# Patient Record
Sex: Female | Born: 1935 | Race: White | Hispanic: No | Smoking: Former smoker
Health system: Southern US, Community
[De-identification: ages and names within clinical notes are randomized; demographics above are authoritative.]

## PROBLEM LIST (undated history)

## (undated) DIAGNOSIS — N183 Chronic kidney disease, stage 3 unspecified: Secondary | ICD-10-CM

## (undated) DIAGNOSIS — F419 Anxiety disorder, unspecified: Secondary | ICD-10-CM

## (undated) DIAGNOSIS — M797 Fibromyalgia: Secondary | ICD-10-CM

## (undated) DIAGNOSIS — R570 Cardiogenic shock: Secondary | ICD-10-CM

## (undated) DIAGNOSIS — E785 Hyperlipidemia, unspecified: Secondary | ICD-10-CM

## (undated) DIAGNOSIS — K219 Gastro-esophageal reflux disease without esophagitis: Secondary | ICD-10-CM

## (undated) DIAGNOSIS — K449 Diaphragmatic hernia without obstruction or gangrene: Secondary | ICD-10-CM

## (undated) DIAGNOSIS — K589 Irritable bowel syndrome without diarrhea: Secondary | ICD-10-CM

## (undated) DIAGNOSIS — D72829 Elevated white blood cell count, unspecified: Secondary | ICD-10-CM

## (undated) DIAGNOSIS — D649 Anemia, unspecified: Secondary | ICD-10-CM

## (undated) DIAGNOSIS — I350 Nonrheumatic aortic (valve) stenosis: Secondary | ICD-10-CM

## (undated) DIAGNOSIS — E079 Disorder of thyroid, unspecified: Secondary | ICD-10-CM

## (undated) DIAGNOSIS — I5042 Chronic combined systolic (congestive) and diastolic (congestive) heart failure: Secondary | ICD-10-CM

## (undated) DIAGNOSIS — K5730 Diverticulosis of large intestine without perforation or abscess without bleeding: Secondary | ICD-10-CM

## (undated) DIAGNOSIS — Z515 Encounter for palliative care: Secondary | ICD-10-CM

## (undated) DIAGNOSIS — E1149 Type 2 diabetes mellitus with other diabetic neurological complication: Secondary | ICD-10-CM

## (undated) DIAGNOSIS — I2510 Atherosclerotic heart disease of native coronary artery without angina pectoris: Secondary | ICD-10-CM

## (undated) DIAGNOSIS — Z86010 Personal history of colonic polyps: Secondary | ICD-10-CM

## (undated) DIAGNOSIS — K648 Other hemorrhoids: Secondary | ICD-10-CM

## (undated) HISTORY — DX: Nonrheumatic aortic (valve) stenosis: I35.0

## (undated) HISTORY — DX: Disorder of thyroid, unspecified: E07.9

## (undated) HISTORY — DX: Diverticulosis of large intestine without perforation or abscess without bleeding: K57.30

## (undated) HISTORY — DX: Other hemorrhoids: K64.8

## (undated) HISTORY — PX: CATARACT EXTRACTION: SUR2

## (undated) HISTORY — DX: Type 2 diabetes mellitus with other diabetic neurological complication: E11.49

## (undated) HISTORY — DX: Irritable bowel syndrome without diarrhea: K58.9

## (undated) HISTORY — DX: Chronic combined systolic (congestive) and diastolic (congestive) heart failure: I50.42

## (undated) HISTORY — DX: Gastro-esophageal reflux disease without esophagitis: K21.9

## (undated) HISTORY — PX: THYROIDECTOMY, PARTIAL: SHX18

## (undated) HISTORY — DX: Hyperlipidemia, unspecified: E78.5

## (undated) HISTORY — DX: Atherosclerotic heart disease of native coronary artery without angina pectoris: I25.10

## (undated) HISTORY — DX: Personal history of colonic polyps: Z86.010

## (undated) HISTORY — DX: Diaphragmatic hernia without obstruction or gangrene: K44.9

## (undated) HISTORY — PX: ABDOMINAL HYSTERECTOMY: SHX81

## (undated) HISTORY — PX: HERNIA REPAIR: SHX51

## (undated) HISTORY — DX: Anxiety disorder, unspecified: F41.9

---

## 1999-02-22 ENCOUNTER — Encounter: Admission: RE | Admit: 1999-02-22 | Discharge: 1999-05-23 | Payer: Self-pay | Admitting: Family Medicine

## 1999-03-01 ENCOUNTER — Ambulatory Visit (HOSPITAL_COMMUNITY): Admission: RE | Admit: 1999-03-01 | Discharge: 1999-03-01 | Payer: Self-pay | Admitting: Family Medicine

## 1999-09-23 ENCOUNTER — Encounter: Admission: RE | Admit: 1999-09-23 | Discharge: 1999-12-22 | Payer: Self-pay | Admitting: Family Medicine

## 2000-01-27 ENCOUNTER — Ambulatory Visit (HOSPITAL_COMMUNITY): Admission: RE | Admit: 2000-01-27 | Discharge: 2000-01-27 | Payer: Self-pay | Admitting: Internal Medicine

## 2005-04-11 HISTORY — PX: ESOPHAGOGASTRODUODENOSCOPY: SHX1529

## 2005-04-11 HISTORY — PX: OTHER SURGICAL HISTORY: SHX169

## 2005-07-01 ENCOUNTER — Encounter: Admission: RE | Admit: 2005-07-01 | Discharge: 2005-07-01 | Payer: Self-pay | Admitting: Family Medicine

## 2006-01-06 ENCOUNTER — Ambulatory Visit: Payer: Self-pay | Admitting: Family Medicine

## 2006-01-12 ENCOUNTER — Ambulatory Visit: Payer: Self-pay | Admitting: Internal Medicine

## 2006-01-25 ENCOUNTER — Encounter (INDEPENDENT_AMBULATORY_CARE_PROVIDER_SITE_OTHER): Payer: Self-pay | Admitting: Specialist

## 2006-01-25 ENCOUNTER — Ambulatory Visit: Payer: Self-pay | Admitting: Internal Medicine

## 2006-02-27 ENCOUNTER — Ambulatory Visit: Payer: Self-pay | Admitting: Internal Medicine

## 2006-03-06 ENCOUNTER — Ambulatory Visit: Payer: Self-pay | Admitting: Family Medicine

## 2006-03-06 LAB — CONVERTED CEMR LAB
ALT: 24 units/L (ref 0–40)
Albumin: 3.9 g/dL (ref 3.5–5.2)
Alkaline Phosphatase: 108 units/L (ref 39–117)
Total Bilirubin: 1 mg/dL (ref 0.3–1.2)

## 2006-06-16 ENCOUNTER — Ambulatory Visit: Payer: Self-pay | Admitting: Family Medicine

## 2006-06-16 LAB — CONVERTED CEMR LAB
ALT: 23 units/L (ref 0–40)
AST: 24 units/L (ref 0–37)
Bilirubin, Direct: 0.2 mg/dL (ref 0.0–0.3)
CO2: 26 meq/L (ref 19–32)
Calcium: 9.4 mg/dL (ref 8.4–10.5)
Creatinine,U: 133.8 mg/dL
GFR calc Af Amer: 126 mL/min
Glucose, Bld: 137 mg/dL — ABNORMAL HIGH (ref 70–99)
Sodium: 138 meq/L (ref 135–145)
Total Protein: 7.5 g/dL (ref 6.0–8.3)

## 2006-06-19 ENCOUNTER — Encounter: Payer: Self-pay | Admitting: Family Medicine

## 2006-09-25 DIAGNOSIS — K219 Gastro-esophageal reflux disease without esophagitis: Secondary | ICD-10-CM

## 2006-09-25 DIAGNOSIS — E1149 Type 2 diabetes mellitus with other diabetic neurological complication: Secondary | ICD-10-CM | POA: Insufficient documentation

## 2006-09-25 DIAGNOSIS — K449 Diaphragmatic hernia without obstruction or gangrene: Secondary | ICD-10-CM | POA: Insufficient documentation

## 2006-09-27 ENCOUNTER — Ambulatory Visit: Payer: Self-pay | Admitting: Family Medicine

## 2006-09-27 DIAGNOSIS — R21 Rash and other nonspecific skin eruption: Secondary | ICD-10-CM

## 2006-09-28 ENCOUNTER — Ambulatory Visit: Payer: Self-pay | Admitting: Family Medicine

## 2006-10-03 ENCOUNTER — Encounter: Payer: Self-pay | Admitting: Family Medicine

## 2006-10-12 ENCOUNTER — Telehealth (INDEPENDENT_AMBULATORY_CARE_PROVIDER_SITE_OTHER): Payer: Self-pay | Admitting: *Deleted

## 2007-01-05 ENCOUNTER — Ambulatory Visit: Payer: Self-pay | Admitting: Family Medicine

## 2007-01-08 ENCOUNTER — Encounter: Payer: Self-pay | Admitting: Family Medicine

## 2007-04-12 DIAGNOSIS — K449 Diaphragmatic hernia without obstruction or gangrene: Secondary | ICD-10-CM

## 2007-04-12 DIAGNOSIS — K648 Other hemorrhoids: Secondary | ICD-10-CM

## 2007-04-12 DIAGNOSIS — Z86010 Personal history of colonic polyps: Secondary | ICD-10-CM

## 2007-04-12 DIAGNOSIS — K5730 Diverticulosis of large intestine without perforation or abscess without bleeding: Secondary | ICD-10-CM

## 2007-04-12 HISTORY — DX: Diaphragmatic hernia without obstruction or gangrene: K44.9

## 2007-04-12 HISTORY — PX: LAPAROSCOPIC NISSEN FUNDOPLICATION: SHX1932

## 2007-04-12 HISTORY — DX: Other hemorrhoids: K64.8

## 2007-04-12 HISTORY — DX: Personal history of colonic polyps: Z86.010

## 2007-04-12 HISTORY — DX: Diverticulosis of large intestine without perforation or abscess without bleeding: K57.30

## 2007-04-13 ENCOUNTER — Ambulatory Visit: Payer: Self-pay | Admitting: Family Medicine

## 2007-04-24 ENCOUNTER — Telehealth (INDEPENDENT_AMBULATORY_CARE_PROVIDER_SITE_OTHER): Payer: Self-pay | Admitting: *Deleted

## 2007-04-28 LAB — CONVERTED CEMR LAB
Albumin: 4.2 g/dL (ref 3.5–5.2)
Alkaline Phosphatase: 68 units/L (ref 39–117)
BUN: 23 mg/dL (ref 6–23)
Creatinine, Ser: 0.9 mg/dL (ref 0.4–1.2)
GFR calc Af Amer: 78 mL/min
GFR calc non Af Amer: 65 mL/min
Potassium: 4.7 meq/L (ref 3.5–5.1)
Triglycerides: 139 mg/dL (ref 0–149)
VLDL: 28 mg/dL (ref 0–40)

## 2007-05-11 ENCOUNTER — Ambulatory Visit: Payer: Self-pay | Admitting: Family Medicine

## 2007-05-11 ENCOUNTER — Telehealth: Payer: Self-pay | Admitting: Family Medicine

## 2007-05-11 DIAGNOSIS — L84 Corns and callosities: Secondary | ICD-10-CM | POA: Insufficient documentation

## 2007-05-11 DIAGNOSIS — R1013 Epigastric pain: Secondary | ICD-10-CM

## 2007-05-11 DIAGNOSIS — E1142 Type 2 diabetes mellitus with diabetic polyneuropathy: Secondary | ICD-10-CM

## 2007-05-15 ENCOUNTER — Telehealth (INDEPENDENT_AMBULATORY_CARE_PROVIDER_SITE_OTHER): Payer: Self-pay | Admitting: *Deleted

## 2007-05-17 ENCOUNTER — Telehealth (INDEPENDENT_AMBULATORY_CARE_PROVIDER_SITE_OTHER): Payer: Self-pay | Admitting: *Deleted

## 2007-05-23 ENCOUNTER — Encounter: Payer: Self-pay | Admitting: Family Medicine

## 2007-05-31 ENCOUNTER — Emergency Department (HOSPITAL_COMMUNITY): Admission: EM | Admit: 2007-05-31 | Discharge: 2007-05-31 | Payer: Self-pay | Admitting: Emergency Medicine

## 2007-05-31 ENCOUNTER — Telehealth (INDEPENDENT_AMBULATORY_CARE_PROVIDER_SITE_OTHER): Payer: Self-pay | Admitting: *Deleted

## 2007-06-13 ENCOUNTER — Ambulatory Visit: Payer: Self-pay | Admitting: Internal Medicine

## 2007-06-15 ENCOUNTER — Encounter: Payer: Self-pay | Admitting: Family Medicine

## 2007-06-15 ENCOUNTER — Ambulatory Visit: Payer: Self-pay | Admitting: Internal Medicine

## 2007-06-15 ENCOUNTER — Encounter: Payer: Self-pay | Admitting: Internal Medicine

## 2007-06-20 ENCOUNTER — Ambulatory Visit (HOSPITAL_COMMUNITY): Admission: RE | Admit: 2007-06-20 | Discharge: 2007-06-20 | Payer: Self-pay | Admitting: Internal Medicine

## 2007-06-28 ENCOUNTER — Telehealth (INDEPENDENT_AMBULATORY_CARE_PROVIDER_SITE_OTHER): Payer: Self-pay | Admitting: *Deleted

## 2007-07-02 ENCOUNTER — Ambulatory Visit (HOSPITAL_COMMUNITY): Admission: RE | Admit: 2007-07-02 | Discharge: 2007-07-02 | Payer: Self-pay | Admitting: Internal Medicine

## 2007-07-04 ENCOUNTER — Encounter: Admission: RE | Admit: 2007-07-04 | Discharge: 2007-07-04 | Payer: Self-pay | Admitting: Podiatry

## 2007-07-11 ENCOUNTER — Encounter: Admission: RE | Admit: 2007-07-11 | Discharge: 2007-07-11 | Payer: Self-pay | Admitting: Interventional Radiology

## 2007-07-23 ENCOUNTER — Telehealth (INDEPENDENT_AMBULATORY_CARE_PROVIDER_SITE_OTHER): Payer: Self-pay | Admitting: *Deleted

## 2007-08-06 ENCOUNTER — Ambulatory Visit: Payer: Self-pay | Admitting: Internal Medicine

## 2007-09-24 ENCOUNTER — Ambulatory Visit: Payer: Self-pay | Admitting: Family Medicine

## 2007-09-26 ENCOUNTER — Encounter: Payer: Self-pay | Admitting: Family Medicine

## 2007-09-26 LAB — CONVERTED CEMR LAB
Albumin: 3.9 g/dL (ref 3.5–5.2)
Cholesterol: 247 mg/dL (ref 0–200)
Creatinine,U: 143.4 mg/dL
HDL: 34.6 mg/dL — ABNORMAL LOW (ref >39.0)
Hgb A1c MFr Bld: 7 % — ABNORMAL HIGH (ref 4.6–6.0)
Microalb Creat Ratio: 24.4 mg/g (ref 0.0–30.0)
Total CHOL/HDL Ratio: 7.1
Triglycerides: 287 mg/dL (ref 0–149)
VLDL: 57 mg/dL — ABNORMAL HIGH (ref 0–40)

## 2007-09-27 ENCOUNTER — Encounter (INDEPENDENT_AMBULATORY_CARE_PROVIDER_SITE_OTHER): Payer: Self-pay | Admitting: *Deleted

## 2007-10-03 ENCOUNTER — Telehealth (INDEPENDENT_AMBULATORY_CARE_PROVIDER_SITE_OTHER): Payer: Self-pay | Admitting: *Deleted

## 2007-10-03 ENCOUNTER — Telehealth: Payer: Self-pay | Admitting: Family Medicine

## 2007-10-08 ENCOUNTER — Ambulatory Visit: Payer: Self-pay | Admitting: Family Medicine

## 2007-10-08 DIAGNOSIS — E785 Hyperlipidemia, unspecified: Secondary | ICD-10-CM

## 2007-10-23 ENCOUNTER — Telehealth (INDEPENDENT_AMBULATORY_CARE_PROVIDER_SITE_OTHER): Payer: Self-pay | Admitting: *Deleted

## 2007-10-23 ENCOUNTER — Encounter: Payer: Self-pay | Admitting: Family Medicine

## 2007-10-25 ENCOUNTER — Telehealth: Payer: Self-pay | Admitting: Internal Medicine

## 2007-11-06 ENCOUNTER — Encounter: Admission: RE | Admit: 2007-11-06 | Discharge: 2007-11-06 | Payer: Self-pay | Admitting: Interventional Radiology

## 2007-11-09 ENCOUNTER — Ambulatory Visit: Payer: Self-pay | Admitting: Family Medicine

## 2007-11-28 ENCOUNTER — Ambulatory Visit: Payer: Self-pay | Admitting: Vascular Surgery

## 2007-11-29 ENCOUNTER — Encounter: Payer: Self-pay | Admitting: Family Medicine

## 2008-02-01 ENCOUNTER — Inpatient Hospital Stay (HOSPITAL_COMMUNITY): Admission: RE | Admit: 2008-02-01 | Discharge: 2008-02-06 | Payer: Self-pay | Admitting: Surgery

## 2008-05-08 ENCOUNTER — Encounter: Payer: Self-pay | Admitting: Family Medicine

## 2008-06-18 ENCOUNTER — Ambulatory Visit: Payer: Self-pay | Admitting: Vascular Surgery

## 2008-10-15 ENCOUNTER — Ambulatory Visit: Payer: Self-pay | Admitting: Vascular Surgery

## 2009-01-28 ENCOUNTER — Ambulatory Visit: Payer: Self-pay | Admitting: Vascular Surgery

## 2009-01-30 ENCOUNTER — Encounter: Payer: Self-pay | Admitting: Family Medicine

## 2009-07-29 ENCOUNTER — Ambulatory Visit: Payer: Self-pay | Admitting: Vascular Surgery

## 2009-08-11 ENCOUNTER — Ambulatory Visit: Payer: Self-pay | Admitting: Family Medicine

## 2009-08-11 LAB — HM COLONOSCOPY

## 2009-08-11 LAB — HM DIABETES FOOT EXAM

## 2009-08-13 ENCOUNTER — Encounter: Payer: Self-pay | Admitting: Family Medicine

## 2009-09-28 ENCOUNTER — Telehealth: Payer: Self-pay | Admitting: Family Medicine

## 2009-11-25 ENCOUNTER — Telehealth (INDEPENDENT_AMBULATORY_CARE_PROVIDER_SITE_OTHER): Payer: Self-pay | Admitting: *Deleted

## 2009-11-30 ENCOUNTER — Ambulatory Visit: Payer: Self-pay | Admitting: Family Medicine

## 2009-12-11 ENCOUNTER — Ambulatory Visit: Payer: Self-pay | Admitting: Family Medicine

## 2009-12-29 ENCOUNTER — Encounter: Payer: Self-pay | Admitting: Family Medicine

## 2009-12-29 ENCOUNTER — Telehealth: Payer: Self-pay | Admitting: Family Medicine

## 2010-02-04 ENCOUNTER — Encounter: Payer: Self-pay | Admitting: Family Medicine

## 2010-03-12 ENCOUNTER — Ambulatory Visit: Payer: Self-pay | Admitting: Vascular Surgery

## 2010-05-09 LAB — CONVERTED CEMR LAB
ALT: 13 units/L (ref 0–35)
ALT: 20 units/L (ref 0–35)
ALT: 23 units/L (ref 0–35)
AST: 16 units/L (ref 0–37)
AST: 17 units/L (ref 0–37)
AST: 22 units/L (ref 0–37)
Albumin: 3.7 g/dL (ref 3.5–5.2)
Alkaline Phosphatase: 115 units/L (ref 39–117)
Alkaline Phosphatase: 71 units/L (ref 39–117)
BUN: 11 mg/dL (ref 6–23)
BUN: 9 mg/dL (ref 6–23)
Basophils Absolute: 0 10*3/uL (ref 0.0–0.1)
Basophils Absolute: 0.1 10*3/uL (ref 0.0–0.1)
Bilirubin, Direct: 0.1 mg/dL (ref 0.0–0.3)
Bilirubin, Direct: 0.1 mg/dL (ref 0.0–0.3)
Bilirubin, Direct: 0.1 mg/dL (ref 0.0–0.3)
Blood in Urine, dipstick: NEGATIVE
CO2: 28 meq/L (ref 19–32)
CO2: 28 meq/L (ref 19–32)
Calcium: 9.2 mg/dL (ref 8.4–10.5)
Calcium: 9.3 mg/dL (ref 8.4–10.5)
Chloride: 102 meq/L (ref 96–112)
Chloride: 106 meq/L (ref 96–112)
Direct LDL: 111.8 mg/dL
Eosinophils Absolute: 0.2 10*3/uL (ref 0.0–0.6)
Eosinophils Relative: 1.8 % (ref 0.0–5.0)
GFR calc Af Amer: 105 mL/min
GFR calc non Af Amer: 127 mL/min (ref >60)
GFR calc non Af Amer: 75 mL/min
GFR calc non Af Amer: 87 mL/min
Glucose, Bld: 153 mg/dL — ABNORMAL HIGH (ref 70–99)
HCT: 40.9 % (ref 36.0–46.0)
HDL: 40.8 mg/dL (ref >39.00)
Hemoglobin: 13.5 g/dL (ref 12.0–15.0)
Hgb A1c MFr Bld: 10.3 % — ABNORMAL HIGH (ref 4.6–6.5)
Hgb A1c MFr Bld: 6.7 % — ABNORMAL HIGH (ref 4.6–6.5)
Ketones, urine, test strip: NEGATIVE
Lymphocytes Relative: 26.1 % (ref 12.0–46.0)
Lymphocytes Relative: 35.2 % (ref 12.0–46.0)
Lymphs Abs: 2.6 10*3/uL (ref 0.7–4.0)
MCHC: 33.2 g/dL (ref 30.0–36.0)
Microalb Creat Ratio: 93.8 mg/g — ABNORMAL HIGH (ref 0.0–30.0)
Microalb, Ur: 2.6 mg/dL — ABNORMAL HIGH (ref 0.0–1.9)
Monocytes Absolute: 0.6 10*3/uL (ref 0.2–0.7)
Monocytes Relative: 7.7 % (ref 3.0–11.0)
Monocytes Relative: 8.3 % (ref 3.0–12.0)
Neutro Abs: 5.1 10*3/uL (ref 1.4–7.7)
Nitrite: NEGATIVE
Platelets: 275 10*3/uL (ref 150.0–400.0)
Potassium: 4.1 meq/L (ref 3.5–5.1)
Potassium: 4.6 meq/L (ref 3.5–5.1)
Potassium: 4.7 meq/L (ref 3.5–5.1)
Protein, U semiquant: NEGATIVE
RDW: 14.5 % (ref 11.5–14.6)
Sodium: 138 meq/L (ref 135–145)
Sodium: 138 meq/L (ref 135–145)
Total Bilirubin: 0.4 mg/dL (ref 0.3–1.2)
Total Bilirubin: 0.6 mg/dL (ref 0.3–1.2)
Total Protein: 7.1 g/dL (ref 6.0–8.3)
Triglycerides: 527 mg/dL — ABNORMAL HIGH (ref 0.0–149.0)
VLDL: 105.4 mg/dL — ABNORMAL HIGH (ref 0.0–40.0)
WBC: 7.5 10*3/uL (ref 4.5–10.5)
pH: 7

## 2010-05-11 NOTE — Progress Notes (Signed)
Summary: FENOFIBRATE  Phone Note Call from Patient   Caller: Patient Summary of Call: PT STATES FENOFIBRATE GIVES HER A HEADACHE SO SHE IS UNABLE TO TAKE IT. PLEASE ADVISE. [phone]. Initial call taken by: Lavell Islam,  December 29, 2009 3:58 PM  Follow-up for Phone Call        lovaza 1 g 2 by mouth two times a day #120 2 refills or she can try otc fish oil 4 g a day Follow-up by: Loreen Freud DO,  December 29, 2009 4:42 PM  Additional Follow-up for Phone Call Additional follow up Details #1::        spk with pt and she would rather take the Fish oil pills 4g a day. Rx not called to the pharmacy. Additional Follow-up by: Almeta Monas CMA Duncan Dull),  December 29, 2009 5:03 PM    New/Updated Medications: GNP FISH OIL 435 MG CAPS (OMEGA-3 FATTY ACIDS)

## 2010-05-11 NOTE — Assessment & Plan Note (Signed)
Summary: CLEARANCE FOR SURGERY FOR HER EYES--PH   Vital Signs:  Patient profile:   77 year old female Height:      64 inches Weight:      131 pounds BMI:     22.57 Pulse rate:   76 / minute Pulse rhythm:   regular BP sitting:   126 / 78  (left arm) Cuff size:   regular  Vitals Entered By: Army Fossa CMA (Aug 11, 2009 8:21 AM) CC: Pt here for Clearance for Surgery- she States she stopped every medication a long time ago. They caused her hair to fall out, and joints to hurt., Pre-op Evaluation CBG Result 256   History of Present Illness:  Pre-op Evaluation      I was asked to see this delightful patient today for Pre-op Evaluation.  Pt here to have presurgical clearance for cataract surgery with [facility].  The patient denies respiratory symptoms, GI bleeding, chest pain, edema, PND, heavy ETOH use, and smoking.  Patient has no history of acute or recent MI, unstable or severe angina, decompensated CHF, high grade AV block, symptomatic ventricular arrhythmia, and severe valvular disease.  Positive PMH placing the patient at moderate risk for surgery includes diabetes(m) and advanced age(l).  Conditions requiring action prior to surgery include antiplatelet agents.  There is no history of chronic steroids, warfarin, diabetes meds, antianginal meds, bleeding disorder, and FH anesthesia reaction.  Pt is supposed to be on diabetes and cholesterol meds but has not taken anything in months.  The cholesterol med caused muscle aches.    Preventive Screening-Counseling & Management  Alcohol-Tobacco     Alcohol drinks/day: 0     Smoking Status: never      Sexual History:  widow.        Drug Use:  never.    Current Medications (verified): 1)  Aspirin 81 Mg Tabs (Aspirin) .Marland Kitchen.. 1 By Mouth Once Daily  Allergies: 1)  ! Pcn 2)  ! Codeine 3)  ! Glucophage 4)  ! Actos (Pioglitazone Hcl)  Past History:  Past Medical History: Last updated: 11/09/2007 Diabetes mellitus, type  II GERD Hyperlipidemia Current Problems:  HYPERLIPIDEMIA (ICD-272.4) DIABETIC PERIPHERAL NEUROPATHY (ICD-250.60) CALLUSES, FEET, BILATERAL (ICD-700) ABDOMINAL PAIN, EPIGASTRIC (ICD-789.06) SKIN RASH (ICD-782.1) FAMILY HISTORY DIABETES 1ST DEGREE RELATIVE (ICD-V18.0) HIATAL HERNIA (ICD-553.3) GERD (ICD-530.81) DIABETES MELLITUS, TYPE II (ICD-250.00)  Family History: Last updated: 09/25/2006 Family History of Arthritis Family History Diabetes 1st degree relative  Social History: Last updated: 09/25/2006 Never Smoked Alcohol use-no widowed 3 children  Risk Factors: Alcohol Use: 0 (08/11/2009)  Risk Factors: Smoking Status: never (08/11/2009)  Past Surgical History: Half of Thyroid removed Uterus Removed BMD-2007 EGD-2007 Colon-2007 laproscopic [employer] 2009-- Daphine Deutscher  Family History: Reviewed history from 09/25/2006 and no changes required. Family History of Arthritis Family History Diabetes 1st degree relative  Social History: Reviewed history from 09/25/2006 and no changes required. Never Smoked Alcohol use-no widowed 3 childrenSexual History:  widow Drug Use:  never  Review of Systems      See HPI General:  Denies chills, fatigue, fever, loss of appetite, malaise, sleep disorder, sweats, weakness, and weight loss. Eyes:  Complains of blurring; denies discharge, double vision, eye irritation, eye pain, halos, itching, light sensitivity, red eye, vision loss-1 eye, and vision loss-both eyes; cataract b/l--- surgery will be on right eye. ENT:  Denies decreased hearing, difficulty swallowing, ear discharge, earache, hoarseness, nasal congestion, nosebleeds, postnasal drainage, ringing in ears, sinus pressure, and sore throat. CV:  Complains of  leg cramps with exertion; denies bluish discoloration of lips or nails, chest pain or discomfort, difficulty breathing at night, difficulty breathing while lying down, fainting, fatigue, lightheadness, near fainting,  palpitations, shortness of breath with exertion, swelling of feet, swelling of hands, and weight gain. Resp:  Denies chest discomfort, chest pain with inspiration, cough, coughing up blood, excessive snoring, hypersomnolence, morning headaches, pleuritic, shortness of breath, sputum productive, and wheezing. GI:  Denies abdominal pain, bloody stools, change in bowel habits, constipation, dark tarry stools, diarrhea, excessive appetite, gas, hemorrhoids, indigestion, loss of appetite, and nausea. GU:  Denies incontinence, nocturia, urinary frequency, and urinary hesitancy. MS:  Complains of muscle aches; denies joint pain, joint redness, joint swelling, loss of strength, low back pain, mid back pain, muscle , cramps, muscle weakness, stiffness, and thoracic pain. Derm:  Denies changes in color of skin, changes in nail beds, dryness, excessive perspiration, flushing, hair loss, insect bite(s), itching, lesion(s), poor wound healing, and rash. Neuro:  Complains of numbness; denies brief paralysis, difficulty with concentration, disturbances in coordination, falling down, headaches, inability to speak, memory loss, poor balance, and weakness. Psych:  Denies alternate hallucination ( auditory/visual), anxiety, depression, easily angered, easily tearful, irritability, mental problems, panic attacks, sense of great danger, suicidal thoughts/plans, thoughts of violence, unusual visions or sounds, and thoughts /plans of harming others. Endo:  Denies cold intolerance, excessive hunger, excessive thirst, excessive urination, heat intolerance, polyuria, and weight change. Heme:  Denies abnormal bruising, bleeding, enlarge lymph nodes, fevers, pallor, and skin discoloration. Allergy:  Denies hives or rash, itching eyes, persistent infections, seasonal allergies, and sneezing.  Physical Exam  General:  Well-developed,well-nourished,in no acute distress; alert,appropriate and cooperative throughout examination Head:   Normocephalic and atraumatic without obvious abnormalities. No apparent alopecia or balding. Eyes:  pupils equal, pupils round, and pupils reactive to light.  b/l cataracts Ears:  External ear exam shows no significant lesions or deformities.  Otoscopic examination reveals clear canals, tympanic membranes are intact bilaterally without bulging, retraction, inflammation or discharge. Hearing is grossly normal bilaterally. Nose:  External nasal examination shows no deformity or inflammation. Nasal mucosa are pink and moist without lesions or exudates. Mouth:  Oral mucosa and oropharynx without lesions or exudates.  Teeth in good repair. Neck:  No deformities, masses, or tenderness noted. Lungs:  Normal respiratory effort, chest expands symmetrically. Lungs are clear to auscultation, no crackles or wheezes. Heart:  normal rate and no murmur.   Abdomen:  Bowel sounds positive,abdomen soft and non-tender without masses, organomegaly or hernias noted. Msk:  normal ROM, no joint tenderness, no joint swelling, no joint warmth, no redness over joints, no joint deformities, no joint instability, and no crepitation.   Pulses:  R posterior tibial normal, R dorsalis pedis normal, R carotid normal, L posterior tibial normal, L dorsalis pedis normal, and L carotid normal.   Extremities:  No clubbing, cyanosis, edema, or deformity noted with normal full range of motion of all joints.   Skin:  Intact without suspicious lesions or rashes Cervical Nodes:  No lymphadenopathy noted Psych:  Oriented X3 and normally interactive.    Diabetes Management Exam:    Foot Exam (with socks and/or shoes not present):       Sensory-Pinprick/Light touch:          Left medial foot (L-4): normal          Left dorsal foot (L-5): normal          Left lateral foot (S-1): normal  Right medial foot (L-4): normal          Right dorsal foot (L-5): normal          Right lateral foot (S-1): normal       Sensory-Monofilament:           Left foot: normal          Right foot: normal       Inspection:          Left foot: normal          Right foot: normal       Nails:          Left foot: normal          Right foot: normal   Impression & Recommendations:  Problem # 1:  PREOPERATIVE EXAMINATION (ICD-V72.84)  We need to wait for labs before clearance can be done--  Pt has been off all meds for almost 2 years---I did discuss this with her and she is willing to start back on them if needed  Orders: EKG w/ Interpretation (93000)  Problem # 2:  HYPERLIPIDEMIA (ICD-272.4)  The following medications were removed from the medication list:    Fenofibrate 160 Mg Tabs (Fenofibrate) .Marland Kitchen... 1 by mouth once daily  Orders: Venipuncture (16109) EKG w/ Interpretation (93000) TLB-Lipid Panel (80061-LIPID) TLB-BMP (Basic Metabolic Panel-BMET) (80048-METABOL) TLB-CBC Platelet - w/Differential (85025-CBCD) TLB-Hepatic/Liver Function Pnl (80076-HEPATIC) TLB-A1C / Hgb A1C (Glycohemoglobin) (83036-A1C) TLB-Microalbumin/Creat Ratio, Urine (82043-MALB)  Problem # 3:  DIABETES MELLITUS, TYPE II (ICD-250.00)  The following medications were removed from the medication list:    Altace 2.5 Mg Caps (Ramipril) .Marland Kitchen... Take 1 capsule by mouth once a day    Amaryl 4 Mg Tabs (Glimepiride) .Marland Kitchen..Marland Kitchen Two times a day    Bayer Aspirin Ec Low Dose 81 Mg Tbec (Aspirin) ..... One daily    Januvia 100 Mg Tabs (Sitagliptin phosphate) .Marland Kitchen... 1 by mouth once daily Her updated medication list for this problem includes:    Aspirin 81 Mg Tabs (Aspirin) .Marland Kitchen... 1 by mouth once daily  Orders: Venipuncture (60454) EKG w/ Interpretation (93000) TLB-Lipid Panel (80061-LIPID) TLB-BMP (Basic Metabolic Panel-BMET) (80048-METABOL) TLB-CBC Platelet - w/Differential (85025-CBCD) TLB-Hepatic/Liver Function Pnl (80076-HEPATIC) TLB-A1C / Hgb A1C (Glycohemoglobin) (83036-A1C) TLB-Microalbumin/Creat Ratio, Urine (82043-MALB)  Labs Reviewed: Creat: 0.9 (04/13/2007)     Reviewed HgBA1c results: 7.0 (09/24/2007)  6.3 (04/13/2007)  Problem # 4:  DIABETIC PERIPHERAL NEUROPATHY (ICD-250.60)  The following medications were removed from the medication list:    Altace 2.5 Mg Caps (Ramipril) .Marland Kitchen... Take 1 capsule by mouth once a day    Amaryl 4 Mg Tabs (Glimepiride) .Marland Kitchen..Marland Kitchen Two times a day    Bayer Aspirin Ec Low Dose 81 Mg Tbec (Aspirin) ..... One daily    Januvia 100 Mg Tabs (Sitagliptin phosphate) .Marland Kitchen... 1 by mouth once daily Her updated medication list for this problem includes:    Aspirin 81 Mg Tabs (Aspirin) .Marland Kitchen... 1 by mouth once daily  Labs Reviewed: Creat: 0.9 (04/13/2007)    Reviewed HgBA1c results: 7.0 (09/24/2007)  6.3 (04/13/2007)  Complete Medication List: 1)  Aspirin 81 Mg Tabs (Aspirin) .Marland Kitchen.. 1 by mouth once daily  Other Orders: Specimen Handling (09811) T-Culture, Urine (91478-29562) UA Dipstick w/o Micro (manual) (13086)   EKG  Procedure date:  08/11/2009  Findings:      Normal sinus rhythm with rate of:  75 bpm    Flu Vaccine Next Due:  Refused Pneumovax Next Due:  Refused Herpes Zoster Next Due:  Refused  Colonoscopy Result Date:  06/15/2007 Colonoscopy Result:  polyps Colonoscopy Next Due:  3 yr   Laboratory Results   Urine Tests    Routine Urinalysis   Color: yellow Appearance: Cloudy Glucose: negative   (Normal Range: Negative) Bilirubin: negative   (Normal Range: Negative) Ketone: negative   (Normal Range: Negative) Spec. Gravity: <1.005   (Normal Range: 1.003-1.035) Blood: negative   (Normal Range: Negative) pH: 7.0   (Normal Range: 5.0-8.0) Protein: negative   (Normal Range: Negative) Urobilinogen: 0.2   (Normal Range: 0-1) Nitrite: negative   (Normal Range: Negative) Leukocyte Esterace: large   (Normal Range: Negative)    Comments: Floydene Flock  Aug 11, 2009 10:05 AM cx sent  Blood Tests     CBG Random:: 256mg /dL         Laboratory Results   Urine Tests    Routine Urinalysis     Color: yellow Appearance: Cloudy Glucose: negative   (Normal Range: Negative) Bilirubin: negative   (Normal Range: Negative) Ketone: negative   (Normal Range: Negative) Spec. Gravity: <1.005   (Normal Range: 1.003-1.035) Blood: negative   (Normal Range: Negative) pH: 7.0   (Normal Range: 5.0-8.0) Protein: negative   (Normal Range: Negative) Urobilinogen: 0.2   (Normal Range: 0-1) Nitrite: negative   (Normal Range: Negative) Leukocyte Esterace: large   (Normal Range: Negative)    Comments: Floydene Flock  Aug 11, 2009 10:05 AM cx sent  Blood Tests     CBG Random: 256

## 2010-05-11 NOTE — Progress Notes (Signed)
Summary: samples or refill  Phone Note Call from Patient Call back at Home Phone [phone]   Caller: Patient Summary of Call: patient  was given 1 month supply of crestor - she wants to know if we are going to supply samples or call in to -  [facility] [place]   Initial call taken by: Okey Regal Spring,  September 28, 2009 11:09 AM  Follow-up for Phone Call        It looks like rx sent last month?  can send again if she did not receive it.   Follow-up by: Loreen Freud DO,  September 28, 2009 11:23 AM  Additional Follow-up for Phone Call Additional follow up Details #1::        Pt is aware rx is at [facility]. Army Fossa CMA  September 28, 2009 11:25 AM     Prescriptions: CRESTOR 5 MG TABS (ROSUVASTATIN CALCIUM) 1 by mouth every other day.  #15 x 2   Entered by:   Army Fossa CMA   Authorized by:   Loreen Freud DO   Signed by:   Army Fossa CMA on 09/28/2009   Method used:   Electronically to        [facility] (retail)       [address]       [place], [place]  [address]       Ph: [phone]       Fax: [phone]   RxID:   [id number]

## 2010-05-11 NOTE — Letter (Signed)
Summary: Surgical Clearance/[facility]  Surgical Clearance/[facility]   Imported By: Lanelle Bal 08/18/2009 13:18:32  _____________________________________________________________________  External Attachment:    Type:   Image     Comment:   External Document

## 2010-05-11 NOTE — Letter (Signed)
Summary: Surgical Clearance/[facility]  Surgical Clearance/[facility]   Imported By: Lanelle Bal 01/15/2010 11:19:42  _____________________________________________________________________  External Attachment:    Type:   Image     Comment:   External Document

## 2010-05-11 NOTE — Letter (Signed)
Summary: Surgical Clearance/[facility]  Surgical Clearance/[facility]   Imported By: Lanelle Bal 12/24/2009 08:52:08  _____________________________________________________________________  External Attachment:    Type:   Image     Comment:   External Document

## 2010-05-11 NOTE — Assessment & Plan Note (Signed)
Summary: rto review lab/cbs   Vital Signs:  Patient profile:   77 year old female Weight:      130.2 pounds Temp:     98.2 degrees F oral Pulse rate:   76 / minute Pulse rhythm:   regular BP sitting:   122 / 70  (left arm)  Vitals Entered By: Almeta Monas CMA Duncan Dull) (December 11, 2009 10:30 AM) CC: f/u and fasting labs   History of Present Illness: Pt here to review labs and would like to know if she can have her eye surgery now.  Current Medications (verified): 1)  Aspirin 81 Mg Tabs (Aspirin) .Marland Kitchen.. 1 By Mouth Once Daily 2)  Amaryl 4 Mg Tabs (Glimepiride) .Marland Kitchen.. 1 By Mouth Two Times A Day. 3)  Januvia 100 Mg Tabs (Sitagliptin Phosphate) .Marland Kitchen.. 1 By Mouth Once Daily. 4)  Crestor 5 Mg Tabs (Rosuvastatin Calcium) .Marland Kitchen.. 1 By Mouth Every Other Day. 5)  Freestyle Lite Test  Strp (Glucose Blood) .... Use As Directed.  Allergies (verified): 1)  ! Pcn 2)  ! Codeine 3)  ! Glucophage 4)  ! Actos (Pioglitazone Hcl)  Past History:  Past medical, surgical, family and social histories (including risk factors) reviewed for relevance to current acute and chronic problems.  Past Medical History: Reviewed history from 11/09/2007 and no changes required. Diabetes mellitus, type II GERD Hyperlipidemia Current Problems:  HYPERLIPIDEMIA (ICD-272.4) DIABETIC PERIPHERAL NEUROPATHY (ICD-250.60) CALLUSES, FEET, BILATERAL (ICD-700) ABDOMINAL PAIN, EPIGASTRIC (ICD-789.06) SKIN RASH (ICD-782.1) FAMILY HISTORY DIABETES 1ST DEGREE RELATIVE (ICD-V18.0) HIATAL HERNIA (ICD-553.3) GERD (ICD-530.81) DIABETES MELLITUS, TYPE II (ICD-250.00)  Past Surgical History: Reviewed history from 08/11/2009 and no changes required. Half of Thyroid removed Uterus Removed BMD-2007 EGD-2007 Colon-2007 laproscopic [employer] 2009-- Daphine Deutscher  Family History: Reviewed history from 09/25/2006 and no changes required. Family History of Arthritis Family History Diabetes 1st degree relative  Social History: Reviewed  history from 09/25/2006 and no changes required. Never Smoked Alcohol use-no widowed 3 children  Review of Systems      See HPI  Physical Exam  General:  Well-developed,well-nourished,in no acute distress; alert,appropriate and cooperative throughout examination Psych:  Oriented X3 and normally interactive.     Impression & Recommendations:  Problem # 1:  HYPERLIPIDEMIA (ICD-272.4) Assessment Improved  Her updated medication list for this problem includes:    Crestor 5 Mg Tabs (Rosuvastatin calcium) .Marland Kitchen... 1 by mouth every other day.  Labs Reviewed: SGOT: 16 (11/30/2009)   SGPT: 13 (11/30/2009)   HDL:40.80 (08/11/2009), 34.6 (09/24/2007)  LDL:DEL (09/24/2007), 79 (47/82/9562)  Chol:296 (08/11/2009), 247 (09/24/2007)  Trig:527.0 (08/11/2009), 287 (09/24/2007)  Problem # 2:  DIABETES MELLITUS, TYPE II (ICD-250.00) Assessment: Improved  Her updated medication list for this problem includes:    Aspirin 81 Mg Tabs (Aspirin) .Marland Kitchen... 1 by mouth once daily    Amaryl 4 Mg Tabs (Glimepiride) .Marland Kitchen... 1 by mouth two times a day.    Januvia 100 Mg Tabs (Sitagliptin phosphate) .Marland Kitchen... 1 by mouth once daily.  Labs Reviewed: Creat: 0.6 (11/30/2009)    Reviewed HgBA1c results: 6.7 (11/30/2009)  10.3 (08/11/2009)  Complete Medication List: 1)  Aspirin 81 Mg Tabs (Aspirin) .Marland Kitchen.. 1 by mouth once daily 2)  Amaryl 4 Mg Tabs (Glimepiride) .Marland Kitchen.. 1 by mouth two times a day. 3)  Januvia 100 Mg Tabs (Sitagliptin phosphate) .Marland Kitchen.. 1 by mouth once daily. 4)  Crestor 5 Mg Tabs (Rosuvastatin calcium) .Marland Kitchen.. 1 by mouth every other day. 5)  Freestyle Lite Test Strp (Glucose blood) .... Use as directed.  Patient Instructions: 1)  Please schedule a follow-up appointment in 6 months -- labs and ov 2)  You are cleared for your eye surgery--- good job!!

## 2010-05-11 NOTE — Letter (Signed)
Summary: Surgical Clearance/[facility]  Surgical Clearance/[facility]   Imported By: Lanelle Bal 02/16/2010 14:17:42  _____________________________________________________________________  External Attachment:    Type:   Image     Comment:   External Document

## 2010-05-11 NOTE — Progress Notes (Signed)
Summary: Samples or alternate med/Labs  Phone Note Call from Patient   Caller: Patient Details for Reason: samples and appt Summary of Call: Pt called and requested samples of Januvia and Cresto per pt meds are too expensive, she also wanted to know when next appt was due....Marland KitchenC/b [phone] Initial call taken by: Almeta Monas CMA Duncan Dull),  November 25, 2009 3:46 PM  Follow-up for Phone Call        I have laid out samples of each for the patient, is there cheaper alternative that would be ok for her? Also, it appears that the pt id due for labs. Please advise. Lucious Groves CMA  November 25, 2009 3:56 PM   Additional Follow-up for Phone Call Additional follow up Details #1::        pt due for labs and ov---272.4  250.00  bmp, hgba1c, lipid, hep, microalbumin----schedule ov about 10 days after labs drawn Additional Follow-up by: Loreen Freud DO,  November 25, 2009 7:15 PM    Additional Follow-up for Phone Call Additional follow up Details #2::    Spk with pt adv we have samples avail for her and we will leave them at check in, also adv labs are due now and we will schedule CPX 10 days after her labs are drawn. Pt voiced understanding, call xfer'd to appts to schedule..... Almeta Monas CMA Duncan Dull)  November 26, 2009 8:41 AM

## 2010-05-11 NOTE — Consult Note (Signed)
Summary: [facility]  [employer].   Imported By: Freddy Jaksch 05/25/2007 09:59:00  _____________________________________________________________________  External Attachment:    Type:   Image     Comment:   External Document

## 2010-05-12 ENCOUNTER — Telehealth (INDEPENDENT_AMBULATORY_CARE_PROVIDER_SITE_OTHER): Payer: Self-pay | Admitting: *Deleted

## 2010-05-19 NOTE — Progress Notes (Signed)
Summary: refill  Phone Note Refill Request Message from:  Fax from Pharmacy on May 12, 2010 4:40 PM  Refills Requested: Medication #1:  AMARYL 4 MG TABS 1 by mouth two times a day. [facility] - fax [phone] - phone [phone]   Initial call taken by: Mittie Bodo 4:41 PM    Prescriptions: AMARYL 4 MG TABS (GLIMEPIRIDE) 1 by mouth two times a day.  #60 Each x 0   Entered by:   Almeta Monas CMA (AAMA)   Authorized by:   Loreen Freud DO   Signed by:   Almeta Monas CMA (AAMA) on 05/13/2010   Method used:   [facility]ed to ...       [facility]       [address]       [place], [place]  [address]       Ph: [phone]       Fax: [phone]   RxID:   [id number]

## 2010-06-30 ENCOUNTER — Other Ambulatory Visit: Payer: Self-pay | Admitting: Family Medicine

## 2010-07-13 ENCOUNTER — Other Ambulatory Visit: Payer: Self-pay | Admitting: Family Medicine

## 2010-07-29 ENCOUNTER — Other Ambulatory Visit: Payer: Self-pay | Admitting: Family Medicine

## 2010-08-24 NOTE — Assessment & Plan Note (Signed)
OFFICE VISIT   [name], [name]  DOB:  1929-10-27                                       10/15/2008  UEAVW#:[id number]   The patient is a 77 year old female who presents for further followup.  We have been following her for claudication symptoms.  She states that  recently she just returned from a cruise with her daughter in [place].  She states that she was able to walk long distances during the day with  minimal difficulty.  She states that at home she currently experiences  two to three block claudication symptoms, left leg greater than the  right.  She also has numbness in her feet bilaterally consistent with  neuropathy.  She denies any rest pain.  She has no nonhealing  ulcerations.  Previous review of her studies suggests that she would  most likely only be a candidate for tibial bypass if necessary.  We have  been delaying any revascularization procedure unless she develops  critical limb ischemia due to this fact.   PHYSICAL EXAMINATION:  On physical exam today blood pressure is 149/76  in the left arm, pulse is 80 and regular.  Lower extremity:  Exam shows  2+ femoral pulses bilaterally.  She has absent popliteal and pedal  pulses bilaterally.  She has no ulcerations on the feet.  She has no  edema.  Feet are pink, warm and well-perfused.   Overall I believe the patient is doing fairly well.  Her symptoms are  fairly minimal in nature.  She certainly is not at risk of limb loss at  this point.  She will follow up with me in 3 months' time for repeat  ABIs or sooner if her symptoms become more debilitating.   Janetta Hora. Fields, MD  Electronically Signed   CEF/MEDQ  D:  10/15/2008  T:  10/16/2008  Job:  [id number]   cc:   Lelon Perla, DO

## 2010-08-24 NOTE — Assessment & Plan Note (Signed)
OFFICE VISIT   [name], [name]  DOB:  [birth year]                                       06/18/2008  ZOXWR#:[id number]   The patient is a 77 year old female referred for evaluation of lower  extremity occlusive disease.  She was previously seen in August of 2009.  At that time she was noted to have bilateral superficial femoral artery,  tibial artery and popliteal artery occlusive disease.  Her symptoms were  fairly minimal in character at that time and it was decided that risk  factor modification and a walking program would be in her best interest  rather than a tibial bypass for claudication symptoms alone.   She presents today describing that she now has claudication symptoms at  approximately 1 block.  She states that she is fairly debilitated by  this.  She also has begun to develop rest pain in her feet at night  time.  She states that the left leg is worse than the right.  She has  also recently developed an ulceration on the dorsum of the left toe.  She states that she is getting better fitting shoes to prevent this.   MEDICATIONS:  The patient states the only medication she takes is  aspirin 81 mg once a day.   On physical exam blood pressure is 118/78 in the right arm.  Lower  extremities, she has 2+ femoral pulses bilaterally.  She has absent  popliteal and pedal pulses bilaterally.  She has a very superficial  ulceration over the dorsum of the left first toe.  There is no erythema.  The extremities have no significant edema.  There are no other open  wounds on the legs.   She had bilateral ABIs performed today which were noncompressible on the  right side but with monophasic waveforms in the peroneal, posterior  tibial and dorsalis pedis and dampened digit pressures.  ABI on the left  was 0.55, again with monophasic waveforms and dampened digit pressure.   In summary, the patient has multilevel occlusive disease which is in  both lower  extremities.  She has had progression of her symptoms.  She  now seems to be experiencing some early rest pain and also has fairly  short distance claudication which is debilitating to her.  I reviewed  her previous CT angiogram findings with the patient today.  She would  only be a candidate for a tibial bypass.  At her previous office visit I  did not think that her symptoms were severe enough to warrant this.  However, she has had progression of her symptoms and may need to have  the idea of tibial bypass revisited especially in light of the fact that  her ABI is fairly decreased on the left side and right side with  dampened waveforms and now she has an ulceration on the left first toe.  I discussed all this with the patient today.  I informed her that if the  ulceration continues to progress or has difficulty healing she should  call me as soon as possible and we will consider doing an arteriogram to  further define the arterial anatomy in both lower extremities.  If the  ulcer heals on its own we will maintain her on a very close followup for  right now with repeat ABIs in three months' time.  [name] Hora. Fields, MD  Electronically Signed   CEF/MEDQ  D:  06/19/2008  T:  06/19/2008  Job:  [id number]   cc:   Lelon Perla, DO

## 2010-08-24 NOTE — Op Note (Signed)
NAMECREASIE, LACOSSE                ACCOUNT NO.:  [id number]   MEDICAL RECORD NO.:  [id number]          PATIENT TYPE:  AMB   LOCATION:  DAY                          FACILITY:  [facility]   PHYSICIAN:  Thornton Park. Daphine Deutscher, MD  DATE OF BIRTH:  December 08, 1929   DATE OF PROCEDURE:  02/01/2008  DATE OF DISCHARGE:                               OPERATIVE REPORT   PREOPERATIVE DIAGNOSES:  GERD (gastroesophageal reflux disease),  enlarged type 3 mixed hiatal hernia.   POSTOPERATIVE DIAGNOSES:  GERD (gastroesophageal reflux disease),  enlarged type 3 mixed hiatal hernia.   PROCEDURES:  Laparoscopic takedown of hiatal hernia which is a type 3  mixed, upper endoscopy per Dr. Daphine Deutscher to validate the anatomy, repair of  hiatal hernia with three posteriorly placed pledgeted sutures and a  three suture wrap around a #50 lighted bougie.   SURGEON:  Thornton Park. Daphine Deutscher, MD   ASSISTANT:  Adolph Pollack, MD   ANESTHESIA:  General endotracheal.   DESCRIPTION OF PROCEDURE:  Shawnique Mariotti is a 77 year old lady.  The  patient was taken to room 11 on Friday, February 01, 2008, and given  general anesthesia.  The abdomen was prepped with Techni-Care and draped  sterilely.  I entered the abdomen through the left upper quadrant using  a zero degree OptiView technique.  Insufflation then ensued and we  placed standard trocar placements including two on the right, one  slightly to the left of the umbilicus for the camera, a lateral 5 on the  left and a 5 in the upper midline for the [facility] retractor.  The  operation began with a Nathanson retractor in place and taking the  Harmonic to incise the sac along the right side carrying this  anteriorly.  I also brought this across anteriorly across the anterior  surface of the distal esophagus.   We then worked on the left side and took down Data processing manager, worked way  up and freed the stomach from the left crus.  I went up in the chest and  took the sac down off with  dissection eventually getting around it with  the finger.  When a Penrose drain could be passed we used that for  traction and helped this mobilize the distal esophagus and free this up  completely.  I went up and passed the upper endoscope and visualized the  distal esophagus showing no evidence of any injury but more importantly  it validated esophagogastric junction and we were able to get this down  below the diaphragm.  After everything was mobilized nicely I then  repaired the diaphragm with three posterior sutures and then had a nice  closure of the hiatus using tie knots.  I then brought around a portion  of the cardia and then did invagination of the distal esophagus with the  stomach, suturing it in place with three sutures using again the Endo  stitch and two tie knots and then one in the middle was a tied suture.   Everything went well.  There was no bleeding noted.  No internal  injuries or wounds appreciated.  I surveyed and also put some Tisseel  behind the left stomach as a wrap to hopefully prevent herniation long-  term.  The patient was taken to the recovery room in satisfactory  condition.      Thornton Park Daphine Deutscher, MD  Electronically Signed     MBM/MEDQ  D:  02/01/2008  T:  02/01/2008  Job:  [id number]   cc:   Lelon Perla, DO  [address] [place], [place] [address]   Wilhemina Bonito. Marina Goodell, MD  [address]  [place]  [place] [address]

## 2010-08-24 NOTE — Assessment & Plan Note (Signed)
[facility]                         GASTROENTEROLOGY OFFICE NOTE   NAME:[name]                       MRN:          [id number]  DATE:06/13/2007                            DOB:          1930/03/16    REFERRING PHYSICIAN:  Lelon Perla, DO   REASON FOR CONSULTATION:  Abdominal pain.   HISTORY:  This is a 77 year old white female with the history of type 2  diabetes mellitus, hypertension, and gastroesophageal reflux disease.  She is referred through the courtesy of Dr. Laury Axon regarding abdominal  pain.  Patient was evaluated in this office in October of 2007 for  abdominal pain.  As well, she was recently seen in the emergency room  for the same type of discomfort.  Basically, she describes a many-year  history of problems with upper abdominal pain that occurs  intermittently.  It tends to occur in the right upper quadrant, as well  as epigastric region, though most prominent in the left upper quadrant.  Over the past year, she states that the intensity and frequency have  increased.  There has been some nausea and vomiting.  No fevers.  The  discomfort may be exacerbated by meals, though it may be exacerbated by  certain types of movement or activity.  It tends to last 30-60 minutes,  then resolves.  For reflux disease, she had been on Prilosec OTC 20 mg  daily.  Her prior GI evaluation in 2007 included colonoscopy and upper  endoscopy.   Colonoscopy revealed multiple colon polyps, including cecal polyp, which  was removed in piecemeal fashion.  Followup in one year recommended.  Patient did receive a recall letter.  Upper endoscopy revealed a large  hiatal hernia, measuring 7 cm.  There were associated erosions, as well  as mild esophagitis.  Her symptoms were felt to be secondary to reflux.  She was treated with 40 mg of omeprazole daily and asked to follow up in  the office in four weeks.  At the time of followup, she was doing well.  She was told to continue on proton pump inhibitor therapy and comply  with reflux precautions.   Her most recent evaluation in the emergency room revealed her vital  signs to be stable.  Her abdomen was benign on physical exam.  Laboratories revealed a white blood cell count of 12.9, glucose of 268.  Labs were otherwise unremarkable, including normal liver function tests  and lipase.  She was treated with Vicodin and asked to keep this  appointment which had been previously scheduled.   Patient denies weight-loss.  She has actually had some mild weight-gain.  There have been no fevers, change in urine color, or change in bowel  habits.  No evidence of GI bleeding.  Abdominal ultrasound in 2007  revealed fatty liver, as well as a small hepatic hemangioma and renal  angiomyolipoma.   PAST MEDICAL HISTORY:  As above.   PAST SURGICAL HISTORY:  Hysterectomy.   ALLERGIES:  CODEINE, PENICILLIN and IV CONTRAST.   CURRENT MEDICATIONS:  1. Prilosec OTC 20 mg b.i.d.  2. Nitrofurantoin 100 mg  daily for 7 days.  3. Glimepiride 4 mg b.i.d.  4. Pheno fibrate 160 mg daily.  5. Ramipril 2.5 mg daily.  6. Actos 15 mg daily.   FAMILY HISTORY:  No history of gastrointestinal malignancy.   SOCIAL HISTORY:  Patient is widowed with three children.  She is  accompanied by her daughter.  She does not smoke or use alcohol.   PHYSICAL EXAM:  Well-appearing female, in no acute distress.  Blood pressure is 112/64, heart rate 74 and regular, weight is 147.4  pounds (increased 5 pounds).  HEENT:  Sclerae are anicteric.  Conjunctivae are pink.  Oral mucosa is  intact, no adenopathy.  LUNGS:  Clear.  HEART:  Regular.  ABDOMEN:  Soft without tenderness, masses or hernias.  Good bowel sounds  heard.  No succussion splash.  No hernia.  EXTREMITIES:  Without edema.   IMPRESSION:  1. Chronic recurrent upper abdominal pain, now worsening with      occasional nausea and vomiting.  I suspect her symptoms are  due to      reflux disease and her large hiatal hernia.  Other causes, such as      occult biliary disease, should be excluded.  2. History of multiple adenomatous colon polyps, including piecemeal      resection of a large cecal adenoma.  Due for surveillance      colonoscopy.  3. General medical problems, including diabetes mellitus.   RECOMMENDATIONS:  1. Obtain abdominal ultrasound to rule out gallstones.  2. Schedule HIDA scan to rule out chronic cholecystitis if ultrasound      negative.  3. Schedule colonoscopy with polypectomy, if necessary.  The nature of      the procedure, as well as the risks, benefits, and alternatives,      have been reviewed.  She understood and agreed to proceed.  4. Hold oral diabetic agents the day of her exam.  5. Continue b.i.d. proton pump inhibitor.  6. If symptoms persist and gallbladder imaging studies are negative,      then consider upper GI      series to re-evaluate her hiatal hernia.  If it is enlarging or any      complicating features, then she may require surgical evaluation.     [name] [name] Goodell, MD  Electronically Signed    JNP/MedQ  DD: 06/15/2007  DT: 06/15/2007  Job #: [id number]

## 2010-08-24 NOTE — Discharge Summary (Signed)
[name], [name]                ACCOUNT NO.:  [id number]   MEDICAL RECORD NO.:  [id number]          PATIENT TYPE:  INP   LOCATION:  1527                         FACILITY:  [facility]   PHYSICIAN:  Thornton Park. Daphine Deutscher, MD  DATE OF BIRTH:  November 03, 1929   DATE OF ADMISSION:  02/01/2008  DATE OF DISCHARGE:  02/06/2008                               DISCHARGE SUMMARY   ADMITTING DIAGNOSES:  Longstanding heartburn, gastroesophageal reflux  and a large hiatal hernia.   DISCHARGE DIAGNOSIS:  Type 3 mixed hiatal hernia.   PROCEDURE:  Laparoscopic takedown of hiatus hernia, endoscopy, repair of  hiatus hernia with 3 pledgeted sutures, three-suture Nissen  fundoplication wrap over a #50-French bougie.   COURSE IN THE HOSPITAL:  [name] is a 77 year old lady who  underwent the above-mentioned operation.  Postoperatively she had some  pain in her shoulders pretty much from the closure of her hiatus.  A  water-soluble contrast study was done February 02, 2008, which showed no  leak.  She was started on liquids and maintained on liquids throughout,  advancing to full liquids prior to discharge.  I did have to manage her  diabetes postoperatively, looking at this daily as she would get up over  the 200 mark some mornings, having to bump up her sliding scale.  However, by the time of discharge, her CBGs were heading down in the 160-  170 range.  She is ready for discharge on February 06, 2008 and will  follow up in the office in 4 weeks.  She was discharged home on a pureed  diet.  Instructions were given to her.   CONDITION:  Good.   No prescriptions as she prefers to use over-the-counter medicines.   FINAL DIAGNOSIS:  Status post repair of type 3 mixed hiatal hernia.      Thornton Park Daphine Deutscher, MD  Electronically Signed     MBM/MEDQ  D:  02/06/2008  T:  02/06/2008  Job:  [id number]   cc:   Lelon Perla, DO  [address] [place], [place] [address]   Wilhemina Bonito. Marina Goodell, MD  [address]  [place]  [place] [address]

## 2010-08-24 NOTE — Assessment & Plan Note (Signed)
OFFICE VISIT   [name], [name]  DOB:  1930-03-19                                       11/28/2007  ZOXWR#:[id number]   The patient is a 77 year old female referred by Dr. Fredia Sorrow for  bilateral lower extremity claudication symptoms.  She has had pain in  her lower extremities for some time.  She also has a history of  neuropathy.  She states that the pain is worse with walking.  However,  she also has some pain when standing still and has a sponge type  sensation under both feet which is intermittent in nature.  She has had  no open wounds or ulcerations on her feet.  She states that her left leg  is worse than her right.  Her distance of walking is inconsistent as far  as how far she can walk before experiencing pain.  Some days it is worse  than others or pain in her calf.  Again, she denies any rest pain.  She  had bilateral ABIs performed at [facility] on 05/31/2007.  This  showed an ABI of greater than 1 bilaterally most likely secondary to  compressible vessels.  She subsequently had a CT angiogram performed on  07/11/2007.  This showed primarily superficial femoral and popliteal  artery occlusive disease bilaterally.  She had runoff via primarily  right peroneal artery with occlusion of the right posterior tibial  artery and a very small right anterior tibial artery.  She also had only  left peroneal runoff with a very small left anterior tibial artery and  segmental occlusions of the left posterior tibial artery.   Her primary atherosclerotic risk factors include diabetes which she has  had for at least 7-8 years.  She also has elevated cholesterol.  Smoked  briefly 30 years ago.   PAST SURGICAL HISTORY:  She had a thyroid operation and a hysterectomy.   MEDICATIONS:  1. Include aspirin 81 mg once a day.  2. Fenofibrate 160 mg once a day.  3. Ramipril 2.5 mg once a day.  4. Januvia 100 mg once a day.  5. Glimepiride 4 mg two times a  day.   ALLERGIES:  She has a side effect of codeine of a racing heart.   FAMILY HISTORY:  Unremarkable.   SOCIAL HISTORY:  She is widowed and has three children.  Smoking history  is as above.  She does not consume alcohol regularly.   REVIEW OF SYSTEMS:  CONSTITUTIONAL:  She is 5 feet 4 inches, 145 pounds.  CARDIAC:  She has dyspnea with exertion.  Pulmonary, renal, neurologic, psychiatric, ENT and hematologic review of  systems are all negative.  GI:  She has a history of reflux and hiatal hernia and is currently  being evaluated by Dr. Wenda Low for this.  She also has some  intermittent constipation.  VASCULAR:  Negative.  ORTHOPEDICS:  She has multiple joint arthritis.   PHYSICAL EXAM:  Vital signs:  Blood pressure is 143/84 in the left arm,  heart rate 78 and regular.  HEENT:  Unremarkable.  Neck:  She has 2+  carotid pulses without bruit.  Chest:  Clear to auscultation.  Cardiac:  Is regular rate and rhythm without murmur.  Abdomen:  Is soft,  nontender, nondistended with no masses.  Extremities:  She has 2+ radial  and femoral pulses  bilaterally.  She has absent popliteal and pedal  pulses bilaterally.  She has no edema in the lower extremities.  Feet  are pink, warm and adequately perfused.  There are no ulcerations on the  feet.   In summary, the patient has fairly severe superficial femoral artery and  popliteal artery occlusive disease.  Fortunately her symptoms currently  are only manifested as claudication symptoms.  If she develops more limb  threatening ischemia such as open wounds on the foot or rest pain I  would consider revascularization at that time.  However, any  revascularization would involve most likely a tibial artery bypass and I  would reserve this for limb salvage alone.  I do not currently think  that she is in this category.  I believe she warrants close followup.  I  counseled her today in checking her feet and if she develops any  ulcerations  or develops worsening pain in her feet she should return for  followup sooner.  Otherwise I will see her back in 6 months' time and we  will check toe pressures again at that time.   Janetta Hora. Fields, MD  Electronically Signed   CEF/MEDQ  D:  11/29/2007  T:  11/29/2007  Job:  [id number]   cc:   Lelon Perla, DO  Jodi Marble. Fredia Sorrow, M.D.

## 2010-08-24 NOTE — Assessment & Plan Note (Signed)
[name]                         GASTROENTEROLOGY OFFICE NOTE   [name], [name]                       MRN:          [id number]  DATE:08/06/2007                            DOB:          1929-04-28    REASON FOR EVALUATION:  Abdominal pain.   HISTORY:  [name] presents today for followup.  She is a 77 year old with  type 2 diabetes mellitus, hypertension, and reflux disease.  She was  evaluated in March for abdominal pain.  Problems with pain are generally  in the epigastric region and are recurrent.  She also has a history of  adenomatous colon polyps.  She is noted to have a large hiatal hernia.  She underwent surveillance colonoscopy on June 15, 2007.  Multiple  diminutive polyps noted.  Followup in three years recommended.   For her recurrent upper abdominal pain, an abdominal ultrasound was  performed and returned negative.  A HIDA scan was performed and returned  negative.  Upper GI series confirmed the presence of a large hiatal  hernia.  No other abnormalities.   For reflux, she has been on Prilosec 20 mg b.i.d.  She continues  intermittently with some upper abdominal discomfort.  It occurs a few  times per month.  Really not increasing in severity or frequency since  her last visit.  We discuss the prospects of seeing a surgeon regarding  hernia repair.   CURRENT MEDICATIONS:  1. Glimepiride 4 mg b.i.d.  2. Fenofibrate 160 mg daily.  3. Ramipril 2.5 mg daily.  4. Actos 15 mg daily.  5. Aspirin 81 mg daily.  6. Prilosec 20 mg b.i.d.  7. She also uses Aleve p.r.n. and Darvocet p.r.n.   PHYSICAL EXAMINATION:  A well-appearing female in no acute distress.  She is alert and oriented.  Blood pressure is 112/60.  Heart rate 64.  Weight is 148.4 pounds.  Respirations are 18 and unlabored.  HEENT:  Sclerae are anicteric.  Oral mucosa intact.  LUNGS:  Clear.  HEART:  Regular.  ABDOMEN:  Soft without tenderness, mass, or hernia.  Good  bowel sounds  heard.   IMPRESSION:  1. Recurrent problems with abdominal pain, likely due to large hiatal      hernia.  2. Reflux disease, being treated with b.i.d. Prilosec.  3. Multiple adenomatous colon polyps.  Recent colonoscopy, as      described.  4. Pan diverticulosis.  5. General medical problems, under the care of Dr. Laury Axon.   RECOMMENDATIONS:  1. Continue Prilosec.  2. I discussed with the patient the prospects of seeing a general      surgeon regarding repair or reduction of her large hiatal hernia.      At this time, she is not interested.  I told her if her symptoms      worsen in terms of severity or frequency, then I would strongly      encourage surgical evaluation.  She understands and will resume her      general medical care with Dr. Laury Axon.  3. In terms of colonoscopy, she will be due again in about three  years.     Wilhemina Bonito. Marina Goodell, MD  Electronically Signed    JNP/MedQ  DD: 08/06/2007  DT: 08/06/2007  Job #: [id number]   cc:   Lelon Perla, DO

## 2010-08-27 ENCOUNTER — Other Ambulatory Visit: Payer: Self-pay | Admitting: Family Medicine

## 2010-08-27 NOTE — Assessment & Plan Note (Signed)
[facility]                           GASTROENTEROLOGY OFFICE NOTE   NAME:[name], [name]                       MRN:          [id number]  DATE:01/12/2006                            DOB:          Aug 01, 1929    REASON FOR CONSULTATION:  Abdominal pain.   HISTORY:  This is a 77 year old white female with a history of type 2  diabetes mellitus and gastroesophageal reflux disease who is referred  through the courtesy of Dr. Laury Axon regarding abdominal pain.  The patient  recently established with primary care January 06, 2006, as a new patient.  She had been seeing Dr. Jeanmarie Hubert previously but the patient states that  he appeared frustrated with her multiple complaints; thus, she changed.  She  presents today for evaluation of problems with upper abdominal pain that  have occurred intermittently for at least 5 years.  She describes the pain  as occurring in the right upper quadrant epigastric region and most  prominent in the left upper quadrant.  There is no associated nausea or  vomiting, no radiation into the back, no fevers.  The discomfort seems to  come on after meals, though it is not always exacerbated by meals.  Discomfort occurs for 30-45 minutes, then resolves.  Generally this occurs  once every couple of months.  It has been a bit more frequent recently.  It  has not awoken her at night.  She has had two such episodes over the past  week.  No change in urine color.  She did undergo an abdominal ultrasound in  March 2007.  There was no evidence of gallstones.  Fatty infiltration of the  liver was present.  Small hepatic hemangioma and a small renal  angiomyolipoma were noted.  The patient also has long history of reflux  disease.  She has been on Prilosec for indigestion and heartburn.  No  dysphagia.  Recently changed from Prilosec to Zegerid.  She was having some  break-through heartburn on Prilosec but no problems with Zegerid.  She  occasionally uses Aleve for pain, though not daily.   She reports chronic problems with constipation alternating with diarrhea.  This has gone on most of her life.  Some intermittent bloating.  No melena  or hematochezia.  No change in appetite or weight.  She thinks she underwent  a sigmoidoscopy 20 years ago.  She also thinks she underwent a CAT scan  about 25 years ago.  She reports an intolerance to the IV dye, stating  diarrhea and vomiting, though nothing that sounds like anaphylaxis or rash.   PAST MEDICAL HISTORY:  1. Type 2 diabetes.  2. Gastroesophageal reflux disease.   PAST SURGICAL HISTORY:  Hysterectomy.   ALLERGIES:  CODEINE, PENICILLIN, and IV CONTRAST DYE as discussed above.   CURRENT MEDICATIONS:  1. Amaryl 4 mg daily.  2. Zegerid 40 mg daily.  3. Os-Cal.  4. Aleve p.r.n.   FAMILY HISTORY:  Two sons with colon polyps.  No forms of gastrointestinal  malignancy.   SOCIAL HISTORY:  The patient is widowed with three children.  She  lives with  her son.  She attended high school and [employer].  She is retired.  She  does not smoke or use alcohol.   REVIEW OF SYSTEMS:  Per diagnostic evaluation form.   PHYSICAL EXAMINATION:  GENERAL:  Well-appearing female in no acute distress.  VITAL SIGNS:  Blood pressure 130/90, heart rate 72, weight is 142.2 pounds.  She is 5 feet 3 inches in height.  HEENT:  Sclerae are anicteric, conjunctivae are pink.  Oral mucosa is  intact.  No aphthous ulcerations, no thrush.  LUNGS:  Clear to auscultation and percussion.  HEART:  Regular without murmur.  ABDOMEN:  Soft and nontender with good bowel sounds.  No organomegaly, mass  or hernia.  RECTAL:  Deferred.  EXTREMITIES:  Without edema.   IMPRESSION:  1. Chronic intermittent upper abdominal pain of uncertain cause.  Possibly      acid peptic in nature.  Possibly pancreatic or biliary, though negative      ultrasound as discussed.  Possibly functional.  2. Chronic  gastroesophageal reflux disease without alarm symptoms.  3. Chronic alternating bowel habits and bloating consistent with irritable      bowel syndrome.  4. Family history of colon polyps.  5. Diabetes.   RECOMMENDATIONS:  1. Continue recently-started Zegerid.  2. Schedule upper endoscopy to evaluate recurrent upper abdominal pain.  3. Schedule colonoscopy to evaluate chronic lower GI complaints and      provide colorectal neoplasia screening.  The nature of both procedures      as well as the risks, benefits and alternatives were carefully reviewed      with the patient.  She understood and agreed to proceed.  4. If the above unrevealing in terms of the patient's pain, then consider      CT scan of the abdomen.            ______________________________  Wilhemina Bonito. Eda Keys., MD    JNP/MedQ  DD:  01/12/2006 DT:  01/13/2006 Job #:  [id number]   cc:   Lelon Perla, DO

## 2010-08-27 NOTE — Assessment & Plan Note (Signed)
[facility]                          [place] OFFICE NOTE   NAME:[name], [name]                       MRN:          [id number]  DATE:01/06/2006                            DOB:          24-Dec-1929    NEW PATIENT EVALUATION   The patient is a 77 year old, white female who presents today to the office  to establish.  She complains of mid abdominal pain, an area that comes and  goes for several years.  She states her previous physician had done an  ultrasound and was told everything was okay.  She also complains of a rash  on her back which she is seeing Dr. __________ a dermatologist for.  The  patient also has several other complaints; one of which is her pain in the  right groin area and her feet burning and her legs giving out on her, which  she says no one has ever been able to help her with that.   PAST MEDICAL HISTORY:  1. Diabetes type 2.  2. Hiatal hernia.   PAST FAMILY HISTORY:  She has 1 brother who is still alive.  Her mother died  of cancer and she has a paternal aunt who died of cancer.  She has a son, an  aunt, and a maternal grandfather with diabetes.  No other heart problems in  the family.  The patient had part of her thyroid removed and a total  abdominal hysterectomy.   SOCIAL HISTORY:  She denies any alcohol or smoking.  She is widowed and has  3 children.   ALLERGIES:  She is allergic to PENICILLIN, CODEINE, AND GLUCOPHAGE.  The  Glucophage gave her bad diarrhea.   CURRENT MEDICATIONS:  1. Amaryl 2 mg once a day.  2. Centrum multivitamin.  3. Prilosec OTC.  4. Aleve on occasion.   REVIEW OF SYSTEMS:  As above.   VITAL SIGNS:  Height 5 feet 3-1/2.  Weight 142.4.  Temperature 98.3.  Pulse  64.  Respirations 20.  Blood pressure is 130/86.  The patient is awake, alert, and oriented in no acute distress.  HEENT:  Head is normocephalic, atraumatic.  Eyes equal and reactive to  light.  Tympanic membranes are  intact.  Oropharynx is clear.  NECK:  Supple.  No JVD.  No bruits.  HEART:  Positive S1, S2.  No murmur was appreciated.  LUNGS:  Clear bilaterally.  No rales, rhonchi, or wheezing.  ABDOMEN:  Soft.  She did have some midepigastric tenderness but no rebound  or guarding.  The patient refused GYN or rectal exam today.  MUSCULOSKELETAL:  She had good strength in all 4 extremities.  Monofilament  test was negative bilaterally.  SKIN:  She did have an erythematous, papular rash on her back that she  states was itchy and sometimes painful.  NEUROLOGIC:  Cranial nerves II through XII are intact.  She had no sensory  motor deficit.  Deep tendon reflexes were equal and bilateral.  Negative  straight-leg raise.   EKG was normal sinus rhythm.   ASSESSMENT AND PLAN:  1. A history of  gastroesophageal reflux disease and a hiatal hernia with      increasing symptoms.  I will refer to gastrointestinal for further      evaluation.  I changed her Prilosec to Zegerid, 1 at night.  Samples      were given.  The patient was instructed on how to use and the side      effect and she states she understood.  2. Type 2 diabetes.  We will increase the Amaryl to 4 mg a day after      reviewing her blood sugars from home.  She will continue her b.i.d.      Accu-Chek's and we will check labs today.  3. General health maintenance.  The patient refused a Pneumovax and      tetanus.  She will schedule her own mammogram and we will check old      records as far as when her last bone density was done.  The patient      feels that was done recently.  4. Back pain with bilateral leg numbness.  The patient had an x-ray with      previous physician.  We will get the records from previous physician      before we do anything else.  The patient may need a MRI referral to      orthopedics or neurosurgery.       Lelon Perla, DO      YRL/MedQ  DD:  01/06/2006  DT:  01/09/2006  Job #:  [id number]

## 2010-08-27 NOTE — Assessment & Plan Note (Signed)
[facility]                           GASTROENTEROLOGY OFFICE NOTE   NAME:[name], [name] CHARETTE                       MRN:          [id number]  DATE:02/27/2006                            DOB:          03/12/1930    HISTORY:  [name] presents today for followup.  She was initially  evaluated January 12, 2006 for abdominal pain.  See that dictation for  details.  She subsequently underwent colonoscopy and upper endoscopy on  January 25, 2006.  Colonoscopy revealed multiple colon polyps as well as a  larger sessile lesion in the cecum which was removed in a piecemeal fashion,  in addition, diverticulosis.  The polyps were adenomatous. Followup in one  year given the number of polyps as well as piecemeal resection of the cecal  lesion.  Upper endoscopy revealed mild esophagitis and a large sliding  hiatal hernia with Cameron erosions.  She had been on Zegrid but has resumed  use of Prilosec, 40 mg daily recommended; I believe she is taking 20 mg  daily.  Since her exam she states she has been well.  She has had no  abdominal pain whatsoever.  She is quite pleased.   CURRENT MEDICATIONS:  Include Amaryl, Os-Cal, Prilosec.   PHYSICAL EXAMINATION:  GENERAL APPEARANCE:  Physical examination finds a  well-appearing female in no acute distress.  VITAL SIGNS:  Her blood pressure is 120/76, heart rate 72, weight 142  pounds.  ABDOMEN:  Soft, without tenderness, masses or hernia.  Good bowel sounds are  heard.   IMPRESSION:  1. Gastroesophageal reflux disease.  Symptoms controlled with daily proton      pump inhibitor therapy.  2. Large hiatal hernia with associated erosions.  3. Multiple colon polyps, status post polypectomy.  4. Incidental diverticulosis.  5. Recurrent problems with abdominal pain, possibly related to her hiatal      hernia and reflux.   RECOMMENDATIONS:  1. Continue proton pump inhibitor therapy.  2. Continue strict adherence to reflux  precautions.  3. Surveillance colonoscopy in one year as recommended.  4. Resume ongoing general medical care with Dr. Laury Axon.     Wilhemina Bonito. Eda Keys., MD  Electronically Signed    JNP/MedQ  DD: 02/27/2006  DT: 02/27/2006  Job #: [id number]   cc:   Lelon Perla, DO

## 2010-09-10 ENCOUNTER — Encounter (INDEPENDENT_AMBULATORY_CARE_PROVIDER_SITE_OTHER): Payer: Self-pay

## 2010-09-10 ENCOUNTER — Ambulatory Visit (INDEPENDENT_AMBULATORY_CARE_PROVIDER_SITE_OTHER): Payer: Medicare Other

## 2010-09-10 DIAGNOSIS — I739 Peripheral vascular disease, unspecified: Secondary | ICD-10-CM

## 2010-09-10 NOTE — Assessment & Plan Note (Signed)
OFFICE VISIT  [name], [name] DOB:  11/20/1929                                       09/10/2010 IHKVQ#:[id number]  ATTENDING PHYSICIAN:  Fransisco Hertz, MD.  CHIEF COMPLAINT:  Stable bilateral peripheral vascular disease and claudication.  The patient is a 77 year old woman who has known peripheral vascular disease with small tibial vessels which were also diseased.  She states she has claudication at less than 2 blocks.  She uses a cart at the grocery store and uses a motored cart in [place] to get around.  She otherwise can do her normal daily activities without difficulty.  She has no night or rest pain.  She denies any ulcers on her feet or any nonhealing wounds.  She does have some neuropathy and borderline diabetes but is on no medication and is diet controlled.  She states her symptoms are essentially unchanged from her previous visits.  VASCULAR LAB STUDIES:  Today her ABIs were 0.53 on the right and 0.58 on the left which is essentially unchanged.  PHYSICAL EXAM:  This is a well-developed, well-nourished woman in no acute distress.  She has normal gait.  Her heart rate is 68.  Her sats are 100%, respiratory rate is 12.  Both lower extremities are warm.  Her toes are cool and pink with good capillary refill.  She had Doppler signal in DP on the left.  I could not get a Doppler signal on the right although she states her symptomatology is worse on the left.  She had good sensation across the top of feet and good motion.  ASSESSMENT AND PLAN:  Chronic peripheral vascular disease with stable symptoms of claudication without night or rest pain.  This patient's multilevel disease it was stated in her last note that she would be only a candidate for a femoral to tibial bypass if her symptoms worsened. She will follow up in 6 months for new studies or sooner if her symptoms worsen or become life limiting.  She was cautioned to note any increased pain  in her legs or if she develops night or rest pain to follow up sooner.  Della Goo, PA-C  Fransisco Hertz, MD Electronically Signed  RR/MEDQ  D:  09/10/2010  T:  09/10/2010  Job:  [id number]

## 2010-09-28 ENCOUNTER — Other Ambulatory Visit: Payer: Self-pay | Admitting: Family Medicine

## 2010-10-29 ENCOUNTER — Other Ambulatory Visit: Payer: Self-pay | Admitting: Family Medicine

## 2010-10-29 NOTE — Telephone Encounter (Signed)
Letter mailed for CPX and Fasting Labs     KP

## 2010-11-09 ENCOUNTER — Other Ambulatory Visit: Payer: Self-pay | Admitting: Family Medicine

## 2010-11-22 ENCOUNTER — Ambulatory Visit (INDEPENDENT_AMBULATORY_CARE_PROVIDER_SITE_OTHER): Payer: Medicare Other | Admitting: Family Medicine

## 2010-11-22 ENCOUNTER — Encounter: Payer: Self-pay | Admitting: Family Medicine

## 2010-11-22 VITALS — BP 120/80 | HR 60 | Wt 130.4 lb

## 2010-11-22 DIAGNOSIS — K219 Gastro-esophageal reflux disease without esophagitis: Secondary | ICD-10-CM

## 2010-11-22 DIAGNOSIS — R21 Rash and other nonspecific skin eruption: Secondary | ICD-10-CM

## 2010-11-22 DIAGNOSIS — N6489 Other specified disorders of breast: Secondary | ICD-10-CM

## 2010-11-22 DIAGNOSIS — R1013 Epigastric pain: Secondary | ICD-10-CM

## 2010-11-22 MED ORDER — ERYTHROMYCIN BASE 500 MG PO TABS: 500.0000 mg | ORAL_TABLET | Freq: Two times a day (BID) | ORAL | Status: AC

## 2010-11-22 MED ORDER — ESOMEPRAZOLE MAGNESIUM 40 MG PO CPDR: 40.0000 mg | DELAYED_RELEASE_CAPSULE | Freq: Every day | ORAL | Status: DC

## 2010-11-22 NOTE — Patient Instructions (Signed)
Esophagitis (Heartburn) Esophagitis (heartburn) is a painful, burning sensation in the chest. It may feel worse in certain positions, such as lying down or bending over. It is caused by stomach acid backing up into the tube that carries food from the mouth down to the stomach (lower esophagus). TREATMENT There are a number of non-prescription medicines used to treat heartburn, including:  Antacids.   Acid reducers (also called H-2 blockers).   Proton-pump inhibitors.  HOME CARE INSTRUCTIONS  Raise the head of your bead by putting blocks under the legs.   Eat 2-3 hours before going to bed.   Stop smoking.   Try to reach and maintain a healthy weight.   Do not eat just a few very large meals. Instead, eat many smaller meals throughout the day.   Try to identify foods and beverages that make your symptoms worse, and avoid these.   Avoid tight clothing.   Do not exercise right after eating.  SEEK IMMEDIATE MEDICAL CARE IF YOU:  Have severe chest pain that goes down your arm, or into your jaw or neck.   Feel sweaty, dizzy, or lightheaded.   Are short of breath.   Throw up (vomit) blood.   Have difficulty or pain with swallowing.   Have bloody or black, tarry stools.   Have bouts of heartburn more than three times a week for more than two weeks.  Document Released: 05/05/2004 Document Re-Released: 06/22/2009 [employer] Patient Information 2011 [employer]. 

## 2010-11-23 ENCOUNTER — Other Ambulatory Visit: Payer: Self-pay | Admitting: Family Medicine

## 2010-11-23 ENCOUNTER — Telehealth: Payer: Self-pay

## 2010-11-23 DIAGNOSIS — R21 Rash and other nonspecific skin eruption: Secondary | ICD-10-CM | POA: Insufficient documentation

## 2010-11-23 MED ORDER — MUPIROCIN CALCIUM 2 % EX CREA: TOPICAL_CREAM | CUTANEOUS | Status: DC

## 2010-11-23 NOTE — Telephone Encounter (Signed)
bactroban  Cream  Apply tid for 7 days

## 2010-11-23 NOTE — Assessment & Plan Note (Signed)
nexium 40 mg 1 po qd  Refer GI

## 2010-11-23 NOTE — Assessment & Plan Note (Signed)
L breast/ nipple-----  abx tabs and bactroban

## 2010-11-23 NOTE — Progress Notes (Signed)
  Subjective:    Patient ID: [name], female    DOB: Sep 28, 1929, 77 y.o.   MRN: [id number]  HPI Pt here c/o crusting, itchy rash on L nipple.  Symptoms for a few months.  Pt also c/o gerd symptoms and choking with food.  No other complaints.  No cp,  Or sob.  Pt is taking frequent TUMS.    Review of Systems As above    Objective:   Physical Exam  Constitutional: She appears well-developed and well-nourished.  Cardiovascular: Normal rate, regular rhythm and normal heart sounds.   No murmur heard. Pulmonary/Chest: Effort normal and breath sounds normal.  Abdominal: There is tenderness. There is no rebound and no guarding.       + midepigastric tenderness  Musculoskeletal: She exhibits no tenderness.  Skin:       + yellow crusting L nipple and papules + pruritic  Psychiatric: She has a normal mood and affect. Her behavior is normal. Judgment and thought content normal.          Assessment & Plan:

## 2010-11-23 NOTE — Telephone Encounter (Signed)
Message left on triage voicemail: patient would like a cream for bumps. Patient was seen yesterday and told cream would be sent in . [facility])   I called patient and informed her we got her message and will forward to [facility] and call her back the earlier part of tomorrow, patient ok'd  Dr.Lowne please advise and forward to triage Sunny Schlein)

## 2010-11-24 NOTE — Telephone Encounter (Signed)
Pt aware Rx sent into pharmacy 

## 2010-11-29 ENCOUNTER — Telehealth: Payer: Self-pay | Admitting: *Deleted

## 2010-11-29 ENCOUNTER — Ambulatory Visit
Admission: RE | Admit: 2010-11-29 | Discharge: 2010-11-29 | Disposition: A | Discharge status: Home / Self Care | Payer: Medicare Other | Source: Ambulatory Visit | Attending: Family Medicine | Admitting: Family Medicine

## 2010-11-29 DIAGNOSIS — N6489 Other specified disorders of breast: Secondary | ICD-10-CM

## 2010-11-29 NOTE — Telephone Encounter (Signed)
Pt states that bactroban Cream is too expensive and would like to know if a cheaper med can be Rx.Please advise

## 2010-11-29 NOTE — Telephone Encounter (Signed)
Discussed with patient and she voiced understanding.      KP 

## 2010-11-29 NOTE — Telephone Encounter (Signed)
There really isn't another one for that problem---she can see if abx alone helps it.

## 2010-12-28 ENCOUNTER — Ambulatory Visit (INDEPENDENT_AMBULATORY_CARE_PROVIDER_SITE_OTHER): Payer: Medicare Other | Admitting: Internal Medicine

## 2010-12-28 ENCOUNTER — Encounter: Payer: Self-pay | Admitting: Internal Medicine

## 2010-12-28 DIAGNOSIS — R1310 Dysphagia, unspecified: Secondary | ICD-10-CM

## 2010-12-28 DIAGNOSIS — K219 Gastro-esophageal reflux disease without esophagitis: Secondary | ICD-10-CM

## 2010-12-28 DIAGNOSIS — E119 Type 2 diabetes mellitus without complications: Secondary | ICD-10-CM

## 2010-12-28 DIAGNOSIS — Z86010 Personal history of colonic polyps: Secondary | ICD-10-CM

## 2010-12-28 NOTE — Patient Instructions (Signed)
EGD with Dilt. :[facility] 12/29/10 3:30 pm arrive at 2:30 pm on 4th floor. Hold Diabetic meds morning of procedure.

## 2010-12-29 ENCOUNTER — Ambulatory Visit (AMBULATORY_SURGERY_CENTER): Payer: Medicare Other | Admitting: Internal Medicine

## 2010-12-29 ENCOUNTER — Encounter: Payer: Self-pay | Admitting: Internal Medicine

## 2010-12-29 DIAGNOSIS — K219 Gastro-esophageal reflux disease without esophagitis: Secondary | ICD-10-CM

## 2010-12-29 DIAGNOSIS — R1310 Dysphagia, unspecified: Secondary | ICD-10-CM

## 2010-12-29 DIAGNOSIS — K222 Esophageal obstruction: Secondary | ICD-10-CM

## 2010-12-29 MED ORDER — SODIUM CHLORIDE 0.9 % IV SOLN: 500.0000 mL | INTRAVENOUS | Status: DC

## 2010-12-29 NOTE — Patient Instructions (Signed)
Discharge instructions given with verbal understanding.  Repeat egd and dilatation in 4 weeks. Procedure on Friday 01-28-2011 at 3;30pm. Arrive at 2;30pm. previsit on Friday 01-14-2011 at 2;30pm.  Resume previous medications.

## 2010-12-30 ENCOUNTER — Telehealth: Payer: Self-pay | Admitting: *Deleted

## 2010-12-30 ENCOUNTER — Encounter: Payer: Self-pay | Admitting: Internal Medicine

## 2010-12-30 NOTE — Telephone Encounter (Signed)

## 2010-12-30 NOTE — Progress Notes (Signed)
HISTORY OF PRESENT ILLNESS:  [name] is a 77 y.o. female with multiple medical problems as listed below. She has been followed in this office for a history of adenomatous colon polyps (multiple and large), GERD, and a history of a large hiatal hernia for which she underwent repair about 3 years ago. Her last colonoscopy was in March of 2009. Multiple polyps removed. Severe diverticulosis throughout. Followup in 3 years recommended. She received a recall letter, a knowledge as the importance, but has elected not to schedule. Her chief complaint today is that of progressive solid food dysphagia and heartburn of several months duration. She has great difficulty swallowing any solid foods or pills and sometimes needs to regurgitate food for relief. No weight loss. She was placed on Nexium, which she has run out of. This did not help her dysphagia, but did help heartburn. She wants to have this evaluated ASAP as she is going to the beach this weekend. She still is not want to schedule colonoscopy yet.  REVIEW OF SYSTEMS:  All non-GI ROS negative except for headaches, itching due to dry skin, feet and leg cramps, shortness of breath, intermittent sleeping problems, sore throat, intermittent ankle edema, visual change, cough, fatigue  Past Medical History  Diagnosis Date  . GERD (gastroesophageal reflux disease)   . Hyperlipidemia   . Type II or unspecified type diabetes mellitus with neurological manifestations, not stated as uncontrolled   . Corns and callosities   . Abdominal pain, epigastric   . Rash and other nonspecific skin eruption   . Diaphragmatic hernia without mention of obstruction or gangrene   . Hiatal hernia   . Internal hemorrhoids without mention of complication   . History of colon polyps 2009    Hyperplastic  . Hx of adenomatous colonic polyps 2009  . Diverticulosis of colon (without mention of hemorrhage) 2009  . IBS (irritable bowel syndrome)   . Anxiety   . Status post  dilation of esophageal narrowing   . Thyroid disease     Past Surgical History  Procedure Date  . Thyroidectomy, partial   . Abdominal hysterectomy   . Bmd 2007  . Esophagogastroduodenoscopy 2007  . Laparoscopic nissen fundoplication 2009  . Cataract extraction     Social History [name]  reports that she has quit smoking. She has never used smokeless tobacco. She reports that she does not drink alcohol or use illicit drugs.  family history includes Breast cancer in her paternal aunt; Diabetes in her maternal grandfather and son; Heart disease in her paternal grandmother; and Uterine cancer in her mother.  Allergies  Allergen Reactions  . Codeine   . Metformin   . Penicillins   . Pioglitazone     REACTION: rash       PHYSICAL EXAMINATION: Vital signs: BP 118/80  Pulse 77  Ht 5\' 4"  (1.626 m)  Wt 132 lb (59.875 kg)  BMI 22.66 kg/m2  Constitutional: generally well-appearing, no acute distress Psychiatric: alert and oriented x3, cooperative Eyes: extraocular movements intact, anicteric, conjunctiva pink Mouth: oral pharynx moist, no lesions Neck: supple no lymphadenopathy Cardiovascular: heart regular rate and rhythm, no murmur Lungs: clear to auscultation bilaterally Abdomen: soft, nontender, nondistended, no obvious ascites, no peritoneal signs, normal bowel sounds, no organomegaly Rectal: Deferred Extremities: no lower extremity edema bilaterally Skin: no lesions on visible extremities Neuro: No focal deficits.   ASSESSMENT:  #1. Progressive intermittent solid food dysphagia. Rule out peptic stricture. Rule out neoplasia #2. GERD with a  large hiatal hernia status post fundoplication with hernia repair 2009 #3. History of multiple adenomatous colon polyps. Overdue for followup #4. History of severe diverticulosis #5. Multiple medical problems including diabetes mellitus for which she is on medication   PLAN:  #1. Daily PPI #2. Reflux precautions #3.  Upper endoscopy with esophageal dilation.The nature of the procedure, as well as the risks, benefits, and alternatives were carefully and thoroughly reviewed with the patient. Ample time for discussion and questions allowed. The patient understood, was satisfied, and agreed to proceed. We will set this up for tomorrow afternoon #4. Patient knows to contact the office at her very near his convenience to set up for surveillance colonoscopy. #5. Hold diabetic medications preprocedure

## 2011-01-03 LAB — DIFFERENTIAL
Basophils Absolute: 0.1
Basophils Relative: 1
Eosinophils Absolute: 0.1
Neutro Abs: 9.8 — ABNORMAL HIGH
Neutrophils Relative %: 77

## 2011-01-03 LAB — CBC
HCT: 38.7
Hemoglobin: 13
MCHC: 33.6
MCV: 82
RBC: 4.72

## 2011-01-03 LAB — URINALYSIS, ROUTINE W REFLEX MICROSCOPIC
Bilirubin Urine: NEGATIVE
Glucose, UA: NEGATIVE
Hgb urine dipstick: NEGATIVE
Ketones, ur: NEGATIVE
pH: 6.5

## 2011-01-03 LAB — URINE MICROSCOPIC-ADD ON

## 2011-01-03 LAB — COMPREHENSIVE METABOLIC PANEL
ALT: 17
Alkaline Phosphatase: 66
BUN: 18
CO2: 24
GFR calc non Af Amer: 58 — ABNORMAL LOW
Glucose, Bld: 268 — ABNORMAL HIGH
Potassium: 4.4
Sodium: 136

## 2011-01-03 LAB — LIPASE, BLOOD: Lipase: 41

## 2011-01-03 LAB — POCT CARDIAC MARKERS: Operator id: 270651

## 2011-01-11 LAB — BASIC METABOLIC PANEL
BUN: 3 — ABNORMAL LOW
CO2: 25
Calcium: 8.2 — ABNORMAL LOW
Chloride: 101
Chloride: 106
Chloride: 107
Creatinine, Ser: 0.55
Creatinine, Ser: 0.7
GFR calc Af Amer: >60
GFR calc Af Amer: >60
GFR calc Af Amer: >60
GFR calc non Af Amer: >60
Glucose, Bld: 221 — ABNORMAL HIGH
Potassium: 4.1
Sodium: 136
Sodium: 138

## 2011-01-11 LAB — GLUCOSE, CAPILLARY
Glucose-Capillary: 172 — ABNORMAL HIGH
Glucose-Capillary: 175 — ABNORMAL HIGH
Glucose-Capillary: 199 — ABNORMAL HIGH
Glucose-Capillary: 202 — ABNORMAL HIGH
Glucose-Capillary: 212 — ABNORMAL HIGH
Glucose-Capillary: 212 — ABNORMAL HIGH
Glucose-Capillary: 224 — ABNORMAL HIGH
Glucose-Capillary: 245 — ABNORMAL HIGH

## 2011-01-11 LAB — CBC
HCT: 32.7 — ABNORMAL LOW
Hemoglobin: 12.2
MCHC: 32.9
MCV: 81.6
RBC: 4.01
RBC: 4.48
WBC: 7

## 2011-01-11 LAB — DIFFERENTIAL
Lymphocytes Relative: 11 — ABNORMAL LOW
Monocytes Absolute: 1
Monocytes Relative: 9
Neutro Abs: 9 — ABNORMAL HIGH

## 2011-01-11 LAB — HEMOGLOBIN AND HEMATOCRIT, BLOOD
HCT: 41.7
Hemoglobin: 14.1

## 2011-01-14 ENCOUNTER — Ambulatory Visit (AMBULATORY_SURGERY_CENTER): Payer: Medicare Other | Admitting: *Deleted

## 2011-01-14 VITALS — Ht 64.0 in | Wt 135.1 lb

## 2011-01-14 DIAGNOSIS — K219 Gastro-esophageal reflux disease without esophagitis: Secondary | ICD-10-CM

## 2011-01-14 DIAGNOSIS — R1310 Dysphagia, unspecified: Secondary | ICD-10-CM

## 2011-01-27 ENCOUNTER — Other Ambulatory Visit: Payer: Self-pay | Admitting: Family Medicine

## 2011-01-28 ENCOUNTER — Encounter: Payer: Self-pay | Admitting: Internal Medicine

## 2011-01-28 ENCOUNTER — Ambulatory Visit (AMBULATORY_SURGERY_CENTER): Payer: Medicare Other | Admitting: Internal Medicine

## 2011-01-28 DIAGNOSIS — R1310 Dysphagia, unspecified: Secondary | ICD-10-CM

## 2011-01-28 DIAGNOSIS — K219 Gastro-esophageal reflux disease without esophagitis: Secondary | ICD-10-CM

## 2011-01-28 DIAGNOSIS — K222 Esophageal obstruction: Secondary | ICD-10-CM

## 2011-01-28 MED ORDER — SODIUM CHLORIDE 0.9 % IV SOLN: 500.0000 mL | INTRAVENOUS | Status: DC

## 2011-01-28 NOTE — Patient Instructions (Signed)
Discharge instructions given with verbal understanding.  Returned EGD with Balloon dilatation on 02-11-2011 at 2;00pm. previsit on 02-07-2011 at 9;00am.  Handout on a dilatation diet given.  Resume previous medications.

## 2011-01-31 ENCOUNTER — Telehealth: Payer: Self-pay | Admitting: *Deleted

## 2011-01-31 NOTE — Telephone Encounter (Signed)
No answer at home #.  No ID on voicemail.  No message left.

## 2011-02-07 ENCOUNTER — Ambulatory Visit (AMBULATORY_SURGERY_CENTER): Payer: Medicare Other

## 2011-02-07 ENCOUNTER — Encounter: Payer: Self-pay | Admitting: Internal Medicine

## 2011-02-07 DIAGNOSIS — R1310 Dysphagia, unspecified: Secondary | ICD-10-CM

## 2011-02-10 ENCOUNTER — Other Ambulatory Visit: Payer: Self-pay | Admitting: Family Medicine

## 2011-02-11 ENCOUNTER — Ambulatory Visit (AMBULATORY_SURGERY_CENTER): Payer: Medicare Other | Admitting: Internal Medicine

## 2011-02-11 ENCOUNTER — Encounter: Payer: Self-pay | Admitting: Internal Medicine

## 2011-02-11 VITALS — BP 165/73 | HR 66 | Temp 96.9°F | Resp 20 | Ht 64.0 in | Wt 135.0 lb

## 2011-02-11 DIAGNOSIS — K222 Esophageal obstruction: Secondary | ICD-10-CM

## 2011-02-11 DIAGNOSIS — R1310 Dysphagia, unspecified: Secondary | ICD-10-CM

## 2011-02-11 LAB — GLUCOSE, CAPILLARY: Glucose-Capillary: 155 mg/dL — ABNORMAL HIGH (ref 70–99)

## 2011-02-11 MED ORDER — DEXTROSE 5 % IV SOLN: INTRAVENOUS | Status: DC

## 2011-02-11 NOTE — Patient Instructions (Signed)
Handouts on stricture and dilation diet  Continue dexilant samples and then start omeprazole  Repeat endoscopy in 6 weeks in the [facility]  Discharge instructions in blue and green sheets

## 2011-02-14 ENCOUNTER — Telehealth: Payer: Self-pay | Admitting: *Deleted

## 2011-02-14 LAB — GLUCOSE, CAPILLARY: Glucose-Capillary: 72 mg/dL (ref 70–99)

## 2011-02-14 NOTE — Telephone Encounter (Signed)

## 2011-02-22 ENCOUNTER — Encounter: Payer: Self-pay | Admitting: Vascular Surgery

## 2011-03-11 ENCOUNTER — Encounter: Payer: Self-pay | Admitting: Internal Medicine

## 2011-03-11 ENCOUNTER — Ambulatory Visit (AMBULATORY_SURGERY_CENTER): Payer: Medicare Other | Admitting: *Deleted

## 2011-03-11 VITALS — Ht 64.0 in | Wt 138.8 lb

## 2011-03-11 DIAGNOSIS — K222 Esophageal obstruction: Secondary | ICD-10-CM

## 2011-03-11 DIAGNOSIS — R1310 Dysphagia, unspecified: Secondary | ICD-10-CM

## 2011-03-16 ENCOUNTER — Encounter: Payer: Self-pay | Admitting: Vascular Surgery

## 2011-03-17 ENCOUNTER — Ambulatory Visit (INDEPENDENT_AMBULATORY_CARE_PROVIDER_SITE_OTHER): Payer: Medicare Other | Admitting: Vascular Surgery

## 2011-03-17 ENCOUNTER — Encounter: Payer: Self-pay | Admitting: Vascular Surgery

## 2011-03-17 ENCOUNTER — Encounter (INDEPENDENT_AMBULATORY_CARE_PROVIDER_SITE_OTHER): Payer: Medicare Other | Admitting: *Deleted

## 2011-03-17 VITALS — BP 170/91 | HR 70 | Resp 16 | Ht 64.0 in | Wt 139.0 lb

## 2011-03-17 DIAGNOSIS — I70219 Atherosclerosis of native arteries of extremities with intermittent claudication, unspecified extremity: Secondary | ICD-10-CM

## 2011-03-17 NOTE — Progress Notes (Signed)
[facility] HISTORY AND PHYSICAL   History of Present Illness:  Patient is a 77 y.o. year old female who presents for follow-up evaluation for PAD.  She is on Aspirin for antiplatelet therapy. Her atherosclerotic risk factors remain diabetes and elevated cholesterol.  These are all currently stable and followed by the primary care physician.  The patient currently has claudication symptoms that occur after walking one to 2 blocks. The patient states that her symptoms may be slightly worse over the last 2 years. However, she become short of breath usually before her legs get out.  The patient denies rest pain or ulcers on the feet. She does have a history of peripheral neuropathy.  Past Medical History  Diagnosis Date  . GERD (gastroesophageal reflux disease)   . Hyperlipidemia   . Type II or unspecified type diabetes mellitus with neurological manifestations, not stated as uncontrolled   . Corns and callosities   . Abdominal pain, epigastric   . Rash and other nonspecific skin eruption   . Diaphragmatic hernia without mention of obstruction or gangrene   . Hiatal hernia   . Internal hemorrhoids without mention of complication   . History of colon polyps 2009    Hyperplastic  . Hx of adenomatous colonic polyps 2009  . Diverticulosis of colon (without mention of hemorrhage) 2009  . IBS (irritable bowel syndrome)   . Anxiety   . Status post dilation of esophageal narrowing   . Thyroid disease   . Diabetes mellitus     Past Surgical History  Procedure Date  . Thyroidectomy, partial   . Abdominal hysterectomy   . Bmd 2007  . Esophagogastroduodenoscopy 2007  . Laparoscopic nissen fundoplication 2009  . Cataract extraction   . Hernia repair     Review of Systems:  Neurologic: sensation in the feet is intact Cardiac:denies chest pain Pulmonary: denies cough or wheeze  Social History History  Substance Use Topics  . Smoking status: Former Smoker   Quit date: 03/10/1981  . Smokeless tobacco: Never Used  . Alcohol Use: No    Allergies  Allergies  Allergen Reactions  . Codeine     Rapid heart rate  . Metformin     unspecified  . Penicillins     unspecified  . Pioglitazone     REACTION: rash     Current Outpatient Prescriptions  Medication Sig Dispense Refill  . aspirin 81 MG tablet Take 81 mg by mouth daily.        Marland Kitchen Dexlansoprazole (DEXILANT PO) Take by mouth daily.        . fish oil-omega-3 fatty acids 1000 MG capsule Take 2 g by mouth daily.        Marland Kitchen FREESTYLE LITE test strip USE AS DIRECTED  100 each  4  . glimepiride (AMARYL) 4 MG tablet TAKE ONE TABLET BY MOUTH TWICE DAILY. DUE  FOR  LABS  60 tablet  0  . JANUVIA 100 MG tablet TAKE ONE TABLET BY MOUTH EVERY DAY  30 each  0    Physical Examination  Filed Vitals:   03/17/11 1439  BP: 170/91  Pulse: 70  Resp: 16  Height: 5\' 4"  (1.626 m)  Weight: 139 lb (63.05 kg)  SpO2: 97%    Body mass index is 23.86 kg/(m^2).  General:  Alert and oriented, no acute distress HEENT: Normal Neck: No bruit or JVD Pulmonary: Clear to auscultation bilaterally Cardiac: Regular Rate and Rhythm without murmur Neurologic: Upper and lower  extremity motor 5/5 and symmetric Extremities:  2+ femoral absent popliteal and pedal pulses Skin: no ulcer or rash  DATA: She had bilateral ABIs with toe pressures today. Right ear pressure was 62 left was 43 ABI was 0.55 on the left 0.50 on the right. These are fairly consistent with her numbers dating back all the way to March of 2010.   ASSESSMENT: Bilateral lower extremity PAD with stable symptoms at this point. I believe continued conservative management would be best for her. She has not really disabled much by her symptoms at this point. She is not interested in any intervention at this point.  PLAN: She will followup in 6 months time with repeat ABIs and toe pressures. If her symptoms become worse over time and more debilitating or if  she develops rest pain or tissue loss we would consider arteriogram for possible revascularization. However, I believe that currently her shortness of breath limits her as much as if not more than her PAD.  Fabienne Bruns, MD [facility] Office: [phone] Pager: [phone]

## 2011-03-18 ENCOUNTER — Ambulatory Visit (INDEPENDENT_AMBULATORY_CARE_PROVIDER_SITE_OTHER): Payer: Medicare Other | Admitting: Family Medicine

## 2011-03-18 ENCOUNTER — Encounter: Payer: Self-pay | Admitting: Family Medicine

## 2011-03-18 DIAGNOSIS — E785 Hyperlipidemia, unspecified: Secondary | ICD-10-CM

## 2011-03-18 DIAGNOSIS — I10 Essential (primary) hypertension: Secondary | ICD-10-CM

## 2011-03-18 DIAGNOSIS — E119 Type 2 diabetes mellitus without complications: Secondary | ICD-10-CM

## 2011-03-18 LAB — LIPID PANEL
HDL: 38.7 mg/dL — ABNORMAL LOW (ref >39.00)
Triglycerides: 350 mg/dL — ABNORMAL HIGH (ref 0.0–149.0)

## 2011-03-18 LAB — CBC WITH DIFFERENTIAL/PLATELET
Basophils Absolute: 0.1 10*3/uL (ref 0.0–0.1)
Basophils Relative: 0.7 % (ref 0.0–3.0)
Eosinophils Absolute: 0.2 10*3/uL (ref 0.0–0.7)
HCT: 39.7 % (ref 36.0–46.0)
Hemoglobin: 13.2 g/dL (ref 12.0–15.0)
Lymphs Abs: 2.3 10*3/uL (ref 0.7–4.0)
MCHC: 33.3 g/dL (ref 30.0–36.0)
MCV: 79.9 fl (ref 78.0–100.0)
Neutro Abs: 4.4 10*3/uL (ref 1.4–7.7)
RBC: 4.97 Mil/uL (ref 3.87–5.11)
RDW: 15.4 % — ABNORMAL HIGH (ref 11.5–14.6)

## 2011-03-18 LAB — BASIC METABOLIC PANEL
CO2: 27 mEq/L (ref 19–32)
Chloride: 105 mEq/L (ref 96–112)
Glucose, Bld: 134 mg/dL — ABNORMAL HIGH (ref 70–99)
Potassium: 3.8 mEq/L (ref 3.5–5.1)
Sodium: 139 mEq/L (ref 135–145)

## 2011-03-18 LAB — MICROALBUMIN / CREATININE URINE RATIO
Creatinine,U: 117.1 mg/dL
Microalb, Ur: 36 mg/dL — ABNORMAL HIGH (ref 0.0–1.9)

## 2011-03-18 LAB — LDL CHOLESTEROL, DIRECT: Direct LDL: 105.1 mg/dL

## 2011-03-18 LAB — HEPATIC FUNCTION PANEL
ALT: 18 U/L (ref 0–35)
Albumin: 3.6 g/dL (ref 3.5–5.2)
Total Protein: 7.4 g/dL (ref 6.0–8.3)

## 2011-03-18 LAB — POCT URINALYSIS DIPSTICK
Glucose, UA: NEGATIVE
Ketones, UA: NEGATIVE
Leukocytes, UA: NEGATIVE
Protein, UA: 30
Urobilinogen, UA: 0.2

## 2011-03-18 MED ORDER — GLIMEPIRIDE 4 MG PO TABS: ORAL_TABLET | ORAL | Status: DC

## 2011-03-18 NOTE — Progress Notes (Signed)
  Subjective:     [name] is a 77 y.o. female who presents for follow up of diabetes.. Current symptoms include: none. Patient denies all. Evaluation to date has been: fasting blood sugar, fasting lipid panel, hemoglobin A1C and microalbuminuria. Home sugars: BGs consistently in an acceptable range. Current treatments: more intensive attention to diet which has been effective, increased aerobic exercise which has been effective, Continued sulfonylurea which has been somewhat effective and Continued statin which has been effective. Pt stopped [place] because it was too expensive.   Last dilated eye exam -- this year.    The following portions of the patient's history were reviewed and updated as appropriate: allergies, current medications, past family history, past medical history, past social history, past surgical history and problem list.  Review of Systems Pertinent items are noted in HPI.    Objective:    BP 110/72  Pulse 70  Temp(Src) 97.5 F (36.4 C) (Oral)  Wt 136 lb 12.8 oz (62.052 kg)  SpO2 96% General appearance: alert, cooperative, appears stated age and no distress Eyes: conjunctivae/corneas clear. PERRL, EOM's intact. Fundi benign. Ears: normal TM's and external ear canals both ears Nose: Nares normal. Septum midline. Mucosa normal. No drainage or sinus tenderness. Throat: lips, mucosa, and tongue normal; teeth and gums normal Neck: no adenopathy, no carotid bruit, no JVD, supple, symmetrical, trachea midline and thyroid not enlarged, symmetric, no tenderness/mass/nodules Lungs: clear to auscultation bilaterally Heart: regular rate and rhythm, S1, S2 normal, no murmur, click, rub or gallop Extremities: extremities normal, atraumatic, no cyanosis or edema   Foot exam-- done yesterday per pt--she did not want to take off her shoes.  Patient was not evaluated for proper footwear and sizing.  Laboratory: No components found with this basename: A1C      Assessment:    Hyperlipidemia--- con't meds,  Check labs  HTN---stable,  con't meds  DMII Plan:    Addressed ADA diet. Suggested low cholesterol diet. Encouraged aerobic exercise. Discussed foot care. Reminded to get yearly retinal exam. Continued sulfonylurea; see medication orders.   Check labs

## 2011-03-18 NOTE — Patient Instructions (Signed)

## 2011-03-30 ENCOUNTER — Encounter: Payer: Self-pay | Admitting: Internal Medicine

## 2011-03-30 ENCOUNTER — Ambulatory Visit (AMBULATORY_SURGERY_CENTER): Payer: Medicare Other | Admitting: Internal Medicine

## 2011-03-30 VITALS — BP 133/77 | HR 68 | Temp 97.3°F | Resp 19 | Ht 64.0 in | Wt 138.0 lb

## 2011-03-30 DIAGNOSIS — R1310 Dysphagia, unspecified: Secondary | ICD-10-CM

## 2011-03-30 DIAGNOSIS — K222 Esophageal obstruction: Secondary | ICD-10-CM

## 2011-03-30 LAB — GLUCOSE, CAPILLARY
Glucose-Capillary: 141 mg/dL — ABNORMAL HIGH (ref 70–99)
Glucose-Capillary: 125 mg/dL — ABNORMAL HIGH (ref 70–99)

## 2011-03-30 MED ORDER — SODIUM CHLORIDE 0.9 % IV SOLN: 500.0000 mL | INTRAVENOUS | Status: DC

## 2011-03-30 NOTE — Progress Notes (Signed)
Patient did not experience any of the following events: a burn prior to discharge; a fall within the facility; wrong site/side/patient/procedure/implant event; or a hospital transfer or hospital admission upon discharge from the facility. (G8907) Patient did not have preoperative order for IV antibiotic SSI prophylaxis. (G8918)  

## 2011-03-30 NOTE — Op Note (Signed)
[facility] [address] [place], [place]  [address]  ENDOSCOPY PROCEDURE REPORT  PATIENT:  [name], [name]  MR#:  [id number] BIRTHDATE:  1929/07/22, 81 yrs. old  GENDER:  female  ENDOSCOPIST:  Wilhemina Bonito. Eda Keys, MD Referred by:  .Direct  PROCEDURE DATE:  03/30/2011 PROCEDURE:  EGD with balloon dilatation - 15-16.5-18mm ASA CLASS:  Class II INDICATIONS:  dilation of esophageal stricture  MEDICATIONS:   Fentanyl 50 mcg IV, Versed 5 mg IV, These medications were titrated to patient response per physician's verbal order TOPICAL ANESTHETIC:  Cetacaine Spray  DESCRIPTION OF PROCEDURE:   After the risks benefits and alternatives of the procedure were thoroughly explained, informed consent was obtained.  The [id number] endoscope was introduced through the mouth and advanced to the second portion of the duodenum, without limitations.  The instrument was slowly withdrawn as the mucosa was fully examined. <<PROCEDUREIMAGES>>  A 12mm stricture was found in the distal esophagus with active Esophagitis.Improved from last exam. The stomach was entered and closely examined. The antrum, angularis, and lesser curvature were well visualized, including a retroflexed view of the cardia and fundus. The stomach wall was normally distensable. The scope passed easily through the pylorus into the duodenum.  The duodenal bulb was normal in appearance, as was the postbulbar duodenum. Retroflexed views revealed a hiatal hernia.  THERAPY: TTS BALLOON SEQUENTIALLY DILATED TO MAXIMAL DIAMETER OF .MINIMAL RESISTANCE.  + HEME. TOLERATED WELL.Marland KitchenMarland KitchenThe scope was then withdrawn from the patient and the procedure completed.  COMPLICATIONS:  None  ENDOSCOPIC IMPRESSION: 1) Stricture in the distal esophagus - S/P BALLOON DILATION TO 2) Esophagitis in the distal esophagus 3) A hiatal hernia  RECOMMENDATIONS: 1) Clear liquids until 3 PM, then soft foods rest of day. Resume prior  diet tomorrow. 2) INCREASE OMEPRAZOLE TO 40 MG DAILY 3) Follow clinical response to dilatation. PLEASE CALL ME WHEN SWALLOWING STARTS TO BECOME AT ALL DIFFICULT, AS YOU WILL NEED A REPEAT DILAION.  ______________________________ Wilhemina Bonito. Eda Keys, MD  CC:  Lelon Perla, DO;  The Patient  n. eSIGNED:   Lenward Able N. Eda Keys at 03/30/2011 12:12 PM  Neta Mends, [phone]

## 2011-03-30 NOTE — Patient Instructions (Signed)
Please review discharge instructions (blue and green sheets)  Follow dilation diet today  Increase Omeprazole to 40 mg daily  PLEASE CALL OFFICE WHEN SWALLOWING STARTS TO BECOME AT ALL DIFFICULT, AS YOU WILL NEED A REPEAT DILATION

## 2011-03-31 ENCOUNTER — Telehealth: Payer: Self-pay

## 2011-03-31 NOTE — Telephone Encounter (Signed)

## 2011-04-06 MED ORDER — SIMVASTATIN 20 MG PO TABS: 20.0000 mg | ORAL_TABLET | Freq: Every evening | ORAL | Status: DC

## 2011-09-14 ENCOUNTER — Encounter: Payer: Self-pay | Admitting: Neurosurgery

## 2011-09-15 ENCOUNTER — Ambulatory Visit (INDEPENDENT_AMBULATORY_CARE_PROVIDER_SITE_OTHER): Payer: Medicare Other | Admitting: *Deleted

## 2011-09-15 ENCOUNTER — Encounter: Payer: Self-pay | Admitting: Neurosurgery

## 2011-09-15 ENCOUNTER — Ambulatory Visit (INDEPENDENT_AMBULATORY_CARE_PROVIDER_SITE_OTHER): Payer: Medicare Other | Admitting: Neurosurgery

## 2011-09-15 ENCOUNTER — Ambulatory Visit: Payer: Medicare Other | Admitting: Vascular Surgery

## 2011-09-15 VITALS — BP 156/76 | HR 79 | Resp 16 | Ht 64.0 in | Wt 140.0 lb

## 2011-09-15 DIAGNOSIS — I7092 Chronic total occlusion of artery of the extremities: Secondary | ICD-10-CM | POA: Insufficient documentation

## 2011-09-15 DIAGNOSIS — I739 Peripheral vascular disease, unspecified: Secondary | ICD-10-CM

## 2011-09-15 NOTE — Progress Notes (Addendum)
[facility] HISTORY AND PHYSICAL   CC: Six-month lower extremity ABIs for known PVD Referring Physician: Fields  History of Present Illness: 78 year old female patient of Dr. Darrick Penna followed for known peripheral vascular disease. The patient states she still has claudication with ambulation, she has no rest pain or no open wounds or lower extremities. Patient states she does not wish for any intervention at this point she is "status quo" and would like to continue conservative treatment.  Past Medical History  Diagnosis Date  . GERD (gastroesophageal reflux disease)   . Hyperlipidemia   . Type II or unspecified type diabetes mellitus with neurological manifestations, not stated as uncontrolled   . Corns and callosities   . Abdominal pain, epigastric   . Rash and other nonspecific skin eruption   . Diaphragmatic hernia without mention of obstruction or gangrene   . Hiatal hernia   . Internal hemorrhoids without mention of complication   . History of colon polyps 2009    Hyperplastic  . Hx of adenomatous colonic polyps 2009  . Diverticulosis of colon (without mention of hemorrhage) 2009  . IBS (irritable bowel syndrome)   . Anxiety   . Status post dilation of esophageal narrowing   . Thyroid disease   . Diabetes mellitus     ROS: [x]  Positive   [ ]  Denies    General: [ ]  Weight loss, [ ]  Fever, [ ]  chills Neurologic: [ ]  Dizziness, [ ]  Blackouts, [ ]  Seizure [ ]  Stroke, [ ]  "Mini stroke", [ ]  Slurred speech, [ ]  Temporary blindness; [ ]  weakness in arms or legs, [ ]  Hoarseness Cardiac: [ ]  Chest pain/pressure, [ ]  Shortness of breath at rest [x ] Shortness of breath with exertion, [ ]  Atrial fibrillation or irregular heartbeat Vascular: [x ] Pain in legs with walking, [x ] Pain in legs at rest, [ ]  Pain in legs at night,  [ ]  Non-healing ulcer, [ ]  Blood clot in vein/DVT,   Pulmonary: [ ]  Home oxygen, [ ]  Productive cough, [ ]  Coughing up blood, [ ]   Asthma,  [ ]  Wheezing Musculoskeletal:  [ ]  Arthritis, [ ]  Low back pain, [ ]  Joint pain Hematologic: [ ]  Easy Bruising, [ ]  Anemia; [ ]  Hepatitis Gastrointestinal: [ ]  Blood in stool, [ ]  Gastroesophageal Reflux/heartburn, [ ]  Trouble swallowing Urinary: [ ]  chronic Kidney disease, [ ]  on HD - [ ]  MWF or [ ]  TTHS, [ ]  Burning with urination, [ ]  Difficulty urinating Skin: [ ]  Rashes, [ ]  Wounds Psychological: [ ]  Anxiety, [ ]  Depression   Social History History  Substance Use Topics  . Smoking status: Former Smoker    Quit date: 03/10/1981  . Smokeless tobacco: Never Used  . Alcohol Use: No    Family History Family History  Problem Relation Age of Onset  . Diabetes Maternal Grandfather   . Diabetes Son   . Hyperlipidemia Son   . Heart disease Paternal Grandmother   . Uterine cancer Mother   . Cancer Mother   . Breast cancer Paternal Aunt   . Colon cancer Neg Hx   . Stomach cancer Neg Hx     Allergies  Allergen Reactions  . Codeine     Rapid heart rate  . Metformin     unspecified  . Penicillins     unspecified  . Pioglitazone     REACTION: rash    Current Outpatient Prescriptions  Medication Sig Dispense Refill  .  aspirin 81 MG tablet Take 81 mg by mouth daily.        . cholecalciferol (VITAMIN D) 1000 UNITS tablet Take 1,000 Units by mouth daily.        . fish oil-omega-3 fatty acids 1000 MG capsule Take 2 g by mouth daily.        Marland Kitchen FREESTYLE LITE test strip USE AS DIRECTED  100 each  4  . glimepiride (AMARYL) 4 MG tablet 1 po bid  60 tablet  5  . glucose blood test strip Daily.      Marland Kitchen omeprazole (PRILOSEC) 10 MG capsule Take 10 mg by mouth daily.        Marland Kitchen Dexlansoprazole (DEXILANT PO) Take by mouth daily.        . simvastatin (ZOCOR) 20 MG tablet Take 1 tablet (20 mg total) by mouth every evening.  30 tablet  2    Physical Examination  Filed Vitals:   09/15/11 1422  BP: 156/76  Pulse: 79  Resp: 16    Body mass index is 24.03 kg/(m^2).  General:   WDWN in NAD Gait: Normal HEENT: WNL Eyes: Pupils equal Pulmonary: normal non-labored breathing , without Rales, rhonchi,  wheezing Cardiac: RRR, without  Murmurs, rubs or gallops; No carotid bruits Abdomen: soft, NT, no masses Skin: no rashes, ulcers noted Vascular Exam/Pulses: Palpable femoral pulses bilaterally lower extremity pulses are not palpable  Extremities without ischemic changes, no Gangrene , no cellulitis; no open wounds;  Musculoskeletal: no muscle wasting or atrophy  Neurologic: A&O X 3; Appropriate Affect ; SENSATION: normal; MOTOR FUNCTION:  moving all extremities equally. Speech is fluent/normal  Non-Invasive Vascular Imaging: ABIs today are 0.54 on the right 0.56 on the left which is consistent with previous exams.  ASSESSMENT/PLAN: Patient with claudication symptoms that are chronic. The patient wishes for no intervention at this time just conservative management. Therefore we will see the patient back in 6 months for repeat ABIs. She knows to contact our office if symptoms worsen.  Lauree Chandler ANP  Clinic M.D.: [facility]

## 2011-10-25 ENCOUNTER — Other Ambulatory Visit: Payer: Self-pay | Admitting: Family Medicine

## 2011-12-06 ENCOUNTER — Telehealth: Payer: Self-pay | Admitting: *Deleted

## 2011-12-06 ENCOUNTER — Encounter: Payer: Self-pay | Admitting: Family Medicine

## 2011-12-06 ENCOUNTER — Ambulatory Visit (INDEPENDENT_AMBULATORY_CARE_PROVIDER_SITE_OTHER): Payer: Medicare Other | Admitting: Family Medicine

## 2011-12-06 VITALS — BP 120/72 | HR 71 | Temp 97.7°F | Wt 135.2 lb

## 2011-12-06 DIAGNOSIS — E785 Hyperlipidemia, unspecified: Secondary | ICD-10-CM

## 2011-12-06 DIAGNOSIS — I739 Peripheral vascular disease, unspecified: Secondary | ICD-10-CM | POA: Insufficient documentation

## 2011-12-06 DIAGNOSIS — E1165 Type 2 diabetes mellitus with hyperglycemia: Secondary | ICD-10-CM

## 2011-12-06 DIAGNOSIS — IMO0001 Reserved for inherently not codable concepts without codable children: Secondary | ICD-10-CM

## 2011-12-06 DIAGNOSIS — N390 Urinary tract infection, site not specified: Secondary | ICD-10-CM

## 2011-12-06 LAB — POCT URINALYSIS DIPSTICK
Nitrite, UA: NEGATIVE
Urobilinogen, UA: 0.2
pH, UA: 6

## 2011-12-06 LAB — MICROALBUMIN / CREATININE URINE RATIO
Creatinine,U: 195.5 mg/dL
Microalb Creat Ratio: 62.6 mg/g — ABNORMAL HIGH (ref 0.0–30.0)

## 2011-12-06 LAB — BASIC METABOLIC PANEL
Chloride: 100 mEq/L (ref 96–112)
Potassium: 4.2 mEq/L (ref 3.5–5.1)

## 2011-12-06 LAB — HEPATIC FUNCTION PANEL
AST: 15 U/L (ref 0–37)
Alkaline Phosphatase: 145 U/L — ABNORMAL HIGH (ref 39–117)
Total Bilirubin: 0.7 mg/dL (ref 0.3–1.2)

## 2011-12-06 LAB — LIPID PANEL
Total CHOL/HDL Ratio: 7
VLDL: 124.8 mg/dL — ABNORMAL HIGH (ref 0.0–40.0)

## 2011-12-06 LAB — HEMOGLOBIN A1C: Hgb A1c MFr Bld: 10.6 % — ABNORMAL HIGH (ref 4.6–6.5)

## 2011-12-06 MED ORDER — INSULIN ASPART 100 UNIT/ML ~~LOC~~ SOLN: 10.0000 [IU] | Freq: Three times a day (TID) | SUBCUTANEOUS | Status: DC

## 2011-12-06 MED ORDER — INSULIN ASPART 100 UNIT/ML ~~LOC~~ SOLN: SUBCUTANEOUS | Status: DC

## 2011-12-06 MED ORDER — COENZYME Q10 200 MG PO CAPS: 200.0000 mg | ORAL_CAPSULE | Freq: Every day | ORAL | Status: DC

## 2011-12-06 MED ORDER — PRAVASTATIN SODIUM 20 MG PO TABS: 20.0000 mg | ORAL_TABLET | Freq: Every day | ORAL | Status: DC

## 2011-12-06 MED ORDER — NONFORMULARY OR COMPOUNDED ITEM: Status: DC

## 2011-12-06 MED ORDER — INSULIN DETEMIR 100 UNIT/ML ~~LOC~~ SOLN: 10.0000 [IU] | Freq: Every day | SUBCUTANEOUS | Status: DC

## 2011-12-06 NOTE — Progress Notes (Signed)
  Subjective:     [name] is a 78 y.o. female who presents for follow up of diabetes.. Current symptoms include: hyperglycemia. Patient denies foot ulcerations, hypoglycemia , increased appetite, nausea, paresthesia of the feet, polydipsia, polyuria, visual disturbances, vomiting and weight loss. Evaluation to date has been: fasting blood sugar, fasting lipid panel, hemoglobin A1C and microalbuminuria. Home sugars: have been running high---300. Current treatments: none and pt stopped all meds.  She was afraid of side effects. Last dilated eye exam last year.   The following portions of the patient's history were reviewed and updated as appropriate: allergies, current medications, past family history, past medical history, past social history, past surgical history and problem list.  Review of Systems Pertinent items are noted in HPI.    Objective:    BP 120/72  Pulse 71  Temp 97.7 F (36.5 C) (Oral)  Wt 135 lb 3.2 oz (61.326 kg)  SpO2 98% General appearance: alert, cooperative, appears stated age and no distress Eyes: conjunctivae/corneas clear. PERRL, EOM's intact. Fundi benign. Lungs: clear to auscultation bilaterally Heart: S1, S2 normal Extremities: extremities normal, atraumatic, no cyanosis or edema  Sensory exam of the foot is normal, tested with the monofilament. Good pulses, no lesions or ulcers, good peripheral pulses.   Patient was evaluated for proper footwear and sizing.  Laboratory: No components found with this basename: A1C      Assessment:    Diabetes mellitus Type II, under poor control.   hyperlipidemia Plan:    Agricultural engineer distributed. Addressed ADA diet. Encouraged aerobic exercise. Discussed foot care. Reminded to get yearly retinal exam. Started insulin: levemir and novolog. Restarted statin drug see medication orders. Labs: fasting blood sugar, fasting lipid panel, hemoglobin A1C and microalbuminuria. Reminded to bring in blood sugar  diary at next visit. Follow up in 2 weeks or as needed.

## 2011-12-06 NOTE — Telephone Encounter (Signed)
Pt left vm on triage line stating name and DOB, hung up

## 2011-12-06 NOTE — Patient Instructions (Signed)

## 2011-12-06 NOTE — Addendum Note (Signed)
Addended by: Silvio Pate D on: 12/06/2011 12:01 PM   Modules accepted: Orders

## 2011-12-06 NOTE — Telephone Encounter (Signed)
Patient stated the flex pen for the Novolog and the Levemir is going to cost her $240 and she would like to have the vials called in to see how much that is going to cost her.      KP

## 2011-12-07 MED ORDER — "INSULIN SYRINGE/NEEDLE 28G X 1/2"" 0.5 ML MISC": Status: DC

## 2011-12-07 MED ORDER — INSULIN ASPART 100 UNIT/ML ~~LOC~~ SOLN: SUBCUTANEOUS | Status: DC

## 2011-12-07 MED ORDER — INSULIN DETEMIR 100 UNIT/ML ~~LOC~~ SOLN: 10.0000 [IU] | Freq: Every day | SUBCUTANEOUS | Status: DC

## 2011-12-07 NOTE — Telephone Encounter (Signed)
Ok to change to vials and syringes

## 2011-12-07 NOTE — Telephone Encounter (Signed)
Rx sent and I made the patient aware.     KP

## 2011-12-07 NOTE — Addendum Note (Signed)
Addended by: Arnette Norris on: 12/07/2011 12:17 PM   Modules accepted: Orders

## 2011-12-08 LAB — URINE CULTURE

## 2011-12-14 ENCOUNTER — Telehealth: Payer: Self-pay

## 2011-12-14 NOTE — Telephone Encounter (Signed)
Documented pt refused referrals

## 2011-12-14 NOTE — Telephone Encounter (Signed)
Message copied by Arnette Norris on Wed Dec 14, 2011  2:27 PM ------      Message from: Lelon Perla      Created: Wed Dec 07, 2011 11:31 AM       DM and chol out of control----  Take insulin as prescribed ,  Refer to endo      Alk phos high---  Repeat fractionated alk phos      Elevated microalbuin----  24 hour urine for protein and refer to nephrology

## 2011-12-14 NOTE — Telephone Encounter (Signed)
Discussed with patient and she declined the referrals, she wants to wait to see if she we can get her DM under control with what she is doing. She also wants to wait on the results from the 24 hour urine before she goes to the nephrologist.     KP

## 2011-12-21 ENCOUNTER — Ambulatory Visit (INDEPENDENT_AMBULATORY_CARE_PROVIDER_SITE_OTHER): Payer: Medicare Other | Admitting: Family Medicine

## 2011-12-21 ENCOUNTER — Encounter: Payer: Self-pay | Admitting: Family Medicine

## 2011-12-21 DIAGNOSIS — E1165 Type 2 diabetes mellitus with hyperglycemia: Secondary | ICD-10-CM

## 2011-12-21 DIAGNOSIS — E119 Type 2 diabetes mellitus without complications: Secondary | ICD-10-CM

## 2011-12-21 DIAGNOSIS — IMO0001 Reserved for inherently not codable concepts without codable children: Secondary | ICD-10-CM

## 2011-12-21 DIAGNOSIS — IMO0002 Reserved for concepts with insufficient information to code with codable children: Secondary | ICD-10-CM

## 2011-12-21 DIAGNOSIS — E785 Hyperlipidemia, unspecified: Secondary | ICD-10-CM

## 2011-12-21 MED ORDER — INSULIN ASPART 100 UNIT/ML ~~LOC~~ SOLN: SUBCUTANEOUS | Status: DC

## 2011-12-21 MED ORDER — INSULIN DETEMIR 100 UNIT/ML ~~LOC~~ SOLN: SUBCUTANEOUS | Status: DC

## 2011-12-21 MED ORDER — ATORVASTATIN CALCIUM 10 MG PO TABS: ORAL_TABLET | ORAL | Status: DC

## 2011-12-21 MED ORDER — GLUCOSE BLOOD VI STRP: ORAL_STRIP | Status: DC

## 2011-12-21 NOTE — Patient Instructions (Signed)

## 2011-12-21 NOTE — Assessment & Plan Note (Signed)
Inc levemir to 1 u qpm Inc novolog 10 u tid rto 3 months---drop off BS in 2-3 weeks

## 2011-12-21 NOTE — Progress Notes (Signed)
  Subjective:    Patient ID: [name], female    DOB: [birth year], 78 y.o.   MRN: [id number]  HPIPt here to f/u diabetes.  Her fasting bs are 180s and during day in 200s.   She is also complaining about muscle aches from pravastatin. No other complaints. Overall she is feeling a lot better.    Review of Systems As above    Objective:   Physical Exam  Constitutional: She appears well-developed and well-nourished.  Psychiatric: She has a normal mood and affect. Her behavior is normal. Judgment and thought content normal.          Assessment & Plan:

## 2011-12-21 NOTE — Assessment & Plan Note (Signed)
Pt having myalgias from pravastain---change to lipitor 3x a week

## 2012-01-05 ENCOUNTER — Encounter: Payer: Self-pay | Admitting: Internal Medicine

## 2012-02-27 ENCOUNTER — Other Ambulatory Visit: Payer: Self-pay | Admitting: Family Medicine

## 2012-02-27 DIAGNOSIS — E1165 Type 2 diabetes mellitus with hyperglycemia: Secondary | ICD-10-CM

## 2012-02-27 NOTE — Telephone Encounter (Signed)
Pt came in and needs you call her because she would like some kind of rx

## 2012-02-28 MED ORDER — INSULIN DETEMIR 100 UNIT/ML ~~LOC~~ SOLN: SUBCUTANEOUS | Status: DC

## 2012-02-28 NOTE — Telephone Encounter (Signed)
Patient was requesting the Levemir flexpen instead of the vials. Rx faxed     KP

## 2012-03-20 ENCOUNTER — Other Ambulatory Visit: Payer: Self-pay | Admitting: *Deleted

## 2012-03-20 DIAGNOSIS — I70219 Atherosclerosis of native arteries of extremities with intermittent claudication, unspecified extremity: Secondary | ICD-10-CM

## 2012-03-21 ENCOUNTER — Encounter: Payer: Self-pay | Admitting: Neurosurgery

## 2012-03-22 ENCOUNTER — Ambulatory Visit (INDEPENDENT_AMBULATORY_CARE_PROVIDER_SITE_OTHER): Payer: Medicare Other | Admitting: Vascular Surgery

## 2012-03-22 ENCOUNTER — Ambulatory Visit (INDEPENDENT_AMBULATORY_CARE_PROVIDER_SITE_OTHER): Payer: Medicare Other | Admitting: Neurosurgery

## 2012-03-22 ENCOUNTER — Encounter: Payer: Self-pay | Admitting: Neurosurgery

## 2012-03-22 VITALS — BP 166/79 | HR 69 | Resp 16 | Ht 63.5 in | Wt 144.0 lb

## 2012-03-22 DIAGNOSIS — I739 Peripheral vascular disease, unspecified: Secondary | ICD-10-CM

## 2012-03-22 DIAGNOSIS — I7092 Chronic total occlusion of artery of the extremities: Secondary | ICD-10-CM

## 2012-03-22 DIAGNOSIS — Z8739 Personal history of other diseases of the musculoskeletal system and connective tissue: Secondary | ICD-10-CM

## 2012-03-22 DIAGNOSIS — I70219 Atherosclerosis of native arteries of extremities with intermittent claudication, unspecified extremity: Secondary | ICD-10-CM

## 2012-03-22 NOTE — Progress Notes (Signed)
Ankle brachial index performed @ [facility] 03/22/2012

## 2012-03-22 NOTE — Progress Notes (Signed)
[facility] [place] PAD/PVD Office Note  CC: PVD surveillance Referring Physician: Fields  History of Present Illness:78 -year-old female patient of [name] followed for bilateral lower extremity claudication. The patient states she does not have true rest pain but does have peripheral neuropathy and nerve damage in her feet and they do have pain but this is nothing new and has not really described as vascular rest pain. The patient denies any open ulcerations on her lower extremities, new medical diagnoses or recent surgery.  Past Medical History  Diagnosis Date  . GERD (gastroesophageal reflux disease)   . Hyperlipidemia   . Type II or unspecified type diabetes mellitus with neurological manifestations, not stated as uncontrolled(250.60)   . Corns and callosities   . Abdominal pain, epigastric   . Rash and other nonspecific skin eruption   . Diaphragmatic hernia without mention of obstruction or gangrene   . Hiatal hernia   . Internal hemorrhoids without mention of complication   . History of colon polyps 2009    Hyperplastic  . Hx of adenomatous colonic polyps 2009  . Diverticulosis of colon (without mention of hemorrhage) 2009  . IBS (irritable bowel syndrome)   . Anxiety   . Status post dilation of esophageal narrowing   . Thyroid disease   . Diabetes mellitus     ROS: [x]  Positive   [ ]  Denies    General: [ ]  Weight loss, [ ]  Fever, [ ]  chills Neurologic: [ ]  Dizziness, [ ]  Blackouts, [ ]  Seizure [ ]  Stroke, [ ]  "Mini stroke", [ ]  Slurred speech, [ ]  Temporary blindness; [ ]  weakness in arms or legs, [ ]  Hoarseness Cardiac: [ ]  Chest pain/pressure, [ ]  Shortness of breath at rest [ ]  Shortness of breath with exertion, [ ]  Atrial fibrillation or irregular heartbeat Vascular: [ ]  Pain in legs with walking, [ ]  Pain in legs at rest, [ ]  Pain in legs at night,  [ ]  Non-healing ulcer, [ ]  Blood clot in vein/DVT,   Pulmonary: [ ]  Home oxygen, [ ]   Productive cough, [ ]  Coughing up blood, [ ]  Asthma,  [ ]  Wheezing Musculoskeletal:  [ ]  Arthritis, [ ]  Low back pain, [ ]  Joint pain Hematologic: [ ]  Easy Bruising, [ ]  Anemia; [ ]  Hepatitis Gastrointestinal: [ ]  Blood in stool, [ ]  Gastroesophageal Reflux/heartburn, [ ]  Trouble swallowing Urinary: [ ]  chronic Kidney disease, [ ]  on HD - [ ]  MWF or [ ]  TTHS, [ ]  Burning with urination, [ ]  Difficulty urinating Skin: [ ]  Rashes, [ ]  Wounds Psychological: [ ]  Anxiety, [ ]  Depression   Social History History  Substance Use Topics  . Smoking status: Former Smoker    Quit date: 03/10/1981  . Smokeless tobacco: Never Used  . Alcohol Use: No    Family History Family History  Problem Relation Age of Onset  . Diabetes Maternal Grandfather   . Diabetes Son   . Hyperlipidemia Son   . Heart disease Paternal Grandmother   . Uterine cancer Mother   . Cancer Mother   . Breast cancer Paternal Aunt   . Colon cancer Neg Hx   . Stomach cancer Neg Hx     Allergies  Allergen Reactions  . Codeine     Rapid heart rate  . Metformin     unspecified  . Penicillins     unspecified  . Pioglitazone     REACTION: rash    Current Outpatient Prescriptions  Medication Sig Dispense Refill  . aspirin 81 MG tablet Take 81 mg by mouth daily.        Marland Kitchen atorvastatin (LIPITOR) 10 MG tablet 1 po 3x a week  30 tablet  3  . cholecalciferol (VITAMIN D) 1000 UNITS tablet Take 1,000 Units by mouth daily.        . Coenzyme Q10 200 MG capsule Take 1 capsule (200 mg total) by mouth daily.  30 capsule  11  . Dexlansoprazole (DEXILANT PO) Take by mouth daily.        . fish oil-omega-3 fatty acids 1000 MG capsule Take 2 g by mouth daily.        Marland Kitchen glucose blood (FREESTYLE LITE) test strip Check Blood sugar four times a day dx 250.00 Freestyle lite meter  100 each  11  . glucose blood test strip Daily.      . insulin aspart (NOVOLOG) 100 UNIT/ML injection 10 units SQ before meals tid  1 vial  12  . insulin  detemir (LEVEMIR) 100 UNIT/ML injection 15 units sq qpm  3 mL  5  . NONFORMULARY OR COMPOUNDED ITEM Wheelchair----dx-- PAD, claudication with weakness  1 Device  0  . omeprazole (PRILOSEC) 10 MG capsule Take 10 mg by mouth daily.        . Glucose Blood (FREESTYLE LITE TEST VI)       . INS SYRINGE/NEEDLE .5CC/28G 28G X 1/2" 0.5 ML MISC Inject insulin as directed. Dx 250.00  100 each  12    Physical Examination  Filed Vitals:   03/22/12 1415  BP: 166/79  Pulse: 69  Resp: 16    Body mass index is 25.11 kg/(m^2).  General:  WDWN in NAD Gait: Normal HEENT: WNL Eyes: Pupils equal Pulmonary: normal non-labored breathing , without Rales, rhonchi,  wheezing Cardiac: RRR, without  Murmurs, rubs or gallops; No carotid bruits Abdomen: soft, NT, no masses Skin: no rashes, ulcers noted Vascular Exam/Pulses: Palpable femoral pulses bilaterally, lower extremity pulses are not operable  Extremities without ischemic changes, no Gangrene , no cellulitis; no open wounds;  Musculoskeletal: no muscle wasting or atrophy  Neurologic: A&O X 3; Appropriate Affect ; SENSATION: normal; MOTOR FUNCTION:  moving all extremities equally. Speech is fluent/normal  Non-Invasive Vascular Imaging: ABIs today are 0.46 and monophasic on the right, 0.47 monophasic on the left which is a slight decline from previous exam in June 2013.  ASSESSMENT/PLAN: Symptomatic patient with claudication for some time now. The patient does not want any further diagnostics or intervention at this time, she states she was to wait as long as she can. The patient will followup in 6 months with repeat ABIs and see [name] The patient's questions were encouraged and answered, she is in agreement with this plan. Sharlee Blew ANP  Clinic M.D.: Fields

## 2012-03-23 NOTE — Addendum Note (Signed)
Addended by: Sharee Pimple on: 03/23/2012 08:09 AM   Modules accepted: Orders

## 2012-04-20 ENCOUNTER — Encounter: Payer: Self-pay | Admitting: Family Medicine

## 2012-04-20 ENCOUNTER — Ambulatory Visit (INDEPENDENT_AMBULATORY_CARE_PROVIDER_SITE_OTHER)
Admission: RE | Admit: 2012-04-20 | Discharge: 2012-04-20 | Disposition: A | Discharge status: Home / Self Care | Payer: Medicare Other | Source: Ambulatory Visit | Attending: Family Medicine | Admitting: Family Medicine

## 2012-04-20 ENCOUNTER — Ambulatory Visit (INDEPENDENT_AMBULATORY_CARE_PROVIDER_SITE_OTHER): Payer: Medicare Other | Admitting: Family Medicine

## 2012-04-20 VITALS — BP 138/72 | HR 78 | Temp 97.9°F | Wt 142.0 lb

## 2012-04-20 DIAGNOSIS — N390 Urinary tract infection, site not specified: Secondary | ICD-10-CM

## 2012-04-20 DIAGNOSIS — I10 Essential (primary) hypertension: Secondary | ICD-10-CM

## 2012-04-20 DIAGNOSIS — R079 Chest pain, unspecified: Secondary | ICD-10-CM

## 2012-04-20 DIAGNOSIS — R0781 Pleurodynia: Secondary | ICD-10-CM

## 2012-04-20 DIAGNOSIS — E119 Type 2 diabetes mellitus without complications: Secondary | ICD-10-CM

## 2012-04-20 DIAGNOSIS — E785 Hyperlipidemia, unspecified: Secondary | ICD-10-CM

## 2012-04-20 LAB — POCT URINALYSIS DIPSTICK
Bilirubin, UA: NEGATIVE
Glucose, UA: NEGATIVE
Ketones, UA: NEGATIVE
Nitrite, UA: NEGATIVE

## 2012-04-20 LAB — LDL CHOLESTEROL, DIRECT: Direct LDL: 103.4 mg/dL

## 2012-04-20 LAB — BASIC METABOLIC PANEL
BUN: 17 mg/dL (ref 6–23)
Calcium: 8.9 mg/dL (ref 8.4–10.5)
Creatinine, Ser: 0.7 mg/dL (ref 0.4–1.2)
GFR: 86.97 mL/min (ref >60.00)

## 2012-04-20 LAB — HEPATIC FUNCTION PANEL
ALT: 13 U/L (ref 0–35)
Albumin: 3.5 g/dL (ref 3.5–5.2)
Total Bilirubin: 0.4 mg/dL (ref 0.3–1.2)
Total Protein: 7.3 g/dL (ref 6.0–8.3)

## 2012-04-20 LAB — LIPID PANEL
Cholesterol: 222 mg/dL — ABNORMAL HIGH (ref 0–200)
Total CHOL/HDL Ratio: 7
Triglycerides: 352 mg/dL — ABNORMAL HIGH (ref 0.0–149.0)

## 2012-04-20 LAB — CBC WITH DIFFERENTIAL/PLATELET
Eosinophils Relative: 2.2 % (ref 0.0–5.0)
Monocytes Relative: 8.9 % (ref 3.0–12.0)
Neutrophils Relative %: 60.8 % (ref 43.0–77.0)
Platelets: 239 10*3/uL (ref 150.0–400.0)
WBC: 9.8 10*3/uL (ref 4.5–10.5)

## 2012-04-20 LAB — HEMOGLOBIN A1C: Hgb A1c MFr Bld: 6.8 % — ABNORMAL HIGH (ref 4.6–6.5)

## 2012-04-20 NOTE — Addendum Note (Signed)
Addended by: Silvio Pate D on: 04/20/2012 03:49 PM   Modules accepted: Orders

## 2012-04-20 NOTE — Patient Instructions (Signed)
Rib Contusion  A rib contusion (bruise) can occur by a blow to the chest or by a fall against a hard object. Usually these will be much better in a couple weeks. If X-rays were taken today and there are no broken bones (fractures), the diagnosis of bruising is made. However, broken ribs may not show up for several days, or may be discovered later on a routine X-ray when signs of healing show up. If this happens to you, it does not mean that something was missed on the X-ray, but simply that it did not show up on the first X-rays. Earlier diagnosis will not usually change the treatment.  HOME CARE INSTRUCTIONS   · Avoid strenuous activity. Be careful during activities and avoid bumping the injured ribs. Activities that pull on the injured ribs and cause pain should be avoided, if possible.  · For the first day or two, an ice pack used every 20 minutes while awake may be helpful. Put ice in a plastic bag and put a towel between the bag and the skin.  · Eat a normal, well-balanced diet. Drink plenty of fluids to avoid constipation.  · Take deep breaths several times a day to keep lungs free of infection. Try to cough several times a day. Splint the injured area with a pillow while coughing to ease pain. Coughing can help prevent pneumonia.  · Wear a rib belt or binder only if told to do so by your caregiver. If you are wearing a rib belt or binder, you must do the breathing exercises as directed by your caregiver. If not used properly, rib belts or binders restrict breathing which can lead to pneumonia.  · Only take over-the-counter or prescription medicines for pain, discomfort, or fever as directed by your caregiver.  SEEK MEDICAL CARE IF:   · You or your child has an oral temperature above 102° F (38.9° C).  · Your baby is older than 3 months with a rectal temperature of 100.5° F (38.1° C) or higher for more than 1 day.  · You develop a cough, with thick or bloody sputum.  SEEK IMMEDIATE MEDICAL CARE IF:   · You  have difficulty breathing.  · You feel sick to your stomach (nausea), have vomiting or belly (abdominal) pain.  · You have worsening pain, not controlled with medications, or there is a change in the location of the pain.  · You develop sweating or radiation of the pain into the arms, jaw or shoulders, or become light headed or faint.  · You or your child has an oral temperature above 102° F (38.9° C), not controlled by medicine.  · Your or your baby is older than 3 months with a rectal temperature of 102° F (38.9° C) or higher.  · Your baby is 3 months old or younger with a rectal temperature of 100.4° F (38° C) or higher.  MAKE SURE YOU:   · Understand these instructions.  · Will watch your condition.  · Will get help right away if you are not doing well or get worse.  Document Released: 12/21/2000 Document Revised: 06/20/2011 Document Reviewed: 11/14/2007  [employer]® Patient Information ©2013 [employer].

## 2012-04-20 NOTE — Assessment & Plan Note (Signed)
Tenderness with palpation Check xray con't with occasional nsaid ekg--no acute changes Mammogram normal

## 2012-04-20 NOTE — Progress Notes (Signed)
  Subjective:    Patient ID: [name], female    DOB: 1929/07/19, 79 y.o.   MRN: [id number]  HPIP  Pt here c/o pain lat Left breast--- and ribs.  No known injury.      Review of Systems As above    Objective:   Physical Exam  BP 138/72  Pulse 78  Temp 97.9 F (36.6 C) (Oral)  Wt 142 lb (64.411 kg)  SpO2 97% General appearance: alert, cooperative, appears stated age and no distress Lungs: clear to auscultation bilaterally Breasts: positive findings: tenderness L lat chest/ ribs,  No masses palpated, no nipple dimpling, no axillary nodes   Heart: S1, S2 normal      Assessment & Plan:

## 2012-04-21 ENCOUNTER — Other Ambulatory Visit: Payer: Self-pay | Admitting: Family Medicine

## 2012-04-21 DIAGNOSIS — Z780 Asymptomatic menopausal state: Secondary | ICD-10-CM

## 2012-04-21 DIAGNOSIS — S2239XA Fracture of one rib, unspecified side, initial encounter for closed fracture: Secondary | ICD-10-CM

## 2012-04-24 ENCOUNTER — Telehealth: Payer: Self-pay | Admitting: Family Medicine

## 2012-04-24 ENCOUNTER — Ambulatory Visit: Payer: Medicare Other | Admitting: Family Medicine

## 2012-04-24 LAB — URINE CULTURE: Colony Count: >=100000

## 2012-04-24 NOTE — Telephone Encounter (Signed)
In reference to Order for Bone Density, when I called patient to schedule, patient declined.  Patient states she is already 79 years old and is in bad shape.  States if she did have bad results she would not take any more medications anyway.  States all she will take is Calcium.

## 2012-04-24 NOTE — Telephone Encounter (Signed)
noted 

## 2012-05-02 ENCOUNTER — Telehealth: Payer: Self-pay

## 2012-05-02 DIAGNOSIS — R809 Proteinuria, unspecified: Secondary | ICD-10-CM

## 2012-05-02 MED ORDER — FENOFIBRATE 160 MG PO TABS: 160.0000 mg | ORAL_TABLET | Freq: Every day | ORAL | Status: DC

## 2012-05-02 MED ORDER — SULFAMETHOXAZOLE-TRIMETHOPRIM 800-160 MG PO TABS: 1.0000 | ORAL_TABLET | Freq: Two times a day (BID) | ORAL | Status: DC

## 2012-05-02 NOTE — Telephone Encounter (Signed)
Notes Recorded by Arnette Norris, CMA on 04/30/2012 at 2:11 PM msg left on machine @ [phone] KP Notes Recorded by Lelon Perla, DO on 04/25/2012 at 12:06 PM TG high-- Add fenofibrate 160 mg 1 po qd , #30 2 refills  Dm controlled microalbumin high---- need 24 h urine and nephrology referral Recheck 3 months 250.00 272.4 Lipid, hep , hgba1c, Bmp, Notes Recorded by Arnette Norris, CMA on 04/25/2012 at 11:43 AM Please advise on additional labs---- KP Notes Recorded by Lelon Perla, DO on 04/24/2012 at 9:07 AM + UTI-- bactrim ds 1 po bid for 7 days   Discussed with patient and she voiced understanding. Med's have been sent and the ref has been put in.     [place]

## 2012-05-02 NOTE — Telephone Encounter (Signed)
Message copied by Arnette Norris on Wed May 02, 2012  3:43 PM ------      Message from: Lelon Perla      Created: Wed Apr 25, 2012 12:06 PM       TG high--  Add fenofibrate 160 mg  1 po qd  ,  #30  2 refills       Dm controlled      microalbumin high---- need 24 h urine and nephrology referral      Recheck 3 months  250.00  272.4  Lipid, hep , hgba1c,  Bmp,

## 2012-05-08 NOTE — Addendum Note (Signed)
Addended by: Silvio Pate D on: 05/08/2012 03:09 PM   Modules accepted: Orders

## 2012-05-10 LAB — PROTEIN, URINE, 24 HOUR: Protein, Urine: 16 mg/dL

## 2012-07-24 ENCOUNTER — Telehealth: Payer: Self-pay | Admitting: Family Medicine

## 2012-07-24 NOTE — Telephone Encounter (Signed)
Spoke with patient and she said she feels "woozy headed", having SOB but she has been having this for a long time and she is not going to the ED and would rather be seen tomorrow. She is not having any chest pain, but she is having weakness all over, SOB and dizziness. She said she is not dehydrated because she is drinking plenty of fluids. ED visit has been declined.   KP

## 2012-07-24 NOTE — Telephone Encounter (Signed)
Patient Refused Recommendation:  Patient Refused Appt, Patient Requests Appt At Later Date  Caller has appointment for 07/25/12 at 1430 and does not want to go to the Emergency Room- Caller will go for evaluation over night if she worsens.    To MD for review      KP

## 2012-07-24 NOTE — Telephone Encounter (Signed)
noted 

## 2012-07-24 NOTE — Telephone Encounter (Signed)
Patient Information:  Caller Name: Lanyla  Phone: [phone]  Patient: [name], [name]  Gender: Female  DOB: [birth year]  Age: 79 Years  PCP: Lelon Perla.  Office Follow Up:  Does the office need to follow up with this patient?: No  Instructions For The Office: N/A  RN Note:  Caller is eating, drinking, and voiding. Decreased activity level. Caller states she feels weak. States her heart is beating faster than normal. Slight headache now per caller. Dizziness is relieved when sitting. Caller feels like she is going to pass out any time she is walking. Caller states she feels "short winded." Caller is not dizzy currently just "funny in the head."  Symptoms  Reason For Call & Symptoms: weak and dizzy.  Reviewed Health History In EMR: Yes  Reviewed Medications In EMR: Yes  Reviewed Allergies In EMR: Yes  Reviewed Surgeries / Procedures: Yes  Date of Onset of Symptoms: 07/17/2012  Guideline(s) Used:  Dizziness  Disposition Per Guideline:   Go to ED Now (or to Office with PCP Approval)  Reason For Disposition Reached:   Severe dizziness (e.g., unable to stand, requires support to walk, feels like passing out now)  Advice Given:  Temporary Dizziness  is usually a harmless symptom. It can be caused by not drinking enough water during sports or hot weather. It can also be caused by skipping a meal, too much sun exposure, standing up suddenly, standing too long in one place or even a viral illness.  Drink Fluids:  Drink several glasses of fruit juice, other clear fluids, or water. This will improve hydration and blood glucose. If you have a fever or have had heat exposure, make sure the fluids are cold.  Call Back If:  Still feel dizzy after 2 hours of rest and fluids  Passes out (faints)  You become worse.  Stand Up Slowly:  In the mornings, sit up for a few minutes before you stand up. That will help your blood flow make the adjustment.  If you have to stand up for long periods  of time, contract and relax your leg muscles to help pump the blood back to the heart.  Sit down or lie down if you feel dizzy.  Patient Refused Recommendation:  Patient Refused Appt, Patient Requests Appt At Later Date  Caller has appointment for 07/25/12 at 1430 and does not want to go to the Emergency Room- Caller will go for evaluation over night if she worsens.

## 2012-07-24 NOTE — Telephone Encounter (Signed)
If she feels like she is going to pass out I really wish she would get someone to take her to hospital.

## 2012-07-25 ENCOUNTER — Ambulatory Visit (INDEPENDENT_AMBULATORY_CARE_PROVIDER_SITE_OTHER): Payer: Medicare Other | Admitting: Family Medicine

## 2012-07-25 ENCOUNTER — Encounter: Payer: Self-pay | Admitting: Family Medicine

## 2012-07-25 VITALS — BP 122/74 | HR 94 | Temp 98.8°F | Wt 147.2 lb

## 2012-07-25 DIAGNOSIS — R5381 Other malaise: Secondary | ICD-10-CM

## 2012-07-25 DIAGNOSIS — N390 Urinary tract infection, site not specified: Secondary | ICD-10-CM

## 2012-07-25 DIAGNOSIS — E119 Type 2 diabetes mellitus without complications: Secondary | ICD-10-CM

## 2012-07-25 DIAGNOSIS — R5383 Other fatigue: Secondary | ICD-10-CM

## 2012-07-25 DIAGNOSIS — R000 Tachycardia, unspecified: Secondary | ICD-10-CM

## 2012-07-25 DIAGNOSIS — R0602 Shortness of breath: Secondary | ICD-10-CM

## 2012-07-25 LAB — POCT URINALYSIS DIPSTICK
Bilirubin, UA: NEGATIVE
Blood, UA: NEGATIVE
Ketones, UA: NEGATIVE
pH, UA: 5

## 2012-07-25 MED ORDER — CIPROFLOXACIN HCL 500 MG PO TABS: 500.0000 mg | ORAL_TABLET | Freq: Two times a day (BID) | ORAL | Status: AC

## 2012-07-25 NOTE — Patient Instructions (Addendum)

## 2012-07-25 NOTE — Progress Notes (Signed)
  Subjective:     [name] is a 79 y.o. female who presents for evaluation of fatigue. Symptoms began several days ago. The patient feels the fatigue began with: sob with exertion and palpitations occassionally. Symptoms of her fatigue have been general malaise and lack of interest in usual activities. Patient describes the following psychological symptoms: none. Patient denies constipation, fever, significant change in weight, unusual rashes and witnessed or suspected sleep apnea. Symptoms have been intermittent. Symptom severity: struggles to carry out day to day responsibilities.. Previous visits for this problem: none.   The following portions of the patient's history were reviewed and updated as appropriate: allergies, current medications, past family history, past medical history, past social history, past surgical history and problem list.  Review of Systems Pertinent items are noted in HPI.    Objective:    BP 122/74  Pulse 94  Temp(Src) 98.8 F (37.1 C) (Oral)  Wt 147 lb 3.2 oz (66.769 kg)  BMI 25.66 kg/m2  SpO2 95% General appearance: alert, cooperative, appears stated age and no distress Throat: lips, mucosa, and tongue normal; teeth and gums normal Neck: no adenopathy, supple, symmetrical, trachea midline and thyroid not enlarged, symmetric, no tenderness/mass/nodules Lungs: clear to auscultation bilaterally Heart: S1, S2 normal    Assessment:    fatigue, with sob and tachycardia    Plan:    Discussed diagnosis with patient. See orders for lab evaluation. if symptoms worsen or if CP develops go to ER

## 2012-07-26 ENCOUNTER — Ambulatory Visit
Admission: RE | Admit: 2012-07-26 | Discharge: 2012-07-26 | Disposition: A | Discharge status: Home / Self Care | Payer: Medicare Other | Source: Ambulatory Visit | Attending: Family Medicine | Admitting: Family Medicine

## 2012-07-26 ENCOUNTER — Telehealth: Payer: Self-pay | Admitting: Family Medicine

## 2012-07-26 ENCOUNTER — Ambulatory Visit (INDEPENDENT_AMBULATORY_CARE_PROVIDER_SITE_OTHER)
Admission: RE | Admit: 2012-07-26 | Discharge: 2012-07-26 | Disposition: A | Discharge status: Home / Self Care | Payer: Medicare Other | Source: Ambulatory Visit | Attending: Family Medicine | Admitting: Family Medicine

## 2012-07-26 ENCOUNTER — Telehealth: Payer: Self-pay

## 2012-07-26 DIAGNOSIS — R7989 Other specified abnormal findings of blood chemistry: Secondary | ICD-10-CM

## 2012-07-26 DIAGNOSIS — R791 Abnormal coagulation profile: Secondary | ICD-10-CM

## 2012-07-26 LAB — CBC WITH DIFFERENTIAL/PLATELET
Eosinophils Absolute: 0.2 10*3/uL (ref 0.0–0.7)
MCHC: 33.9 g/dL (ref 30.0–36.0)
MCV: 78.5 fl (ref 78.0–100.0)
Monocytes Absolute: 0.7 10*3/uL (ref 0.1–1.0)
Neutrophils Relative %: 60 % (ref 43.0–77.0)
Platelets: 252 10*3/uL (ref 150.0–400.0)
WBC: 10.3 10*3/uL (ref 4.5–10.5)

## 2012-07-26 LAB — BASIC METABOLIC PANEL
BUN: 26 mg/dL — ABNORMAL HIGH (ref 6–23)
Calcium: 8.6 mg/dL (ref 8.4–10.5)
Chloride: 103 mEq/L (ref 96–112)
Creatinine, Ser: 0.7 mg/dL (ref 0.4–1.2)

## 2012-07-26 LAB — HEPATIC FUNCTION PANEL: Total Bilirubin: 0.2 mg/dL — ABNORMAL LOW (ref 0.3–1.2)

## 2012-07-26 LAB — LIPID PANEL
Cholesterol: 240 mg/dL — ABNORMAL HIGH (ref 0–200)
HDL: 30.1 mg/dL — ABNORMAL LOW (ref >39.00)
Total CHOL/HDL Ratio: 8
Triglycerides: 1133 mg/dL — ABNORMAL HIGH (ref 0.0–149.0)
VLDL: 226.6 mg/dL — ABNORMAL HIGH (ref 0.0–40.0)

## 2012-07-26 LAB — VITAMIN B12: Vitamin B-12: 283 pg/mL (ref 211–911)

## 2012-07-26 LAB — HEMOGLOBIN A1C: Hgb A1c MFr Bld: 6.9 % — ABNORMAL HIGH (ref 4.6–6.5)

## 2012-07-26 MED ORDER — IOHEXOL 350 MG/ML SOLN
80.0000 mL | Freq: Once | INTRAVENOUS | Status: AC | PRN
  Administered 2012-07-26: 80 mL via INTRAVENOUS

## 2012-07-26 NOTE — Telephone Encounter (Signed)
Spoke with patient and she stated she does not feel well and she don't know what's wrong. Per Dr.Lowne schedule the CT.

## 2012-07-26 NOTE — Telephone Encounter (Signed)
See labs 

## 2012-07-26 NOTE — Telephone Encounter (Signed)
Message copied by Arnette Norris on Thu Jul 26, 2012 12:02 PM ------      Message from: Lelon Perla      Created: Wed Jul 25, 2012  8:16 PM       Elevated d dimer--- pt with no chest pain only doe.   Will get CT chest --PE protochol ------

## 2012-07-26 NOTE — Telephone Encounter (Signed)
[facility] Triage Call Report Triage Record Num: [id number] Operator: Albertine Grates Patient Name: [name] Call Date & Time: 07/25/2012 6:24:32PM Patient Phone: [phone] PCP: Lelon Perla Patient Gender: Female PCP Fax : [phone] Patient DOB: [birth year] Practice Name: [name] Reason for Call: Caller: [name]; PCP: Lelon Perla.; CB#: [phone]; [facility] calling and states patient had d dimer drawn stat 4-16 and is 0.67. Normal 0-0.48. Patient not notified. Protocol(s) Used: Office Note Recommended Outcome per Protocol: Information Noted and Sent to Office Reason for Outcome: Caller information to office Care Advice: ~

## 2012-07-28 LAB — URINE CULTURE: Colony Count: 4000

## 2012-07-31 ENCOUNTER — Ambulatory Visit (HOSPITAL_COMMUNITY): Payer: Medicare Other | Attending: Cardiovascular Disease | Admitting: Radiology

## 2012-07-31 DIAGNOSIS — R000 Tachycardia, unspecified: Secondary | ICD-10-CM | POA: Insufficient documentation

## 2012-07-31 DIAGNOSIS — I079 Rheumatic tricuspid valve disease, unspecified: Secondary | ICD-10-CM | POA: Insufficient documentation

## 2012-07-31 DIAGNOSIS — I739 Peripheral vascular disease, unspecified: Secondary | ICD-10-CM | POA: Insufficient documentation

## 2012-07-31 DIAGNOSIS — R0989 Other specified symptoms and signs involving the circulatory and respiratory systems: Secondary | ICD-10-CM

## 2012-07-31 DIAGNOSIS — R609 Edema, unspecified: Secondary | ICD-10-CM | POA: Insufficient documentation

## 2012-07-31 DIAGNOSIS — R0602 Shortness of breath: Secondary | ICD-10-CM

## 2012-07-31 DIAGNOSIS — R072 Precordial pain: Secondary | ICD-10-CM

## 2012-07-31 DIAGNOSIS — R5381 Other malaise: Secondary | ICD-10-CM | POA: Insufficient documentation

## 2012-07-31 DIAGNOSIS — R0609 Other forms of dyspnea: Secondary | ICD-10-CM | POA: Insufficient documentation

## 2012-07-31 DIAGNOSIS — I080 Rheumatic disorders of both mitral and aortic valves: Secondary | ICD-10-CM | POA: Insufficient documentation

## 2012-07-31 DIAGNOSIS — Z8249 Family history of ischemic heart disease and other diseases of the circulatory system: Secondary | ICD-10-CM | POA: Insufficient documentation

## 2012-07-31 NOTE — Progress Notes (Signed)
Echocardiogram performed.  

## 2012-08-03 ENCOUNTER — Telehealth: Payer: Self-pay | Admitting: *Deleted

## 2012-08-03 DIAGNOSIS — I5189 Other ill-defined heart diseases: Secondary | ICD-10-CM

## 2012-08-03 NOTE — Telephone Encounter (Signed)
Patient called the office to let us know that she changed her mind about a cardiology referral. Referral placed, advised the patient that she would be contacted by the office with the appt date and time. She verbalized understanding.

## 2012-08-07 ENCOUNTER — Encounter: Payer: Self-pay | Admitting: Cardiovascular Disease

## 2012-08-07 ENCOUNTER — Ambulatory Visit (INDEPENDENT_AMBULATORY_CARE_PROVIDER_SITE_OTHER): Payer: Medicare Other | Admitting: Cardiovascular Disease

## 2012-08-07 VITALS — BP 166/90 | HR 84 | Ht 64.0 in | Wt 149.4 lb

## 2012-08-07 DIAGNOSIS — R002 Palpitations: Secondary | ICD-10-CM

## 2012-08-07 DIAGNOSIS — R0602 Shortness of breath: Secondary | ICD-10-CM

## 2012-08-07 DIAGNOSIS — R079 Chest pain, unspecified: Secondary | ICD-10-CM

## 2012-08-07 NOTE — Patient Instructions (Addendum)
Your physician has requested that you have a lexiscan myoview. For further information please visit [email or web address] Please follow instruction sheet, as given.  Your physician recommends that you schedule a follow-up appointment in: 1 MONTH with Dr Excell Seltzer  Your physician recommends that you continue on your current medications as directed. Please refer to the Current Medication list given to you today.

## 2012-08-07 NOTE — Progress Notes (Signed)
HPI:  79 year-old presenting for initial cardiac evaluation. She has no documented history of cardiac disease. She has developed progressive symptoms of exercise intolerance. She describes chest pressure and shortness of breath with low-level activity over the past 2 months. Also admits to dizziness, weakness, and fatigue. She denies orthopnea, PND, or leg swelling. Her functional status has greatly declined. She denies resting chest pain or pressure. When she experiences chest pressure with exertion, it does not radiate.   To date she has undergone a CTA of the chest demonstrating calcified coronary arteries but no PE. She had an echo that showed no significant abnormalities. She is referred for further cardiovascular evaluation.  The patient has PAD and is followed by Dr Darrick Penna. She has been limited by calf claudication in the past but her dyspnea and fatigue are more limiting now. She's been managed medically for her PAD and has not undergone revascularization surgery or intervention.  Outpatient Encounter Prescriptions as of 08/07/2012  Medication Sig Dispense Refill  . aspirin 81 MG tablet Take 81 mg by mouth daily.        Marland Kitchen Dexlansoprazole (DEXILANT PO) Take by mouth daily.        . fenofibrate 160 MG tablet Take 1 tablet (160 mg total) by mouth daily.  30 tablet  2  . glucose blood (FREESTYLE LITE) test strip Check Blood sugar four times a day dx 250.00 Freestyle lite meter  100 each  11  . INS SYRINGE/NEEDLE .5CC/28G 28G X 1/2" 0.5 ML MISC Inject insulin as directed. Dx 250.00  100 each  12  . insulin aspart (NOVOLOG) 100 UNIT/ML injection 10 units SQ before meals tid  1 vial  12  . insulin detemir (LEVEMIR) 100 UNIT/ML injection 15 units sq qpm  3 mL  5  . NONFORMULARY OR COMPOUNDED ITEM Wheelchair----dx-- PAD, claudication with weakness  1 Device  0  . omeprazole (PRILOSEC) 10 MG capsule Take 10 mg by mouth daily.        . [DISCONTINUED] atorvastatin (LIPITOR) 10 MG tablet 1 po 3x a  week  30 tablet  3  . [DISCONTINUED] cholecalciferol (VITAMIN D) 1000 UNITS tablet Take 1,000 Units by mouth daily.        . [DISCONTINUED] Coenzyme Q10 200 MG capsule Take 1 capsule (200 mg total) by mouth daily.  30 capsule  11  . [DISCONTINUED] fish oil-omega-3 fatty acids 1000 MG capsule Take 2 g by mouth daily.        . [DISCONTINUED] FREESTYLE LITE test strip       . [DISCONTINUED] Glucose Blood (FREESTYLE LITE TEST VI)       . [DISCONTINUED] glucose blood test strip Daily.       No facility-administered encounter medications on file as of 08/07/2012.    Codeine; Metformin; Penicillins; and Pioglitazone  Past Medical History  Diagnosis Date  . GERD (gastroesophageal reflux disease)   . Hyperlipidemia   . Type II or unspecified type diabetes mellitus with neurological manifestations, not stated as uncontrolled(250.60)   . Corns and callosities   . Abdominal pain, epigastric   . Rash and other nonspecific skin eruption   . Diaphragmatic hernia without mention of obstruction or gangrene   . Hiatal hernia   . Internal hemorrhoids without mention of complication   . History of colon polyps 2009    Hyperplastic  . Hx of adenomatous colonic polyps 2009  . Diverticulosis of colon (without mention of hemorrhage) 2009  . IBS (irritable bowel syndrome)   .  Anxiety   . Status post dilation of esophageal narrowing   . Thyroid disease   . Diabetes mellitus     Past Surgical History  Procedure Laterality Date  . Thyroidectomy, partial    . Abdominal hysterectomy    . Bmd  2007  . Esophagogastroduodenoscopy  2007  . Laparoscopic nissen fundoplication  2009  . Cataract extraction    . Hernia repair      History   Social History  . Marital Status: Widowed    Spouse Name: N/A    Number of Children: 3  . Years of Education: N/A   Occupational History  . retired    Social History Main Topics  . Smoking status: Former Smoker    Quit date: 03/10/1981  . Smokeless tobacco:  Never Used  . Alcohol Use: No  . Drug Use: No  . Sexually Active: Not on file   Other Topics Concern  . Not on file   Social History Narrative   Widowed   3 children          Family History  Problem Relation Age of Onset  . Diabetes Maternal Grandfather   . Diabetes Son   . Hyperlipidemia Son   . Heart disease Paternal Grandmother   . Uterine cancer Mother   . Cancer Mother   . Breast cancer Paternal Aunt   . Colon cancer Neg Hx   . Stomach cancer Neg Hx     ROS: General: no fevers/chills/night sweats Eyes: no blurry vision, diplopia, or amaurosis ENT: no sore throat or hearing loss Resp: no cough, wheezing, or hemoptysis CV: no edema or palpitations GI: no abdominal pain, nausea, vomiting, diarrhea, or constipation GU: no dysuria, frequency, or hematuria Skin: no rash Neuro: no headache, numbness, tingling, or weakness of extremities Musculoskeletal: no joint pain or swelling Heme: no bleeding, DVT, or easy bruising Endo: no polydipsia or polyuria  BP 166/90  Pulse 84  Ht 5\' 4"  (1.626 m)  Wt 67.767 kg (149 lb 6.4 oz)  BMI 25.63 kg/m2  SpO2 97%  PHYSICAL EXAM: Pt is alert and oriented, WD, WN, pleasant elderly woman in no distress. HEENT: normal Neck: JVP normal. Carotid upstrokes normal without bruits. No thyromegaly. Lungs: equal expansion, clear bilaterally CV: Apex is discrete and nondisplaced, RRR without murmur or gallop Abd: soft, NT, +BS, no bruit, no hepatosplenomegaly Back: no CVA tenderness Ext: no C/C/E        Femoral pulses 2+=         DP/PT pulses nonpalpable Skin: warm and dry without rash Neuro: CNII-XII intact             Strength intact = bilaterally  EKG:  NSR with nonspecific IVCD, possible age-indeterminate inferior infarct.  CT angio Chest: Findings: No pulmonary embolus is identified. There is  cardiomegaly. Hiatal hernia is noted. No axillary, hilar or  mediastinal lymphadenopathy. No pleural or pericardial effusion.    Calcific coronary artery disease is seen. There is some dependent  atelectatic change in the lung bases. The lungs are otherwise  clear. Visualized upper abdomen is unremarkable. No focal bony  abnormality is identified.  IMPRESSION:  1. Negative for pulmonary embolus or acute disease.  2. Cardiomegaly.  3. Calcific coronary artery disease.  4. Hiatal hernia.    Echo 07/31/2012: Left ventricle: The cavity size was normal. Wall thickness was increased in a pattern of mild LVH. Systolic function was normal. The estimated ejection fraction was in the range of 55% to 60%. Wall  motion was normal; there were no regional wall motion abnormalities. Doppler parameters are consistent with abnormal left ventricular relaxation (grade 1 diastolic dysfunction).  ------------------------------------------------------------ Aortic valve: Trileaflet; mildly calcified leaflets. Valve mobility was mildly restricted. Doppler: There was mild stenosis. Trivial regurgitation. VTI ratio of LVOT to aortic valve: 0.39. Valve area: 1.1cm^2(VTI). Indexed valve area: 0.64cm^2/m^2 (VTI). Peak velocity ratio of LVOT to aortic valve: 0.44. Valve area: 1.24cm^2 (Vmax). Indexed valve area: 0.72cm^2/m^2 (Vmax). Mean gradient: 14mm Hg (S). Peak gradient: 23mm Hg (S).  ------------------------------------------------------------ Aorta: Aortic root: The aortic root was normal in size.  ------------------------------------------------------------ Mitral valve: Calcified annulus. Mobility was not restricted. Doppler: Transvalvular velocity was within the normal range. There was no evidence for stenosis. Trivial regurgitation. Peak gradient: 3mm Hg (D).  ------------------------------------------------------------ Left atrium: The atrium was normal in size.  ------------------------------------------------------------ Right ventricle: The cavity size was normal. Systolic function was  normal.  ------------------------------------------------------------ Pulmonic valve: Doppler: Transvalvular velocity was within the normal range. There was no evidence for stenosis.  ------------------------------------------------------------ Tricuspid valve: Structurally normal valve. Doppler: Transvalvular velocity was within the normal range. Trivial regurgitation.  ------------------------------------------------------------ Pulmonary artery: Systolic pressure was at the upper limits of normal.  ------------------------------------------------------------ Right atrium: The atrium was normal in size.  ------------------------------------------------------------ Pericardium: There was no pericardial effusion.  ASSESSMENT AND PLAN: 56. 79 year-old woman with exertional chest pressure, symptoms consistent with CCS Class 3 angina. She has multiple RF's for CAD including known PAD and diabetes. I have reviewed her CT chest and echo and she has coronary athersclerosis based on CT findings. Echo shows no wall motion abnormalities. Her symptoms are concerning for cardiac ischemia. I have recommended a Lexiscan Myoview stress test for further risk stratification. I would have a low threshold for cardiac catheterization if the Myoview is suggestive of ischemia. She should continue ASA and understands to call [phone] if resting chest pain or pressure occurs.  2. High blood pressure. Suspect white coat HTN. She reports home BP's of 120's/70's.  Will see back in close follow-up after the stress test is done. I have reviewed office notes from Dr Laury Axon, Dr Darrick Penna, and personally reviewed her imaging studies as above.   Tonny Bollman 08/08/2012 12:04 PM

## 2012-08-15 ENCOUNTER — Ambulatory Visit (HOSPITAL_COMMUNITY): Payer: Medicare Other | Attending: Cardiovascular Disease | Admitting: Radiology

## 2012-08-15 VITALS — BP 138/91 | Ht 64.0 in | Wt 146.0 lb

## 2012-08-15 DIAGNOSIS — R110 Nausea: Secondary | ICD-10-CM | POA: Insufficient documentation

## 2012-08-15 DIAGNOSIS — R0609 Other forms of dyspnea: Secondary | ICD-10-CM | POA: Insufficient documentation

## 2012-08-15 DIAGNOSIS — R0602 Shortness of breath: Secondary | ICD-10-CM

## 2012-08-15 DIAGNOSIS — E119 Type 2 diabetes mellitus without complications: Secondary | ICD-10-CM | POA: Insufficient documentation

## 2012-08-15 DIAGNOSIS — I739 Peripheral vascular disease, unspecified: Secondary | ICD-10-CM | POA: Insufficient documentation

## 2012-08-15 DIAGNOSIS — R5383 Other fatigue: Secondary | ICD-10-CM | POA: Insufficient documentation

## 2012-08-15 DIAGNOSIS — R079 Chest pain, unspecified: Secondary | ICD-10-CM | POA: Insufficient documentation

## 2012-08-15 DIAGNOSIS — R42 Dizziness and giddiness: Secondary | ICD-10-CM | POA: Insufficient documentation

## 2012-08-15 DIAGNOSIS — Z87891 Personal history of nicotine dependence: Secondary | ICD-10-CM | POA: Insufficient documentation

## 2012-08-15 DIAGNOSIS — R5381 Other malaise: Secondary | ICD-10-CM | POA: Insufficient documentation

## 2012-08-15 DIAGNOSIS — Z794 Long term (current) use of insulin: Secondary | ICD-10-CM | POA: Insufficient documentation

## 2012-08-15 DIAGNOSIS — R55 Syncope and collapse: Secondary | ICD-10-CM | POA: Insufficient documentation

## 2012-08-15 DIAGNOSIS — R0989 Other specified symptoms and signs involving the circulatory and respiratory systems: Secondary | ICD-10-CM | POA: Insufficient documentation

## 2012-08-15 DIAGNOSIS — R002 Palpitations: Secondary | ICD-10-CM | POA: Insufficient documentation

## 2012-08-15 MED ORDER — TECHNETIUM TC 99M SESTAMIBI GENERIC - CARDIOLITE
10.0000 | Freq: Once | INTRAVENOUS | Status: AC | PRN
  Administered 2012-08-15: 10 via INTRAVENOUS

## 2012-08-15 MED ORDER — TECHNETIUM TC 99M SESTAMIBI GENERIC - CARDIOLITE
30.0000 | Freq: Once | INTRAVENOUS | Status: AC | PRN
  Administered 2012-08-15: 30 via INTRAVENOUS

## 2012-08-15 MED ORDER — REGADENOSON 0.4 MG/5ML IV SOLN
0.4000 mg | Freq: Once | INTRAVENOUS | Status: AC
  Administered 2012-08-15: 0.4 mg via INTRAVENOUS

## 2012-08-15 NOTE — Progress Notes (Signed)
[facility] SITE 3 NUCLEAR MED [address] [place], [place] [address] [phone]    Cardiology Nuclear Med Study  [name] is a 79 y.o. female     MRN : [id number]     DOB: Aug 12, 1929  Procedure Date: 08/15/2012  Nuclear Med Background Indication for Stress Test:  Evaluation for Ischemia History:  No prior known history of CAD, and 07-31-12 Echo: EF=55-60%, mild AS, 07-2012 CT: Calcified Coronaries Cardiac Risk Factors: Claudication, History of Smoking, IDDM Type 2, PAD, and Lipids  Symptoms: Chest Pain/Chest Pressure with/without exertion (last occurrence this am),  Dizziness, DOE, Fatigue, Fatigue with Exertion, Light-Headedness, Nausea, Near Syncope, Palpitations, Rapid HR and SOB   Nuclear Pre-Procedure Caffeine/Decaff Intake:  None NPO After: 5 pm   Lungs:  clear O2 Sat: 96-97% on room air. IV 0.9% NS with Angio Cath:  22g  IV Site: R Antecubital  IV Started by:  Bonnita Levan, RN  Chest Size (in):  38 Cup Size: C  Height: 5\' 4"  (1.626 m)  Weight:  146 lb (66.225 kg)  BMI:  Body mass index is 25.05 kg/(m^2). Tech Comments:  BS @ 8 AM = 157    Nuclear Med Study 1 or 2 day study: 1 day  Stress Test Type:  Lexiscan  Reading MD: Charlton Haws, MD  Order Authorizing Provider:  Tonny Bollman, MD  Resting Radionuclide: Technetium 65m Sestamibi  Resting Radionuclide Dose: 11.0 mCi   Stress Radionuclide:  Technetium 56m Sestamibi  Stress Radionuclide Dose: 33.0 mCi           Stress Protocol Rest HR: 65 Stress HR: 94  Rest BP: 138/91 Stress BP: 155/91  Exercise Time (min): n/a METS: n/a   Predicted Max HR: 140 bpm % Max HR: 67.14 bpm Rate Pressure Product: 46962   Dose of Adenosine (mg):  n/a Dose of Lexiscan: 0.4 mg  Dose of Atropine (mg): n/a Dose of Dobutamine: n/a mcg/kg/min (at max HR)  Stress Test Technologist: Irean Hong, RN  Nuclear Technologist:  Domenic Polite, CNMT     Rest Procedure:  Myocardial perfusion imaging was performed at  rest 45 minutes following the intravenous administration of Technetium 26m Sestamibi. Rest ECG: NSR Q waves in inferolateral leads  Stress Procedure:  The patient received IV Lexiscan 0.4 mg over 15-seconds.  Technetium 44m Sestamibi injected at 30-seconds. The patient complained of chest tightness with Lexiscan. Quantitative spect images were obtained after a 45 minute delay. Stress ECG: No significant change from baseline ECG  QPS Raw Data Images:  Normal; no motion artifact; normal heart/lung ratio. Stress Images:  Normal homogeneous uptake in all areas of the myocardium. Rest Images:  Normal homogeneous uptake in all areas of the myocardium. Subtraction (SDS):  SDS 4  Transient Ischemic Dilatation (Normal <1.22):  1.13 Lung/Heart Ratio (Normal <0.45):  0.33  Quantitative Gated Spect Images QGS EDV:  67 ml QGS ESV:  26 ml  Impression Exercise Capacity:  Lexiscan with no exercise. BP Response:  Normal blood pressure response. Clinical Symptoms:  Chills, Dyspnea and chest tightness ECG Impression:  No significant ST segment change suggestive of ischemia. Comparison with Prior Nuclear Study: No previous nuclear study performed  Overall Impression:  Low risk stress nuclear study thinning of the inferior base not thought to be significant.  LV Ejection Fraction: 62%.  LV Wall Motion:  NL LV Function; NL Wall Motion  Charlton Haws

## 2012-08-20 ENCOUNTER — Ambulatory Visit: Payer: Medicare Other | Admitting: Cardiovascular Disease

## 2012-09-12 ENCOUNTER — Encounter: Payer: Self-pay | Admitting: Cardiovascular Disease

## 2012-09-12 ENCOUNTER — Ambulatory Visit (INDEPENDENT_AMBULATORY_CARE_PROVIDER_SITE_OTHER): Payer: Medicare Other | Admitting: Cardiovascular Disease

## 2012-09-12 VITALS — BP 141/92 | HR 68 | Ht 64.0 in | Wt 148.0 lb

## 2012-09-12 DIAGNOSIS — R42 Dizziness and giddiness: Secondary | ICD-10-CM

## 2012-09-12 NOTE — Patient Instructions (Signed)
Your physician recommends that you schedule a follow-up appointment as needed.   Your physician recommends that you continue on your current medications as directed. Please refer to the Current Medication list given to you today.  

## 2012-09-12 NOTE — Progress Notes (Signed)
HPI:  79 year old woman presenting for followup evaluation. She was seen April 29 with a multitude of complaints. She described chest pressure, shortness of breath, dizziness, and weakness with any degree of exertion. Her symptoms were very concerning. She had no documented history of cardiac disease. An echocardiogram and nuclear stress test were done, both of which were unrevealing. She returns today for followup evaluation.  The patient feels remarkably better. She had been taking fenofibrate and stop this. Within one week, all of her symptoms were gone. She feels much better and states that she now feels like her "79 year old self." She denies further chest discomfort, dizziness, or weakness. She's had intolerances to several medications over the years and tells me that she would rather die naturally than be on more preventative medications.  Outpatient Encounter Prescriptions as of 09/12/2012  Medication Sig Dispense Refill  . aspirin 81 MG tablet Take 81 mg by mouth daily.        Marland Kitchen Dexlansoprazole (DEXILANT PO) Take by mouth daily.        Marland Kitchen glucose blood (FREESTYLE LITE) test strip Check Blood sugar four times a day dx 250.00 Freestyle lite meter  100 each  11  . INS SYRINGE/NEEDLE .5CC/28G 28G X 1/2" 0.5 ML MISC Inject insulin as directed. Dx 250.00  100 each  12  . insulin aspart (NOVOLOG) 100 UNIT/ML injection 10 units SQ before meals tid  1 vial  12  . insulin detemir (LEVEMIR) 100 UNIT/ML injection 15 units sq qpm  3 mL  5  . NONFORMULARY OR COMPOUNDED ITEM Wheelchair----dx-- PAD, claudication with weakness  1 Device  0  . omeprazole (PRILOSEC) 10 MG capsule Take 10 mg by mouth daily.        . [DISCONTINUED] fenofibrate 160 MG tablet Take 1 tablet (160 mg total) by mouth daily.  30 tablet  2   No facility-administered encounter medications on file as of 09/12/2012.    Allergies  Allergen Reactions  . Codeine     Rapid heart rate  . Metformin     unspecified  . Penicillins    unspecified  . Pioglitazone     REACTION: rash    Past Medical History  Diagnosis Date  . GERD (gastroesophageal reflux disease)   . Hyperlipidemia   . Type II or unspecified type diabetes mellitus with neurological manifestations, not stated as uncontrolled(250.60)   . Corns and callosities   . Abdominal pain, epigastric   . Rash and other nonspecific skin eruption   . Diaphragmatic hernia without mention of obstruction or gangrene   . Hiatal hernia   . Internal hemorrhoids without mention of complication   . History of colon polyps 2009    Hyperplastic  . Hx of adenomatous colonic polyps 2009  . Diverticulosis of colon (without mention of hemorrhage) 2009  . IBS (irritable bowel syndrome)   . Anxiety   . Status post dilation of esophageal narrowing   . Thyroid disease   . Diabetes mellitus     ROS: Negative except as per HPI  BP 141/92  Pulse 68  Ht 5\' 4"  (1.626 m)  Wt 67.132 kg (148 lb)  BMI 25.39 kg/m2  PHYSICAL EXAM: Pt is alert and oriented, pleasant elderly lady in NAD HEENT: normal Neck: JVP - normal, carotids 2+= with bilateral bruits Lungs: CTA bilaterally CV: RRR without murmur or gallop Abd: soft, NT, Positive BS, no hepatomegaly Ext: no C/C/E, distal pulses intact and equal Skin: warm/dry no rash  Lexiscan Myoview: QPS  Raw Data Images: Normal; no motion artifact; normal heart/lung ratio.  Stress Images: Normal homogeneous uptake in all areas of the myocardium.  Rest Images: Normal homogeneous uptake in all areas of the myocardium.  Subtraction (SDS): SDS 4  Transient Ischemic Dilatation (Normal <1.22): 1.13  Lung/Heart Ratio (Normal <0.45): 0.33  Quantitative Gated Spect Images  QGS EDV: 67 ml  QGS ESV: 26 ml  Impression  Exercise Capacity: Lexiscan with no exercise.  BP Response: Normal blood pressure response.  Clinical Symptoms: Chills, Dyspnea and chest tightness  ECG Impression: No significant ST segment change suggestive of ischemia.    Comparison with Prior Nuclear Study: No previous nuclear study performed  Overall Impression: Low risk stress nuclear study thinning of the inferior base not thought to be significant.  LV Ejection Fraction: 62%. LV Wall Motion: NL LV Function; NL Wall Motion  ASSESSMENT AND PLAN: 1. Chest pain with multiple associated symptoms. All seem to be resolved after discontinuation of fenofibrate. Myoview scan showed no ischemia. The patient will followup as needed.  2. Elevated blood pressure. She reports white coat hypertension. States blood pressure is normal when she is not in the doctor's office.  For followup I will see her back as needed. She has peripheral arterial disease and is followed by Dr. Darrick Penna. She should remain on aspirin for antiplatelet therapy. She is not interested in pursuing any other risk reduction measures.  Tonny Bollman 09/12/2012 4:53 PM

## 2012-09-13 ENCOUNTER — Encounter: Payer: Self-pay | Admitting: Cardiovascular Disease

## 2012-09-20 ENCOUNTER — Ambulatory Visit: Payer: Medicare Other | Admitting: Neurosurgery

## 2012-09-21 ENCOUNTER — Telehealth: Payer: Self-pay | Admitting: Family Medicine

## 2012-09-21 NOTE — Telephone Encounter (Signed)
noted 

## 2012-09-21 NOTE — Telephone Encounter (Signed)
In reference to Nephrology referral entered 05/02/12, per [facility], they have made multiple attempts to schedule patient, have spoke with patient on several occasions, without success, as have I.  They are now discarding this patient's referral.

## 2012-10-17 ENCOUNTER — Encounter: Payer: Self-pay | Admitting: Vascular Surgery

## 2012-10-18 ENCOUNTER — Encounter: Payer: Self-pay | Admitting: Vascular Surgery

## 2012-10-18 ENCOUNTER — Other Ambulatory Visit: Payer: Self-pay | Admitting: *Deleted

## 2012-10-18 ENCOUNTER — Ambulatory Visit (INDEPENDENT_AMBULATORY_CARE_PROVIDER_SITE_OTHER): Payer: Medicare Other | Admitting: Vascular Surgery

## 2012-10-18 ENCOUNTER — Encounter (INDEPENDENT_AMBULATORY_CARE_PROVIDER_SITE_OTHER): Payer: Medicare Other

## 2012-10-18 VITALS — BP 199/79 | HR 65 | Ht 64.0 in | Wt 150.5 lb

## 2012-10-18 DIAGNOSIS — I739 Peripheral vascular disease, unspecified: Secondary | ICD-10-CM

## 2012-10-18 DIAGNOSIS — I70219 Atherosclerosis of native arteries of extremities with intermittent claudication, unspecified extremity: Secondary | ICD-10-CM

## 2012-10-18 NOTE — Progress Notes (Signed)
[facility] HISTORY AND PHYSICAL   History of Present Illness:  Patient is a 79 y.o. year old female who presents for follow-up evaluation for PAD.  She is on Aspirin for antiplatelet therapy. Her atherosclerotic risk factors remain diabetes and elevated cholesterol.  These are all currently stable and followed by the primary care physician.  The patient currently has claudication symptoms that occur after walking one to 2 blocks. The patient states that her symptoms may be slightly worse over the last 2 years. However, she become short of breath usually before her legs get out.  The patient denies rest pain or ulcers on the feet. She does have a history of peripheral neuropathy. Chronic medical problems include hyperlipidemia and diabetes both of which are currently stable. She is on aspirin.     Past Medical History  Diagnosis Date  . GERD (gastroesophageal reflux disease)   . Hyperlipidemia   . Type II or unspecified type diabetes mellitus with neurological manifestations, not stated as uncontrolled(250.60)   . Corns and callosities   . Abdominal pain, epigastric   . Rash and other nonspecific skin eruption   . Diaphragmatic hernia without mention of obstruction or gangrene   . Hiatal hernia   . Internal hemorrhoids without mention of complication   . History of colon polyps 2009    Hyperplastic  . Hx of adenomatous colonic polyps 2009  . Diverticulosis of colon (without mention of hemorrhage) 2009  . IBS (irritable bowel syndrome)   . Anxiety   . Status post dilation of esophageal narrowing   . Thyroid disease   . Diabetes mellitus     Past Surgical History  Procedure Laterality Date  . Thyroidectomy, partial    . Abdominal hysterectomy    . Bmd  2007  . Esophagogastroduodenoscopy  2007  . Laparoscopic nissen fundoplication  2009  . Cataract extraction    . Hernia repair      Review of Systems:  Neurologic: sensation in the feet is  intact Cardiac:denies chest pain Pulmonary: denies cough or wheeze  Social History History   Substance Use Topics   .  Smoking status:  Former Smoker       Quit date:  03/10/1981   .  Smokeless tobacco:  Never Used   .  Alcohol Use:  No     Allergies    Allergies   Allergen  Reactions   .  Codeine         Rapid heart rate   .  Metformin         unspecified   .  Penicillins         unspecified   .  Pioglitazone         REACTION: rash       Current Outpatient Prescriptions on File Prior to Visit  Medication Sig Dispense Refill  . aspirin 81 MG tablet Take 81 mg by mouth daily.        Marland Kitchen Dexlansoprazole (DEXILANT PO) Take by mouth daily.        Marland Kitchen glucose blood (FREESTYLE LITE) test strip Check Blood sugar four times a day dx 250.00 Freestyle lite meter  100 each  11  . INS SYRINGE/NEEDLE .5CC/28G 28G X 1/2" 0.5 ML MISC Inject insulin as directed. Dx 250.00  100 each  12  . insulin aspart (NOVOLOG) 100 UNIT/ML injection 10 units SQ before meals tid  1 vial  12  . insulin detemir (LEVEMIR) 100 UNIT/ML injection  15 units sq qpm  3 mL  5  . NONFORMULARY OR COMPOUNDED ITEM Wheelchair----dx-- PAD, claudication with weakness  1 Device  0  . omeprazole (PRILOSEC) 10 MG capsule Take 10 mg by mouth daily.         No current facility-administered medications on file prior to visit.      Physical Examination    Filed Vitals:     03/17/11 1439   BP:  170/91   Pulse:  70   Resp:  16   Height:  5\' 4"  (1.626 m)   Weight:  139 lb (63.05 kg)   SpO2:  97%     Body mass index is 23.86 kg/(m^2).  General:  Alert and oriented, no acute distress HEENT: Normal Neck: No bruit or JVD Pulmonary: Clear to auscultation bilaterally Cardiac: Regular Rate and Rhythm without murmur Neurologic: Upper and lower extremity motor 5/5 and symmetric Extremities:  2+ femoral absent popliteal and pedal pulses Skin: no ulcer or rash  DATA: Bilateral ABIs were performed today. Right side was 0.57  left 0.60 toe pressures greater than 100 bilaterally. I reviewed and interpreted this study. These are fairly consistent with her numbers dating back all the way to March of 2010.   ASSESSMENT: Bilateral lower extremity PAD with stable symptoms at this point. I believe continued conservative management would be best for her. She has not really disabled much by her symptoms at this point. She is not interested in any intervention at this point.  PLAN: She will followup in 6 months time with repeat ABIs and toe pressures. If her symptoms become worse over time and more debilitating or if she develops rest pain or tissue loss we would consider arteriogram for possible revascularization. However, I believe that currently her shortness of breath limits her as much as if not more than her PAD.  Fabienne Bruns, MD [facility] Office: [phone] Pager: [phone]

## 2012-12-19 ENCOUNTER — Other Ambulatory Visit: Payer: Self-pay | Admitting: Family Medicine

## 2013-02-02 ENCOUNTER — Ambulatory Visit (INDEPENDENT_AMBULATORY_CARE_PROVIDER_SITE_OTHER): Payer: Medicare Other | Admitting: Family Medicine

## 2013-02-02 ENCOUNTER — Ambulatory Visit: Payer: Medicare Other

## 2013-02-02 VITALS — BP 122/70 | HR 88 | Temp 98.3°F | Resp 18 | Ht 63.25 in | Wt 148.0 lb

## 2013-02-02 DIAGNOSIS — M25552 Pain in left hip: Secondary | ICD-10-CM

## 2013-02-02 DIAGNOSIS — M25512 Pain in left shoulder: Secondary | ICD-10-CM

## 2013-02-02 DIAGNOSIS — M79609 Pain in unspecified limb: Secondary | ICD-10-CM

## 2013-02-02 DIAGNOSIS — M79642 Pain in left hand: Secondary | ICD-10-CM

## 2013-02-02 DIAGNOSIS — M79632 Pain in left forearm: Secondary | ICD-10-CM

## 2013-02-02 DIAGNOSIS — W19XXXA Unspecified fall, initial encounter: Secondary | ICD-10-CM

## 2013-02-02 DIAGNOSIS — M899 Disorder of bone, unspecified: Secondary | ICD-10-CM

## 2013-02-02 DIAGNOSIS — M25559 Pain in unspecified hip: Secondary | ICD-10-CM

## 2013-02-02 DIAGNOSIS — T1490XA Injury, unspecified, initial encounter: Secondary | ICD-10-CM

## 2013-02-02 DIAGNOSIS — S42292A Other displaced fracture of upper end of left humerus, initial encounter for closed fracture: Secondary | ICD-10-CM

## 2013-02-02 DIAGNOSIS — S42293A Other displaced fracture of upper end of unspecified humerus, initial encounter for closed fracture: Secondary | ICD-10-CM

## 2013-02-02 NOTE — Progress Notes (Signed)
[facility]:  Office Visit  Chief Complaint:  Chief Complaint  Patient presents with  . Arm Pain    Left, X 3 weeks, Fall  . Hip Pain    Left, X 3 weeks, Fall    HPI: [name] is a 79 y.o. female who is here for left shoulder, arm and wrist pain s/p fall  3 weeks ago at son's home when she missed the last step of a set of stairs. No treatment to date except Aleve.  Pt states she fell onto the Left side, and landed on her LUE and LLE.  She broke the fall with her shoulder and wrist. Unable to get up by herself.  Pt ambulates with a cane prior to fall and continues to use the cane.  Since fall has been having to drag the left foot when ambulating.   Bruises and swelling LUE.   Left proximal humeral pain.  Left elbow soreness.  Pain in Left hip with sitting and especially in the left pubic groin area when attempting to raise the left leg up or when walking.  8/10 sharp pain when she moves. She does not want any narcotics, has been taking Aleve which helps some of the pain She denies steroid use She does have a history of DM and also lower extremity PAD  Denies osteoporosis or osteopenia  Past Medical History  Diagnosis Date  . GERD (gastroesophageal reflux disease)   . Hyperlipidemia   . Type II or unspecified type diabetes mellitus with neurological manifestations, not stated as uncontrolled(250.60)   . Corns and callosities   . Abdominal pain, epigastric   . Rash and other nonspecific skin eruption   . Diaphragmatic hernia without mention of obstruction or gangrene   . Hiatal hernia   . Internal hemorrhoids without mention of complication   . History of colon polyps 2009    Hyperplastic  . Hx of adenomatous colonic polyps 2009  . Diverticulosis of colon (without mention of hemorrhage) 2009  . IBS (irritable bowel syndrome)   . Anxiety   . Status post dilation of esophageal narrowing   . Thyroid disease   . Diabetes mellitus    Past Surgical History   Procedure Laterality Date  . Thyroidectomy, partial    . Abdominal hysterectomy    . Bmd  2007  . Esophagogastroduodenoscopy  2007  . Laparoscopic nissen fundoplication  2009  . Cataract extraction    . Hernia repair     History   Social History  . Marital Status: Widowed    Spouse Name: N/A    Number of Children: 3  . Years of Education: N/A   Occupational History  . retired    Social History Main Topics  . Smoking status: Former Smoker    Quit date: 03/10/1981  . Smokeless tobacco: Never Used  . Alcohol Use: No  . Drug Use: No  . Sexual Activity: None   Other Topics Concern  . None   Social History Narrative   Widowed   3 children         Family History  Problem Relation Age of Onset  . Diabetes Maternal Grandfather   . Diabetes Son   . Hyperlipidemia Son   . Heart disease Paternal Grandmother   . Uterine cancer Mother   . Cancer Mother   . Breast cancer Paternal Aunt   . Colon cancer Neg Hx   . Stomach cancer Neg Hx    Allergies  Allergen Reactions  . Codeine     Rapid heart rate  . Metformin     unspecified  . Penicillins     unspecified  . Pioglitazone     REACTION: rash   Prior to Admission medications   Medication Sig Start Date End Date Taking? Authorizing Provider  aspirin 81 MG tablet Take 81 mg by mouth daily.     Yes Historical Provider, MD  FREESTYLE LITE test strip USE ONE STRIP TO TEST BLOOD GLUCOSE FOUR TIMES A DAY 12/19/12  Yes Yvonne R Lowne, DO  INS SYRINGE/NEEDLE .5CC/28G 28G X 1/2" 0.5 ML MISC Inject insulin as directed. Dx 250.00 12/07/11  Yes Lelon Perla, DO  insulin aspart (NOVOLOG) 100 UNIT/ML injection 10 units SQ before meals tid 12/21/11  Yes Grayling Congress Lowne, DO  insulin detemir (LEVEMIR) 100 UNIT/ML injection 15 units sq qpm 02/28/12  Yes Yvonne R Lowne, DO  Dexlansoprazole (DEXILANT PO) Take by mouth daily.      Historical Provider, MD  NONFORMULARY OR COMPOUNDED ITEM Wheelchair----dx-- PAD, claudication with weakness  12/06/11   Lelon Perla, DO  omeprazole (PRILOSEC) 10 MG capsule Take 10 mg by mouth daily.      Historical Provider, MD     ROS: The patient denies fevers, chills, night sweats, unintentional weight loss, chest pain, palpitations, wheezing, dyspnea on exertion, nausea, vomiting, abdominal pain, dysuria, hematuria, melena  All other systems have been reviewed and were otherwise negative with the exception of those mentioned in the HPI and as above.    PHYSICAL EXAM: Filed Vitals:   02/02/13 1123  BP: 122/70  Pulse: 88  Temp: 98.3 F (36.8 C)  Resp: 18   Filed Vitals:   02/02/13 1123  Height: 5' 3.25" (1.607 m)  Weight: 148 lb (67.132 kg)   Body mass index is 26 kg/(m^2).  General: Alert, no acute distress HEENT:  Normocephalic, atraumatic, oropharynx patent. EOMI, PERRLA Cardiovascular:  Regular rate and rhythm, no rubs murmurs or gallops.  No Carotid bruits, radial pulse intact. No pedal edema.  Respiratory: Clear to auscultation bilaterally.  No wheezes, rales, or rhonchi.  No cyanosis, no use of accessory musculature GI: No organomegaly, abdomen is soft and non-tender, positive bowel sounds.  No masses. Skin: No rashes. Neurologic: Facial musculature symmetric. Psychiatric: Patient is appropriate throughout our interaction. Lymphatic: No cervical lymphadenopathy Musculoskeletal: In wheel chair Left shoulder-+ ecchymosis, extreme pain with ROM, unable to move shoulder, able to move wrist and fingers full ROM Tender along humerus Nontender at elbow Nontender wrist + radial pulse, edema of left hand, 5/5 strength,sensation intact Nontender left hip, she is unable to pass 60 deg in flexion and has pain with internal and external rotation of hip and cannot do it very well.   LABS: Results for orders placed in visit on 07/25/12  URINE CULTURE      Result Value Range   Colony Count 4,000 COLONIES/ML     Organism ID, Bacteria Insignificant Growth    BASIC METABOLIC PANEL       Result Value Range   Sodium 134 (*) 135 - 145 mEq/L   Potassium 4.4  3.5 - 5.1 mEq/L   Chloride 103  96 - 112 mEq/L   CO2 22  19 - 32 mEq/L   Glucose, Bld 203 (*) 70 - 99 mg/dL   BUN 26 (*) 6 - 23 mg/dL   Creatinine, Ser 0.7  0.4 - 1.2 mg/dL   Calcium 8.6  8.4 - 40.9 mg/dL  GFR 81.44  >60.00 mL/min  CBC WITH DIFFERENTIAL      Result Value Range   WBC 10.3  4.5 - 10.5 K/uL   RBC 4.95  3.87 - 5.11 Mil/uL   Hemoglobin 13.2  12.0 - 15.0 g/dL   HCT 81.1  91.4 - 78.2 %   MCV 78.5  78.0 - 100.0 fl   MCHC 33.9  30.0 - 36.0 g/dL   RDW 95.6 (*) 21.3 - 08.6 %   Platelets 252.0  150.0 - 400.0 K/uL   Neutrophils Relative % 60.0  43.0 - 77.0 %   Lymphocytes Relative 30.3  12.0 - 46.0 %   Monocytes Relative 7.2  3.0 - 12.0 %   Eosinophils Relative 2.2  0.0 - 5.0 %   Basophils Relative 0.3  0.0 - 3.0 %   Neutro Abs 6.2  1.4 - 7.7 K/uL   Lymphs Abs 3.1  0.7 - 4.0 K/uL   Monocytes Absolute 0.7  0.1 - 1.0 K/uL   Eosinophils Absolute 0.2  0.0 - 0.7 K/uL   Basophils Absolute 0.0  0.0 - 0.1 K/uL  HEMOGLOBIN A1C      Result Value Range   Hemoglobin A1C 6.9 (*) 4.6 - 6.5 %  HEPATIC FUNCTION PANEL      Result Value Range   Total Bilirubin 0.2 (*) 0.3 - 1.2 mg/dL   Bilirubin, Direct 0.0  0.0 - 0.3 mg/dL   Alkaline Phosphatase 128 (*) 39 - 117 U/L   AST 19  0 - 37 U/L   ALT 16  0 - 35 U/L   Total Protein 7.2  6.0 - 8.3 g/dL   Albumin 3.6  3.5 - 5.2 g/dL  LIPID PANEL      Result Value Range   Cholesterol 240 (*) 0 - 200 mg/dL   Triglycerides 5784.6 Repeated and verified X2. (*) 0.0 - 149.0 mg/dL   HDL 96.29 Repeated and verified X2. (*) >39.00 mg/dL   VLDL 528.4 (*) 0.0 - 40.0 mg/dL   Total CHOL/HDL Ratio 8    MICROALBUMIN / CREATININE URINE RATIO      Result Value Range   Microalb, Ur 28.7 (*) 0.0 - 1.9 mg/dL   Creatinine,U 13.2     Microalb Creat Ratio 49.1 (*) 0.0 - 30.0 mg/g  D-DIMER, QUANTITATIVE      Result Value Range   D-Dimer, Quant 0.67 (*) 0.00 - 0.48 ug/mL-FEU  VITAMIN  B12      Result Value Range   Vitamin B-12 283  211 - 911 pg/mL  LDL CHOLESTEROL, DIRECT      Result Value Range   Direct LDL 103.1    POCT URINALYSIS DIPSTICK      Result Value Range   Color, UA yellow     Clarity, UA clear     Glucose, UA neg     Bilirubin, UA neg     Ketones, UA neg     Spec Grav, UA <=1.005     Blood, UA neg     pH, UA 5.0     Protein, UA 30     Urobilinogen, UA 0.2     Nitrite, UA neg     Leukocytes, UA large (3+)       EKG/XRAY:   Primary read interpreted by Dr. Conley Rolls at [facility]. Nondisplaced left humeral greater tuberosity and neck fx   ? Old left ramus fx No wrist fracture, no forearm fx, no hip fracture + PAD/arterial calcification   ASSESSMENT/PLAN: Encounter Diagnoses  Name Primary?  . Pain in humerus, left   . Pain of left hand   . Pain in left forearm   . Pain in left hip   . Fall with injury   . Fall with injury   . Humeral head fracture, left, closed, initial encounter Yes   Greater tuberosity and humeral neck fx Left inferior Ramus fx Patient was put in sling, she was advise to take tylenol and aleve prn for pain She declined any stronger or narcotic pain meds She will be referred to ortho for further management Appt is 02/06/13 @ 2:15 Delbert Harness with Dr Dyann Ruddle sideeffects, risk and benefits, and alternatives of medications d/w patient. Patient is aware that all medications have potential sideeffects and we are unable to predict every sideeffect or drug-drug interaction that may occur.  LE, THAO PHUONG, DO 02/02/2013 1:48 PM   Results discussed with patient and her sone Lonnie Official xray readings: Mildly displaced proximal left humeral neck fracture. No acute fracture or subluxation. Question of fracture of left  inferior pubic ramus.

## 2013-02-18 ENCOUNTER — Other Ambulatory Visit: Payer: Self-pay | Admitting: Family Medicine

## 2013-02-19 ENCOUNTER — Telehealth: Payer: Self-pay | Admitting: Family Medicine

## 2013-02-19 NOTE — Telephone Encounter (Signed)
Patient is calling to request a refill on her rx insulin detemir (LEVEMIR) 100 UNIT/ML injection . She states that she fell in October and broke a few bones so she cannot get around good enough yet to come in for an appointment. Please advise.

## 2013-02-20 NOTE — Telephone Encounter (Signed)
Is it okay to give more refills. If so how many? Please advise

## 2013-02-20 NOTE — Telephone Encounter (Signed)
Triglycerides > 1000 in 4/14 & A1c 6.9%.She needs to have fasting lipids, liver panel, A1c and urine microalbumin as soon as possible. The prescription can be picked at the time of the labs.

## 2013-02-21 NOTE — Telephone Encounter (Signed)
RF x 1 month, labs and OV ASAP

## 2013-02-21 NOTE — Telephone Encounter (Signed)
Spoke with patient and she stated she cannot drive and cannot come in for a lab visit. She would like to know if the Levemir can be refilled?

## 2013-02-22 ENCOUNTER — Other Ambulatory Visit: Payer: Self-pay | Admitting: *Deleted

## 2013-02-22 DIAGNOSIS — E1165 Type 2 diabetes mellitus with hyperglycemia: Secondary | ICD-10-CM

## 2013-02-22 MED ORDER — INSULIN DETEMIR 100 UNIT/ML ~~LOC~~ SOLN: SUBCUTANEOUS | Status: DC

## 2013-02-22 NOTE — Telephone Encounter (Signed)
Levemir flexpen reordered for one month. Pt is aware that she is to come in for labs. She states that she broke her arm and is not able to drive at this moment.

## 2013-02-22 NOTE — Telephone Encounter (Signed)
Refilled for one month. Pt is aware that labs are due. She states that she broke her arm and is unable to drive at this time.

## 2013-05-08 ENCOUNTER — Encounter: Payer: Self-pay | Admitting: Family Medicine

## 2013-05-08 ENCOUNTER — Ambulatory Visit (INDEPENDENT_AMBULATORY_CARE_PROVIDER_SITE_OTHER): Payer: Medicare Other | Admitting: Family Medicine

## 2013-05-08 VITALS — BP 132/74 | HR 76 | Temp 98.5°F | Wt 147.8 lb

## 2013-05-08 DIAGNOSIS — IMO0002 Reserved for concepts with insufficient information to code with codable children: Secondary | ICD-10-CM

## 2013-05-08 DIAGNOSIS — N189 Chronic kidney disease, unspecified: Secondary | ICD-10-CM

## 2013-05-08 DIAGNOSIS — E119 Type 2 diabetes mellitus without complications: Secondary | ICD-10-CM

## 2013-05-08 DIAGNOSIS — E1122 Type 2 diabetes mellitus with diabetic chronic kidney disease: Secondary | ICD-10-CM

## 2013-05-08 DIAGNOSIS — E1129 Type 2 diabetes mellitus with other diabetic kidney complication: Secondary | ICD-10-CM

## 2013-05-08 DIAGNOSIS — E785 Hyperlipidemia, unspecified: Secondary | ICD-10-CM

## 2013-05-08 DIAGNOSIS — N390 Urinary tract infection, site not specified: Secondary | ICD-10-CM

## 2013-05-08 DIAGNOSIS — E1165 Type 2 diabetes mellitus with hyperglycemia: Principal | ICD-10-CM

## 2013-05-08 LAB — CBC WITH DIFFERENTIAL/PLATELET
BASOS PCT: 0.9 % (ref 0.0–3.0)
Basophils Absolute: 0.1 10*3/uL (ref 0.0–0.1)
EOS ABS: 0.2 10*3/uL (ref 0.0–0.7)
Eosinophils Relative: 2 % (ref 0.0–5.0)
HCT: 43.4 % (ref 36.0–46.0)
HEMOGLOBIN: 13.9 g/dL (ref 12.0–15.0)
Lymphocytes Relative: 36.5 % (ref 12.0–46.0)
Lymphs Abs: 3.3 10*3/uL (ref 0.7–4.0)
MCHC: 32 g/dL (ref 30.0–36.0)
MCV: 80.3 fl (ref 78.0–100.0)
MONO ABS: 0.7 10*3/uL (ref 0.1–1.0)
Monocytes Relative: 7.6 % (ref 3.0–12.0)
NEUTROS PCT: 53 % (ref 43.0–77.0)
Neutro Abs: 4.8 10*3/uL (ref 1.4–7.7)
Platelets: 268 10*3/uL (ref 150.0–400.0)
RBC: 5.41 Mil/uL — AB (ref 3.87–5.11)
RDW: 15.8 % — ABNORMAL HIGH (ref 11.5–14.6)
WBC: 9 10*3/uL (ref 4.5–10.5)

## 2013-05-08 LAB — HEPATIC FUNCTION PANEL
ALK PHOS: 125 U/L — AB (ref 39–117)
ALT: 16 U/L (ref 0–35)
AST: 18 U/L (ref 0–37)
Albumin: 3.6 g/dL (ref 3.5–5.2)
BILIRUBIN DIRECT: 0 mg/dL (ref 0.0–0.3)
BILIRUBIN TOTAL: 0.5 mg/dL (ref 0.3–1.2)
Total Protein: 7.5 g/dL (ref 6.0–8.3)

## 2013-05-08 LAB — BASIC METABOLIC PANEL
BUN: 17 mg/dL (ref 6–23)
CALCIUM: 9.5 mg/dL (ref 8.4–10.5)
CO2: 27 meq/L (ref 19–32)
Chloride: 101 mEq/L (ref 96–112)
Creatinine, Ser: 0.8 mg/dL (ref 0.4–1.2)
GFR: 70.09 mL/min (ref >60.00)
GLUCOSE: 155 mg/dL — AB (ref 70–99)
POTASSIUM: 4.1 meq/L (ref 3.5–5.1)
SODIUM: 134 meq/L — AB (ref 135–145)

## 2013-05-08 LAB — HEMOGLOBIN A1C: Hgb A1c MFr Bld: 7.3 % — ABNORMAL HIGH (ref 4.6–6.5)

## 2013-05-08 LAB — LIPID PANEL
CHOL/HDL RATIO: 8
Cholesterol: 291 mg/dL — ABNORMAL HIGH (ref 0–200)
HDL: 34.8 mg/dL — AB (ref >39.00)
Triglycerides: 732 mg/dL — ABNORMAL HIGH (ref 0.0–149.0)
VLDL: 146.4 mg/dL — AB (ref 0.0–40.0)

## 2013-05-08 LAB — LDL CHOLESTEROL, DIRECT: Direct LDL: 93.8 mg/dL

## 2013-05-08 LAB — MICROALBUMIN / CREATININE URINE RATIO
Creatinine,U: 135.4 mg/dL
Microalb Creat Ratio: 34.2 mg/g — ABNORMAL HIGH (ref 0.0–30.0)
Microalb, Ur: 46.3 mg/dL — ABNORMAL HIGH (ref 0.0–1.9)

## 2013-05-08 MED ORDER — ONETOUCH ULTRASOFT LANCETS MISC: Status: DC

## 2013-05-08 MED ORDER — GLUCOSE BLOOD VI STRP: ORAL_STRIP | Status: DC

## 2013-05-08 MED ORDER — INSULIN ASPART 100 UNIT/ML FLEXPEN: PEN_INJECTOR | SUBCUTANEOUS | Status: DC

## 2013-05-08 NOTE — Assessment & Plan Note (Signed)
Check labs con't meds 

## 2013-05-08 NOTE — Assessment & Plan Note (Addendum)
Pt not on meds secondary to myalgia Check albs

## 2013-05-08 NOTE — Progress Notes (Signed)
   Subjective:    Patient ID: GERALDY AKRIDGE, female    DOB: 10-13-1929, 80 y.o.   MRN: [id number]  HPI  DIABETES  Blood Sugar ranges-good per pt --- she did not bring in her readings  Polyuria- no New Visual problems- no Hypoglycemic symptoms- no Other side effects-no Medication compliance - good Last eye exam- due Foot exam-   HYPERLIPIDEMIA  Medication compliance- poor RUQ pain- no  Muscle aches- no Other side effects-no  ROS See HPI above   PMH Smoking Status noted       Review of Systems    as above Objective:   Physical Exam  BP 132/74  Pulse 76  Temp(Src) 98.5 F (36.9 C) (Oral)  Wt 147 lb 12.8 oz (67.042 kg)  SpO2 96% General appearance: alert, cooperative, appears stated age and no distress Eyes: conjunctivae/corneas clear. PERRL, EOM's intact. Fundi benign. Neck: no adenopathy, supple, symmetrical, trachea midline and thyroid not enlarged, symmetric, no tenderness/mass/nodules Lungs: clear to auscultation bilaterally Heart: S1, S2 normal Extremities: extremities normal, atraumatic, no cyanosis or edema Sensory exam of the foot is normal, tested with the monofilament. Good pulses, no lesions or ulcers, good peripheral pulses.      Assessment & Plan:

## 2013-05-08 NOTE — Patient Instructions (Signed)

## 2013-05-08 NOTE — Progress Notes (Signed)
Pre visit review using our clinic review tool, if applicable. No additional management support is needed unless otherwise documented below in the visit note. 

## 2013-05-09 LAB — POCT URINALYSIS DIPSTICK
BILIRUBIN UA: NEGATIVE
Glucose, UA: NEGATIVE
KETONES UA: NEGATIVE
Nitrite, UA: NEGATIVE
PH UA: 6.5
PROTEIN UA: NEGATIVE
SPEC GRAV UA: 1.015
Urobilinogen, UA: 0.2

## 2013-05-10 ENCOUNTER — Telehealth: Payer: Self-pay

## 2013-05-10 NOTE — Telephone Encounter (Signed)
Relevant patient education mailed to patient.  

## 2013-05-11 LAB — URINE CULTURE

## 2013-05-16 ENCOUNTER — Telehealth: Payer: Self-pay

## 2013-05-16 MED ORDER — SULFAMETHOXAZOLE-TMP DS 800-160 MG PO TABS: 1.0000 | ORAL_TABLET | Freq: Two times a day (BID) | ORAL | Status: DC

## 2013-05-16 NOTE — Telephone Encounter (Signed)
Notes Recorded by Rosalita Chessman, DO on 05/13/2013 at 12:22 PM + UTI--- Bactrim ds 1 po bid for 7 days  Recheck ua c &s in 2 weeks Notes Recorded by Rosalita Chessman, DO on 05/11/2013 at 12:13 PM DM not controlled--- refer to Endo Cholesterol--- LDL goal < 70, HDL >40, TG < 150. Diet and exercise will increase HDL and decrease LDL and TG. Fish, Fish Oil, Flaxseed oil will also help increase the HDL and decrease Triglycerides. Recheck labs in 3 months----- Either try statin zocor 20mg  1 po qhs, #30 2 refills or refer to lipid clinic Recheck 3 months----272.4 lipid, , hep 250.00 Bmp, hgba1c.

## 2013-05-16 NOTE — Telephone Encounter (Signed)
noted 

## 2013-05-16 NOTE — Telephone Encounter (Signed)
Message copied by Ewing Schlein on Thu May 16, 2013  4:20 PM ------      Message from: Rosalita Chessman      Created: Mon May 13, 2013 12:22 PM       + UTI--- Bactrim ds 1 po bid for 7 days       Recheck ua c &s in 2 weeks ------

## 2013-05-16 NOTE — Telephone Encounter (Signed)
Spoke with patient and she stated she was old and did not want to go to all of these different doctors. She said she is not taking her insulin or eating like she should due to lack of money, she said she will not take the statin drugs because they made her sick int he past and she thought she was dying. She said she saw the hear doctor and she would never take a statin drug or fenofibrate again. She declined all referrals and medication at this time and said she just wanted to stop taking everything at this point due to her age and financial situation. She also said she got the wrong lancets but did not want me to send anything in because her insurance company was going to send her a new meter.  No med's or referrals called in at this time. I advised I would cal in Abx for her UTI and she agreed to take that, I made her aware I would make Dr.Lowne aware that she declined everything else.       KP

## 2013-06-12 ENCOUNTER — Other Ambulatory Visit: Payer: Self-pay | Admitting: Family Medicine

## 2013-07-16 ENCOUNTER — Telehealth: Payer: Self-pay | Admitting: Family Medicine

## 2013-07-16 DIAGNOSIS — IMO0002 Reserved for concepts with insufficient information to code with codable children: Secondary | ICD-10-CM

## 2013-07-16 DIAGNOSIS — E1165 Type 2 diabetes mellitus with hyperglycemia: Principal | ICD-10-CM

## 2013-07-16 DIAGNOSIS — E1122 Type 2 diabetes mellitus with diabetic chronic kidney disease: Secondary | ICD-10-CM

## 2013-07-16 MED ORDER — ONETOUCH ULTRASOFT LANCETS MISC: Status: DC

## 2013-07-16 NOTE — Telephone Encounter (Signed)
Patient called to request a refill for Lancets (ONETOUCH ULTRASOFT) [place] ([place]), [place]

## 2013-07-18 ENCOUNTER — Telehealth: Payer: Self-pay | Admitting: Family Medicine

## 2013-07-18 DIAGNOSIS — IMO0002 Reserved for concepts with insufficient information to code with codable children: Secondary | ICD-10-CM

## 2013-07-18 DIAGNOSIS — E1165 Type 2 diabetes mellitus with hyperglycemia: Principal | ICD-10-CM

## 2013-07-18 DIAGNOSIS — E1122 Type 2 diabetes mellitus with diabetic chronic kidney disease: Secondary | ICD-10-CM

## 2013-07-18 MED ORDER — GLUCOSE BLOOD VI STRP: ORAL_STRIP | Status: DC

## 2013-07-18 NOTE — Telephone Encounter (Signed)
Patient called and stated that she needed glucose blood test strips refilled. It was a misunderstanding when she called in the other day what she needed refilled. Please advise thanks

## 2013-07-18 NOTE — Telephone Encounter (Signed)
Rx faxed.    KP 

## 2013-10-24 ENCOUNTER — Ambulatory Visit: Payer: Medicare Other | Admitting: Vascular Surgery

## 2013-10-24 ENCOUNTER — Encounter (HOSPITAL_COMMUNITY): Payer: Medicare Other

## 2013-10-24 ENCOUNTER — Ambulatory Visit: Payer: Medicare Other | Admitting: Family

## 2013-11-13 ENCOUNTER — Encounter: Payer: Self-pay | Admitting: Family

## 2013-11-14 ENCOUNTER — Encounter (HOSPITAL_COMMUNITY): Payer: Medicare Other

## 2013-11-14 ENCOUNTER — Ambulatory Visit: Payer: Medicare Other | Admitting: Family

## 2013-12-18 LAB — HM DIABETES EYE EXAM

## 2013-12-23 ENCOUNTER — Encounter (INDEPENDENT_AMBULATORY_CARE_PROVIDER_SITE_OTHER): Payer: Medicare Other | Admitting: Ophthalmology

## 2013-12-23 DIAGNOSIS — H26499 Other secondary cataract, unspecified eye: Secondary | ICD-10-CM | POA: Diagnosis not present

## 2013-12-23 DIAGNOSIS — H43819 Vitreous degeneration, unspecified eye: Secondary | ICD-10-CM

## 2013-12-23 DIAGNOSIS — E1165 Type 2 diabetes mellitus with hyperglycemia: Secondary | ICD-10-CM

## 2013-12-23 DIAGNOSIS — E1139 Type 2 diabetes mellitus with other diabetic ophthalmic complication: Secondary | ICD-10-CM | POA: Diagnosis not present

## 2013-12-23 DIAGNOSIS — E11319 Type 2 diabetes mellitus with unspecified diabetic retinopathy without macular edema: Secondary | ICD-10-CM | POA: Diagnosis not present

## 2013-12-31 ENCOUNTER — Telehealth: Payer: Self-pay

## 2013-12-31 NOTE — Telephone Encounter (Signed)
Pt not interested °

## 2014-01-20 ENCOUNTER — Ambulatory Visit (INDEPENDENT_AMBULATORY_CARE_PROVIDER_SITE_OTHER): Payer: Medicare Other | Admitting: Ophthalmology

## 2014-01-20 DIAGNOSIS — H2702 Aphakia, left eye: Secondary | ICD-10-CM

## 2014-01-20 DIAGNOSIS — H26491 Other secondary cataract, right eye: Secondary | ICD-10-CM

## 2014-01-27 ENCOUNTER — Ambulatory Visit (INDEPENDENT_AMBULATORY_CARE_PROVIDER_SITE_OTHER): Payer: Medicare Other | Admitting: Ophthalmology

## 2014-01-27 DIAGNOSIS — H2703 Aphakia, bilateral: Secondary | ICD-10-CM

## 2014-02-17 ENCOUNTER — Telehealth: Payer: Self-pay

## 2014-02-17 NOTE — Telephone Encounter (Signed)
Report from [place]. Dr. Etter Sjogren has requested to repeat labs and if the patient has not been seen to schedule a follow up.  Spoke with the patient and she stated she she did not want to come in, she said she is not taking her insulin that was prescribed. She went to [facility] and got something called R and N (relion) which is supposed to take the place of insulin. It is only 25 dollars and her insulin was costing her over 200 dollars. She declined labs as well, said she hates doctors and did not want to see anyone at this time, said she will follow up after she see's Dr.Groat again but did not know when that was.

## 2014-02-18 ENCOUNTER — Encounter: Payer: Self-pay | Admitting: Family Medicine

## 2014-02-20 ENCOUNTER — Telehealth: Payer: Self-pay | Admitting: Family Medicine

## 2014-02-20 NOTE — Telephone Encounter (Signed)
Dismissal Letter sent by Certified Mail [id number]  Received the Return Receipt showing someone picked up the Dismissal 02/25/2014

## 2014-02-24 ENCOUNTER — Telehealth: Payer: Self-pay

## 2014-02-24 NOTE — Telephone Encounter (Signed)
Patient called in to discuss the reason for dismissal. Advised per notations in the chart. Referred to [facility] for recommendations for new provider.

## 2014-08-18 ENCOUNTER — Encounter: Payer: Self-pay | Admitting: Internal Medicine

## 2014-08-19 ENCOUNTER — Telehealth: Payer: Self-pay | Admitting: Endocrinology

## 2014-08-19 NOTE — Telephone Encounter (Signed)
Pt called office. She was previously under care of Dr. Etter Sjogren. She was released from [facility] in 2015. Pt stating she signed release of records for [place] to send complete med records to Dr. Chalmers Cater some time in the last month. No release of records on file for this. Pt will come into the office to sign again and wants records faxed to Dr. Chalmers Cater asap so she can schedule an appt.

## 2014-08-20 ENCOUNTER — Encounter: Payer: Self-pay | Admitting: Podiatry

## 2014-08-20 ENCOUNTER — Ambulatory Visit (INDEPENDENT_AMBULATORY_CARE_PROVIDER_SITE_OTHER): Payer: PPO | Admitting: Podiatry

## 2014-08-20 VITALS — BP 159/91 | HR 71 | Temp 97.6°F | Resp 12

## 2014-08-20 DIAGNOSIS — E1151 Type 2 diabetes mellitus with diabetic peripheral angiopathy without gangrene: Secondary | ICD-10-CM | POA: Diagnosis not present

## 2014-08-20 DIAGNOSIS — L84 Corns and callosities: Secondary | ICD-10-CM

## 2014-08-20 NOTE — Patient Instructions (Signed)
Do not use over-the-counter corn remover medication either liquid or paste form Apply nonmedicated foam pad around corn on fifth left toe daily and attach with one-inch Coflex tape, do not overtighten Continue to apply padding until all soreness ends Wear softer open toe shoes until the fifth toe is comfortable  Diabetes and Foot Care Diabetes may cause you to have problems because of poor blood supply (circulation) to your feet and legs. This may cause the skin on your feet to become thinner, break easier, and heal more slowly. Your skin may become dry, and the skin may peel and crack. You may also have nerve damage in your legs and feet causing decreased feeling in them. You may not notice minor injuries to your feet that could lead to infections or more serious problems. Taking care of your feet is one of the most important things you can do for yourself.  HOME CARE INSTRUCTIONS  Wear shoes at all times, even in the house. Do not go barefoot. Bare feet are easily injured.  Check your feet daily for blisters, cuts, and redness. If you cannot see the bottom of your feet, use a mirror or ask someone for help.  Wash your feet with warm water (do not use hot water) and mild soap. Then pat your feet and the areas between your toes until they are completely dry. Do not soak your feet as this can dry your skin.  Apply a moisturizing lotion or petroleum jelly (that does not contain alcohol and is unscented) to the skin on your feet and to dry, brittle toenails. Do not apply lotion between your toes.  Trim your toenails straight across. Do not dig under them or around the cuticle. File the edges of your nails with an emery board or nail file.  Do not cut corns or calluses or try to remove them with medicine.  Wear clean socks or stockings every day. Make sure they are not too tight. Do not wear knee-high stockings since they may decrease blood flow to your legs.  Wear shoes that fit properly and have  enough cushioning. To break in new shoes, wear them for just a few hours a day. This prevents you from injuring your feet. Always look in your shoes before you put them on to be sure there are no objects inside.  Do not cross your legs. This may decrease the blood flow to your feet.  If you find a minor scrape, cut, or break in the skin on your feet, keep it and the skin around it clean and dry. These areas may be cleansed with mild soap and water. Do not cleanse the area with peroxide, alcohol, or iodine.  When you remove an adhesive bandage, be sure not to damage the skin around it.  If you have a wound, look at it several times a day to make sure it is healing.  Do not use heating pads or hot water bottles. They may burn your skin. If you have lost feeling in your feet or legs, you may not know it is happening until it is too late.  Make sure your health care provider performs a complete foot exam at least annually or more often if you have foot problems. Report any cuts, sores, or bruises to your health care provider immediately. SEEK MEDICAL CARE IF:   You have an injury that is not healing.  You have cuts or breaks in the skin.  You have an ingrown nail.  You notice  redness on your legs or feet.  You feel burning or tingling in your legs or feet.  You have pain or cramps in your legs and feet.  Your legs or feet are numb.  Your feet always feel cold. SEEK IMMEDIATE MEDICAL CARE IF:   There is increasing redness, swelling, or pain in or around a wound.  There is a red line that goes up your leg.  Pus is coming from a wound.  You develop a fever or as directed by your health care provider.  You notice a bad smell coming from an ulcer or wound. Document Released: 03/25/2000 Document Revised: 11/28/2012 Document Reviewed: 09/04/2012 [facility] Patient Information 2015 [place], [place]. This information is not intended to replace advice given to you by your health care  provider. Make sure you discuss any questions you have with your health care provider.

## 2014-08-20 NOTE — Progress Notes (Signed)
   Subjective:    Patient ID: [name], female    DOB: [birth year], 81 y.o.   MRN: [id number]  HPI N-painful  L- left 5th toe D- 1 month O-gradual C- sore to touch A- if touched T-corn medicine, not remember the name, helped a lot  "The patient presents today  with left 5th toe soreness  to touch and painful"   Patient admits to apply medicated corn remover to fifth toe left foot approximately 2 applications in the past week which she said significantly reduce the discomfort. She also has described wearing a variety of protective devices including a digital like silicone sleeve and a gelcap type device which she said did not provide any relief of symptoms  She denies a history of foot ulceration, however, has a history of claudication  Review of Systems  Cardiovascular:       Calf pain when walking  Gastrointestinal: Positive for constipation.  Skin:       Change in nails  Neurological: Positive for numbness.       Objective:   Physical Exam  Orientated 3 vascular: DP pulses 0/4 bilaterally PT pulses 0/4 bilaterally Mild pitting edema ankles bilaterally  Neurological: Sensation to 10 g monofilament wire intact 4/5 right and 5/5 left Vibratory sensation nonreactive bilaterally Ankle reflex equal and reactive bilaterally  Dermatological: Small residual keratoses lateral PIP joints fifth left toe without any surrounding erythema, edema, drainage or open wound.  Musculoskeletal: Hammertoe deformities fifth digits bilaterally There is no restriction ankle, subtalar mid tarsal joints, bilaterally     Assessment & Plan:   Assessment: Decrease pedal pulses consistent with known history of peripheral arterial disease Loss of vibratory sensation consistent with diabetic peripheral neuropathy Hammertoe deformity fifth with associated keratoses  Plan: Review the results of patient examination today I advised patient not to apply any medicated corn removers to the  fifth toe corn in the future. As she has peripheral arterial disease.  I debrided the residual keratoses without any bleeding. Apply small amount of topical antibiotic ointment and a protective foam pad around the area. I advised patient to apply a foam pad and attach with one-inch Coflex tape on a daily basis until all symptoms are resolved  Also, recommended that she wear wide toe box shoes  Reappoint as needed or at yearly intervals

## 2014-10-22 ENCOUNTER — Telehealth: Payer: Self-pay | Admitting: General Practice

## 2014-10-22 NOTE — Telephone Encounter (Signed)
Caller name: [name] Relationship to patient: daughter Can be reached: [phone]  Reason for call: Pt is now living with her daughter, [name] [name] has a special needs child as well. She is upset that pt is not being accepted at any [facility] and she was hoping to get her into [facility] as it is 10 minutes from her house. [name] is a pt at [employer] and would like an exception. She is requesting a call back to discuss.

## 2014-10-23 NOTE — Telephone Encounter (Signed)
Sent message to practice admin. at [facility], will call daughter back once I receive an answer

## 2014-11-04 ENCOUNTER — Encounter: Payer: Self-pay | Admitting: Family Medicine

## 2014-11-04 ENCOUNTER — Ambulatory Visit (INDEPENDENT_AMBULATORY_CARE_PROVIDER_SITE_OTHER): Payer: PPO | Admitting: Family Medicine

## 2014-11-04 VITALS — BP 140/70 | HR 67 | Temp 97.5°F | Ht 63.0 in | Wt 145.2 lb

## 2014-11-04 DIAGNOSIS — R319 Hematuria, unspecified: Secondary | ICD-10-CM

## 2014-11-04 DIAGNOSIS — E1349 Other specified diabetes mellitus with other diabetic neurological complication: Secondary | ICD-10-CM

## 2014-11-04 DIAGNOSIS — K5900 Constipation, unspecified: Secondary | ICD-10-CM

## 2014-11-04 DIAGNOSIS — Z9889 Other specified postprocedural states: Secondary | ICD-10-CM | POA: Diagnosis not present

## 2014-11-04 DIAGNOSIS — E785 Hyperlipidemia, unspecified: Secondary | ICD-10-CM | POA: Diagnosis not present

## 2014-11-04 DIAGNOSIS — E890 Postprocedural hypothyroidism: Secondary | ICD-10-CM

## 2014-11-04 DIAGNOSIS — E1340 Other specified diabetes mellitus with diabetic neuropathy, unspecified: Secondary | ICD-10-CM | POA: Diagnosis not present

## 2014-11-04 DIAGNOSIS — E1142 Type 2 diabetes mellitus with diabetic polyneuropathy: Secondary | ICD-10-CM | POA: Diagnosis not present

## 2014-11-04 DIAGNOSIS — K2100 Gastro-esophageal reflux disease with esophagitis, without bleeding: Secondary | ICD-10-CM

## 2014-11-04 DIAGNOSIS — G629 Polyneuropathy, unspecified: Secondary | ICD-10-CM

## 2014-11-04 DIAGNOSIS — I739 Peripheral vascular disease, unspecified: Secondary | ICD-10-CM | POA: Diagnosis not present

## 2014-11-04 DIAGNOSIS — Z9009 Acquired absence of other part of head and neck: Secondary | ICD-10-CM | POA: Insufficient documentation

## 2014-11-04 DIAGNOSIS — K5909 Other constipation: Secondary | ICD-10-CM | POA: Insufficient documentation

## 2014-11-04 LAB — HEMOGLOBIN A1C: Hgb A1c MFr Bld: 6.3 % (ref 4.6–6.5)

## 2014-11-04 LAB — POCT URINALYSIS DIPSTICK
Bilirubin, UA: NEGATIVE
Glucose, UA: NEGATIVE
KETONES UA: NEGATIVE
NITRITE UA: NEGATIVE
PH UA: 6
Protein, UA: NEGATIVE
RBC UA: NEGATIVE
Spec Grav, UA: 1.02
Urobilinogen, UA: 0.2

## 2014-11-04 LAB — COMPREHENSIVE METABOLIC PANEL
ALT: 10 U/L (ref 0–35)
AST: 15 U/L (ref 0–37)
Albumin: 3.8 g/dL (ref 3.5–5.2)
Alkaline Phosphatase: 102 U/L (ref 39–117)
BILIRUBIN TOTAL: 0.5 mg/dL (ref 0.2–1.2)
BUN: 20 mg/dL (ref 6–23)
CALCIUM: 9.4 mg/dL (ref 8.4–10.5)
CO2: 25 mEq/L (ref 19–32)
CREATININE: 0.88 mg/dL (ref 0.40–1.20)
Chloride: 102 mEq/L (ref 96–112)
GFR: 65.27 mL/min (ref >60.00)
Glucose, Bld: 119 mg/dL — ABNORMAL HIGH (ref 70–99)
Potassium: 4.5 mEq/L (ref 3.5–5.1)
Sodium: 135 mEq/L (ref 135–145)
Total Protein: 7.4 g/dL (ref 6.0–8.3)

## 2014-11-04 LAB — MICROALBUMIN / CREATININE URINE RATIO
Creatinine,U: 111.1 mg/dL
MICROALB/CREAT RATIO: 6.3 mg/g (ref 0.0–30.0)
Microalb, Ur: 7 mg/dL — ABNORMAL HIGH (ref 0.0–1.9)

## 2014-11-04 LAB — HM DIABETES FOOT EXAM

## 2014-11-04 LAB — LDL CHOLESTEROL, DIRECT: LDL DIRECT: 102 mg/dL

## 2014-11-04 LAB — LIPID PANEL
CHOL/HDL RATIO: 7
CHOLESTEROL: 212 mg/dL — AB (ref 0–200)
HDL: 32.4 mg/dL — ABNORMAL LOW (ref >39.00)
NonHDL: 179.6
TRIGLYCERIDES: 310 mg/dL — AB (ref 0.0–149.0)
VLDL: 62 mg/dL — ABNORMAL HIGH (ref 0.0–40.0)

## 2014-11-04 LAB — TSH: TSH: 1.06 u[IU]/mL (ref 0.35–4.50)

## 2014-11-04 NOTE — Addendum Note (Signed)
Addended by: Carter Kitten on: 11/04/2014 10:35 AM   Modules accepted: Orders

## 2014-11-04 NOTE — Assessment & Plan Note (Signed)
Stable on no med. Limits ambulation.

## 2014-11-04 NOTE — Assessment & Plan Note (Signed)
Med in past has caused SE.

## 2014-11-04 NOTE — Assessment & Plan Note (Signed)
Due for re-eval. Goal LDL  70. SE to statin and fenofibrate.

## 2014-11-04 NOTE — Patient Instructions (Addendum)
Work on low [employer].   Try to walk as much as able.  Stop at lab on way out.

## 2014-11-04 NOTE — Addendum Note (Signed)
Addended by: Ellamae Sia on: 11/04/2014 12:49 PM   Modules accepted: Orders

## 2014-11-04 NOTE — Assessment & Plan Note (Signed)
Stable with stool softner and laxative.

## 2014-11-04 NOTE — Progress Notes (Signed)
Subjective:    Patient ID: [name], female    DOB: 1929/06/28, 81 y.o.   MRN: [id number]  HPI  81 year old female pt here to transfer care from Dr. Etter Sjogren to me. She was discharged from Dr. Etter Sjogren given noncompliance with recommendations.  He daughter is a patient of mine.  She is now more open to recommendations and daughter is more involved in her care at this time. She has now moved in with daughter.  She also reports that at the time she did not take recommendations.. She had a lot of family issues.   Diabetes:   Due for re-eval. She is using insulin NPH 15 at bedtime and regular 10 units prior to each meal. She was placed on insulin given she felt so bad on pills. Lab Results  Component Value Date   HGBA1C 7.3* 05/08/2013  Using medications without difficulties: none Hypoglycemic episodes: None Hyperglycemic episodes: occ if eating carbs Feet problems: Has neuropathy Blood Sugars averaging: FBS  173, 2 hours post prandial eye exam within last year: yes Trying to eat low carb diet. Has been years since she went to nutritionist.   Elevated Cholesterol:  Due for re-eval. She is on no med, her goal s LDL < 70 given her PAD. Lab Results  Component Value Date   CHOL 291* 05/08/2013   HDL 34.80* 05/08/2013   LDLCALC 79 04/13/2007   LDLDIRECT 93.8 05/08/2013   TRIG * 05/08/2013    732.0 Triglyceride is over 400; calculations on Lipids are invalid.   CHOLHDL 8 05/08/2013  Using medications without problems: Statins and fenofibrate cause SE in past.  Muscle aches:  Diet compliance:moderate Exercise: minimal Other complaints:   Urinary frequency, told in past she has hematuria  In past but never went to kidney referral. She does not see any blood in urine.  No dysuria.  Review of Systems  Constitutional: Positive for fatigue. Negative for fever.  HENT: Negative for ear pain.   Eyes: Negative for pain.  Respiratory: Negative for chest tightness and shortness of  breath.   Cardiovascular: Negative for chest pain, palpitations and leg swelling.  Gastrointestinal: Negative for abdominal pain.  Genitourinary: Negative for dysuria.       Objective:   Physical Exam  Constitutional: Vital signs are normal. She appears well-developed and well-nourished. She is cooperative.  Non-toxic appearance. She does not appear ill. No distress.  Elderly female  HENT:  Head: Normocephalic.  Right Ear: Hearing, tympanic membrane, external ear and ear canal normal.  Left Ear: Hearing, tympanic membrane, external ear and ear canal normal.  Nose: Nose normal.  Eyes: Conjunctivae, EOM and lids are normal. Pupils are equal, round, and reactive to light. Lids are everted and swept, no foreign bodies found.  Neck: Trachea normal and normal range of motion. Neck supple. Carotid bruit is not present. No thyroid mass and no thyromegaly present.  Cardiovascular: Normal rate, regular rhythm, S1 normal, S2 normal, normal heart sounds and intact distal pulses.  Exam reveals no gallop.   No murmur heard. Pulmonary/Chest: Effort normal and breath sounds normal. No respiratory distress. She has no wheezes. She has no rhonchi. She has no rales.  Abdominal: Soft. Normal appearance and bowel sounds are normal. She exhibits no distension, no fluid wave, no abdominal bruit and no mass. There is no hepatosplenomegaly. There is no tenderness. There is no rebound, no guarding and no CVA tenderness. No hernia.  Lymphadenopathy:    She has no cervical adenopathy.  She has no axillary adenopathy.  Neurological: She is alert. She has normal strength. No cranial nerve deficit or sensory deficit.  Skin: Skin is warm, dry and intact. No rash noted.  Psychiatric: Her speech is normal and behavior is normal. Judgment normal. Her mood appears not anxious. Cognition and memory are normal. She does not exhibit a depressed mood.      Diabetic foot exam: Normal inspection No skin breakdown   bilateral calluses  Normal DP pulses Decreased sensation to light touch and monofilament Nails thickened.     Assessment & Plan:

## 2014-11-04 NOTE — Assessment & Plan Note (Signed)
Due for re-eval. Encouraged exercise, weight loss, healthy eating habits. INfo on low carb diet given.

## 2014-11-04 NOTE — Assessment & Plan Note (Signed)
Well controlled on omeprazole 20 mg twice daily.

## 2014-11-04 NOTE — Progress Notes (Signed)
Pre visit review using our clinic review tool, if applicable. No additional management support is needed unless otherwise documented below in the visit note. 

## 2014-11-06 ENCOUNTER — Telehealth: Payer: Self-pay | Admitting: Family Medicine

## 2014-11-06 DIAGNOSIS — E1129 Type 2 diabetes mellitus with other diabetic kidney complication: Secondary | ICD-10-CM

## 2014-11-06 DIAGNOSIS — R809 Proteinuria, unspecified: Principal | ICD-10-CM

## 2014-11-06 MED ORDER — COLESEVELAM HCL 625 MG PO TABS: 625.0000 mg | ORAL_TABLET | Freq: Two times a day (BID) | ORAL | Status: DC

## 2014-11-06 MED ORDER — LISINOPRIL 2.5 MG PO TABS: 2.5000 mg | ORAL_TABLET | Freq: Every day | ORAL | Status: DC

## 2014-11-06 NOTE — Telephone Encounter (Signed)
-----   Message from Carter Kitten, [place] sent at 11/06/2014  3:55 PM EDT ----- Ms. [name] notified as instructed by telephone.  She has not ever tried [employer] but is willing to try it.  She is also agreeable to starting a low dose medication to protect her kidneys.   Please send in prescriptions to [facility].

## 2014-11-21 ENCOUNTER — Other Ambulatory Visit: Payer: Self-pay

## 2014-11-21 DIAGNOSIS — E1122 Type 2 diabetes mellitus with diabetic chronic kidney disease: Secondary | ICD-10-CM

## 2014-11-21 DIAGNOSIS — E1165 Type 2 diabetes mellitus with hyperglycemia: Principal | ICD-10-CM

## 2014-11-21 DIAGNOSIS — IMO0002 Reserved for concepts with insufficient information to code with codable children: Secondary | ICD-10-CM

## 2014-11-21 MED ORDER — GLUCOSE BLOOD VI STRP: ORAL_STRIP | Status: DC

## 2014-11-21 NOTE — Telephone Encounter (Signed)
Pt request one touch test strip refills to [facility] [address]. Pt established with Dr Diona Browner on 11/04/14. Pt states she checks BS 4 times a day.Please advise.

## 2015-01-20 LAB — HM DIABETES EYE EXAM

## 2015-02-12 ENCOUNTER — Telehealth: Payer: Self-pay | Admitting: Family Medicine

## 2015-02-12 DIAGNOSIS — E785 Hyperlipidemia, unspecified: Secondary | ICD-10-CM

## 2015-02-12 DIAGNOSIS — E1349 Other specified diabetes mellitus with other diabetic neurological complication: Secondary | ICD-10-CM

## 2015-02-12 NOTE — Telephone Encounter (Signed)
-----   Message from Ellamae Sia sent at 02/10/2015  8:43 AM EDT ----- Regarding: Lab orders for Friday, 11.4.16 Patient is scheduled for CPX labs, please order future labs, Thanks , Karna Christmas

## 2015-02-13 ENCOUNTER — Other Ambulatory Visit (INDEPENDENT_AMBULATORY_CARE_PROVIDER_SITE_OTHER): Payer: PPO

## 2015-02-13 DIAGNOSIS — E1349 Other specified diabetes mellitus with other diabetic neurological complication: Secondary | ICD-10-CM | POA: Diagnosis not present

## 2015-02-13 DIAGNOSIS — E785 Hyperlipidemia, unspecified: Secondary | ICD-10-CM | POA: Diagnosis not present

## 2015-02-13 LAB — COMPREHENSIVE METABOLIC PANEL
ALT: 15 U/L (ref 0–35)
AST: 15 U/L (ref 0–37)
Albumin: 3.8 g/dL (ref 3.5–5.2)
Alkaline Phosphatase: 110 U/L (ref 39–117)
BILIRUBIN TOTAL: 0.6 mg/dL (ref 0.2–1.2)
BUN: 21 mg/dL (ref 6–23)
CHLORIDE: 103 meq/L (ref 96–112)
CO2: 30 meq/L (ref 19–32)
CREATININE: 0.9 mg/dL (ref 0.40–1.20)
Calcium: 10 mg/dL (ref 8.4–10.5)
GFR: 63.56 mL/min (ref >60.00)
GLUCOSE: 100 mg/dL — AB (ref 70–99)
Potassium: 4.2 mEq/L (ref 3.5–5.1)
SODIUM: 139 meq/L (ref 135–145)
Total Protein: 7.5 g/dL (ref 6.0–8.3)

## 2015-02-13 LAB — LIPID PANEL
CHOL/HDL RATIO: 6
Cholesterol: 224 mg/dL — ABNORMAL HIGH (ref 0–200)
HDL: 38.1 mg/dL — ABNORMAL LOW (ref >39.00)
NONHDL: 186.16
Triglycerides: 210 mg/dL — ABNORMAL HIGH (ref 0.0–149.0)
VLDL: 42 mg/dL — ABNORMAL HIGH (ref 0.0–40.0)

## 2015-02-13 LAB — LDL CHOLESTEROL, DIRECT: Direct LDL: 127 mg/dL

## 2015-02-13 LAB — HEMOGLOBIN A1C: HEMOGLOBIN A1C: 6.2 % (ref 4.6–6.5)

## 2015-02-19 ENCOUNTER — Telehealth: Payer: Self-pay | Admitting: Family Medicine

## 2015-02-19 NOTE — Telephone Encounter (Signed)
pts daughter, [name] said [facility] Dept EMTs came out; Pts BP 158/100 but pt was upset and nervous,EKG and other vitals and oxygen level were OK. Pt has GERD and was given prilosec and pt is feeling better but still feels weak; [name] is monitoring pt and if condition worsens will take pt to ED. Pt already has med wellness 02/20/15 at 10 AM with Dr Diona Browner.

## 2015-02-19 NOTE — Telephone Encounter (Signed)
Patient Name: DORTHY GRIGSON DOB: Aug 16, 1929 Initial Comment Caller states mother is having chest pains. Nurse Assessment Nurse: Marcelline Deist, RN, Lynda Date/Time (Eastern Time): 02/19/2015 9:08:45 AM Confirm and document reason for call. If symptomatic, describe symptoms. ---Caller states her mother is having chest pains & sweating, feels weak. She is diabetic. Her blood sugar was 172. Has the patient traveled out of the country within the last 30 days? ---Not Applicable Does the patient have any new or worsening symptoms? ---Yes Will a triage be completed? ---Yes Related visit to physician within the last 2 weeks? ---No Does the PT have any chronic conditions? (i.e. diabetes, asthma, etc.) ---Yes List chronic conditions. ---diabetic Guidelines Guideline Title Affirmed Question Affirmed Notes Chest Pain [1] Chest pain lasts > 5 minutes AND [2] described as crushing, pressure-like, or heavy Final Disposition User Call EMS [place], RN, Kermit Balo Comments Caller states the patient doesn't think her symptoms are a heart attack. Nurse strongly advised calling EMS/911 for transport to hospital. Caller states the fire dept. is 1/4 mile away & she will call up there to have EMS check her mother. Nurse will call back to make sure she was able to reach them. Referrals GO TO FACILITY UNDECIDED Disagree/Comply: Comply

## 2015-02-20 ENCOUNTER — Ambulatory Visit (INDEPENDENT_AMBULATORY_CARE_PROVIDER_SITE_OTHER): Payer: PPO | Admitting: Family Medicine

## 2015-02-20 ENCOUNTER — Encounter: Payer: Self-pay | Admitting: Family Medicine

## 2015-02-20 VITALS — BP 145/99 | HR 73 | Temp 97.6°F | Ht 63.0 in | Wt 145.2 lb

## 2015-02-20 DIAGNOSIS — Z7189 Other specified counseling: Secondary | ICD-10-CM

## 2015-02-20 DIAGNOSIS — K5909 Other constipation: Secondary | ICD-10-CM

## 2015-02-20 DIAGNOSIS — K5900 Constipation, unspecified: Secondary | ICD-10-CM

## 2015-02-20 DIAGNOSIS — K2100 Gastro-esophageal reflux disease with esophagitis, without bleeding: Secondary | ICD-10-CM

## 2015-02-20 DIAGNOSIS — R809 Proteinuria, unspecified: Secondary | ICD-10-CM

## 2015-02-20 DIAGNOSIS — R030 Elevated blood-pressure reading, without diagnosis of hypertension: Secondary | ICD-10-CM | POA: Diagnosis not present

## 2015-02-20 DIAGNOSIS — I10 Essential (primary) hypertension: Secondary | ICD-10-CM

## 2015-02-20 DIAGNOSIS — Z0000 Encounter for general adult medical examination without abnormal findings: Secondary | ICD-10-CM

## 2015-02-20 DIAGNOSIS — Z23 Encounter for immunization: Secondary | ICD-10-CM | POA: Diagnosis not present

## 2015-02-20 DIAGNOSIS — E785 Hyperlipidemia, unspecified: Secondary | ICD-10-CM

## 2015-02-20 DIAGNOSIS — R0789 Other chest pain: Secondary | ICD-10-CM

## 2015-02-20 DIAGNOSIS — E1349 Other specified diabetes mellitus with other diabetic neurological complication: Secondary | ICD-10-CM

## 2015-02-20 DIAGNOSIS — E1159 Type 2 diabetes mellitus with other circulatory complications: Secondary | ICD-10-CM | POA: Insufficient documentation

## 2015-02-20 DIAGNOSIS — E1129 Type 2 diabetes mellitus with other diabetic kidney complication: Secondary | ICD-10-CM

## 2015-02-20 LAB — HM DIABETES FOOT EXAM

## 2015-02-20 NOTE — Progress Notes (Signed)
81 year old female presents for medicare wellness. I have personally reviewed the Medicare Annual Wellness questionnaire and have noted 1. The patient's medical and social history 2. Their use of alcohol, tobacco or illicit drugs 3. Their current medications and supplements 4. The patient's functional ability including ADL's, fall risks, home safety risks and hearing or visual             impairment. 5. Diet and physical activities 6. Evidence for depression or mood disorders 7.         Updated provider list Cognitive evaluation was performed and recorded on pt medicare questionnaire form. The patients weight, height, BMI and visual acuity have been recorded in the chart  I have made referrals, counseling and provided education to the patient based review of the above and I have provided the pt with a written personalized care plan for preventive services.    Yesterday has chest pressure, weakness and sweating.Occured at rest when drinking coffee. Lasted 1-2 hours, went away on her own.  Today feel back to her nml self.  She feels it may have been due to GERD. On omeprazole. Called EMS oxygen nml, BP 158/100. Full EKG: nml sinus EKG.us.. No change when compared to 2016.  Myocardial perfusion test low risk in 2014. Symptoms t that time were due to fenofibrate.   Diabetes:  She is using insulin NPH 15 at bedtime and regular 10 units prior to each meal. She was placed on insulin given she felt so bad on pills. Lab Results  Component Value Date   HGBA1C 6.2 02/13/2015  Using medications without difficulties: none Hypoglycemic episodes: None Hyperglycemic episodes: occ if eating carb Feet problems: Has neuropathy Blood Sugars averaging: FBS 120-154, 2 hours post prandial 200 eye exam within last year: yes. 01/2015 Trying to eat low carb diet. Has been years since she went to nutritionist.  Elevated Cholesterol: Not at goal. She is on no med, her goal s LDL < 70 given her PAD.   Unable to take welchol... Caused weakness Lab Results  Component Value Date   CHOL 224* 02/13/2015   HDL 38.10* 02/13/2015   LDLCALC 79 04/13/2007   LDLDIRECT 127.0 02/13/2015   TRIG 210.0* 02/13/2015   CHOLHDL 6 02/13/2015  Using medications without problems: Statins and fenofibrate cause SE in past.  Muscle aches:  Diet compliance:moderate Exercise: minimal Other complaints:  BP Readings from Last 3 Encounters:  02/20/15 145/99  11/04/14 140/70  08/20/14 159/91  She feels it is high from MD office.   Microalbuminuria: on low dose lisinopril.  Review of Systems  Constitutional: Positive for fatigue. Negative for fever.  HENT: Negative for ear pain.  Eyes: Negative for pain.  Respiratory: Negative for chest tightness and shortness of breath.  Cardiovascular: Negative for chest pain, palpitations and leg swelling.  Gastrointestinal: Negative for abdominal pain.  Constipation Genitourinary: Negative for dysuria.       Objective:   Physical Exam  Constitutional: Vital signs are normal. She appears well-developed and well-nourished. She is cooperative. Non-toxic appearance. She does not appear ill. No distress.  Elderly female  HENT:  Head: Normocephalic.  Right Ear: Hearing, tympanic membrane, external ear and ear canal normal.  Left Ear: Hearing, tympanic membrane, external ear and ear canal normal.  Nose: Nose normal.  Eyes: Conjunctivae, EOM and lids are normal. Pupils are equal, round, and reactive to light. Lids are everted and swept, no foreign bodies found.  Neck: Trachea normal and normal range of motion. Neck supple.  Carotid bruit is not present. No thyroid mass and no thyromegaly present.  Cardiovascular: Normal rate, regular rhythm, S1 normal, S2 normal, systolic murmur grade 2 and intact distal pulses. Exam reveals no gallop.  No murmur heard. Pulmonary/Chest: Effort normal and breath sounds normal. No respiratory distress. She has no wheezes. She  has no rhonchi. She has no rales.  Abdominal: Soft. Normal appearance and bowel sounds are normal. She exhibits no distension, no fluid wave, no abdominal bruit and no mass. There is no hepatosplenomegaly. There is no tenderness. There is no rebound, no guarding and no CVA tenderness. No hernia.  Lymphadenopathy:   She has no cervical adenopathy.   She has no axillary adenopathy.  Neurological: She is alert. She has normal strength. No cranial nerve deficit or sensory deficit.  Skin: Skin is warm, dry and intact. No rash noted.  Psychiatric: Her speech is normal and behavior is normal. Judgment normal. Her mood appears not anxious. Cognition and memory are normal. She does not exhibit a depressed mood.      Diabetic foot exam: Normal inspection No skin breakdown bilateral calluses  Normal DP pulses Decreased sensation to light touch and monofilament Nails thickened.            ASSESSMENT AND PLAN:  The patient's preventative maintenance and recommended screening tests for an annual wellness exam were reviewed in full today. Brought up to date unless services declined.  Counselled on the importance of diet, exercise, and its role in overall health and mortality. The patient's FH and SH was reviewed, including their home life, tobacco status, and drug and alcohol status.   Vaccines: Due for  PNA 13 and 23, Td and shingles  Colon: not indicated  Mammo: not indicated  DVE/pap: not indicated  Former smoker, only few years history. DEXA:  Due, refused despite risk. Will continue ca and vit D.

## 2015-02-20 NOTE — Assessment & Plan Note (Signed)
LDL not at goal > SE to fenofibrate, statins and welchol.. wil try red yeast rice. Work on [employer].

## 2015-02-20 NOTE — Assessment & Plan Note (Signed)
On ACE 

## 2015-02-20 NOTE — Assessment & Plan Note (Signed)
Improved control now that she is taking her meds carefully and daughter involved.

## 2015-02-20 NOTE — Assessment & Plan Note (Signed)
Stable EKG, pt refuses further work up.  Past stress test low risk in 2014.  Likely due to GERD.

## 2015-02-20 NOTE — Patient Instructions (Addendum)
Call if any chest pain and pressure or shortness of breath returning especially with exertion.  Follow BP at home and call  with BP measurement in 1-2 weeks. Goal < 140/90. Start red yeast rice 2 cap of 600 mg twice daily.  Can try colace or miralax  For constipation.  Increase fiber and water in diet.  Look into shingles and Tdap coverage.

## 2015-02-20 NOTE — Addendum Note (Signed)
Addended by: Carter Kitten on: 02/20/2015 11:11 AM   Modules accepted: Orders

## 2015-02-20 NOTE — Assessment & Plan Note (Signed)
Follow at home. I BP remains elevated will increase lisinopril.

## 2015-02-20 NOTE — Assessment & Plan Note (Signed)
Recommended increase fiber and water in diet. Can use miralax or colace as needed for BM.

## 2015-02-20 NOTE — Assessment & Plan Note (Signed)
Moderate control on omeprazole. Likely cause of recent chest pressure.

## 2015-02-20 NOTE — Progress Notes (Signed)
Pre visit review using our clinic review tool, if applicable. No additional management support is needed unless otherwise documented below in the visit note. 

## 2015-03-06 ENCOUNTER — Other Ambulatory Visit: Payer: Self-pay | Admitting: Family Medicine

## 2015-08-17 ENCOUNTER — Other Ambulatory Visit (INDEPENDENT_AMBULATORY_CARE_PROVIDER_SITE_OTHER): Payer: PPO

## 2015-08-17 ENCOUNTER — Telehealth: Payer: Self-pay | Admitting: Family Medicine

## 2015-08-17 DIAGNOSIS — E1349 Other specified diabetes mellitus with other diabetic neurological complication: Secondary | ICD-10-CM

## 2015-08-17 DIAGNOSIS — R809 Proteinuria, unspecified: Principal | ICD-10-CM

## 2015-08-17 DIAGNOSIS — E1129 Type 2 diabetes mellitus with other diabetic kidney complication: Secondary | ICD-10-CM

## 2015-08-17 LAB — LDL CHOLESTEROL, DIRECT: Direct LDL: 97 mg/dL

## 2015-08-17 LAB — COMPREHENSIVE METABOLIC PANEL
ALBUMIN: 3.9 g/dL (ref 3.5–5.2)
ALT: 12 U/L (ref 0–35)
AST: 16 U/L (ref 0–37)
Alkaline Phosphatase: 113 U/L (ref 39–117)
BILIRUBIN TOTAL: 0.4 mg/dL (ref 0.2–1.2)
BUN: 15 mg/dL (ref 6–23)
CALCIUM: 9.8 mg/dL (ref 8.4–10.5)
CO2: 28 meq/L (ref 19–32)
Chloride: 100 mEq/L (ref 96–112)
Creatinine, Ser: 0.8 mg/dL (ref 0.40–1.20)
GFR: 72.72 mL/min (ref >60.00)
Glucose, Bld: 139 mg/dL — ABNORMAL HIGH (ref 70–99)
Potassium: 4.5 mEq/L (ref 3.5–5.1)
Sodium: 135 mEq/L (ref 135–145)
Total Protein: 7.7 g/dL (ref 6.0–8.3)

## 2015-08-17 LAB — LIPID PANEL
CHOLESTEROL: 237 mg/dL — AB (ref 0–200)
HDL: 38.2 mg/dL — AB (ref >39.00)
NONHDL: 198.9
TRIGLYCERIDES: 383 mg/dL — AB (ref 0.0–149.0)
Total CHOL/HDL Ratio: 6
VLDL: 76.6 mg/dL — ABNORMAL HIGH (ref 0.0–40.0)

## 2015-08-17 LAB — HEMOGLOBIN A1C: Hgb A1c MFr Bld: 6.3 % (ref 4.6–6.5)

## 2015-08-17 NOTE — Telephone Encounter (Signed)
-----   Message from Ellamae Sia sent at 08/10/2015 10:58 AM EDT ----- Regarding: Lab orders for Monday, 5.8.17 Lab orders for a 6 month follow up appt

## 2015-08-28 ENCOUNTER — Ambulatory Visit (INDEPENDENT_AMBULATORY_CARE_PROVIDER_SITE_OTHER): Payer: PPO | Admitting: Family Medicine

## 2015-08-28 ENCOUNTER — Encounter: Payer: Self-pay | Admitting: Family Medicine

## 2015-08-28 VITALS — BP 149/84 | HR 73 | Temp 98.3°F | Ht 63.0 in | Wt 146.5 lb

## 2015-08-28 DIAGNOSIS — E1349 Other specified diabetes mellitus with other diabetic neurological complication: Secondary | ICD-10-CM

## 2015-08-28 DIAGNOSIS — E1159 Type 2 diabetes mellitus with other circulatory complications: Secondary | ICD-10-CM

## 2015-08-28 DIAGNOSIS — E785 Hyperlipidemia, unspecified: Secondary | ICD-10-CM

## 2015-08-28 DIAGNOSIS — I152 Hypertension secondary to endocrine disorders: Secondary | ICD-10-CM

## 2015-08-28 DIAGNOSIS — R011 Cardiac murmur, unspecified: Secondary | ICD-10-CM | POA: Diagnosis not present

## 2015-08-28 DIAGNOSIS — I10 Essential (primary) hypertension: Secondary | ICD-10-CM

## 2015-08-28 LAB — HM DIABETES FOOT EXAM

## 2015-08-28 MED ORDER — LISINOPRIL 5 MG PO TABS: 5.0000 mg | ORAL_TABLET | Freq: Every day | ORAL | Status: DC

## 2015-08-28 NOTE — Assessment & Plan Note (Signed)
LDL improved with diet.. Not at goal < 70 in light of PVD. Pt refuses med to treat.

## 2015-08-28 NOTE — Progress Notes (Signed)
Pre visit review using our clinic review tool, if applicable. No additional management support is needed unless otherwise documented below in the visit note. 

## 2015-08-28 NOTE — Assessment & Plan Note (Signed)
Improved control on current regimen  

## 2015-08-28 NOTE — Assessment & Plan Note (Signed)
?   New in last 6-9 months? Eval with ECHO

## 2015-08-28 NOTE — Patient Instructions (Addendum)
Work on exercise as tolerate. Work on low carb and low cholesterol diet.  Increase lisinopril to 5 mg daily.  Check BP every few days to make sure at goal < 140/90.  Stop at front desk to set up ECHO.

## 2015-08-28 NOTE — Assessment & Plan Note (Signed)
Inadequate control. Increase lisinopril to 5 mg daily.

## 2015-08-28 NOTE — Progress Notes (Signed)
82 year old female presents for 6 month DM follow up.  Diabetes: She is using insulin NPH 15 at bedtime and regular 10 units prior to each meal. She was placed on insulin given she felt so bad on pills. Lab Results  Component Value Date   HGBA1C 6.3 08/17/2015  Using medications without difficulties: none Hypoglycemic episodes: None Hyperglycemic episodes: occ if eating carb Feet problems: Has neuropathy Blood Sugars averaging: FBS 120-154, 2 hours post prandial 200 eye exam within last year: yes. 01/2015 Trying to eat low carb diet, living at sons now.  Elevated Cholesterol: Not at goal but much lower than last check  (127-down to 97). She is on no med, her goal is LDL < 70 given her PAD.  Unable to take welchol... Caused weakness  She refuses statin. Lab Results  Component Value Date   CHOL 237* 08/17/2015   HDL 38.20* 08/17/2015   LDLCALC 79 04/13/2007   LDLDIRECT 97.0 08/17/2015   TRIG 383.0* 08/17/2015   CHOLHDL 6 08/17/2015  Using medications without problems: Statins and fenofibrate cause SE in past.  Muscle aches:  Diet compliance:moderate Exercise: minimal Other complaints:  Not checking BP lately but has previously been too high, occ > 140/90 at hme. BP Readings from Last 3 Encounters:  08/28/15 149/84  02/20/15 145/99  11/04/14 140/70  No chest pain.  Microalbuminuria: on low dose lisinopril.  New systolic murmer on exam noted 11 and now.. No significant valve issues on ECHO in 2014. No chest pain. She is tired, but not new, no change change in breathing other than from pollen.  Reviewed EKG done in 02/2015  Social History /Family History/Past Medical History reviewed and updated if needed.   Review of Systems  Constitutional: Positive for fatigue. Negative for fever.  HENT: Negative for ear pain.  Eyes: Negative for pain.  Respiratory: Negative for chest tightness and shortness of breath.  Cardiovascular: Negative for chest pain,  palpitations and leg swelling.  Gastrointestinal: Negative for abdominal pain. Constipation Genitourinary: Negative for dysuria.      Objective:  Physical Exam  Constitutional: Vital signs are normal. She appears well-developed and well-nourished. She is cooperative. Non-toxic appearance. She does not appear ill. No distress.  Elderly female  HENT:  Head: Normocephalic.  Right Ear: Hearing, tympanic membrane, external ear and ear canal normal.  Left Ear: Hearing, tympanic membrane, external ear and ear canal normal.  Nose: Nose normal.  Eyes: Conjunctivae, EOM and lids are normal. Pupils are equal, round, and reactive to light. Lids are everted and swept, no foreign bodies found.  Neck: Trachea normal and normal range of motion. Neck supple. Carotid bruit is not present. No thyroid mass and no thyromegaly present.  Cardiovascular: Normal rate, regular rhythm, S1 normal, S2 normal, systolic murmur grade 2 and intact distal pulses. Exam reveals no gallop.  No murmur heard. Pulmonary/Chest: Effort normal and breath sounds normal. No respiratory distress. She has no wheezes. She has no rhonchi. She has no rales.  Abdominal: Soft. Normal appearance and bowel sounds are normal. She exhibits no distension, no fluid wave, no abdominal bruit and no mass. There is no hepatosplenomegaly. There is no tenderness. There is no rebound, no guarding and no CVA tenderness. No hernia.  Lymphadenopathy:   She has no cervical adenopathy.   She has no axillary adenopathy.  Neurological: She is alert. She has normal strength. No cranial nerve deficit or sensory deficit.  Skin: Skin is warm, dry and intact. No rash noted.  Psychiatric:  Her speech is normal and behavior is normal. Judgment normal. Her mood appears not anxious. Cognition and memory are normal. She does not exhibit a depressed mood.      Diabetic foot exam: Normal inspection No skin breakdown bilateral calluses  Normal  DP pulses Decreased sensation to light touch and monofilament Nails thickened.

## 2015-09-14 ENCOUNTER — Ambulatory Visit (HOSPITAL_COMMUNITY): Payer: PPO | Attending: Cardiology

## 2015-09-14 ENCOUNTER — Other Ambulatory Visit: Payer: Self-pay

## 2015-09-14 DIAGNOSIS — I119 Hypertensive heart disease without heart failure: Secondary | ICD-10-CM | POA: Diagnosis not present

## 2015-09-14 DIAGNOSIS — R011 Cardiac murmur, unspecified: Secondary | ICD-10-CM | POA: Insufficient documentation

## 2015-09-14 DIAGNOSIS — I77810 Thoracic aortic ectasia: Secondary | ICD-10-CM | POA: Insufficient documentation

## 2015-09-14 DIAGNOSIS — E785 Hyperlipidemia, unspecified: Secondary | ICD-10-CM | POA: Insufficient documentation

## 2015-09-14 DIAGNOSIS — I352 Nonrheumatic aortic (valve) stenosis with insufficiency: Secondary | ICD-10-CM | POA: Diagnosis not present

## 2015-09-14 DIAGNOSIS — I340 Nonrheumatic mitral (valve) insufficiency: Secondary | ICD-10-CM | POA: Diagnosis not present

## 2015-09-14 DIAGNOSIS — E119 Type 2 diabetes mellitus without complications: Secondary | ICD-10-CM | POA: Insufficient documentation

## 2015-09-14 LAB — ECHOCARDIOGRAM COMPLETE
AO mean calculated velocity dopler: 218 cm/s
AOASC: 39 cm
AOPV: 0.32 m/s
AV Area VTI index: 0.63 cm2/m2
AV Area VTI: 1 cm2
AV Area mean vel: 0.95 cm2
AV Mean grad: 22 mmHg
AV VEL mean LVOT/AV: 0.3
AV area mean vel ind: 0.55 cm2/m2
AV peak Index: 0.58
AV vel: 1.1
AVA: 1.1 cm2
AVPG: 41 mmHg
AVPKVEL: 319 cm/s
CHL CUP DOP CALC LVOT VTI: 24.1 cm
CHL CUP MV DEC (S): 296
CHL CUP RV SYS PRESS: 25 mmHg
E decel time: 296 msec
E/e' ratio: 10.07
FS: 35 % (ref 28–44)
IVS/LV PW RATIO, ED: 1.51
LA ID, A-P, ES: 39 cm
LA vol index: 28.3 mL/m2
LADIAMINDEX: 2.25 cm/m2
LAVOL: 49 cm3
LAVOLA4C: 52 mL
LDCA: 3.14 cm2
LEFT ATRIUM END SYS DIAM: 39 cm
LV E/e' medial: 10.07
LV E/e'average: 10.07
LV PW d: 10.6 mm — AB (ref 0.6–1.1)
LV SIMPSON'S DISK: 56
LV TDI E'MEDIAL: 4.48
LV dias vol index: 28 mL/m2
LV dias vol: 48 mL (ref 46–106)
LV sys vol index: 12 mL/m2
LVELAT: 8.58 cm/s
LVOT SV: 76 cm3
LVOT diameter: 20 mm
LVOT peak grad rest: 4 mmHg
LVOTPV: 102 cm/s
LVOTVTI: 0.35 cm
LVSYSVOL: 21 mL (ref 14–42)
Lateral S' vel: 12 cm/s
MV Peak grad: 3 mmHg
MVPKAVEL: 130 m/s
MVPKEVEL: 86.4 m/s
P 1/2 time: 392 ms
Reg peak vel: 235 cm/s
Stroke v: 27 ml
TDI e' lateral: 8.58
TRMAXVEL: 235 m/s
VTI: 68.8 cm
Valve area index: 0.63

## 2015-10-02 ENCOUNTER — Other Ambulatory Visit: Payer: Self-pay | Admitting: Orthopedic Surgery

## 2015-10-02 DIAGNOSIS — S92354A Nondisplaced fracture of fifth metatarsal bone, right foot, initial encounter for closed fracture: Secondary | ICD-10-CM | POA: Diagnosis not present

## 2015-10-02 DIAGNOSIS — S42294A Other nondisplaced fracture of upper end of right humerus, initial encounter for closed fracture: Secondary | ICD-10-CM | POA: Diagnosis not present

## 2015-10-02 DIAGNOSIS — S42301A Unspecified fracture of shaft of humerus, right arm, initial encounter for closed fracture: Secondary | ICD-10-CM

## 2015-10-07 ENCOUNTER — Telehealth: Payer: Self-pay | Admitting: Family Medicine

## 2015-10-07 NOTE — Telephone Encounter (Signed)
Patient's daughter called for patient to get her Echo results.  Please call patient with results.

## 2015-10-08 NOTE — Telephone Encounter (Signed)
Notify pt 's daughter heart squeeze is good, no significant valve issues.No further eval needed.

## 2015-10-08 NOTE — Telephone Encounter (Signed)
Ms. Nickoloff notified as instructed by telephone.  She wanted to let Dr. Diona Browner know that she feel last Wednesday 6/21 and broke her right arm.

## 2015-10-09 ENCOUNTER — Ambulatory Visit
Admission: RE | Admit: 2015-10-09 | Discharge: 2015-10-09 | Disposition: A | Discharge status: Home / Self Care | Payer: PPO | Source: Ambulatory Visit | Attending: Orthopedic Surgery | Admitting: Orthopedic Surgery

## 2015-10-09 DIAGNOSIS — S42211A Unspecified displaced fracture of surgical neck of right humerus, initial encounter for closed fracture: Secondary | ICD-10-CM | POA: Diagnosis not present

## 2015-10-09 DIAGNOSIS — S42301A Unspecified fracture of shaft of humerus, right arm, initial encounter for closed fracture: Secondary | ICD-10-CM

## 2015-10-16 DIAGNOSIS — S42294D Other nondisplaced fracture of upper end of right humerus, subsequent encounter for fracture with routine healing: Secondary | ICD-10-CM | POA: Diagnosis not present

## 2015-11-03 ENCOUNTER — Encounter: Payer: Self-pay | Admitting: Family Medicine

## 2015-11-03 ENCOUNTER — Ambulatory Visit (INDEPENDENT_AMBULATORY_CARE_PROVIDER_SITE_OTHER): Payer: PPO | Admitting: Family Medicine

## 2015-11-03 VITALS — BP 135/70 | HR 80 | Temp 98.1°F | Ht 65.0 in | Wt 145.2 lb

## 2015-11-03 DIAGNOSIS — I7092 Chronic total occlusion of artery of the extremities: Secondary | ICD-10-CM

## 2015-11-03 DIAGNOSIS — I739 Peripheral vascular disease, unspecified: Secondary | ICD-10-CM | POA: Diagnosis not present

## 2015-11-03 DIAGNOSIS — S42301S Unspecified fracture of shaft of humerus, right arm, sequela: Secondary | ICD-10-CM

## 2015-11-03 DIAGNOSIS — S42301A Unspecified fracture of shaft of humerus, right arm, initial encounter for closed fracture: Secondary | ICD-10-CM | POA: Insufficient documentation

## 2015-11-03 DIAGNOSIS — E1142 Type 2 diabetes mellitus with diabetic polyneuropathy: Secondary | ICD-10-CM

## 2015-11-03 DIAGNOSIS — R296 Repeated falls: Secondary | ICD-10-CM

## 2015-11-03 NOTE — Progress Notes (Signed)
Subjective:    Patient ID: [name], female    DOB: 03-27-1930, 82 y.o.   MRN: [id number]  HPI  82 year old female presents for face to face appointment to determine if wheelchair is needed. She has been using her grandson's wheelchair. She is unable to walk more than 10-15 feet without assistance or   On June 21 she fell and broke her right arm and right foot. Tripped,  4 prong cane did not help catch her. She continues to be unsteady on feet. Having the post op shoe  On right foot even further makes her off balance. She needs to wear it for 6-8 weeks. Arm in sling for 8 months. She is unable to wheel herself forward given her right arm is in sling with the humeral fracture.   Followed by Dr. Mardelle Matte  Health issue contributing to poor mobility: She has DM with neuropathy in feet, burning in feet.  She also has severe PAD in bilateral legs, this causes pain in legs as well, claudication with walking. She is not eligible for stents or bypass.  Social History /Family History/Past Medical History reviewed and updated if needed.   Review of Systems  Constitutional: Positive for fatigue. Negative for fever.  Respiratory: Negative for cough and shortness of breath.   Cardiovascular: Negative for chest pain and leg swelling.  Musculoskeletal: Positive for gait problem.       Leg pain, burning in feet  Neurological: Positive for weakness.       Objective:   Physical Exam  Constitutional: She is oriented to person, place, and time. Vital signs are normal. She appears well-developed and well-nourished. She is cooperative.  Non-toxic appearance. She does not appear ill. No distress.  Elderly female  HENT:  Head: Normocephalic.  Right Ear: Hearing, tympanic membrane, external ear and ear canal normal.  Left Ear: Hearing, tympanic membrane, external ear and ear canal normal.  Nose: Nose normal.  Eyes: Conjunctivae, EOM and lids are normal. Pupils are equal, round, and reactive  to light. Lids are everted and swept, no foreign bodies found.  Neck: Trachea normal and normal range of motion. Neck supple. Carotid bruit is not present. No thyroid mass and no thyromegaly present.  Cardiovascular: Normal rate, regular rhythm, S1 normal, S2 normal, normal heart sounds and intact distal pulses.  Exam reveals no gallop.   Pulses:      Dorsalis pedis pulses are 0 on the right side, and 0 on the left side.       Posterior tibial pulses are 0 on the right side, and 0 on the left side.  Pulmonary/Chest: Effort normal and breath sounds normal. No respiratory distress. She has no wheezes. She has no rhonchi. She has no rales.  Abdominal: Soft. Normal appearance and bowel sounds are normal. She exhibits no distension, no fluid wave, no abdominal bruit and no mass. There is no hepatosplenomegaly. There is no tenderness. There is no rebound, no guarding and no CVA tenderness. No hernia.  Musculoskeletal:       Right shoulder: She exhibits tenderness and bony tenderness.  Right arm in sligh, right foot in post op shoe  Lymphadenopathy:    She has no cervical adenopathy.    She has no axillary adenopathy.  Neurological: She is alert and oriented to person, place, and time. She has normal strength. She displays atrophy. A sensory deficit is present. No cranial nerve deficit. She exhibits abnormal muscle tone. Coordination and gait abnormal.   4/5  strength throughout upper and lower extremities (unable to assess right shoulder /arm given fracture of humerus as well as rigfht foot given foot fracture)  Skin: Skin is warm, dry and intact. No rash noted.  Psychiatric: Her speech is normal and behavior is normal. Judgment normal. Her mood appears not anxious. Cognition and memory are normal. She does not exhibit a depressed mood.          Assessment & Plan:   Decreased Mobility, frequent falls, poor balance/pain due to  DM neuropathy and weakness/pain in legs from PAD claudication. These  issues make it necessary for pt to have additional support for mobility. Once she is able to use right arm she may be able to use a walker. Currently she requires assistance of a transport chair. She cannot walk more than 10-15 feet with out pain in legs and weakness causing her to fall or need to sit. Order for  DME transport chair sent to  [facility].  Total visit time 30 minutes, > 50% spent counseling and cordinating patients care.

## 2015-11-03 NOTE — Progress Notes (Signed)
Pre visit review using our clinic review tool, if applicable. No additional management support is needed unless otherwise documented below in the visit note. 

## 2015-11-11 DIAGNOSIS — S42294D Other nondisplaced fracture of upper end of right humerus, subsequent encounter for fracture with routine healing: Secondary | ICD-10-CM | POA: Diagnosis not present

## 2015-11-11 DIAGNOSIS — S92354D Nondisplaced fracture of fifth metatarsal bone, right foot, subsequent encounter for fracture with routine healing: Secondary | ICD-10-CM | POA: Diagnosis not present

## 2015-12-09 DIAGNOSIS — S42294D Other nondisplaced fracture of upper end of right humerus, subsequent encounter for fracture with routine healing: Secondary | ICD-10-CM | POA: Diagnosis not present

## 2015-12-09 DIAGNOSIS — M25571 Pain in right ankle and joints of right foot: Secondary | ICD-10-CM | POA: Diagnosis not present

## 2015-12-15 ENCOUNTER — Other Ambulatory Visit: Payer: Self-pay | Admitting: Family Medicine

## 2015-12-15 DIAGNOSIS — IMO0002 Reserved for concepts with insufficient information to code with codable children: Secondary | ICD-10-CM

## 2015-12-15 DIAGNOSIS — E1122 Type 2 diabetes mellitus with diabetic chronic kidney disease: Secondary | ICD-10-CM

## 2015-12-15 DIAGNOSIS — E1165 Type 2 diabetes mellitus with hyperglycemia: Principal | ICD-10-CM

## 2016-01-06 DIAGNOSIS — S42294D Other nondisplaced fracture of upper end of right humerus, subsequent encounter for fracture with routine healing: Secondary | ICD-10-CM | POA: Diagnosis not present

## 2016-01-06 DIAGNOSIS — S92354D Nondisplaced fracture of fifth metatarsal bone, right foot, subsequent encounter for fracture with routine healing: Secondary | ICD-10-CM | POA: Diagnosis not present

## 2016-01-26 DIAGNOSIS — Z961 Presence of intraocular lens: Secondary | ICD-10-CM | POA: Diagnosis not present

## 2016-01-26 DIAGNOSIS — H01021 Squamous blepharitis right upper eyelid: Secondary | ICD-10-CM | POA: Diagnosis not present

## 2016-01-26 DIAGNOSIS — B0051 Herpesviral iridocyclitis: Secondary | ICD-10-CM | POA: Diagnosis not present

## 2016-01-26 DIAGNOSIS — B0059 Other herpesviral disease of eye: Secondary | ICD-10-CM | POA: Diagnosis not present

## 2016-01-26 DIAGNOSIS — H01025 Squamous blepharitis left lower eyelid: Secondary | ICD-10-CM | POA: Diagnosis not present

## 2016-01-26 DIAGNOSIS — H01022 Squamous blepharitis right lower eyelid: Secondary | ICD-10-CM | POA: Diagnosis not present

## 2016-01-26 DIAGNOSIS — H01024 Squamous blepharitis left upper eyelid: Secondary | ICD-10-CM | POA: Diagnosis not present

## 2016-01-26 DIAGNOSIS — C44311 Basal cell carcinoma of skin of nose: Secondary | ICD-10-CM | POA: Diagnosis not present

## 2016-01-26 LAB — HM DIABETES EYE EXAM

## 2016-02-03 DIAGNOSIS — S92354D Nondisplaced fracture of fifth metatarsal bone, right foot, subsequent encounter for fracture with routine healing: Secondary | ICD-10-CM | POA: Diagnosis not present

## 2016-02-03 DIAGNOSIS — S42294D Other nondisplaced fracture of upper end of right humerus, subsequent encounter for fracture with routine healing: Secondary | ICD-10-CM | POA: Diagnosis not present

## 2016-02-09 DIAGNOSIS — D485 Neoplasm of uncertain behavior of skin: Secondary | ICD-10-CM | POA: Diagnosis not present

## 2016-02-09 DIAGNOSIS — C44311 Basal cell carcinoma of skin of nose: Secondary | ICD-10-CM | POA: Diagnosis not present

## 2016-02-16 ENCOUNTER — Telehealth: Payer: Self-pay | Admitting: Family Medicine

## 2016-02-16 DIAGNOSIS — E1349 Other specified diabetes mellitus with other diabetic neurological complication: Secondary | ICD-10-CM

## 2016-02-16 DIAGNOSIS — E782 Mixed hyperlipidemia: Secondary | ICD-10-CM

## 2016-02-16 NOTE — Telephone Encounter (Signed)
-----   Message from Marchia Bond sent at 02/12/2016  3:32 PM EDT ----- Regarding: Cpx labs Mon 11/13 need orders. Thanks! :-) Please order  future cpx labs for pt's upcoming lab appt. Thanks Aniceto Boss

## 2016-02-23 ENCOUNTER — Ambulatory Visit: Payer: PPO

## 2016-02-23 ENCOUNTER — Other Ambulatory Visit: Payer: PPO

## 2016-02-23 DIAGNOSIS — C44119 Basal cell carcinoma of skin of left eyelid, including canthus: Secondary | ICD-10-CM | POA: Diagnosis not present

## 2016-02-25 ENCOUNTER — Encounter: Payer: PPO | Admitting: Family Medicine

## 2016-02-26 ENCOUNTER — Encounter: Payer: Self-pay | Admitting: Family Medicine

## 2016-03-07 ENCOUNTER — Telehealth: Payer: Self-pay | Admitting: Family Medicine

## 2016-03-07 NOTE — Telephone Encounter (Signed)
Left message asking pt to call office she needs to r/s her cpx appointment.  Try to  get her in before 03/30/16

## 2016-04-11 HISTORY — PX: BASAL CELL CARCINOMA EXCISION: SHX1214

## 2016-05-07 DIAGNOSIS — S42301A Unspecified fracture of shaft of humerus, right arm, initial encounter for closed fracture: Secondary | ICD-10-CM | POA: Diagnosis not present

## 2016-05-07 DIAGNOSIS — R296 Repeated falls: Secondary | ICD-10-CM | POA: Diagnosis not present

## 2016-06-07 DIAGNOSIS — R296 Repeated falls: Secondary | ICD-10-CM | POA: Diagnosis not present

## 2016-06-07 DIAGNOSIS — S42301A Unspecified fracture of shaft of humerus, right arm, initial encounter for closed fracture: Secondary | ICD-10-CM | POA: Diagnosis not present

## 2016-06-13 ENCOUNTER — Ambulatory Visit: Payer: PPO

## 2016-06-23 ENCOUNTER — Ambulatory Visit (INDEPENDENT_AMBULATORY_CARE_PROVIDER_SITE_OTHER): Payer: PPO

## 2016-06-23 VITALS — BP 128/72 | HR 71 | Temp 97.8°F | Ht 63.5 in | Wt 148.0 lb

## 2016-06-23 DIAGNOSIS — E782 Mixed hyperlipidemia: Secondary | ICD-10-CM

## 2016-06-23 DIAGNOSIS — E7849 Other hyperlipidemia: Secondary | ICD-10-CM

## 2016-06-23 DIAGNOSIS — E1349 Other specified diabetes mellitus with other diabetic neurological complication: Secondary | ICD-10-CM

## 2016-06-23 DIAGNOSIS — Z0000 Encounter for general adult medical examination without abnormal findings: Secondary | ICD-10-CM

## 2016-06-23 DIAGNOSIS — E784 Other hyperlipidemia: Secondary | ICD-10-CM

## 2016-06-23 LAB — COMPREHENSIVE METABOLIC PANEL
ALK PHOS: 101 U/L (ref 39–117)
ALT: 12 U/L (ref 0–35)
AST: 15 U/L (ref 0–37)
Albumin: 4 g/dL (ref 3.5–5.2)
BUN: 23 mg/dL (ref 6–23)
CO2: 26 mEq/L (ref 19–32)
Calcium: 9.8 mg/dL (ref 8.4–10.5)
Chloride: 101 mEq/L (ref 96–112)
Creatinine, Ser: 0.89 mg/dL (ref 0.40–1.20)
GFR: 64.17 mL/min (ref >60.00)
GLUCOSE: 128 mg/dL — AB (ref 70–99)
POTASSIUM: 5 meq/L (ref 3.5–5.1)
Sodium: 136 mEq/L (ref 135–145)
TOTAL PROTEIN: 7.7 g/dL (ref 6.0–8.3)
Total Bilirubin: 0.4 mg/dL (ref 0.2–1.2)

## 2016-06-23 LAB — LIPID PANEL
Cholesterol: 248 mg/dL — ABNORMAL HIGH (ref 0–200)
HDL: 38.4 mg/dL — ABNORMAL LOW (ref >39.00)
Total CHOL/HDL Ratio: 6

## 2016-06-23 LAB — LDL CHOLESTEROL, DIRECT: Direct LDL: 101 mg/dL

## 2016-06-23 LAB — HEMOGLOBIN A1C: Hgb A1c MFr Bld: 6.4 % (ref 4.6–6.5)

## 2016-06-23 NOTE — Progress Notes (Signed)
PCP notes:   Health maintenance:  PPSV23 - addressed; pt requested to take vaccine at CPE Flu vaccine - pt declined Bone density - pt declined Tetanus - pt declined A1C - completed  Abnormal screenings:   Hearing - failed Fall risk - hx of multiple falls   Patient concerns:   None   Nurse concerns:  None   Next PCP appt:   07/12/16 @ 1400

## 2016-06-23 NOTE — Patient Instructions (Signed)
[name] , Thank you for taking time to come for your Medicare Wellness Visit. I appreciate your ongoing commitment to your health goals. Please review the following plan we discussed and let me know if I can assist you in the future.   These are the goals we discussed: Goals    . safety          Starting 06/23/2016, I will continue to use walker in an effort to reduce risk of falls.        This is a list of the screening recommended for you and due dates:  Health Maintenance  Topic Date Due  . Pneumonia vaccines (2 of 2 - PPSV23) 07/12/2016*  . Flu Shot  06/23/2025*  . DEXA scan (bone density measurement)  06/23/2025*  . Tetanus Vaccine  06/23/2025*  . Complete foot exam   08/27/2016  . Hemoglobin A1C  12/24/2016  . Eye exam for diabetics  01/25/2017  *Topic was postponed. The date shown is not the original due date.   Preventive Care for Adults  A healthy lifestyle and preventive care can promote health and wellness. Preventive health guidelines for adults include the following key practices.  . A routine yearly physical is a good way to check with your health care provider about your health and preventive screening. It is a chance to share any concerns and updates on your health and to receive a thorough exam.  . Visit your dentist for a routine exam and preventive care every 6 months. Brush your teeth twice a day and floss once a day. Good oral hygiene prevents tooth decay and gum disease.  . The frequency of eye exams is based on your age, health, family medical history, use  of contact lenses, and other factors. Follow your health care provider's ecommendations for frequency of eye exams.  . Eat a healthy diet. Foods like vegetables, fruits, whole grains, low-fat dairy products, and lean protein foods contain the nutrients you need without too many calories. Decrease your intake of foods high in solid fats, added sugars, and salt. Eat the right amount of calories for you.  Get information about a proper diet from your health care provider, if necessary.  . Regular physical exercise is one of the most important things you can do for your health. Most adults should get at least 150 minutes of moderate-intensity exercise (any activity that increases your heart rate and causes you to sweat) each week. In addition, most adults need muscle-strengthening exercises on 2 or more days a week.  Silver Sneakers may be a benefit available to you. To determine eligibility, you may visit the website: www.silversneakers.com or contact program at 646 622 3028 Mon-Fri between 8AM-8PM.   . Maintain a healthy weight. The body mass index (BMI) is a screening tool to identify possible weight problems. It provides an estimate of body fat based on height and weight. Your health care provider can find your BMI and can help you achieve or maintain a healthy weight.   For adults 20 years and older: ? A BMI below 18.5 is considered underweight. ? A BMI of 18.5 to 24.9 is normal. ? A BMI of 25 to 29.9 is considered overweight. ? A BMI of 30 and above is considered obese.   . Maintain normal blood lipids and cholesterol levels by exercising and minimizing your intake of saturated fat. Eat a balanced diet with plenty of fruit and vegetables. Blood tests for lipids and cholesterol should begin at age 39 and be  repeated every 5 years. If your lipid or cholesterol levels are high, you are over 50, or you are at high risk for heart disease, you may need your cholesterol levels checked more frequently. Ongoing high lipid and cholesterol levels should be treated with medicines if diet and exercise are not working.  . If you smoke, find out from your health care provider how to quit. If you do not use tobacco, please do not start.  . If you choose to drink alcohol, please do not consume more than 2 drinks per day. One drink is considered to be 12 ounces (355 mL) of beer, 5 ounces (148 mL) of wine, or  1.5 ounces (44 mL) of liquor.  . If you are 13-81 years old, ask your health care provider if you should take aspirin to prevent strokes.  . Use sunscreen. Apply sunscreen liberally and repeatedly throughout the day. You should seek shade when your shadow is shorter than you. Protect yourself by wearing long sleeves, pants, a wide-brimmed hat, and sunglasses year round, whenever you are outdoors.  . Once a month, do a whole body skin exam, using a mirror to look at the skin on your back. Tell your health care provider of new moles, moles that have irregular borders, moles that are larger than a pencil eraser, or moles that have changed in shape or color.

## 2016-06-23 NOTE — Progress Notes (Signed)
I reviewed health advisor's note, was available for consultation, and agree with documentation and plan.   Signed,  Dalaysia Harms T. Royalty Domagala, MD  

## 2016-06-23 NOTE — Progress Notes (Signed)
Pre visit review using our clinic review tool, if applicable. No additional management support is needed unless otherwise documented below in the visit note. 

## 2016-06-23 NOTE — Progress Notes (Signed)
Subjective:   [name] is a 83 y.o. female who presents for Medicare Annual (Subsequent) preventive examination.  Review of Systems:  N/A Cardiac Risk Factors include: advanced age (>26men, >73 women);diabetes mellitus;hypertension;dyslipidemia     Objective:     Vitals: BP 128/72 (BP Location: Left Arm, Patient Position: Sitting, Cuff Size: Normal)   Pulse 71   Temp 97.8 F (36.6 C) (Oral)   Ht 5' 3.5" (1.613 m) Comment: shoes  Wt 148 lb (67.1 kg)   SpO2 97%   BMI 25.81 kg/m   Body mass index is 25.81 kg/m.   Tobacco History  Smoking Status  . Former Smoker  . Packs/day: 0.25  . Years: 4.00  . Types: Cigarettes  . Quit date: 03/10/1981  Smokeless Tobacco  . Never Used     Counseling given: No   Past Medical History:  Diagnosis Date  . Abdominal pain, epigastric   . Anxiety   . Corns and callosities   . Diabetes mellitus   . Diaphragmatic hernia without mention of obstruction or gangrene   . Diverticulosis of colon (without mention of hemorrhage) 2009  . GERD (gastroesophageal reflux disease)   . Hiatal hernia   . History of colon polyps 2009   Hyperplastic  . Hx of adenomatous colonic polyps 2009  . Hyperlipidemia   . IBS (irritable bowel syndrome)   . Internal hemorrhoids without mention of complication   . Rash and other nonspecific skin eruption   . Status post dilation of esophageal narrowing   . Thyroid disease   . Type II or unspecified type diabetes mellitus with neurological manifestations, not stated as uncontrolled(250.60)    Past Surgical History:  Procedure Laterality Date  . ABDOMINAL HYSTERECTOMY    . BASAL CELL CARCINOMA EXCISION  04/2016   corner of left eye  . bmd  2007  . CATARACT EXTRACTION    . ESOPHAGOGASTRODUODENOSCOPY  2007  . HERNIA REPAIR    . LAPAROSCOPIC NISSEN FUNDOPLICATION  3546  . THYROIDECTOMY, PARTIAL     Family History  Problem Relation Age of Onset  . Uterine cancer Mother   . Cancer Mother   .  Diabetes Maternal Grandfather   . Diabetes Son   . Hyperlipidemia Son   . Heart disease Paternal Grandmother   . Breast cancer Paternal Aunt   . Colon cancer Neg Hx   . Stomach cancer Neg Hx    History  Sexual Activity  . Sexual activity: No    Outpatient Encounter Prescriptions as of 06/23/2016  Medication Sig  . aspirin 81 MG tablet Take 81 mg by mouth daily.    . INS SYRINGE/NEEDLE .5CC/28G 28G X 1/2" 0.5 ML MISC Inject insulin as directed. Dx 250.00  . insulin NPH Human (HUMULIN N,NOVOLIN N) 100 UNIT/ML injection Inject 15 Units into the skin at bedtime.   . insulin regular (NOVOLIN R,HUMULIN R) 100 units/mL injection Inject 10 Units into the skin 3 (three) times daily before meals.  . Lancets (ONETOUCH ULTRASOFT) lancets Check blood sugar daily.  Marland Kitchen lisinopril (PRINIVIL,ZESTRIL) 5 MG tablet Take 1 tablet (5 mg total) by mouth daily.  . Multiple Vitamins-Calcium (VIACTIV MULTI-VITAMIN) CHEW Chew 2 each by mouth daily. Reported on 08/28/2015  . ONE TOUCH ULTRA TEST test strip USE ONE STRIP TO CHECK GLUCOSE 4 TIMES DAILY   No facility-administered encounter medications on file as of 06/23/2016.     Activities of Daily Living In your present state of health, do you have any difficulty performing  the following activities: 06/23/2016  Hearing? N  Vision? Y  Difficulty concentrating or making decisions? N  Walking or climbing stairs? Y  Dressing or bathing? N  Doing errands, shopping? N  Preparing Food and eating ? N  Using the Toilet? N  In the past six months, have you accidently leaked urine? N  Do you have problems with loss of bowel control? N  Managing your Medications? N  Managing your Finances? N  Housekeeping or managing your Housekeeping? N  Some recent data might be hidden    Patient Care Team: Jinny Sanders, MD as PCP - General (Family Medicine) Clent Jacks, MD as Consulting Physician (Ophthalmology) Janann August, MD as Referring Physician (Dermatology)      Assessment:     Hearing Screening   125Hz  250Hz  500Hz  1000Hz  2000Hz  3000Hz  4000Hz  6000Hz  8000Hz   Right ear:   0 0 0  0    Left ear:   0 0 40  0    Vision Screening Comments: Last vision exam in October 2017 with Dr. Katy Fitch   Exercise Activities and Dietary recommendations Current Exercise Habits: The patient does not participate in regular exercise at present, Exercise limited by: Other - see comments (cramping in legs)  Goals    . safety          Starting 06/23/2016, I will continue to use walker in an effort to reduce risk of falls.       Fall Risk Fall Risk  06/23/2016 02/20/2015 05/08/2013  Falls in the past year? Yes Yes Yes  Number falls in past yr: 2 or more 1 1  Injury with Fall? Yes No (No Data)   Depression Screen PHQ 2/9 Scores 06/23/2016 02/20/2015 05/08/2013  PHQ - 2 Score 0 0 0     Cognitive Function MMSE - Mini Mental State Exam 06/23/2016  Orientation to time 5  Orientation to Place 5  Registration 3  Attention/ Calculation 0  Recall 3  Language- name 2 objects 0  Language- repeat 1  Language- follow 3 step command 3  Language- read & follow direction 0  Write a sentence 0  Copy design 0  Total score 20     PLEASE NOTE: A Mini-Cog screen was completed. Maximum score is 20. A value of 0 denotes this part of Folstein MMSE was not completed or the patient failed this part of the Mini-Cog screening.   Mini-Cog Screening Orientation to Time - Max 5 pts Orientation to Place - Max 5 pts Registration - Max 3 pts Recall - Max 3 pts Language Repeat - Max 1 pts Language Follow 3 Step Command - Max 3 pts     Immunization History  Administered Date(s) Administered  . Pneumococcal Conjugate-13 02/20/2015   Screening Tests Health Maintenance  Topic Date Due  . PNA vac Low Risk Adult (2 of 2 - PPSV23) 07/12/2016 (Originally 02/20/2016)  . INFLUENZA VACCINE  06/23/2025 (Originally 11/10/2015)  . DEXA SCAN  06/23/2025 (Originally 02/21/1997)  . TETANUS/TDAP   06/23/2025 (Originally 02/22/1949)  . FOOT EXAM  08/27/2016  . HEMOGLOBIN A1C  12/24/2016  . OPHTHALMOLOGY EXAM  01/25/2017      Plan:     I have personally reviewed and addressed the Medicare Annual Wellness questionnaire and have noted the following in the patient's chart:  A. Medical and social history B. Use of alcohol, tobacco or illicit drugs  C. Current medications and supplements D. Functional ability and status E.  Nutritional status F.  Physical activity  G. Advance directives H. List of other physicians I.  Hospitalizations, surgeries, and ER visits in previous 12 months J.  [place] to include hearing, vision, cognitive, depression L. Referrals and appointments - none  In addition, I have reviewed and discussed with patient certain preventive protocols, quality metrics, and best practice recommendations. A written personalized care plan for preventive services as well as general preventive health recommendations were provided to patient.  See attached scanned questionnaire for additional information.   Signed,   Lindell Noe, MHA, BS, LPN Health Coach

## 2016-07-05 DIAGNOSIS — S42301A Unspecified fracture of shaft of humerus, right arm, initial encounter for closed fracture: Secondary | ICD-10-CM | POA: Diagnosis not present

## 2016-07-05 DIAGNOSIS — R296 Repeated falls: Secondary | ICD-10-CM | POA: Diagnosis not present

## 2016-07-12 ENCOUNTER — Ambulatory Visit (INDEPENDENT_AMBULATORY_CARE_PROVIDER_SITE_OTHER): Payer: PPO | Admitting: Family Medicine

## 2016-07-12 ENCOUNTER — Encounter: Payer: Self-pay | Admitting: Family Medicine

## 2016-07-12 VITALS — BP 110/60 | HR 72 | Temp 97.7°F | Ht 63.5 in | Wt 148.0 lb

## 2016-07-12 DIAGNOSIS — E2839 Other primary ovarian failure: Secondary | ICD-10-CM | POA: Diagnosis not present

## 2016-07-12 DIAGNOSIS — E1159 Type 2 diabetes mellitus with other circulatory complications: Secondary | ICD-10-CM

## 2016-07-12 DIAGNOSIS — R296 Repeated falls: Secondary | ICD-10-CM | POA: Diagnosis not present

## 2016-07-12 DIAGNOSIS — E782 Mixed hyperlipidemia: Secondary | ICD-10-CM

## 2016-07-12 DIAGNOSIS — J301 Allergic rhinitis due to pollen: Secondary | ICD-10-CM | POA: Diagnosis not present

## 2016-07-12 DIAGNOSIS — E1349 Other specified diabetes mellitus with other diabetic neurological complication: Secondary | ICD-10-CM | POA: Diagnosis not present

## 2016-07-12 DIAGNOSIS — R809 Proteinuria, unspecified: Secondary | ICD-10-CM

## 2016-07-12 DIAGNOSIS — E1142 Type 2 diabetes mellitus with diabetic polyneuropathy: Secondary | ICD-10-CM

## 2016-07-12 DIAGNOSIS — I739 Peripheral vascular disease, unspecified: Secondary | ICD-10-CM

## 2016-07-12 DIAGNOSIS — Z85828 Personal history of other malignant neoplasm of skin: Secondary | ICD-10-CM

## 2016-07-12 DIAGNOSIS — Z23 Encounter for immunization: Secondary | ICD-10-CM

## 2016-07-12 DIAGNOSIS — L219 Seborrheic dermatitis, unspecified: Secondary | ICD-10-CM | POA: Diagnosis not present

## 2016-07-12 DIAGNOSIS — E1129 Type 2 diabetes mellitus with other diabetic kidney complication: Secondary | ICD-10-CM | POA: Diagnosis not present

## 2016-07-12 DIAGNOSIS — Z0000 Encounter for general adult medical examination without abnormal findings: Secondary | ICD-10-CM | POA: Diagnosis not present

## 2016-07-12 DIAGNOSIS — I10 Essential (primary) hypertension: Secondary | ICD-10-CM | POA: Diagnosis not present

## 2016-07-12 DIAGNOSIS — J309 Allergic rhinitis, unspecified: Secondary | ICD-10-CM | POA: Insufficient documentation

## 2016-07-12 LAB — HM DIABETES FOOT EXAM

## 2016-07-12 MED ORDER — KETOCONAZOLE 2 % EX SHAM: 1.0000 "application " | MEDICATED_SHAMPOO | CUTANEOUS | 0 refills | Status: DC

## 2016-07-12 NOTE — Assessment & Plan Note (Signed)
Trial of antihistaimine

## 2016-07-12 NOTE — Patient Instructions (Addendum)
Please stop at the front desk to set up referral.  Start red yeast rice 600 mg 2 tabs twice daily. Work on low carb and low fat diet.  Start zyrtec (ceterizine) at bedtime.  Start prescription ketoconazole shampoo for scalp rash. Call if not improving.

## 2016-07-12 NOTE — Assessment & Plan Note (Signed)
LDL not at goal... Pt refuses statin at this time. Encouraged trial of red yeast rice. Re-eval in 6 months. Low chol diet.

## 2016-07-12 NOTE — Assessment & Plan Note (Signed)
Well controlled. Continue current medication. On ACEI  

## 2016-07-12 NOTE — Assessment & Plan Note (Signed)
Stable control on no meds. 

## 2016-07-12 NOTE — Assessment & Plan Note (Signed)
High risk for fracture.. Encouraged pt to have DEXA done.

## 2016-07-12 NOTE — Assessment & Plan Note (Signed)
Trial of ketoconazole shampoo. 

## 2016-07-12 NOTE — Progress Notes (Signed)
Subjective:    Patient ID: [name], female    DOB: Aug 20, 1929, 83 y.o.   MRN: [id number]  HPI  The patient saw [name] Musa, LPN for medicare wellness. Note reviewed in detail and important notes copied below. Health maintenance: PPSV23 - addressed; pt requested to take vaccine at CPE Flu vaccine - pt declined Bone density - pt declined Tetanus - pt declined A1C - completed  Abnormal screenings:  Hearing - failed Fall risk - hx of multiple falls    Today 07/12/16 She presents for her physical exam and review of her chronic health issues.   She has  Noted dry cough in last week. Nasal congestion. Mucinex has not helped much. No fever, no SOB, no wheeze.   She has noted rash on scalp for > 1 year. Itchy. Flaky.  Diabetes:   Good control with insulin. Using NPH at bedtime and regular insulin prior to meals. Lab Results  Component Value Date   HGBA1C 6.4 06/23/2016  Using medications without difficulties: Hypoglycemic episodes: none Hyperglycemic episodes:none Feet problems:no uclers Blood Sugars averaging: fbs 115-160 eye exam within last year: yes  on low dose lisinopril for microalbuminuria  Elevated Cholesterol:  LDL not at goal given PAD and CVD, trigs very high. Statins and fenofibrate cause SE in past.  Lab Results  Component Value Date   CHOL 248 (H) 06/23/2016   HDL 38.40 (L) 06/23/2016   LDLCALC 79 04/13/2007   LDLDIRECT 101.0 06/23/2016   TRIG (H) 06/23/2016    406.0 Triglyceride is over 400; calculations on Lipids are invalid.   CHOLHDL 6 06/23/2016  Diet compliance: Good  Exercise: walks with walker Other complaints:  PAD:  Stable, on aspirin  Hypertension:    On ACEI BP Readings from Last 3 Encounters:  07/12/16 110/60  06/23/16 128/72  11/03/15 135/70  Using medication without problems or lightheadedness:  none Chest pain with exertion: none Edema:occ.. Varicose veins. Short of breath:one Average home BPs:  Other issues:  .  Social History /Family History/Past Medical History reviewed and updated if needed. Blood pressure 110/60, pulse 72, temperature 97.7 F (36.5 C), temperature source Oral, height 5' 3.5" (1.613 m), weight 148 lb (67.1 kg).  Review of Systems  Constitutional: Negative for fatigue and fever.  HENT: Positive for congestion. Negative for ear pain.   Eyes: Negative for pain.  Respiratory: Positive for cough. Negative for chest tightness and shortness of breath.   Cardiovascular: Negative for chest pain, palpitations and leg swelling.  Gastrointestinal: Negative for abdominal pain.  Genitourinary: Negative for dysuria and vaginal bleeding.  Musculoskeletal: Negative for back pain.  Neurological: Negative for syncope, light-headedness and headaches.  Psychiatric/Behavioral: Negative for dysphoric mood.       Objective:   Physical Exam  Constitutional: She is oriented to person, place, and time. Vital signs are normal. She appears well-developed and well-nourished. She is cooperative.  Non-toxic appearance. She does not appear ill. No distress.  Elderly female  HENT:  Head: Normocephalic.  Right Ear: Hearing, tympanic membrane, external ear and ear canal normal.  Left Ear: Hearing, tympanic membrane, external ear and ear canal normal.  Nose: Nose normal.  Eyes: Conjunctivae, EOM and lids are normal. Pupils are equal, round, and reactive to light. Lids are everted and swept, no foreign bodies found.  Neck: Trachea normal and normal range of motion. Neck supple. Carotid bruit is not present. No thyroid mass and no thyromegaly present.  Cardiovascular: Normal rate, regular rhythm, S1 normal, S2 normal, normal  heart sounds and intact distal pulses.  Exam reveals no gallop.   Pulses:      Dorsalis pedis pulses are 0 on the right side, and 0 on the left side.       Posterior tibial pulses are 0 on the right side, and 0 on the left side.  Pulmonary/Chest: Effort normal and breath sounds  normal. No respiratory distress. She has no wheezes. She has no rhonchi. She has no rales.  Abdominal: Soft. Normal appearance and bowel sounds are normal. She exhibits no distension, no fluid wave, no abdominal bruit and no mass. There is no hepatosplenomegaly. There is no tenderness. There is no rebound, no guarding and no CVA tenderness. No hernia.  Musculoskeletal:       Right shoulder: She exhibits tenderness and bony tenderness.  Right arm in sligh, right foot in post op shoe  Lymphadenopathy:    She has no cervical adenopathy.    She has no axillary adenopathy.  Neurological: She is alert and oriented to person, place, and time. She has normal strength. She displays atrophy. A sensory deficit is present. No cranial nerve deficit. She exhibits abnormal muscle tone. Coordination and gait abnormal.   4/5 strength throughout upper and lower extremities (unable to assess right shoulder /arm given fracture of humerus as well as rigfht foot given foot fracture)  Skin: Skin is warm, dry and intact. No rash noted.  Psychiatric: Her speech is normal and behavior is normal. Judgment normal. Her mood appears not anxious. Cognition and memory are normal. She does not exhibit a depressed mood.   Diabetic foot exam: Normal inspection No skin breakdown No calluses  Normal DP pulses Normal sensation to light touch and monofilament Nails long, yellow and thick        Assessment & Plan:  The patient's preventative maintenance and recommended screening tests for an annual wellness exam were reviewed in full today. Brought up to date unless services declined.  Counselled on the importance of diet, exercise, and its role in overall health and mortality. The patient's FH and SH was reviewed, including their home life, tobacco status, and drug and alcohol status.   Vaccines: Due for   23,  Refused Td and shingles  Colon: not indicated  Mammo: not indicated  DVE/pap: not indicated  Former smoker,  only few years history. DEXA:  Due, refused despite risk. Will continue ca and vit D.

## 2016-07-12 NOTE — Assessment & Plan Note (Addendum)
On ACE-I 

## 2016-07-12 NOTE — Assessment & Plan Note (Signed)
Followed by Dr. Oneida Alar. On ASA.

## 2016-07-12 NOTE — Addendum Note (Signed)
Addended by: Carter Kitten on: 07/12/2016 03:56 PM   Modules accepted: Orders

## 2016-07-12 NOTE — Progress Notes (Signed)
Pre visit review using our clinic review tool, if applicable. No additional management support is needed unless otherwise documented below in the visit note. 

## 2016-07-19 ENCOUNTER — Encounter: Payer: Self-pay | Admitting: Family Medicine

## 2016-07-19 ENCOUNTER — Ambulatory Visit
Admission: RE | Admit: 2016-07-19 | Discharge: 2016-07-19 | Disposition: A | Discharge status: Home / Self Care | Payer: PPO | Source: Ambulatory Visit | Attending: Family Medicine | Admitting: Family Medicine

## 2016-07-19 DIAGNOSIS — M810 Age-related osteoporosis without current pathological fracture: Secondary | ICD-10-CM | POA: Insufficient documentation

## 2016-07-19 DIAGNOSIS — E2839 Other primary ovarian failure: Secondary | ICD-10-CM

## 2016-07-20 DIAGNOSIS — B0051 Herpesviral iridocyclitis: Secondary | ICD-10-CM | POA: Diagnosis not present

## 2016-07-20 DIAGNOSIS — B0059 Other herpesviral disease of eye: Secondary | ICD-10-CM | POA: Diagnosis not present

## 2016-07-20 DIAGNOSIS — H01021 Squamous blepharitis right upper eyelid: Secondary | ICD-10-CM | POA: Diagnosis not present

## 2016-07-20 DIAGNOSIS — Z961 Presence of intraocular lens: Secondary | ICD-10-CM | POA: Diagnosis not present

## 2016-07-20 DIAGNOSIS — H01025 Squamous blepharitis left lower eyelid: Secondary | ICD-10-CM | POA: Diagnosis not present

## 2016-07-20 DIAGNOSIS — H01022 Squamous blepharitis right lower eyelid: Secondary | ICD-10-CM | POA: Diagnosis not present

## 2016-07-20 DIAGNOSIS — C44311 Basal cell carcinoma of skin of nose: Secondary | ICD-10-CM | POA: Diagnosis not present

## 2016-07-20 DIAGNOSIS — H01024 Squamous blepharitis left upper eyelid: Secondary | ICD-10-CM | POA: Diagnosis not present

## 2016-07-22 ENCOUNTER — Ambulatory Visit (INDEPENDENT_AMBULATORY_CARE_PROVIDER_SITE_OTHER): Payer: PPO | Admitting: Family Medicine

## 2016-07-22 ENCOUNTER — Encounter: Payer: Self-pay | Admitting: Family Medicine

## 2016-07-22 VITALS — BP 108/70 | HR 75 | Temp 98.4°F | Ht 63.5 in | Wt 149.5 lb

## 2016-07-22 DIAGNOSIS — K2100 Gastro-esophageal reflux disease with esophagitis, without bleeding: Secondary | ICD-10-CM

## 2016-07-22 DIAGNOSIS — L219 Seborrheic dermatitis, unspecified: Secondary | ICD-10-CM

## 2016-07-22 DIAGNOSIS — M810 Age-related osteoporosis without current pathological fracture: Secondary | ICD-10-CM

## 2016-07-22 MED ORDER — KETOCONAZOLE 2 % EX SHAM: 1.0000 "application " | MEDICATED_SHAMPOO | CUTANEOUS | 5 refills | Status: DC

## 2016-07-22 MED ORDER — ALENDRONATE SODIUM 70 MG PO TABS
70.0000 mg | ORAL_TABLET | ORAL | 11 refills | Status: DC
Start: 2016-07-22 — End: 2017-03-17

## 2016-07-22 MED ORDER — PANTOPRAZOLE SODIUM 40 MG PO TBEC: 40.0000 mg | DELAYED_RELEASE_TABLET | Freq: Every day | ORAL | 3 refills | Status: DC

## 2016-07-22 NOTE — Assessment & Plan Note (Signed)
Discussed implications of Dx, SE of med and options of treatment. Start fosamax  Weekly x 2 years then re-eval DEXa. Recommend weight bearing exercise, calcium in diet and vit D supplement 400 IU 1-2 times daily.

## 2016-07-22 NOTE — Progress Notes (Signed)
   Subjective:    Patient ID: [name], female    DOB: 1930-03-16, 83 y.o.   MRN: [id number]  HPI   83 year old female presents for discussion of new diagnosis osteoporosis.   Recent DEXA  On 07/19/2016 showed T -3.6 in left femur neck and -3.1 in spine.   Taking calcium 1200 daily and she drinks milk, she thinks vit D is in this as well.  Cannot afford omeprazole 20 mg BID.Marland Kitchen Wishes to change to protonix 40 mg daily which is covered. Needs ever since hiatal hernia.   Shampoo has helped her seb derm. Needs refill.  Review of Systems  Constitutional: Negative for fatigue and fever.  HENT: Negative for ear pain.   Eyes: Negative for pain.  Respiratory: Negative for chest tightness and shortness of breath.   Cardiovascular: Negative for chest pain, palpitations and leg swelling.  Gastrointestinal: Negative for abdominal pain.  Genitourinary: Negative for dysuria.       Objective:   Physical Exam  Constitutional: She appears well-developed.  Cardiovascular: Normal rate.   No murmur heard. Pulmonary/Chest: Effort normal.  Abdominal: Soft. Bowel sounds are normal. There is no tenderness.  Skin:  Scalp rash improved          Assessment & Plan:

## 2016-07-22 NOTE — Patient Instructions (Addendum)
Continue ca and vit.. Goal Ca 600mg , vit 400 IU  twice daily. Walk as much as tolerated.  Start alendronate ( fosamax) weekly, 30 min to 1 hour prior to eating, Do not lay down after taking. Plan rechecking bone density in 2 years.

## 2016-07-22 NOTE — Assessment & Plan Note (Signed)
Change to pantoprazole for cost. Pt has not been able to stop PPI since hiatal hernia surgery.

## 2016-07-22 NOTE — Assessment & Plan Note (Signed)
Improved. Refill ketoconazole shampoo.

## 2016-08-05 DIAGNOSIS — R296 Repeated falls: Secondary | ICD-10-CM | POA: Diagnosis not present

## 2016-08-05 DIAGNOSIS — S42301A Unspecified fracture of shaft of humerus, right arm, initial encounter for closed fracture: Secondary | ICD-10-CM | POA: Diagnosis not present

## 2016-09-04 DIAGNOSIS — S42301A Unspecified fracture of shaft of humerus, right arm, initial encounter for closed fracture: Secondary | ICD-10-CM | POA: Diagnosis not present

## 2016-09-04 DIAGNOSIS — R296 Repeated falls: Secondary | ICD-10-CM | POA: Diagnosis not present

## 2016-09-07 DIAGNOSIS — H01022 Squamous blepharitis right lower eyelid: Secondary | ICD-10-CM | POA: Diagnosis not present

## 2016-09-07 DIAGNOSIS — H40053 Ocular hypertension, bilateral: Secondary | ICD-10-CM | POA: Diagnosis not present

## 2016-09-07 DIAGNOSIS — H01024 Squamous blepharitis left upper eyelid: Secondary | ICD-10-CM | POA: Diagnosis not present

## 2016-09-07 DIAGNOSIS — C44311 Basal cell carcinoma of skin of nose: Secondary | ICD-10-CM | POA: Diagnosis not present

## 2016-09-07 DIAGNOSIS — Z961 Presence of intraocular lens: Secondary | ICD-10-CM | POA: Diagnosis not present

## 2016-09-07 DIAGNOSIS — B0059 Other herpesviral disease of eye: Secondary | ICD-10-CM | POA: Diagnosis not present

## 2016-09-07 DIAGNOSIS — B0051 Herpesviral iridocyclitis: Secondary | ICD-10-CM | POA: Diagnosis not present

## 2016-09-07 DIAGNOSIS — H01025 Squamous blepharitis left lower eyelid: Secondary | ICD-10-CM | POA: Diagnosis not present

## 2016-09-07 DIAGNOSIS — H01021 Squamous blepharitis right upper eyelid: Secondary | ICD-10-CM | POA: Diagnosis not present

## 2016-09-13 ENCOUNTER — Other Ambulatory Visit: Payer: Self-pay | Admitting: Family Medicine

## 2016-09-26 ENCOUNTER — Other Ambulatory Visit: Payer: Self-pay | Admitting: Family Medicine

## 2016-09-26 ENCOUNTER — Other Ambulatory Visit: Payer: Self-pay | Admitting: *Deleted

## 2016-09-26 ENCOUNTER — Telehealth: Payer: Self-pay

## 2016-09-26 DIAGNOSIS — IMO0002 Reserved for concepts with insufficient information to code with codable children: Secondary | ICD-10-CM

## 2016-09-26 DIAGNOSIS — E1122 Type 2 diabetes mellitus with diabetic chronic kidney disease: Secondary | ICD-10-CM

## 2016-09-26 DIAGNOSIS — E1165 Type 2 diabetes mellitus with hyperglycemia: Principal | ICD-10-CM

## 2016-09-26 MED ORDER — INSULIN ASPART 100 UNIT/ML FLEXPEN: 10.0000 [IU] | PEN_INJECTOR | Freq: Three times a day (TID) | SUBCUTANEOUS | 5 refills | Status: DC

## 2016-09-26 NOTE — Telephone Encounter (Signed)
Patient calls and states she cannot afford a 50 day supply on the novolog flexpen as it is $90.00.  But if we can send in a new order for a 30 day supply with refills that will be a little cheaper.  Patient also is planning to stop the N and the R insulin and replace with this Flex Pen insulin which she says is new to her.   I am confused over what patient is to take and not take and if we can decrease qty of insulin pen.    Please Advise.    Will send this to Dr. Arley Phenix CMA Butch Penny to review and communicate with MD taking her messages.

## 2016-09-28 MED ORDER — PEN NEEDLES 32G X 4 MM MISC: 1.0000 [IU] | Freq: Three times a day (TID) | 5 refills | Status: DC

## 2016-09-28 NOTE — Telephone Encounter (Signed)
Spoke to pt. The pharamcy could not breakdown the boxes of pens. She paid the $90 for about a 50 day supply. She needed a rx for pen needles. I sent that in.

## 2016-09-28 NOTE — Telephone Encounter (Signed)
Okay to change to a lower amount 30 day supply. The novolog flex pen is raspid acting and can replace the novolin R but she needs to continue the novolin N ( the intermediate acting).

## 2016-10-05 DIAGNOSIS — R296 Repeated falls: Secondary | ICD-10-CM | POA: Diagnosis not present

## 2016-10-05 DIAGNOSIS — S42301A Unspecified fracture of shaft of humerus, right arm, initial encounter for closed fracture: Secondary | ICD-10-CM | POA: Diagnosis not present

## 2016-10-26 ENCOUNTER — Telehealth: Payer: Self-pay

## 2016-10-26 DIAGNOSIS — R21 Rash and other nonspecific skin eruption: Secondary | ICD-10-CM

## 2016-10-26 NOTE — Telephone Encounter (Signed)
the ketoconazole shampoo is not working; her scalp is the same as when seen 07/12/16; scalp is itchy and flaky and hair is coming out when brushes hair. Pt wants to know what to do now.pt request referral to dermatologist. [facility] [place]. Pt request cb. Pt will be going on vacation next week.

## 2016-11-04 DIAGNOSIS — S42301A Unspecified fracture of shaft of humerus, right arm, initial encounter for closed fracture: Secondary | ICD-10-CM | POA: Diagnosis not present

## 2016-11-04 DIAGNOSIS — R296 Repeated falls: Secondary | ICD-10-CM | POA: Diagnosis not present

## 2016-11-07 ENCOUNTER — Telehealth: Payer: Self-pay | Admitting: *Deleted

## 2016-11-07 DIAGNOSIS — R21 Rash and other nonspecific skin eruption: Secondary | ICD-10-CM

## 2016-11-07 NOTE — Telephone Encounter (Signed)
Patient left a voicemail stating that the shampoo that you gave her is not working and she wants a referral to a dermatologist. Patient also wanted to know if you will send in for her the new free style machine where she does not have to stick herself anymore to check her blood sugar?

## 2016-11-08 NOTE — Telephone Encounter (Signed)
Freestyle meter needs to be managed by endocrinologist. I can make referral to ENDO as well.

## 2016-11-09 NOTE — Telephone Encounter (Signed)
Left message for [name] to return my call.

## 2016-11-09 NOTE — Telephone Encounter (Signed)
[name] notified as instructed by telephone.  She does not want any more doctors so she will hold off on the endocrinology referral for now.  Will await call from [facility] about the Dermatology referral.

## 2016-11-22 DIAGNOSIS — L570 Actinic keratosis: Secondary | ICD-10-CM | POA: Diagnosis not present

## 2016-11-22 DIAGNOSIS — L218 Other seborrheic dermatitis: Secondary | ICD-10-CM | POA: Diagnosis not present

## 2016-11-22 DIAGNOSIS — L920 Granuloma annulare: Secondary | ICD-10-CM | POA: Diagnosis not present

## 2016-12-05 ENCOUNTER — Telehealth: Payer: Self-pay | Admitting: Family Medicine

## 2016-12-05 ENCOUNTER — Encounter: Payer: Self-pay | Admitting: Family Medicine

## 2016-12-05 ENCOUNTER — Ambulatory Visit (INDEPENDENT_AMBULATORY_CARE_PROVIDER_SITE_OTHER): Payer: PPO | Admitting: Family Medicine

## 2016-12-05 DIAGNOSIS — R5383 Other fatigue: Secondary | ICD-10-CM

## 2016-12-05 DIAGNOSIS — R42 Dizziness and giddiness: Secondary | ICD-10-CM

## 2016-12-05 LAB — COMPREHENSIVE METABOLIC PANEL
ALK PHOS: 81 U/L (ref 39–117)
ALT: 11 U/L (ref 0–35)
AST: 14 U/L (ref 0–37)
Albumin: 3.8 g/dL (ref 3.5–5.2)
BUN: 15 mg/dL (ref 6–23)
CO2: 28 mEq/L (ref 19–32)
Calcium: 9.4 mg/dL (ref 8.4–10.5)
Chloride: 105 mEq/L (ref 96–112)
Creatinine, Ser: 0.81 mg/dL (ref 0.40–1.20)
GFR: 71.46 mL/min (ref >60.00)
GLUCOSE: 141 mg/dL — AB (ref 70–99)
POTASSIUM: 4.3 meq/L (ref 3.5–5.1)
Sodium: 139 mEq/L (ref 135–145)
TOTAL PROTEIN: 7.3 g/dL (ref 6.0–8.3)
Total Bilirubin: 0.5 mg/dL (ref 0.2–1.2)

## 2016-12-05 LAB — CBC WITH DIFFERENTIAL/PLATELET
Basophils Absolute: 0.1 10*3/uL (ref 0.0–0.1)
Basophils Relative: 0.8 % (ref 0.0–3.0)
EOS PCT: 2.7 % (ref 0.0–5.0)
Eosinophils Absolute: 0.2 10*3/uL (ref 0.0–0.7)
HEMATOCRIT: 41.9 % (ref 36.0–46.0)
HEMOGLOBIN: 13.8 g/dL (ref 12.0–15.0)
LYMPHS ABS: 2.8 10*3/uL (ref 0.7–4.0)
LYMPHS PCT: 31.2 % (ref 12.0–46.0)
MCHC: 32.9 g/dL (ref 30.0–36.0)
MCV: 80.9 fl (ref 78.0–100.0)
MONOS PCT: 8.9 % (ref 3.0–12.0)
Monocytes Absolute: 0.8 10*3/uL (ref 0.1–1.0)
Neutro Abs: 5.1 10*3/uL (ref 1.4–7.7)
Neutrophils Relative %: 56.4 % (ref 43.0–77.0)
Platelets: 238 10*3/uL (ref 150.0–400.0)
RBC: 5.18 Mil/uL — AB (ref 3.87–5.11)
RDW: 15.6 % — ABNORMAL HIGH (ref 11.5–15.5)
WBC: 9 10*3/uL (ref 4.0–10.5)

## 2016-12-05 LAB — T3, FREE: T3 FREE: 3.8 pg/mL (ref 2.3–4.2)

## 2016-12-05 LAB — TSH: TSH: 1.23 u[IU]/mL (ref 0.35–4.50)

## 2016-12-05 LAB — VITAMIN B12: Vitamin B-12: 388 pg/mL (ref 211–911)

## 2016-12-05 LAB — T4, FREE: Free T4: 1.01 ng/dL (ref 0.60–1.60)

## 2016-12-05 NOTE — Telephone Encounter (Signed)
[facility]  Patient Name: [name]  DOB: 06-Jun-1929    Initial Comment Ola Raap states her equilibrium has been off for about a week.   Nurse Assessment  Nurse: Harlow Mares, RN, Suanne Marker Date/Time (Eastern Time): 12/05/2016 9:03:41 AM  Confirm and document reason for call. If symptomatic, describe symptoms. ---Rich Reining states her equilibrium has been off for about a week. Reports that she feels like she is swaying. Her son has to help her ambulate.  Does the patient have any new or worsening symptoms? ---Yes  Will a triage be completed? ---Yes  Related visit to physician within the last 2 weeks? ---No  Does the PT have any chronic conditions? (i.e. diabetes, asthma, etc.) ---Yes  List chronic conditions. ---diabetic; HTN  Is this a behavioral health or substance abuse call? ---No     Guidelines    Guideline Title Affirmed Question Affirmed Notes  Dizziness - Lightheadedness SEVERE dizziness (e.g., unable to stand, requires support to walk, feels like passing out now)    Final Disposition User   Go to ED Now (or PCP triage) Harlow Mares, RN, Rhonda    Referrals  REFERRED TO PCP OFFICE   Disagree/Comply: Leta Baptist

## 2016-12-05 NOTE — Telephone Encounter (Signed)
Pt has appt to see Dr Diona Browner 12/05/16 at 10:30.

## 2016-12-05 NOTE — Patient Instructions (Addendum)
Start nasal steroid spray like flonase 2 sprays per nostril daily.   Please stop at the lab to have labs drawn.  Start home densensitization exercises for inner ear issue.  If not improving as expected call.

## 2016-12-05 NOTE — Progress Notes (Signed)
Subjective:    Patient ID: [name], female    DOB: March 29, 1930, 83 y.o.   MRN: [id number]  HPI   83 year old female with history of  DM, peripheral neuropathy,  HTN, PAD presents with new onset dizziness and fatigue in last 2 weeks.  She has also noted  ears are stopped up. No congestion, no allergies, no cough  She reports she feels off balance.. Feels like moving to the left. Noted all of a sudden. Feels like head spinning. Worse when getting up out of bed.  Seems to come and go.. Better some days.   She has been using the walker.  Blood pressure 164/82, on recheck it was 126/82 Blood sugar in nml range.  No new vision change, no new numbness, no new weakness, no new slurred  Speech, no confusion. No prior falls. No head injury.  no CP, no SOB.  No dysuria, no blood loss in stool and urine.  Seemed to improve with granddaughter's neomycinpolynycin drops  1 dose. Then came back.   Blood pressure 124/76, pulse 89, temperature 98.3 F (36.8 C), temperature source Oral, height 5' 3.5" (1.613 m).  Review of Systems  Constitutional: Negative for fatigue and fever.  HENT: Negative for ear pain.   Eyes: Negative for pain.  Respiratory: Negative for chest tightness and shortness of breath.   Cardiovascular: Negative for chest pain, palpitations and leg swelling.  Gastrointestinal: Negative for abdominal pain.  Genitourinary: Negative for dysuria.       Objective:   Physical Exam  Constitutional: Vital signs are normal. She appears well-developed and well-nourished. She is cooperative.  Non-toxic appearance. She does not appear ill. No distress.  In wheelchair  HENT:  Head: Normocephalic.  Right Ear: Hearing, external ear and ear canal normal. Tympanic membrane is scarred. Tympanic membrane is not erythematous, not retracted and not bulging. A middle ear effusion is present.  Left Ear: Hearing, external ear and ear canal normal. Tympanic membrane is scarred. Tympanic  membrane is not erythematous, not retracted and not bulging. A middle ear effusion is present.  Nose: No mucosal edema or rhinorrhea. Right sinus exhibits no maxillary sinus tenderness and no frontal sinus tenderness. Left sinus exhibits no maxillary sinus tenderness and no frontal sinus tenderness.  Mouth/Throat: Uvula is midline, oropharynx is clear and moist and mucous membranes are normal.  Eyes: Pupils are equal, round, and reactive to light. Conjunctivae, EOM and lids are normal. Lids are everted and swept, no foreign bodies found.  Neck: Trachea normal and normal range of motion. Neck supple. Carotid bruit is not present. No thyroid mass and no thyromegaly present.  Cardiovascular: Normal rate, regular rhythm, S1 normal, S2 normal, normal heart sounds, intact distal pulses and normal pulses.  Exam reveals no gallop and no friction rub.   No murmur heard. Pulmonary/Chest: Effort normal and breath sounds normal. No tachypnea. No respiratory distress. She has no decreased breath sounds. She has no wheezes. She has no rhonchi. She has no rales.  Abdominal: Soft. Normal appearance and bowel sounds are normal. There is no tenderness.  Neurological: She is alert. She has normal strength. She displays no atrophy and no tremor. No cranial nerve deficit or sensory deficit. She exhibits normal muscle tone. Coordination and gait abnormal.  Skin: Skin is warm, dry and intact. No rash noted.  Psychiatric: Her speech is normal and behavior is normal. Judgment and thought content normal. Her mood appears not anxious. Cognition and memory are normal. She does  not exhibit a depressed mood.          Assessment & Plan:

## 2016-12-05 NOTE — Assessment & Plan Note (Addendum)
No clear glucose issue,  Slightly positive orthostatics. Most likely inner ear issue.. But given presence of fatigue will eval labs as well.  Start nasal steroid spray for ear fullness and and ETD. Start home desensitization exercises.

## 2016-12-27 ENCOUNTER — Telehealth: Payer: Self-pay

## 2016-12-27 DIAGNOSIS — R42 Dizziness and giddiness: Secondary | ICD-10-CM

## 2016-12-27 NOTE — Telephone Encounter (Signed)
Pt left v/m; pt last seen 12/05/16. Pt requesting referral to Dr Ruta Hinds, vascular doctor to ck blood flow to see if that is causing dizziness and fatigue.

## 2016-12-27 NOTE — Telephone Encounter (Signed)
Instead of referral to vascular I would recommend simply proceeding with an [place] of her carotids. Let me know if she is agreeable.

## 2016-12-28 NOTE — Telephone Encounter (Signed)
[name] notified as instructed by telephone.  She is agreeable to doing the carotid doppler.

## 2017-01-11 ENCOUNTER — Ambulatory Visit (HOSPITAL_COMMUNITY)
Admission: RE | Admit: 2017-01-11 | Discharge: 2017-01-11 | Disposition: A | Discharge status: Home / Self Care | Payer: PPO | Source: Ambulatory Visit | Attending: Cardiovascular Disease | Admitting: Cardiovascular Disease

## 2017-01-11 DIAGNOSIS — R42 Dizziness and giddiness: Secondary | ICD-10-CM | POA: Diagnosis not present

## 2017-01-11 DIAGNOSIS — I6523 Occlusion and stenosis of bilateral carotid arteries: Secondary | ICD-10-CM | POA: Insufficient documentation

## 2017-01-12 ENCOUNTER — Encounter: Payer: Self-pay | Admitting: Family Medicine

## 2017-01-12 DIAGNOSIS — I6523 Occlusion and stenosis of bilateral carotid arteries: Secondary | ICD-10-CM | POA: Insufficient documentation

## 2017-01-15 ENCOUNTER — Other Ambulatory Visit: Payer: Self-pay | Admitting: Family Medicine

## 2017-01-15 DIAGNOSIS — IMO0002 Reserved for concepts with insufficient information to code with codable children: Secondary | ICD-10-CM

## 2017-01-15 DIAGNOSIS — E1122 Type 2 diabetes mellitus with diabetic chronic kidney disease: Secondary | ICD-10-CM

## 2017-01-15 DIAGNOSIS — E1165 Type 2 diabetes mellitus with hyperglycemia: Principal | ICD-10-CM

## 2017-01-20 ENCOUNTER — Telehealth: Payer: Self-pay

## 2017-01-20 NOTE — Telephone Encounter (Signed)
[name] (DPR signed) said that pt is very weak; has problems with steadiness around the house with pts walker. pts daughter thinks pt needs a lightweight w/c; pt has a transfer chair already. Explained to [name] pt would need face to face appt to order w/c. [name] does not want to schedule appt and will have pt cb to schedule appt. FYI to Dr Diona Browner.

## 2017-01-20 NOTE — Telephone Encounter (Signed)
Noted  

## 2017-03-01 ENCOUNTER — Inpatient Hospital Stay (HOSPITAL_COMMUNITY): Payer: PPO | Admitting: Certified Registered Nurse Anesthetist

## 2017-03-01 ENCOUNTER — Other Ambulatory Visit: Payer: Self-pay

## 2017-03-01 ENCOUNTER — Encounter (HOSPITAL_COMMUNITY)
Admission: EM | Disposition: A | Discharge status: Skilled Nursing Facility | Payer: Self-pay | Source: Home / Self Care | Attending: Internal Medicine

## 2017-03-01 ENCOUNTER — Inpatient Hospital Stay (HOSPITAL_COMMUNITY): Payer: PPO

## 2017-03-01 ENCOUNTER — Emergency Department (HOSPITAL_COMMUNITY): Payer: PPO

## 2017-03-01 ENCOUNTER — Encounter (HOSPITAL_COMMUNITY): Payer: Self-pay | Admitting: *Deleted

## 2017-03-01 ENCOUNTER — Inpatient Hospital Stay (HOSPITAL_COMMUNITY)
Admission: EM | Admit: 2017-03-01 | Discharge: 2017-03-17 | DRG: 480 | Disposition: A | Discharge status: Skilled Nursing Facility | Payer: PPO | Attending: Internal Medicine | Admitting: Internal Medicine

## 2017-03-01 DIAGNOSIS — R0600 Dyspnea, unspecified: Secondary | ICD-10-CM | POA: Diagnosis not present

## 2017-03-01 DIAGNOSIS — R54 Age-related physical debility: Secondary | ICD-10-CM | POA: Diagnosis present

## 2017-03-01 DIAGNOSIS — D72829 Elevated white blood cell count, unspecified: Secondary | ICD-10-CM | POA: Diagnosis not present

## 2017-03-01 DIAGNOSIS — N179 Acute kidney failure, unspecified: Secondary | ICD-10-CM | POA: Diagnosis not present

## 2017-03-01 DIAGNOSIS — E871 Hypo-osmolality and hyponatremia: Secondary | ICD-10-CM | POA: Diagnosis not present

## 2017-03-01 DIAGNOSIS — R0602 Shortness of breath: Secondary | ICD-10-CM | POA: Diagnosis not present

## 2017-03-01 DIAGNOSIS — R41841 Cognitive communication deficit: Secondary | ICD-10-CM | POA: Diagnosis not present

## 2017-03-01 DIAGNOSIS — Z833 Family history of diabetes mellitus: Secondary | ICD-10-CM

## 2017-03-01 DIAGNOSIS — E1151 Type 2 diabetes mellitus with diabetic peripheral angiopathy without gangrene: Secondary | ICD-10-CM | POA: Diagnosis present

## 2017-03-01 DIAGNOSIS — E1122 Type 2 diabetes mellitus with diabetic chronic kidney disease: Secondary | ICD-10-CM | POA: Diagnosis present

## 2017-03-01 DIAGNOSIS — I509 Heart failure, unspecified: Secondary | ICD-10-CM

## 2017-03-01 DIAGNOSIS — Z8249 Family history of ischemic heart disease and other diseases of the circulatory system: Secondary | ICD-10-CM

## 2017-03-01 DIAGNOSIS — W010XXA Fall on same level from slipping, tripping and stumbling without subsequent striking against object, initial encounter: Secondary | ICD-10-CM | POA: Diagnosis present

## 2017-03-01 DIAGNOSIS — S6991XA Unspecified injury of right wrist, hand and finger(s), initial encounter: Secondary | ICD-10-CM | POA: Diagnosis not present

## 2017-03-01 DIAGNOSIS — K922 Gastrointestinal hemorrhage, unspecified: Secondary | ICD-10-CM | POA: Diagnosis not present

## 2017-03-01 DIAGNOSIS — Y92009 Unspecified place in unspecified non-institutional (private) residence as the place of occurrence of the external cause: Secondary | ICD-10-CM | POA: Diagnosis not present

## 2017-03-01 DIAGNOSIS — K219 Gastro-esophageal reflux disease without esophagitis: Secondary | ICD-10-CM | POA: Diagnosis present

## 2017-03-01 DIAGNOSIS — R296 Repeated falls: Secondary | ICD-10-CM | POA: Diagnosis not present

## 2017-03-01 DIAGNOSIS — M25551 Pain in right hip: Secondary | ICD-10-CM | POA: Diagnosis not present

## 2017-03-01 DIAGNOSIS — I255 Ischemic cardiomyopathy: Secondary | ICD-10-CM | POA: Diagnosis not present

## 2017-03-01 DIAGNOSIS — S72001D Fracture of unspecified part of neck of right femur, subsequent encounter for closed fracture with routine healing: Secondary | ICD-10-CM | POA: Diagnosis not present

## 2017-03-01 DIAGNOSIS — I351 Nonrheumatic aortic (valve) insufficiency: Secondary | ICD-10-CM | POA: Diagnosis present

## 2017-03-01 DIAGNOSIS — K5900 Constipation, unspecified: Secondary | ICD-10-CM

## 2017-03-01 DIAGNOSIS — Z880 Allergy status to penicillin: Secondary | ICD-10-CM

## 2017-03-01 DIAGNOSIS — R5381 Other malaise: Secondary | ICD-10-CM | POA: Diagnosis not present

## 2017-03-01 DIAGNOSIS — R011 Cardiac murmur, unspecified: Secondary | ICD-10-CM | POA: Diagnosis present

## 2017-03-01 DIAGNOSIS — R531 Weakness: Secondary | ICD-10-CM | POA: Diagnosis not present

## 2017-03-01 DIAGNOSIS — J9811 Atelectasis: Secondary | ICD-10-CM | POA: Diagnosis not present

## 2017-03-01 DIAGNOSIS — E1142 Type 2 diabetes mellitus with diabetic polyneuropathy: Secondary | ICD-10-CM | POA: Diagnosis present

## 2017-03-01 DIAGNOSIS — Z85828 Personal history of other malignant neoplasm of skin: Secondary | ICD-10-CM

## 2017-03-01 DIAGNOSIS — Z86010 Personal history of colonic polyps: Secondary | ICD-10-CM | POA: Diagnosis not present

## 2017-03-01 DIAGNOSIS — Z79899 Other long term (current) drug therapy: Secondary | ICD-10-CM

## 2017-03-01 DIAGNOSIS — N183 Chronic kidney disease, stage 3 (moderate): Secondary | ICD-10-CM | POA: Diagnosis present

## 2017-03-01 DIAGNOSIS — L8990 Pressure ulcer of unspecified site, unspecified stage: Secondary | ICD-10-CM

## 2017-03-01 DIAGNOSIS — T148XXA Other injury of unspecified body region, initial encounter: Secondary | ICD-10-CM | POA: Diagnosis not present

## 2017-03-01 DIAGNOSIS — E1140 Type 2 diabetes mellitus with diabetic neuropathy, unspecified: Secondary | ICD-10-CM | POA: Diagnosis not present

## 2017-03-01 DIAGNOSIS — E1149 Type 2 diabetes mellitus with other diabetic neurological complication: Secondary | ICD-10-CM | POA: Diagnosis not present

## 2017-03-01 DIAGNOSIS — E785 Hyperlipidemia, unspecified: Secondary | ICD-10-CM | POA: Diagnosis not present

## 2017-03-01 DIAGNOSIS — E1165 Type 2 diabetes mellitus with hyperglycemia: Secondary | ICD-10-CM | POA: Diagnosis not present

## 2017-03-01 DIAGNOSIS — M810 Age-related osteoporosis without current pathological fracture: Secondary | ICD-10-CM | POA: Diagnosis present

## 2017-03-01 DIAGNOSIS — R195 Other fecal abnormalities: Secondary | ICD-10-CM | POA: Diagnosis not present

## 2017-03-01 DIAGNOSIS — W19XXXA Unspecified fall, initial encounter: Secondary | ICD-10-CM

## 2017-03-01 DIAGNOSIS — I5043 Acute on chronic combined systolic (congestive) and diastolic (congestive) heart failure: Secondary | ICD-10-CM | POA: Diagnosis not present

## 2017-03-01 DIAGNOSIS — I480 Paroxysmal atrial fibrillation: Secondary | ICD-10-CM | POA: Diagnosis present

## 2017-03-01 DIAGNOSIS — I214 Non-ST elevation (NSTEMI) myocardial infarction: Secondary | ICD-10-CM | POA: Diagnosis not present

## 2017-03-01 DIAGNOSIS — K5909 Other constipation: Secondary | ICD-10-CM | POA: Diagnosis present

## 2017-03-01 DIAGNOSIS — S52611A Displaced fracture of right ulna styloid process, initial encounter for closed fracture: Secondary | ICD-10-CM | POA: Diagnosis not present

## 2017-03-01 DIAGNOSIS — Z87891 Personal history of nicotine dependence: Secondary | ICD-10-CM

## 2017-03-01 DIAGNOSIS — K589 Irritable bowel syndrome without diarrhea: Secondary | ICD-10-CM | POA: Diagnosis present

## 2017-03-01 DIAGNOSIS — R946 Abnormal results of thyroid function studies: Secondary | ICD-10-CM | POA: Diagnosis not present

## 2017-03-01 DIAGNOSIS — I959 Hypotension, unspecified: Secondary | ICD-10-CM | POA: Diagnosis not present

## 2017-03-01 DIAGNOSIS — E039 Hypothyroidism, unspecified: Secondary | ICD-10-CM | POA: Diagnosis present

## 2017-03-01 DIAGNOSIS — I361 Nonrheumatic tricuspid (valve) insufficiency: Secondary | ICD-10-CM | POA: Diagnosis not present

## 2017-03-01 DIAGNOSIS — I350 Nonrheumatic aortic (valve) stenosis: Secondary | ICD-10-CM | POA: Diagnosis not present

## 2017-03-01 DIAGNOSIS — I130 Hypertensive heart and chronic kidney disease with heart failure and stage 1 through stage 4 chronic kidney disease, or unspecified chronic kidney disease: Secondary | ICD-10-CM | POA: Diagnosis present

## 2017-03-01 DIAGNOSIS — L89611 Pressure ulcer of right heel, stage 1: Secondary | ICD-10-CM | POA: Diagnosis not present

## 2017-03-01 DIAGNOSIS — M25531 Pain in right wrist: Secondary | ICD-10-CM | POA: Diagnosis not present

## 2017-03-01 DIAGNOSIS — R740 Nonspecific elevation of levels of transaminase and lactic acid dehydrogenase [LDH]: Secondary | ICD-10-CM | POA: Diagnosis not present

## 2017-03-01 DIAGNOSIS — Z419 Encounter for procedure for purposes other than remedying health state, unspecified: Secondary | ICD-10-CM

## 2017-03-01 DIAGNOSIS — I5041 Acute combined systolic (congestive) and diastolic (congestive) heart failure: Secondary | ICD-10-CM | POA: Diagnosis present

## 2017-03-01 DIAGNOSIS — E669 Obesity, unspecified: Secondary | ICD-10-CM | POA: Diagnosis present

## 2017-03-01 DIAGNOSIS — S299XXA Unspecified injury of thorax, initial encounter: Secondary | ICD-10-CM | POA: Diagnosis not present

## 2017-03-01 DIAGNOSIS — Z794 Long term (current) use of insulin: Secondary | ICD-10-CM

## 2017-03-01 DIAGNOSIS — S92153A Displaced avulsion fracture (chip fracture) of unspecified talus, initial encounter for closed fracture: Secondary | ICD-10-CM | POA: Diagnosis not present

## 2017-03-01 DIAGNOSIS — M797 Fibromyalgia: Secondary | ICD-10-CM | POA: Diagnosis present

## 2017-03-01 DIAGNOSIS — S72141A Displaced intertrochanteric fracture of right femur, initial encounter for closed fracture: Secondary | ICD-10-CM | POA: Diagnosis not present

## 2017-03-01 DIAGNOSIS — D62 Acute posthemorrhagic anemia: Secondary | ICD-10-CM | POA: Diagnosis not present

## 2017-03-01 DIAGNOSIS — Z09 Encounter for follow-up examination after completed treatment for conditions other than malignant neoplasm: Secondary | ICD-10-CM

## 2017-03-01 DIAGNOSIS — I5033 Acute on chronic diastolic (congestive) heart failure: Secondary | ICD-10-CM | POA: Diagnosis not present

## 2017-03-01 DIAGNOSIS — Z885 Allergy status to narcotic agent status: Secondary | ICD-10-CM

## 2017-03-01 DIAGNOSIS — S79911A Unspecified injury of right hip, initial encounter: Secondary | ICD-10-CM | POA: Diagnosis not present

## 2017-03-01 DIAGNOSIS — L89151 Pressure ulcer of sacral region, stage 1: Secondary | ICD-10-CM | POA: Diagnosis present

## 2017-03-01 DIAGNOSIS — R05 Cough: Secondary | ICD-10-CM | POA: Diagnosis not present

## 2017-03-01 DIAGNOSIS — E875 Hyperkalemia: Secondary | ICD-10-CM | POA: Diagnosis not present

## 2017-03-01 DIAGNOSIS — E1349 Other specified diabetes mellitus with other diabetic neurological complication: Secondary | ICD-10-CM | POA: Diagnosis not present

## 2017-03-01 DIAGNOSIS — Z6830 Body mass index (BMI) 30.0-30.9, adult: Secondary | ICD-10-CM

## 2017-03-01 DIAGNOSIS — I2510 Atherosclerotic heart disease of native coronary artery without angina pectoris: Secondary | ICD-10-CM | POA: Diagnosis present

## 2017-03-01 DIAGNOSIS — S52501A Unspecified fracture of the lower end of right radius, initial encounter for closed fracture: Secondary | ICD-10-CM | POA: Diagnosis not present

## 2017-03-01 DIAGNOSIS — S52571A Other intraarticular fracture of lower end of right radius, initial encounter for closed fracture: Secondary | ICD-10-CM | POA: Diagnosis not present

## 2017-03-01 DIAGNOSIS — I5021 Acute systolic (congestive) heart failure: Secondary | ICD-10-CM

## 2017-03-01 DIAGNOSIS — D649 Anemia, unspecified: Secondary | ICD-10-CM | POA: Diagnosis not present

## 2017-03-01 DIAGNOSIS — K449 Diaphragmatic hernia without obstruction or gangrene: Secondary | ICD-10-CM | POA: Diagnosis not present

## 2017-03-01 DIAGNOSIS — R339 Retention of urine, unspecified: Secondary | ICD-10-CM | POA: Diagnosis not present

## 2017-03-01 DIAGNOSIS — I272 Pulmonary hypertension, unspecified: Secondary | ICD-10-CM | POA: Diagnosis present

## 2017-03-01 DIAGNOSIS — S72001A Fracture of unspecified part of neck of right femur, initial encounter for closed fracture: Secondary | ICD-10-CM | POA: Diagnosis not present

## 2017-03-01 DIAGNOSIS — R509 Fever, unspecified: Secondary | ICD-10-CM | POA: Diagnosis not present

## 2017-03-01 DIAGNOSIS — R000 Tachycardia, unspecified: Secondary | ICD-10-CM | POA: Diagnosis not present

## 2017-03-01 DIAGNOSIS — R5383 Other fatigue: Secondary | ICD-10-CM | POA: Diagnosis not present

## 2017-03-01 DIAGNOSIS — S62101A Fracture of unspecified carpal bone, right wrist, initial encounter for closed fracture: Secondary | ICD-10-CM | POA: Diagnosis not present

## 2017-03-01 DIAGNOSIS — R9431 Abnormal electrocardiogram [ECG] [EKG]: Secondary | ICD-10-CM | POA: Diagnosis not present

## 2017-03-01 DIAGNOSIS — Z90710 Acquired absence of both cervix and uterus: Secondary | ICD-10-CM

## 2017-03-01 DIAGNOSIS — M79651 Pain in right thigh: Secondary | ICD-10-CM | POA: Diagnosis not present

## 2017-03-01 DIAGNOSIS — M6281 Muscle weakness (generalized): Secondary | ICD-10-CM | POA: Diagnosis not present

## 2017-03-01 DIAGNOSIS — Z7982 Long term (current) use of aspirin: Secondary | ICD-10-CM

## 2017-03-01 DIAGNOSIS — S72009A Fracture of unspecified part of neck of unspecified femur, initial encounter for closed fracture: Secondary | ICD-10-CM

## 2017-03-01 DIAGNOSIS — I352 Nonrheumatic aortic (valve) stenosis with insufficiency: Secondary | ICD-10-CM | POA: Diagnosis present

## 2017-03-01 DIAGNOSIS — Z888 Allergy status to other drugs, medicaments and biological substances status: Secondary | ICD-10-CM

## 2017-03-01 DIAGNOSIS — S52501D Unspecified fracture of the lower end of right radius, subsequent encounter for closed fracture with routine healing: Secondary | ICD-10-CM | POA: Diagnosis not present

## 2017-03-01 HISTORY — PX: INTRAMEDULLARY (IM) NAIL INTERTROCHANTERIC: SHX5875

## 2017-03-01 HISTORY — PX: ORIF WRIST FRACTURE: SHX2133

## 2017-03-01 HISTORY — DX: Fibromyalgia: M79.7

## 2017-03-01 LAB — URINALYSIS, ROUTINE W REFLEX MICROSCOPIC
Bilirubin Urine: NEGATIVE
GLUCOSE, UA: NEGATIVE mg/dL
HGB URINE DIPSTICK: NEGATIVE
KETONES UR: NEGATIVE mg/dL
LEUKOCYTES UA: NEGATIVE
NITRITE: NEGATIVE
PROTEIN: 30 mg/dL — AB
Specific Gravity, Urine: 1.025 (ref 1.005–1.030)
pH: 5 (ref 5.0–8.0)

## 2017-03-01 LAB — GLUCOSE, CAPILLARY
GLUCOSE-CAPILLARY: 250 mg/dL — AB (ref 65–99)
Glucose-Capillary: 200 mg/dL — ABNORMAL HIGH (ref 65–99)
Glucose-Capillary: 237 mg/dL — ABNORMAL HIGH (ref 65–99)
Glucose-Capillary: 252 mg/dL — ABNORMAL HIGH (ref 65–99)
Glucose-Capillary: 248 mg/dL — ABNORMAL HIGH (ref 65–99)

## 2017-03-01 LAB — BASIC METABOLIC PANEL
Anion gap: 10 (ref 5–15)
BUN: 18 mg/dL (ref 6–20)
CALCIUM: 8.7 mg/dL — AB (ref 8.9–10.3)
CO2: 21 mmol/L — AB (ref 22–32)
CREATININE: 0.91 mg/dL (ref 0.44–1.00)
Chloride: 104 mmol/L (ref 101–111)
GFR calc non Af Amer: 56 mL/min — ABNORMAL LOW (ref >60)
Glucose, Bld: 177 mg/dL — ABNORMAL HIGH (ref 65–99)
Potassium: 4.4 mmol/L (ref 3.5–5.1)
Sodium: 135 mmol/L (ref 135–145)

## 2017-03-01 LAB — TYPE AND SCREEN
ABO/RH(D): O NEG
ANTIBODY SCREEN: NEGATIVE

## 2017-03-01 LAB — CBC WITH DIFFERENTIAL/PLATELET
BASOS PCT: 0 %
Basophils Absolute: 0.1 10*3/uL (ref 0.0–0.1)
EOS ABS: 0.2 10*3/uL (ref 0.0–0.7)
Eosinophils Relative: 1 %
HEMATOCRIT: 38.5 % (ref 36.0–46.0)
Hemoglobin: 12.5 g/dL (ref 12.0–15.0)
Lymphocytes Relative: 14 %
Lymphs Abs: 2.6 10*3/uL (ref 0.7–4.0)
MCH: 26.5 pg (ref 26.0–34.0)
MCHC: 32.5 g/dL (ref 30.0–36.0)
MCV: 81.6 fL (ref 78.0–100.0)
MONO ABS: 1.4 10*3/uL — AB (ref 0.1–1.0)
MONOS PCT: 7 %
NEUTROS ABS: 15.2 10*3/uL — AB (ref 1.7–7.7)
NEUTROS PCT: 78 %
Platelets: 327 10*3/uL (ref 150–400)
RBC: 4.72 MIL/uL (ref 3.87–5.11)
RDW: 15.2 % (ref 11.5–15.5)
WBC: 19.5 10*3/uL — AB (ref 4.0–10.5)

## 2017-03-01 LAB — HEMOGLOBIN A1C
HEMOGLOBIN A1C: 6.6 % — AB (ref 4.8–5.6)
MEAN PLASMA GLUCOSE: 142.72 mg/dL

## 2017-03-01 LAB — ABO/RH: ABO/RH(D): O NEG

## 2017-03-01 LAB — CREATININE, SERUM
CREATININE: 1 mg/dL (ref 0.44–1.00)
GFR calc non Af Amer: 50 mL/min — ABNORMAL LOW (ref >60)
GFR, EST AFRICAN AMERICAN: 58 mL/min — AB (ref >60)

## 2017-03-01 LAB — PROTIME-INR
INR: 0.94
INR: 1
Prothrombin Time: 12.5 seconds (ref 11.4–15.2)
Prothrombin Time: 13.1 seconds (ref 11.4–15.2)

## 2017-03-01 LAB — APTT: aPTT: 28 seconds (ref 24–36)

## 2017-03-01 SURGERY — FIXATION, FRACTURE, INTERTROCHANTERIC, WITH INTRAMEDULLARY ROD
Anesthesia: General | Site: Wrist | Laterality: Right

## 2017-03-01 MED ORDER — 0.9 % SODIUM CHLORIDE (POUR BTL) OPTIME
TOPICAL | Status: DC | PRN
  Administered 2017-03-01: 1000 mL

## 2017-03-01 MED ORDER — CALCIUM CARBONATE 1250 (500 CA) MG PO CHEW: 1250.0000 mg | CHEWABLE_TABLET | Freq: Every day | ORAL | Status: DC

## 2017-03-01 MED ORDER — PHENYLEPHRINE HCL 10 MG/ML IJ SOLN
INTRAVENOUS | Status: DC | PRN
  Administered 2017-03-01: 25 ug/min via INTRAVENOUS

## 2017-03-01 MED ORDER — DEXAMETHASONE SODIUM PHOSPHATE 10 MG/ML IJ SOLN
INTRAMUSCULAR | Status: AC
  Filled 2017-03-01: qty 1

## 2017-03-01 MED ORDER — LIDOCAINE 2% (20 MG/ML) 5 ML SYRINGE
INTRAMUSCULAR | Status: DC | PRN
  Administered 2017-03-01: 40 mg via INTRAVENOUS

## 2017-03-01 MED ORDER — SODIUM CHLORIDE 0.9 % IV SOLN
INTRAVENOUS | Status: DC
  Administered 2017-03-01: 06:00:00 via INTRAVENOUS

## 2017-03-01 MED ORDER — OXYCODONE HCL 5 MG PO TABS: ORAL_TABLET | ORAL | 0 refills | Status: DC

## 2017-03-01 MED ORDER — INSULIN ASPART 100 UNIT/ML ~~LOC~~ SOLN
5.0000 [IU] | Freq: Once | SUBCUTANEOUS | Status: AC
  Administered 2017-03-01: 5 [IU] via INTRAVENOUS

## 2017-03-01 MED ORDER — FENTANYL CITRATE (PF) 100 MCG/2ML IJ SOLN
25.0000 ug | INTRAMUSCULAR | Status: DC | PRN
  Administered 2017-03-01: 25 ug via INTRAVENOUS

## 2017-03-01 MED ORDER — ONDANSETRON HCL 4 MG/2ML IJ SOLN
INTRAMUSCULAR | Status: DC | PRN
  Administered 2017-03-01: 4 mg via INTRAVENOUS

## 2017-03-01 MED ORDER — FENTANYL CITRATE (PF) 100 MCG/2ML IJ SOLN
INTRAMUSCULAR | Status: DC | PRN
  Administered 2017-03-01: 25 ug via INTRAVENOUS
  Administered 2017-03-01 (×4): 50 ug via INTRAVENOUS
  Administered 2017-03-01: 25 ug via INTRAVENOUS

## 2017-03-01 MED ORDER — MORPHINE SULFATE (PF) 4 MG/ML IV SOLN: 0.5000 mg | INTRAVENOUS | Status: DC | PRN

## 2017-03-01 MED ORDER — SODIUM CHLORIDE 0.9 % IV SOLN
INTRAVENOUS | Status: DC
  Administered 2017-03-01 – 2017-03-02 (×3): via INTRAVENOUS

## 2017-03-01 MED ORDER — OXYCODONE HCL 5 MG PO TABS
5.0000 mg | ORAL_TABLET | Freq: Four times a day (QID) | ORAL | Status: DC | PRN
  Administered 2017-03-01 – 2017-03-03 (×6): 5 mg via ORAL
  Filled 2017-03-01 (×6): qty 1

## 2017-03-01 MED ORDER — FENTANYL CITRATE (PF) 100 MCG/2ML IJ SOLN
INTRAMUSCULAR | Status: AC
  Filled 2017-03-01: qty 2

## 2017-03-01 MED ORDER — ENOXAPARIN SODIUM 40 MG/0.4ML ~~LOC~~ SOLN
40.0000 mg | SUBCUTANEOUS | Status: DC
  Administered 2017-03-02 – 2017-03-05 (×4): 40 mg via SUBCUTANEOUS
  Filled 2017-03-01 (×4): qty 0.4

## 2017-03-01 MED ORDER — SODIUM CHLORIDE 0.9 % IV SOLN: INTRAVENOUS | Status: DC

## 2017-03-01 MED ORDER — SUGAMMADEX SODIUM 200 MG/2ML IV SOLN
INTRAVENOUS | Status: DC | PRN
  Administered 2017-03-01: 150 mg via INTRAVENOUS

## 2017-03-01 MED ORDER — ONDANSETRON HCL 4 MG/2ML IJ SOLN
INTRAMUSCULAR | Status: AC
  Filled 2017-03-01: qty 2

## 2017-03-01 MED ORDER — ROCURONIUM BROMIDE 10 MG/ML (PF) SYRINGE
PREFILLED_SYRINGE | INTRAVENOUS | Status: DC | PRN
  Administered 2017-03-01: 50 mg via INTRAVENOUS
  Administered 2017-03-01: 10 mg via INTRAVENOUS
  Administered 2017-03-01: 20 mg via INTRAVENOUS
  Administered 2017-03-01: 10 mg via INTRAVENOUS

## 2017-03-01 MED ORDER — LISINOPRIL 5 MG PO TABS
5.0000 mg | ORAL_TABLET | Freq: Every day | ORAL | Status: DC
  Filled 2017-03-01: qty 1

## 2017-03-01 MED ORDER — INSULIN ASPART 100 UNIT/ML ~~LOC~~ SOLN
0.0000 [IU] | Freq: Three times a day (TID) | SUBCUTANEOUS | Status: DC
  Administered 2017-03-01: 3 [IU] via SUBCUTANEOUS
  Administered 2017-03-02 (×2): 2 [IU] via SUBCUTANEOUS
  Administered 2017-03-02 – 2017-03-03 (×2): 3 [IU] via SUBCUTANEOUS
  Administered 2017-03-03: 5 [IU] via SUBCUTANEOUS
  Administered 2017-03-03: 7 [IU] via SUBCUTANEOUS
  Administered 2017-03-04: 3 [IU] via SUBCUTANEOUS
  Administered 2017-03-04: 9 [IU] via SUBCUTANEOUS
  Administered 2017-03-04: 7 [IU] via SUBCUTANEOUS
  Administered 2017-03-05: 5 [IU] via SUBCUTANEOUS

## 2017-03-01 MED ORDER — INSULIN ASPART 100 UNIT/ML ~~LOC~~ SOLN
SUBCUTANEOUS | Status: AC
  Filled 2017-03-01: qty 1

## 2017-03-01 MED ORDER — CEFAZOLIN SODIUM-DEXTROSE 2-4 GM/100ML-% IV SOLN
2.0000 g | INTRAVENOUS | Status: AC
  Administered 2017-03-01: 2 g via INTRAVENOUS
  Filled 2017-03-01: qty 100

## 2017-03-01 MED ORDER — ASPIRIN 81 MG PO CHEW: 81.0000 mg | CHEWABLE_TABLET | Freq: Every day | ORAL | Status: DC

## 2017-03-01 MED ORDER — PHENYLEPHRINE 40 MCG/ML (10ML) SYRINGE FOR IV PUSH (FOR BLOOD PRESSURE SUPPORT)
PREFILLED_SYRINGE | INTRAVENOUS | Status: AC
Start: 2017-03-01 — End: 2017-03-01
  Filled 2017-03-01: qty 10

## 2017-03-01 MED ORDER — LIDOCAINE 2% (20 MG/ML) 5 ML SYRINGE
INTRAMUSCULAR | Status: AC
  Filled 2017-03-01: qty 5

## 2017-03-01 MED ORDER — CHLORHEXIDINE GLUCONATE 4 % EX LIQD: 60.0000 mL | Freq: Once | CUTANEOUS | Status: DC

## 2017-03-01 MED ORDER — PHENYLEPHRINE 40 MCG/ML (10ML) SYRINGE FOR IV PUSH (FOR BLOOD PRESSURE SUPPORT)
PREFILLED_SYRINGE | INTRAVENOUS | Status: DC | PRN
  Administered 2017-03-01: 40 ug via INTRAVENOUS
  Administered 2017-03-01: 80 ug via INTRAVENOUS

## 2017-03-01 MED ORDER — FLEET ENEMA 7-19 GM/118ML RE ENEM: 1.0000 | ENEMA | Freq: Once | RECTAL | Status: DC | PRN

## 2017-03-01 MED ORDER — ONDANSETRON HCL 4 MG PO TABS: 4.0000 mg | ORAL_TABLET | Freq: Three times a day (TID) | ORAL | 0 refills | Status: DC | PRN

## 2017-03-01 MED ORDER — BISACODYL 10 MG RE SUPP
10.0000 mg | Freq: Every day | RECTAL | Status: DC | PRN
  Administered 2017-03-05: 10 mg via RECTAL
  Filled 2017-03-01: qty 1

## 2017-03-01 MED ORDER — ASPIRIN 81 MG PO TABS: 81.0000 mg | ORAL_TABLET | Freq: Every day | ORAL | Status: DC

## 2017-03-01 MED ORDER — ONDANSETRON HCL 4 MG PO TABS
4.0000 mg | ORAL_TABLET | Freq: Three times a day (TID) | ORAL | Status: DC | PRN
  Administered 2017-03-02 – 2017-03-06 (×8): 4 mg via ORAL
  Filled 2017-03-01 (×8): qty 1

## 2017-03-01 MED ORDER — HEPARIN SODIUM (PORCINE) 5000 UNIT/ML IJ SOLN: 5000.0000 [IU] | Freq: Three times a day (TID) | INTRAMUSCULAR | Status: DC

## 2017-03-01 MED ORDER — BUPIVACAINE HCL (PF) 0.25 % IJ SOLN
INTRAMUSCULAR | Status: DC | PRN
  Administered 2017-03-01: 10 mL

## 2017-03-01 MED ORDER — CALCIUM CARBONATE 1250 (500 CA) MG PO TABS
1.0000 | ORAL_TABLET | Freq: Every day | ORAL | Status: DC
  Administered 2017-03-02 – 2017-03-04 (×3): 500 mg via ORAL
  Filled 2017-03-01 (×3): qty 1

## 2017-03-01 MED ORDER — HYDROCODONE-ACETAMINOPHEN 5-325 MG PO TABS: 1.0000 | ORAL_TABLET | Freq: Four times a day (QID) | ORAL | Status: DC | PRN

## 2017-03-01 MED ORDER — SUGAMMADEX SODIUM 200 MG/2ML IV SOLN
INTRAVENOUS | Status: AC
  Filled 2017-03-01: qty 2

## 2017-03-01 MED ORDER — PANTOPRAZOLE SODIUM 40 MG PO TBEC
40.0000 mg | DELAYED_RELEASE_TABLET | Freq: Every day | ORAL | Status: DC
  Administered 2017-03-02 – 2017-03-17 (×16): 40 mg via ORAL
  Filled 2017-03-01 (×16): qty 1

## 2017-03-01 MED ORDER — ACETAMINOPHEN 500 MG PO TABS
1000.0000 mg | ORAL_TABLET | Freq: Three times a day (TID) | ORAL | Status: DC
  Administered 2017-03-01 – 2017-03-17 (×47): 1000 mg via ORAL
  Filled 2017-03-01 (×48): qty 2

## 2017-03-01 MED ORDER — FENTANYL CITRATE (PF) 100 MCG/2ML IJ SOLN
50.0000 ug | Freq: Once | INTRAMUSCULAR | Status: AC
  Administered 2017-03-01: 50 ug via INTRAVENOUS
  Filled 2017-03-01: qty 2

## 2017-03-01 MED ORDER — PROPOFOL 10 MG/ML IV BOLUS
INTRAVENOUS | Status: AC
  Filled 2017-03-01: qty 20

## 2017-03-01 MED ORDER — BUPIVACAINE HCL (PF) 0.25 % IJ SOLN
INTRAMUSCULAR | Status: AC
  Filled 2017-03-01: qty 30

## 2017-03-01 MED ORDER — ONDANSETRON HCL 4 MG/2ML IJ SOLN: 4.0000 mg | Freq: Once | INTRAMUSCULAR | Status: DC | PRN

## 2017-03-01 MED ORDER — FENTANYL CITRATE (PF) 100 MCG/2ML IJ SOLN: 25.0000 ug | INTRAMUSCULAR | Status: DC | PRN

## 2017-03-01 MED ORDER — FENTANYL CITRATE (PF) 250 MCG/5ML IJ SOLN
INTRAMUSCULAR | Status: AC
  Filled 2017-03-01: qty 5

## 2017-03-01 MED ORDER — PROPOFOL 10 MG/ML IV BOLUS
INTRAVENOUS | Status: DC | PRN
  Administered 2017-03-01: 20 mg via INTRAVENOUS
  Administered 2017-03-01: 80 mg via INTRAVENOUS

## 2017-03-01 MED ORDER — HEPARIN SODIUM (PORCINE) 5000 UNIT/ML IJ SOLN
5000.0000 [IU] | Freq: Three times a day (TID) | INTRAMUSCULAR | Status: DC
  Filled 2017-03-01: qty 1

## 2017-03-01 MED ORDER — MEPERIDINE HCL 25 MG/ML IJ SOLN: 6.2500 mg | INTRAMUSCULAR | Status: DC | PRN

## 2017-03-01 MED ORDER — ENOXAPARIN SODIUM 40 MG/0.4ML ~~LOC~~ SOLN: 40.0000 mg | SUBCUTANEOUS | 0 refills | Status: DC

## 2017-03-01 MED ORDER — SENNOSIDES-DOCUSATE SODIUM 8.6-50 MG PO TABS
1.0000 | ORAL_TABLET | Freq: Every evening | ORAL | Status: DC | PRN
  Administered 2017-03-02: 1 via ORAL
  Filled 2017-03-01: qty 1

## 2017-03-01 MED ORDER — ROCURONIUM BROMIDE 10 MG/ML (PF) SYRINGE
PREFILLED_SYRINGE | INTRAVENOUS | Status: AC
  Filled 2017-03-01: qty 5

## 2017-03-01 MED ORDER — ACETAMINOPHEN 500 MG PO TABS: ORAL_TABLET | ORAL | 2 refills | Status: DC

## 2017-03-01 MED ORDER — LACTATED RINGERS IV SOLN
INTRAVENOUS | Status: DC | PRN
  Administered 2017-03-01 (×2): via INTRAVENOUS

## 2017-03-01 SURGICAL SUPPLY — 80 items
APL SKNCLS STERI-STRIP NONHPOA (GAUZE/BANDAGES/DRESSINGS) ×4
BANDAGE ELASTIC 3 VELCRO ST LF (GAUZE/BANDAGES/DRESSINGS) ×4 IMPLANT
BENZOIN TINCTURE PRP APPL 2/3 (GAUZE/BANDAGES/DRESSINGS) ×4 IMPLANT
BIT DRILL 2.2 SS TIBIAL (BIT) ×4 IMPLANT
BIT DRILL AO GAMMA 4.2X180 (BIT) ×3 IMPLANT
BNDG CMPR 9X4 STRL LF SNTH (GAUZE/BANDAGES/DRESSINGS) ×2
BNDG COHESIVE 4X5 TAN STRL (GAUZE/BANDAGES/DRESSINGS) ×4 IMPLANT
BNDG ESMARK 4X9 LF (GAUZE/BANDAGES/DRESSINGS) ×3 IMPLANT
BNDG GAUZE ELAST 4 BULKY (GAUZE/BANDAGES/DRESSINGS) IMPLANT
CANISTER SUCTION 2500CC (MISCELLANEOUS) ×3 IMPLANT
CLOSURE WOUND 1/2 X4 (GAUZE/BANDAGES/DRESSINGS) ×2
COVER PERINEAL POST (MISCELLANEOUS) ×4 IMPLANT
COVER SURGICAL LIGHT HANDLE (MISCELLANEOUS) ×4 IMPLANT
CUFF TOURNIQUET SINGLE 18IN (TOURNIQUET CUFF) ×3 IMPLANT
DRAPE C-ARM MINI 42X72 WSTRAPS (DRAPES) ×3 IMPLANT
DRAPE EXTREMITY TIBURON (DRAPES) ×3 IMPLANT
DRAPE IMP U-DRAPE 54X76 (DRAPES) ×4 IMPLANT
DRAPE STERI IOBAN 125X83 (DRAPES) ×4 IMPLANT
DRAPE SURG 17X23 STRL (DRAPES) ×3 IMPLANT
DRSG MEPILEX BORDER 4X4 (GAUZE/BANDAGES/DRESSINGS) ×4 IMPLANT
DURAPREP 26ML APPLICATOR (WOUND CARE) ×4 IMPLANT
ELECT REM PT RETURN 9FT ADLT (ELECTROSURGICAL) ×4
ELECTRODE REM PT RTRN 9FT ADLT (ELECTROSURGICAL) ×2 IMPLANT
GAUZE SPONGE 4X4 12PLY STRL (GAUZE/BANDAGES/DRESSINGS) ×4 IMPLANT
GAUZE XEROFORM 1X8 LF (GAUZE/BANDAGES/DRESSINGS) ×3 IMPLANT
GLOVE BIO SURGEON STRL SZ7.5 (GLOVE) ×8 IMPLANT
GLOVE BIOGEL PI IND STRL 8 (GLOVE) ×4 IMPLANT
GLOVE BIOGEL PI INDICATOR 8 (GLOVE) ×4
GOWN STRL REUS W/ TWL LRG LVL3 (GOWN DISPOSABLE) ×6 IMPLANT
GOWN STRL REUS W/TWL LRG LVL3 (GOWN DISPOSABLE) ×12
GUIDEROD T2 3X1000 (ROD) ×3 IMPLANT
K-WIRE  3.2X450M STR (WIRE) ×2
K-WIRE 1.6 (WIRE) ×12
K-WIRE 3.2X450M STR (WIRE) ×2
K-WIRE FX5X1.6XNS BN SS (WIRE) ×6
KIT NAIL LONG 10X380MMX125 (Nail) ×3 IMPLANT
KIT ROOM TURNOVER OR (KITS) ×4 IMPLANT
KWIRE 3.2X450M STR (WIRE) ×2 IMPLANT
KWIRE FX5X1.6XNS BN SS (WIRE) IMPLANT
MANIFOLD NEPTUNE II (INSTRUMENTS) ×1 IMPLANT
NEEDLE 22X1 1/2 (OR ONLY) (NEEDLE) ×4 IMPLANT
NS IRRIG 1000ML POUR BTL (IV SOLUTION) ×4 IMPLANT
PACK GENERAL/GYN (CUSTOM PROCEDURE TRAY) ×4 IMPLANT
PAD ARMBOARD 7.5X6 YLW CONV (MISCELLANEOUS) ×7 IMPLANT
PAD CAST 3X4 CTTN HI CHSV (CAST SUPPLIES) ×1 IMPLANT
PAD CAST 4YDX4 CTTN HI CHSV (CAST SUPPLIES) ×2 IMPLANT
PADDING CAST COTTON 3X4 STRL (CAST SUPPLIES) ×4
PADDING CAST COTTON 4X4 STRL (CAST SUPPLIES) ×4
PEG LOCKING SMOOTH 2.2X18 (Peg) ×3 IMPLANT
PLATE NARROW DVR RIGHT (Plate) ×3 IMPLANT
SCREW LAG GAMMA 3 TI 10.5X90MM (Screw) ×3 IMPLANT
SCREW LOCK 12X2.7X 3 LD (Screw) ×6 IMPLANT
SCREW LOCK 18X2.7X 3 LD TPR (Screw) IMPLANT
SCREW LOCK 20X2.7X 3 LD TPR (Screw) ×1 IMPLANT
SCREW LOCK 22X2.7X 3 LD TPR (Screw) ×4 IMPLANT
SCREW LOCKING 2.7X12MM (Screw) ×12 IMPLANT
SCREW LOCKING 2.7X13MM (Screw) ×4 IMPLANT
SCREW LOCKING 2.7X18 (Screw) ×4 IMPLANT
SCREW LOCKING 2.7X20MM (Screw) ×4 IMPLANT
SCREW LOCKING 2.7X22MM (Screw) ×8 IMPLANT
SCREW LOCKING T2 F/T  5MMX40MM (Screw) ×2 IMPLANT
SCREW LOCKING T2 F/T 5MMX40MM (Screw) ×1 IMPLANT
SCREW MULTI DIRECTIONAL 2.7X20 (Screw) ×3 IMPLANT
SCREW NONLOCK 2.7X18MM (Screw) ×3 IMPLANT
SPLINT PLASTER CAST XFAST 3X15 (CAST SUPPLIES) ×1 IMPLANT
SPLINT PLASTER XTRA FASTSET 3X (CAST SUPPLIES) ×2
STRIP CLOSURE SKIN 1/2X4 (GAUZE/BANDAGES/DRESSINGS) ×2 IMPLANT
SUCTION FRAZIER HANDLE 10FR (MISCELLANEOUS) ×2
SUCTION TUBE FRAZIER 10FR DISP (MISCELLANEOUS) ×2 IMPLANT
SUT MNCRL AB 3-0 PS2 18 (SUTURE) ×6 IMPLANT
SUT MNCRL AB 4-0 PS2 18 (SUTURE) IMPLANT
SUT MON AB 2-0 CT1 27 (SUTURE) IMPLANT
SUT VIC AB 0 CT1 27 (SUTURE) ×4
SUT VIC AB 0 CT1 27XBRD ANBCTR (SUTURE) ×2 IMPLANT
SUT VIC AB 2-0 CT1 27 (SUTURE) ×8
SUT VIC AB 2-0 CT1 TAPERPNT 27 (SUTURE) IMPLANT
SUT VIC AB 3-0 X1 27 (SUTURE) ×4 IMPLANT
SYR CONTROL 10ML LL (SYRINGE) ×3 IMPLANT
TOWEL OR 17X24 6PK STRL BLUE (TOWEL DISPOSABLE) ×4 IMPLANT
TOWEL OR 17X26 10 PK STRL BLUE (TOWEL DISPOSABLE) ×4 IMPLANT

## 2017-03-01 NOTE — ED Provider Notes (Signed)
[place] EMERGENCY DEPARTMENT Provider Note   CSN: [id number] Arrival date & time: 03/01/17  1025     History   Chief Complaint Chief Complaint  Patient presents with  . Fall    HPI [name] is a 83 y.o. female.  Patient presents after a fall.  She was trying to go to the bathroom when she lost her balance and fell landing on her right side.  Complains of pain to her right hip and right wrist.  Denies hitting her head or losing consciousness.  She does not take any blood thinners.  EMS reports shortening and rotation of right leg.  She denies any head, neck or back pain.  No chest pain or abdominal pain.  No shortness of breath.   no focal weakness, numbness or tingling.   The history is provided by the patient and the EMS personnel.  Fall  Pertinent negatives include no chest pain, no abdominal pain, no headaches and no shortness of breath.    Past Medical History:  Diagnosis Date  . Abdominal pain, epigastric   . Anxiety   . Corns and callosities   . Diabetes mellitus   . Diaphragmatic hernia without mention of obstruction or gangrene   . Diverticulosis of colon (without mention of hemorrhage) 2009  . GERD (gastroesophageal reflux disease)   . Hiatal hernia   . History of colon polyps 2009   Hyperplastic  . Hx of adenomatous colonic polyps 2009  . Hyperlipidemia   . IBS (irritable bowel syndrome)   . Internal hemorrhoids without mention of complication   . Rash and other nonspecific skin eruption   . Status post dilation of esophageal narrowing   . Thyroid disease   . Type II or unspecified type diabetes mellitus with neurological manifestations, not stated as uncontrolled(250.60)     Patient Active Problem List   Diagnosis Date Noted  . Carotid stenosis, asymptomatic, bilateral 01/12/2017  . Dizziness 12/05/2016  . Fatigue 12/05/2016  . Osteoporosis 07/19/2016  . History of basal cell cancer 07/12/2016  . Seborrheic dermatitis  07/12/2016  . Allergic rhinitis 07/12/2016  . Falls frequently 11/03/2015  . Right humeral fracture 11/03/2015  . Systolic murmur 85/27/7824  . Counseling regarding end of life decision making 02/20/2015  . Hypertension complicating diabetes ([place]) 02/20/2015  . Microalbuminuria due to type 2 diabetes mellitus ([place]) 11/06/2014  . Hematuria 11/04/2014  . Hx of partial thyroidectomy 11/04/2014  . Chronic constipation 11/04/2014  . Atherosclerosis of native arteries of the extremities with intermittent claudication 10/18/2012  . PAD (peripheral artery disease) ([place]) 12/06/2011  . Claudication, intermittent ([place]) 12/06/2011  . Chronic total occlusion of artery of the extremities ([place]) 09/15/2011  . Hyperlipidemia 10/08/2007  . Diabetic peripheral neuropathy associated with type 2 diabetes mellitus ([place]) 05/11/2007  . CALLUSES, FEET, BILATERAL 05/11/2007  . Secondary DM with neurological manifestations ([place]) 09/25/2006  . GERD 09/25/2006  . HIATAL HERNIA 09/25/2006    Past Surgical History:  Procedure Laterality Date  . ABDOMINAL HYSTERECTOMY    . BASAL CELL CARCINOMA EXCISION  04/2016   corner of left eye  . bmd  2007  . CATARACT EXTRACTION    . ESOPHAGOGASTRODUODENOSCOPY  2007  . HERNIA REPAIR    . LAPAROSCOPIC NISSEN FUNDOPLICATION  2353  . THYROIDECTOMY, PARTIAL      OB History    No data available       Home Medications    Prior to Admission medications   Medication Sig Start  Date End Date Taking? Authorizing Provider  alendronate (FOSAMAX) 70 MG tablet Take 1 tablet (70 mg total) by mouth every 7 (seven) days. Take with a full glass of water on an empty stomach. 07/22/16   Bedsole, Amy E, MD  aspirin 81 MG tablet Take 81 mg by mouth daily.      [provider]  glucose blood (ONE TOUCH ULTRA TEST) test strip USE ONE STRIP TO CHECK GLUCOSE 4 TIMES A DAY 01/15/17   Bedsole, Amy E, MD  INS SYRINGE/NEEDLE .5CC/28G 28G X 1/2" 0.5 ML MISC Inject insulin as directed.  Dx 250.00 12/07/11   Carollee Herter, Alferd Apa, DO  insulin aspart (NOVOLOG FLEXPEN) 100 UNIT/ML FlexPen Inject 10 Units into the skin 3 (three) times daily with meals. 09/26/16   Bedsole, Amy E, MD  insulin NPH Human (HUMULIN N,NOVOLIN N) 100 UNIT/ML injection Inject 15 Units into the skin at bedtime.     [provider]  Insulin Pen Needle (PEN NEEDLES) 32G X 4 MM MISC 1 Units by Does not apply route 3 (three) times daily. 09/28/16   Bedsole, Amy E, MD  insulin regular (NOVOLIN R,HUMULIN R) 100 units/mL injection Inject 10 Units into the skin 3 (three) times daily before meals.    [provider]  Lancets [facility] ULTRASOFT) lancets Check blood sugar daily. 07/16/13   Roma Schanz R, DO  lisinopril (PRINIVIL,ZESTRIL) 5 MG tablet TAKE ONE TABLET BY MOUTH ONCE DAILY 09/13/16   Jinny Sanders, MD  Multiple Vitamins-Calcium (VIACTIV MULTI-VITAMIN) CHEW Chew 2 each by mouth daily. Reported on 08/28/2015    [provider]  pantoprazole (PROTONIX) 40 MG tablet Take 1 tablet (40 mg total) by mouth daily. 07/22/16   Jinny Sanders, MD    Family History Family History  Problem Relation Age of Onset  . Uterine cancer Mother   . Cancer Mother   . Diabetes Maternal Grandfather   . Diabetes Son   . Hyperlipidemia Son   . Heart disease Paternal Grandmother   . Breast cancer Paternal Aunt   . Colon cancer Neg Hx   . Stomach cancer Neg Hx     Social History Social History   Tobacco Use  . Smoking status: Former Smoker    Packs/day: 0.25    Years: 4.00    Pack years: 1.00    Types: Cigarettes    Last attempt to quit: 03/10/1981    Years since quitting: 36.0  . Smokeless tobacco: Never Used  Substance Use Topics  . Alcohol use: No    Alcohol/week: 0.0 oz  . Drug use: No     Allergies   Codeine; Metformin; Penicillins; Pioglitazone; and Fenofibrate   Review of Systems Review of Systems  Constitutional: Negative for activity change, appetite change and fever.    HENT: Negative for congestion and rhinorrhea.   Respiratory: Negative for cough, chest tightness and shortness of breath.   Cardiovascular: Negative for chest pain.  Gastrointestinal: Negative for abdominal pain, nausea and vomiting.  Genitourinary: Negative for dysuria and hematuria.  Musculoskeletal: Positive for arthralgias and myalgias. Negative for back pain and neck pain.  Neurological: Negative for dizziness, weakness and headaches.    all other systems are negative except as noted in the HPI and PMH.    Physical Exam Updated Vital Signs BP (!) 180/89 (BP Location: Left Arm)   Pulse 97   Temp 98.9 F (37.2 C) (Temporal)   Resp 16   Ht 5' 3.5" (1.613 m)  Wt 67.1 kg (148 lb)   SpO2 97%   BMI 25.81 kg/m   Physical Exam  Constitutional: She is oriented to person, place, and time. She appears well-developed and well-nourished. No distress.  HENT:  Head: Normocephalic and atraumatic.  Mouth/Throat: Oropharynx is clear and moist. No oropharyngeal exudate.  Eyes: Conjunctivae and EOM are normal. Pupils are equal, round, and reactive to light.  Neck: Normal range of motion. Neck supple.  No C spine tenderness  Cardiovascular: Normal rate, regular rhythm, normal heart sounds and intact distal pulses.  No murmur heard. Pulmonary/Chest: Effort normal and breath sounds normal. No respiratory distress.  Abdominal: Soft. There is no tenderness. There is no rebound and no guarding.  Musculoskeletal: She exhibits edema, tenderness and deformity.  Tenderness to right lateral hip.  Right leg is shortened and externally rotated.  Intact PT pulse with Doppler.  Able to wiggle toes.  There is edema dorsally to right wrist and hand.  Intact radial pulse, intact cardinal hand movements  Neurological: She is alert and oriented to person, place, and time. No cranial nerve deficit. She exhibits normal muscle tone. Coordination normal.   5/5 strength throughout. CN 2-12 intact.Equal grip  strength.   Skin: Skin is warm.  Psychiatric: She has a normal mood and affect. Her behavior is normal.  Nursing note and vitals reviewed.    ED Treatments / Results  Labs (all labs ordered are listed, but only abnormal results are displayed) Labs Reviewed  CBC WITH DIFFERENTIAL/PLATELET - Abnormal; Notable for the following components:      Result Value   WBC 19.5 (*)    Neutro Abs 15.2 (*)    Monocytes Absolute 1.4 (*)    All other components within normal limits  BASIC METABOLIC PANEL - Abnormal; Notable for the following components:   CO2 21 (*)    Glucose, Bld 177 (*)    Calcium 8.7 (*)    GFR calc non Af Amer 56 (*)    All other components within normal limits  PROTIME-INR    EKG  EKG Interpretation  Date/Time:  Wednesday March 01 2017 04:07:19 EST Ventricular Rate:  92 PR Interval:    QRS Duration: 87 QT Interval:  364 QTC Calculation: 451 R Axis:   32 Text Interpretation:  Sinus rhythm Posterior infarct, old Nonspecific T abnormalities, lateral leads No significant change was found Confirmed by Ezequiel Essex [id number]) on 03/01/2017 4:14:55 AM       Radiology Dg Chest 1 View  Result Date: 03/01/2017 CLINICAL DATA:  [place] this morning. EXAM: CHEST 1 VIEW COMPARISON:  04/20/2012 FINDINGS: Cardiac enlargement. No vascular congestion or edema. Linear fibrosis or atelectasis in the lung bases. No focal consolidation. No blunting of costophrenic angles. No pneumothorax. Calcification of the aorta. Degenerative changes in the spine and shoulders. IMPRESSION: Fibrosis or atelectasis in the lung bases.  No focal consolidation. Electronically Signed   By: Lucienne Capers M.D.   On: 03/01/2017 05:04   Dg Wrist Complete Right  Result Date: 03/01/2017 CLINICAL DATA:  Fall with right wrist pain EXAM: RIGHT WRIST - COMPLETE 3+ VIEW COMPARISON:  None. FINDINGS: There is a impacted, comminuted fracture of the distal right radius. There is a minimally displaced fracture of  the right ulnar styloid. There is moderate circumferential soft tissue swelling. No carpal fracture. IMPRESSION: Impacted, comminuted fracture of the distal right radius and minimally displaced fracture of the ulnar styloid. Electronically Signed   By: Ulyses Jarred M.D.   On: 03/01/2017 05:01  Dg Hand Complete Right  Result Date: 03/01/2017 CLINICAL DATA:  Fall EXAM: RIGHT HAND - COMPLETE 3+ VIEW COMPARISON:  None. FINDINGS: The bones are osteopenic. There is no fracture or dislocation of the bones of the right hand. Fracture of the right wrist is characterized more completely on the dedicated wrist study. No advanced osteoarthrosis. No erosions. IMPRESSION: Osteopenia without acute fracture or dislocation of the right hand. Electronically Signed   By: Ulyses Jarred M.D.   On: 03/01/2017 04:59   Dg Hip Unilat W Or Wo Pelvis 2-3 Views Right  Result Date: 03/01/2017 CLINICAL DATA:  Lateral right hip pain after a fall this morning. EXAM: DG HIP (WITH OR WITHOUT PELVIS) 2-3V RIGHT COMPARISON:  None. FINDINGS: Comminuted inter trochanteric fractures of the proximal right femur with varus angulation of the fracture fragments. Displacement of greater and lesser trochanteric fragments. No dislocation at the hip joint. Degenerative changes in the lower lumbar spine and in both hips. Pelvis appears intact. SI joints and symphysis pubis are not displaced. Prominent vascular calcifications. IMPRESSION: Acute comminuted inter trochanteric fractures of the proximal right femur with varus angulation. Electronically Signed   By: Lucienne Capers M.D.   On: 03/01/2017 05:03    Procedures Procedures (including critical care time)  Medications Ordered in ED Medications - No data to display   Initial Impression / Assessment and Plan / ED Course  I have reviewed the triage vital signs and the nursing notes.  Pertinent labs & imaging results that were available during my care of the patient were reviewed by me  and considered in my medical decision making (see chart for details).    Patient with mechanical fall presenting with right hip and right wrist pain.  Did not hit head or lose consciousness.  No blood thinner use.  Imaging remarkable for right intertrochanteric hip fracture as well as right distal radius fracture that is impacted and comminuted. Both injuries are neurovascularly intact.  Case discussed with Dr. Percell Miller on call for Dr. Mardelle Matte.  He will evaluate patient and plan for hip repair today. Wrist is splinted in sugar tong splint.  Discussed with Dr. Percell Miller as well as Dr. Amedeo Plenty.  Admission to hospital service discussed with Dr. Tamala Julian.   Final Clinical Impressions(s) / ED Diagnoses   Final diagnoses:  Fall, initial encounter  Closed right hip fracture, initial encounter [facility])  Closed fracture of distal end of right radius, unspecified fracture morphology, initial encounter    ED Discharge Orders    None       Chimere Klingensmith, Annie Main, MD 03/01/17 [phone]

## 2017-03-01 NOTE — H&P (View-Only) (Signed)
ORTHOPAEDIC CONSULTATION  REQUESTING PHYSICIAN: Waldemar Dickens, MD  Chief Complaint: Right wrist and hip pain  HPI: [name] is a 83 y.o. female who complains of  Pain in the right wrist and hip after a fall this morning going to the bathroom at home.  She lives at home with her son and uses a walker at baseline but did not use it to try and get to the bathroom this morning.  No other injuries.  Orthopedics consulted for management options.    Past Medical History:  Diagnosis Date  . Abdominal pain, epigastric   . Anxiety   . Corns and callosities   . Diabetes mellitus   . Diaphragmatic hernia without mention of obstruction or gangrene   . Diverticulosis of colon (without mention of hemorrhage) 2009  . Fibromyalgia   . GERD (gastroesophageal reflux disease)   . Hiatal hernia   . History of colon polyps 2009   Hyperplastic  . Hx of adenomatous colonic polyps 2009  . Hyperlipidemia   . IBS (irritable bowel syndrome)   . Internal hemorrhoids without mention of complication   . Rash and other nonspecific skin eruption   . Status post dilation of esophageal narrowing   . Thyroid disease   . Type II or unspecified type diabetes mellitus with neurological manifestations, not stated as uncontrolled(250.60)    Past Surgical History:  Procedure Laterality Date  . ABDOMINAL HYSTERECTOMY    . BASAL CELL CARCINOMA EXCISION  04/2016   corner of left eye  . bmd  2007  . CATARACT EXTRACTION    . ESOPHAGOGASTRODUODENOSCOPY  2007  . HERNIA REPAIR    . LAPAROSCOPIC NISSEN FUNDOPLICATION  8119  . THYROIDECTOMY, PARTIAL     Social History   Socioeconomic History  . Marital status: Widowed    Spouse name: None  . Number of children: 3  . Years of education: None  . Highest education level: None  Social Needs  . Financial resource strain: None  . Food insecurity - worry: None  . Food insecurity - inability: None  . Transportation needs - medical: None  .  Transportation needs - non-medical: None  Occupational History  . Occupation: retired    Fish farm manager: RETIRED  Tobacco Use  . Smoking status: Former Smoker    Packs/day: 0.25    Years: 4.00    Pack years: 1.00    Types: Cigarettes    Last attempt to quit: 03/10/1981    Years since quitting: 36.0  . Smokeless tobacco: Never Used  Substance and Sexual Activity  . Alcohol use: No    Alcohol/week: 0.0 oz  . Drug use: No  . Sexual activity: No  Other Topics Concern  . None  Social History Narrative   Widowed   3 children         Family History  Problem Relation Age of Onset  . Uterine cancer Mother   . Cancer Mother   . Diabetes Maternal Grandfather   . Diabetes Son   . Hyperlipidemia Son   . Heart disease Paternal Grandmother   . Breast cancer Paternal Aunt   . Colon cancer Neg Hx   . Stomach cancer Neg Hx    Allergies  Allergen Reactions  . Codeine     Rapid heart rate  . Metformin     unspecified  . Penicillins     unspecified  . Pioglitazone     REACTION: rash  . Fenofibrate Palpitations   Prior  to Admission medications   Medication Sig Start Date End Date Taking? Authorizing Provider  aspirin 81 MG tablet Take 81 mg by mouth daily.     Yes [provider]  calcium carbonate (OS-CAL) 1250 (500 Ca) MG chewable tablet Chew 1 tablet by mouth daily.   Yes [provider]  insulin aspart (NOVOLOG FLEXPEN) 100 UNIT/ML FlexPen Inject 10 Units into the skin 3 (three) times daily with meals. 09/26/16  Yes Bedsole, Amy E, MD  insulin NPH Human (HUMULIN N,NOVOLIN N) 100 UNIT/ML injection Inject 15 Units into the skin at bedtime.    Yes [provider]  lisinopril (PRINIVIL,ZESTRIL) 5 MG tablet TAKE ONE TABLET BY MOUTH ONCE DAILY 09/13/16  Yes Bedsole, Amy E, MD  omeprazole (PRILOSEC) 20 MG capsule Take 20 mg by mouth 2 (two) times daily before a meal.   Yes [provider]  alendronate (FOSAMAX) 70 MG tablet Take 1 tablet (70 mg total) by  mouth every 7 (seven) days. Take with a full glass of water on an empty stomach. Patient not taking: Reported on 03/01/2017 07/22/16   Jinny Sanders, MD  pantoprazole (PROTONIX) 40 MG tablet Take 1 tablet (40 mg total) by mouth daily. Patient not taking: Reported on 03/01/2017 07/22/16   Jinny Sanders, MD   Dg Chest 1 View  Result Date: 03/01/2017 CLINICAL DATA:  [place] this morning. EXAM: CHEST 1 VIEW COMPARISON:  04/20/2012 FINDINGS: Cardiac enlargement. No vascular congestion or edema. Linear fibrosis or atelectasis in the lung bases. No focal consolidation. No blunting of costophrenic angles. No pneumothorax. Calcification of the aorta. Degenerative changes in the spine and shoulders. IMPRESSION: Fibrosis or atelectasis in the lung bases.  No focal consolidation. Electronically Signed   By: Lucienne Capers M.D.   On: 03/01/2017 05:04   Dg Wrist Complete Right  Result Date: 03/01/2017 CLINICAL DATA:  Fall with right wrist pain EXAM: RIGHT WRIST - COMPLETE 3+ VIEW COMPARISON:  None. FINDINGS: There is a impacted, comminuted fracture of the distal right radius. There is a minimally displaced fracture of the right ulnar styloid. There is moderate circumferential soft tissue swelling. No carpal fracture. IMPRESSION: Impacted, comminuted fracture of the distal right radius and minimally displaced fracture of the ulnar styloid. Electronically Signed   By: Ulyses Jarred M.D.   On: 03/01/2017 05:01   Dg Hand Complete Right  Result Date: 03/01/2017 CLINICAL DATA:  Fall EXAM: RIGHT HAND - COMPLETE 3+ VIEW COMPARISON:  None. FINDINGS: The bones are osteopenic. There is no fracture or dislocation of the bones of the right hand. Fracture of the right wrist is characterized more completely on the dedicated wrist study. No advanced osteoarthrosis. No erosions. IMPRESSION: Osteopenia without acute fracture or dislocation of the right hand. Electronically Signed   By: Ulyses Jarred M.D.   On: 03/01/2017 04:59    Dg Hip Unilat W Or Wo Pelvis 2-3 Views Right  Result Date: 03/01/2017 CLINICAL DATA:  Lateral right hip pain after a fall this morning. EXAM: DG HIP (WITH OR WITHOUT PELVIS) 2-3V RIGHT COMPARISON:  None. FINDINGS: Comminuted inter trochanteric fractures of the proximal right femur with varus angulation of the fracture fragments. Displacement of greater and lesser trochanteric fragments. No dislocation at the hip joint. Degenerative changes in the lower lumbar spine and in both hips. Pelvis appears intact. SI joints and symphysis pubis are not displaced. Prominent vascular calcifications. IMPRESSION: Acute comminuted inter trochanteric fractures of the proximal right femur with varus angulation. Electronically Signed  By: Lucienne Capers M.D.   On: 03/01/2017 05:03   Family History Reviewed and non-contributory, no pertinent history of problems with bleeding or anesthesia      Review of Systems 14 system ROS conducted and negative except for that noted in HPI   OBJECTIVE  Vitals: Patient Vitals for the past 8 hrs:  BP Temp Temp src Pulse Resp SpO2 Height Weight  03/01/17 0715 135/85 - - (!) 105 (!) 22 94 % - -  03/01/17 0700 (!) 149/77 - - (!) 106 17 94 % - -  03/01/17 0630 128/73 - - 100 17 93 % - -  03/01/17 0600 (!) 150/73 - - (!) 105 18 94 % - -  03/01/17 0545 131/73 - - 100 19 93 % - -  03/01/17 0530 (!) 145/77 - - 98 20 96 % - -  03/01/17 0400 (!) 163/82 - - 93 19 95 % - -  03/01/17 0354 - - - - - - 5' 3.5" (1.613 m) 67.1 kg (148 lb)  03/01/17 0353 (!) 180/89 98.9 F (37.2 C) Temporal 97 16 97 % - -   General: Alert, no acute distress Cardiovascular: No pedal edema Respiratory: No cyanosis, no use of accessory musculature GI: No organomegaly, abdomen is soft and non-tender Skin: No lesions in the area of chief complaint other than those listed below in MSK exam.  Neurologic: Sensation intact distally save for the below mentioned MSK exam Psychiatric: Patient is competent  for consent with normal mood and affect Lymphatic: No axillary or cervical lymphadenopathy Extremities  KGM:WNUUVO in place.  WWP fingers, +ain/pin/ulnar motor function.  Sensation preserved throughout hand.   LUE:No swelling, deformity, or effusion. Skin intact. Nontender to palpation, with full and painless ROM throughout hand with [facility] of 0. + Motor in  AIN, PIN, Ulnar distributions. Sensation intact in medial, radial, and ulnar distributions. Negative tinel at carpal tunnel.  2+ radial pulse with warm and well perfused digits. Compartments soft and compressible.  ZDG:UYQIHKVQQ and externally rotated.  ROM deferred. + GS/TA/EHL. Sensation intact in DP/SP/S/S/P distributions. 2+ DP pulse with warm and well perfused digits. Compartments soft and compressible, with no pain on passive stretch. LLE:No swelling, deformity, or effusion. Skin intact. Nontender to palpation, with full and painless ROM throughout. + GS/TA/EHL. Sensation intact in DP/SP/S/S/P distributions. 2+ DP pulse with warm and well perfused digits. Compartments soft and compressible, with no pain on passive stretch.     Test Results Imaging XR of the wrist and hip reviewed.  Comminuted displaced distal radius fracture noted as well as Comminuted right intertrochanteric femur fracture with lesser involvement.  Labs cbc Recent Labs    03/01/17 0359  WBC 19.5*  HGB 12.5  HCT 38.5  PLT 327    Labs inflam No results for input(s): CRP in the last 72 hours.  Invalid input(s): ESR  Labs coag Recent Labs    03/01/17 0359  INR 0.94    Recent Labs    03/01/17 0359  NA 135  K 4.4  CL 104  CO2 21*  GLUCOSE 177*  BUN 18  CREATININE 0.91  CALCIUM 8.7*     ASSESSMENT AND PLAN: 83 y.o. female with the following:  Right comminuted distal radius and intertrochanteric femur fracture  Orthopedics recommends admission to a medical service and we will provide consultation and follow along  Plan for cephalomedullary nail  to right hip and if doing well with anesthesia ORIF R wrist.  The risks benefits and alternatives were  discussed with the patient including but not limited to the risks of nonoperative treatment, versus surgical intervention including infection, bleeding, nerve injury, periprosthetic fracture, the need for revision surgery, leg length discrepancy, gait change, blood clots, cardiopulmonary complications, morbidity, mortality, among others, and they were willing to proceed.     - Weight Bearing Status/Activity: NWB now, likely WBAT RLE and WBAT R elbow, NWB R hand postop, check op note postop plan for updates  - Additional recommended labs/tests: None  -VTE Prophylaxis: Likely lovenox 14 days postop with transition to aspirin  - Pain control: PRN meds postop  - Follow-up plan: 2 weeks postop for xrays in clinic  -Procedures: as above.

## 2017-03-01 NOTE — Progress Notes (Signed)
Orthopedic Tech Progress Note Patient Details:  [name] Mar 10, 1930 [id number]  Patient ID: [name], female   DOB: Jun 24, 1929, 83 y.o.   MRN: [id number] Pt cant have ohf due to age restrictions  Karolee Stamps 03/01/2017, 11:07 PM

## 2017-03-01 NOTE — Op Note (Addendum)
Orthopaedic Surgery Operative Note (CSN: [id number])  [name]  09-Nov-1929 Date of Surgery: 03/01/2017   Diagnoses:  Right displaced 3 part intertrochanteric femur fracture Right comminuted displaced distal radius fracture  Procedure: Cephalomedullary nail Right intertrochanteric femur fracture 27245 Right 3 part intraarticular distal radius ORIF 82423   Operative Finding Successful completion of planned procedure.  Bone quality poor in both locations.  Significant comminution at both locations.  Crutch used to obtain adequate reduction of the hip.  Good fixation noted of both locations at the end of the procedure.  All absorbable sutures used.  Post-operative plan: The patient will be weightbearing as tolerated right lower extremity, weightbearing as tolerated right elbow, nonweightbearing right wrist.  The patient will be admitted to the hospitalist service.  DVT prophylaxis Lovenox starting postop day 1 for the first 14 days postop.  Pain control with PRN pain medication preferring oral medicines.  Follow up plan will be scheduled in approximately 10-14 days for wound check.  Post-Op Diagnosis: Same Surgeons:Primary: Hiram Gash, MD Assistants: Tawanna Cooler PA-C Location: [facility] OR ROOM 07 Anesthesia: General Antibiotics: Ancef 2g preop Tourniquet time: 40 min RUE Estimated Blood Loss: 536RW total Complications: None Specimens: None Implants: Implant Name Type Inv. Item Serial No. Manufacturer Lot No. LRB No. Used Action  KIT NAIL LONG [id number] - [id number] Nail KIT NAIL LONG [id number]  STRYKER TRAUMA [id number] Right 1 Implanted  SCREW LAG GAMMA 3 TI 10.5X90MM - [id number] Screw SCREW LAG GAMMA 3 TI 10.5X90MM  STRYKER TRAUMA [id number] Right 1 Implanted  SCREW LOCKING T2 F/T  5MMX40MM - [id number] Screw SCREW LOCKING T2 F/T  5MMX40MM  STRYKER TRAUMA [id number] Right 1 Implanted  PLATE NARROW DVR RIGHT - [id number] Plate PLATE NARROW DVR RIGHT  ZIMMER CAROLINAS  Right 1 Implanted   PEG LOCKING SMOOTH 2.2X18 - [id number] Peg PEG LOCKING SMOOTH 2.2X18  ZIMMER CAROLINAS  Right 1 Implanted  SCREW MULTI DIRECTIONAL 2.7X20 - [id number] Screw SCREW MULTI DIRECTIONAL 2.7X20  ZIMMER CAROLINAS  Right 1 Implanted  SCREW NONLOCK 2.7X18MM - [id number] Screw SCREW NONLOCK 2.7X18MM  ZIMMER CAROLINAS  Right 1 Implanted  SCREW LOCKING 2.7X12MM - [id number] Screw SCREW LOCKING 2.7X12MM  ZIMMER CAROLINAS  Right 3 Implanted  SCREW LOCKING 2.7X18 - [id number] Screw SCREW LOCKING 2.7X18  ZIMMER CAROLINAS  Right 1 Implanted  SCREW LOCKING 2.7X20MM - [id number] Screw SCREW LOCKING 2.7X20MM  ZIMMER CAROLINAS  Right 1 Implanted  SCREW LOCKING 2.7X22MM - [id number] Screw SCREW LOCKING 2.7X22MM  ZIMMER CAROLINAS  Right 2 Implanted  SCREW LOCKING 2.7X13MM - [id number] Screw SCREW LOCKING 2.7X13MM  ZIMMER CAROLINAS  Right 1 Implanted    Indications for Surgery:   [name] is a 83 y.o. female with fall while attempting to ambulate this morning resulting in the above fractures.  Patient was indicated for right hip ORIF as she was household ambulator with assistive device at baseline and the outcomes with nonoperative management of her fracture would be poor.  In the setting of needing to go to the operating room for her hip we talked her about fixation of her wrist as well and she elected to proceed.  Benefits and risks of operative and nonoperative management were discussed prior to surgery with patient/guardian(s) and informed consent form was completed.  Specific risks including infection, need for additional surgery, pain in the locations postop, possibility of hardware removal as needed.   Procedure:   The patient was identified in the preoperative holding area where the surgical site was marked. The patient  was taken to the OR where a procedural timeout was called and the above noted anesthesia was induced.  The patient was positioned supine on a fracture table for the entire surgery, arm on arm board  for wrist portion of case.  Preoperative antibiotics were dosed.  The patient's right hip and wrist were prepped and draped in the usual sterile fashion.  A second preoperative timeout was called.      The patient was placed supine on a fracture table and appropriate reduction was obtained and visualized on fluoroscopy prior to the beginning of the procedure.  We used a crutch to elevate the proximal aspect of the femoral shaft distal to the fracture.  Significant comminution noted at the fracture site and we took great care to make sure that the fracture was aligned appropriately prior to starting.  We made an incision proximal to the greater trochanter and dissected down through the fascia.  We then carefully placed our starting pin with an awl localizing under fluoroscopy prior to advancing the awl into the bone and sliding a ball-tipped guidewire through the awl into the femoral canal.  The wire was passed to an appropriate level at the fascial scar of the distal femur and measurement was obtained proximally using fluoroscopy.  We selected a length of nail noted above.  We determined that the canal was patulous and did not require reaming.  Utilized and all had an entry reamer rasp thus entry reaming was not necessary.  At this point we placed our nail localizing under fluoroscopy that it was at the appropriate level.  Was noted that are neck fragment was in excessive valgus and was somewhat displaced so we placed a blunt bone hook over the anterior surface of the femur taking great care to avoid interfering with any soft tissues and were able to hold the fragment reduced while proceeding with the case. We then used the outrigger device to pass a wire and then the cephalo-medullary screw.  The screw was locked proximally to avoid over collapse.  We took final shots at the proximal femur and then used perfect circle technique to place one distal interlock screw.  Final pictures were obtained.  The  incisions were thoroughly irrigated closed in a multilayer fashion with absorbable suture.  A sterile dressing was placed.   We turned our attention to the wrist at this point.  It was prepped and draped in a sterile fashion and another timeout was called.  An FCR approach was made exposing the volar surface of the distal radius taking care to go through the sheath of the FCR tendon tract and ulnarly exposing the inferior portion of the sheath while protecting the median nerve and radial artery on each side with blunt retractors.  This inferior portion of the sheath was incised sharply and examined for presence of the palmar cutaneous branch of the median nerve.  It was determined to not be within the field and we carried our dissection deeply to the bone splitting the pronator quadratus and exposing the fracture site.    Appropriate reduction was obtained and a DVR Biomet plate was placed and checked for sizing and reduction under fluoroscopy.  This reduction was held in place with K wires and a K wire was placed into the radial styloid.    Once appropriate reduction was confirmed we then proceeded to fix the plate proximally and then proceeded to fill the distal holes with a combination of partially threaded screws and pegs.  At  this point we checked our reduction to ensure that there was no intra-articular extension of our screws.  There was significant comminution so multidirectional screws were used on the styloid and so that we could place the narrow plate in a position that it caputured the lunate facet fragment in addition to the remainder of the fragments. Once this was confirmed we proceeded to fill remaining  proximal shaft screws and obtained final images which demonstrated appropriate reduction and maintenance of alignment.  The DRUJ was checked and found to be stable.  We verified that all fast guides were removed on XR and through count.    The wound was thoroughly irrigated.  The tourniquet  was released prior to skin closure to verify there was no excessive bleeding and we visualized that the radial artery and median nerve were intact at the end of the case. The PQ was reapproximated grossly prior to skin closure.    Skin was closed with absorbable suture and a volar slab splint was placed leaving the elbow free.  The patient was awoken from general anesthesia and taken to the PACU in stable condition without complication.     Tawanna Cooler PA-C, present and scrubbed throughout the case, critical for completion in a timely fashion, and for retraction, instrumentation, closure.

## 2017-03-01 NOTE — ED Triage Notes (Signed)
Patient got up to go to the bathroom lost her balance and fell landing on the hardwood floor on her right side. C/o pain in her right hip and right wrist. Shortening and outward rotation to right leg. Positive right pedal pulse. Denies loc

## 2017-03-01 NOTE — Anesthesia Preprocedure Evaluation (Signed)
Anesthesia Evaluation  Patient identified by MRN, date of birth, ID band Patient awake    Reviewed: Allergy & Precautions, H&P , Patient's Chart, lab work & pertinent test results, reviewed documented beta blocker date and time   Airway Mallampati: II  TM Distance: >3 FB Neck ROM: full    Dental no notable dental hx.    Pulmonary former smoker,    Pulmonary exam normal breath sounds clear to auscultation       Cardiovascular  Rhythm:regular Rate:Normal     Neuro/Psych    GI/Hepatic   Endo/Other  diabetes  Renal/GU      Musculoskeletal   Abdominal   Peds  Hematology   Anesthesia Other Findings   Reproductive/Obstetrics                             Anesthesia Physical Anesthesia Plan  ASA: II  Anesthesia Plan: General   Post-op Pain Management:    Induction: Intravenous  PONV Risk Score and Plan: 1 and Treatment may vary due to age or medical condition and Ondansetron  Airway Management Planned: Oral ETT  Additional Equipment:   Intra-op Plan:   Post-operative Plan: Extubation in OR  Informed Consent: I have reviewed the patients History and Physical, chart, labs and discussed the procedure including the risks, benefits and alternatives for the proposed anesthesia with the patient or authorized representative who has indicated his/her understanding and acceptance.   Dental Advisory Given  Plan Discussed with: CRNA and Surgeon  Anesthesia Plan Comments: (  )        Anesthesia Quick Evaluation

## 2017-03-01 NOTE — Consult Note (Signed)
ORTHOPAEDIC CONSULTATION  REQUESTING PHYSICIAN: Waldemar Dickens, MD  Chief Complaint: Right wrist and hip pain  HPI: [name] is a 83 y.o. female who complains of  Pain in the right wrist and hip after a fall this morning going to the bathroom at home.  She lives at home with her son and uses a walker at baseline but did not use it to try and get to the bathroom this morning.  No other injuries.  Orthopedics consulted for management options.    Past Medical History:  Diagnosis Date  . Abdominal pain, epigastric   . Anxiety   . Corns and callosities   . Diabetes mellitus   . Diaphragmatic hernia without mention of obstruction or gangrene   . Diverticulosis of colon (without mention of hemorrhage) 2009  . Fibromyalgia   . GERD (gastroesophageal reflux disease)   . Hiatal hernia   . History of colon polyps 2009   Hyperplastic  . Hx of adenomatous colonic polyps 2009  . Hyperlipidemia   . IBS (irritable bowel syndrome)   . Internal hemorrhoids without mention of complication   . Rash and other nonspecific skin eruption   . Status post dilation of esophageal narrowing   . Thyroid disease   . Type II or unspecified type diabetes mellitus with neurological manifestations, not stated as uncontrolled(250.60)    Past Surgical History:  Procedure Laterality Date  . ABDOMINAL HYSTERECTOMY    . BASAL CELL CARCINOMA EXCISION  04/2016   corner of left eye  . bmd  2007  . CATARACT EXTRACTION    . ESOPHAGOGASTRODUODENOSCOPY  2007  . HERNIA REPAIR    . LAPAROSCOPIC NISSEN FUNDOPLICATION  3009  . THYROIDECTOMY, PARTIAL     Social History   Socioeconomic History  . Marital status: Widowed    Spouse name: None  . Number of children: 3  . Years of education: None  . Highest education level: None  Social Needs  . Financial resource strain: None  . Food insecurity - worry: None  . Food insecurity - inability: None  . Transportation needs - medical: None  .  Transportation needs - non-medical: None  Occupational History  . Occupation: retired    Fish farm manager: RETIRED  Tobacco Use  . Smoking status: Former Smoker    Packs/day: 0.25    Years: 4.00    Pack years: 1.00    Types: Cigarettes    Last attempt to quit: 03/10/1981    Years since quitting: 36.0  . Smokeless tobacco: Never Used  Substance and Sexual Activity  . Alcohol use: No    Alcohol/week: 0.0 oz  . Drug use: No  . Sexual activity: No  Other Topics Concern  . None  Social History Narrative   Widowed   3 children         Family History  Problem Relation Age of Onset  . Uterine cancer Mother   . Cancer Mother   . Diabetes Maternal Grandfather   . Diabetes Son   . Hyperlipidemia Son   . Heart disease Paternal Grandmother   . Breast cancer Paternal Aunt   . Colon cancer Neg Hx   . Stomach cancer Neg Hx    Allergies  Allergen Reactions  . Codeine     Rapid heart rate  . Metformin     unspecified  . Penicillins     unspecified  . Pioglitazone     REACTION: rash  . Fenofibrate Palpitations   Prior  to Admission medications   Medication Sig Start Date End Date Taking? Authorizing Provider  aspirin 81 MG tablet Take 81 mg by mouth daily.     Yes [provider]  calcium carbonate (OS-CAL) 1250 (500 Ca) MG chewable tablet Chew 1 tablet by mouth daily.   Yes [provider]  insulin aspart (NOVOLOG FLEXPEN) 100 UNIT/ML FlexPen Inject 10 Units into the skin 3 (three) times daily with meals. 09/26/16  Yes Bedsole, Amy E, MD  insulin NPH Human (HUMULIN N,NOVOLIN N) 100 UNIT/ML injection Inject 15 Units into the skin at bedtime.    Yes [provider]  lisinopril (PRINIVIL,ZESTRIL) 5 MG tablet TAKE ONE TABLET BY MOUTH ONCE DAILY 09/13/16  Yes Bedsole, Amy E, MD  omeprazole (PRILOSEC) 20 MG capsule Take 20 mg by mouth 2 (two) times daily before a meal.   Yes [provider]  alendronate (FOSAMAX) 70 MG tablet Take 1 tablet (70 mg total) by  mouth every 7 (seven) days. Take with a full glass of water on an empty stomach. Patient not taking: Reported on 03/01/2017 07/22/16   Jinny Sanders, MD  pantoprazole (PROTONIX) 40 MG tablet Take 1 tablet (40 mg total) by mouth daily. Patient not taking: Reported on 03/01/2017 07/22/16   Jinny Sanders, MD   Dg Chest 1 View  Result Date: 03/01/2017 CLINICAL DATA:  [place] this morning. EXAM: CHEST 1 VIEW COMPARISON:  04/20/2012 FINDINGS: Cardiac enlargement. No vascular congestion or edema. Linear fibrosis or atelectasis in the lung bases. No focal consolidation. No blunting of costophrenic angles. No pneumothorax. Calcification of the aorta. Degenerative changes in the spine and shoulders. IMPRESSION: Fibrosis or atelectasis in the lung bases.  No focal consolidation. Electronically Signed   By: Lucienne Capers M.D.   On: 03/01/2017 05:04   Dg Wrist Complete Right  Result Date: 03/01/2017 CLINICAL DATA:  Fall with right wrist pain EXAM: RIGHT WRIST - COMPLETE 3+ VIEW COMPARISON:  None. FINDINGS: There is a impacted, comminuted fracture of the distal right radius. There is a minimally displaced fracture of the right ulnar styloid. There is moderate circumferential soft tissue swelling. No carpal fracture. IMPRESSION: Impacted, comminuted fracture of the distal right radius and minimally displaced fracture of the ulnar styloid. Electronically Signed   By: Ulyses Jarred M.D.   On: 03/01/2017 05:01   Dg Hand Complete Right  Result Date: 03/01/2017 CLINICAL DATA:  Fall EXAM: RIGHT HAND - COMPLETE 3+ VIEW COMPARISON:  None. FINDINGS: The bones are osteopenic. There is no fracture or dislocation of the bones of the right hand. Fracture of the right wrist is characterized more completely on the dedicated wrist study. No advanced osteoarthrosis. No erosions. IMPRESSION: Osteopenia without acute fracture or dislocation of the right hand. Electronically Signed   By: Ulyses Jarred M.D.   On: 03/01/2017 04:59    Dg Hip Unilat W Or Wo Pelvis 2-3 Views Right  Result Date: 03/01/2017 CLINICAL DATA:  Lateral right hip pain after a fall this morning. EXAM: DG HIP (WITH OR WITHOUT PELVIS) 2-3V RIGHT COMPARISON:  None. FINDINGS: Comminuted inter trochanteric fractures of the proximal right femur with varus angulation of the fracture fragments. Displacement of greater and lesser trochanteric fragments. No dislocation at the hip joint. Degenerative changes in the lower lumbar spine and in both hips. Pelvis appears intact. SI joints and symphysis pubis are not displaced. Prominent vascular calcifications. IMPRESSION: Acute comminuted inter trochanteric fractures of the proximal right femur with varus angulation. Electronically Signed  By: Lucienne Capers M.D.   On: 03/01/2017 05:03   Family History Reviewed and non-contributory, no pertinent history of problems with bleeding or anesthesia      Review of Systems 14 system ROS conducted and negative except for that noted in HPI   OBJECTIVE  Vitals: Patient Vitals for the past 8 hrs:  BP Temp Temp src Pulse Resp SpO2 Height Weight  03/01/17 0715 135/85 - - (!) 105 (!) 22 94 % - -  03/01/17 0700 (!) 149/77 - - (!) 106 17 94 % - -  03/01/17 0630 128/73 - - 100 17 93 % - -  03/01/17 0600 (!) 150/73 - - (!) 105 18 94 % - -  03/01/17 0545 131/73 - - 100 19 93 % - -  03/01/17 0530 (!) 145/77 - - 98 20 96 % - -  03/01/17 0400 (!) 163/82 - - 93 19 95 % - -  03/01/17 0354 - - - - - - 5' 3.5" (1.613 m) 67.1 kg (148 lb)  03/01/17 0353 (!) 180/89 98.9 F (37.2 C) Temporal 97 16 97 % - -   General: Alert, no acute distress Cardiovascular: No pedal edema Respiratory: No cyanosis, no use of accessory musculature GI: No organomegaly, abdomen is soft and non-tender Skin: No lesions in the area of chief complaint other than those listed below in MSK exam.  Neurologic: Sensation intact distally save for the below mentioned MSK exam Psychiatric: Patient is competent  for consent with normal mood and affect Lymphatic: No axillary or cervical lymphadenopathy Extremities  JHE:RDEYCX in place.  WWP fingers, +ain/pin/ulnar motor function.  Sensation preserved throughout hand.   LUE:No swelling, deformity, or effusion. Skin intact. Nontender to palpation, with full and painless ROM throughout hand with [facility] of 0. + Motor in  AIN, PIN, Ulnar distributions. Sensation intact in medial, radial, and ulnar distributions. Negative tinel at carpal tunnel.  2+ radial pulse with warm and well perfused digits. Compartments soft and compressible.  KGY:JEHUDJSHF and externally rotated.  ROM deferred. + GS/TA/EHL. Sensation intact in DP/SP/S/S/P distributions. 2+ DP pulse with warm and well perfused digits. Compartments soft and compressible, with no pain on passive stretch. LLE:No swelling, deformity, or effusion. Skin intact. Nontender to palpation, with full and painless ROM throughout. + GS/TA/EHL. Sensation intact in DP/SP/S/S/P distributions. 2+ DP pulse with warm and well perfused digits. Compartments soft and compressible, with no pain on passive stretch.     Test Results Imaging XR of the wrist and hip reviewed.  Comminuted displaced distal radius fracture noted as well as Comminuted right intertrochanteric femur fracture with lesser involvement.  Labs cbc Recent Labs    03/01/17 0359  WBC 19.5*  HGB 12.5  HCT 38.5  PLT 327    Labs inflam No results for input(s): CRP in the last 72 hours.  Invalid input(s): ESR  Labs coag Recent Labs    03/01/17 0359  INR 0.94    Recent Labs    03/01/17 0359  NA 135  K 4.4  CL 104  CO2 21*  GLUCOSE 177*  BUN 18  CREATININE 0.91  CALCIUM 8.7*     ASSESSMENT AND PLAN: 83 y.o. female with the following:  Right comminuted distal radius and intertrochanteric femur fracture  Orthopedics recommends admission to a medical service and we will provide consultation and follow along  Plan for cephalomedullary nail  to right hip and if doing well with anesthesia ORIF R wrist.  The risks benefits and alternatives were  discussed with the patient including but not limited to the risks of nonoperative treatment, versus surgical intervention including infection, bleeding, nerve injury, periprosthetic fracture, the need for revision surgery, leg length discrepancy, gait change, blood clots, cardiopulmonary complications, morbidity, mortality, among others, and they were willing to proceed.     - Weight Bearing Status/Activity: NWB now, likely WBAT RLE and WBAT R elbow, NWB R hand postop, check op note postop plan for updates  - Additional recommended labs/tests: None  -VTE Prophylaxis: Likely lovenox 14 days postop with transition to aspirin  - Pain control: PRN meds postop  - Follow-up plan: 2 weeks postop for xrays in clinic  -Procedures: as above.

## 2017-03-01 NOTE — Plan of Care (Signed)
  [name] is a 83 year old female with pmh of DM type 2, HTN, HLD; who presents after having a mechanical fall onto her right side.  No trauma to the head or loss of consciousness reported.  Imaging reveals acute right intertrochanteric hip fracture as well as a right distal radius fracture that is impacted and communicated.  Patient's right wrist was splinted. Dr. Percell Miller of orthopedics on call, and pt will be evaluate patient for repair in a.m.  Admitted as inpatient to a MedSurg bed.

## 2017-03-01 NOTE — Interval H&P Note (Signed)
History and Physical Interval Note:  03/01/2017 8:13 AM  [name]  has presented today for surgery, with the diagnosis of right hip fracture  The various methods of treatment have been discussed with the patient and family. After consideration of risks, benefits and other options for treatment, the patient has consented to  Procedure(s): INTRAMEDULLARY (IM) NAIL INTERTROCHANTRIC (Right) OPEN REDUCTION INTERNAL FIXATION (ORIF) WRIST FRACTURE (Right) as a surgical intervention .  The patient's history has been reviewed, patient examined, no change in status, stable for surgery.  I have reviewed the patient's chart and labs.  Questions were answered to the patient's satisfaction.     Hiram Gash

## 2017-03-01 NOTE — Anesthesia Procedure Notes (Signed)
Procedure Name: Intubation Date/Time: 03/01/2017 8:41 AM Performed by: Colin Benton, CRNA Pre-anesthesia Checklist: Patient identified, Emergency Drugs available, Suction available and Patient being monitored Patient Re-evaluated:Patient Re-evaluated prior to induction Oxygen Delivery Method: Circle system utilized Preoxygenation: Pre-oxygenation with 100% oxygen Induction Type: IV induction Ventilation: Mask ventilation without difficulty and Oral airway inserted - appropriate to patient size Laryngoscope Size: Sabra Heck and 2 Grade View: Grade I Tube type: Oral Tube size: 7.0 mm Number of attempts: 1 Airway Equipment and Method: Stylet Placement Confirmation: ETT inserted through vocal cords under direct vision,  positive ETCO2 and breath sounds checked- equal and bilateral Secured at: 21 cm Tube secured with: Tape Dental Injury: Teeth and Oropharynx as per pre-operative assessment

## 2017-03-01 NOTE — Anesthesia Postprocedure Evaluation (Signed)
Anesthesia Post Note  Patient: [name]  Procedure(s) Performed: INTRAMEDULLARY (IM) NAIL INTERTROCHANTRIC (Right Hip) OPEN REDUCTION INTERNAL FIXATION (ORIF) WRIST FRACTURE (Right Wrist)     Patient location during evaluation: PACU Anesthesia Type: General Level of consciousness: awake and alert Pain management: pain level controlled Vital Signs Assessment: post-procedure vital signs reviewed and stable Respiratory status: spontaneous breathing, nonlabored ventilation, respiratory function stable and patient connected to nasal cannula oxygen Cardiovascular status: blood pressure returned to baseline and stable Postop Assessment: no apparent nausea or vomiting Anesthetic complications: no    Last Vitals:  Vitals:   03/01/17 1410 03/01/17 1415  BP:    Pulse: (!) 112 (!) 111  Resp: 17 18  Temp:    SpO2: 97% 96%    Last Pain:  Vitals:   03/01/17 1315  TempSrc:   PainSc: Asleep                 Allante Beane EDWARD

## 2017-03-01 NOTE — H&P (Signed)
History and Physical    [name] JJO:[id number] DOB: 10-16-1929 DOA: 03/01/2017   PCP: Jinny Sanders, MD   Patient coming from:  Home    Chief Complaint: Mechanical fall  HPI: [name] is a 83 y.o. female with medical history significant for diabetes, hypertension, hyperlipidemia, anxiety, GERD, hypothyroidism, presenting, after sustaining a mechanical fall right asleep.  She also had right wrist pain and some swelling.  Patient reports trying to go to the bathroom, when she lost balance, with no loss of consciousness, or hitting her head.  She denies any dizziness, presyncope or syncope.  The patient denies any history of seizures.  She denies any chest pain, palpitations, or shortness of breath or cough.  She denies any abdominal pain, nausea or vomiting.  Denies any focal weakness, numbness or tingling.  The patient was immobilized, and a splint was placed on her right wrist as well.  Orthopedics was contacted by the ED P, and after x-rays demonstrated a right close hip fracture, she is to be admitted, and undergo surgery this morning.  ED Course:  BP 135/85   Pulse (!) 105   Temp 98.9 F (37.2 C) (Temporal)   Resp (!) 22   Ht 5' 3.5" (1.613 m)   Wt 67.1 kg (148 lb)   SpO2 94%   BMI 25.81 kg/m   EKG, sinus rhythm, no ACS White count 19.5, ANC 15.2 Bicarb 21 Glucose 177 Calcium 8.7 Right hip x-ray shows acute comminuted intertrochanteric fracture of the proximal right femur with valgus angulation. Chest x-ray negative for acute disease Right wrist shows impacted, comminuted fracture of the distal right radius, with minimally displaced fracture of the ulnar styloid.  And surgery was contacted by EDP. Patient has been receiving fentanyl IV with some relief  Review of Systems:  As per HPI otherwise all other systems reviewed and are negative  Past Medical History:  Diagnosis Date  . Abdominal pain, epigastric   . Anxiety   . Corns and callosities   . Diabetes  mellitus   . Diaphragmatic hernia without mention of obstruction or gangrene   . Diverticulosis of colon (without mention of hemorrhage) 2009  . Fibromyalgia   . GERD (gastroesophageal reflux disease)   . Hiatal hernia   . History of colon polyps 2009   Hyperplastic  . Hx of adenomatous colonic polyps 2009  . Hyperlipidemia   . IBS (irritable bowel syndrome)   . Internal hemorrhoids without mention of complication   . Rash and other nonspecific skin eruption   . Status post dilation of esophageal narrowing   . Thyroid disease   . Type II or unspecified type diabetes mellitus with neurological manifestations, not stated as uncontrolled(250.60)     Past Surgical History:  Procedure Laterality Date  . ABDOMINAL HYSTERECTOMY    . BASAL CELL CARCINOMA EXCISION  04/2016   corner of left eye  . bmd  2007  . CATARACT EXTRACTION    . ESOPHAGOGASTRODUODENOSCOPY  2007  . HERNIA REPAIR    . LAPAROSCOPIC NISSEN FUNDOPLICATION  1601  . THYROIDECTOMY, PARTIAL      Social History Social History   Socioeconomic History  . Marital status: Widowed    Spouse name: Not on file  . Number of children: 3  . Years of education: Not on file  . Highest education level: Not on file  Social Needs  . Financial resource strain: Not on file  . Food insecurity - worry: Not on file  .  Food insecurity - inability: Not on file  . Transportation needs - medical: Not on file  . Transportation needs - non-medical: Not on file  Occupational History  . Occupation: retired    Fish farm manager: RETIRED  Tobacco Use  . Smoking status: Former Smoker    Packs/day: 0.25    Years: 4.00    Pack years: 1.00    Types: Cigarettes    Last attempt to quit: 03/10/1981    Years since quitting: 36.0  . Smokeless tobacco: Never Used  Substance and Sexual Activity  . Alcohol use: No    Alcohol/week: 0.0 oz  . Drug use: No  . Sexual activity: No  Other Topics Concern  . Not on file  Social History Narrative   Widowed     3 children           Allergies  Allergen Reactions  . Codeine     Rapid heart rate  . Metformin     unspecified  . Penicillins     unspecified  . Pioglitazone     REACTION: rash  . Fenofibrate Palpitations    Family History  Problem Relation Age of Onset  . Uterine cancer Mother   . Cancer Mother   . Diabetes Maternal Grandfather   . Diabetes Son   . Hyperlipidemia Son   . Heart disease Paternal Grandmother   . Breast cancer Paternal Aunt   . Colon cancer Neg Hx   . Stomach cancer Neg Hx       Prior to Admission medications   Medication Sig Start Date End Date Taking? Authorizing Provider  aspirin 81 MG tablet Take 81 mg by mouth daily.     Yes [provider]  calcium carbonate (OS-CAL) 1250 (500 Ca) MG chewable tablet Chew 1 tablet by mouth daily.   Yes [provider]  insulin aspart (NOVOLOG FLEXPEN) 100 UNIT/ML FlexPen Inject 10 Units into the skin 3 (three) times daily with meals. 09/26/16  Yes Bedsole, Amy E, MD  insulin NPH Human (HUMULIN N,NOVOLIN N) 100 UNIT/ML injection Inject 15 Units into the skin at bedtime.    Yes [provider]  lisinopril (PRINIVIL,ZESTRIL) 5 MG tablet TAKE ONE TABLET BY MOUTH ONCE DAILY 09/13/16  Yes Bedsole, Amy E, MD  omeprazole (PRILOSEC) 20 MG capsule Take 20 mg by mouth 2 (two) times daily before a meal.   Yes [provider]  alendronate (FOSAMAX) 70 MG tablet Take 1 tablet (70 mg total) by mouth every 7 (seven) days. Take with a full glass of water on an empty stomach. Patient not taking: Reported on 03/01/2017 07/22/16   Jinny Sanders, MD  pantoprazole (PROTONIX) 40 MG tablet Take 1 tablet (40 mg total) by mouth daily. Patient not taking: Reported on 03/01/2017 07/22/16   Jinny Sanders, MD    Physical Exam:  Vitals:   03/01/17 0600 03/01/17 0630 03/01/17 0700 03/01/17 0715  BP: (!) 150/73 128/73 (!) 149/77 135/85  Pulse: (!) 105 100 (!) 106 (!) 105  Resp: 18 17 17  (!) 22  Temp:       TempSrc:      SpO2: 94% 93% 94% 94%  Weight:      Height:       Constitutional: NAD, calm, uncomfortable due to pain. Eyes: PERRL, lids and conjunctivae normal ENMT: Mucous membranes are moist, without exudate or lesions  Neck: normal, supple, no masses, no thyromegaly Respiratory: clear to auscultation bilaterally, no wheezing, no crackles. Normal respiratory effort  Cardiovascular:  Regular rate and rhythm, 2-3 out of 6 high-pitched murmur, bo rubs or gallops. No extremity edema. 2+ pedal pulses. No carotid bruits.  Abdomen: Soft, non tender, No hepatosplenomegaly. Bowel sounds positive.  Musculoskeletal: R hip tenderness, immobilized. R leg externally rotated, able to move toes. R wrist and hand with some swelling, intact ROM and pulses.  Patient has a splint on the right wrist Skin: no jaundice, No open lesions.  Neurologic: Sensation intact  Strength equal in all mobile extremities, unable to assess immobilized areas. Psychiatric:   Alert and oriented x 3. Normal mood.     Labs on Admission: I have personally reviewed following labs and imaging studies  CBC: Recent Labs  Lab 03/01/17 0359  WBC 19.5*  NEUTROABS 15.2*  HGB 12.5  HCT 38.5  MCV 81.6  PLT 478    Basic Metabolic Panel: Recent Labs  Lab 03/01/17 0359  NA 135  K 4.4  CL 104  CO2 21*  GLUCOSE 177*  BUN 18  CREATININE 0.91  CALCIUM 8.7*    GFR: Estimated Creatinine Clearance: 42.1 mL/min (by C-G formula based on SCr of 0.91 mg/dL).  Liver Function Tests: No results for input(s): AST, ALT, ALKPHOS, BILITOT, PROT, ALBUMIN in the last 168 hours. No results for input(s): LIPASE, AMYLASE in the last 168 hours. No results for input(s): AMMONIA in the last 168 hours.  Coagulation Profile: Recent Labs  Lab 03/01/17 0359  INR 0.94    Cardiac Enzymes: No results for input(s): CKTOTAL, CKMB, CKMBINDEX, TROPONINI in the last 168 hours.  BNP (last 3 results) No results for input(s): PROBNP in the last  8760 hours.  HbA1C: No results for input(s): HGBA1C in the last 72 hours.  CBG: Recent Labs  Lab 03/01/17 0820  GLUCAP 200*    Lipid Profile: No results for input(s): CHOL, HDL, LDLCALC, TRIG, CHOLHDL, LDLDIRECT in the last 72 hours.  Thyroid Function Tests: No results for input(s): TSH, T4TOTAL, FREET4, T3FREE, THYROIDAB in the last 72 hours.  Anemia Panel: No results for input(s): VITAMINB12, FOLATE, FERRITIN, TIBC, IRON, RETICCTPCT in the last 72 hours.  Urine analysis:    Component Value Date/Time   COLORURINE yellow 08/11/2009 0000   APPEARANCEUR Cloudy 08/11/2009 0000   LABSPEC <1.005 08/11/2009 0000   PHURINE 7.0 08/11/2009 0000   GLUCOSEU NEGATIVE 05/31/2007 2017   HGBUR negative 08/11/2009 0000   BILIRUBINUR negative 11/04/2014 1033   KETONESUR NEGATIVE 05/31/2007 2017   PROTEINUR negative 11/04/2014 1033   PROTEINUR NEGATIVE 05/31/2007 2017   UROBILINOGEN 0.2 11/04/2014 1033   UROBILINOGEN 0.2 08/11/2009 0000   NITRITE negative 11/04/2014 1033   NITRITE negative 08/11/2009 0000   LEUKOCYTESUR moderate (2+) (A) 11/04/2014 1033    Sepsis Labs: @LABRCNTIP (procalcitonin:4,lacticidven:4) )No results found for this or any previous visit (from the past 240 hour(s)).   Radiological Exams on Admission: Dg Chest 1 View  Result Date: 03/01/2017 CLINICAL DATA:  [place] this morning. EXAM: CHEST 1 VIEW COMPARISON:  04/20/2012 FINDINGS: Cardiac enlargement. No vascular congestion or edema. Linear fibrosis or atelectasis in the lung bases. No focal consolidation. No blunting of costophrenic angles. No pneumothorax. Calcification of the aorta. Degenerative changes in the spine and shoulders. IMPRESSION: Fibrosis or atelectasis in the lung bases.  No focal consolidation. Electronically Signed   By: Lucienne Capers M.D.   On: 03/01/2017 05:04   Dg Wrist Complete Right  Result Date: 03/01/2017 CLINICAL DATA:  Fall with right wrist pain EXAM: RIGHT WRIST - COMPLETE 3+ VIEW  COMPARISON:  None.  FINDINGS: There is a impacted, comminuted fracture of the distal right radius. There is a minimally displaced fracture of the right ulnar styloid. There is moderate circumferential soft tissue swelling. No carpal fracture. IMPRESSION: Impacted, comminuted fracture of the distal right radius and minimally displaced fracture of the ulnar styloid. Electronically Signed   By: Ulyses Jarred M.D.   On: 03/01/2017 05:01   Dg Hand Complete Right  Result Date: 03/01/2017 CLINICAL DATA:  Fall EXAM: RIGHT HAND - COMPLETE 3+ VIEW COMPARISON:  None. FINDINGS: The bones are osteopenic. There is no fracture or dislocation of the bones of the right hand. Fracture of the right wrist is characterized more completely on the dedicated wrist study. No advanced osteoarthrosis. No erosions. IMPRESSION: Osteopenia without acute fracture or dislocation of the right hand. Electronically Signed   By: Ulyses Jarred M.D.   On: 03/01/2017 04:59   Dg Hip Unilat W Or Wo Pelvis 2-3 Views Right  Result Date: 03/01/2017 CLINICAL DATA:  Lateral right hip pain after a fall this morning. EXAM: DG HIP (WITH OR WITHOUT PELVIS) 2-3V RIGHT COMPARISON:  None. FINDINGS: Comminuted inter trochanteric fractures of the proximal right femur with varus angulation of the fracture fragments. Displacement of greater and lesser trochanteric fragments. No dislocation at the hip joint. Degenerative changes in the lower lumbar spine and in both hips. Pelvis appears intact. SI joints and symphysis pubis are not displaced. Prominent vascular calcifications. IMPRESSION: Acute comminuted inter trochanteric fractures of the proximal right femur with varus angulation. Electronically Signed   By: Lucienne Capers M.D.   On: 03/01/2017 05:03    EKG: Independently reviewed.  Assessment/Plan Active Problems:   Closed right hip fracture (HCC)   Secondary DM with neurological manifestations ([place])   Diabetic peripheral neuropathy associated with  type 2 diabetes mellitus ([place])   Hyperlipidemia   GERD   Chronic constipation   Falls frequently     Right hip and right wrist fracture.  X-ray shows acute comminuted intertrochanteric fracture of the proximal right femur with varus angulation. In addition, the patient has impacted, comminuted fracture of the distal right radius, with minimally displaced fracture of the ulnar styloid, hand surgery has been contacted by EDP as well, a consult is pending.  White count was elevated in view of acute pain, otherwise rest of the CBC, and BMP is unremarkable . chest x-ray NAD, EKG without ACS, on sinus rhythm. R hip CT pending  The patient received IV pain meds without significant relief immobilized.   Admit to med surg, anticipating surgery today as per ORtho  NPO for now  SCDs IVF Pain control as per  PT/OT consult  Leukocytosis, likely reactive due to exquisite pain, inflammation  WBC 19   CXR unremarkable Afebrile. UA pending   IVF  Continue Ancef as per Ortho Will obtain blood cultures  Await UA results CBC in am   Type II Diabetes Current blood sugar level is 177 Lab Results  Component Value Date   HGBA1C 6.4 06/23/2016  Hgb A1C  SSI   Hypertension BP 135/85    Continue home anti-hypertensive medications     GERD, no acute symptoms Continue PPI  Chronic constipation, last BM yesterday   continue daily laxatives in view of need of pain meds.   DVT prophylaxis: SCD, Heparin tomorrow if no bleeding issues arise  Code Status:    Full Family Communication:  Discussed with patient Disposition Plan: Expect patient likely to be discharged to inpatient rehab after condition improves Consults  called:    Dr. Percell Miller, orthopedics, for right hip surgery this morning, also consulted by ED P is the hand surgery Admission status: Inpatient MedSurg   Sharene Butters, PA-C [facility]   03/01/2017, 8:30 AM

## 2017-03-01 NOTE — ED Notes (Signed)
Ortho aware of need for splint 

## 2017-03-01 NOTE — Plan of Care (Signed)
  Progressing Education: Knowledge of General Education information will improve 03/01/2017 1707 - Progressing by Harlin Heys, RN Health Behavior/Discharge Planning: Ability to manage health-related needs will improve 03/01/2017 1707 - Progressing by Harlin Heys, RN Clinical Measurements: Ability to maintain clinical measurements within normal limits will improve 03/01/2017 1707 - Progressing by Harlin Heys, RN Will remain free from infection 03/01/2017 1707 - Progressing by Harlin Heys, RN Diagnostic test results will improve 03/01/2017 1707 - Progressing by Harlin Heys, RN Respiratory complications will improve 03/01/2017 1707 - Progressing by Harlin Heys, RN Cardiovascular complication will be avoided 03/01/2017 1707 - Progressing by Harlin Heys, RN Activity: Risk for activity intolerance will decrease 03/01/2017 1707 - Progressing by Harlin Heys, RN Nutrition: Adequate nutrition will be maintained 03/01/2017 1707 - Progressing by Harlin Heys, RN Coping: Level of anxiety will decrease 03/01/2017 1707 - Progressing by Harlin Heys, RN Elimination: Will not experience complications related to bowel motility 03/01/2017 1707 - Progressing by Harlin Heys, RN Will not experience complications related to urinary retention 03/01/2017 1707 - Progressing by Harlin Heys, RN Pain Managment: General experience of comfort will improve 03/01/2017 1707 - Progressing by Harlin Heys, RN Safety: Ability to remain free from injury will improve 03/01/2017 1707 - Progressing by Harlin Heys, RN Skin Integrity: Risk for impaired skin integrity will decrease 03/01/2017 1707 - Progressing by Harlin Heys, RN

## 2017-03-01 NOTE — Transfer of Care (Signed)
Immediate Anesthesia Transfer of Care Note  Patient: [name]  Procedure(s) Performed: INTRAMEDULLARY (IM) NAIL INTERTROCHANTRIC (Right Hip) OPEN REDUCTION INTERNAL FIXATION (ORIF) WRIST FRACTURE (Right Wrist)  Patient Location: PACU  Anesthesia Type:General  Level of Consciousness: awake, alert , oriented and patient cooperative  Airway & Oxygen Therapy: Patient Spontanous Breathing and Patient connected to nasal cannula oxygen  Post-op Assessment: Report given to RN and Post -op Vital signs reviewed and stable  Post vital signs: Reviewed and stable  Last Vitals:  Vitals:   03/01/17 0700 03/01/17 0715  BP: (!) 149/77 135/85  Pulse: (!) 106 (!) 105  Resp: 17 (!) 22  Temp:    SpO2: 94% 94%    Last Pain:  Vitals:   03/01/17 0545  TempSrc:   PainSc: 10-Worst pain ever         Complications: No apparent anesthesia complications

## 2017-03-01 NOTE — Progress Notes (Signed)
PT Cancellation Note  Patient Details Name: [name] MRN: [id number] DOB: 01/11/1930   Cancelled Treatment:    Reason Eval/Treat Not Completed: Patient at procedure or test/unavailable. Pt in OR. PT will continue to f/u with pt as available.   [place] 03/01/2017, 10:34 AM

## 2017-03-01 NOTE — Discharge Instructions (Signed)
Weight bearing as tolerated right lower extremity.  Do not remove bandage.  Ok to let water trickle over bandages on the leg.  Ice and elevate for pain and swelling.  Take lovenox as prescribed to help prevent blood clots.   Right upper extremity:  Do not remove splint. Do not get splint wet.  Ice and elevate for pain and swelling.

## 2017-03-02 ENCOUNTER — Inpatient Hospital Stay (HOSPITAL_COMMUNITY): Payer: PPO

## 2017-03-02 LAB — CBC
HEMATOCRIT: 30.3 % — AB (ref 36.0–46.0)
HEMOGLOBIN: 9.7 g/dL — AB (ref 12.0–15.0)
MCH: 26.1 pg (ref 26.0–34.0)
MCHC: 32 g/dL (ref 30.0–36.0)
MCV: 81.5 fL (ref 78.0–100.0)
Platelets: 323 10*3/uL (ref 150–400)
RBC: 3.72 MIL/uL — ABNORMAL LOW (ref 3.87–5.11)
RDW: 15.2 % (ref 11.5–15.5)
WBC: 13.8 10*3/uL — AB (ref 4.0–10.5)

## 2017-03-02 LAB — PROTIME-INR
INR: 1.07
Prothrombin Time: 13.8 seconds (ref 11.4–15.2)

## 2017-03-02 LAB — COMPREHENSIVE METABOLIC PANEL
ALBUMIN: 2.6 g/dL — AB (ref 3.5–5.0)
ALK PHOS: 68 U/L (ref 38–126)
ALT: 12 U/L — AB (ref 14–54)
ANION GAP: 9 (ref 5–15)
AST: 21 U/L (ref 15–41)
BUN: 25 mg/dL — ABNORMAL HIGH (ref 6–20)
CALCIUM: 7.7 mg/dL — AB (ref 8.9–10.3)
CHLORIDE: 106 mmol/L (ref 101–111)
CO2: 21 mmol/L — AB (ref 22–32)
CREATININE: 1.05 mg/dL — AB (ref 0.44–1.00)
GFR calc non Af Amer: 47 mL/min — ABNORMAL LOW (ref >60)
GFR, EST AFRICAN AMERICAN: 55 mL/min — AB (ref >60)
GLUCOSE: 213 mg/dL — AB (ref 65–99)
Potassium: 5.2 mmol/L — ABNORMAL HIGH (ref 3.5–5.1)
SODIUM: 136 mmol/L (ref 135–145)
Total Bilirubin: 0.8 mg/dL (ref 0.3–1.2)
Total Protein: 5.6 g/dL — ABNORMAL LOW (ref 6.5–8.1)

## 2017-03-02 LAB — BASIC METABOLIC PANEL
ANION GAP: 8 (ref 5–15)
BUN: 21 mg/dL — ABNORMAL HIGH (ref 6–20)
CALCIUM: 7.7 mg/dL — AB (ref 8.9–10.3)
CO2: 21 mmol/L — ABNORMAL LOW (ref 22–32)
CREATININE: 0.94 mg/dL (ref 0.44–1.00)
Chloride: 103 mmol/L (ref 101–111)
GFR calc Af Amer: >60 mL/min (ref >60)
GFR, EST NON AFRICAN AMERICAN: 54 mL/min — AB (ref >60)
Glucose, Bld: 220 mg/dL — ABNORMAL HIGH (ref 65–99)
Potassium: 4.6 mmol/L (ref 3.5–5.1)
SODIUM: 132 mmol/L — AB (ref 135–145)

## 2017-03-02 LAB — GLUCOSE, CAPILLARY
GLUCOSE-CAPILLARY: 205 mg/dL — AB (ref 65–99)
GLUCOSE-CAPILLARY: 179 mg/dL — AB (ref 65–99)
Glucose-Capillary: 191 mg/dL — ABNORMAL HIGH (ref 65–99)
Glucose-Capillary: 214 mg/dL — ABNORMAL HIGH (ref 65–99)

## 2017-03-02 LAB — HEMOGLOBIN AND HEMATOCRIT, BLOOD
HCT: 27.5 % — ABNORMAL LOW (ref 36.0–46.0)
HEMOGLOBIN: 8.8 g/dL — AB (ref 12.0–15.0)

## 2017-03-02 NOTE — Evaluation (Signed)
Occupational Therapy Evaluation Patient Details Name: [name] MRN: [id number] DOB: May 23, 1929 Today's Date: 03/02/2017    History of Present Illness [name] a 83 y.o.femalewith medical history significantfor diabetes, hypertension, hyperlipidemia, anxiety, GERD, hypothyroidism, presenting after sustaining a mechanical fall onto her right hip. S/p IM intertrochanteric fixation of right hip, ORIF of right write 11/21.   Clinical Impression   PTA Pt mod I for ADL and RW, and got assist from son's girlfriend with IADL. Pt is currently mod A for BUE tasks, total A for LB ADL and max +2 to total A for bed mobility. Pt greatly limited by pain this session and is hyper sensitive to touch (especially on leg) Pt able to come to sitting EOB with max A +2 assist and "helicopter" strategy. Please see OT problem list below. Pt will require skilled OT in the acute setting and afterwards at SNF level to maximize safety and independence in ADL and functional transfers.    Follow Up Recommendations  SNF;Supervision/Assistance - 24 hour    Equipment Recommendations  Other (comment)(defer to next venue)    Recommendations for Other Services       Precautions / Restrictions Precautions Precautions: Fall Restrictions Weight Bearing Restrictions: Yes RUE Weight Bearing: Weight bear through elbow only RLE Weight Bearing: Weight bearing as tolerated Other Position/Activity Restrictions: no ROM at R wrist      Mobility Bed Mobility Overal bed mobility: Needs Assistance Bed Mobility: Supine to Sit;Sit to Supine     Supine to sit: Max assist;+2 for physical assistance;+2 for safety/equipment;HOB elevated Sit to supine: Total assist;+2 for physical assistance;+2 for safety/equipment;HOB elevated   General bed mobility comments: Pt very painful with bed mobility  Transfers                 General transfer comment: deferred at this time due to pain    Balance Overall  balance assessment: Needs assistance Sitting-balance support: Single extremity supported;Feet supported Sitting balance-Leahy Scale: Fair Sitting balance - Comments: initially mod A for seated balance, progressed to min guard with moments of no UE support Postural control: Left lateral lean(to accomodate for pain)                                 ADL either performed or assessed with clinical judgement   ADL Overall ADL's : Needs assistance/impaired                                             Vision Baseline Vision/History: Wears glasses Wears Glasses: At all times Patient Visual Report: No change from baseline Vision Assessment?: No apparent visual deficits     Perception     Praxis      Pertinent Vitals/Pain Pain Assessment: 0-10 Pain Score: 8  Pain Location: R leg and wrist Pain Descriptors / Indicators: Discomfort;Guarding;Grimacing;Moaning;Tender Pain Intervention(s): Monitored during session;Repositioned;Limited activity within patient's tolerance     Hand Dominance Right   Extremity/Trunk Assessment Upper Extremity Assessment Upper Extremity Assessment: Generalized weakness;RUE deficits/detail RUE Deficits / Details: wrist spint to prevent ROM at wrist, digit flexion/extension limited RUE: Unable to fully assess due to pain;Unable to fully assess due to immobilization RUE Coordination: decreased fine motor;decreased gross motor   Lower Extremity Assessment Lower Extremity Assessment: Defer to PT evaluation   Cervical / Trunk Assessment  Cervical / Trunk Assessment: Kyphotic   Communication Communication Communication: No difficulties   Cognition Arousal/Alertness: Awake/alert Behavior During Therapy: Anxious Overall Cognitive Status: Within Functional Limits for tasks assessed                                     General Comments  Pt's leg is hypersensitive    Exercises Exercises: Other exercises Other  Exercises Other Exercises: encourage ROM at digits, Elbow, shoulder as pain allows (educated that it will help with pain)   Shoulder Instructions      Home Living Family/patient expects to be discharged to:: Private residence Living Arrangements: Children(son and his girlfriend) Available Help at Discharge: Family;Available 24 hours/day Type of Home: House Home Access: Stairs to enter [employer] of Steps: 3 Entrance Stairs-Rails: Can reach both;Right;Left Home Layout: One level     Bathroom Shower/Tub: Teacher, early years/pre: Standard Bathroom Accessibility: Yes How Accessible: Accessible via walker Home Equipment: Walker - 2 wheels;Transport chair          Prior Functioning/Environment Level of Independence: Independent with assistive device(s)        Comments: used RW for ambulation, independent in ADL        OT Problem List: Decreased strength;Decreased range of motion;Decreased activity tolerance;Impaired balance (sitting and/or standing);Decreased safety awareness;Decreased knowledge of use of DME or AE;Decreased knowledge of precautions;Pain;Impaired UE functional use      OT Treatment/Interventions: Self-care/ADL training;Therapeutic activities;Therapeutic exercise;Energy conservation;DME and/or AE instruction;Patient/family education;Balance training    OT Goals(Current goals can be found in the care plan section) Acute Rehab OT Goals Patient Stated Goal: to decrease pain OT Goal Formulation: With patient Time For Goal Achievement: 03/16/17 Potential to Achieve Goals: Fair ADL Goals Pt Will Perform Grooming: with set-up;sitting Pt Will Perform Upper Body Bathing: with min guard assist;sitting Pt Will Perform Lower Body Bathing: with min guard assist;sitting/lateral leans Pt Will Transfer to Toilet: with mod assist;squat pivot transfer;bedside commode Pt Will Perform Toileting - Clothing Manipulation and hygiene: with max assist;sit  to/from stand;with caregiver independent in assisting Additional ADL Goal #1: Pt will perform bed mobility at Mod A level as precursor to participation in ADL activity  OT Frequency: Min 2X/week   Barriers to D/C:            Co-evaluation PT/OT/SLP Co-Evaluation/Treatment: Yes Reason for Co-Treatment: Complexity of the patient's impairments (multi-system involvement);To address functional/ADL transfers;For patient/therapist safety PT goals addressed during session: Mobility/safety with mobility OT goals addressed during session: ADL's and self-care      AM-PAC PT "6 Clicks" Daily Activity     Outcome Measure Help from another person eating meals?: A Little Help from another person taking care of personal grooming?: A Lot Help from another person toileting, which includes using toliet, bedpan, or urinal?: Total Help from another person bathing (including washing, rinsing, drying)?: Total Help from another person to put on and taking off regular upper body clothing?: A Lot Help from another person to put on and taking off regular lower body clothing?: Total 6 Click Score: 10   End of Session Equipment Utilized During Treatment: Oxygen Nurse Communication: Mobility status;Weight bearing status(RN checked, and meds not available until 4pm)  Activity Tolerance: Patient limited by pain Patient left: in bed;with call bell/phone within reach;with bed alarm set;with SCD's reapplied  OT Visit Diagnosis: Unsteadiness on feet (R26.81);Other abnormalities of gait and mobility (R26.89);Repeated falls (R29.6);History of falling (Z91.81);Muscle  weakness (generalized) (M62.81);Pain Pain - Right/Left: Right Pain - part of body: Arm;Hand;Leg                Time: 8264-1583 OT Time Calculation (min): 32 min Charges:  OT General Charges $OT Visit: 1 Visit OT Evaluation $OT Eval Moderate Complexity: 1 Mod G-Codes:     Hulda Humphrey OTR/L [place] 03/02/2017, 3:50 PM

## 2017-03-02 NOTE — Evaluation (Signed)
Physical Therapy Evaluation Patient Details Name: [name] MRN: [id number] DOB: Jul 15, 1929 Today's Date: 03/02/2017   History of Present Illness  [name] a 83 y.o.femalewith medical history significantfor diabetes, hypertension, hyperlipidemia, anxiety, GERD, hypothyroidism, presenting after sustaining a mechanical fall onto her right hip. S/p IM intertrochanteric fixation of right hip, ORIF of right wrist 11/21.  Clinical Impression  Pt s/p surgery above with deficits below. PTA, pt was using RW for ambulation. Upon eval, pt VERY limited by pain. Pt's RLE hypersensitive and pt would report pain anytime PT or OT touched. Required max to total assist +2 for basic bed mobility and required max encouragement for mobility. Only able to tolerate short time in sitting secondary to pain. Further mobility deferred. Pt currently a high fall risk. Recommending SNF at d/c to increase independence and safety with functional mobility. Will continue to follow acutely to progress mobility according to pt tolerance.     Follow Up Recommendations SNF;Supervision/Assistance - 24 hour    Equipment Recommendations  Wheelchair (measurements PT);Wheelchair cushion (measurements PT)    Recommendations for Other Services       Precautions / Restrictions Precautions Precautions: Fall Restrictions Weight Bearing Restrictions: Yes RUE Weight Bearing: Weight bear through elbow only RLE Weight Bearing: Weight bearing as tolerated Other Position/Activity Restrictions: no ROM at R wrist      Mobility  Bed Mobility Overal bed mobility: Needs Assistance Bed Mobility: Supine to Sit;Sit to Supine     Supine to sit: Max assist;+2 for physical assistance;+2 for safety/equipment;HOB elevated Sit to supine: Total assist;+2 for physical assistance;+2 for safety/equipment;HOB elevated   General bed mobility comments: Pt very painful with bed mobility. Very guarded throughout and asking PT/OT "just  leave me alone" during bed mobility. Ok once sitting at EOB, however, only able to tolerate short time at EOB.   Transfers                 General transfer comment: deferred at this time due to pain  Ambulation/Gait                Stairs            Wheelchair Mobility    Modified Rankin (Stroke Patients Only)       Balance Overall balance assessment: Needs assistance Sitting-balance support: Single extremity supported;Feet supported Sitting balance-Leahy Scale: Fair Sitting balance - Comments: initially mod A for seated balance, progressed to min guard with moments of no UE support Postural control: Left lateral lean(to accomodate for pain. )                                   Pertinent Vitals/Pain Pain Assessment: 0-10 Pain Score: 8  Pain Location: R leg and wrist Pain Descriptors / Indicators: Discomfort;Guarding;Grimacing;Moaning;Tender Pain Intervention(s): Limited activity within patient's tolerance;Monitored during session;Repositioned    Home Living Family/patient expects to be discharged to:: Private residence Living Arrangements: Children(son and his girlfriend ) Available Help at Discharge: Family;Available 24 hours/day Type of Home: House Home Access: Stairs to enter Entrance Stairs-Rails: Can reach both;Right;Left Entrance Stairs-Number of Steps: 3 Home Layout: One level Home Equipment: Walker - 2 wheels;Transport chair      Prior Function Level of Independence: Independent with assistive device(s)         Comments: used RW for ambulation, independent in ADL     Hand Dominance   Dominant Hand: Right    Extremity/Trunk Assessment  Upper Extremity Assessment Upper Extremity Assessment: Defer to OT evaluation RUE Deficits / Details: wrist spint to prevent ROM at wrist, digit flexion/extension limited RUE: Unable to fully assess due to pain;Unable to fully assess due to immobilization RUE Coordination: decreased  fine motor;decreased gross motor    Lower Extremity Assessment Lower Extremity Assessment: RLE deficits/detail RLE Deficits / Details: Very limited movement in RLE. Unable to perform ankle pumps or any motion at knee secondary to pain. Very resistive when PT tried to assist. Very sensitive to touch as well. Anytime PT would touch RLE, pt would report pain.  RLE: Unable to fully assess due to pain RLE Coordination: decreased fine motor;decreased gross motor    Cervical / Trunk Assessment Cervical / Trunk Assessment: Kyphotic  Communication   Communication: No difficulties  Cognition Arousal/Alertness: Awake/alert Behavior During Therapy: Anxious Overall Cognitive Status: Within Functional Limits for tasks assessed                                        General Comments General comments (skin integrity, edema, etc.): Pt's leg is hypersensitive    Exercises Other Exercises Other Exercises: Encouraged movement of RLE at toes and ankles. Educated that it would help with pain.    Assessment/Plan    PT Assessment Patient needs continued PT services  PT Problem List Decreased strength;Decreased range of motion;Decreased activity tolerance;Decreased balance;Decreased mobility;Decreased coordination;Decreased knowledge of use of DME;Decreased knowledge of precautions;Pain       PT Treatment Interventions DME instruction;Gait training;Functional mobility training;Therapeutic activities;Therapeutic exercise;Balance training;Neuromuscular re-education;Patient/family education    PT Goals (Current goals can be found in the Care Plan section)  Acute Rehab PT Goals Patient Stated Goal: to decrease pain PT Goal Formulation: With patient Time For Goal Achievement: 03/16/17 Potential to Achieve Goals: Fair    Frequency Min 3X/week   Barriers to discharge        Co-evaluation   Reason for Co-Treatment: Complexity of the patient's impairments (multi-system  involvement);For patient/therapist safety;To address functional/ADL transfers PT goals addressed during session: Mobility/safety with mobility OT goals addressed during session: ADL's and self-care       AM-PAC PT "6 Clicks" Daily Activity  Outcome Measure Difficulty turning over in bed (including adjusting bedclothes, sheets and blankets)?: Unable Difficulty moving from lying on back to sitting on the side of the bed? : Unable Difficulty sitting down on and standing up from a chair with arms (e.g., wheelchair, bedside commode, etc,.)?: Unable Help needed moving to and from a bed to chair (including a wheelchair)?: Total Help needed walking in hospital room?: Total Help needed climbing 3-5 steps with a railing? : Total 6 Click Score: 6    End of Session   Activity Tolerance: Patient limited by pain Patient left: in bed;with call bell/phone within reach;with bed alarm set Nurse Communication: Mobility status PT Visit Diagnosis: Unsteadiness on feet (R26.81);Other abnormalities of gait and mobility (R26.89);History of falling (Z91.81);Muscle weakness (generalized) (M62.81);Pain Pain - Right/Left: Right Pain - part of body: Leg(wrist )    Time: [id number] PT Time Calculation (min) (ACUTE ONLY): 27 min   Charges:   PT Evaluation $PT Eval Moderate Complexity: 1 Mod     PT G Codes:        Leighton Ruff, PT, DPT  Acute Rehabilitation Services  Pager: [phone]   Rudean Hitt 03/02/2017, 4:11 PM

## 2017-03-02 NOTE — Progress Notes (Signed)
PROGRESS NOTE    [name]  ZOX:[id number] DOB: [birth year] DOA: 03/01/2017 PCP: Jinny Sanders, MD     Brief Narrative:  [name] is a 83 y.o. female with medical history significant for diabetes, hypertension, hyperlipidemia, anxiety, GERD, hypothyroidism, presenting after sustaining a mechanical fall onto her right hip. She also had right wrist pain and some swelling.  Patient reports trying to go to the bathroom, when she lost balance, with no loss of consciousness, or hitting her head.  She denies any dizziness, presyncope or syncope.  The patient denies any history of seizures.  She denies any chest pain, palpitations, or shortness of breath or cough.  She denies any abdominal pain, nausea or vomiting.  Denies any focal weakness, numbness or tingling.  The patient was immobilized, and a splint was placed on her right wrist as well.  Orthopedic surgery was consulted.  Assessment & Plan:   Active Problems:   Secondary DM with neurological manifestations ([place])   Diabetic peripheral neuropathy associated with type 2 diabetes mellitus ([place])   Hyperlipidemia   GERD   Chronic constipation   Falls frequently   Closed right hip fracture (HCC)   Right hip and right wrist fracture after mechanical fall -S/p IM intertrochanteric fixation of right hip, ORIF of right write 11/21 with Dr. Griffin Basil -PT OT -Lovenox for 14 days postop for DVT ppx -Follow up in 10-14 days   Leukocytosis -Likely reactive to above -Improving. No sign of infectious etiology. UA negative. Blood cultures pending   DM type 2 -Well controlled. Ha1c 6.6  -SSI   Hypertension  -Hold lisinopril for marginal BP   CKD stage 3 -Baseline Cr 1 -Stable     DVT prophylaxis: lovenox  Code Status: full Family Communication: no family at bedside Disposition Plan: pending PT OT eval   Consultants:   Orthopedic surgery  Procedures:   Intramedullary nail intertrochanteric on right 11/21   Open  reduction internal fixation write on right 11/21  Antimicrobials:  Anti-infectives (From admission, onward)   Start     Dose/Rate Route Frequency Ordered Stop   03/01/17 0815  ceFAZolin (ANCEF) IVPB 2g/100 mL premix     2 g 200 mL/hr over 30 Minutes Intravenous On call to O.R. 03/01/17 0757 03/01/17 0845       Subjective: Patient complains of pain in her right wrist and right hip.  She states that coughing makes the pain worse.  She is very hesitant work with therapy due to pain levels.  No chest pain or shortness of breath.  Wants her catheter out  Objective: Vitals:   03/01/17 1600 03/01/17 2359 03/02/17 0450 03/02/17 1030  BP: (!) 111/59 (!) 112/59 (!) 104/54 97/84  Pulse: (!) 107 93 97   Resp: 20 18 18    Temp: (!) 97.4 F (36.3 C) 98.3 F (36.8 C) 97.8 F (36.6 C)   TempSrc: Oral Oral Oral   SpO2: 98% 96% 96%   Weight:      Height:        Intake/Output Summary (Last 24 hours) at 03/02/2017 1311 Last data filed at 03/02/2017 8119 Gross per 24 hour  Intake 210 ml  Output 625 ml  Net -415 ml   Filed Weights   03/01/17 0354  Weight: 67.1 kg (148 lb)    Examination:  General exam: Appears calm and comfortable  Respiratory system: Clear to auscultation. Respiratory effort normal. Cardiovascular system: S1 & S2 heard, RRR. +murmur  Gastrointestinal system: Abdomen is nondistended, soft  and nontender. No organomegaly or masses felt. Normal bowel sounds heard. Central nervous system: Alert and oriented. No focal neurological deficits. Extremities: +right wrist in dressing/wrap  Skin: No rashes, lesions or ulcers Psychiatry: Judgement and insight appear normal. Mood & affect appropriate.   Data Reviewed: I have personally reviewed following labs and imaging studies  CBC: Recent Labs  Lab 03/01/17 0359 03/02/17 0347  WBC 19.5* 13.8*  NEUTROABS 15.2*  --   HGB 12.5 9.7*  HCT 38.5 30.3*  MCV 81.6 81.5  PLT 327 627   Basic Metabolic Panel: Recent Labs  Lab  03/01/17 0359 03/01/17 1555 03/02/17 0347  NA 135  --  136  K 4.4  --  5.2*  CL 104  --  106  CO2 21*  --  21*  GLUCOSE 177*  --  213*  BUN 18  --  25*  CREATININE 0.91 1.00 1.05*  CALCIUM 8.7*  --  7.7*   GFR: Estimated Creatinine Clearance: 36.5 mL/min (A) (by C-G formula based on SCr of 1.05 mg/dL (H)). Liver Function Tests: Recent Labs  Lab 03/02/17 0347  AST 21  ALT 12*  ALKPHOS 68  BILITOT 0.8  PROT 5.6*  ALBUMIN 2.6*   No results for input(s): LIPASE, AMYLASE in the last 168 hours. No results for input(s): AMMONIA in the last 168 hours. Coagulation Profile: Recent Labs  Lab 03/01/17 0359 03/01/17 1555 03/02/17 0347  INR 0.94 1.00 1.07   Cardiac Enzymes: No results for input(s): CKTOTAL, CKMB, CKMBINDEX, TROPONINI in the last 168 hours. BNP (last 3 results) No results for input(s): PROBNP in the last 8760 hours. HbA1C: Recent Labs    03/01/17 1555  HGBA1C 6.6*   CBG: Recent Labs  Lab 03/01/17 1220 03/01/17 1343 03/01/17 1648 03/02/17 0631 03/02/17 1209  GLUCAP 250* 252* 248* 191* 205*   Lipid Profile: No results for input(s): CHOL, HDL, LDLCALC, TRIG, CHOLHDL, LDLDIRECT in the last 72 hours. Thyroid Function Tests: No results for input(s): TSH, T4TOTAL, FREET4, T3FREE, THYROIDAB in the last 72 hours. Anemia Panel: No results for input(s): VITAMINB12, FOLATE, FERRITIN, TIBC, IRON, RETICCTPCT in the last 72 hours. Sepsis Labs: No results for input(s): PROCALCITON, LATICACIDVEN in the last 168 hours.  No results found for this or any previous visit (from the past 240 hour(s)).     Radiology Studies: Dg Chest 1 View  Result Date: 03/01/2017 CLINICAL DATA:  [place] this morning. EXAM: CHEST 1 VIEW COMPARISON:  04/20/2012 FINDINGS: Cardiac enlargement. No vascular congestion or edema. Linear fibrosis or atelectasis in the lung bases. No focal consolidation. No blunting of costophrenic angles. No pneumothorax. Calcification of the aorta.  Degenerative changes in the spine and shoulders. IMPRESSION: Fibrosis or atelectasis in the lung bases.  No focal consolidation. Electronically Signed   By: Lucienne Capers M.D.   On: 03/01/2017 05:04   Dg Wrist 2 Views Right  Result Date: 03/02/2017 CLINICAL DATA:  Open reduction internal fixation for fracture EXAM: RIGHT WRIST - 2 VIEW COMPARISON:  March 01, 2017 FINDINGS: Frontal and lateral views were obtained. There is screw and plate fixation through a fracture of the distal radius with alignment near anatomic. There is avulsion the ulnar styloid. No new fracture. No dislocation. Joint spaces appear remarkable overall. IMPRESSION: Screw and plate fixation through comminuted fracture distal radial metaphysis with alignment near anatomic. Avulsion ulnar styloid. No new fracture. No dislocation. The joint spaces overall appear unremarkable. Electronically Signed   By: Lowella Grip III M.D.   On: 03/02/2017  08:15   Dg Wrist Complete Right  Result Date: 03/01/2017 CLINICAL DATA:  Fall with right wrist pain EXAM: RIGHT WRIST - COMPLETE 3+ VIEW COMPARISON:  None. FINDINGS: There is a impacted, comminuted fracture of the distal right radius. There is a minimally displaced fracture of the right ulnar styloid. There is moderate circumferential soft tissue swelling. No carpal fracture. IMPRESSION: Impacted, comminuted fracture of the distal right radius and minimally displaced fracture of the ulnar styloid. Electronically Signed   By: Ulyses Jarred M.D.   On: 03/01/2017 05:01   Dg Hand Complete Right  Result Date: 03/01/2017 CLINICAL DATA:  Fall EXAM: RIGHT HAND - COMPLETE 3+ VIEW COMPARISON:  None. FINDINGS: The bones are osteopenic. There is no fracture or dislocation of the bones of the right hand. Fracture of the right wrist is characterized more completely on the dedicated wrist study. No advanced osteoarthrosis. No erosions. IMPRESSION: Osteopenia without acute fracture or dislocation of the  right hand. Electronically Signed   By: Ulyses Jarred M.D.   On: 03/01/2017 04:59   Dg C-arm 1-60 Min  Result Date: 03/01/2017 CLINICAL DATA:  Right femoral nail EXAM: DG C-ARM 61-120 MIN; RIGHT FEMUR 2 VIEWS COMPARISON:  03/01/2017 FINDINGS: Multiple intraoperative spot images demonstrate internal fixation across the proximal right femoral fracture. No hardware complicating feature. Near anatomic alignment. IMPRESSION: Internal fixation across the proximal right femoral fracture. Electronically Signed   By: Rolm Baptise M.D.   On: 03/01/2017 11:42   Dg Hip Unilat With Pelvis 2-3 Views Right  Result Date: 03/01/2017 CLINICAL DATA:  Status post ORIF of right hip fracture EXAM: DG HIP (WITH OR WITHOUT PELVIS) 3V RIGHT COMPARISON:  03/01/2017 FINDINGS: Medullary rod and compression screw are now seen in the proximal right femur. Fracture fragments are in near anatomic alignment. Pelvic ring is intact. No other focal abnormality is seen. IMPRESSION: Status post ORIF of right femoral fracture Electronically Signed   By: Inez Catalina M.D.   On: 03/01/2017 14:00   Dg Hip Unilat W Or Wo Pelvis 2-3 Views Right  Result Date: 03/01/2017 CLINICAL DATA:  Lateral right hip pain after a fall this morning. EXAM: DG HIP (WITH OR WITHOUT PELVIS) 2-3V RIGHT COMPARISON:  None. FINDINGS: Comminuted inter trochanteric fractures of the proximal right femur with varus angulation of the fracture fragments. Displacement of greater and lesser trochanteric fragments. No dislocation at the hip joint. Degenerative changes in the lower lumbar spine and in both hips. Pelvis appears intact. SI joints and symphysis pubis are not displaced. Prominent vascular calcifications. IMPRESSION: Acute comminuted inter trochanteric fractures of the proximal right femur with varus angulation. Electronically Signed   By: Lucienne Capers M.D.   On: 03/01/2017 05:03   Dg Femur, Min 2 Views Right  Result Date: 03/01/2017 CLINICAL DATA:  Right  femoral nail EXAM: DG C-ARM 61-120 MIN; RIGHT FEMUR 2 VIEWS COMPARISON:  03/01/2017 FINDINGS: Multiple intraoperative spot images demonstrate internal fixation across the proximal right femoral fracture. No hardware complicating feature. Near anatomic alignment. IMPRESSION: Internal fixation across the proximal right femoral fracture. Electronically Signed   By: Rolm Baptise M.D.   On: 03/01/2017 11:42      Scheduled Meds: . acetaminophen  1,000 mg Oral Q8H  . calcium carbonate  1 tablet Oral Q breakfast  . enoxaparin (LOVENOX) injection  40 mg Subcutaneous Q24H  . insulin aspart  0-9 Units Subcutaneous TID WC  . pantoprazole  40 mg Oral Daily   Continuous Infusions: . sodium chloride 50 mL/hr  at 03/01/17 1629     LOS: 1 day    Time spent: 30 minutes   Dessa Phi, DO [facility] [email or web address] Password [email or web address] 03/02/2017, 1:11 PM

## 2017-03-02 NOTE — Progress Notes (Addendum)
Notified MD Choi,J of BP 97/84- held Lisinopril  5 mg PO.   Asked if foley can be DC today. Awaiting orders.   [place] Notified MD Choi of k=5.2 and Hgb=9.7 from 12.5 yesterday

## 2017-03-02 NOTE — Progress Notes (Signed)
ORTHOPAEDIC PROGRESS NOTE  s/p Procedure(s): INTRAMEDULLARY (IM) NAIL INTERTROCHANTRIC OPEN REDUCTION INTERNAL FIXATION (ORIF) WRIST FRACTURE  SUBJECTIVE: Reports mild pain about operative site. No chest pain. No SOB. No nausea/vomiting. No other complaints.  OBJECTIVE: PE:  Vitals:   03/01/17 2359 03/02/17 0450  BP: (!) 112/59 (!) 104/54  Pulse: 93 97  Resp: 18 18  Temp: 98.3 F (36.8 C) 97.8 F (36.6 C)  SpO2: 96% 96%     ASSESSMENT: [name] is a 83 y.o. female doing well postoperatively.  PLAN: Weightbearing: WBAT RLE, WBAT R elbow, NWB R hand - ok for platform walker Insicional and dressing care: OK to remove dressings from hip POD3 and leave open to air with dry gauze PRN, Wrist splint intact until clinic in 2 weeks Orthopedic device(s): None Showering: OK to shower POD3, keep splint on wrist dry VTE prophylaxis: Lovenox 40mg  qd 2 weeks Pain control: Standing tylenol, breakthrough narcotics Follow - up plan: 2 weeks Contact information:  Weekdays 8-5 Ophelia Charter MD [phone], After hours and holidays please check [email or web address] for group call information for [facility]

## 2017-03-03 ENCOUNTER — Encounter (HOSPITAL_COMMUNITY): Payer: Self-pay | Admitting: Orthopaedic Surgery

## 2017-03-03 LAB — CBC
HCT: 27 % — ABNORMAL LOW (ref 36.0–46.0)
Hemoglobin: 8.6 g/dL — ABNORMAL LOW (ref 12.0–15.0)
MCH: 26.1 pg (ref 26.0–34.0)
MCHC: 31.9 g/dL (ref 30.0–36.0)
MCV: 81.8 fL (ref 78.0–100.0)
PLATELETS: 268 10*3/uL (ref 150–400)
RBC: 3.3 MIL/uL — ABNORMAL LOW (ref 3.87–5.11)
RDW: 15.1 % (ref 11.5–15.5)
WBC: 15.8 10*3/uL — AB (ref 4.0–10.5)

## 2017-03-03 LAB — BASIC METABOLIC PANEL
ANION GAP: 7 (ref 5–15)
Anion gap: 7 (ref 5–15)
BUN: 27 mg/dL — ABNORMAL HIGH (ref 6–20)
BUN: 27 mg/dL — AB (ref 6–20)
CALCIUM: 8 mg/dL — AB (ref 8.9–10.3)
CHLORIDE: 102 mmol/L (ref 101–111)
CO2: 22 mmol/L (ref 22–32)
CO2: 20 mmol/L — ABNORMAL LOW (ref 22–32)
CREATININE: 1.14 mg/dL — AB (ref 0.44–1.00)
CREATININE: 1.07 mg/dL — AB (ref 0.44–1.00)
Calcium: 7.7 mg/dL — ABNORMAL LOW (ref 8.9–10.3)
Chloride: 102 mmol/L (ref 101–111)
GFR calc Af Amer: 49 mL/min — ABNORMAL LOW (ref >60)
GFR calc Af Amer: 53 mL/min — ABNORMAL LOW (ref >60)
GFR calc non Af Amer: 43 mL/min — ABNORMAL LOW (ref >60)
GFR calc non Af Amer: 46 mL/min — ABNORMAL LOW (ref >60)
GLUCOSE: 268 mg/dL — AB (ref 65–99)
Glucose, Bld: 319 mg/dL — ABNORMAL HIGH (ref 65–99)
Potassium: 5.6 mmol/L — ABNORMAL HIGH (ref 3.5–5.1)
Potassium: 4.8 mmol/L (ref 3.5–5.1)
SODIUM: 129 mmol/L — AB (ref 135–145)
Sodium: 131 mmol/L — ABNORMAL LOW (ref 135–145)

## 2017-03-03 LAB — GLUCOSE, CAPILLARY
Glucose-Capillary: 314 mg/dL — ABNORMAL HIGH (ref 65–99)
Glucose-Capillary: 245 mg/dL — ABNORMAL HIGH (ref 65–99)
Glucose-Capillary: 261 mg/dL — ABNORMAL HIGH (ref 65–99)
Glucose-Capillary: 230 mg/dL — ABNORMAL HIGH (ref 65–99)

## 2017-03-03 MED ORDER — INSULIN GLARGINE 100 UNIT/ML ~~LOC~~ SOLN
10.0000 [IU] | Freq: Every day | SUBCUTANEOUS | Status: DC
  Administered 2017-03-04: 10 [IU] via SUBCUTANEOUS
  Filled 2017-03-03: qty 0.1

## 2017-03-03 MED ORDER — FUROSEMIDE 10 MG/ML IJ SOLN
40.0000 mg | Freq: Once | INTRAMUSCULAR | Status: AC
  Administered 2017-03-03: 40 mg via INTRAVENOUS
  Filled 2017-03-03: qty 4

## 2017-03-03 MED ORDER — SODIUM CHLORIDE 0.9 % IV BOLUS (SEPSIS)
500.0000 mL | Freq: Once | INTRAVENOUS | Status: AC
  Administered 2017-03-03: 500 mL via INTRAVENOUS

## 2017-03-03 MED ORDER — OXYCODONE HCL 5 MG PO TABS
5.0000 mg | ORAL_TABLET | ORAL | Status: DC | PRN
  Administered 2017-03-04 – 2017-03-17 (×26): 5 mg via ORAL
  Filled 2017-03-03 (×28): qty 1

## 2017-03-03 NOTE — Clinical Social Work Note (Signed)
Clinical Social Work Assessment  Patient Details  Name: [name] MRN: [id number] Date of Birth: [birth year]  Date of referral:  03/03/17               Reason for consult:  Facility Placement                Permission sought to share information with:  Facility Sport and exercise psychologist, Family Supports Permission granted to share information::  Yes, Verbal Permission Granted  Name::     [name]  Agency::  [facility]  Relationship::  daughter  Contact Information:  [phone]  Housing/Transportation Living arrangements for the past 2 months:  Single Family Home Source of Information:  Adult Children Patient Interpreter Needed:  None Criminal Activity/Legal Involvement Pertinent to Current Situation/Hospitalization:  No - Comment as needed Significant Relationships:  Adult Children Lives with:  Self Do you feel safe going back to the place where you live?  Yes Need for family participation in patient care:  Yes (Comment)  Care giving concerns: Patient from home. PT recommending SNF.   Social Worker assessment / plan: CSW met with patient and daughter at bedside. Patient and family agreeable to SNF. CSW discussed discharge planning and referral process. CSW faxed out initial referrals. CSW to follow and support with discharge.  Employment status:  Retired Surveyor, [employer] PT Recommendations:  [place] / Referral to community resources:  [place]  Patient/Family's Response to care: Patient and daughter appreciative of care.  Patient/Family's Understanding of and Emotional Response to Diagnosis, Current Treatment, and Prognosis: Patient and family with understanding of patient's medical conditions and agreeable to SNF.  Emotional Assessment Appearance:  Appears stated age Attitude/Demeanor/Rapport:  Other(appropriate) Affect (typically observed):  Calm Orientation:  Oriented to Self, Oriented to  Place, Oriented to  Time, Oriented to Situation Alcohol / Substance use:  Not Applicable Psych involvement (Current and /or in the community):  No (Comment)  Discharge Needs  Concerns to be addressed:  Discharge Planning Concerns Readmission within the last 30 days:  No Current discharge risk:  Physical Impairment Barriers to Discharge:  Continued Medical Work up   Estanislado Emms, LCSW 03/03/2017, 4:39 PM

## 2017-03-03 NOTE — Progress Notes (Signed)
[name] admitted on 03/01/17 with fall that am with subsequent right hip and right wrist fractures. She underwent ORIF right hip and right wrist later that day and is WBAT. Therapy evaluations done yesterday showing limitations due to pain and activity tolerance. Agree with recommendations of SNF for follow up therapy. Will defer CIR consult for now.

## 2017-03-03 NOTE — Progress Notes (Signed)
PROGRESS NOTE    [name]  OHY:[id number] DOB: 10/15/1929 DOA: 03/01/2017 PCP: Jinny Sanders, MD     Brief Narrative:  [name] is a 83 y.o. female with medical history significant for diabetes, hypertension, hyperlipidemia, anxiety, GERD, hypothyroidism, presenting after sustaining a mechanical fall onto her right hip. She also had right wrist pain and some swelling.  Patient reports trying to go to the bathroom, when she lost balance, with no loss of consciousness, or hitting her head.  She denies any dizziness, presyncope or syncope.  The patient denies any history of seizures.  She denies any chest pain, palpitations, or shortness of breath or cough.  She denies any abdominal pain, nausea or vomiting.  Denies any focal weakness, numbness or tingling.  The patient was immobilized, and a splint was placed on her right wrist as well.  Orthopedic surgery was consulted.  Assessment & Plan:   Active Problems:   Secondary DM with neurological manifestations ([place])   Diabetic peripheral neuropathy associated with type 2 diabetes mellitus ([place])   Hyperlipidemia   GERD   Chronic constipation   Falls frequently   Closed right hip fracture (HCC)   Right hip and right wrist fracture after mechanical fall -S/p IM intertrochanteric fixation of right hip, ORIF of right write 11/21 with Dr. Griffin Basil -PT OT -Lovenox for 14 days postop for DVT ppx -Follow up in 10-14 days   Leukocytosis -Likely reactive to above -No sign of infectious etiology. UA negative. Blood cultures pending   Hyperkalemia -Hold lisinopril, give lasix 1 time and repeat BMP this afternoon   DM type 2 -Well controlled. Ha1c 6.6  -SSI. Add lantus today due to hyperglycemia   Hypertension  -Hold lisinopril for marginal BP   CKD stage 3 -Baseline Cr 1 -Stable     DVT prophylaxis: lovenox  Code Status: full Family Communication: no family at bedside, spoke with son over the phone Disposition Plan: SNF  if BMP and WBC stable tomorrow    Consultants:   Orthopedic surgery  Procedures:   Intramedullary nail intertrochanteric on right 11/21   Open reduction internal fixation write on right 11/21  Antimicrobials:  Anti-infectives (From admission, onward)   Start     Dose/Rate Route Frequency Ordered Stop   03/01/17 0815  ceFAZolin (ANCEF) IVPB 2g/100 mL premix     2 g 200 mL/hr over 30 Minutes Intravenous On call to O.R. 03/01/17 0757 03/01/17 0845       Subjective: She continues to complain of pain.  She states that she does not like when physical therapy pushes her too hard.  We discussed the importance of physical therapy and that we can manage her pain as best as we can.  She needs to get out of bed.  Discussed this with her son as well.  Denied any chest pain or shortness of breath at this morning   Objective: Vitals:   03/02/17 1355 03/02/17 2110 03/03/17 0426 03/03/17 1100  BP: (!) 110/55 (!) 91/57 102/68 (!) 93/59  Pulse: 96 (!) 113 (!) 126 (!) 101  Resp: 18 16 18 16   Temp: 98.2 F (36.8 C) 98.6 F (37 C) 98.2 F (36.8 C)   TempSrc: Oral Oral Oral   SpO2: 99% 93% 93% 98%  Weight:      Height:        Intake/Output Summary (Last 24 hours) at 03/03/2017 1252 Last data filed at 03/03/2017 1230 Gross per 24 hour  Intake 480 ml  Output  450 ml  Net 30 ml   Filed Weights   03/01/17 0354  Weight: 67.1 kg (148 lb)    Examination:  General exam: Appears calm and comfortable  Respiratory system: Clear to auscultation. Respiratory effort normal. Cardiovascular system: S1 & S2 heard, RRR. +murmur  Gastrointestinal system: Abdomen is nondistended, soft and nontender. No organomegaly or masses felt. Normal bowel sounds heard. Central nervous system: Alert and oriented. No focal neurological deficits. Extremities: +right wrist in dressing/wrap, +right hip dressing clean  Skin: No rashes, lesions or ulcers Psychiatry: Judgement and insight appear normal. Mood & affect  appropriate.   Data Reviewed: I have personally reviewed following labs and imaging studies  CBC: Recent Labs  Lab 03/01/17 0359 03/02/17 0347 03/02/17 1418 03/03/17 0709  WBC 19.5* 13.8*  --  15.8*  NEUTROABS 15.2*  --   --   --   HGB 12.5 9.7* 8.8* 8.6*  HCT 38.5 30.3* 27.5* 27.0*  MCV 81.6 81.5  --  81.8  PLT 327 323  --  361   Basic Metabolic Panel: Recent Labs  Lab 03/01/17 0359 03/01/17 1555 03/02/17 0347 03/02/17 1418 03/03/17 0709  NA 135  --  136 132* 131*  K 4.4  --  5.2* 4.6 5.6*  CL 104  --  106 103 102  CO2 21*  --  21* 21* 22  GLUCOSE 177*  --  213* 220* 319*  BUN 18  --  25* 21* 27*  CREATININE 0.91 1.00 1.05* 0.94 1.14*  CALCIUM 8.7*  --  7.7* 7.7* 8.0*   GFR: Estimated Creatinine Clearance: 33.6 mL/min (A) (by C-G formula based on SCr of 1.14 mg/dL (H)). Liver Function Tests: Recent Labs  Lab 03/02/17 0347  AST 21  ALT 12*  ALKPHOS 68  BILITOT 0.8  PROT 5.6*  ALBUMIN 2.6*   No results for input(s): LIPASE, AMYLASE in the last 168 hours. No results for input(s): AMMONIA in the last 168 hours. Coagulation Profile: Recent Labs  Lab 03/01/17 0359 03/01/17 1555 03/02/17 0347  INR 0.94 1.00 1.07   Cardiac Enzymes: No results for input(s): CKTOTAL, CKMB, CKMBINDEX, TROPONINI in the last 168 hours. BNP (last 3 results) No results for input(s): PROBNP in the last 8760 hours. HbA1C: Recent Labs    03/01/17 1555  HGBA1C 6.6*   CBG: Recent Labs  Lab 03/02/17 1209 03/02/17 1629 03/02/17 2109 03/03/17 0623 03/03/17 1202  GLUCAP 205* 179* 214* 314* 245*   Lipid Profile: No results for input(s): CHOL, HDL, LDLCALC, TRIG, CHOLHDL, LDLDIRECT in the last 72 hours. Thyroid Function Tests: No results for input(s): TSH, T4TOTAL, FREET4, T3FREE, THYROIDAB in the last 72 hours. Anemia Panel: No results for input(s): VITAMINB12, FOLATE, FERRITIN, TIBC, IRON, RETICCTPCT in the last 72 hours. Sepsis Labs: No results for input(s): PROCALCITON,  LATICACIDVEN in the last 168 hours.  Recent Results (from the past 240 hour(s))  Culture, blood (Routine X 2) w Reflex to ID Panel     Status: None (Preliminary result)   Collection Time: 03/01/17  3:55 PM  Result Value Ref Range Status   Specimen Description BLOOD BLOOD LEFT WRIST  Final   Special Requests   Final    IN PEDIATRIC BOTTLE Blood Culture results may not be optimal due to an excessive volume of blood received in culture bottles   Culture NO GROWTH < 24 HOURS  Final   Report Status PENDING  Incomplete  Culture, blood (Routine X 2) w Reflex to ID Panel  Status: None (Preliminary result)   Collection Time: 03/01/17  4:03 PM  Result Value Ref Range Status   Specimen Description BLOOD RIGHT HAND  Final   Special Requests IN PEDIATRIC BOTTLE Blood Culture adequate volume  Final   Culture NO GROWTH < 24 HOURS  Final   Report Status PENDING  Incomplete       Radiology Studies: Dg Wrist 2 Views Right  Result Date: 03/02/2017 CLINICAL DATA:  Open reduction internal fixation for fracture EXAM: RIGHT WRIST - 2 VIEW COMPARISON:  March 01, 2017 FINDINGS: Frontal and lateral views were obtained. There is screw and plate fixation through a fracture of the distal radius with alignment near anatomic. There is avulsion the ulnar styloid. No new fracture. No dislocation. Joint spaces appear remarkable overall. IMPRESSION: Screw and plate fixation through comminuted fracture distal radial metaphysis with alignment near anatomic. Avulsion ulnar styloid. No new fracture. No dislocation. The joint spaces overall appear unremarkable. Electronically Signed   By: Lowella Grip III M.D.   On: 03/02/2017 08:15   Dg Hip Unilat With Pelvis 2-3 Views Right  Result Date: 03/01/2017 CLINICAL DATA:  Status post ORIF of right hip fracture EXAM: DG HIP (WITH OR WITHOUT PELVIS) 3V RIGHT COMPARISON:  03/01/2017 FINDINGS: Medullary rod and compression screw are now seen in the proximal right femur.  Fracture fragments are in near anatomic alignment. Pelvic ring is intact. No other focal abnormality is seen. IMPRESSION: Status post ORIF of right femoral fracture Electronically Signed   By: Inez Catalina M.D.   On: 03/01/2017 14:00      Scheduled Meds: . acetaminophen  1,000 mg Oral Q8H  . calcium carbonate  1 tablet Oral Q breakfast  . enoxaparin (LOVENOX) injection  40 mg Subcutaneous Q24H  . insulin aspart  0-9 Units Subcutaneous TID WC  . insulin glargine  10 Units Subcutaneous QHS  . pantoprazole  40 mg Oral Daily   Continuous Infusions:    LOS: 2 days    Time spent: 30 minutes   Dessa Phi, DO [facility] [email or web address] Password [email or web address] 03/03/2017, 12:52 PM

## 2017-03-03 NOTE — Progress Notes (Signed)
Assessment / Plan: 2 Days Post-Op  S/P Procedure(s) (LRB): INTRAMEDULLARY (IM) NAIL INTERTROCHANTRIC (Right) OPEN REDUCTION INTERNAL FIXATION (ORIF) WRIST FRACTURE (Right) By Dr. Ophelia Charter on 03/01/17  Active Problems:   Secondary DM with neurological manifestations ([facility] [place])   Diabetic peripheral neuropathy associated with type 2 diabetes mellitus ([place])   Hyperlipidemia   GERD   Chronic constipation   Falls frequently   Closed right hip fracture (HCC)   Right hip fracture Status post IM nail. Slow to mobilize due to pain with weightbearing.  Right distal radius fracture Stable status post ORIF   PLAN: Apply ice prn Incentive spirometry Maintain splint RUE  Weightbearing: WBAT RLE, WBAT R elbow, NWB R hand - ok for platform walker Insicional and dressing care: OK to remove dressings from hip POD3 and leave open to air with dry gauze PRN, Wrist splint intact until clinic in 2 weeks Orthopedic device(s): None Showering: OK to shower POD3, keep splint on wrist dry VTE prophylaxis: Lovenox 40mg  qd 2 weeks Pain control: Standing tylenol, breakthrough narcotics Dispo: SNF recommended by therapies.    Follow - up plan: 2 weeks w/ Dr. Griffin Basil   Subjective: Patient reports pain as moderate to severe.  No lower extremity numbness beyond baseline DM neuropathy. Urinary retention requiring I/O catheterization overnight. Tolerating liquids and some diet.  +Flatus.  No CP, SOB.   Objective:   VITALS:   Vitals:   03/02/17 1030 03/02/17 1355 03/02/17 2110 03/03/17 0426  BP: 97/84 (!) 110/55 (!) 91/57 102/68  Pulse:  96 (!) 113 (!) 126  Resp:  18 16 18   Temp:  98.2 F (36.8 C) 98.6 F (37 C) 98.2 F (36.8 C)  TempSrc:  Oral Oral Oral  SpO2:  99% 93% 93%  Weight:      Height:       CBC Latest Ref Rng & Units 03/03/2017 03/02/2017 03/02/2017  WBC 4.0 - 10.5 K/uL 15.8(H) - 13.8(H)  Hemoglobin 12.0 - 15.0 g/dL 8.6(L) 8.8(L) 9.7(L)  Hematocrit 36.0 - 46.0 % 27.0(L) 27.5(L)  30.3(L)  Platelets 150 - 400 K/uL 268 - 323   BMP Latest Ref Rng & Units 03/03/2017 03/02/2017 03/02/2017  Glucose 65 - 99 mg/dL 319(H) 220(H) 213(H)  BUN 6 - 20 mg/dL 27(H) 21(H) 25(H)  Creatinine 0.44 - 1.00 mg/dL 1.14(H) 0.94 1.05(H)  Sodium 135 - 145 mmol/L 131(L) 132(L) 136  Potassium 3.5 - 5.1 mmol/L 5.6(H) 4.6 5.2(H)  Chloride 101 - 111 mmol/L 102 103 106  CO2 22 - 32 mmol/L 22 21(L) 21(L)  Calcium 8.9 - 10.3 mg/dL 8.0(L) 7.7(L) 7.7(L)   Intake/Output      11/22 0701 - 11/23 0700 11/23 0701 - 11/24 0700   P.O. 180    I.V. (mL/kg)     Other     IV Piggyback     Total Intake(mL/kg) 180 (2.7)    Urine (mL/kg/hr) 700 (0.4)    Blood     Total Output 700    Net -520           Physical Exam: General: NAD.  Upright in bed asleep on arrival.  Uncomfortable appearing with movement.  Conversant.  [place] in place - No increased work of breathing.   MSK RLE: Right anterior thigh tender to palpation.  Mild to moderate swelling.  No ecchymosis.  Thigh compressible. Sensation intact distally Feet warm Dorsiflexion/Plantar flexion intact Incisions: dressing C/D/I RUE: Volar splint / Ace wrap in place.  Hand warm, elevated.  Moving all fingers.  Sensation intact.  [name] III, PA-C 03/03/2017, 8:27 AM

## 2017-03-03 NOTE — Plan of Care (Signed)
  Progressing Education: Knowledge of General Education information will improve 03/03/2017 1129 - Progressing by Rance Muir, RN Health Behavior/Discharge Planning: Ability to manage health-related needs will improve 03/03/2017 1129 - Progressing by Rance Muir, RN Clinical Measurements: Ability to maintain clinical measurements within normal limits will improve 03/03/2017 1129 - Progressing by Rance Muir, RN Will remain free from infection 03/03/2017 1129 - Progressing by Rance Muir, RN Diagnostic test results will improve 03/03/2017 1129 - Progressing by Rance Muir, RN Respiratory complications will improve 03/03/2017 1129 - Progressing by Rance Muir, RN Cardiovascular complication will be avoided 03/03/2017 1129 - Progressing by Rance Muir, RN Activity: Risk for activity intolerance will decrease 03/03/2017 1129 - Progressing by Rance Muir, RN Nutrition: Adequate nutrition will be maintained 03/03/2017 1129 - Progressing by Rance Muir, RN Coping: Level of anxiety will decrease 03/03/2017 1129 - Progressing by Rance Muir, RN Elimination: Will not experience complications related to bowel motility 03/03/2017 1129 - Progressing by Rance Muir, RN Will not experience complications related to urinary retention 03/03/2017 1129 - Progressing by Rance Muir, RN Pain Managment: General experience of comfort will improve 03/03/2017 1129 - Progressing by Rance Muir, RN Safety: Ability to remain free from injury will improve 03/03/2017 1129 - Progressing by Rance Muir, RN Skin Integrity: Risk for impaired skin integrity will decrease 03/03/2017 1129 - Progressing by Rance Muir, RN Education: Verbalization of understanding the information provided (i.e., activity precautions, restrictions, etc) will improve 03/03/2017 1129 - Progressing by Rance Muir, RN Activity: Ability to ambulate and perform ADLs will improve 03/03/2017 1129 - Progressing by Rance Muir, RN Clinical Measurements: Postoperative  complications will be avoided or minimized 03/03/2017 1129 - Progressing by Rance Muir, RN Self-Concept: Ability to maintain and perform role responsibilities to the fullest extent possible will improve 03/03/2017 1129 - Progressing by Rance Muir, RN Pain Management: Pain level will decrease 03/03/2017 1129 - Progressing by Rance Muir, RN

## 2017-03-03 NOTE — NC FL2 (Signed)
[place] LEVEL OF CARE SCREENING TOOL     IDENTIFICATION  Patient Name: [name] Birthdate: 1929/08/25 Sex: female Admission Date (Current Location): 03/01/2017  [facility] and [place] Number:  Herbalist and Address:  [facility] [place], [place] [address], [place], [place] [address]      Provider Number: [id number]  Attending Physician Name and Address:  Dessa Phi, DO  Relative Name and Phone Number:  Kiwana Deblasi, son, [phone]    Current Level of Care: Hospital Recommended Level of Care: [place] Prior Approval Number:    Date Approved/Denied:   PASRR Number: Manual Review  Discharge Plan: SNF    Current Diagnoses: Patient Active Problem List   Diagnosis Date Noted  . Closed right hip fracture ([place]) 03/01/2017  . Carotid stenosis, asymptomatic, bilateral 01/12/2017  . Dizziness 12/05/2016  . Fatigue 12/05/2016  . Osteoporosis 07/19/2016  . History of basal cell cancer 07/12/2016  . Seborrheic dermatitis 07/12/2016  . Allergic rhinitis 07/12/2016  . Falls frequently 11/03/2015  . Right humeral fracture 11/03/2015  . Systolic murmur 63/04/6008  . Counseling regarding end of life decision making 02/20/2015  . Hypertension complicating diabetes ([place]) 02/20/2015  . Microalbuminuria due to type 2 diabetes mellitus ([place]) 11/06/2014  . Hematuria 11/04/2014  . Hx of partial thyroidectomy 11/04/2014  . Chronic constipation 11/04/2014  . Atherosclerosis of native arteries of the extremities with intermittent claudication 10/18/2012  . PAD (peripheral artery disease) ([place]) 12/06/2011  . Claudication, intermittent ([place]) 12/06/2011  . Chronic total occlusion of artery of the extremities ([place]) 09/15/2011  . Hyperlipidemia 10/08/2007  . Diabetic peripheral neuropathy associated with type 2 diabetes mellitus ([place]) 05/11/2007  . CALLUSES, FEET, BILATERAL 05/11/2007  . Secondary DM with neurological  manifestations ([place]) 09/25/2006  . GERD 09/25/2006  . HIATAL HERNIA 09/25/2006    Orientation RESPIRATION BLADDER Height & Weight     Self, Time, Situation, Place  Normal External catheter Weight: 148 lb (67.1 kg) Height:  5' 3.5" (161.3 cm)  BEHAVIORAL SYMPTOMS/MOOD NEUROLOGICAL BOWEL NUTRITION STATUS      Continent Diet  AMBULATORY STATUS COMMUNICATION OF NEEDS Skin   Extensive Assist Verbally Surgical wounds                       Personal Care Assistance Level of Assistance  Bathing, Feeding, Dressing Bathing Assistance: Maximum assistance Feeding assistance: Limited assistance Dressing Assistance: Maximum assistance     Functional Limitations Info  Sight, Hearing, Speech Sight Info: Adequate Hearing Info: Adequate Speech Info: Adequate    SPECIAL CARE FACTORS FREQUENCY  PT (By licensed PT), OT (By licensed OT)     PT Frequency: 3x week OT Frequency: 2x week            Contractures Contractures Info: Not present    Additional Factors Info  Allergies, Code Status Code Status Info: Full Code Allergies Info: CODEINE, METFORMIN, PENICILLINS, PIOGLITAZONE, FENOFIBRATE            Current Medications (03/03/2017):  This is the current hospital active medication list Current Facility-Administered Medications  Medication Dose Route Frequency Provider Last Rate Last Dose  . acetaminophen (TYLENOL) tablet 1,000 mg  1,000 mg Oral Q8H Stanbery, Mary L, PA-C   1,000 mg at 03/03/17 9323  . bisacodyl (DULCOLAX) suppository 10 mg  10 mg Rectal Daily PRN Sharene Butters E, PA-C      . calcium carbonate (OS-CAL - dosed in mg of elemental calcium) tablet  500 mg of elemental calcium  1 tablet Oral Q breakfast Waldemar Dickens, MD   500 mg of elemental calcium at 03/03/17 0903  . enoxaparin (LOVENOX) injection 40 mg  40 mg Subcutaneous Q24H Dwana Melena L, PA-C   40 mg at 03/03/17 0947  . insulin aspart (novoLOG) injection 0-9 Units  0-9 Units Subcutaneous TID WC Rondel Jumbo, PA-C   3 Units at 03/03/17 1252  . insulin glargine (LANTUS) injection 10 Units  10 Units Subcutaneous QHS Dessa Phi, DO      . ondansetron [facility]) tablet 4 mg  4 mg Oral Q8H PRN Dwana Melena L, PA-C   4 mg at 03/03/17 0438  . oxyCODONE (Oxy IR/ROXICODONE) immediate release tablet 5 mg  5 mg Oral Q4H PRN Prudencio Burly III, PA-C      . pantoprazole (PROTONIX) EC tablet 40 mg  40 mg Oral Daily Rondel Jumbo, PA-C   40 mg at 03/03/17 0962  . senna-docusate (Senokot-S) tablet 1 tablet  1 tablet Oral QHS PRN Rondel Jumbo, PA-C   1 tablet at 03/02/17 1233  . sodium phosphate (FLEET) 7-19 GM/118ML enema 1 enema  1 enema Rectal Once PRN Rondel Jumbo, PA-C         Discharge Medications: Please see discharge summary for a list of discharge medications.  Relevant Imaging Results:  Relevant Lab Results:   Additional Information SS# [place], [place]

## 2017-03-03 NOTE — Progress Notes (Signed)
Patient is unable to urinate after urinary catheter taken out. Bladder scan recorded 400 ml of urine. In and out catheter was done and 400 ml of urine collected at this time.

## 2017-03-04 LAB — CBC
HCT: 24.8 % — ABNORMAL LOW (ref 36.0–46.0)
Hemoglobin: 8 g/dL — ABNORMAL LOW (ref 12.0–15.0)
MCH: 26 pg (ref 26.0–34.0)
MCHC: 32.3 g/dL (ref 30.0–36.0)
MCV: 80.5 fL (ref 78.0–100.0)
PLATELETS: 313 10*3/uL (ref 150–400)
RBC: 3.08 MIL/uL — AB (ref 3.87–5.11)
RDW: 15.2 % (ref 11.5–15.5)
WBC: 23.3 10*3/uL — AB (ref 4.0–10.5)

## 2017-03-04 LAB — DIFFERENTIAL
Basophils Absolute: 0.1 10*3/uL (ref 0.0–0.1)
Basophils Relative: 0 %
EOS ABS: 0.2 10*3/uL (ref 0.0–0.7)
EOS PCT: 1 %
LYMPHS ABS: 4.5 10*3/uL — AB (ref 0.7–4.0)
Lymphocytes Relative: 20 %
MONO ABS: 2.8 10*3/uL — AB (ref 0.1–1.0)
MONOS PCT: 12 %
Neutro Abs: 15.5 10*3/uL — ABNORMAL HIGH (ref 1.7–7.7)
Neutrophils Relative %: 67 %

## 2017-03-04 LAB — BASIC METABOLIC PANEL
ANION GAP: 9 (ref 5–15)
BUN: 38 mg/dL — ABNORMAL HIGH (ref 6–20)
CALCIUM: 8 mg/dL — AB (ref 8.9–10.3)
CO2: 19 mmol/L — ABNORMAL LOW (ref 22–32)
CREATININE: 1.14 mg/dL — AB (ref 0.44–1.00)
Chloride: 101 mmol/L (ref 101–111)
GFR, EST AFRICAN AMERICAN: 49 mL/min — AB (ref >60)
GFR, EST NON AFRICAN AMERICAN: 43 mL/min — AB (ref >60)
Glucose, Bld: 211 mg/dL — ABNORMAL HIGH (ref 65–99)
Potassium: 5.3 mmol/L — ABNORMAL HIGH (ref 3.5–5.1)
SODIUM: 129 mmol/L — AB (ref 135–145)

## 2017-03-04 LAB — GLUCOSE, CAPILLARY
GLUCOSE-CAPILLARY: 214 mg/dL — AB (ref 65–99)
Glucose-Capillary: 361 mg/dL — ABNORMAL HIGH (ref 65–99)
Glucose-Capillary: 306 mg/dL — ABNORMAL HIGH (ref 65–99)
Glucose-Capillary: 276 mg/dL — ABNORMAL HIGH (ref 65–99)

## 2017-03-04 LAB — PHOSPHORUS: Phosphorus: 2.7 mg/dL (ref 2.5–4.6)

## 2017-03-04 LAB — MAGNESIUM: Magnesium: 2.1 mg/dL (ref 1.7–2.4)

## 2017-03-04 LAB — CK: Total CK: 839 U/L — ABNORMAL HIGH (ref 38–234)

## 2017-03-04 LAB — POTASSIUM: Potassium: 5.4 mmol/L — ABNORMAL HIGH (ref 3.5–5.1)

## 2017-03-04 MED ORDER — FUROSEMIDE 10 MG/ML IJ SOLN
40.0000 mg | Freq: Once | INTRAMUSCULAR | Status: AC
  Administered 2017-03-04: 40 mg via INTRAVENOUS
  Filled 2017-03-04: qty 4

## 2017-03-04 MED ORDER — INSULIN GLARGINE 100 UNIT/ML ~~LOC~~ SOLN
15.0000 [IU] | Freq: Every day | SUBCUTANEOUS | Status: DC
  Administered 2017-03-04: 15 [IU] via SUBCUTANEOUS
  Filled 2017-03-04: qty 0.15

## 2017-03-04 MED ORDER — SODIUM CHLORIDE 0.9 % IV BOLUS (SEPSIS)
1000.0000 mL | Freq: Once | INTRAVENOUS | Status: AC
  Administered 2017-03-04: 1000 mL via INTRAVENOUS

## 2017-03-04 MED ORDER — SODIUM CHLORIDE 0.9 % IV SOLN
INTRAVENOUS | Status: DC
  Administered 2017-03-05: via INTRAVENOUS

## 2017-03-04 NOTE — Progress Notes (Signed)
PROGRESS NOTE    [name]  ZJI:[id number] DOB: [birth year] DOA: 03/01/2017 PCP: Jinny Sanders, MD     Brief Narrative:  [name] is a 83 y.o. female with medical history significant for diabetes, hypertension, hyperlipidemia, anxiety, GERD, hypothyroidism, presenting after sustaining a mechanical fall onto her right hip. She also had right wrist pain and some swelling.  Patient reports trying to go to the bathroom, when she lost balance, with no loss of consciousness, or hitting her head.  She denies any dizziness, presyncope or syncope.  The patient denies any history of seizures.  She denies any chest pain, palpitations, or shortness of breath or cough.  She denies any abdominal pain, nausea or vomiting.  Denies any focal weakness, numbness or tingling.  The patient was immobilized, and a splint was placed on her right wrist as well.  Orthopedic surgery was consulted.  Assessment & Plan:   Active Problems:   Secondary DM with neurological manifestations ([place])   Diabetic peripheral neuropathy associated with type 2 diabetes mellitus ([place])   Hyperlipidemia   GERD   Chronic constipation   Falls frequently   Closed right hip fracture (HCC)   Right hip and right wrist fracture after mechanical fall -S/p IM intertrochanteric fixation of right hip, ORIF of right write 11/21 with Dr. Griffin Basil -PT OT recommending SNF  -Lovenox for 14 days postop for DVT ppx -Follow up in 10-14 days   Leukocytosis -Likely reactive to above -No sign of infectious etiology. UA negative. Blood cultures negative. Afebrile. No bands on differential   Hyperkalemia -Hold lisinopril, give lasix again and repeat BMP  DM type 2 -Well controlled. Ha1c 6.6  -Lantus, SSI   Hypertension  -Hold lisinopril for marginal BP   CKD stage 3 -Baseline Cr 1 -Stable    DVT prophylaxis: lovenox  Code Status: full Family Communication: no family at bedside Disposition Plan: SNF if BMP and WBC stable  tomorrow    Consultants:   Orthopedic surgery  Procedures:   Intramedullary nail intertrochanteric on right 11/21   Open reduction internal fixation write on right 11/21  Antimicrobials:  Anti-infectives (From admission, onward)   Start     Dose/Rate Route Frequency Ordered Stop   03/01/17 0815  ceFAZolin (ANCEF) IVPB 2g/100 mL premix     2 g 200 mL/hr over 30 Minutes Intravenous On call to O.R. 03/01/17 0757 03/01/17 0845       Subjective: Doing well.  No acute events overnight.  Pain seems to be better than yesterday   Objective: Vitals:   03/03/17 0426 03/03/17 1100 03/03/17 1945 03/04/17 0526  BP: 102/68 (!) 93/59 91/60 96/61   Pulse: (!) 126 (!) 101 96 (!) 106  Resp: 18 16 16 16   Temp: 98.2 F (36.8 C) 98.4 F (36.9 C) 98.3 F (36.8 C) 99 F (37.2 C)  TempSrc: Oral Oral Oral Oral  SpO2: 93% 98% 96% 95%  Weight:      Height:        Intake/Output Summary (Last 24 hours) at 03/04/2017 1233 Last data filed at 03/04/2017 0900 Gross per 24 hour  Intake 240 ml  Output 250 ml  Net -10 ml   Filed Weights   03/01/17 0354  Weight: 67.1 kg (148 lb)    Examination:  General exam: Appears calm and comfortable  Respiratory system: Clear to auscultation. Respiratory effort normal. Cardiovascular system: S1 & S2 heard, RRR. +murmur  Gastrointestinal system: Abdomen is nondistended, soft and nontender. No organomegaly or masses  felt. Normal bowel sounds heard. Central nervous system: Alert and oriented. No focal neurological deficits. Extremities: +right wrist in dressing/wrap, +right hip dressing clean  Skin: No rashes, lesions or ulcers Psychiatry: Judgement and insight appear normal. Mood & affect appropriate.   Data Reviewed: I have personally reviewed following labs and imaging studies  CBC: Recent Labs  Lab 03/01/17 0359 03/02/17 0347 03/02/17 1418 03/03/17 0709 03/04/17 0805 03/04/17 0953  WBC 19.5* 13.8*  --  15.8* 23.3*  --   NEUTROABS 15.2*   --   --   --   --  15.5*  HGB 12.5 9.7* 8.8* 8.6* 8.0*  --   HCT 38.5 30.3* 27.5* 27.0* 24.8*  --   MCV 81.6 81.5  --  81.8 80.5  --   PLT 327 323  --  268 313  --    Basic Metabolic Panel: Recent Labs  Lab 03/02/17 0347 03/02/17 1418 03/03/17 0709 03/03/17 1355 03/04/17 0620 03/04/17 0805 03/04/17 0953  NA 136 132* 131* 129* 129*  --   --   K 5.2* 4.6 5.6* 4.8 5.3*  --  5.4*  CL 106 103 102 102 101  --   --   CO2 21* 21* 22 20* 19*  --   --   GLUCOSE 213* 220* 319* 268* 211*  --   --   BUN 25* 21* 27* 27* 38*  --   --   CREATININE 1.05* 0.94 1.14* 1.07* 1.14*  --   --   CALCIUM 7.7* 7.7* 8.0* 7.7* 8.0*  --   --   MG  --   --   --   --   --  2.1  --   PHOS  --   --   --   --   --  2.7  --    GFR: Estimated Creatinine Clearance: 33.6 mL/min (A) (by C-G formula based on SCr of 1.14 mg/dL (H)). Liver Function Tests: Recent Labs  Lab 03/02/17 0347  AST 21  ALT 12*  ALKPHOS 68  BILITOT 0.8  PROT 5.6*  ALBUMIN 2.6*   No results for input(s): LIPASE, AMYLASE in the last 168 hours. No results for input(s): AMMONIA in the last 168 hours. Coagulation Profile: Recent Labs  Lab 03/01/17 0359 03/01/17 1555 03/02/17 0347  INR 0.94 1.00 1.07   Cardiac Enzymes: No results for input(s): CKTOTAL, CKMB, CKMBINDEX, TROPONINI in the last 168 hours. BNP (last 3 results) No results for input(s): PROBNP in the last 8760 hours. HbA1C: Recent Labs    03/01/17 1555  HGBA1C 6.6*   CBG: Recent Labs  Lab 03/03/17 1202 03/03/17 1603 03/03/17 2311 03/04/17 0617 03/04/17 1125  GLUCAP 245* 261* 230* 214* 361*   Lipid Profile: No results for input(s): CHOL, HDL, LDLCALC, TRIG, CHOLHDL, LDLDIRECT in the last 72 hours. Thyroid Function Tests: No results for input(s): TSH, T4TOTAL, FREET4, T3FREE, THYROIDAB in the last 72 hours. Anemia Panel: No results for input(s): VITAMINB12, FOLATE, FERRITIN, TIBC, IRON, RETICCTPCT in the last 72 hours. Sepsis Labs: No results for input(s):  PROCALCITON, LATICACIDVEN in the last 168 hours.  Recent Results (from the past 240 hour(s))  Culture, blood (Routine X 2) w Reflex to ID Panel     Status: None (Preliminary result)   Collection Time: 03/01/17  3:55 PM  Result Value Ref Range Status   Specimen Description BLOOD BLOOD LEFT WRIST  Final   Special Requests   Final    IN PEDIATRIC BOTTLE Blood Culture results may not  be optimal due to an excessive volume of blood received in culture bottles   Culture NO GROWTH 2 DAYS  Final   Report Status PENDING  Incomplete  Culture, blood (Routine X 2) w Reflex to ID Panel     Status: None (Preliminary result)   Collection Time: 03/01/17  4:03 PM  Result Value Ref Range Status   Specimen Description BLOOD RIGHT HAND  Final   Special Requests IN PEDIATRIC BOTTLE Blood Culture adequate volume  Final   Culture NO GROWTH 2 DAYS  Final   Report Status PENDING  Incomplete       Radiology Studies: No results found.    Scheduled Meds: . acetaminophen  1,000 mg Oral Q8H  . calcium carbonate  1 tablet Oral Q breakfast  . enoxaparin (LOVENOX) injection  40 mg Subcutaneous Q24H  . insulin aspart  0-9 Units Subcutaneous TID WC  . insulin glargine  10 Units Subcutaneous QHS  . pantoprazole  40 mg Oral Daily   Continuous Infusions:    LOS: 3 days    Time spent: 30 minutes   Dessa Phi, DO [facility] [email or web address] Password [facility] 03/04/2017, 12:33 PM

## 2017-03-04 NOTE — Plan of Care (Signed)
  Progressing Education: Knowledge of General Education information will improve 03/04/2017 1105 - Progressing by Rance Muir, RN Health Behavior/Discharge Planning: Ability to manage health-related needs will improve 03/04/2017 1105 - Progressing by Rance Muir, RN Clinical Measurements: Ability to maintain clinical measurements within normal limits will improve 03/04/2017 1105 - Progressing by Rance Muir, RN Will remain free from infection 03/04/2017 1105 - Progressing by Rance Muir, RN Diagnostic test results will improve 03/04/2017 1105 - Progressing by Rance Muir, RN Respiratory complications will improve 03/04/2017 1105 - Progressing by Rance Muir, RN Cardiovascular complication will be avoided 03/04/2017 1105 - Progressing by Rance Muir, RN Activity: Risk for activity intolerance will decrease 03/04/2017 1105 - Progressing by Rance Muir, RN Nutrition: Adequate nutrition will be maintained 03/04/2017 1105 - Progressing by Rance Muir, RN Coping: Level of anxiety will decrease 03/04/2017 1105 - Progressing by Rance Muir, RN Elimination: Will not experience complications related to bowel motility 03/04/2017 1105 - Progressing by Rance Muir, RN Will not experience complications related to urinary retention 03/04/2017 1105 - Progressing by Rance Muir, RN Pain Managment: General experience of comfort will improve 03/04/2017 1105 - Progressing by Rance Muir, RN Safety: Ability to remain free from injury will improve 03/04/2017 1105 - Progressing by Rance Muir, RN Skin Integrity: Risk for impaired skin integrity will decrease 03/04/2017 1105 - Progressing by Rance Muir, RN Education: Verbalization of understanding the information provided (i.e., activity precautions, restrictions, etc) will improve 03/04/2017 1105 - Progressing by Rance Muir, RN Activity: Ability to ambulate and perform ADLs will improve 03/04/2017 1105 - Progressing by Rance Muir, RN Clinical Measurements: Postoperative  complications will be avoided or minimized 03/04/2017 1105 - Progressing by Rance Muir, RN Self-Concept: Ability to maintain and perform role responsibilities to the fullest extent possible will improve 03/04/2017 1105 - Progressing by Rance Muir, RN Pain Management: Pain level will decrease 03/04/2017 1105 - Progressing by Rance Muir, RN

## 2017-03-04 NOTE — Progress Notes (Signed)
Patient bladder scanned for 560mL.  In and out catheterization done per MD order.  Sterile technique maintained.  600 mL clear yellow urine obtained.  Patient tolerated procedure without difficulty.

## 2017-03-04 NOTE — Progress Notes (Signed)
ORTHOPAEDIC PROGRESS NOTE  s/p Procedure(s): INTRAMEDULLARY (IM) NAIL INTERTROCHANTRIC OPEN REDUCTION INTERNAL FIXATION (ORIF) WRIST FRACTURE  SUBJECTIVE: Reports mild pain about operative site. No chest pain. No SOB. No nausea/vomiting. No other complaints.  OBJECTIVE: PE:RUE: wwp hand, distal motor and sensory preserved, splint CDI RLE: incision CDI,warm well perfused foot, intact EHL/TA/GSC   Vitals:   03/03/17 1945 03/04/17 0526  BP: 91/60 96/61  Pulse: 96 (!) 106  Resp: 16 16  Temp: 98.3 F (36.8 C) 99 F (37.2 C)  SpO2: 96% 95%     ASSESSMENT: [name] is a 83 y.o. female doing well postoperatively.  PLAN: Weightbearing: WBAT RLE, WBAT R elbow, NWB R hand - ok for platform walker Insicional and dressing care: OK to remove dressings from hip POD3 and leave open to air with dry gauze PRN, Wrist splint intact until clinic in 2 weeks Orthopedic device(s): None Showering: OK to shower POD3, keep splint on wrist dry VTE prophylaxis: Lovenox 40mg  qd 2 weeks Pain control: Standing tylenol, breakthrough narcotics Follow - up plan: 2 weeks Contact information:  Weekdays 8-5 Ophelia Charter MD [phone], After hours and holidays please check [email or web address] for group call information for [facility]

## 2017-03-04 NOTE — Plan of Care (Signed)
  Progressing Education: Knowledge of General Education information will improve 03/04/2017 1151 - Progressing by Rance Muir, RN 03/04/2017 1105 - Progressing by Rance Muir, RN Health Behavior/Discharge Planning: Ability to manage health-related needs will improve 03/04/2017 1151 - Progressing by Rance Muir, RN 03/04/2017 1105 - Progressing by Rance Muir, RN Clinical Measurements: Ability to maintain clinical measurements within normal limits will improve 03/04/2017 1151 - Progressing by Rance Muir, RN 03/04/2017 1105 - Progressing by Rance Muir, RN Will remain free from infection 03/04/2017 1151 - Progressing by Rance Muir, RN 03/04/2017 1105 - Progressing by Rance Muir, RN Diagnostic test results will improve 03/04/2017 1151 - Progressing by Rance Muir, RN 03/04/2017 1105 - Progressing by Rance Muir, RN Respiratory complications will improve 03/04/2017 1151 - Progressing by Rance Muir, RN 03/04/2017 1105 - Progressing by Rance Muir, RN Cardiovascular complication will be avoided 03/04/2017 1151 - Progressing by Rance Muir, RN 03/04/2017 1105 - Progressing by Rance Muir, RN Activity: Risk for activity intolerance will decrease 03/04/2017 1151 - Progressing by Rance Muir, RN 03/04/2017 1105 - Progressing by Rance Muir, RN Nutrition: Adequate nutrition will be maintained 03/04/2017 1151 - Progressing by Rance Muir, RN 03/04/2017 1105 - Progressing by Rance Muir, RN Coping: Level of anxiety will decrease 03/04/2017 1151 - Progressing by Rance Muir, RN 03/04/2017 1105 - Progressing by Rance Muir, RN Elimination: Will not experience complications related to bowel motility 03/04/2017 1151 - Progressing by Rance Muir, RN 03/04/2017 1105 - Progressing by Rance Muir, RN Will not experience complications related to urinary retention 03/04/2017 1151 - Progressing by Rance Muir, RN 03/04/2017 1105 - Progressing by Rance Muir, RN Pain Managment: General experience of comfort will improve 03/04/2017 1151 -  Progressing by Rance Muir, RN 03/04/2017 1105 - Progressing by Rance Muir, RN Safety: Ability to remain free from injury will improve 03/04/2017 1151 - Progressing by Rance Muir, RN 03/04/2017 1105 - Progressing by Rance Muir, RN Skin Integrity: Risk for impaired skin integrity will decrease 03/04/2017 1151 - Progressing by Rance Muir, RN 03/04/2017 1105 - Progressing by Rance Muir, RN Education: Verbalization of understanding the information provided (i.e., activity precautions, restrictions, etc) will improve 03/04/2017 1151 - Progressing by Rance Muir, RN 03/04/2017 1105 - Progressing by Rance Muir, RN Activity: Ability to ambulate and perform ADLs will improve 03/04/2017 1151 - Progressing by Rance Muir, RN 03/04/2017 1105 - Progressing by Rance Muir, RN Clinical Measurements: Postoperative complications will be avoided or minimized 03/04/2017 1151 - Progressing by Rance Muir, RN 03/04/2017 1105 - Progressing by Rance Muir, RN Self-Concept: Ability to maintain and perform role responsibilities to the fullest extent possible will improve 03/04/2017 1151 - Progressing by Rance Muir, RN 03/04/2017 1105 - Progressing by Rance Muir, RN Pain Management: Pain level will decrease 03/04/2017 1151 - Progressing by Rance Muir, RN 03/04/2017 1105 - Progressing by Rance Muir, RN

## 2017-03-05 ENCOUNTER — Inpatient Hospital Stay (HOSPITAL_COMMUNITY): Payer: PPO

## 2017-03-05 DIAGNOSIS — R9431 Abnormal electrocardiogram [ECG] [EKG]: Secondary | ICD-10-CM

## 2017-03-05 DIAGNOSIS — R0600 Dyspnea, unspecified: Secondary | ICD-10-CM

## 2017-03-05 LAB — TROPONIN I: TROPONIN I: 7.46 ng/mL — AB (ref <0.03)

## 2017-03-05 LAB — MRSA PCR SCREENING: MRSA BY PCR: NEGATIVE

## 2017-03-05 LAB — DIC (DISSEMINATED INTRAVASCULAR COAGULATION) PANEL
APTT: 27 s (ref 24–36)
FIBRINOGEN: 775 mg/dL — AB (ref 210–475)
PROTHROMBIN TIME: 14.9 s (ref 11.4–15.2)
SMEAR REVIEW: NONE SEEN

## 2017-03-05 LAB — LACTIC ACID, PLASMA: Lactic Acid, Venous: 4.1 mmol/L (ref 0.5–1.9)

## 2017-03-05 LAB — BASIC METABOLIC PANEL
ANION GAP: 9 (ref 5–15)
BUN: 58 mg/dL — AB (ref 6–20)
CHLORIDE: 100 mmol/L — AB (ref 101–111)
CO2: 18 mmol/L — ABNORMAL LOW (ref 22–32)
Calcium: 7.7 mg/dL — ABNORMAL LOW (ref 8.9–10.3)
Creatinine, Ser: 1.32 mg/dL — ABNORMAL HIGH (ref 0.44–1.00)
GFR calc Af Amer: 41 mL/min — ABNORMAL LOW (ref >60)
GFR, EST NON AFRICAN AMERICAN: 36 mL/min — AB (ref >60)
GLUCOSE: 273 mg/dL — AB (ref 65–99)
POTASSIUM: 5.6 mmol/L — AB (ref 3.5–5.1)
Sodium: 127 mmol/L — ABNORMAL LOW (ref 135–145)

## 2017-03-05 LAB — DIC (DISSEMINATED INTRAVASCULAR COAGULATION)PANEL
D-Dimer, Quant: 2.17 ug/mL-FEU — ABNORMAL HIGH (ref 0.00–0.50)
INR: 1.18
Platelets: 175 10*3/uL (ref 150–400)

## 2017-03-05 LAB — IRON AND TIBC
Iron: 35 ug/dL (ref 28–170)
SATURATION RATIOS: 13 % (ref 10.4–31.8)
TIBC: 272 ug/dL (ref 250–450)
UIBC: 237 ug/dL

## 2017-03-05 LAB — CBC
HEMATOCRIT: 23.1 % — AB (ref 36.0–46.0)
HEMOGLOBIN: 7.5 g/dL — AB (ref 12.0–15.0)
MCH: 26 pg (ref 26.0–34.0)
MCHC: 32.5 g/dL (ref 30.0–36.0)
MCV: 80.2 fL (ref 78.0–100.0)
PLATELETS: 340 10*3/uL (ref 150–400)
RBC: 2.88 MIL/uL — AB (ref 3.87–5.11)
RDW: 15.1 % (ref 11.5–15.5)
WBC: 23.5 10*3/uL — AB (ref 4.0–10.5)

## 2017-03-05 LAB — OCCULT BLOOD X 1 CARD TO LAB, STOOL: FECAL OCCULT BLD: POSITIVE — AB

## 2017-03-05 LAB — OSMOLALITY: Osmolality: 305 mOsm/kg — ABNORMAL HIGH (ref 275–295)

## 2017-03-05 LAB — TSH: TSH: 0.294 u[IU]/mL — ABNORMAL LOW (ref 0.350–4.500)

## 2017-03-05 LAB — VITAMIN B12: VITAMIN B 12: 637 pg/mL (ref 180–914)

## 2017-03-05 LAB — GLUCOSE, CAPILLARY
GLUCOSE-CAPILLARY: 365 mg/dL — AB (ref 65–99)
GLUCOSE-CAPILLARY: 249 mg/dL — AB (ref 65–99)
Glucose-Capillary: 258 mg/dL — ABNORMAL HIGH (ref 65–99)
Glucose-Capillary: 184 mg/dL — ABNORMAL HIGH (ref 65–99)

## 2017-03-05 LAB — BRAIN NATRIURETIC PEPTIDE: B Natriuretic Peptide: 3581.4 pg/mL — ABNORMAL HIGH (ref 0.0–100.0)

## 2017-03-05 LAB — FERRITIN: Ferritin: 316 ng/mL — ABNORMAL HIGH (ref 11–307)

## 2017-03-05 LAB — PREPARE RBC (CROSSMATCH)

## 2017-03-05 LAB — LACTATE DEHYDROGENASE: LDH: 401 U/L — AB (ref 98–192)

## 2017-03-05 LAB — T4, FREE: FREE T4: 1.1 ng/dL (ref 0.61–1.12)

## 2017-03-05 LAB — FIBRINOGEN: FIBRINOGEN: 766 mg/dL — AB (ref 210–475)

## 2017-03-05 LAB — CORTISOL-AM, BLOOD: Cortisol - AM: 37.8 ug/dL — ABNORMAL HIGH (ref 6.7–22.6)

## 2017-03-05 MED ORDER — ORAL CARE MOUTH RINSE
15.0000 mL | Freq: Two times a day (BID) | OROMUCOSAL | Status: DC
  Administered 2017-03-05 – 2017-03-17 (×20): 15 mL via OROMUCOSAL

## 2017-03-05 MED ORDER — INSULIN ASPART 100 UNIT/ML ~~LOC~~ SOLN: 0.0000 [IU] | Freq: Every day | SUBCUTANEOUS | Status: DC

## 2017-03-05 MED ORDER — GLUCERNA SHAKE PO LIQD
237.0000 mL | Freq: Two times a day (BID) | ORAL | Status: DC
  Administered 2017-03-06 – 2017-03-17 (×19): 237 mL via ORAL
  Filled 2017-03-05 (×10): qty 237

## 2017-03-05 MED ORDER — SODIUM CHLORIDE 0.9 % IV SOLN
Freq: Once | INTRAVENOUS | Status: AC
  Administered 2017-03-05: 15:00:00 via INTRAVENOUS

## 2017-03-05 MED ORDER — SODIUM POLYSTYRENE SULFONATE 15 GM/60ML PO SUSP
30.0000 g | Freq: Once | ORAL | Status: AC
  Administered 2017-03-05: 30 g via ORAL
  Filled 2017-03-05 (×2): qty 120

## 2017-03-05 MED ORDER — FUROSEMIDE 10 MG/ML IJ SOLN
40.0000 mg | Freq: Once | INTRAMUSCULAR | Status: AC
  Administered 2017-03-05: 40 mg via INTRAVENOUS
  Filled 2017-03-05: qty 4

## 2017-03-05 MED ORDER — INSULIN GLARGINE 100 UNIT/ML ~~LOC~~ SOLN
20.0000 [IU] | Freq: Every day | SUBCUTANEOUS | Status: DC
  Administered 2017-03-05: 20 [IU] via SUBCUTANEOUS
  Filled 2017-03-05 (×2): qty 0.2

## 2017-03-05 MED ORDER — INSULIN ASPART 100 UNIT/ML ~~LOC~~ SOLN
0.0000 [IU] | Freq: Three times a day (TID) | SUBCUTANEOUS | Status: DC
  Administered 2017-03-05: 5 [IU] via SUBCUTANEOUS
  Administered 2017-03-05: 15 [IU] via SUBCUTANEOUS
  Administered 2017-03-05: 8 [IU] via SUBCUTANEOUS
  Administered 2017-03-06 (×2): 5 [IU] via SUBCUTANEOUS
  Administered 2017-03-06: 8 [IU] via SUBCUTANEOUS
  Administered 2017-03-07: 15 [IU] via SUBCUTANEOUS
  Administered 2017-03-07: 2 [IU] via SUBCUTANEOUS
  Administered 2017-03-07: 3 [IU] via SUBCUTANEOUS
  Administered 2017-03-08 (×2): 2 [IU] via SUBCUTANEOUS
  Administered 2017-03-09 – 2017-03-11 (×4): 3 [IU] via SUBCUTANEOUS
  Administered 2017-03-11: 2 [IU] via SUBCUTANEOUS
  Administered 2017-03-12 (×2): 3 [IU] via SUBCUTANEOUS
  Administered 2017-03-13 – 2017-03-14 (×3): 2 [IU] via SUBCUTANEOUS
  Administered 2017-03-14 – 2017-03-15 (×2): 3 [IU] via SUBCUTANEOUS
  Administered 2017-03-16 (×3): 2 [IU] via SUBCUTANEOUS
  Administered 2017-03-17: 3 [IU] via SUBCUTANEOUS

## 2017-03-05 MED ORDER — POLYETHYLENE GLYCOL 3350 17 G PO PACK
17.0000 g | PACK | Freq: Every day | ORAL | Status: DC
  Administered 2017-03-07 – 2017-03-17 (×10): 17 g via ORAL
  Filled 2017-03-05 (×11): qty 1

## 2017-03-05 MED ORDER — CARVEDILOL 3.125 MG PO TABS
3.1250 mg | ORAL_TABLET | Freq: Two times a day (BID) | ORAL | Status: DC
  Administered 2017-03-05 – 2017-03-08 (×6): 3.125 mg via ORAL
  Filled 2017-03-05 (×6): qty 1

## 2017-03-05 MED ORDER — FUROSEMIDE 10 MG/ML IJ SOLN
40.0000 mg | Freq: Two times a day (BID) | INTRAMUSCULAR | Status: DC
  Administered 2017-03-06 – 2017-03-10 (×9): 40 mg via INTRAVENOUS
  Filled 2017-03-05 (×9): qty 4

## 2017-03-05 NOTE — Consult Note (Signed)
CARDIOLOGY CONSULT NOTE       Patient ID: BENTLIE WITHEM MRN: [id number] DOB/AGE: 1929/09/15 83 y.o.  Admit date: 03/01/2017 Referring Physician: Maylene Roes Primary Physician: Jinny Sanders, MD Primary Cardiologist: Burt Knack Reason for Consultation: Dyspnea and abnormal ECG  Active Problems:   Secondary DM with neurological manifestations [facility])   Diabetic peripheral neuropathy associated with type 2 diabetes mellitus ([place])   Hyperlipidemia   GERD   Chronic constipation   Falls frequently   Closed right hip fracture (H[facility])   HPI:  83 y.o. frail female 4 days post op from right wrist surgery after mechanical fall. History of HTN, PVD. Seen  By Dr Burt Knack 2014 with atypical chest pain with normal myouve and echo. Had some constitutional symptoms with fibrates for HLD. She sees Dr Oneida Alar for PVD. She has significant claudication with moderate bilateral reduction in  ABI's .60 bilaterally. She has had hyperkalemia post op with ACE held and diuretic given. More dyspnea last 48 hours No chest pain. CXR, BNP, troponin pending. Echo done 09/14/15 showed EF 60-65% with moderate AS mean gradient 24 mmHg peak 41 mmhg She has significant post op anemia with Hct of 23 Hct 3 months ago was 41.9 No melena Currently on DVT lovenox. Hct has been falling every day for last 5 days.    ROS All other systems reviewed and negative except as noted above  Past Medical History:  Diagnosis Date  . Abdominal pain, epigastric   . Anxiety   . Corns and callosities   . Diabetes mellitus   . Diaphragmatic hernia without mention of obstruction or gangrene   . Diverticulosis of colon (without mention of hemorrhage) 2009  . Falls frequently   . Fibromyalgia   . GERD (gastroesophageal reflux disease)   . Hiatal hernia   . History of colon polyps 2009   Hyperplastic  . Hx of adenomatous colonic polyps 2009  . Hyperlipidemia   . IBS (irritable bowel syndrome)   . Internal hemorrhoids without mention of  complication   . Rash and other nonspecific skin eruption   . Status post dilation of esophageal narrowing   . Thyroid disease   . Type II or unspecified type diabetes mellitus with neurological manifestations, not stated as uncontrolled(250.60)     Family History  Problem Relation Age of Onset  . Uterine cancer Mother   . Cancer Mother   . Diabetes Maternal Grandfather   . Diabetes Son   . Hyperlipidemia Son   . Heart disease Paternal Grandmother   . Breast cancer Paternal Aunt   . Colon cancer Neg Hx   . Stomach cancer Neg Hx     Social History   Socioeconomic History  . Marital status: Widowed    Spouse name: Not on file  . Number of children: 3  . Years of education: Not on file  . Highest education level: Not on file  Social Needs  . Financial resource strain: Not on file  . Food insecurity - worry: Not on file  . Food insecurity - inability: Not on file  . Transportation needs - medical: Not on file  . Transportation needs - non-medical: Not on file  Occupational History  . Occupation: retired    Fish farm manager: RETIRED  Tobacco Use  . Smoking status: Former Smoker    Packs/day: 0.25    Years: 4.00    Pack years: 1.00    Types: Cigarettes    Last attempt to quit: 03/10/1981    Years since quitting:  36.0  . Smokeless tobacco: Never Used  Substance and Sexual Activity  . Alcohol use: No    Alcohol/week: 0.0 oz  . Drug use: No  . Sexual activity: No  Other Topics Concern  . Not on file  Social History Narrative   Widowed   3 children          Past Surgical History:  Procedure Laterality Date  . ABDOMINAL HYSTERECTOMY    . BASAL CELL CARCINOMA EXCISION  04/2016   corner of left eye  . bmd  2007  . CATARACT EXTRACTION    . ESOPHAGOGASTRODUODENOSCOPY  2007  . HERNIA REPAIR    . INTRAMEDULLARY (IM) NAIL INTERTROCHANTERIC Right 03/01/2017   Procedure: INTRAMEDULLARY (IM) NAIL INTERTROCHANTRIC;  Surgeon: Hiram Gash, MD;  Location: [place];  Service:  Orthopedics;  Laterality: Right;  . LAPAROSCOPIC NISSEN FUNDOPLICATION  1740  . ORIF WRIST FRACTURE Right 03/01/2017   Procedure: OPEN REDUCTION INTERNAL FIXATION (ORIF) WRIST FRACTURE;  Surgeon: Hiram Gash, MD;  Location: [place];  Service: Orthopedics;  Laterality: Right;  . THYROIDECTOMY, PARTIAL       . acetaminophen  1,000 mg Oral Q8H  . enoxaparin (LOVENOX) injection  40 mg Subcutaneous Q24H  . feeding supplement (GLUCERNA SHAKE)  237 mL Oral BID BM  . insulin aspart  0-15 Units Subcutaneous TID WC  . insulin aspart  0-5 Units Subcutaneous QHS  . insulin glargine  20 Units Subcutaneous QHS  . pantoprazole  40 mg Oral Daily  . sodium polystyrene  30 g Oral Once   . sodium chloride 100 mL/hr at 03/05/17 0016    Physical Exam: Blood pressure (!) 112/58, pulse (!) 111, temperature 97.7 F (36.5 C), temperature source Oral, resp. rate 20, height 5' 3.5" (1.613 m), weight 166 lb 10.7 oz (75.6 kg), SpO2 97 %.   Affect appropriate Pale elderly female  HEENT: normal Neck supple with no adenopathy JVP normal no bruits no thyromegaly Lungs clear with no wheezing and good diaphragmatic motion Heart:  S1/S2 AS  murmur, no rub, gallop or click PMI normal Abdomen: benighn, BS positve, no tenderness, no AAA Femoral  bruit.  No HSM or HJR Distal pulses hard to palpate in feet  No edema Neuro non-focal Skin warm and dry No muscular weakness Foley  Cast on right arm    Labs:   Lab Results  Component Value Date   WBC 23.5 (H) 03/05/2017   HGB 7.5 (L) 03/05/2017   HCT 23.1 (L) 03/05/2017   MCV 80.2 03/05/2017   PLT 175 03/05/2017    Recent Labs  Lab 03/02/17 0347  03/05/17 0425  NA 136   < > 127*  K 5.2*   < > 5.6*  CL 106   < > 100*  CO2 21*   < > 18*  BUN 25*   < > 58*  CREATININE 1.05*   < > 1.32*  CALCIUM 7.7*   < > 7.7*  PROT 5.6*  --   --   BILITOT 0.8  --   --   ALKPHOS 68  --   --   ALT 12*  --   --   AST 21  --   --   GLUCOSE 213*   < > 273*   < > =  values in this interval not displayed.   Lab Results  Component Value Date   CXKGYJE 563 (H) 03/04/2017    Lab Results  Component Value Date   CHOL 248 (H)  06/23/2016   CHOL 237 (H) 08/17/2015   CHOL 224 (H) 02/13/2015   Lab Results  Component Value Date   HDL 38.40 (L) 06/23/2016   HDL 38.20 (L) 08/17/2015   HDL 38.10 (L) 02/13/2015   Lab Results  Component Value Date   LDLCALC 79 04/13/2007   Lab Results  Component Value Date   TRIG (H) 06/23/2016    406.0 Triglyceride is over 400; calculations on Lipids are invalid.   TRIG 383.0 (H) 08/17/2015   TRIG 210.0 (H) 02/13/2015   Lab Results  Component Value Date   CHOLHDL 6 06/23/2016   CHOLHDL 6 08/17/2015   CHOLHDL 6 02/13/2015   Lab Results  Component Value Date   LDLDIRECT 101.0 06/23/2016   LDLDIRECT 97.0 08/17/2015   LDLDIRECT 127.0 02/13/2015      Radiology: Dg Chest 1 View  Result Date: 03/01/2017 CLINICAL DATA:  [place] this morning. EXAM: CHEST 1 VIEW COMPARISON:  04/20/2012 FINDINGS: Cardiac enlargement. No vascular congestion or edema. Linear fibrosis or atelectasis in the lung bases. No focal consolidation. No blunting of costophrenic angles. No pneumothorax. Calcification of the aorta. Degenerative changes in the spine and shoulders. IMPRESSION: Fibrosis or atelectasis in the lung bases.  No focal consolidation. Electronically Signed   By: Lucienne Capers M.D.   On: 03/01/2017 05:04   Dg Wrist 2 Views Right  Result Date: 03/02/2017 CLINICAL DATA:  Open reduction internal fixation for fracture EXAM: RIGHT WRIST - 2 VIEW COMPARISON:  March 01, 2017 FINDINGS: Frontal and lateral views were obtained. There is screw and plate fixation through a fracture of the distal radius with alignment near anatomic. There is avulsion the ulnar styloid. No new fracture. No dislocation. Joint spaces appear remarkable overall. IMPRESSION: Screw and plate fixation through comminuted fracture distal radial metaphysis with  alignment near anatomic. Avulsion ulnar styloid. No new fracture. No dislocation. The joint spaces overall appear unremarkable. Electronically Signed   By: Lowella Grip III M.D.   On: 03/02/2017 08:15   Dg Wrist Complete Right  Result Date: 03/01/2017 CLINICAL DATA:  Fall with right wrist pain EXAM: RIGHT WRIST - COMPLETE 3+ VIEW COMPARISON:  None. FINDINGS: There is a impacted, comminuted fracture of the distal right radius. There is a minimally displaced fracture of the right ulnar styloid. There is moderate circumferential soft tissue swelling. No carpal fracture. IMPRESSION: Impacted, comminuted fracture of the distal right radius and minimally displaced fracture of the ulnar styloid. Electronically Signed   By: Ulyses Jarred M.D.   On: 03/01/2017 05:01   Dg Abd 1 View  Result Date: 03/05/2017 CLINICAL DATA:  Constipation. EXAM: ABDOMEN - 1 VIEW COMPARISON:  None. FINDINGS: The bowel gas pattern is normal. Moderate amount of stool seen throughout the colon and rectum. Atherosclerosis of abdominal aorta is noted. No nephrolithiasis is noted. IMPRESSION: Moderate stool burden is noted. No evidence of bowel obstruction or ileus. Electronically Signed   By: Marijo Conception, M.D.   On: 03/05/2017 09:41   Dg Hand Complete Right  Result Date: 03/01/2017 CLINICAL DATA:  Fall EXAM: RIGHT HAND - COMPLETE 3+ VIEW COMPARISON:  None. FINDINGS: The bones are osteopenic. There is no fracture or dislocation of the bones of the right hand. Fracture of the right wrist is characterized more completely on the dedicated wrist study. No advanced osteoarthrosis. No erosions. IMPRESSION: Osteopenia without acute fracture or dislocation of the right hand. Electronically Signed   By: Ulyses Jarred M.D.   On: 03/01/2017 04:59   Dg  C-arm 1-60 Min  Result Date: 03/01/2017 CLINICAL DATA:  Right femoral nail EXAM: DG C-ARM 61-120 MIN; RIGHT FEMUR 2 VIEWS COMPARISON:  03/01/2017 FINDINGS: Multiple intraoperative spot  images demonstrate internal fixation across the proximal right femoral fracture. No hardware complicating feature. Near anatomic alignment. IMPRESSION: Internal fixation across the proximal right femoral fracture. Electronically Signed   By: Rolm Baptise M.D.   On: 03/01/2017 11:42   Dg Hip Unilat With Pelvis 2-3 Views Right  Result Date: 03/01/2017 CLINICAL DATA:  Status post ORIF of right hip fracture EXAM: DG HIP (WITH OR WITHOUT PELVIS) 3V RIGHT COMPARISON:  03/01/2017 FINDINGS: Medullary rod and compression screw are now seen in the proximal right femur. Fracture fragments are in near anatomic alignment. Pelvic ring is intact. No other focal abnormality is seen. IMPRESSION: Status post ORIF of right femoral fracture Electronically Signed   By: Inez Catalina M.D.   On: 03/01/2017 14:00   Dg Hip Unilat W Or Wo Pelvis 2-3 Views Right  Result Date: 03/01/2017 CLINICAL DATA:  Lateral right hip pain after a fall this morning. EXAM: DG HIP (WITH OR WITHOUT PELVIS) 2-3V RIGHT COMPARISON:  None. FINDINGS: Comminuted inter trochanteric fractures of the proximal right femur with varus angulation of the fracture fragments. Displacement of greater and lesser trochanteric fragments. No dislocation at the hip joint. Degenerative changes in the lower lumbar spine and in both hips. Pelvis appears intact. SI joints and symphysis pubis are not displaced. Prominent vascular calcifications. IMPRESSION: Acute comminuted inter trochanteric fractures of the proximal right femur with varus angulation. Electronically Signed   By: Lucienne Capers M.D.   On: 03/01/2017 05:03   Dg Femur, Min 2 Views Right  Result Date: 03/01/2017 CLINICAL DATA:  Right femoral nail EXAM: DG C-ARM 61-120 MIN; RIGHT FEMUR 2 VIEWS COMPARISON:  03/01/2017 FINDINGS: Multiple intraoperative spot images demonstrate internal fixation across the proximal right femoral fracture. No hardware complicating feature. Near anatomic alignment. IMPRESSION:  Internal fixation across the proximal right femoral fracture. Electronically Signed   By: Rolm Baptise M.D.   On: 03/01/2017 11:42    EKG:  ST rate 114 marked ST depression in anterolateral leads change from 03/01/17 when she had LVH With insignificant q waves in inferior leads   ASSESSMENT AND PLAN:  Dyspnea:  She appears to have progressive and symptomatic anemia. Would transfuse to units with lasix CXR, troponin BNP ordered. Will also update echo . Known vascular disease with moderate AS and post op tachycardia with anemia may cause subendocardial ischemia. Will add low dose coreg. Would not use heparin due to anemia guaic stools   AS:  Moderate by echo 09/14/15 may be severe now contributing to symptoms and increased risk of AV malformations in GI tract  PVD:  Femoral bruits chronic f/u Dr Oneida Alar  She is not a candidate for cath at this time.    SignedJenkins Rouge 03/05/2017, 1:56 PM

## 2017-03-05 NOTE — Progress Notes (Signed)
ORTHOPAEDIC PROGRESS NOTE  s/p Procedure(s): INTRAMEDULLARY (IM) NAIL INTERTROCHANTRIC OPEN REDUCTION INTERNAL FIXATION (ORIF) WRIST FRACTURE  SUBJECTIVE: NAD, medical team examining patient at time of arrival  OBJECTIVE: PE:RUE: wwp hand, distal motor and sensory preserved, splint CDI RLE: incision CDI,warm well perfused foot, intact EHL/TA/GSC   Vitals:   03/04/17 2100 03/05/17 0452  BP: (!) 113/58 (!) 112/58  Pulse: (!) 116 (!) 111  Resp: (!) 22 20  Temp: 97.7 F (36.5 C) 97.7 F (36.5 C)  SpO2: 94% 97%     ASSESSMENT: [name] is a 83 y.o. female doing well postoperatively.  PLAN: Weightbearing: WBAT RLE, WBAT R elbow, NWB R hand - ok for platform walker Insicional and dressing care: OK to remove dressings from hip POD3 and leave open to air with dry gauze PRN, Wrist splint intact until clinic in 2 weeks Orthopedic device(s): None Showering: OK to shower POD3, keep splint on wrist dry VTE prophylaxis: Lovenox 40mg  qd 2 weeks Pain control: Standing tylenol, breakthrough narcotics Follow - up plan: 2 weeks with myself. Contact information:  Weekdays 8-5 Ophelia Charter MD [phone], After hours and holidays please check [email or web address] for group call information for [facility]

## 2017-03-05 NOTE — Plan of Care (Signed)
  Progressing Education: Knowledge of General Education information will improve 03/05/2017 1018 - Progressing by Rance Muir, RN Health Behavior/Discharge Planning: Ability to manage health-related needs will improve 03/05/2017 1018 - Progressing by Rance Muir, RN Clinical Measurements: Ability to maintain clinical measurements within normal limits will improve 03/05/2017 1018 - Progressing by Rance Muir, RN Will remain free from infection 03/05/2017 1018 - Progressing by Rance Muir, RN Diagnostic test results will improve 03/05/2017 1018 - Progressing by Rance Muir, RN Respiratory complications will improve 03/05/2017 1018 - Progressing by Rance Muir, RN Cardiovascular complication will be avoided 03/05/2017 1018 - Progressing by Rance Muir, RN Activity: Risk for activity intolerance will decrease 03/05/2017 1018 - Progressing by Rance Muir, RN Nutrition: Adequate nutrition will be maintained 03/05/2017 1018 - Progressing by Rance Muir, RN Coping: Level of anxiety will decrease 03/05/2017 1018 - Progressing by Rance Muir, RN Elimination: Will not experience complications related to bowel motility 03/05/2017 1018 - Progressing by Rance Muir, RN Will not experience complications related to urinary retention 03/05/2017 1018 - Progressing by Rance Muir, RN Pain Managment: General experience of comfort will improve 03/05/2017 1018 - Progressing by Rance Muir, RN Safety: Ability to remain free from injury will improve 03/05/2017 1018 - Progressing by Rance Muir, RN Skin Integrity: Risk for impaired skin integrity will decrease 03/05/2017 1018 - Progressing by Rance Muir, RN Education: Verbalization of understanding the information provided (i.e., activity precautions, restrictions, etc) will improve 03/05/2017 1018 - Progressing by Rance Muir, RN Activity: Ability to ambulate and perform ADLs will improve 03/05/2017 1018 - Progressing by Rance Muir, RN Clinical Measurements: Postoperative  complications will be avoided or minimized 03/05/2017 1018 - Progressing by Rance Muir, RN Self-Concept: Ability to maintain and perform role responsibilities to the fullest extent possible will improve 03/05/2017 1018 - Progressing by Rance Muir, RN Pain Management: Pain level will decrease 03/05/2017 1018 - Progressing by Rance Muir, RN

## 2017-03-05 NOTE — Progress Notes (Signed)
Dr Maylene Roes paged with critical lactic acid of 4.1 at this time

## 2017-03-05 NOTE — Progress Notes (Signed)
Dr. Maylene Roes contacted in regards to troponin level of 7.46.

## 2017-03-05 NOTE — Progress Notes (Signed)
CXR with small effusion Troponin 7 BNP elevated Order for transfusion discussed with Dr Maylene Roes Lasix bid ordered needs with blood Echo ordered for EF and degree of AS Not a candidate for cath given symptomatic anemia and falling Hct  [name]

## 2017-03-05 NOTE — Progress Notes (Addendum)
PROGRESS NOTE    [name]  XTK:[id number] DOB: Jan 26, 1930 DOA: 03/01/2017 PCP: Jinny Sanders, MD     Brief Narrative:  [name] is a 83 y.o. female with medical history significant for diabetes, hypertension, hyperlipidemia, anxiety, GERD, hypothyroidism, presenting after sustaining a mechanical fall onto her right hip. She also had right wrist pain and some swelling.  Patient reports trying to go to the bathroom, when she lost balance, with no loss of consciousness, or hitting her head.  She denies any dizziness, presyncope or syncope.  The patient denies any history of seizures.  She denies any chest pain, palpitations, or shortness of breath or cough.  She denies any abdominal pain, nausea or vomiting.  Denies any focal weakness, numbness or tingling.  The patient was immobilized, and a splint was placed on her right wrist as well.  Orthopedic surgery was consulted and patient underwent IM intertrochanteric fixation right hip, ORIF of right wrist 11/21 with Dr. Griffin Basil.   Assessment & Plan:   Active Problems:   Secondary DM with neurological manifestations ([place])   Diabetic peripheral neuropathy associated with type 2 diabetes mellitus ([place])   Hyperlipidemia   GERD   Chronic constipation   Falls frequently   Closed right hip fracture (H[facility])   Right hip and right wrist fracture after mechanical fall -S/p IM intertrochanteric fixation of right hip, ORIF of right wrist 11/21 with Dr. Griffin Basil. Follow up in 10-14 days  -PT OT recommending SNF  -Lovenox -- hold due to anemia   Dyspnea, NSTEMI  -EKG showed anterolateral ST depression. Discussed with cardiology for consult. Known moderate AS and post op tachycardia and anemia may cause subendothelial ischemia. Coreg added.  -CXR small left pleural effusion, atelectasis, cardiomegaly  -BNP 3581  -Trop 7.46, trend. No heparin due to anemia. Not candidate for cath  -Echo ordered  -Lasix ordered   Symptomatic anemia -Iron, B12  within normal limits, ferritin mildly elevated. Folate ordered -DIC labs, inconsistent with DIC  -LDH elevated 401  -No schistocytes on smear  -Transfuse 2u pRBC today  -Check FOBT, ?right thigh   Leukocytosis -?reactive to post-op stress and pain, acute illnesses as above. No sign of infectious etiology. UA negative. Blood cultures negative. Afebrile. No bands on differential  -Trend lactic acid  Hyponatremia -Serum osmol 305 -Urine studies ordered   Hyperkalemia -Hold lisinopril  -Given lasix over the past 2 days -KUB without obstruction or ileus, ordered kayexalate today  -Lasix IV  -Trend BMP   AKI on CKD stage 3 -Baseline Cr 1 -Hold IVF. Avoid nephrotoxin  -Renal US   DM type 2 -Ha1c 6.6  -Lantus, SSI   Hypertension  -Hold lisinopril for marginal BP. Coreg added   Abnormal TSH  -TSH low 0.294, Free T4 normal 1.10.  -Repeat TSH/T4 as outpatient once acute illnesses resolve   Constipation -Bowel regimen    DVT prophylaxis: lovenox  Code Status: full Family Communication: spoke with daughter at bedside, with granddaughter over the phone who is a Psychologist, sport and exercise in [place] Disposition Plan: SNF when stable. Transfer to stepdown today    Consultants:   Orthopedic surgery  Cardiology   Procedures:   Intramedullary nail intertrochanteric on right 11/21   Open reduction internal fixation write on right 11/21  Antimicrobials:  Anti-infectives (From admission, onward)   Start     Dose/Rate Route Frequency Ordered Stop   03/01/17 0815  ceFAZolin (ANCEF) IVPB 2g/100 mL premix     2 g 200 mL/hr over 30  Minutes Intravenous On call to O.R. 03/01/17 0757 03/01/17 0845       Subjective: Pain in her right wrist and right hip improved. Main complaint is constipation. She received suppository this morning. Also with some shortness of breath, but unable to tell me when it started. Denies chest pain.    Objective: Vitals:   03/04/17 0526 03/04/17 1300 03/04/17 2100  03/05/17 0452  BP: 96/61 (!) 96/28 (!) 113/58 (!) 112/58  Pulse: (!) 106 (!) 124 (!) 116 (!) 111  Resp: 16 20 (!) 22 20  Temp: 99 F (37.2 C) 98.4 F (36.9 C) 97.7 F (36.5 C) 97.7 F (36.5 C)  TempSrc: Oral Oral  Oral  SpO2: 95% 93% 94% 97%  Weight:    75.6 kg (166 lb 10.7 oz)  Height:        Intake/Output Summary (Last 24 hours) at 03/05/2017 1328 Last data filed at 03/05/2017 0932 Gross per 24 hour  Intake 240 ml  Output 1275 ml  Net -1035 ml   Filed Weights   03/01/17 0354 03/05/17 0452  Weight: 67.1 kg (148 lb) 75.6 kg (166 lb 10.7 oz)    Examination:  General exam: Appears uncomfortable, her back and bed sheet is damp   Respiratory system: Clear to auscultation. Decreased inspiratory effort. Tachypneic. On [place] O2  Cardiovascular system: S1 & S2 heard, tachycardic, regular rhythm. +murmur  Gastrointestinal system: Abdomen is nondistended, soft and nontender. No organomegaly or masses felt. Normal bowel sounds heard. Central nervous system: Alert and oriented. No focal neurological deficits. Extremities: +right wrist in dressing/wrap, +right hip dressing clean, +right thigh edema  Skin: No rashes, lesions or ulcers Psychiatry: Judgement and insight appear normal. Mood & affect appropriate.   Data Reviewed: I have personally reviewed following labs and imaging studies  CBC: Recent Labs  Lab 03/01/17 0359 03/02/17 0347 03/02/17 1418 03/03/17 0709 03/04/17 0805 03/04/17 0953 03/05/17 0425 03/05/17 0808  WBC 19.5* 13.8*  --  15.8* 23.3*  --  23.5*  --   NEUTROABS 15.2*  --   --   --   --  15.5*  --   --   HGB 12.5 9.7* 8.8* 8.6* 8.0*  --  7.5*  --   HCT 38.5 30.3* 27.5* 27.0* 24.8*  --  23.1*  --   MCV 81.6 81.5  --  81.8 80.5  --  80.2  --   PLT 327 323  --  268 313  --  340 355   Basic Metabolic Panel: Recent Labs  Lab 03/02/17 1418 03/03/17 0709 03/03/17 1355 03/04/17 0620 03/04/17 0805 03/04/17 0953 03/05/17 0425  NA 132* 131* 129* 129*  --   --   127*  K 4.6 5.6* 4.8 5.3*  --  5.4* 5.6*  CL 103 102 102 101  --   --  100*  CO2 21* 22 20* 19*  --   --  18*  GLUCOSE 220* 319* 268* 211*  --   --  273*  BUN 21* 27* 27* 38*  --   --  58*  CREATININE 0.94 1.14* 1.07* 1.14*  --   --  1.32*  CALCIUM 7.7* 8.0* 7.7* 8.0*  --   --  7.7*  MG  --   --   --   --  2.1  --   --   PHOS  --   --   --   --  2.7  --   --    GFR: Estimated Creatinine Clearance: 30.7  mL/min (A) (by C-G formula based on SCr of 1.32 mg/dL (H)). Liver Function Tests: Recent Labs  Lab 03/02/17 0347  AST 21  ALT 12*  ALKPHOS 68  BILITOT 0.8  PROT 5.6*  ALBUMIN 2.6*   No results for input(s): LIPASE, AMYLASE in the last 168 hours. No results for input(s): AMMONIA in the last 168 hours. Coagulation Profile: Recent Labs  Lab 03/01/17 0359 03/01/17 1555 03/02/17 0347 03/05/17 0808  INR 0.94 1.00 1.07 1.18   Cardiac Enzymes: Recent Labs  Lab 03/04/17 1230  CKTOTAL 839*   BNP (last 3 results) No results for input(s): PROBNP in the last 8760 hours. HbA1C: No results for input(s): HGBA1C in the last 72 hours. CBG: Recent Labs  Lab 03/04/17 1125 03/04/17 1614 03/04/17 2102 03/05/17 0607 03/05/17 1123  GLUCAP 361* 306* 276* 258* 365*   Lipid Profile: No results for input(s): CHOL, HDL, LDLCALC, TRIG, CHOLHDL, LDLDIRECT in the last 72 hours. Thyroid Function Tests: No results for input(s): TSH, T4TOTAL, FREET4, T3FREE, THYROIDAB in the last 72 hours. Anemia Panel: Recent Labs    03/05/17 0808  VITAMINB12 637  FERRITIN 316*  TIBC 272  IRON 35   Sepsis Labs: Recent Labs  Lab 03/05/17 1200  LATICACIDVEN 4.1*    Recent Results (from the past 240 hour(s))  Culture, blood (Routine X 2) w Reflex to ID Panel     Status: None (Preliminary result)   Collection Time: 03/01/17  3:55 PM  Result Value Ref Range Status   Specimen Description BLOOD BLOOD LEFT WRIST  Final   Special Requests   Final    IN PEDIATRIC BOTTLE Blood Culture results may  not be optimal due to an excessive volume of blood received in culture bottles   Culture NO GROWTH 3 DAYS  Final   Report Status PENDING  Incomplete  Culture, blood (Routine X 2) w Reflex to ID Panel     Status: None (Preliminary result)   Collection Time: 03/01/17  4:03 PM  Result Value Ref Range Status   Specimen Description BLOOD RIGHT HAND  Final   Special Requests IN PEDIATRIC BOTTLE Blood Culture adequate volume  Final   Culture NO GROWTH 3 DAYS  Final   Report Status PENDING  Incomplete       Radiology Studies: Dg Abd 1 View  Result Date: 03/05/2017 CLINICAL DATA:  Constipation. EXAM: ABDOMEN - 1 VIEW COMPARISON:  None. FINDINGS: The bowel gas pattern is normal. Moderate amount of stool seen throughout the colon and rectum. Atherosclerosis of abdominal aorta is noted. No nephrolithiasis is noted. IMPRESSION: Moderate stool burden is noted. No evidence of bowel obstruction or ileus. Electronically Signed   By: Marijo Conception, M.D.   On: 03/05/2017 09:41      Scheduled Meds: . acetaminophen  1,000 mg Oral Q8H  . enoxaparin (LOVENOX) injection  40 mg Subcutaneous Q24H  . feeding supplement (GLUCERNA SHAKE)  237 mL Oral BID BM  . insulin aspart  0-15 Units Subcutaneous TID WC  . insulin aspart  0-5 Units Subcutaneous QHS  . insulin glargine  20 Units Subcutaneous QHS  . pantoprazole  40 mg Oral Daily  . sodium polystyrene  30 g Oral Once   Continuous Infusions: . sodium chloride 100 mL/hr at 03/05/17 0016     LOS: 4 days    Time spent: 60 minutes   Dessa Phi, DO [facility] [email or web address] Password [email or web address] 03/05/2017, 1:28 PM

## 2017-03-06 ENCOUNTER — Inpatient Hospital Stay (HOSPITAL_COMMUNITY): Payer: PPO

## 2017-03-06 ENCOUNTER — Encounter (HOSPITAL_COMMUNITY): Payer: Self-pay | Admitting: Physician Assistant

## 2017-03-06 DIAGNOSIS — R195 Other fecal abnormalities: Secondary | ICD-10-CM

## 2017-03-06 DIAGNOSIS — L8990 Pressure ulcer of unspecified site, unspecified stage: Secondary | ICD-10-CM

## 2017-03-06 DIAGNOSIS — I361 Nonrheumatic tricuspid (valve) insufficiency: Secondary | ICD-10-CM

## 2017-03-06 DIAGNOSIS — D649 Anemia, unspecified: Secondary | ICD-10-CM

## 2017-03-06 DIAGNOSIS — I214 Non-ST elevation (NSTEMI) myocardial infarction: Secondary | ICD-10-CM

## 2017-03-06 DIAGNOSIS — S72001A Fracture of unspecified part of neck of right femur, initial encounter for closed fracture: Secondary | ICD-10-CM

## 2017-03-06 DIAGNOSIS — Z86010 Personal history of colonic polyps: Secondary | ICD-10-CM

## 2017-03-06 DIAGNOSIS — I350 Nonrheumatic aortic (valve) stenosis: Secondary | ICD-10-CM

## 2017-03-06 DIAGNOSIS — D72829 Elevated white blood cell count, unspecified: Secondary | ICD-10-CM

## 2017-03-06 LAB — CBC
HEMATOCRIT: 29.4 % — AB (ref 36.0–46.0)
Hemoglobin: 9.7 g/dL — ABNORMAL LOW (ref 12.0–15.0)
MCH: 26.4 pg (ref 26.0–34.0)
MCHC: 33 g/dL (ref 30.0–36.0)
MCV: 80.1 fL (ref 78.0–100.0)
PLATELETS: 316 10*3/uL (ref 150–400)
RBC: 3.67 MIL/uL — ABNORMAL LOW (ref 3.87–5.11)
RDW: 15.7 % — AB (ref 11.5–15.5)
WBC: 26.4 10*3/uL — ABNORMAL HIGH (ref 4.0–10.5)

## 2017-03-06 LAB — BASIC METABOLIC PANEL
Anion gap: 11 (ref 5–15)
BUN: 55 mg/dL — AB (ref 6–20)
CALCIUM: 7.5 mg/dL — AB (ref 8.9–10.3)
CO2: 17 mmol/L — AB (ref 22–32)
Chloride: 102 mmol/L (ref 101–111)
Creatinine, Ser: 1.13 mg/dL — ABNORMAL HIGH (ref 0.44–1.00)
GFR calc Af Amer: 50 mL/min — ABNORMAL LOW (ref >60)
GFR, EST NON AFRICAN AMERICAN: 43 mL/min — AB (ref >60)
GLUCOSE: 190 mg/dL — AB (ref 65–99)
Potassium: 4.3 mmol/L (ref 3.5–5.1)
Sodium: 130 mmol/L — ABNORMAL LOW (ref 135–145)

## 2017-03-06 LAB — BPAM RBC
Blood Product Expiration Date: 201812052359
Blood Product Expiration Date: 201812052359
ISSUE DATE / TIME: 201811251828
ISSUE DATE / TIME: 201811252127
Unit Type and Rh: 9500
Unit Type and Rh: 9500

## 2017-03-06 LAB — CREATININE, URINE, RANDOM: Creatinine, Urine: 69.34 mg/dL

## 2017-03-06 LAB — NA AND K (SODIUM & POTASSIUM), RAND UR
POTASSIUM UR: 23 mmol/L
Sodium, Ur: <10 mmol/L

## 2017-03-06 LAB — TROPONIN I
Troponin I: 9.43 ng/mL (ref <0.03)
Troponin I: 8.9 ng/mL (ref <0.03)
Troponin I: 9 ng/mL (ref <0.03)

## 2017-03-06 LAB — TYPE AND SCREEN
ABO/RH(D): O NEG
ANTIBODY SCREEN: NEGATIVE
Unit division: 0
Unit division: 0

## 2017-03-06 LAB — GLUCOSE, CAPILLARY
GLUCOSE-CAPILLARY: 255 mg/dL — AB (ref 65–99)
GLUCOSE-CAPILLARY: 212 mg/dL — AB (ref 65–99)
GLUCOSE-CAPILLARY: 124 mg/dL — AB (ref 65–99)
Glucose-Capillary: 242 mg/dL — ABNORMAL HIGH (ref 65–99)

## 2017-03-06 LAB — CULTURE, BLOOD (ROUTINE X 2)
CULTURE: NO GROWTH
Culture: NO GROWTH
Special Requests: ADEQUATE

## 2017-03-06 LAB — OSMOLALITY, URINE: Osmolality, Ur: 525 mOsm/kg (ref 300–900)

## 2017-03-06 LAB — ECHOCARDIOGRAM COMPLETE
HEIGHTINCHES: 63.5 in
WEIGHTICAEL: 2627.88 [oz_av]

## 2017-03-06 LAB — LACTIC ACID, PLASMA
Lactic Acid, Venous: 1.4 mmol/L (ref 0.5–1.9)
Lactic Acid, Venous: 1.5 mmol/L (ref 0.5–1.9)

## 2017-03-06 MED ORDER — INSULIN ASPART 100 UNIT/ML ~~LOC~~ SOLN
5.0000 [IU] | Freq: Three times a day (TID) | SUBCUTANEOUS | Status: DC
  Administered 2017-03-06 – 2017-03-15 (×23): 5 [IU] via SUBCUTANEOUS

## 2017-03-06 MED ORDER — INSULIN GLARGINE 100 UNIT/ML ~~LOC~~ SOLN
22.0000 [IU] | Freq: Every day | SUBCUTANEOUS | Status: DC
  Administered 2017-03-06 – 2017-03-07 (×2): 22 [IU] via SUBCUTANEOUS
  Filled 2017-03-06 (×3): qty 0.22

## 2017-03-06 NOTE — Consult Note (Signed)
[facility] Consult: 2:06 PM 03/06/2017  LOS: 5 days    Referring Provider: Dr Dessa Phi  Primary Care Physician:  Jinny Sanders, MD Primary Gastroenterologist:  Dr. Scarlette Shorts    Reason for Consultation:  FOBT + anemia.     HPI: [name] is a 83 y.o. female.  Hx type II IDDM.  Hypothyroidism.  Osteoporosis.  Htn.  CKD, stage 3.  Fx right humerus 09/2015.   S/P remote Nissen fundoplication 8502.  HH noted on CT angio chest in 2014.   06/2007 colonoscopy for history of multiple polyps largest of which was 19 mm in 2007.  Encountered severe pandiverticulosis.  7 polyps removed, all less than 5 mm (Tubular adenomas and hyperplastic).  Internal hemorrhoids. 12/2010 EGD with balloon dilation of 7 mm distal esophageal stricture.  Scope was unable to pass the stricture but the balloon dilator passed the stricture.  Dr. Henrene Pastor noted partially slipped wrap of the fundoplication. 01/2011 EGD with balloon dilatation of esophageal stricture to 13.5 mm 02/2011 EGD with balloon dilatation of the 10 mm esophageal stricture. 03/2011 EGD with balloon dilation of high-grade esophageal stricture to 13.5 mm.  Sustained right wrist and hip fractures after falling at home.  Story is she reached forward to lift the lid of the commode, got dizzy and fell.  S/P 03/01/17 ORIF right wrist fracture and nail fixation of right intertrochanteric fracture.   Multiple complications have occurred since admission.  Include non-STEMI.  AKI.  Hyponatremia.   Anemia with Hgb 12.5 .. 9.7.. 8.6 .. 8.0.. 7.5.. (2 U PRBCs) 9.7.  MCV normal at 80. Required transfusion 11/25.  Stool is FOBT positive. Rising white blood cell count, 26.4 today. PT/INR and platelets normal.   Lovenox discontinued, last dose on 11/25 at 0900  History obtained from  the patient's daughter who is well informed.  Patient has not had much in the way of dysphagia, it only occurs occasionally.  However, she is careful to eat small bites of food and chew well.  Her appetite is good.  She has not had any vomiting at home.  She takes omeprazole for acid control.  NSAIDs.  She has been constipated in the hospital but after receiving rectal Dulcolax oral laxative and Kayexalate  (for hyperkalemia) yesterday she had a large, loose, brown stool and another this afternoon. Surprisingly she has not received any narcotic analgesics since 11/24 and current pain control is with acetaminophen.   Past Medical History:  Diagnosis Date  . Abdominal pain, epigastric   . Anxiety   . Corns and callosities   . Diabetes mellitus   . Diaphragmatic hernia without mention of obstruction or gangrene   . Diverticulosis of colon (without mention of hemorrhage) 2009  . Falls frequently   . Fibromyalgia   . GERD (gastroesophageal reflux disease)   . Hiatal hernia   . History of colon polyps 2009   Hyperplastic  . Hx of adenomatous colonic polyps 2009  . Hyperlipidemia   . IBS (irritable bowel syndrome)   . Internal hemorrhoids without mention  of complication   . Rash and other nonspecific skin eruption   . Status post dilation of esophageal narrowing   . Thyroid disease   . Type II or unspecified type diabetes mellitus with neurological manifestations, not stated as uncontrolled(250.60)     Past Surgical History:  Procedure Laterality Date  . ABDOMINAL HYSTERECTOMY    . BASAL CELL CARCINOMA EXCISION  04/2016   corner of left eye  . bmd  2007  . CATARACT EXTRACTION    . ESOPHAGOGASTRODUODENOSCOPY  2007  . HERNIA REPAIR    . INTRAMEDULLARY (IM) NAIL INTERTROCHANTERIC Right 03/01/2017   Procedure: INTRAMEDULLARY (IM) NAIL INTERTROCHANTRIC;  Surgeon: Hiram Gash, MD;  Location: [facility];  Service: Orthopedics;  Laterality: Right;  . LAPAROSCOPIC NISSEN FUNDOPLICATION  9233  .  ORIF WRIST FRACTURE Right 03/01/2017   Procedure: OPEN REDUCTION INTERNAL FIXATION (ORIF) WRIST FRACTURE;  Surgeon: Hiram Gash, MD;  Location: [facility];  Service: Orthopedics;  Laterality: Right;  . THYROIDECTOMY, PARTIAL      Prior to Admission medications   Medication Sig Start Date End Date Taking? Authorizing Provider  aspirin 81 MG tablet Take 81 mg by mouth daily.     Yes [provider]  calcium carbonate (OS-CAL) 1250 (500 Ca) MG chewable tablet Chew 1 tablet by mouth daily.   Yes [provider]  insulin aspart (NOVOLOG FLEXPEN) 100 UNIT/ML FlexPen Inject 10 Units into the skin 3 (three) times daily with meals. 09/26/16  Yes Bedsole, Amy E, MD  insulin NPH Human (HUMULIN N,NOVOLIN N) 100 UNIT/ML injection Inject 15 Units into the skin at bedtime.    Yes [provider]  lisinopril (PRINIVIL,ZESTRIL) 5 MG tablet TAKE ONE TABLET BY MOUTH ONCE DAILY 09/13/16  Yes Bedsole, Amy E, MD  omeprazole (PRILOSEC) 20 MG capsule Take 20 mg by mouth 2 (two) times daily before a meal.   Yes [provider]  acetaminophen (TYLENOL) 500 MG tablet Take 2 tabs po q8 hours x 14 days following surgery 03/01/17   Aundra Dubin, PA-C  alendronate (FOSAMAX) 70 MG tablet Take 1 tablet (70 mg total) by mouth every 7 (seven) days. Take with a full glass of water on an empty stomach. Patient not taking: Reported on 03/01/2017 07/22/16   Jinny Sanders, MD  enoxaparin (LOVENOX) 40 MG/0.4ML injection Inject 0.4 mLs (40 mg total) into the skin daily. 03/01/17   Aundra Dubin, PA-C  ondansetron (ZOFRAN) 4 MG tablet Take 1 tablet (4 mg total) by mouth every 8 (eight) hours as needed for nausea or vomiting. 03/01/17   Aundra Dubin, PA-C  oxyCODONE (ROXICODONE) 5 MG immediate release tablet Take 1-2 tabs po q6-8 hours prn pain.  Do not exceed 6 tabs per day 03/01/17   Aundra Dubin, PA-C  pantoprazole (PROTONIX) 40 MG tablet Take 1 tablet (40 mg total) by mouth daily. Patient  not taking: Reported on 03/01/2017 07/22/16   Jinny Sanders, MD    Scheduled Meds: . acetaminophen  1,000 mg Oral Q8H  . carvedilol  3.125 mg Oral BID WC  . feeding supplement (GLUCERNA SHAKE)  237 mL Oral BID BM  . furosemide  40 mg Intravenous BID  . insulin aspart  0-15 Units Subcutaneous TID WC  . insulin aspart  0-5 Units Subcutaneous QHS  . insulin aspart  5 Units Subcutaneous TID WC  . insulin glargine  22 Units Subcutaneous QHS  . mouth rinse  15 mL Mouth Rinse BID  . pantoprazole  40 mg Oral Daily  . polyethylene glycol  17 g Oral Daily   Infusions:  PRN Meds: bisacodyl, ondansetron, oxyCODONE, senna-docusate   Allergies as of 03/01/2017 - Review Complete 03/01/2017  Allergen Reaction Noted  . Codeine    . Metformin    . Penicillins    . Pioglitazone  10/08/2007  . Fenofibrate Palpitations 05/08/2013    Family History  Problem Relation Age of Onset  . Uterine cancer Mother   . Cancer Mother   . Diabetes Maternal Grandfather   . Diabetes Son   . Hyperlipidemia Son   . Heart disease Paternal Grandmother   . Breast cancer Paternal Aunt   . Colon cancer Neg Hx   . Stomach cancer Neg Hx     Social History   Socioeconomic History  . Marital status: Widowed    Spouse name: Not on file  . Number of children: 3  . Years of education: Not on file  . Highest education level: Not on file  Social Needs  . Financial resource strain: Not on file  . Food insecurity - worry: Not on file  . Food insecurity - inability: Not on file  . Transportation needs - medical: Not on file  . Transportation needs - non-medical: Not on file  Occupational History  . Occupation: retired    Fish farm manager: RETIRED  Tobacco Use  . Smoking status: Former Smoker    Packs/day: 0.25    Years: 4.00    Pack years: 1.00    Types: Cigarettes    Last attempt to quit: 03/10/1981    Years since quitting: 36.0  . Smokeless tobacco: Never Used  Substance and Sexual Activity  . Alcohol use:  No    Alcohol/week: 0.0 oz  . Drug use: No  . Sexual activity: No  Other Topics Concern  . Not on file  Social History Narrative   Widowed   3 children          REVIEW OF SYSTEMS: Constitutional:  + weakness.   ENT:  No nose bleeds Pulm:  No SOB or cough CV:  No palpitations, no LE edema.  GU:  No hematuria, no frequency GI:  Per HPI Heme: No patterns of excessive or unusual bleeding. Transfusions: Besides the 2 units PRBCs she is received yesterday, she is never required transfusion before has never required iron, B12, folate supplements. Neuro:  No syncope.  No headaches, no peripheral tingling or numbness Derm:  No itching, no rash or sores.  Endocrine:  No sweats or chills.  No polyuria or dysuria Immunization: Reviewed.  I see that she has had pneumo vaccination but I do not see any records of flu vaccination Travel:  None beyond local counties in last few months.    PHYSICAL EXAM: Vital signs in last 24 hours: Vitals:   03/06/17 0723 03/06/17 1119  BP: 126/74 114/70  Pulse: (!) 110 (!) 102  Resp:  (!) 29  Temp: (!) 97.5 F (36.4 C) 97.6 F (36.4 C)  SpO2: 94% 95%   Wt Readings from Last 3 Encounters:  03/06/17 74.5 kg (164 lb 3.9 oz)  07/22/16 67.8 kg (149 lb 8 oz)  07/12/16 67.1 kg (148 lb)    General: pleasant, looks unwell and pale Head:  No asymmetry or swelling.  No signs of head trauma  Eyes:  No scleral icterus or pallor.   Ears:  Not HOH  Nose:  No discharge or congestion Mouth:  Dry oral MM, tongue midline.  Upper partial  dentures, no molars on lower jaw.   Neck:  No mass, no JVD.   Lungs:  Clear bil.  Slight tachypnea and labored breathing.   Heart: RRR.  2/6 SM.   Abdomen:  Soft, NT.  ND.  Active BS.  No mass, bruising, hernia, bruits.   Rectal: liquid, brown, FOBT negative stool.   No DRE, sample obtained from bed pad.  No visible hemorrhoids.   Musc/Skeltl: bandage covering right hip but no surrounding bruising.  Ace wrap on right  forearm/wrist: no surrounding bruising Extremities:  2 + pedal edema.  Feet wrapped in pressure-relieving down booties.    Neurologic:  Oriented x 3.  Appropriate.   Skin:  No telangectasia.  No significant purpura or bruising Nodes:  No cervical adenopathy.     Psych: pleasant.  Affect flat c/w illness.    Intake/Output from previous day: 11/25 0701 - 11/26 0700 In: 1096 [P.O.:240; I.V.:200; Blood:646] Out: 1675 [KVQQV:9563] Intake/Output this shift: Total I/O In: -  Out: 2050 [Urine:2050]  LAB RESULTS: Recent Labs    03/04/17 0805 03/05/17 0425 03/05/17 0808 03/06/17 0157  WBC 23.3* 23.5*  --  26.4*  HGB 8.0* 7.5*  --  9.7*  HCT 24.8* 23.1*  --  29.4*  PLT 313 340 175 316   BMET Lab Results  Component Value Date   NA 130 (L) 03/06/2017   NA 127 (L) 03/05/2017   NA 129 (L) 03/04/2017   K 4.3 03/06/2017   K 5.6 (H) 03/05/2017   K 5.4 (H) 03/04/2017   CL 102 03/06/2017   CL 100 (L) 03/05/2017   CL 101 03/04/2017   CO2 17 (L) 03/06/2017   CO2 18 (L) 03/05/2017   CO2 19 (L) 03/04/2017   GLUCOSE 190 (H) 03/06/2017   GLUCOSE 273 (H) 03/05/2017   GLUCOSE 211 (H) 03/04/2017   BUN 55 (H) 03/06/2017   BUN 58 (H) 03/05/2017   BUN 38 (H) 03/04/2017   CREATININE 1.13 (H) 03/06/2017   CREATININE 1.32 (H) 03/05/2017   CREATININE 1.14 (H) 03/04/2017   CALCIUM 7.5 (L) 03/06/2017   CALCIUM 7.7 (L) 03/05/2017   CALCIUM 8.0 (L) 03/04/2017   LFT No results for input(s): PROT, ALBUMIN, AST, ALT, ALKPHOS, BILITOT, BILIDIR, IBILI in the last 72 hours. PT/INR Lab Results  Component Value Date   INR 1.18 03/05/2017   INR 1.07 03/02/2017   INR 1.00 03/01/2017   Lipase     Component Value Date/Time   LIPASE 41 05/31/2007 1905    Drugs of Abuse  No results found for: LABOPIA, COCAINSCRNUR, LABBENZ, AMPHETMU, THCU, LABBARB   RADIOLOGY STUDIES: Dg Abd 1 View  Result Date: 03/05/2017 CLINICAL DATA:  Constipation. EXAM: ABDOMEN - 1 VIEW COMPARISON:  None. FINDINGS: The  bowel gas pattern is normal. Moderate amount of stool seen throughout the colon and rectum. Atherosclerosis of abdominal aorta is noted. No nephrolithiasis is noted. IMPRESSION: Moderate stool burden is noted. No evidence of bowel obstruction or ileus. Electronically Signed   By: Marijo Conception, M.D.   On: 03/05/2017 09:41   US Renal  Result Date: 03/06/2017 CLINICAL DATA:  Acute kidney injury and history of diabetes. EXAM: RENAL / URINARY TRACT ULTRASOUND COMPLETE COMPARISON:  None. FINDINGS: Right Kidney: Length: 11.8 cm. Mild cortical thinning with normal echogenicity. No hydronephrosis. Simple cyst emanating from the lower pole measures 1.6 cm. No solid masses identified. Left Kidney: Length: 11.5 cm. Mild cortical thinning with normal echogenicity. No hydronephrosis. No solid masses identified. Bladder: The  bladder is decompressed by a Foley catheter. IMPRESSION: Mild cortical atrophy of both kidneys. No evidence of renal obstruction. Electronically Signed   By: Aletta Edouard M.D.   On: 03/06/2017 07:59   Dg Chest Port 1 View  Result Date: 03/05/2017 CLINICAL DATA:  Patient with shortness of breath. EXAM: PORTABLE CHEST 1 VIEW COMPARISON:  Chest radiograph 03/01/2017 FINDINGS: Monitoring leads overlie the patient. Patient is rotated to the left. Stable enlarged cardiac and mediastinal contours. Aortic atherosclerosis. Small left pleural effusion. Left basilar heterogeneous opacities. No pneumothorax. IMPRESSION: Small left pleural effusion with underlying opacities favored to represent atelectasis. Cardiomegaly. Electronically Signed   By: Lovey Newcomer M.D.   On: 03/05/2017 14:09    IMPRESSION:   *   Anemia, FOBT + x 1.  No overt hemorrhaging.  *   Constipation, resolved.  *   History of adenomatous colon polyps in 2007 01/2008.  Has aged out of need for repeat colonoscopy.  *   S/P Nissen fundoplication 4709.  Developed distal esophageal stricture requiring balloon dilatation on 4  occasions in 2012.  Per EGD fundoplication partially unwrapped.  Per 2014 chest CT: has recurrent hiatal hernia.  The patient manages dysphagia effectively with chewing well and eating soft or small cut foods.  Takes omeprazole daily at home, currently on oral Protonix in the hospital.  *   S/p 11/21 right wrist fx repair and nail fixation of right trochanter fracture.    *   Non-STEMI.  Dr. Acie Fredrickson states "not a candidate for cath or PCI" in his note today.  Echocardiogram in progress.  *   CKD 3.  Now with AKI.  Hyperkalemia corrected with Kayexalate yesterday.    *   Leukocytosis.  No signs of urinary tract infection or pneumonia.   PLAN:     *    Supportive care to include continuing the daily oral Protonix, daily MiraLAX.  Agree with plans for CT abdomen pelvis to rule out retroperitoneal hematoma or other intra-abdominal bleeding as well as plans for CT of right femur to rule out hematoma/bleed. Patient has too many medical issues for what would be non-urgent EGD or colonoscopy.     Azucena Freed  03/06/2017, 2:06 PM Pager: [phone]     Attending physician's note   I have taken a history, examined the patient and reviewed the chart. I agree with the Advanced Practitioner's note, impression and recommendations.  * Worsening anemia with occult blood in stool however no overt GI bleeding. R/O hematoma related to right hip fracture and surgical repair as cause of worsening anemia. * Constipation, resolved.  * Non-STEMI * CKD 3  Schedule abdomen/pelvis/right hip CT.  Consider elective, outpatient colonoscopy +/- EGD in 2-3 months to allow recovery from surgery and MI for occult blood in stool.  Trend CBC.   Lucio Edward, MD [facility] [phone] Mon-Fri 8a-5p [phone] after 5p, weekends, holidays

## 2017-03-06 NOTE — Progress Notes (Signed)
PT Cancellation Note  Patient Details Name: [name] MRN: [id number] DOB: 19-Jun-1929   Cancelled Treatment:    Reason Eval/Treat Not Completed: (P) Medical issues which prohibited therapy(Pt presents with elevated troponins trending up. Will defer mobility at this time per PT protocol and await downward trend in troponin levels.  )   Tobby Fawcett Eli Hose 03/06/2017, 10:23 AM  Governor Rooks, PTA pager [phone]

## 2017-03-06 NOTE — Consult Note (Signed)
[facility] Nurse wound consult note Reason for Consult:deep tissue injury to sacrum and right heel Wound type: deep tissue injury Pressure Injury POA: No Measurement: Sacrum 2 cm x 2 cm maroon intact discoloration Right heel:  2 cm x 1 cm intact maroon discoloration Wound HCS:[id number] Drainage (amount, consistency, odor) none Periwound:intact Dressing procedure/placement/frequency:Turn and reposition every two hours.  Offload pressure Will not follow at this time.  Please re-consult if needed.  Domenic Moras RN BSN [place] Pager [phone]

## 2017-03-06 NOTE — Progress Notes (Signed)
Inpatient Diabetes Program Recommendations  AACE/ADA: New Consensus Statement on Inpatient Glycemic Control (2015)  Target Ranges:  Prepandial:   less than 140 mg/dL      Peak postprandial:   less than 180 mg/dL (1-2 hours)      Critically ill patients:  140 - 180 mg/dL  Results for [name], [name] (MRN [id number]) as of 03/06/2017 10:55  Ref. Range 03/05/2017 06:07 03/05/2017 11:23 03/05/2017 16:32 03/05/2017 21:08 03/06/2017 07:31  Glucose-Capillary Latest Ref Range: 65 - 99 mg/dL 258 (H) 365 (H) 249 (H) 184 (H) 242 (H)    Review of Glycemic Control  Diabetes history: DM2 Outpatient Diabetes medications: NPH 15 units QHS, Novolog 10 units TID with meals Current orders for Inpatient glycemic control: Lantus 20 units QHS, Novolog 0-15 units TID with meals, Novolog 0-5 units QHS  Inpatient Diabetes Program Recommendations: Insulin - Basal: Please consider increasing Lantus to 22 units QHS. Insulin - Meal Coverage: Please consider ordering Novolog 5 units TID with meals for meal coverage if patient eats at least 50% of meals.  Thanks, Barnie Alderman, RN, MSN, CDE Diabetes Coordinator Inpatient Diabetes Program [phone] (Team Pager from 8am to 5pm)

## 2017-03-06 NOTE — Progress Notes (Addendum)
PROGRESS NOTE    [name]  YSA:[id number] DOB: 03-29-1930 DOA: 03/01/2017 PCP: [name] Sanders, MD     Brief Narrative:  [name] is a 83 y.o. female with medical history significant for diabetes, hypertension, hyperlipidemia, anxiety, GERD, hypothyroidism, presenting after sustaining a mechanical fall onto her right hip. She also had right wrist pain and some swelling.  Patient reports trying to go to the bathroom, when she lost balance, with no loss of consciousness, or hitting her head.  She denies any dizziness, presyncope or syncope.  The patient denies any history of seizures.  She denies any chest pain, palpitations, or shortness of breath or cough.  She denies any abdominal pain, nausea or vomiting.  Denies any focal weakness, numbness or tingling.  The patient was immobilized, and a splint was placed on her right wrist as well.  Orthopedic surgery was consulted and patient underwent IM intertrochanteric fixation right hip, ORIF of right wrist 11/21 with Dr. Griffin Basil.  Hospitalization has been complicated by dyspnea, NSTEMI, anemia.  Assessment & Plan:   Principal Problem:   Closed right hip fracture ([place]) Active Problems:   Secondary DM with neurological manifestations ([place])   Diabetic peripheral neuropathy associated with type 2 diabetes mellitus (HCC)   Hyperlipidemia   GERD   Chronic constipation   Systolic murmur   Falls frequently   Pressure injury of skin   Right hip and right wrist fracture after mechanical fall -S/p IM intertrochanteric fixation of right hip, ORIF of right wrist 11/21 with Dr. Griffin Basil. Follow up in 10-14 days. PT OT recommending SNF  -Lovenox for DVT ppx on hold due to anemia. SCDs   NSTEMI  -EKG showed anterolateral ST depression. Cardiology consulted. Known moderate AS and post op tachycardia and anemia may cause subendothelial ischemia. Coreg added.  -CXR small left pleural effusion, atelectasis, cardiomegaly  -BNP 3581  -Trop 7.46 -->  9.43 --> 8.9. No heparin due to anemia. Not candidate for cath  -Echo pending  -Lasix 40mg  IV BID   Symptomatic blood loss anemia -Baseline Hgb/Hct 13.8/41.9 in August 2018  -Iron, B12 within normal limits, ferritin mildly elevated. Folate ordered -DIC labs, inconsistent with DIC  -LDH elevated 401  -No schistocytes on smear  -Transfused 2u pRBC 11/25  -FOBT positive. Consulted GI. Patient had EGD and colonoscopy in the past with [facility], Dr. Henrene Pastor -?right thigh. Appears stable in size. Will check CT abd/pelvis, CT femur and rule out retroperitoneal bleed, hematoma   Leukocytosis -?reactive to post-op stress and pain, acute illnesses as above. No sign of infectious etiology. UA negative. Blood cultures negative. Afebrile. No bands on differential    Hyponatremia -Improving, trend BMP   Hyperkalemia -Resolved today, trend BMP   AKI on CKD stage 3 -Baseline Cr 1 -Hold IVF. Avoid nephrotoxin  -Renal US negative for obstruction/hydronephrosis -Improved today   DM type 2 -Ha1c 6.6  -Lantus, SSI. Dose adjusted today per DM coordinator recommendation   Hypertension  -Hold lisinopril for marginal BP, AKI. Coreg added   Abnormal TSH  -TSH low 0.294, Free T4 normal 1.10.  -Repeat TSH/T4 as outpatient once acute illnesses resolve   Constipation -Resolved   Sacral and right heel stage 1, not POA  -Wound RN consulted -Turn every 2 hours, offload pressure    DVT prophylaxis: SCDs  Code Status: full Family Communication: spoke with son over the phone. No family at bedside today Disposition Plan: SNF when stable   Consultants:   Orthopedic surgery  Cardiology  GI   Procedures:   Intramedullary nail intertrochanteric on right 11/21   Open reduction internal fixation write on right 11/21  Antimicrobials:  Anti-infectives (From admission, onward)   Start     Dose/Rate Route Frequency Ordered Stop   03/01/17 0815  ceFAZolin (ANCEF) IVPB 2g/100 mL premix     2  g 200 mL/hr over 30 Minutes Intravenous On call to O.R. 03/01/17 0757 03/01/17 0845       Subjective: States that her pain in her right hip and right wrist have been improving.  Continues to be very weak.  Admits to shallow breathing but no chest pain today.  Her constipation has now resolved, had 5 bowel movements yesterday and one this morning.   Objective: Vitals:   03/06/17 0211 03/06/17 0419 03/06/17 0723 03/06/17 1119  BP:  108/73 126/74 114/70  Pulse:  (!) 104 (!) 110 (!) 102  Resp: (!) 25 (!) 26  (!) 29  Temp:   (!) 97.5 F (36.4 C) 97.6 F (36.4 C)  TempSrc:  Axillary Oral Oral  SpO2: 95% 96% 94% 95%  Weight:  74.5 kg (164 lb 3.9 oz)    Height:        Intake/Output Summary (Last 24 hours) at 03/06/2017 1323 Last data filed at 03/06/2017 1119 Gross per 24 hour  Intake 976 ml  Output 3725 ml  Net -2749 ml   Filed Weights   03/01/17 0354 03/05/17 0452 03/06/17 0419  Weight: 67.1 kg (148 lb) 75.6 kg (166 lb 10.7 oz) 74.5 kg (164 lb 3.9 oz)    Examination:  General exam: Appears uncomfortable, but calm  Respiratory system: Clear to auscultation. Decreased inspiratory effort. Tachypneic rate 20s. On [place] O2. Appears better on exam compared to yesterday prior to transfusion  Cardiovascular system: S1 & S2 heard, tachycardic, regular rhythm. +murmur  Gastrointestinal system: Abdomen is nondistended, soft and nontender. No organomegaly or masses felt. Normal bowel sounds heard. Central nervous system: Alert and oriented. No focal neurological deficits.  Extremities: +right wrist in dressing/wrap, +right hip dressing clean, +right thigh edema  Skin: +stage 1 right heel  Psychiatry: Judgement and insight appear normal. Mood & affect appropriate.   Data Reviewed: I have personally reviewed following labs and imaging studies  CBC: Recent Labs  Lab 03/01/17 0359 03/02/17 0347 03/02/17 1418 03/03/17 0709 03/04/17 0805 03/04/17 0953 03/05/17 0425 03/05/17 0808  03/06/17 0157  WBC 19.5* 13.8*  --  15.8* 23.3*  --  23.5*  --  26.4*  NEUTROABS 15.2*  --   --   --   --  15.5*  --   --   --   HGB 12.5 9.7* 8.8* 8.6* 8.0*  --  7.5*  --  9.7*  HCT 38.5 30.3* 27.5* 27.0* 24.8*  --  23.1*  --  29.4*  MCV 81.6 81.5  --  81.8 80.5  --  80.2  --  80.1  PLT 327 323  --  268 313  --  340 175 161   Basic Metabolic Panel: Recent Labs  Lab 03/03/17 0709 03/03/17 1355 03/04/17 0620 03/04/17 0805 03/04/17 0953 03/05/17 0425 03/06/17 0157  NA 131* 129* 129*  --   --  127* 130*  K 5.6* 4.8 5.3*  --  5.4* 5.6* 4.3  CL 102 102 101  --   --  100* 102  CO2 22 20* 19*  --   --  18* 17*  GLUCOSE 319* 268* 211*  --   --  273*  190*  BUN 27* 27* 38*  --   --  58* 55*  CREATININE 1.14* 1.07* 1.14*  --   --  1.32* 1.13*  CALCIUM 8.0* 7.7* 8.0*  --   --  7.7* 7.5*  MG  --   --   --  2.1  --   --   --   PHOS  --   --   --  2.7  --   --   --    GFR: Estimated Creatinine Clearance: 35.6 mL/min (A) (by C-G formula based on SCr of 1.13 mg/dL (H)). Liver Function Tests: Recent Labs  Lab 03/02/17 0347  AST 21  ALT 12*  ALKPHOS 68  BILITOT 0.8  PROT 5.6*  ALBUMIN 2.6*   No results for input(s): LIPASE, AMYLASE in the last 168 hours. No results for input(s): AMMONIA in the last 168 hours. Coagulation Profile: Recent Labs  Lab 03/01/17 0359 03/01/17 1555 03/02/17 0347 03/05/17 0808  INR 0.94 1.00 1.07 1.18   Cardiac Enzymes: Recent Labs  Lab 03/04/17 1230 03/05/17 1346 03/06/17 0157 03/06/17 0758  CKTOTAL 839*  --   --   --   TROPONINI  --  7.46* 9.43* 8.90*   BNP (last 3 results) No results for input(s): PROBNP in the last 8760 hours. HbA1C: No results for input(s): HGBA1C in the last 72 hours. CBG: Recent Labs  Lab 03/05/17 1123 03/05/17 1632 03/05/17 2108 03/06/17 0731 03/06/17 1215  GLUCAP 365* 249* 184* 242* 255*   Lipid Profile: No results for input(s): CHOL, HDL, LDLCALC, TRIG, CHOLHDL, LDLDIRECT in the last 72 hours. Thyroid  Function Tests: Recent Labs    03/05/17 1202 03/05/17 1346  TSH 0.294*  --   FREET4  --  1.10   Anemia Panel: Recent Labs    03/05/17 0808  VITAMINB12 637  FERRITIN 316*  TIBC 272  IRON 35   Sepsis Labs: Recent Labs  Lab 03/05/17 1200 03/06/17 0157 03/06/17 0758  LATICACIDVEN 4.1* 1.4 1.5    Recent Results (from the past 240 hour(s))  Culture, blood (Routine X 2) w Reflex to ID Panel     Status: None (Preliminary result)   Collection Time: 03/01/17  3:55 PM  Result Value Ref Range Status   Specimen Description BLOOD BLOOD LEFT WRIST  Final   Special Requests   Final    IN PEDIATRIC BOTTLE Blood Culture results may not be optimal due to an excessive volume of blood received in culture bottles   Culture NO GROWTH 4 DAYS  Final   Report Status PENDING  Incomplete  Culture, blood (Routine X 2) w Reflex to ID Panel     Status: None (Preliminary result)   Collection Time: 03/01/17  4:03 PM  Result Value Ref Range Status   Specimen Description BLOOD RIGHT HAND  Final   Special Requests IN PEDIATRIC BOTTLE Blood Culture adequate volume  Final   Culture NO GROWTH 4 DAYS  Final   Report Status PENDING  Incomplete  MRSA PCR Screening     Status: None   Collection Time: 03/05/17  6:37 PM  Result Value Ref Range Status   MRSA by PCR NEGATIVE NEGATIVE Final    Comment:        The GeneXpert MRSA Assay (FDA approved for NASAL specimens only), is one component of a comprehensive MRSA colonization surveillance program. It is not intended to diagnose MRSA infection nor to guide or monitor treatment for MRSA infections.  Radiology Studies: Dg Abd 1 View  Result Date: 03/05/2017 CLINICAL DATA:  Constipation. EXAM: ABDOMEN - 1 VIEW COMPARISON:  None. FINDINGS: The bowel gas pattern is normal. Moderate amount of stool seen throughout the colon and rectum. Atherosclerosis of abdominal aorta is noted. No nephrolithiasis is noted. IMPRESSION: Moderate stool burden is  noted. No evidence of bowel obstruction or ileus. Electronically Signed   By: Marijo Conception, M.D.   On: 03/05/2017 09:41   US Renal  Result Date: 03/06/2017 CLINICAL DATA:  Acute kidney injury and history of diabetes. EXAM: RENAL / URINARY TRACT ULTRASOUND COMPLETE COMPARISON:  None. FINDINGS: Right Kidney: Length: 11.8 cm. Mild cortical thinning with normal echogenicity. No hydronephrosis. Simple cyst emanating from the lower pole measures 1.6 cm. No solid masses identified. Left Kidney: Length: 11.5 cm. Mild cortical thinning with normal echogenicity. No hydronephrosis. No solid masses identified. Bladder: The bladder is decompressed by a Foley catheter. IMPRESSION: Mild cortical atrophy of both kidneys. No evidence of renal obstruction. Electronically Signed   By: Aletta Edouard M.D.   On: 03/06/2017 07:59   Dg Chest Port 1 View  Result Date: 03/05/2017 CLINICAL DATA:  Patient with shortness of breath. EXAM: PORTABLE CHEST 1 VIEW COMPARISON:  Chest radiograph 03/01/2017 FINDINGS: Monitoring leads overlie the patient. Patient is rotated to the left. Stable enlarged cardiac and mediastinal contours. Aortic atherosclerosis. Small left pleural effusion. Left basilar heterogeneous opacities. No pneumothorax. IMPRESSION: Small left pleural effusion with underlying opacities favored to represent atelectasis. Cardiomegaly. Electronically Signed   By: Lovey Newcomer M.D.   On: 03/05/2017 14:09      Scheduled Meds: . acetaminophen  1,000 mg Oral Q8H  . carvedilol  3.125 mg Oral BID WC  . feeding supplement (GLUCERNA SHAKE)  237 mL Oral BID BM  . furosemide  40 mg Intravenous BID  . insulin aspart  0-15 Units Subcutaneous TID WC  . insulin aspart  0-5 Units Subcutaneous QHS  . insulin aspart  5 Units Subcutaneous TID WC  . insulin glargine  22 Units Subcutaneous QHS  . mouth rinse  15 mL Mouth Rinse BID  . pantoprazole  40 mg Oral Daily  . polyethylene glycol  17 g Oral Daily   Continuous  Infusions:    LOS: 5 days    Time spent: 40 minutes   Dessa Phi, DO [facility] [email or web address] Password [email or web address] 03/06/2017, 1:23 PM

## 2017-03-06 NOTE — Progress Notes (Signed)
  Echocardiogram 2D Echocardiogram has been performed.  Mkayla Steele T Jermiyah Ricotta 03/06/2017, 3:14 PM

## 2017-03-06 NOTE — Social Work (Signed)
CSW met with patient, son & daughter at bedside to discuss SNF placement offers.  Family was provided with SNF placement packet with offers.   Family and placement would like placement at [facility].   CSW will continue to follow and support discharge when medically appropriate.   Alexander Mt, [place] Work [phone]

## 2017-03-06 NOTE — Progress Notes (Signed)
Progress Note  Patient Name: [name] Date of Encounter: 03/06/2017  Primary Cardiologist: Burt Knack   Subjective   Mrs. Piggee is an 83 year old female with a history of diabetes mellitus, hyperlipidemia admitted with a right hip fracture. She was found to have heme positive stools and progressive anemia.  She had a mechanical fall and was admitted on March 01, 2017.   She had IM intertrochanteric fixation of the right hip  She developed progressive shortness of breath shortness of breath was noticed to have  and symptomatic anemia.  Cardiology was consulted on November 25.  Troponin levels have trended upward since that time.  Echocardiogram was ordered and is still pending.    She has normal left ventricular systolic function by echo in June 2017.  She has grade 1 diastolic dysfunction.  She had moderate aortic stenosis in June, 2017.  Repeat echo is pending.  Inpatient Medications    Scheduled Meds: . acetaminophen  1,000 mg Oral Q8H  . carvedilol  3.125 mg Oral BID WC  . feeding supplement (GLUCERNA SHAKE)  237 mL Oral BID BM  . furosemide  40 mg Intravenous BID  . insulin aspart  0-15 Units Subcutaneous TID WC  . insulin aspart  0-5 Units Subcutaneous QHS  . insulin glargine  20 Units Subcutaneous QHS  . mouth rinse  15 mL Mouth Rinse BID  . pantoprazole  40 mg Oral Daily  . polyethylene glycol  17 g Oral Daily   Continuous Infusions:  PRN Meds: bisacodyl, ondansetron, oxyCODONE, senna-docusate   Vital Signs    Vitals:   03/05/17 2339 03/06/17 0211 03/06/17 0419 03/06/17 0723  BP: 127/89  108/73   Pulse: (!) 109  (!) 104   Resp: (!) 23 (!) 25 (!) 26   Temp: 97.7 F (36.5 C)   (!) 97.5 F (36.4 C)  TempSrc: Axillary  Axillary Oral  SpO2: 97% 95% 96% 94%  Weight:   164 lb 3.9 oz (74.5 kg)   Height:        Intake/Output Summary (Last 24 hours) at 03/06/2017 0816 Last data filed at 03/06/2017 4166 Gross per 24 hour  Intake 1096 ml  Output 1925 ml    Net -829 ml   Filed Weights   03/01/17 0354 03/05/17 0452 03/06/17 0419  Weight: 148 lb (67.1 kg) 166 lb 10.7 oz (75.6 kg) 164 lb 3.9 oz (74.5 kg)    Telemetry    NSR / sinus tach   - Personally Reviewed  ECG     NSR  - Personally Reviewed  Physical Exam   GEN:  frail-appearing, elderly female mild to moderate distressin mild to moderate distress Frail-appearing, elderly female  Neck: No JVD Cardiac:  regular rate A6-T0. 2/6 systolic murmur.  Resp decreased breath sounds bilaterally. MS: No edema; No deformity. Neuro:  Nonfocal  Psych: Normal affect   Labs    Chemistry Recent Labs  Lab 03/02/17 0347  03/04/17 0620 03/04/17 0953 03/05/17 0425 03/06/17 0157  NA 136   < > 129*  --  127* 130*  K 5.2*   < > 5.3* 5.4* 5.6* 4.3  CL 106   < > 101  --  100* 102  CO2 21*   < > 19*  --  18* 17*  GLUCOSE 213*   < > 211*  --  273* 190*  BUN 25*   < > 38*  --  58* 55*  CREATININE 1.05*   < > 1.14*  --  1.32*  1.13*  CALCIUM 7.7*   < > 8.0*  --  7.7* 7.5*  PROT 5.6*  --   --   --   --   --   ALBUMIN 2.6*  --   --   --   --   --   AST 21  --   --   --   --   --   ALT 12*  --   --   --   --   --   ALKPHOS 68  --   --   --   --   --   BILITOT 0.8  --   --   --   --   --   GFRNONAA 47*   < > 43*  --  36* 43*  GFRAA 55*   < > 49*  --  41* 50*  ANIONGAP 9   < > 9  --  9 11   < > = values in this interval not displayed.     Hematology Recent Labs  Lab 03/04/17 0805 03/05/17 0425 03/05/17 0808 03/06/17 0157  WBC 23.3* 23.5*  --  26.4*  RBC 3.08* 2.88*  --  3.67*  HGB 8.0* 7.5*  --  9.7*  HCT 24.8* 23.1*  --  29.4*  MCV 80.5 80.2  --  80.1  MCH 26.0 26.0  --  26.4  MCHC 32.3 32.5  --  33.0  RDW 15.2 15.1  --  15.7*  PLT 313 340 175 316    Cardiac Enzymes Recent Labs  Lab 03/05/17 1346 03/06/17 0157  TROPONINI 7.46* 9.43*   No results for input(s): TROPIPOC in the last 168 hours.   BNP Recent Labs  Lab 03/05/17 1346  BNP 3,581.4*     DDimer  Recent  Labs  Lab 03/05/17 0808  DDIMER 2.17*     Radiology    Dg Abd 1 View  Result Date: 03/05/2017 CLINICAL DATA:  Constipation. EXAM: ABDOMEN - 1 VIEW COMPARISON:  None. FINDINGS: The bowel gas pattern is normal. Moderate amount of stool seen throughout the colon and rectum. Atherosclerosis of abdominal aorta is noted. No nephrolithiasis is noted. IMPRESSION: Moderate stool burden is noted. No evidence of bowel obstruction or ileus. Electronically Signed   By: Marijo Conception, M.D.   On: 03/05/2017 09:41   US Renal  Result Date: 03/06/2017 CLINICAL DATA:  Acute kidney injury and history of diabetes. EXAM: RENAL / URINARY TRACT ULTRASOUND COMPLETE COMPARISON:  None. FINDINGS: Right Kidney: Length: 11.8 cm. Mild cortical thinning with normal echogenicity. No hydronephrosis. Simple cyst emanating from the lower pole measures 1.6 cm. No solid masses identified. Left Kidney: Length: 11.5 cm. Mild cortical thinning with normal echogenicity. No hydronephrosis. No solid masses identified. Bladder: The bladder is decompressed by a Foley catheter. IMPRESSION: Mild cortical atrophy of both kidneys. No evidence of renal obstruction. Electronically Signed   By: Aletta Edouard M.D.   On: 03/06/2017 07:59   Dg Chest Port 1 View  Result Date: 03/05/2017 CLINICAL DATA:  Patient with shortness of breath. EXAM: PORTABLE CHEST 1 VIEW COMPARISON:  Chest radiograph 03/01/2017 FINDINGS: Monitoring leads overlie the patient. Patient is rotated to the left. Stable enlarged cardiac and mediastinal contours. Aortic atherosclerosis. Small left pleural effusion. Left basilar heterogeneous opacities. No pneumothorax. IMPRESSION: Small left pleural effusion with underlying opacities favored to represent atelectasis. Cardiomegaly. Electronically Signed   By: Lovey Newcomer M.D.   On: 03/05/2017 14:09    Cardiac Studies  Patient Profile     83 y.o. female   [place]    1.  NSTEMI:  Pt has been having   progressive dyspnea. It is now clear that she has had a non-ST elevation myocardial infarction.  Echocardiogram is being done at   bedside right now.   Troponin is 8.9   She is having a GI bleed with symptomatic anemia .     She is not a candidate for cath or PCI. Continue medical management.   2.  Aortic stenosis:   Echo pending     For questions or updates, please contact [place] Please consult [email or web address] for contact info under Cardiology/STEMI.      Signed, Mertie Moores, MD  03/06/2017, 8:16 AM

## 2017-03-06 NOTE — Progress Notes (Signed)
  Echocardiogram 2D Echocardiogram has been performed.  [name] T Trevious Rampey 03/06/2017, 3:13 PM

## 2017-03-06 NOTE — Progress Notes (Signed)
CRITICAL VALUE ALERT  Critical Value:  Troponin 9.00  Date & Time Notied:  03/06/2017 1506  Provider Notified: dr. Maylene Roes  Orders Received/Actions taken:

## 2017-03-07 DIAGNOSIS — R195 Other fecal abnormalities: Secondary | ICD-10-CM

## 2017-03-07 DIAGNOSIS — R509 Fever, unspecified: Secondary | ICD-10-CM

## 2017-03-07 LAB — CBC WITH DIFFERENTIAL/PLATELET
BASOS ABS: 0 10*3/uL (ref 0.0–0.1)
Basophils Relative: 0 %
EOS PCT: 2 %
Eosinophils Absolute: 0.5 10*3/uL (ref 0.0–0.7)
HEMATOCRIT: 31.2 % — AB (ref 36.0–46.0)
HEMOGLOBIN: 10.3 g/dL — AB (ref 12.0–15.0)
LYMPHS PCT: 19 %
Lymphs Abs: 4.9 10*3/uL — ABNORMAL HIGH (ref 0.7–4.0)
MCH: 26.5 pg (ref 26.0–34.0)
MCHC: 33 g/dL (ref 30.0–36.0)
MCV: 80.4 fL (ref 78.0–100.0)
MONOS PCT: 10 %
Monocytes Absolute: 2.6 10*3/uL — ABNORMAL HIGH (ref 0.1–1.0)
NEUTROS PCT: 69 %
Neutro Abs: 17.6 10*3/uL — ABNORMAL HIGH (ref 1.7–7.7)
Platelets: 299 10*3/uL (ref 150–400)
RBC: 3.88 MIL/uL (ref 3.87–5.11)
RDW: 16 % — ABNORMAL HIGH (ref 11.5–15.5)
WBC: 25.6 10*3/uL — AB (ref 4.0–10.5)

## 2017-03-07 LAB — GLUCOSE, CAPILLARY
GLUCOSE-CAPILLARY: 165 mg/dL — AB (ref 65–99)
Glucose-Capillary: 136 mg/dL — ABNORMAL HIGH (ref 65–99)
Glucose-Capillary: 258 mg/dL — ABNORMAL HIGH (ref 65–99)
Glucose-Capillary: 107 mg/dL — ABNORMAL HIGH (ref 65–99)

## 2017-03-07 LAB — BASIC METABOLIC PANEL
ANION GAP: 10 (ref 5–15)
BUN: 41 mg/dL — ABNORMAL HIGH (ref 6–20)
CHLORIDE: 101 mmol/L (ref 101–111)
CO2: 24 mmol/L (ref 22–32)
Calcium: 7.4 mg/dL — ABNORMAL LOW (ref 8.9–10.3)
Creatinine, Ser: 0.85 mg/dL (ref 0.44–1.00)
GFR calc Af Amer: >60 mL/min (ref >60)
Glucose, Bld: 104 mg/dL — ABNORMAL HIGH (ref 65–99)
POTASSIUM: 3.5 mmol/L (ref 3.5–5.1)
SODIUM: 135 mmol/L (ref 135–145)

## 2017-03-07 LAB — UREA NITROGEN, URINE: Urea Nitrogen, Ur: 1050 mg/dL

## 2017-03-07 LAB — FOLATE RBC
FOLATE, HEMOLYSATE: 314.1 ng/mL
FOLATE, RBC: UNDETERMINED ng/mL

## 2017-03-07 LAB — TROPONIN I: TROPONIN I: 6.87 ng/mL — AB (ref <0.03)

## 2017-03-07 MED ORDER — ENOXAPARIN SODIUM 40 MG/0.4ML ~~LOC~~ SOLN
40.0000 mg | SUBCUTANEOUS | Status: DC
  Administered 2017-03-07 – 2017-03-16 (×10): 40 mg via SUBCUTANEOUS
  Filled 2017-03-07 (×10): qty 0.4

## 2017-03-07 MED ORDER — DOXYCYCLINE HYCLATE 100 MG PO TABS
100.0000 mg | ORAL_TABLET | Freq: Two times a day (BID) | ORAL | Status: AC
  Administered 2017-03-07 – 2017-03-13 (×14): 100 mg via ORAL
  Filled 2017-03-07 (×14): qty 1

## 2017-03-07 NOTE — Consult Note (Addendum)
   [facility] CM Inpatient Consult   03/07/2017  [name] 29-Dec-1929 [id number]  Patient referred for high risk list today in the [employer] plan.  Patient is an 83 year old with a HX of Diabetes, HTN, GERD hypothyroidism, admitted with a mechanical fall and sub-stained a right hip fracture underwent an IM fixation and ORIF for the right wrist fracture.  Met with the patient at the bedside.  Patient states, "I am not feeling good today."  Chart reviewed and patient's medical issues are ongoing.  Current disposition is for skilled nursing facility placement. Will follow for disposition.  Primary care provider: listed as [facility] [place]  Natividad Brood, RN BSN [facility] Liaison  [phone] business mobile phone Toll free office [phone]

## 2017-03-07 NOTE — Progress Notes (Addendum)
Physical Therapy Treatment Patient Details Name: [name] MRN: [id number] DOB: [birth year] Today's Date: 03/07/2017    History of Present Illness [name] a 83 y.o.femalewith medical history significantfor diabetes, hypertension, hyperlipidemia, anxiety, GERD, hypothyroidism, presenting after sustaining a mechanical fall onto her right hip. S/p IM intertrochanteric fixation of right hip, ORIF of right wrist 11/21.    PT Comments    Pt performed transfer to edge of bed and to partial standing in stedy frame.  Pt requires max +2 to total assistance +2 at this time for all mobilities.  Pt limited with mobility and continues to require SNF placement to improve strength and functional mobility.  Pt with bowel incontinence during transfer.  BP pre session 105/66 and BP post session 109/71.     Follow Up Recommendations  SNF;Supervision/Assistance - 24 hour     Equipment Recommendations  Wheelchair (measurements PT);Wheelchair cushion (measurements PT)    Recommendations for Other Services       Precautions / Restrictions Precautions Precautions: Fall Restrictions Weight Bearing Restrictions: Yes RUE Weight Bearing: Weight bear through elbow only RLE Weight Bearing: Weight bearing as tolerated Other Position/Activity Restrictions: no ROM at R wrist    Mobility  Bed Mobility Overal bed mobility: Needs Assistance Bed Mobility: Supine to Sit;Sit to Supine;Rolling     Supine to sit: Max assist;+2 for physical assistance;+2 for safety/equipment;HOB elevated Sit to supine: Total assist;+2 for physical assistance;+2 for safety/equipment;HOB elevated   General bed mobility comments: Pt very painful with bed mobility. Very Pt remains guarded throughout. Pt able to tolerate sitting at EOB for increased time.  Cues for hand placement and to avoid use of RUE, Patient required assistance for LE advancement and trunk elevation.  When returning to supine patient required  increased assistance to position in bed.  Multiple bouts of rolling once in bed to clean patient of bowel incontinence.    Transfers Overall transfer level: Needs assistance   Transfers: Sit to/from Stand           General transfer comment: Cues for L hand placement to pull into standing.  Pt required cues for pushing through B LEs.  Pt presents flexed at hips and trunk but able to elevate bottom from bed to sit in stedy frame while nursing changed bed to an air mattress.  Pt with poor activity tolerance.    Ambulation/Gait Ambulation/Gait assistance: (unable.  )               Stairs            Wheelchair Mobility    Modified Rankin (Stroke Patients Only)       Balance Overall balance assessment: Needs assistance   Sitting balance-Leahy Scale: Poor Sitting balance - Comments: min to mod assist for sitting balance.       Standing balance-Leahy Scale: Zero                              Cognition Arousal/Alertness: Awake/alert Behavior During Therapy: Anxious Overall Cognitive Status: Within Functional Limits for tasks assessed                                        Exercises      General Comments        Pertinent Vitals/Pain Pain Assessment: 0-10 Pain Score: 8  Pain Location: R leg and  wrist Pain Descriptors / Indicators: Discomfort;Guarding;Grimacing;Moaning;Tender Pain Intervention(s): Monitored during session;Repositioned;Ice applied    Home Living                      Prior Function            PT Goals (current goals can now be found in the care plan section) Acute Rehab PT Goals Patient Stated Goal: to decrease pain Potential to Achieve Goals: Fair Progress towards PT goals: Progressing toward goals    Frequency    Min 3X/week      PT Plan      Co-evaluation              AM-PAC PT "6 Clicks" Daily Activity  Outcome Measure  Difficulty turning over in bed (including adjusting  bedclothes, sheets and blankets)?: Unable Difficulty moving from lying on back to sitting on the side of the bed? : Unable Difficulty sitting down on and standing up from a chair with arms (e.g., wheelchair, bedside commode, etc,.)?: Unable Help needed moving to and from a bed to chair (including a wheelchair)?: Total Help needed walking in hospital room?: Total Help needed climbing 3-5 steps with a railing? : Total 6 Click Score: 6    End of Session Equipment Utilized During Treatment: Gait belt Activity Tolerance: Patient limited by pain Patient left: in bed;with call bell/phone within reach;with bed alarm set Nurse Communication: Mobility status PT Visit Diagnosis: Unsteadiness on feet (R26.81);Other abnormalities of gait and mobility (R26.89);History of falling (Z91.81);Muscle weakness (generalized) (M62.81);Pain Pain - Right/Left: Right Pain - part of body: Leg(wrist)     Time: 6433-2951 PT Time Calculation (min) (ACUTE ONLY): 27 min  Charges:  $Therapeutic Activity: 23-37 mins                    G CodesGovernor Rooks, PTA pager [phone]    Cristela Blue 03/07/2017, 4:30 PM

## 2017-03-07 NOTE — Progress Notes (Signed)
Daily Rounding Note  03/07/2017, 9:31 AM  LOS: 6 days   SUBJECTIVE:   Chief complaint: no BMs today.  No abd pain.  Appetite poor but no n/v.  Wrist and hip ache.  Feels SOB, no active cough    OBJECTIVE:         Vital signs in last 24 hours:    Temp:  [97.5 F (36.4 C)-99.1 F (37.3 C)] 99.1 F (37.3 C) (11/27 0904) Pulse Rate:  [94-104] 104 (11/27 0904) Resp:  [24-29] 26 (11/27 0904) BP: (103-119)/(65-84) 110/84 (11/27 0904) SpO2:  [95 %-97 %] 97 % (11/27 0904) Weight:  [77.1 kg (169 lb 15.6 oz)] 77.1 kg (169 lb 15.6 oz) (11/27 0542) Last BM Date: 03/05/17 Filed Weights   03/05/17 0452 03/06/17 0419 03/07/17 0542  Weight: 75.6 kg (166 lb 10.7 oz) 74.5 kg (164 lb 3.9 oz) 77.1 kg (169 lb 15.6 oz)   General: looks pale, exhausted but not acutely ill or in distress.  Looks better and more alert than yesterday   Heart: RRR Chest: crackles throughout left lung Abdomen: soft, NT, ND.  Active BS  Extremities: pedal edema Neuro/Psych:  Oriented x 3.  Follows commands.    Intake/Output from previous day: 11/26 0701 - 11/27 0700 In: 600 [P.O.:600] Out: 2250 [Urine:2250]  Intake/Output this shift: No intake/output data recorded.  Lab Results: Recent Labs    03/05/17 0425 03/05/17 0808 03/06/17 0157 03/07/17 0139  WBC 23.5*  --  26.4* 25.6*  HGB 7.5*  --  9.7* 10.3*  HCT 23.1*  --  29.4* 31.2*  PLT 340 175 316 299   BMET Recent Labs    03/05/17 0425 03/06/17 0157 03/07/17 0139  NA 127* 130* 135  K 5.6* 4.3 3.5  CL 100* 102 101  CO2 18* 17* 24  GLUCOSE 273* 190* 104*  BUN 58* 55* 41*  CREATININE 1.32* 1.13* 0.85  CALCIUM 7.7* 7.5* 7.4*   LFT No results for input(s): PROT, ALBUMIN, AST, ALT, ALKPHOS, BILITOT, BILIDIR, IBILI in the last 72 hours. PT/INR Recent Labs    03/05/17 0808  LABPROT 14.9  INR 1.18   Hepatitis Panel No results for input(s): HEPBSAG, HCVAB, HEPAIGM, HEPBIGM in the last  72 hours.  Studies/Results: Ct Abdomen Pelvis Wo Contrast  Result Date: 03/06/2017 CLINICAL DATA:  83 y/o  F; rule out retroperitoneal bleed. EXAM: CT ABDOMEN AND PELVIS WITHOUT CONTRAST TECHNIQUE: Multidetector CT imaging of the abdomen and pelvis was performed following the standard protocol without IV contrast. COMPARISON:  07/11/2007 CT abdomen and pelvis FINDINGS: Lower chest: Small hiatal hernia. Small bilateral pleural effusions. Dependent atelectasis in lower lobes. Severe coronary artery calcification. Aortic valvular calcification associated with aortic stenosis. Hepatobiliary: No focal liver abnormality is seen. No gallstones, gallbladder wall thickening, or biliary dilatation. Pancreas: Unremarkable. No pancreatic ductal dilatation or surrounding inflammatory changes. Spleen: Normal in size without focal abnormality. Adrenals/Urinary Tract: Normal adrenal glands. Right kidney interpolar 21 mm cyst. No hydronephrosis. Collapsed bladder around Foley catheter. Stomach/Bowel: Stomach is within normal limits. Appendix appears normal. Fat stranding within the mesial rectal fat and mild rectal wall thickening. Otherwise no evidence of bowel wall thickening, distention, or inflammatory changes. Vascular/Lymphatic: Aortic atherosclerosis. No enlarged abdominal or pelvic lymph nodes. Reproductive: Status post hysterectomy. No adnexal masses. Other: No abdominal wall hernia or abnormality. No abdominopelvic ascites. Musculoskeletal: Subcutaneous fat stranding within the left lower abdominal wall and flanks compatible with edema, probably third-spacing. Partially visualize comminuted fracture  of the right proximal femur extending into proximal diaphysis and intertrochanteric region. Right femoral intramedullary nail and femoral neck screw. IMPRESSION: 1. No retroperitoneal hematoma identified. 2. Mesorectal fat stranding and rectal wall thickening may represent proctitis. 3. Partially visualize comminuted  fracture of right proximal femur involving intertrochanteric region and proximal diaphysis with intramedullary nail fixation. 4. Small hiatal hernia. 5. Small bilateral pleural effusions. 6. Severe coronary and aortic calcific atherosclerosis. 7. Aortic valvular stenosis associated with aortic stenosis. Electronically Signed   By: Kristine Garbe M.D.   On: 03/06/2017 20:58   Dg Abd 1 View  Result Date: 03/05/2017 CLINICAL DATA:  Constipation. EXAM: ABDOMEN - 1 VIEW COMPARISON:  None. FINDINGS: The bowel gas pattern is normal. Moderate amount of stool seen throughout the colon and rectum. Atherosclerosis of abdominal aorta is noted. No nephrolithiasis is noted. IMPRESSION: Moderate stool burden is noted. No evidence of bowel obstruction or ileus. Electronically Signed   By: Marijo Conception, M.D.   On: 03/05/2017 09:41   US Renal  Result Date: 03/06/2017 CLINICAL DATA:  Acute kidney injury and history of diabetes. EXAM: RENAL / URINARY TRACT ULTRASOUND COMPLETE COMPARISON:  None. FINDINGS: Right Kidney: Length: 11.8 cm. Mild cortical thinning with normal echogenicity. No hydronephrosis. Simple cyst emanating from the lower pole measures 1.6 cm. No solid masses identified. Left Kidney: Length: 11.5 cm. Mild cortical thinning with normal echogenicity. No hydronephrosis. No solid masses identified. Bladder: The bladder is decompressed by a Foley catheter. IMPRESSION: Mild cortical atrophy of both kidneys. No evidence of renal obstruction. Electronically Signed   By: Aletta Edouard M.D.   On: 03/06/2017 07:59   Ct Femur Right Wo Contrast  Result Date: 03/07/2017 CLINICAL DATA:  Right femur fracture.  Right femur pain. EXAM: CT OF THE LOWER RIGHT EXTREMITY WITHOUT CONTRAST TECHNIQUE: Multidetector CT imaging of the right lower extremity was performed according to the standard protocol. COMPARISON:  None. FINDINGS: Bones/Joint/Cartilage Comminuted right intertrochanteric fracture with  subtrochanteric extension transfixed with a intramedullary nail and interlocking nails. No hardware failure or complication. Fracture is in anatomic alignment. No other fracture or dislocation. Joint space narrowing of the right hip. Normal alignment. Small knee joint effusion. Mild medial femorotibial compartment joint space narrowing. Ligaments Ligaments are suboptimally evaluated by CT. Muscles and Tendons Muscles are normal. No muscle atrophy. No intramuscular fluid collection or hematoma. Soft tissue Soft tissue edema in the subcutaneous fat along of the right thigh consistent with postsurgical changes. No fluid collection or hematoma. No soft tissue mass. Peripheral vascular atherosclerotic disease. IMPRESSION: Comminuted right intertrochanteric fracture with subtrochanteric extension transfixed with a intramedullary nail and interlocking nails. No hardware failure or complication. Fracture is in anatomic alignment. Soft tissue edema in the subcutaneous fat along of the right thigh consistent with postsurgical changes versus mild cellulitis. Electronically Signed   By: Kathreen Devoid   On: 03/07/2017 09:13   Dg Chest Port 1 View  Result Date: 03/05/2017 CLINICAL DATA:  Patient with shortness of breath. EXAM: PORTABLE CHEST 1 VIEW COMPARISON:  Chest radiograph 03/01/2017 FINDINGS: Monitoring leads overlie the patient. Patient is rotated to the left. Stable enlarged cardiac and mediastinal contours. Aortic atherosclerosis. Small left pleural effusion. Left basilar heterogeneous opacities. No pneumothorax. IMPRESSION: Small left pleural effusion with underlying opacities favored to represent atelectasis. Cardiomegaly. Electronically Signed   By: Lovey Newcomer M.D.   On: 03/05/2017 14:09   Scheduled Meds: . acetaminophen  1,000 mg Oral Q8H  . carvedilol  3.125 mg Oral BID WC  .  feeding supplement (GLUCERNA SHAKE)  237 mL Oral BID BM  . furosemide  40 mg Intravenous BID  . insulin aspart  0-15 Units  Subcutaneous TID WC  . insulin aspart  0-5 Units Subcutaneous QHS  . insulin aspart  5 Units Subcutaneous TID WC  . insulin glargine  22 Units Subcutaneous QHS  . mouth rinse  15 mL Mouth Rinse BID  . pantoprazole  40 mg Oral Daily  . polyethylene glycol  17 g Oral Daily   Continuous Infusions: PRN Meds:.bisacodyl, ondansetron, oxyCODONE, senna-docusate  ASSESMENT:   *  Anemia.  1/2 stools FOBT +.  No overt hemorrhaging.  Hgb improved and rising after PRBC x 2 on 11/25.  No RP hemorrhage per CT ? proctitis per CT; ? Is this source of FOBT +? She was quite constipated until 2 days ago.  Ischemic colitis does not usually effect the rectum but she has had BPS of 90s/60s a few days ago and BPs remain soft to low range of normotensive.      *  03/01/17 wrist and hip fx repairs.  No evidence for post op thigh hemorrhage on CT.    *  Non STEMI.  Severe coronary calcifications per CT.  No AC due to FOBT + anemia.  No plans for cath/PCI per Dr Acie Fredrickson 11/26.    Per Echo 11/26: EF 20 to 25%, moderate Ao stenosis and regurge, moderate MV regurge, mild tricuspid regurge Lovenox stopped 2 days ago.  PAS stockings not ordered  *  CKD 3.    *  Leukocytosis without fever.  ? Mild right thigh cellulitis per CT,  ? Due to proctitis.  Small left pleural effusion per 11/25 CXR, crackles throughout left lung on auscultation.     PLAN   *  ? Start back of Lovenox for DVT/PE prevention?  If not, needs PAS stockings.     Azucena Freed  03/07/2017, 9:31 AM Pager: [phone]    Attending physician's note   I have taken an interval history, reviewed the chart and examined the patient. I agree with the Advanced Practitioner's note, impression and recommendations. Hb has improved to 10.3. No overt bleeding noted.  OK to resume Lovenox from GI standpoint - defer decision to primary service. GI signing off. Outpatient GI follow up with Dr. Scarlette Shorts about 1 month after discharge.   Lucio Edward, MD  [facility] [phone] Mon-Fri 8a-5p [phone] after 5p, weekends, holidays

## 2017-03-07 NOTE — Progress Notes (Signed)
PT Cancellation Note  Patient Details Name: [name] MRN: [id number] DOB: July 27, 1929   Cancelled Treatment:    Reason Eval/Treat Not Completed: (P) Medical issues which prohibited therapy(Pt remains to present with elevated troponins trending up from 8.90 to 9.0.  Will defer PT at this time until troponins are trending down.  Pt is not appropriate for therapy from a medical stand point per PT policy.  )   [name] [name] 03/07/2017, 9:39 AM  [name], PTA pager [phone]

## 2017-03-07 NOTE — Progress Notes (Signed)
Progress Note  Patient Name: [name] Date of Encounter: 03/07/2017  Primary Cardiologist: Burt Knack   Subjective   Mrs. [name] is an 83 year old female with a history of diabetes mellitus, hyperlipidemia admitted with a right hip fracture. She had a mechanical fall and was admitted on March 01, 2017.   She had IM intertrochanteric fixation of the right hip  She was found to have heme positive stools and progressive anemia.  Troponin levels are consistent with a non-ST segment elevation myocardial infarction.  Echocardiogram from November 26 reveals severe left ventricular dysfunction with an EF of 20-25%.  She has severe hypokinesis of the mid apical anterior septal region.  She has grade 1 diastolic dysfunction. Moderate aortic stenosis.  Moderate aortic regurgitation. Moderate pulmonary hypertension with a estimated PA pressure of 50.   Inpatient Medications    Scheduled Meds: . acetaminophen  1,000 mg Oral Q8H  . carvedilol  3.125 mg Oral BID WC  . feeding supplement (GLUCERNA SHAKE)  237 mL Oral BID BM  . furosemide  40 mg Intravenous BID  . insulin aspart  0-15 Units Subcutaneous TID WC  . insulin aspart  0-5 Units Subcutaneous QHS  . insulin aspart  5 Units Subcutaneous TID WC  . insulin glargine  22 Units Subcutaneous QHS  . mouth rinse  15 mL Mouth Rinse BID  . pantoprazole  40 mg Oral Daily  . polyethylene glycol  17 g Oral Daily   Continuous Infusions:  PRN Meds: bisacodyl, ondansetron, oxyCODONE, senna-docusate   Vital Signs    Vitals:   03/06/17 2018 03/06/17 2347 03/07/17 0542 03/07/17 0904  BP: 103/65 103/69 119/78 110/84  Pulse: 94 96 96 (!) 104  Resp: (!) 27 (!) 24 (!) 27 (!) 26  Temp: (!) 97.5 F (36.4 C) 98.4 F (36.9 C) 98.4 F (36.9 C) 99.1 F (37.3 C)  TempSrc: Oral Axillary Oral Axillary  SpO2: 97% 97% 97% 97%  Weight:   169 lb 15.6 oz (77.1 kg)   Height:        Intake/Output Summary (Last 24 hours) at 03/07/2017 1057 Last  data filed at 03/06/2017 1830 Gross per 24 hour  Intake 600 ml  Output 2000 ml  Net -1400 ml   Filed Weights   03/05/17 0452 03/06/17 0419 03/07/17 0542  Weight: 166 lb 10.7 oz (75.6 kg) 164 lb 3.9 oz (74.5 kg) 169 lb 15.6 oz (77.1 kg)    Telemetry    NSR  - Personally Reviewed  ECG     NSR  - Personally Reviewed  Physical Exam   GEN: No acute distress.   Neck: No JVD Cardiac: GYK,5/9 systolic murmur  Respiratory: rales in bases  GI: Soft, nontender, non-distended  MS: No edema; No deformity. Neuro:  she is very weak and deconditioned   , unable to sit up by herself.  Psych: Normal affect   Labs    Chemistry Recent Labs  Lab 03/02/17 0347  03/05/17 0425 03/06/17 0157 03/07/17 0139  NA 136   < > 127* 130* 135  K 5.2*   < > 5.6* 4.3 3.5  CL 106   < > 100* 102 101  CO2 21*   < > 18* 17* 24  GLUCOSE 213*   < > 273* 190* 104*  BUN 25*   < > 58* 55* 41*  CREATININE 1.05*   < > 1.32* 1.13* 0.85  CALCIUM 7.7*   < > 7.7* 7.5* 7.4*  PROT 5.6*  --   --   --   --  ALBUMIN 2.6*  --   --   --   --   AST 21  --   --   --   --   ALT 12*  --   --   --   --   ALKPHOS 68  --   --   --   --   BILITOT 0.8  --   --   --   --   GFRNONAA 47*   < > 36* 43* >60  GFRAA 55*   < > 41* 50* >60  ANIONGAP 9   < > 9 11 10    < > = values in this interval not displayed.     Hematology Recent Labs  Lab 03/05/17 0425 03/05/17 0808 03/06/17 0157 03/07/17 0139  WBC 23.5*  --  26.4* 25.6*  RBC 2.88*  --  3.67* 3.88  HGB 7.5*  --  9.7* 10.3*  HCT 23.1*  --  29.4* 31.2*  MCV 80.2  --  80.1 80.4  MCH 26.0  --  26.4 26.5  MCHC 32.5  --  33.0 33.0  RDW 15.1  --  15.7* 16.0*  PLT 340 175 316 299    Cardiac Enzymes Recent Labs  Lab 03/05/17 1346 03/06/17 0157 03/06/17 0758 03/06/17 1339  TROPONINI 7.46* 9.43* 8.90* 9.00*   No results for input(s): TROPIPOC in the last 168 hours.   BNP Recent Labs  Lab 03/05/17 1346  BNP 3,581.4*     DDimer  Recent Labs  Lab  03/05/17 0808  DDIMER 2.17*     Radiology    Ct Abdomen Pelvis Wo Contrast  Result Date: 03/06/2017 CLINICAL DATA:  83 y/o  F; rule out retroperitoneal bleed. EXAM: CT ABDOMEN AND PELVIS WITHOUT CONTRAST TECHNIQUE: Multidetector CT imaging of the abdomen and pelvis was performed following the standard protocol without IV contrast. COMPARISON:  07/11/2007 CT abdomen and pelvis FINDINGS: Lower chest: Small hiatal hernia. Small bilateral pleural effusions. Dependent atelectasis in lower lobes. Severe coronary artery calcification. Aortic valvular calcification associated with aortic stenosis. Hepatobiliary: No focal liver abnormality is seen. No gallstones, gallbladder wall thickening, or biliary dilatation. Pancreas: Unremarkable. No pancreatic ductal dilatation or surrounding inflammatory changes. Spleen: Normal in size without focal abnormality. Adrenals/Urinary Tract: Normal adrenal glands. Right kidney interpolar 21 mm cyst. No hydronephrosis. Collapsed bladder around Foley catheter. Stomach/Bowel: Stomach is within normal limits. Appendix appears normal. Fat stranding within the mesial rectal fat and mild rectal wall thickening. Otherwise no evidence of bowel wall thickening, distention, or inflammatory changes. Vascular/Lymphatic: Aortic atherosclerosis. No enlarged abdominal or pelvic lymph nodes. Reproductive: Status post hysterectomy. No adnexal masses. Other: No abdominal wall hernia or abnormality. No abdominopelvic ascites. Musculoskeletal: Subcutaneous fat stranding within the left lower abdominal wall and flanks compatible with edema, probably third-spacing. Partially visualize comminuted fracture of the right proximal femur extending into proximal diaphysis and intertrochanteric region. Right femoral intramedullary nail and femoral neck screw. IMPRESSION: 1. No retroperitoneal hematoma identified. 2. Mesorectal fat stranding and rectal wall thickening may represent proctitis. 3. Partially  visualize comminuted fracture of right proximal femur involving intertrochanteric region and proximal diaphysis with intramedullary nail fixation. 4. Small hiatal hernia. 5. Small bilateral pleural effusions. 6. Severe coronary and aortic calcific atherosclerosis. 7. Aortic valvular stenosis associated with aortic stenosis. Electronically Signed   By: Kristine Garbe M.D.   On: 03/06/2017 20:58   US Renal  Result Date: 03/06/2017 CLINICAL DATA:  Acute kidney injury and history of diabetes. EXAM: RENAL / URINARY TRACT ULTRASOUND  COMPLETE COMPARISON:  None. FINDINGS: Right Kidney: Length: 11.8 cm. Mild cortical thinning with normal echogenicity. No hydronephrosis. Simple cyst emanating from the lower pole measures 1.6 cm. No solid masses identified. Left Kidney: Length: 11.5 cm. Mild cortical thinning with normal echogenicity. No hydronephrosis. No solid masses identified. Bladder: The bladder is decompressed by a Foley catheter. IMPRESSION: Mild cortical atrophy of both kidneys. No evidence of renal obstruction. Electronically Signed   By: Aletta Edouard M.D.   On: 03/06/2017 07:59   Ct Femur Right Wo Contrast  Result Date: 03/07/2017 CLINICAL DATA:  Right femur fracture.  Right femur pain. EXAM: CT OF THE LOWER RIGHT EXTREMITY WITHOUT CONTRAST TECHNIQUE: Multidetector CT imaging of the right lower extremity was performed according to the standard protocol. COMPARISON:  None. FINDINGS: Bones/Joint/Cartilage Comminuted right intertrochanteric fracture with subtrochanteric extension transfixed with a intramedullary nail and interlocking nails. No hardware failure or complication. Fracture is in anatomic alignment. No other fracture or dislocation. Joint space narrowing of the right hip. Normal alignment. Small knee joint effusion. Mild medial femorotibial compartment joint space narrowing. Ligaments Ligaments are suboptimally evaluated by CT. Muscles and Tendons Muscles are normal. No muscle  atrophy. No intramuscular fluid collection or hematoma. Soft tissue Soft tissue edema in the subcutaneous fat along of the right thigh consistent with postsurgical changes. No fluid collection or hematoma. No soft tissue mass. Peripheral vascular atherosclerotic disease. IMPRESSION: Comminuted right intertrochanteric fracture with subtrochanteric extension transfixed with a intramedullary nail and interlocking nails. No hardware failure or complication. Fracture is in anatomic alignment. Soft tissue edema in the subcutaneous fat along of the right thigh consistent with postsurgical changes versus mild cellulitis. Electronically Signed   By: Kathreen Devoid   On: 03/07/2017 09:13   Dg Chest Port 1 View  Result Date: 03/05/2017 CLINICAL DATA:  Patient with shortness of breath. EXAM: PORTABLE CHEST 1 VIEW COMPARISON:  Chest radiograph 03/01/2017 FINDINGS: Monitoring leads overlie the patient. Patient is rotated to the left. Stable enlarged cardiac and mediastinal contours. Aortic atherosclerosis. Small left pleural effusion. Left basilar heterogeneous opacities. No pneumothorax. IMPRESSION: Small left pleural effusion with underlying opacities favored to represent atelectasis. Cardiomegaly. Electronically Signed   By: Lovey Newcomer M.D.   On: 03/05/2017 14:09    Cardiac Studies      Patient Profile     83 y.o. female with hip fracture with peri-op MI   Assessment & Plan    1.  CAD :   S/p NSTEMI.   No further angina   Will start ASA 81 mg a day  She is not strong enough for cath or interventional / invasive procedures  2. Acute systolic / diastolic CHF:   Due to NSTEMI EF 20-25%  Has rales in bases.   Will give lasix Will start low dose metoprolol once she is better diuresed  I/O = -3.5 liters so far this admission   3.  GI bleed:  Seems to be stable   4.  Aortic stenosis / aortic insufficiency:  Has mod AS and mod AI. Continue medical therapy .      For questions or updates, please  contact [place] [facility] Please consult [email or web address] for contact info under Cardiology/STEMI.      Signed, Mertie Moores, MD  03/07/2017, 10:57 AM

## 2017-03-07 NOTE — Progress Notes (Addendum)
PROGRESS NOTE    ANNAI [name]  XLK:[id number] DOB: 03/07/1930 DOA: 03/01/2017 PCP: Jinny Sanders, MD     Brief Narrative:  [name] is a 83 y.o. female with medical history significant for diabetes, hypertension, hyperlipidemia, anxiety, GERD, hypothyroidism, presenting after sustaining a mechanical fall onto her right hip when she was reaching over to the commode for the lid. She had right wrist pain and some swelling.  Patient reports trying to go to the bathroom, when she lost balance, with no loss of consciousness, or hitting her head.  She denies any dizziness, presyncope or syncope.  The patient denies any history of seizures.  She denies any chest pain, palpitations, or shortness of breath or cough.  She denies any abdominal pain, nausea or vomiting.  Denies any focal weakness, numbness or tingling.  The patient was immobilized, and a splint was placed on her right wrist as well.  Orthopedic surgery was consulted and patient underwent IM intertrochanteric fixation right hip, ORIF of right wrist 11/21 with Dr. Griffin Basil.  Hospitalization has been complicated by dyspnea, NSTEMI, anemia.   Assessment & Plan:   Principal Problem:   Closed right hip fracture ([place]) Active Problems:   Secondary DM with neurological manifestations ([place])   Diabetic peripheral neuropathy associated with type 2 diabetes mellitus (HCC)   Hyperlipidemia   GERD   Chronic constipation   Systolic murmur   Falls frequently   Pressure injury of skin   Right hip and right wrist fracture after mechanical fall -S/p IM intertrochanteric fixation of right hip, ORIF of right wrist 11/21 with Dr. Griffin Basil. Follow up in 10-14 days. PT OT recommending SNF  -Lovenox for DVT ppx on hold due to anemia. SCDs    NSTEMI  Acute systolic and diastolic heart failure  -EKG showed anterolateral ST depression. Cardiology consulted. Known moderate AS and post op tachycardia and anemia may cause subendothelial ischemia. Coreg  added.  -CXR small left pleural effusion, atelectasis, cardiomegaly  -BNP 3581  -Trop 7.46 --> 9.43 --> 8.9 --> 9 --> 6.87. No heparin due to anemia. Not candidate for cath  -Echo showed EF 20-25%, severe hypokinesis of mid-apical, anteroseptal, inferior, inferoseptal myocardium, grade 1 diastolic dysfunction  -Lasix 40mg  IV BID  -Cardiology following   Symptomatic acute blood loss anemia -Baseline Hgb/Hct 13.8/41.9 in August 2018  -Iron, B12 within normal limits, ferritin mildly elevated. Folate pending  -DIC labs, inconsistent with DIC  -LDH elevated 401, no schistocytes on smear  -Transfused 2u pRBC 11/25  -CT abd/pelvis and CT right femur negative for retroperitoneal bleed or hematoma  -FOBT positive. Consulted GI. Patient had EGD and colonoscopy in the past with  GI, Dr. Henrene Pastor. Plan for outpatient colonoscopy +/- EGD in 2-3 months once acute illnesses resolve   Leukocytosis -?reactive to post-op stress and pain, acute illnesses as above. UA negative. Blood cultures negative. Afebrile.  -?mild cellulitis vs post-op change read on CT right femur, however, no clinical sign of erythema or infection of right thigh. Incision is clean and dressing dry.  -?proctitis on CT abd/pelvis  -Empiric doxycycline started today 11/27. Continue to trend CBC   Hyponatremia -Resolved    Hyperkalemia -Resolved   AKI on CKD stage 3 -Baseline Cr 1 -Hold IVF. Avoid nephrotoxin  -Renal US negative for obstruction/hydronephrosis -Back to baseline now   DM type 2 -Ha1c 6.6  -Lantus, SSI  Hypertension  -Hold lisinopril for marginal BP, AKI. Coreg added   Abnormal TSH  -TSH low 0.294, Free T4  normal 1.10.  -Repeat TSH/T4 as outpatient once acute illnesses resolve   Constipation -Resolved   Sacral and right heel stage 1, not POA  -Wound RN consulted -Turn every 2 hours, offload pressure    DVT prophylaxis: SCDs  Code Status: full Family Communication: No family at bedside, spoke  with daughter over the phone x 2 and granddaughter who is a Education officer, environmental in [place]  Disposition Plan: SNF when stable   Consultants:   Orthopedic surgery  Cardiology   GI   Procedures:   Intramedullary nail intertrochanteric on right 11/21   Open reduction internal fixation write on right 11/21  Antimicrobials:  Anti-infectives (From admission, onward)   Start     Dose/Rate Route Frequency Ordered Stop   03/01/17 0815  ceFAZolin (ANCEF) IVPB 2g/100 mL premix     2 g 200 mL/hr over 30 Minutes Intravenous On call to O.R. 03/01/17 0757 03/01/17 0845       Subjective: No new complaints, pain well controlled. Denies CP. Admits to shallow breathing. States she feels about the same.    Objective: Vitals:   03/06/17 2018 03/06/17 2347 03/07/17 0542 03/07/17 0904  BP: 103/65 103/69 119/78 110/84  Pulse: 94 96 96 (!) 104  Resp: (!) 27 (!) 24 (!) 27 (!) 26  Temp: (!) 97.5 F (36.4 C) 98.4 F (36.9 C) 98.4 F (36.9 C) 99.1 F (37.3 C)  TempSrc: Oral Axillary Oral Axillary  SpO2: 97% 97% 97% 97%  Weight:   77.1 kg (169 lb 15.6 oz)   Height:        Intake/Output Summary (Last 24 hours) at 03/07/2017 1156 Last data filed at 03/06/2017 1830 Gross per 24 hour  Intake 360 ml  Output 1100 ml  Net -740 ml   Filed Weights   03/05/17 0452 03/06/17 0419 03/07/17 0542  Weight: 75.6 kg (166 lb 10.7 oz) 74.5 kg (164 lb 3.9 oz) 77.1 kg (169 lb 15.6 oz)    Examination:  General exam: Appears calm  Respiratory system: Clear to auscultation. Decreased inspiratory effort. Tachypneic rate 20s. On [place] O2. Cardiovascular system: S1 & S2 heard, tachycardic, regular rhythm. +murmur  Gastrointestinal system: Abdomen is nondistended, soft and nontender. No organomegaly or masses felt. Normal bowel sounds heard. Central nervous system: Alert and oriented. No focal neurological deficits.  Extremities: +right wrist in dressing/wrap, +right hip dressing clean, +right thigh edema without  erythema  Skin: +stage 1 right heel  Psychiatry: Judgement and insight appear normal. Mood & affect appropriate.   Data Reviewed: I have personally reviewed following labs and imaging studies  CBC: Recent Labs  Lab 03/01/17 0359  03/03/17 0709 03/04/17 0805 03/04/17 0953 03/05/17 0425 03/05/17 0808 03/06/17 0157 03/07/17 0139  WBC 19.5*   < > 15.8* 23.3*  --  23.5*  --  26.4* 25.6*  NEUTROABS 15.2*  --   --   --  15.5*  --   --   --  17.6*  HGB 12.5   < > 8.6* 8.0*  --  7.5*  --  9.7* 10.3*  HCT 38.5   < > 27.0* 24.8*  --  23.1*  --  29.4* 31.2*  MCV 81.6   < > 81.8 80.5  --  80.2  --  80.1 80.4  PLT 327   < > 268 313  --  340 175 316 299   < > = values in this interval not displayed.   Basic Metabolic Panel: Recent Labs  Lab 03/03/17 1355 03/04/17 6948  03/04/17 0805 03/04/17 0953 03/05/17 0425 03/06/17 0157 03/07/17 0139  NA 129* 129*  --   --  127* 130* 135  K 4.8 5.3*  --  5.4* 5.6* 4.3 3.5  CL 102 101  --   --  100* 102 101  CO2 20* 19*  --   --  18* 17* 24  GLUCOSE 268* 211*  --   --  273* 190* 104*  BUN 27* 38*  --   --  58* 55* 41*  CREATININE 1.07* 1.14*  --   --  1.32* 1.13* 0.85  CALCIUM 7.7* 8.0*  --   --  7.7* 7.5* 7.4*  MG  --   --  2.1  --   --   --   --   PHOS  --   --  2.7  --   --   --   --    GFR: Estimated Creatinine Clearance: 48.1 mL/min (by C-G formula based on SCr of 0.85 mg/dL). Liver Function Tests: Recent Labs  Lab 03/02/17 0347  AST 21  ALT 12*  ALKPHOS 68  BILITOT 0.8  PROT 5.6*  ALBUMIN 2.6*   No results for input(s): LIPASE, AMYLASE in the last 168 hours. No results for input(s): AMMONIA in the last 168 hours. Coagulation Profile: Recent Labs  Lab 03/01/17 0359 03/01/17 1555 03/02/17 0347 03/05/17 0808  INR 0.94 1.00 1.07 1.18   Cardiac Enzymes: Recent Labs  Lab 03/04/17 1230 03/05/17 1346 03/06/17 0157 03/06/17 0758 03/06/17 1339 03/07/17 0953  CKTOTAL 839*  --   --   --   --   --   TROPONINI  --  7.46*  9.43* 8.90* 9.00* 6.87*   BNP (last 3 results) No results for input(s): PROBNP in the last 8760 hours. HbA1C: No results for input(s): HGBA1C in the last 72 hours. CBG: Recent Labs  Lab 03/06/17 0731 03/06/17 1215 03/06/17 1605 03/06/17 2225 03/07/17 0739  GLUCAP 242* 255* 212* 124* 165*   Lipid Profile: No results for input(s): CHOL, HDL, LDLCALC, TRIG, CHOLHDL, LDLDIRECT in the last 72 hours. Thyroid Function Tests: Recent Labs    03/05/17 1202 03/05/17 1346  TSH 0.294*  --   FREET4  --  1.10   Anemia Panel: Recent Labs    03/05/17 0808  VITAMINB12 637  FERRITIN 316*  TIBC 272  IRON 35   Sepsis Labs: Recent Labs  Lab 03/05/17 1200 03/06/17 0157 03/06/17 0758  LATICACIDVEN 4.1* 1.4 1.5    Recent Results (from the past 240 hour(s))  Culture, blood (Routine X 2) w Reflex to ID Panel     Status: None   Collection Time: 03/01/17  3:55 PM  Result Value Ref Range Status   Specimen Description BLOOD BLOOD LEFT WRIST  Final   Special Requests   Final    IN PEDIATRIC BOTTLE Blood Culture results may not be optimal due to an excessive volume of blood received in culture bottles   Culture NO GROWTH 5 DAYS  Final   Report Status 03/06/2017 FINAL  Final  Culture, blood (Routine X 2) w Reflex to ID Panel     Status: None   Collection Time: 03/01/17  4:03 PM  Result Value Ref Range Status   Specimen Description BLOOD RIGHT HAND  Final   Special Requests IN PEDIATRIC BOTTLE Blood Culture adequate volume  Final   Culture NO GROWTH 5 DAYS  Final   Report Status 03/06/2017 FINAL  Final  MRSA PCR Screening  Status: None   Collection Time: 03/05/17  6:37 PM  Result Value Ref Range Status   MRSA by PCR NEGATIVE NEGATIVE Final    Comment:        The GeneXpert MRSA Assay (FDA approved for NASAL specimens only), is one component of a comprehensive MRSA colonization surveillance program. It is not intended to diagnose MRSA infection nor to guide or monitor treatment  for MRSA infections.        Radiology Studies: Ct Abdomen Pelvis Wo Contrast  Result Date: 03/06/2017 CLINICAL DATA:  83 y/o  F; rule out retroperitoneal bleed. EXAM: CT ABDOMEN AND PELVIS WITHOUT CONTRAST TECHNIQUE: Multidetector CT imaging of the abdomen and pelvis was performed following the standard protocol without IV contrast. COMPARISON:  07/11/2007 CT abdomen and pelvis FINDINGS: Lower chest: Small hiatal hernia. Small bilateral pleural effusions. Dependent atelectasis in lower lobes. Severe coronary artery calcification. Aortic valvular calcification associated with aortic stenosis. Hepatobiliary: No focal liver abnormality is seen. No gallstones, gallbladder wall thickening, or biliary dilatation. Pancreas: Unremarkable. No pancreatic ductal dilatation or surrounding inflammatory changes. Spleen: Normal in size without focal abnormality. Adrenals/Urinary Tract: Normal adrenal glands. Right kidney interpolar 21 mm cyst. No hydronephrosis. Collapsed bladder around Foley catheter. Stomach/Bowel: Stomach is within normal limits. Appendix appears normal. Fat stranding within the mesial rectal fat and mild rectal wall thickening. Otherwise no evidence of bowel wall thickening, distention, or inflammatory changes. Vascular/Lymphatic: Aortic atherosclerosis. No enlarged abdominal or pelvic lymph nodes. Reproductive: Status post hysterectomy. No adnexal masses. Other: No abdominal wall hernia or abnormality. No abdominopelvic ascites. Musculoskeletal: Subcutaneous fat stranding within the left lower abdominal wall and flanks compatible with edema, probably third-spacing. Partially visualize comminuted fracture of the right proximal femur extending into proximal diaphysis and intertrochanteric region. Right femoral intramedullary nail and femoral neck screw. IMPRESSION: 1. No retroperitoneal hematoma identified. 2. Mesorectal fat stranding and rectal wall thickening may represent proctitis. 3. Partially  visualize comminuted fracture of right proximal femur involving intertrochanteric region and proximal diaphysis with intramedullary nail fixation. 4. Small hiatal hernia. 5. Small bilateral pleural effusions. 6. Severe coronary and aortic calcific atherosclerosis. 7. Aortic valvular stenosis associated with aortic stenosis. Electronically Signed   By: Kristine Garbe M.D.   On: 03/06/2017 20:58   US Renal  Result Date: 03/06/2017 CLINICAL DATA:  Acute kidney injury and history of diabetes. EXAM: RENAL / URINARY TRACT ULTRASOUND COMPLETE COMPARISON:  None. FINDINGS: Right Kidney: Length: 11.8 cm. Mild cortical thinning with normal echogenicity. No hydronephrosis. Simple cyst emanating from the lower pole measures 1.6 cm. No solid masses identified. Left Kidney: Length: 11.5 cm. Mild cortical thinning with normal echogenicity. No hydronephrosis. No solid masses identified. Bladder: The bladder is decompressed by a Foley catheter. IMPRESSION: Mild cortical atrophy of both kidneys. No evidence of renal obstruction. Electronically Signed   By: Aletta Edouard M.D.   On: 03/06/2017 07:59   Ct Femur Right Wo Contrast  Result Date: 03/07/2017 CLINICAL DATA:  Right femur fracture.  Right femur pain. EXAM: CT OF THE LOWER RIGHT EXTREMITY WITHOUT CONTRAST TECHNIQUE: Multidetector CT imaging of the right lower extremity was performed according to the standard protocol. COMPARISON:  None. FINDINGS: Bones/Joint/Cartilage Comminuted right intertrochanteric fracture with subtrochanteric extension transfixed with a intramedullary nail and interlocking nails. No hardware failure or complication. Fracture is in anatomic alignment. No other fracture or dislocation. Joint space narrowing of the right hip. Normal alignment. Small knee joint effusion. Mild medial femorotibial compartment joint space narrowing. Ligaments Ligaments are suboptimally evaluated by  CT. Muscles and Tendons Muscles are normal. No muscle  atrophy. No intramuscular fluid collection or hematoma. Soft tissue Soft tissue edema in the subcutaneous fat along of the right thigh consistent with postsurgical changes. No fluid collection or hematoma. No soft tissue mass. Peripheral vascular atherosclerotic disease. IMPRESSION: Comminuted right intertrochanteric fracture with subtrochanteric extension transfixed with a intramedullary nail and interlocking nails. No hardware failure or complication. Fracture is in anatomic alignment. Soft tissue edema in the subcutaneous fat along of the right thigh consistent with postsurgical changes versus mild cellulitis. Electronically Signed   By: Kathreen Devoid   On: 03/07/2017 09:13   Dg Chest Port 1 View  Result Date: 03/05/2017 CLINICAL DATA:  Patient with shortness of breath. EXAM: PORTABLE CHEST 1 VIEW COMPARISON:  Chest radiograph 03/01/2017 FINDINGS: Monitoring leads overlie the patient. Patient is rotated to the left. Stable enlarged cardiac and mediastinal contours. Aortic atherosclerosis. Small left pleural effusion. Left basilar heterogeneous opacities. No pneumothorax. IMPRESSION: Small left pleural effusion with underlying opacities favored to represent atelectasis. Cardiomegaly. Electronically Signed   By: Lovey Newcomer M.D.   On: 03/05/2017 14:09      Scheduled Meds: . acetaminophen  1,000 mg Oral Q8H  . carvedilol  3.125 mg Oral BID WC  . feeding supplement (GLUCERNA SHAKE)  237 mL Oral BID BM  . furosemide  40 mg Intravenous BID  . insulin aspart  0-15 Units Subcutaneous TID WC  . insulin aspart  0-5 Units Subcutaneous QHS  . insulin aspart  5 Units Subcutaneous TID WC  . insulin glargine  22 Units Subcutaneous QHS  . mouth rinse  15 mL Mouth Rinse BID  . pantoprazole  40 mg Oral Daily  . polyethylene glycol  17 g Oral Daily   Continuous Infusions:    LOS: 6 days    Time spent: 40 minutes   Dessa Phi, DO [facility] [email or web address] Password [facility] 03/07/2017, 11:56 AM

## 2017-03-07 NOTE — Progress Notes (Signed)
ANTICOAGULATION CONSULT NOTE - Initial Consult  Pharmacy Consult for Lovenox Indication: VTE prophylaxis  Allergies  Allergen Reactions  . Codeine     Rapid heart rate  . Metformin     unspecified  . Penicillins     unspecified  . Pioglitazone     REACTION: rash  . Fenofibrate Palpitations    Patient Measurements: Height: 5' 3.5" (161.3 cm) Weight: 169 lb 15.6 oz (77.1 kg) IBW/kg (Calculated) : 53.55 Lovenox Dosing Weight: 77 kg  Vital Signs: Temp: 97.5 F (36.4 C) (11/27 1646) Temp Source: Oral (11/27 1646) BP: 104/63 (11/27 1646) Pulse Rate: 88 (11/27 1646)  Labs: Recent Labs    03/05/17 0425 03/05/17 0808  03/06/17 0157 03/06/17 0758 03/06/17 1339 03/07/17 0139 03/07/17 0953  HGB 7.5*  --   --  9.7*  --   --  10.3*  --   HCT 23.1* CLOT9  --  29.4*  --   --  31.2*  --   PLT 340 175  --  316  --   --  299  --   APTT  --  27  --   --   --   --   --   --   LABPROT  --  14.9  --   --   --   --   --   --   INR  --  1.18  --   --   --   --   --   --   CREATININE 1.32*  --   --  1.13*  --   --  0.85  --   TROPONINI  --   --    < > 9.43* 8.90* 9.00*  --  6.87*   < > = values in this interval not displayed.    Estimated Creatinine Clearance: 48.1 mL/min (by C-G formula based on SCr of 0.85 mg/dL).   Medical History: Past Medical History:  Diagnosis Date  . Anxiety   . Diaphragmatic hernia without mention of obstruction or gangrene   . Diverticulosis of colon (without mention of hemorrhage) 2009  . Fibromyalgia   . GERD (gastroesophageal reflux disease)   . Hiatal hernia 2009   s/p nissen fundoplication 0998.    Marland Kitchen Hx of adenomatous colonic polyps 2007, 2009   Adenomatous polyps in 2007 and 2009.  Hyperplastic polyps in 2009  . Hyperlipidemia   . IBS (irritable bowel syndrome)   . Internal hemorrhoids without mention of complication 3382   Seen at colonoscopy.  . Thyroid disease   . Type II or unspecified type diabetes mellitus with neurological  manifestations, not stated as uncontrolled(250.60)    Assessment:   83 yr old female to resume Lovenox for VTE prophylaxis.  Fall pta, s/p IM nail right hip and ORIF right wrist on 03/01/17. Post-op NSTEMI.  Received Lovenox 40 mg sq q24hrs 11/22>>11/25. Changed to SCDs when Hgb dropped and had FOB + stool. No retroperitoneal bleed or hematoma. GI consulted, okayed to resume Lovenox.  CBC improved after 2 units PRBCs on 11/25.  On Protonix 40 mg PO daily.  Goal of Therapy:  appropriate Lovenox dose for indication Monitor platelets by anticoagulation protocol: Yes   Plan:   Resume Lovenox 40 mg sq q24hrs.  Follow up CBC.  Monitor for any s/sx bleeding.  Resume ASA 81 mg daily?    Arty Baumgartner, [place] Pager: [phone] 03/07/2017,7:33 PM

## 2017-03-08 DIAGNOSIS — R296 Repeated falls: Secondary | ICD-10-CM

## 2017-03-08 DIAGNOSIS — I5043 Acute on chronic combined systolic (congestive) and diastolic (congestive) heart failure: Secondary | ICD-10-CM

## 2017-03-08 DIAGNOSIS — K5909 Other constipation: Secondary | ICD-10-CM

## 2017-03-08 DIAGNOSIS — E1349 Other specified diabetes mellitus with other diabetic neurological complication: Secondary | ICD-10-CM

## 2017-03-08 LAB — GLUCOSE, CAPILLARY
GLUCOSE-CAPILLARY: 63 mg/dL — AB (ref 65–99)
GLUCOSE-CAPILLARY: 130 mg/dL — AB (ref 65–99)
GLUCOSE-CAPILLARY: 124 mg/dL — AB (ref 65–99)
Glucose-Capillary: 131 mg/dL — ABNORMAL HIGH (ref 65–99)

## 2017-03-08 LAB — BASIC METABOLIC PANEL
ANION GAP: 6 (ref 5–15)
BUN: 33 mg/dL — ABNORMAL HIGH (ref 6–20)
CALCIUM: 7.4 mg/dL — AB (ref 8.9–10.3)
CO2: 26 mmol/L (ref 22–32)
Chloride: 101 mmol/L (ref 101–111)
Creatinine, Ser: 0.76 mg/dL (ref 0.44–1.00)
GLUCOSE: 67 mg/dL (ref 65–99)
POTASSIUM: 3.7 mmol/L (ref 3.5–5.1)
Sodium: 133 mmol/L — ABNORMAL LOW (ref 135–145)

## 2017-03-08 LAB — CBC WITH DIFFERENTIAL/PLATELET
BASOS ABS: 0 10*3/uL (ref 0.0–0.1)
BASOS PCT: 0 %
Eosinophils Absolute: 0.3 10*3/uL (ref 0.0–0.7)
Eosinophils Relative: 1 %
HEMATOCRIT: 32.4 % — AB (ref 36.0–46.0)
HEMOGLOBIN: 10.5 g/dL — AB (ref 12.0–15.0)
LYMPHS PCT: 18 %
Lymphs Abs: 4.1 10*3/uL — ABNORMAL HIGH (ref 0.7–4.0)
MCH: 26.9 pg (ref 26.0–34.0)
MCHC: 32.4 g/dL (ref 30.0–36.0)
MCV: 83.1 fL (ref 78.0–100.0)
Monocytes Absolute: 2.3 10*3/uL — ABNORMAL HIGH (ref 0.1–1.0)
Monocytes Relative: 10 %
NEUTROS ABS: 15.8 10*3/uL — AB (ref 1.7–7.7)
NEUTROS PCT: 71 %
Platelets: 282 10*3/uL (ref 150–400)
RBC: 3.9 MIL/uL (ref 3.87–5.11)
RDW: 16.6 % — ABNORMAL HIGH (ref 11.5–15.5)
WBC: 22.6 10*3/uL — ABNORMAL HIGH (ref 4.0–10.5)

## 2017-03-08 LAB — MAGNESIUM: Magnesium: 1.9 mg/dL (ref 1.7–2.4)

## 2017-03-08 MED ORDER — INSULIN GLARGINE 100 UNIT/ML ~~LOC~~ SOLN
20.0000 [IU] | Freq: Every day | SUBCUTANEOUS | Status: DC
  Administered 2017-03-08 – 2017-03-15 (×8): 20 [IU] via SUBCUTANEOUS
  Filled 2017-03-08 (×9): qty 0.2

## 2017-03-08 MED ORDER — GERHARDT'S BUTT CREAM
TOPICAL_CREAM | CUTANEOUS | Status: DC | PRN
  Filled 2017-03-08: qty 1

## 2017-03-08 MED ORDER — CARVEDILOL 6.25 MG PO TABS
6.2500 mg | ORAL_TABLET | Freq: Two times a day (BID) | ORAL | Status: DC
  Administered 2017-03-08 – 2017-03-17 (×17): 6.25 mg via ORAL
  Filled 2017-03-08: qty 1
  Filled 2017-03-08: qty 2
  Filled 2017-03-08 (×8): qty 1
  Filled 2017-03-08: qty 2
  Filled 2017-03-08 (×7): qty 1

## 2017-03-08 NOTE — Progress Notes (Signed)
PROGRESS NOTE    [name]  BCW:[id number] DOB: 1929-05-02 DOA: 03/01/2017 PCP: Jinny Sanders, MD     Brief Narrative:  [name] is a 83 y.o. female with medical history significant for diabetes, hypertension, hyperlipidemia, anxiety, GERD, hypothyroidism, presenting after sustaining a mechanical fall onto her right hip when she was reaching over to the commode for the lid. She had right wrist pain and some swelling.  Patient reports trying to go to the bathroom, when she lost balance, with no loss of consciousness, or hitting her head.  She denies any dizziness, presyncope or syncope.  The patient denies any history of seizures.  She denies any chest pain, palpitations, or shortness of breath or cough.  She denies any abdominal pain, nausea or vomiting.  Denies any focal weakness, numbness or tingling.  The patient was immobilized, and a splint was placed on her right wrist as well.  Orthopedic surgery was consulted and patient underwent IM intertrochanteric fixation right hip, ORIF of right wrist 11/21 with Dr. Griffin Basil.  Hospitalization has been complicated by dyspnea, NSTEMI, anemia.  Today, pt denies any new complaints, reports being tired. Denies any chest pain, SOB, abdominal pain, N/V/D/C, fever/chills.   Assessment & Plan:   Principal Problem:   Closed right hip fracture ([place]) Active Problems:   Secondary DM with neurological manifestations ([place])   Diabetic peripheral neuropathy associated with type 2 diabetes mellitus (HCC)   Hyperlipidemia   GERD   Chronic constipation   Systolic murmur   Falls frequently   Pressure injury of skin   Occult blood positive stool   #Right hip and right wrist fracture after mechanical fall -S/p IM intertrochanteric fixation of right hip, ORIF of right wrist 11/21 with Dr. Griffin Basil -Follow up in 10-14 days. PT OT recommending SNF  Further management by ortho  #NSTEMI /Acute systolic and diastolic heart failure -Chest pain free  -EKG  showed anterolateral ST depression -Cardiology consulted. Known moderate AS and post op tachycardia and anemia may cause subendothelial ischemia. Coreg added.  -CXR small left pleural effusion, atelectasis, cardiomegaly  -BNP 3581  -Trop 7.46 --> 9.43 --> 8.9 --> 9 --> 6.87. No heparin due to anemia. Not candidate for cath  -Echo showed EF 20-25%, severe hypokinesis of mid-apical, anteroseptal, inferior, inferoseptal myocardium, grade 1 diastolic dysfunction  -Lasix 40mg  IV BID, low dose coreg -Cardiology following   #Symptomatic acute blood loss anemia -Stable -Baseline Hgb/Hct 13.8/41.9 in August 2018  -Iron, B12 within normal limits, ferritin mildly elevated. Folate pending  -DIC labs, inconsistent with DIC  -LDH elevated 401, no schistocytes on smear  -Transfused 2u pRBC 11/25  -CT abd/pelvis and CT right femur negative for retroperitoneal bleed or hematoma  -FOBT positive. Consulted GI. Patient had EGD and colonoscopy in the past with [facility], Dr. Henrene Pastor. Plan for outpatient colonoscopy +/- EGD in 2-3 months once acute illnesses resolve   #Leukocytosis -Resolving, trending down -?reactive to post-op stress and pain, acute illnesses as above. UA negative. Blood cultures negative. Afebrile.  -?mild cellulitis vs post-op change read on CT right femur, however, no clinical sign of erythema or infection of right thigh. Incision is clean and dressing dry.  -?proctitis on CT abd/pelvis  -Empiric doxycycline started 11/27 -Continue to trend CBC   #Hyponatremia -Resolved    #Hyperkalemia -Resolved   #AKI on CKD stage 3 -Resolved -Avoid nephrotoxin  -Renal US negative for obstruction/hydronephrosis  #DM type 2 -Ha1c 6.6  -Lantus, SSI  #Hypertension  -Hold lisinopril for  soft BP -Continue Coreg   #Abnormal TSH  -TSH low 0.294, Free T4 normal 1.10.  -Repeat TSH/T4 as outpatient once acute illnesses resolve   #Constipation -Resolved   #Sacral and right heel stage 1, not  POA  -Wound RN consulted -Turn every 2 hours, offload pressure    DVT prophylaxis: Lovenox  Code Status: full Family Communication: Spoke to Daughter at bedside Disposition Plan: SNF when stable   Consultants:   Orthopedic surgery  Cardiology   GI   Procedures:   Intramedullary nail intertrochanteric on right 11/21   Open reduction internal fixation write on right 11/21  Antimicrobials:  Anti-infectives (From admission, onward)   Start     Dose/Rate Route Frequency Ordered Stop   03/07/17 1200  doxycycline (VIBRA-TABS) tablet 100 mg     100 mg Oral Every 12 hours 03/07/17 1158     03/01/17 0815  ceFAZolin (ANCEF) IVPB 2g/100 mL premix     2 g 200 mL/hr over 30 Minutes Intravenous On call to O.R. 03/01/17 0757 03/01/17 0845       Subjective: No new complaints, pain well controlled. Denies CP. Admits to shallow breathing. States she feels about the same.    Objective: Vitals:   03/07/17 2348 03/08/17 0352 03/08/17 0859 03/08/17 1058  BP: 106/66 103/66 128/86 (!) 97/54  Pulse: 84 81  93  Resp: (!) 24 18  (!) 25  Temp: 98.9 F (37.2 C) 98.3 F (36.8 C)  97.8 F (36.6 C)  TempSrc: Oral Oral  Oral  SpO2: 96% 96%  96%  Weight:  79 kg (174 lb 2.6 oz)    Height:        Intake/Output Summary (Last 24 hours) at 03/08/2017 1546 Last data filed at 03/08/2017 0829 Gross per 24 hour  Intake 240 ml  Output 900 ml  Net -660 ml   Filed Weights   03/06/17 0419 03/07/17 0542 03/08/17 0352  Weight: 74.5 kg (164 lb 3.9 oz) 77.1 kg (169 lb 15.6 oz) 79 kg (174 lb 2.6 oz)    Examination:  General exam: Appears calm  Respiratory system: Clear to auscultation. Decreased inspiratory effort. Tachypneic rate 20s. On Dendron O2. Cardiovascular system: S1 & S2 heard, tachycardic, regular rhythm. +murmur  Gastrointestinal system: Abdomen is nondistended, soft and nontender. No organomegaly or masses felt. Normal bowel sounds heard. Central nervous system: Alert and oriented. No  focal neurological deficits.  Extremities: +right wrist in dressing/wrap, +right hip dressing clean, +right thigh edema without erythema  Skin: +stage 1 right heel  Psychiatry: Judgement and insight appear normal. Mood & affect appropriate.   Data Reviewed: I have personally reviewed following labs and imaging studies  CBC: Recent Labs  Lab 03/04/17 0805 03/04/17 0953 03/05/17 0425 03/05/17 0808 03/06/17 0157 03/07/17 0139 03/08/17 0314  WBC 23.3*  --  23.5*  --  26.4* 25.6* 22.6*  NEUTROABS  --  15.5*  --   --   --  17.6* 15.8*  HGB 8.0*  --  7.5*  --  9.7* 10.3* 10.5*  HCT 24.8*  --  23.1* CLOT9 29.4* 31.2* 32.4*  MCV 80.5  --  80.2  --  80.1 80.4 83.1  PLT 313  --  340 175 316 299 376   Basic Metabolic Panel: Recent Labs  Lab 03/04/17 0620 03/04/17 0805 03/04/17 0953 03/05/17 0425 03/06/17 0157 03/07/17 0139 03/08/17 0314  NA 129*  --   --  127* 130* 135 133*  K 5.3*  --  5.4* 5.6* 4.3  3.5 3.7  CL 101  --   --  100* 102 101 101  CO2 19*  --   --  18* 17* 24 26  GLUCOSE 211*  --   --  273* 190* 104* 67  BUN 38*  --   --  58* 55* 41* 33*  CREATININE 1.14*  --   --  1.32* 1.13* 0.85 0.76  CALCIUM 8.0*  --   --  7.7* 7.5* 7.4* 7.4*  MG  --  2.1  --   --   --   --  1.9  PHOS  --  2.7  --   --   --   --   --    GFR: Estimated Creatinine Clearance: 51.8 mL/min (by C-G formula based on SCr of 0.76 mg/dL). Liver Function Tests: Recent Labs  Lab 03/02/17 0347  AST 21  ALT 12*  ALKPHOS 68  BILITOT 0.8  PROT 5.6*  ALBUMIN 2.6*   No results for input(s): LIPASE, AMYLASE in the last 168 hours. No results for input(s): AMMONIA in the last 168 hours. Coagulation Profile: Recent Labs  Lab 03/01/17 1555 03/02/17 0347 03/05/17 0808  INR 1.00 1.07 1.18   Cardiac Enzymes: Recent Labs  Lab 03/04/17 1230 03/05/17 1346 03/06/17 0157 03/06/17 0758 03/06/17 1339 03/07/17 0953  CKTOTAL 839*  --   --   --   --   --   TROPONINI  --  7.46* 9.43* 8.90* 9.00* 6.87*    BNP (last 3 results) No results for input(s): PROBNP in the last 8760 hours. HbA1C: No results for input(s): HGBA1C in the last 72 hours. CBG: Recent Labs  Lab 03/07/17 1200 03/07/17 1656 03/07/17 2134 03/08/17 0743 03/08/17 1130  GLUCAP 136* 258* 107* 63* 131*   Lipid Profile: No results for input(s): CHOL, HDL, LDLCALC, TRIG, CHOLHDL, LDLDIRECT in the last 72 hours. Thyroid Function Tests: No results for input(s): TSH, T4TOTAL, FREET4, T3FREE, THYROIDAB in the last 72 hours. Anemia Panel: No results for input(s): VITAMINB12, FOLATE, FERRITIN, TIBC, IRON, RETICCTPCT in the last 72 hours. Sepsis Labs: Recent Labs  Lab 03/05/17 1200 03/06/17 0157 03/06/17 0758  LATICACIDVEN 4.1* 1.4 1.5    Recent Results (from the past 240 hour(s))  Culture, blood (Routine X 2) w Reflex to ID Panel     Status: None   Collection Time: 03/01/17  3:55 PM  Result Value Ref Range Status   Specimen Description BLOOD BLOOD LEFT WRIST  Final   Special Requests   Final    IN PEDIATRIC BOTTLE Blood Culture results may not be optimal due to an excessive volume of blood received in culture bottles   Culture NO GROWTH 5 DAYS  Final   Report Status 03/06/2017 FINAL  Final  Culture, blood (Routine X 2) w Reflex to ID Panel     Status: None   Collection Time: 03/01/17  4:03 PM  Result Value Ref Range Status   Specimen Description BLOOD RIGHT HAND  Final   Special Requests IN PEDIATRIC BOTTLE Blood Culture adequate volume  Final   Culture NO GROWTH 5 DAYS  Final   Report Status 03/06/2017 FINAL  Final  MRSA PCR Screening     Status: None   Collection Time: 03/05/17  6:37 PM  Result Value Ref Range Status   MRSA by PCR NEGATIVE NEGATIVE Final    Comment:        The GeneXpert MRSA Assay (FDA approved for NASAL specimens only), is one component of a comprehensive  MRSA colonization surveillance program. It is not intended to diagnose MRSA infection nor to guide or monitor treatment for MRSA  infections.        Radiology Studies: Ct Abdomen Pelvis Wo Contrast  Result Date: 03/06/2017 CLINICAL DATA:  83 y/o  F; rule out retroperitoneal bleed. EXAM: CT ABDOMEN AND PELVIS WITHOUT CONTRAST TECHNIQUE: Multidetector CT imaging of the abdomen and pelvis was performed following the standard protocol without IV contrast. COMPARISON:  07/11/2007 CT abdomen and pelvis FINDINGS: Lower chest: Small hiatal hernia. Small bilateral pleural effusions. Dependent atelectasis in lower lobes. Severe coronary artery calcification. Aortic valvular calcification associated with aortic stenosis. Hepatobiliary: No focal liver abnormality is seen. No gallstones, gallbladder wall thickening, or biliary dilatation. Pancreas: Unremarkable. No pancreatic ductal dilatation or surrounding inflammatory changes. Spleen: Normal in size without focal abnormality. Adrenals/Urinary Tract: Normal adrenal glands. Right kidney interpolar 21 mm cyst. No hydronephrosis. Collapsed bladder around Foley catheter. Stomach/Bowel: Stomach is within normal limits. Appendix appears normal. Fat stranding within the mesial rectal fat and mild rectal wall thickening. Otherwise no evidence of bowel wall thickening, distention, or inflammatory changes. Vascular/Lymphatic: Aortic atherosclerosis. No enlarged abdominal or pelvic lymph nodes. Reproductive: Status post hysterectomy. No adnexal masses. Other: No abdominal wall hernia or abnormality. No abdominopelvic ascites. Musculoskeletal: Subcutaneous fat stranding within the left lower abdominal wall and flanks compatible with edema, probably third-spacing. Partially visualize comminuted fracture of the right proximal femur extending into proximal diaphysis and intertrochanteric region. Right femoral intramedullary nail and femoral neck screw. IMPRESSION: 1. No retroperitoneal hematoma identified. 2. Mesorectal fat stranding and rectal wall thickening may represent proctitis. 3. Partially visualize  comminuted fracture of right proximal femur involving intertrochanteric region and proximal diaphysis with intramedullary nail fixation. 4. Small hiatal hernia. 5. Small bilateral pleural effusions. 6. Severe coronary and aortic calcific atherosclerosis. 7. Aortic valvular stenosis associated with aortic stenosis. Electronically Signed   By: Kristine Garbe M.D.   On: 03/06/2017 20:58   Ct Femur Right Wo Contrast  Result Date: 03/07/2017 CLINICAL DATA:  Right femur fracture.  Right femur pain. EXAM: CT OF THE LOWER RIGHT EXTREMITY WITHOUT CONTRAST TECHNIQUE: Multidetector CT imaging of the right lower extremity was performed according to the standard protocol. COMPARISON:  None. FINDINGS: Bones/Joint/Cartilage Comminuted right intertrochanteric fracture with subtrochanteric extension transfixed with a intramedullary nail and interlocking nails. No hardware failure or complication. Fracture is in anatomic alignment. No other fracture or dislocation. Joint space narrowing of the right hip. Normal alignment. Small knee joint effusion. Mild medial femorotibial compartment joint space narrowing. Ligaments Ligaments are suboptimally evaluated by CT. Muscles and Tendons Muscles are normal. No muscle atrophy. No intramuscular fluid collection or hematoma. Soft tissue Soft tissue edema in the subcutaneous fat along of the right thigh consistent with postsurgical changes. No fluid collection or hematoma. No soft tissue mass. Peripheral vascular atherosclerotic disease. IMPRESSION: Comminuted right intertrochanteric fracture with subtrochanteric extension transfixed with a intramedullary nail and interlocking nails. No hardware failure or complication. Fracture is in anatomic alignment. Soft tissue edema in the subcutaneous fat along of the right thigh consistent with postsurgical changes versus mild cellulitis. Electronically Signed   By: Kathreen Devoid   On: 03/07/2017 09:13      Scheduled Meds: .  acetaminophen  1,000 mg Oral Q8H  . carvedilol  6.25 mg Oral BID WC  . doxycycline  100 mg Oral Q12H  . enoxaparin (LOVENOX) injection  40 mg Subcutaneous Q24H  . feeding supplement (GLUCERNA SHAKE)  237 mL Oral BID BM  .  furosemide  40 mg Intravenous BID  . insulin aspart  0-15 Units Subcutaneous TID WC  . insulin aspart  0-5 Units Subcutaneous QHS  . insulin aspart  5 Units Subcutaneous TID WC  . insulin glargine  22 Units Subcutaneous QHS  . mouth rinse  15 mL Mouth Rinse BID  . pantoprazole  40 mg Oral Daily  . polyethylene glycol  17 g Oral Daily   Continuous Infusions:    LOS: 7 days    Time spent: 40 minutes   [name] Friendly, MD [facility] [email or web address] Password [facility] 03/08/2017, 3:46 PM

## 2017-03-08 NOTE — Social Work (Signed)
CSW met with patient and daughter at bedside, preference still [name], informed pt and daughter that when pt is medically stable we will arrange transportation and placement. [name] is aware of patient preference and ready whenever pt is medically stable.    Alexander Mt, [place] Work [phone]

## 2017-03-08 NOTE — Progress Notes (Signed)
Occupational Therapy Treatment Patient Details Name: [name] MRN: [id number] DOB: Nov 07, 1929 Today's Date: 03/08/2017    History of present illness [name] a 83 y.o.femalewith medical history significantfor diabetes, hypertension, hyperlipidemia, anxiety, GERD, hypothyroidism, presenting after sustaining a mechanical fall onto her right hip. S/p IM intertrochanteric fixation of right hip, ORIF of right wrist 11/21.   OT comments  Pt demonstrating some progress toward OT goals. She continues to demonstrate significant pain with mobility this session. Able to sit at EOB for ADL participation with min-mod assist for balance. She requires min assist for self-feeding tasks and min guard assist for grooming. Pt hesitant to participate in self-feeding with her non-dominant hand but educated pt on importance of increasing participation and independence with ADL. Continue to recommend SNF level rehabilitation post-acute D/C and will continue to follow while admitted.    Follow Up Recommendations  SNF;Supervision/Assistance - 24 hour    Equipment Recommendations  Other (comment)(defer to next venue of care)    Recommendations for Other Services      Precautions / Restrictions Precautions Precautions: Fall Restrictions Weight Bearing Restrictions: Yes RUE Weight Bearing: Weight bear through elbow only RLE Weight Bearing: Weight bearing as tolerated Other Position/Activity Restrictions: no ROM at R wrist       Mobility Bed Mobility Overal bed mobility: Needs Assistance Bed Mobility: Supine to Sit;Sit to Supine;Rolling Rolling: Max assist   Supine to sit: Max assist;+2 for physical assistance;+2 for safety/equipment;HOB elevated Sit to supine: Total assist;+2 for physical assistance;+2 for safety/equipment;HOB elevated   General bed mobility comments: Pt with limited ability to press herself up to seated position from [name]  transfer comment: Not attempted this session.     Balance Overall balance assessment: Needs assistance Sitting-balance support: Single extremity supported;Feet supported;No upper extremity supported Sitting balance-[name] Scale: Poor Sitting balance - Comments: Min-mod assist for sitting balance at EOB                                   ADL either performed or assessed with clinical judgement   ADL Overall ADL's : Needs assistance/impaired Eating/Feeding: Minimal assistance;Sitting Eating/Feeding Details (indicate cue type and reason): Pt has had her daughter feeding her and educated on recommendation to start completing this more independently Grooming: Min guard;Sitting Grooming Details (indicate cue type and reason): Requiring max encouragement to complete on her own and requesting OT do this for her. Min-mod assist for balance seated at EOB                               General ADL Comments: Session focused on ADL seated at EOB. Pt requiring encouragement to participate.      Vision   Vision Assessment?: No apparent visual deficits   Perception     Praxis      Cognition Arousal/Alertness: Awake/alert Behavior During Therapy: Anxious Overall Cognitive Status: Within Functional Limits for tasks assessed                                          Exercises Exercises: Other exercises Other Exercises Other Exercises: Encouraged ROM at digits for edema management.    Shoulder Instructions  General Comments R digits lodged in bedrail on return to supine requiring assistance and significant extension to remove. No pain once removed and AROM WFL. Will monitor.     Pertinent Vitals/ Pain       Pain Assessment: Faces Faces Pain Scale: Hurts even more Pain Location: R leg and wrist Pain Descriptors / Indicators: Discomfort;Guarding;Grimacing;Moaning;Tender Pain Intervention(s): Limited activity within patient's  tolerance;Monitored during session;Repositioned  Home Living                                          Prior Functioning/Environment              Frequency  Min 2X/week        Progress Toward Goals  OT Goals(current goals can now be found in the care plan section)  Progress towards OT goals: Progressing toward goals  Acute Rehab OT Goals Patient Stated Goal: to decrease pain OT Goal Formulation: With patient Time For Goal Achievement: 03/16/17 Potential to Achieve Goals: [place] Discharge plan remains appropriate    Co-evaluation                 AM-PAC PT "6 Clicks" Daily Activity     Outcome Measure   Help from another person eating meals?: A Little Help from another person taking care of personal grooming?: A Little Help from another person toileting, which includes using toliet, bedpan, or urinal?: Total Help from another person bathing (including washing, rinsing, drying)?: Total Help from another person to put on and taking off regular upper body clothing?: A Lot Help from another person to put on and taking off regular lower body clothing?: Total 6 Click Score: 11    End of Session Equipment Utilized During Treatment: Oxygen  OT Visit Diagnosis: Unsteadiness on feet (R26.81);Other abnormalities of gait and mobility (R26.89);Repeated falls (R29.6);History of falling (Z91.81);Muscle weakness (generalized) (M62.81);Pain Pain - Right/Left: Right Pain - part of body: Arm;Hand;Leg   Activity Tolerance Patient limited by pain   Patient Left in bed;with call bell/phone within reach;with bed alarm set;with SCD's reapplied   Nurse Communication Mobility status        Time: 5726-2035 OT Time Calculation (min): 24 min  Charges: OT General Charges $OT Visit: 1 Visit OT Treatments $Self Care/Home Management : 23-37 mins  [name], MS OTR/L  Pager: [name] 03/08/2017, 4:09 PM

## 2017-03-08 NOTE — Progress Notes (Signed)
Progress Note  Patient Name: [name] Date of Encounter: 03/08/2017  Primary Cardiologist: Burt Knack   Subjective   83 year old female admitted with a right hip fracture. She has a history of coronary artery disease status post recent non-ST segment elevation myocardial infarction. She has moderate aortic insufficiency and moderate pulmonary hypertension. She has a history of diabetes mellitus hyperlipidemia.  Inpatient Medications    Scheduled Meds: . acetaminophen  1,000 mg Oral Q8H  . carvedilol  3.125 mg Oral BID WC  . doxycycline  100 mg Oral Q12H  . enoxaparin (LOVENOX) injection  40 mg Subcutaneous Q24H  . feeding supplement (GLUCERNA SHAKE)  237 mL Oral BID BM  . furosemide  40 mg Intravenous BID  . insulin aspart  0-15 Units Subcutaneous TID WC  . insulin aspart  0-5 Units Subcutaneous QHS  . insulin aspart  5 Units Subcutaneous TID WC  . insulin glargine  22 Units Subcutaneous QHS  . mouth rinse  15 mL Mouth Rinse BID  . pantoprazole  40 mg Oral Daily  . polyethylene glycol  17 g Oral Daily   Continuous Infusions:  PRN Meds: bisacodyl, ondansetron, oxyCODONE, senna-docusate   Vital Signs    Vitals:   03/07/17 1955 03/07/17 2348 03/08/17 0352 03/08/17 0859  BP: 95/60 106/66 103/66 128/86  Pulse: 82 84 81   Resp: 20 (!) 24 18   Temp: 98.6 F (37 C) 98.9 F (37.2 C) 98.3 F (36.8 C)   TempSrc: Oral Oral Oral   SpO2: 98% 96% 96%   Weight:   174 lb 2.6 oz (79 kg)   Height:        Intake/Output Summary (Last 24 hours) at 03/08/2017 1010 Last data filed at 03/08/2017 0829 Gross per 24 hour  Intake 240 ml  Output 1850 ml  Net -1610 ml   Filed Weights   03/06/17 0419 03/07/17 0542 03/08/17 0352  Weight: 164 lb 3.9 oz (74.5 kg) 169 lb 15.6 oz (77.1 kg) 174 lb 2.6 oz (79 kg)    Telemetry    NSR at 92  - Personally Reviewed  ECG     NSR ,  - Personally Reviewed  Physical Exam   GEN:  elderly, frail female. She is in no acute distress. She  appears to be a little bit more comfortable than she was over the past several days. Neck: No JVD Cardiac: RRR, soft systolic murmur Respiratory: Clear to auscultation bilaterally. GI: Soft, nontender, non-distended  MS: No edema; No deformity. Neuro:  Nonfocal  Psych: Normal affect   Labs    Chemistry Recent Labs  Lab 03/02/17 0347  03/06/17 0157 03/07/17 0139 03/08/17 0314  NA 136   < > 130* 135 133*  K 5.2*   < > 4.3 3.5 3.7  CL 106   < > 102 101 101  CO2 21*   < > 17* 24 26  GLUCOSE 213*   < > 190* 104* 67  BUN 25*   < > 55* 41* 33*  CREATININE 1.05*   < > 1.13* 0.85 0.76  CALCIUM 7.7*   < > 7.5* 7.4* 7.4*  PROT 5.6*  --   --   --   --   ALBUMIN 2.6*  --   --   --   --   AST 21  --   --   --   --   ALT 12*  --   --   --   --   ALKPHOS 68  --   --   --   --  BILITOT 0.8  --   --   --   --   GFRNONAA 47*   < > 43* >60 >60  GFRAA 55*   < > 50* >60 >60  ANIONGAP 9   < > 11 10 6    < > = values in this interval not displayed.     Hematology Recent Labs  Lab 03/06/17 0157 03/07/17 0139 03/08/17 0314  WBC 26.4* 25.6* 22.6*  RBC 3.67* 3.88 3.90  HGB 9.7* 10.3* 10.5*  HCT 29.4* 31.2* 32.4*  MCV 80.1 80.4 83.1  MCH 26.4 26.5 26.9  MCHC 33.0 33.0 32.4  RDW 15.7* 16.0* 16.6*  PLT 316 299 282    Cardiac Enzymes Recent Labs  Lab 03/06/17 0157 03/06/17 0758 03/06/17 1339 03/07/17 0953  TROPONINI 9.43* 8.90* 9.00* 6.87*   No results for input(s): TROPIPOC in the last 168 hours.   BNP Recent Labs  Lab 03/05/17 1346  BNP 3,581.4*     DDimer  Recent Labs  Lab 03/05/17 0808  DDIMER 2.17*     Radiology    Ct Abdomen Pelvis Wo Contrast  Result Date: 03/06/2017 CLINICAL DATA:  83 y/o  F; rule out retroperitoneal bleed. EXAM: CT ABDOMEN AND PELVIS WITHOUT CONTRAST TECHNIQUE: Multidetector CT imaging of the abdomen and pelvis was performed following the standard protocol without IV contrast. COMPARISON:  07/11/2007 CT abdomen and pelvis FINDINGS: Lower  chest: Small hiatal hernia. Small bilateral pleural effusions. Dependent atelectasis in lower lobes. Severe coronary artery calcification. Aortic valvular calcification associated with aortic stenosis. Hepatobiliary: No focal liver abnormality is seen. No gallstones, gallbladder wall thickening, or biliary dilatation. Pancreas: Unremarkable. No pancreatic ductal dilatation or surrounding inflammatory changes. Spleen: Normal in size without focal abnormality. Adrenals/Urinary Tract: Normal adrenal glands. Right kidney interpolar 21 mm cyst. No hydronephrosis. Collapsed bladder around Foley catheter. Stomach/Bowel: Stomach is within normal limits. Appendix appears normal. Fat stranding within the mesial rectal fat and mild rectal wall thickening. Otherwise no evidence of bowel wall thickening, distention, or inflammatory changes. Vascular/Lymphatic: Aortic atherosclerosis. No enlarged abdominal or pelvic lymph nodes. Reproductive: Status post hysterectomy. No adnexal masses. Other: No abdominal wall hernia or abnormality. No abdominopelvic ascites. Musculoskeletal: Subcutaneous fat stranding within the left lower abdominal wall and flanks compatible with edema, probably third-spacing. Partially visualize comminuted fracture of the right proximal femur extending into proximal diaphysis and intertrochanteric region. Right femoral intramedullary nail and femoral neck screw. IMPRESSION: 1. No retroperitoneal hematoma identified. 2. Mesorectal fat stranding and rectal wall thickening may represent proctitis. 3. Partially visualize comminuted fracture of right proximal femur involving intertrochanteric region and proximal diaphysis with intramedullary nail fixation. 4. Small hiatal hernia. 5. Small bilateral pleural effusions. 6. Severe coronary and aortic calcific atherosclerosis. 7. Aortic valvular stenosis associated with aortic stenosis. Electronically Signed   By: Kristine Garbe M.D.   On: 03/06/2017 20:58    Ct Femur Right Wo Contrast  Result Date: 03/07/2017 CLINICAL DATA:  Right femur fracture.  Right femur pain. EXAM: CT OF THE LOWER RIGHT EXTREMITY WITHOUT CONTRAST TECHNIQUE: Multidetector CT imaging of the right lower extremity was performed according to the standard protocol. COMPARISON:  None. FINDINGS: Bones/Joint/Cartilage Comminuted right intertrochanteric fracture with subtrochanteric extension transfixed with a intramedullary nail and interlocking nails. No hardware failure or complication. Fracture is in anatomic alignment. No other fracture or dislocation. Joint space narrowing of the right hip. Normal alignment. Small knee joint effusion. Mild medial femorotibial compartment joint space narrowing. Ligaments Ligaments are suboptimally evaluated by CT. Muscles and Tendons  Muscles are normal. No muscle atrophy. No intramuscular fluid collection or hematoma. Soft tissue Soft tissue edema in the subcutaneous fat along of the right thigh consistent with postsurgical changes. No fluid collection or hematoma. No soft tissue mass. Peripheral vascular atherosclerotic disease. IMPRESSION: Comminuted right intertrochanteric fracture with subtrochanteric extension transfixed with a intramedullary nail and interlocking nails. No hardware failure or complication. Fracture is in anatomic alignment. Soft tissue edema in the subcutaneous fat along of the right thigh consistent with postsurgical changes versus mild cellulitis. Electronically Signed   By: Kathreen Devoid   On: 03/07/2017 09:13    Cardiac Studies     Patient Profile     83 y.o. female admitted following a right hip fracture. She had a perioperative myocardial infarction. Now has a severely reduced left lithotripter systolic function with EF of 20 - 25%.  Assessment & Plan    1. Coronary artery disease: She is status post non-ST segment elevation myocardial infarction. She's not a candidate for Given her multiple comorbid problems.. Continue  with conservative therapy.  2. Acute combined systolic and diastolic congestive heart failure: Due to her recent myocardial infarction. Continue with medical therapy. Continue IV Lasix and low-dose Coreg.  Will  increase the carvedilol to 6.25 mg twice a day.  For questions or updates, please contact [place] Please consult [email or web address] for contact info under Cardiology/STEMI.      Signed, Mertie Moores, MD  03/08/2017, 10:10 AM

## 2017-03-08 NOTE — Progress Notes (Signed)
Inpatient Diabetes Program Recommendations  AACE/ADA: New Consensus Statement on Inpatient Glycemic Control (2015)  Target Ranges:  Prepandial:   less than 140 mg/dL      Peak postprandial:   less than 180 mg/dL (1-2 hours)      Critically ill patients:  140 - 180 mg/dL   Results for [name], [name] (MRN [id number]) as of 03/08/2017 08:27  Ref. Range 03/07/2017 07:39 03/07/2017 12:00 03/07/2017 16:56 03/07/2017 21:34 03/08/2017 07:43  Glucose-Capillary Latest Ref Range: 65 - 99 mg/dL 165 (H) 136 (H) 258 (H) 107 (H) 63 (L)   Review of Glycemic Control  Diabetes history: DM2 Outpatient Diabetes medications: NPH 15 units QHS, Novolog 10 units TID with meals Current orders for Inpatient glycemic control: Lantus 22 units QHS, Novolog 0-15 units TID with meals, Novolog 0-5 units QHS, Novolog 5 units TID with meals  Inpatient Diabetes Program Recommendations: Insulin - Basal: Fasting glucose 63 mg/dl this morning. Please consider decreasing Lantus to 20 units QHS.  Thanks, Barnie Alderman, RN, MSN, CDE Diabetes Coordinator Inpatient Diabetes Program [phone] (Team Pager from 8am to 5pm)

## 2017-03-08 NOTE — Plan of Care (Signed)
Foley removed to decrease risk for infection, pt repositioned q2h,

## 2017-03-08 NOTE — Social Work (Signed)
Insurance authorization initiated with [employer].   CSW will continue to follow and support patient/family with discharge.   Alexander Mt, [place] Work [phone]

## 2017-03-09 LAB — CBC WITH DIFFERENTIAL/PLATELET
Basophils Absolute: 0 10*3/uL (ref 0.0–0.1)
Basophils Relative: 0 %
EOS ABS: 0.7 10*3/uL (ref 0.0–0.7)
EOS PCT: 4 %
HCT: 31.7 % — ABNORMAL LOW (ref 36.0–46.0)
Hemoglobin: 10 g/dL — ABNORMAL LOW (ref 12.0–15.0)
LYMPHS ABS: 4.1 10*3/uL — AB (ref 0.7–4.0)
Lymphocytes Relative: 21 %
MCH: 26.4 pg (ref 26.0–34.0)
MCHC: 31.5 g/dL (ref 30.0–36.0)
MCV: 83.6 fL (ref 78.0–100.0)
MONO ABS: 2 10*3/uL — AB (ref 0.1–1.0)
MONOS PCT: 10 %
Neutro Abs: 12.7 10*3/uL — ABNORMAL HIGH (ref 1.7–7.7)
Neutrophils Relative %: 65 %
PLATELETS: 298 10*3/uL (ref 150–400)
RBC: 3.79 MIL/uL — ABNORMAL LOW (ref 3.87–5.11)
RDW: 17.3 % — AB (ref 11.5–15.5)
WBC: 19.5 10*3/uL — ABNORMAL HIGH (ref 4.0–10.5)

## 2017-03-09 LAB — GLUCOSE, CAPILLARY
GLUCOSE-CAPILLARY: 104 mg/dL — AB (ref 65–99)
Glucose-Capillary: 167 mg/dL — ABNORMAL HIGH (ref 65–99)
Glucose-Capillary: 183 mg/dL — ABNORMAL HIGH (ref 65–99)
Glucose-Capillary: 119 mg/dL — ABNORMAL HIGH (ref 65–99)

## 2017-03-09 LAB — BASIC METABOLIC PANEL
Anion gap: 9 (ref 5–15)
BUN: 30 mg/dL — AB (ref 6–20)
CHLORIDE: 97 mmol/L — AB (ref 101–111)
CO2: 27 mmol/L (ref 22–32)
CREATININE: 0.77 mg/dL (ref 0.44–1.00)
Calcium: 7.4 mg/dL — ABNORMAL LOW (ref 8.9–10.3)
GFR calc Af Amer: >60 mL/min (ref >60)
GFR calc non Af Amer: >60 mL/min (ref >60)
Glucose, Bld: 97 mg/dL (ref 65–99)
Potassium: 3.9 mmol/L (ref 3.5–5.1)
Sodium: 133 mmol/L — ABNORMAL LOW (ref 135–145)

## 2017-03-09 LAB — ALDOSTERONE + RENIN ACTIVITY W/ RATIO
ALDO / PRA RATIO: 0.2 (ref 0.0–30.0)
ALDOSTERONE: 10.4 ng/dL (ref 0.0–30.0)
PRA LC/MS/MS: 63.327 ng/mL/hr — ABNORMAL HIGH (ref 0.167–5.380)

## 2017-03-09 MED ORDER — LOSARTAN POTASSIUM 50 MG PO TABS
25.0000 mg | ORAL_TABLET | Freq: Every day | ORAL | Status: DC
  Administered 2017-03-09: 25 mg via ORAL
  Filled 2017-03-09: qty 1

## 2017-03-09 NOTE — Progress Notes (Addendum)
Progress Note  Patient Name: [name] Date of Encounter: 03/09/2017  Primary Cardiologist: Burt Knack  Subjective   83 year old female admitted with right hip fracture.  She has a history of CAD-status post recent non-ST segment elevation myocardial infarction.  This is resulted in ischemic cardiomyopathy with an EF of 20-25%.  She has moderate aortic insufficiency and moderate pulmonary hypertension.  Been treated medically.  She was not a candidate for cath because of a GI bleed and her other comorbid problems.  She is gradually feeling better on medical therapy.  We have successfully diuresed her 4.6 L during this admission.  Inpatient Medications    Scheduled Meds: . acetaminophen  1,000 mg Oral Q8H  . carvedilol  6.25 mg Oral BID WC  . doxycycline  100 mg Oral Q12H  . enoxaparin (LOVENOX) injection  40 mg Subcutaneous Q24H  . feeding supplement (GLUCERNA SHAKE)  237 mL Oral BID BM  . furosemide  40 mg Intravenous BID  . insulin aspart  0-15 Units Subcutaneous TID WC  . insulin aspart  0-5 Units Subcutaneous QHS  . insulin aspart  5 Units Subcutaneous TID WC  . insulin glargine  20 Units Subcutaneous QHS  . mouth rinse  15 mL Mouth Rinse BID  . pantoprazole  40 mg Oral Daily  . polyethylene glycol  17 g Oral Daily   Continuous Infusions:  PRN Meds: bisacodyl, Gerhardt's butt cream, ondansetron, oxyCODONE, senna-docusate   Vital Signs    Vitals:   03/09/17 0000 03/09/17 0016 03/09/17 0410 03/09/17 0810  BP:  102/67 121/69 120/75  Pulse: 71 73 72 81  Resp: 18 (!) 25 19 (!) 23  Temp:  98.3 F (36.8 C) (!) 97.5 F (36.4 C) (!) 97.4 F (36.3 C)  TempSrc:  Oral Oral Oral  SpO2: 98% 97% 98% 98%  Weight:      Height:        Intake/Output Summary (Last 24 hours) at 03/09/2017 0900 Last data filed at 03/09/2017 0603 Gross per 24 hour  Intake -  Output 500 ml  Net -500 ml   Filed Weights   03/06/17 0419 03/07/17 0542 03/08/17 0352  Weight: 164 lb 3.9 oz  (74.5 kg) 169 lb 15.6 oz (77.1 kg) 174 lb 2.6 oz (79 kg)    Telemetry    Normal sinus rhythm- Personally Reviewed  ECG    Normal sinus rhythm- Personally Reviewed  Physical Exam   GEN:  Elderly female.  She is quite weak.  No acute distress.   Neck: No JVD Cardiac: RRR, soft systolic murmur.  Soft diastolic murmur respiratory: Clear to auscultation bilaterally. GI: Soft, nontender, non-distended  MS: No edema; No deformity. Neuro:  Nonfocal  Psych: Normal affect   Labs    Chemistry Recent Labs  Lab 03/07/17 0139 03/08/17 0314 03/09/17 0334  NA 135 133* 133*  K 3.5 3.7 3.9  CL 101 101 97*  CO2 24 26 27   GLUCOSE 104* 67 97  BUN 41* 33* 30*  CREATININE 0.85 0.76 0.77  CALCIUM 7.4* 7.4* 7.4*  GFRNONAA >60 >60 >60  GFRAA >60 >60 >60  ANIONGAP 10 6 9      Hematology Recent Labs  Lab 03/07/17 0139 03/08/17 0314 03/09/17 0334  WBC 25.6* 22.6* 19.5*  RBC 3.88 3.90 3.79*  HGB 10.3* 10.5* 10.0*  HCT 31.2* 32.4* 31.7*  MCV 80.4 83.1 83.6  MCH 26.5 26.9 26.4  MCHC 33.0 32.4 31.5  RDW 16.0* 16.6* 17.3*  PLT 299 282 298  Cardiac Enzymes Recent Labs  Lab 03/06/17 0157 03/06/17 0758 03/06/17 1339 03/07/17 0953  TROPONINI 9.43* 8.90* 9.00* 6.87*   No results for input(s): TROPIPOC in the last 168 hours.   BNP Recent Labs  Lab 03/05/17 1346  BNP 3,581.4*     DDimer  Recent Labs  Lab 03/05/17 0808  DDIMER 2.17*     Radiology    No results found.  Cardiac Studies     Patient Profile     83 y.o. female with non-ST segment elevation myocardial infarction following hip surgery.  Her postoperative course was also complicated by anemia, GI bleed.  Assessment & Plan    1.  Non-ST segment elevation myocardial infarction.  She seems to be doing better on medical therapy.  We have started her on Coreg and Lasix.  He is diuresed nicely and is breathing much better.  2.  Aortic stenosis/aortic insufficiency: Stable  3.  Pulmonary hypertension: She  has moderate pulmonary hypertension with an estimated PA pressure of 50.  It seems to be stable and is improving with diuresis.  4.  Acute combined systolic and diastolic congestive heart failure: She is better with diuresis.  Continue carvedilol.  Add losartan 25 mg a day.  5.  Leukocytosis: The patient's white blood cell count is 19.5.  Further management is per the internal medicine team.  For questions or updates, please contact [place] Please consult [email or web address] for contact info under Cardiology/STEMI.      Signed, Mertie Moores, MD  03/09/2017, 9:00 AM

## 2017-03-09 NOTE — Progress Notes (Signed)
Pt arrived to unit on low bed. Identified appropriately and assessed. Skin intact with some bruising apparent. Placed on telemetry and oriented to unit. Pt c/o pain 5/10 and given pain medicine. Resting at this time. Will continue to monitor.

## 2017-03-09 NOTE — Progress Notes (Signed)
PROGRESS NOTE    [name]  ELF:[id number] DOB: [birth year] DOA: 03/01/2017 PCP: Jinny Sanders, MD     Brief Narrative:  [name] is a 83 y.o. female with medical history significant for diabetes, hypertension, hyperlipidemia, anxiety, GERD, hypothyroidism, presenting after sustaining a mechanical fall onto her right hip when she was reaching over to the commode for the lid. She had right wrist pain and some swelling.  Patient reports trying to go to the bathroom, when she lost balance, with no loss of consciousness, or hitting her head.  She denies any dizziness, presyncope or syncope.  The patient denies any history of seizures.  She denies any chest pain, palpitations, or shortness of breath or cough.  She denies any abdominal pain, nausea or vomiting.  Denies any focal weakness, numbness or tingling.  The patient was immobilized, and a splint was placed on her right wrist as well.  Orthopedic surgery was consulted and patient underwent IM intertrochanteric fixation right hip, ORIF of right wrist 11/21 with Dr. Griffin Basil.  Hospitalization has been complicated by dyspnea, NSTEMI, anemia.  Today, pt denies any new complaints, reports being tired, but overall improving. Denies any chest pain, SOB, abdominal pain, N/V/D/C, fever/chills.   Assessment & Plan:   Principal Problem:   Closed right hip fracture ([place]) Active Problems:   Secondary DM with neurological manifestations ([place])   Diabetic peripheral neuropathy associated with type 2 diabetes mellitus (HCC)   Hyperlipidemia   GERD   Chronic constipation   Systolic murmur   Falls frequently   Pressure injury of skin   Occult blood positive stool   #Right hip and right wrist fracture after mechanical fall -S/p IM intertrochanteric fixation of right hip, ORIF of right wrist 11/21 with Dr. Griffin Basil -Follow up in 10-14 days. PT OT recommending SNF  Further management by ortho  #NSTEMI /Acute systolic and diastolic heart  failure -Chest pain free  -EKG showed anterolateral ST depression -Cardiology consulted. Known moderate AS and post op tachycardia and anemia may cause subendothelial ischemia. Coreg added.  -CXR small left pleural effusion, atelectasis, cardiomegaly  -BNP 3581  -Trop 7.46 --> 9.43 --> 8.9 --> 9 --> 6.87. No heparin due to anemia. Not candidate for cath  -Echo showed EF 20-25%, severe hypokinesis of mid-apical, anteroseptal, inferior, inferoseptal myocardium, grade 1 diastolic dysfunction  -Lasix 40mg  IV BID, low dose coreg, losartan -Cardiology following   #Symptomatic acute blood loss anemia -Stable -Baseline Hgb/Hct 13.8/41.9 in August 2018  -Iron, B12 within normal limits, ferritin mildly elevated. Folate pending  -DIC labs, inconsistent with DIC  -LDH elevated 401, no schistocytes on smear  -Transfused 2u pRBC 11/25  -CT abd/pelvis and CT right femur negative for retroperitoneal bleed or hematoma  -FOBT positive. Consulted GI. Patient had EGD and colonoscopy in the past with [facility], Dr. Henrene Pastor. Plan for outpatient colonoscopy +/- EGD in 2-3 months once acute illnesses resolve   #Leukocytosis -Resolving, trending down slowly -?reactive to post-op stress and pain, acute illnesses as above. UA negative. Blood cultures negative. Afebrile.  -?mild cellulitis vs post-op change read on CT right femur, however, no clinical sign of erythema or infection of right thigh. Incision is clean and dressing dry.  -?proctitis on CT abd/pelvis  -Empiric doxycycline started 11/27 -Continue to trend CBC   #Hyponatremia -Resolved    #Hyperkalemia -Resolved   #AKI on CKD stage 3 -Resolved -Avoid nephrotoxin  -Renal US negative for obstruction/hydronephrosis  #DM type 2 -Ha1c 6.6  -Lantus, SSI  #  Hypertension  -Continue Coreg, started losartan  #Abnormal TSH  -TSH low 0.294, Free T4 normal 1.10.  -Repeat TSH/T4 as outpatient once acute illnesses resolve   #Constipation -Resolved    #Sacral and right heel stage 1, not POA  -Wound RN consulted -Turn every 2 hours, offload pressure    DVT prophylaxis: Lovenox  Code Status: full Family Communication None at bedside Disposition Plan: SNF when stable   Consultants:   Orthopedic surgery  Cardiology   GI   Procedures:   Intramedullary nail intertrochanteric on right 11/21   Open reduction internal fixation write on right 11/21  Antimicrobials:  Anti-infectives (From admission, onward)   Start     Dose/Rate Route Frequency Ordered Stop   03/07/17 1200  doxycycline (VIBRA-TABS) tablet 100 mg     100 mg Oral Every 12 hours 03/07/17 1158     03/01/17 0815  ceFAZolin (ANCEF) IVPB 2g/100 mL premix     2 g 200 mL/hr over 30 Minutes Intravenous On call to O.R. 03/01/17 0757 03/01/17 0845       Objective: Vitals:   03/09/17 0810 03/09/17 1231 03/09/17 1305 03/09/17 1619  BP: 120/75  (!) 91/57 96/64  Pulse: 81 69 71 73  Resp: (!) 23 19 (!) 21 20  Temp: (!) 97.4 F (36.3 C) (!) 97.5 F (36.4 C)  97.8 F (36.6 C)  TempSrc: Oral Oral  Oral  SpO2: 98% 100% 91% 99%  Weight:      Height:        Intake/Output Summary (Last 24 hours) at 03/09/2017 1909 Last data filed at 03/09/2017 1800 Gross per 24 hour  Intake 744 ml  Output 850 ml  Net -106 ml   Filed Weights   03/06/17 0419 03/07/17 0542 03/08/17 0352  Weight: 74.5 kg (164 lb 3.9 oz) 77.1 kg (169 lb 15.6 oz) 79 kg (174 lb 2.6 oz)    Examination:  General exam: Appears calm  Respiratory system: Clear to auscultation. On Stacy O2. Cardiovascular system: S1 & S2 heard, tachycardic, regular rhythm. +murmur  Gastrointestinal system: Abdomen is nondistended, soft and nontender. No organomegaly or masses felt. Normal bowel sounds heard. Central nervous system: Alert and oriented. No focal neurological deficits.  Extremities: +right wrist in dressing/wrap, +right hip dressing clean, +right thigh without erythema  Skin: +stage 1 right heel   Psychiatry: Judgement and insight appear normal. Mood & affect appropriate.   Data Reviewed: I have personally reviewed following labs and imaging studies  CBC: Recent Labs  Lab 03/04/17 0953 03/05/17 0425 03/05/17 0808 03/06/17 0157 03/07/17 0139 03/08/17 0314 03/09/17 0334  WBC  --  23.5*  --  26.4* 25.6* 22.6* 19.5*  NEUTROABS 15.5*  --   --   --  17.6* 15.8* 12.7*  HGB  --  7.5*  --  9.7* 10.3* 10.5* 10.0*  HCT  --  23.1* CLOT9 29.4* 31.2* 32.4* 31.7*  MCV  --  80.2  --  80.1 80.4 83.1 83.6  PLT  --  340 175 316 299 282 185   Basic Metabolic Panel: Recent Labs  Lab 03/04/17 0805  03/05/17 0425 03/06/17 0157 03/07/17 0139 03/08/17 0314 03/09/17 0334  NA  --   --  127* 130* 135 133* 133*  K  --    < > 5.6* 4.3 3.5 3.7 3.9  CL  --   --  100* 102 101 101 97*  CO2  --   --  18* 17* 24 26 27   GLUCOSE  --   --  273* 190* 104* 67 97  BUN  --   --  58* 55* 41* 33* 30*  CREATININE  --   --  1.32* 1.13* 0.85 0.76 0.77  CALCIUM  --   --  7.7* 7.5* 7.4* 7.4* 7.4*  MG 2.1  --   --   --   --  1.9  --   PHOS 2.7  --   --   --   --   --   --    < > = values in this interval not displayed.   GFR: Estimated Creatinine Clearance: 51.8 mL/min (by C-G formula based on SCr of 0.77 mg/dL). Liver Function Tests: No results for input(s): AST, ALT, ALKPHOS, BILITOT, PROT, ALBUMIN in the last 168 hours. No results for input(s): LIPASE, AMYLASE in the last 168 hours. No results for input(s): AMMONIA in the last 168 hours. Coagulation Profile: Recent Labs  Lab 03/05/17 0808  INR 1.18   Cardiac Enzymes: Recent Labs  Lab 03/04/17 1230 03/05/17 1346 03/06/17 0157 03/06/17 0758 03/06/17 1339 03/07/17 0953  CKTOTAL 839*  --   --   --   --   --   TROPONINI  --  7.46* 9.43* 8.90* 9.00* 6.87*   BNP (last 3 results) No results for input(s): PROBNP in the last 8760 hours. HbA1C: No results for input(s): HGBA1C in the last 72 hours. CBG: Recent Labs  Lab 03/08/17 1729  03/08/17 2205 03/09/17 0814 03/09/17 1256 03/09/17 1605  GLUCAP 130* 124* 104* 167* 183*   Lipid Profile: No results for input(s): CHOL, HDL, LDLCALC, TRIG, CHOLHDL, LDLDIRECT in the last 72 hours. Thyroid Function Tests: No results for input(s): TSH, T4TOTAL, FREET4, T3FREE, THYROIDAB in the last 72 hours. Anemia Panel: No results for input(s): VITAMINB12, FOLATE, FERRITIN, TIBC, IRON, RETICCTPCT in the last 72 hours. Sepsis Labs: Recent Labs  Lab 03/05/17 1200 03/06/17 0157 03/06/17 0758  LATICACIDVEN 4.1* 1.4 1.5    Recent Results (from the past 240 hour(s))  Culture, blood (Routine X 2) w Reflex to ID Panel     Status: None   Collection Time: 03/01/17  3:55 PM  Result Value Ref Range Status   Specimen Description BLOOD BLOOD LEFT WRIST  Final   Special Requests   Final    IN PEDIATRIC BOTTLE Blood Culture results may not be optimal due to an excessive volume of blood received in culture bottles   Culture NO GROWTH 5 DAYS  Final   Report Status 03/06/2017 FINAL  Final  Culture, blood (Routine X 2) w Reflex to ID Panel     Status: None   Collection Time: 03/01/17  4:03 PM  Result Value Ref Range Status   Specimen Description BLOOD RIGHT HAND  Final   Special Requests IN PEDIATRIC BOTTLE Blood Culture adequate volume  Final   Culture NO GROWTH 5 DAYS  Final   Report Status 03/06/2017 FINAL  Final  MRSA PCR Screening     Status: None   Collection Time: 03/05/17  6:37 PM  Result Value Ref Range Status   MRSA by PCR NEGATIVE NEGATIVE Final    Comment:        The GeneXpert MRSA Assay (FDA approved for NASAL specimens only), is one component of a comprehensive MRSA colonization surveillance program. It is not intended to diagnose MRSA infection nor to guide or monitor treatment for MRSA infections.        Radiology Studies: No results found.    Scheduled Meds: . acetaminophen  1,000  mg Oral Q8H  . carvedilol  6.25 mg Oral BID WC  . doxycycline  100 mg Oral  Q12H  . enoxaparin (LOVENOX) injection  40 mg Subcutaneous Q24H  . feeding supplement (GLUCERNA SHAKE)  237 mL Oral BID BM  . furosemide  40 mg Intravenous BID  . insulin aspart  0-15 Units Subcutaneous TID WC  . insulin aspart  0-5 Units Subcutaneous QHS  . insulin aspart  5 Units Subcutaneous TID WC  . insulin glargine  20 Units Subcutaneous QHS  . losartan  25 mg Oral Daily  . mouth rinse  15 mL Mouth Rinse BID  . pantoprazole  40 mg Oral Daily  . polyethylene glycol  17 g Oral Daily   Continuous Infusions:    LOS: 8 days    Time spent: 40 minutes   Alma Friendly, MD [facility] [email or web address] Password [email or web address] 03/09/2017, 7:09 PM

## 2017-03-09 NOTE — Progress Notes (Signed)
Report was called to receiving nurse on 5W and pt was transferred with all personal belongings.

## 2017-03-09 NOTE — Social Work (Addendum)
CSW followed up with [facility] liason Atika per Dr. Jacquiline Doe request, for [facility] to follow up with pt.  [employer] approval [id number] approved for 7days, [employer] will follow up for reapproval  CSW spoke with MD who does not feel pt is stable enough to discharge today.  CSW will continue to follow and support discharge to [place] when medically appropriate.    Alexander Mt, [place] Work [phone]

## 2017-03-09 NOTE — Progress Notes (Signed)
Attempted to assist her to chair but claimed to wait for PT to see her.

## 2017-03-09 NOTE — Progress Notes (Signed)
Physical Therapy Treatment Patient Details Name: [name] MRN: [id number] DOB: June 02, 1929 Today's Date: 03/09/2017    History of Present Illness [name] a 83 y.o.femalewith medical history significantfor diabetes, hypertension, hyperlipidemia, anxiety, GERD, hypothyroidism, presenting after sustaining a mechanical fall onto her right hip. S/p IM intertrochanteric fixation of right hip, ORIF of right wrist 11/21.    PT Comments    Pt in bed upon arrival. Pt did not want to transfer to chair. With max encouragement and education about importance of pressure relief agreed reluctantly. Pt required VCs for hand placement from supine to sit. Max assist +2 for trunk elevation and LE management. Pt required min to mod assist with sitting EOB. Multiple cues throughout session to observe WB restrictions. Total assist +2 for St[place] transfer to chair. Pt refused to sit upright in [place]; leaned over the front bar.    Follow Up Recommendations  SNF;Supervision/Assistance - 24 hour     Equipment Recommendations  Wheelchair (measurements PT);Wheelchair cushion (measurements PT)    Recommendations for Other Services       Precautions / Restrictions Precautions Precautions: Fall Restrictions Weight Bearing Restrictions: Yes RUE Weight Bearing: Weight bear through elbow only RLE Weight Bearing: Weight bearing as tolerated Other Position/Activity Restrictions: no ROM at R wrist    Mobility  Bed Mobility Overal bed mobility: Needs Assistance Bed Mobility: Supine to Sit;Rolling Rolling: Max assist;+2 for physical assistance   Supine to sit: Max assist;+2 for physical assistance;HOB elevated     General bed mobility comments: Pt with limited ability to elevate trunk and manage LEs. Secondary to pain.  Transfers Overall transfer level: Needs assistance   Transfers: Sit to/from Stand Sit to Stand: Total assist;+2 physical assistance         General transfer comment: Pt  required VCs for hand placement and to observe WB status.  Ambulation/Gait                 Stairs            Wheelchair Mobility    Modified Rankin (Stroke Patients Only)       Balance Overall balance assessment: Needs assistance Sitting-balance support: Single extremity supported;Feet supported Sitting balance-Leahy Scale: Poor Sitting balance - Comments: Min-mod assist for sitting balance at EOB. Required UE support or physical assistance.                                    Cognition Arousal/Alertness: Awake/alert Behavior During Therapy: Anxious Overall Cognitive Status: Within Functional Limits for tasks assessed                                        Exercises      General Comments        Pertinent Vitals/Pain Pain Assessment: Faces Faces Pain Scale: Hurts whole lot Pain Location: R leg and wrist Pain Descriptors / Indicators: Discomfort;Guarding;Grimacing;Moaning;Tender Pain Intervention(s): Limited activity within patient's tolerance;Monitored during session;Repositioned    Home Living                      Prior Function            PT Goals (current goals can now be found in the care plan section) Acute Rehab PT Goals Patient Stated Goal: to decrease pain PT Goal Formulation: With patient  Time For Goal Achievement: 03/16/17 Potential to Achieve Goals: Fair Progress towards PT goals: Progressing toward goals    Frequency    Min 3X/week      PT Plan Current plan remains appropriate    Co-evaluation              AM-PAC PT "6 Clicks" Daily Activity  Outcome Measure  Difficulty turning over in bed (including adjusting bedclothes, sheets and blankets)?: Unable Difficulty moving from lying on back to sitting on the side of the bed? : Unable Difficulty sitting down on and standing up from a chair with arms (e.g., wheelchair, bedside commode, etc,.)?: Unable Help needed moving to and from  a bed to chair (including a wheelchair)?: Total Help needed walking in hospital room?: Total Help needed climbing 3-5 steps with a railing? : Total 6 Click Score: 6    End of Session Equipment Utilized During Treatment: Gait belt;Oxygen Activity Tolerance: Patient limited by pain Patient left: in chair;with call bell/phone within reach;with chair alarm set Nurse Communication: Mobility status PT Visit Diagnosis: Unsteadiness on feet (R26.81);Other abnormalities of gait and mobility (R26.89);History of falling (Z91.81);Muscle weakness (generalized) (M62.81);Pain Pain - Right/Left: Right Pain - part of body: Leg(wrist)     Time: 1779-3903 PT Time Calculation (min) (ACUTE ONLY): 42 min  Charges:  $Therapeutic Activity: 38-52 mins                    G Codes:       Janna Arch, SPTA   Janna Arch 03/09/2017, 4:58 PM

## 2017-03-10 LAB — BASIC METABOLIC PANEL
ANION GAP: 6 (ref 5–15)
BUN: 29 mg/dL — AB (ref 6–20)
CALCIUM: 7.5 mg/dL — AB (ref 8.9–10.3)
CO2: 28 mmol/L (ref 22–32)
CREATININE: 0.73 mg/dL (ref 0.44–1.00)
Chloride: 96 mmol/L — ABNORMAL LOW (ref 101–111)
GFR calc Af Amer: >60 mL/min (ref >60)
GFR calc non Af Amer: >60 mL/min (ref >60)
GLUCOSE: 76 mg/dL (ref 65–99)
Potassium: 3.9 mmol/L (ref 3.5–5.1)
Sodium: 130 mmol/L — ABNORMAL LOW (ref 135–145)

## 2017-03-10 LAB — CBC WITH DIFFERENTIAL/PLATELET
BASOS ABS: 0 10*3/uL (ref 0.0–0.1)
BASOS PCT: 0 %
EOS ABS: 0.6 10*3/uL (ref 0.0–0.7)
EOS PCT: 3 %
HEMATOCRIT: 32.1 % — AB (ref 36.0–46.0)
Hemoglobin: 10.5 g/dL — ABNORMAL LOW (ref 12.0–15.0)
Lymphocytes Relative: 21 %
Lymphs Abs: 3.6 10*3/uL (ref 0.7–4.0)
MCH: 27.5 pg (ref 26.0–34.0)
MCHC: 32.7 g/dL (ref 30.0–36.0)
MCV: 84 fL (ref 78.0–100.0)
MONO ABS: 1.7 10*3/uL — AB (ref 0.1–1.0)
MONOS PCT: 10 %
NEUTROS ABS: 11.6 10*3/uL — AB (ref 1.7–7.7)
Neutrophils Relative %: 66 %
PLATELETS: 316 10*3/uL (ref 150–400)
RBC: 3.82 MIL/uL — ABNORMAL LOW (ref 3.87–5.11)
RDW: 17.6 % — AB (ref 11.5–15.5)
WBC: 17.5 10*3/uL — ABNORMAL HIGH (ref 4.0–10.5)

## 2017-03-10 LAB — GLUCOSE, CAPILLARY
GLUCOSE-CAPILLARY: 65 mg/dL (ref 65–99)
GLUCOSE-CAPILLARY: 86 mg/dL (ref 65–99)
GLUCOSE-CAPILLARY: 190 mg/dL — AB (ref 65–99)
Glucose-Capillary: 120 mg/dL — ABNORMAL HIGH (ref 65–99)
Glucose-Capillary: 88 mg/dL (ref 65–99)

## 2017-03-10 MED ORDER — SODIUM CHLORIDE 0.9 % IV BOLUS (SEPSIS)
250.0000 mL | Freq: Once | INTRAVENOUS | Status: AC
Start: 2017-03-10 — End: 2017-03-10
  Administered 2017-03-10: 250 mL via INTRAVENOUS

## 2017-03-10 MED ORDER — SENNOSIDES-DOCUSATE SODIUM 8.6-50 MG PO TABS
1.0000 | ORAL_TABLET | Freq: Two times a day (BID) | ORAL | Status: DC
  Administered 2017-03-10 – 2017-03-17 (×14): 1 via ORAL
  Filled 2017-03-10 (×15): qty 1

## 2017-03-10 MED ORDER — FUROSEMIDE 40 MG PO TABS: 40.0000 mg | ORAL_TABLET | Freq: Every day | ORAL | Status: DC

## 2017-03-10 NOTE — Progress Notes (Signed)
Inpatient Diabetes Program Recommendations  AACE/ADA: New Consensus Statement on Inpatient Glycemic Control (2015)  Target Ranges:  Prepandial:   less than 140 mg/dL      Peak postprandial:   less than 180 mg/dL (1-2 hours)      Critically ill patients:  140 - 180 mg/dL   Results for AMSI, GRIMLEY (MRN [id number]) as of 03/10/2017 10:18  Ref. Range 03/10/2017 07:48 03/10/2017 08:43  Glucose-Capillary Latest Ref Range: 65 - 99 mg/dL 65 86    Home DM Meds: NPH Insulin 15 units QHS       Novolog 10 units TID  Current Insulin Orders: Lantus 20 units QHS      Novolog Moderate Correction Scale/ SSI (0-15 units) TID AC + HS      Novolog 5 units TID with meals       MD- Patient with Hypoglycemia this AM after receiving 20 units Lantus last PM.  Please consider reducing Lantus to 15 units QHS      --Will follow patient during hospitalization--  Wyn Quaker RN, MSN, CDE Diabetes Coordinator Inpatient Glycemic Control Team Team Pager: [phone] (8a-5p)

## 2017-03-10 NOTE — Progress Notes (Signed)
Pt BP 80/51, MAP 60, HR 57. Notified MD on call. Pt asymptomatic and resting. Will continue to monitor.

## 2017-03-10 NOTE — Consult Note (Signed)
   [facility] CM Inpatient Consult   03/10/2017  TYWANNA SEIFER Nov 17, 1929 [id number]  Follow up:  Met with the patient and family, daughter, [name] and son, Zannie Locastro regarding [place] Management follow up at [place] for transition of care planning for patient's return home. For assistance with medical management and transition needs.  Daughter states she would like to read the brochure and information in the [employer] plan.  She verbally states she thinks it will be alright to follow up,  but was waiting to talk with Percell Locus, CSW about her mom going to [place] and how many days approved.   [facility] will follow up at skilled facility with [facility].  For questions, please contact:  Natividad Brood, RN BSN [facility] Liaison  [phone] business mobile phone Toll free office [phone]

## 2017-03-10 NOTE — Care Management Important Message (Signed)
Important Message  Patient Details  Name: [name] MRN: [id number] Date of Birth: [birth year]   Medicare Important Message Given:  Yes    [name] May 03/10/2017, 11:34 AM

## 2017-03-10 NOTE — Progress Notes (Signed)
Progress Note  Patient Name: [name] Date of Encounter: 03/10/2017  Primary Cardiologist: Burt Knack   Subjective   83 year old female admitted with a right hip fracture.  She had a perioperative myocardial infarction.  Her ejection fraction is now 20-25%.  We have been diuresing her and gradually titrating up carvedilol.  She is feeling quite a bit better.  Total diuresis 3.9 L so far this admission Inpatient Medications    Scheduled Meds: . acetaminophen  1,000 mg Oral Q8H  . carvedilol  6.25 mg Oral BID WC  . doxycycline  100 mg Oral Q12H  . enoxaparin (LOVENOX) injection  40 mg Subcutaneous Q24H  . feeding supplement (GLUCERNA SHAKE)  237 mL Oral BID BM  . furosemide  40 mg Intravenous BID  . insulin aspart  0-15 Units Subcutaneous TID WC  . insulin aspart  0-5 Units Subcutaneous QHS  . insulin aspart  5 Units Subcutaneous TID WC  . insulin glargine  20 Units Subcutaneous QHS  . mouth rinse  15 mL Mouth Rinse BID  . pantoprazole  40 mg Oral Daily  . polyethylene glycol  17 g Oral Daily  . senna-docusate  1 tablet Oral BID   Continuous Infusions:  PRN Meds: bisacodyl, Gerhardt's butt cream, ondansetron, oxyCODONE   Vital Signs    Vitals:   03/10/17 0536 03/10/17 0800 03/10/17 1100 03/10/17 1213  BP: (!) 97/42   (!) 96/49  Pulse: 68   68  Resp: 18   18  Temp: 98.3 F (36.8 C)   98 F (36.7 C)  TempSrc: Oral   Oral  SpO2: 100% 94% 92% 96%  Weight:      Height:        Intake/Output Summary (Last 24 hours) at 03/10/2017 1309 Last data filed at 03/10/2017 0900 Gross per 24 hour  Intake 537.42 ml  Output 350 ml  Net 187.42 ml   Filed Weights   03/06/17 0419 03/07/17 0542 03/08/17 0352  Weight: 164 lb 3.9 oz (74.5 kg) 169 lb 15.6 oz (77.1 kg) 174 lb 2.6 oz (79 kg)    Telemetry    Normal sinus rhythm- Personally Reviewed  ECG    Normal sinus rhythm- Personally Reviewed  Physical Exam   GEN: No acute distress.   Neck: No JVD Cardiac: RRR, no  murmurs, rubs, or gallops.  Respiratory: Clear to auscultation bilaterally. GI: Soft, nontender, non-distended  MS: No edema; No deformity. Neuro:  Nonfocal  Psych: Normal affect   Labs    Chemistry Recent Labs  Lab 03/08/17 0314 03/09/17 0334 03/10/17 0401  NA 133* 133* 130*  K 3.7 3.9 3.9  CL 101 97* 96*  CO2 26 27 28   GLUCOSE 67 97 76  BUN 33* 30* 29*  CREATININE 0.76 0.77 0.73  CALCIUM 7.4* 7.4* 7.5*  GFRNONAA >60 >60 >60  GFRAA >60 >60 >60  ANIONGAP 6 9 6      Hematology Recent Labs  Lab 03/08/17 0314 03/09/17 0334 03/10/17 0401  WBC 22.6* 19.5* 17.5*  RBC 3.90 3.79* 3.82*  HGB 10.5* 10.0* 10.5*  HCT 32.4* 31.7* 32.1*  MCV 83.1 83.6 84.0  MCH 26.9 26.4 27.5  MCHC 32.4 31.5 32.7  RDW 16.6* 17.3* 17.6*  PLT 282 298 316    Cardiac Enzymes Recent Labs  Lab 03/06/17 0157 03/06/17 0758 03/06/17 1339 03/07/17 0953  TROPONINI 9.43* 8.90* 9.00* 6.87*   No results for input(s): TROPIPOC in the last 168 hours.   BNP Recent Labs  Lab 03/05/17  1346  BNP 3,581.4*     DDimer  Recent Labs  Lab 03/05/17 0808  DDIMER 2.17*     Radiology    No results found.  Cardiac Studies     Patient Profile     83 y.o. female admitted with a hip fracture and subsequently found to have a Peri-  operative myocardial infarction.  Assessment & Plan    1.  Non-ST segment elevation myocardial infarction: She seems to be doing much better on medical therapy.  Her blood pressure was a little low this morning.  We added losartan yesterday.  We will discontinue the losartan see if her blood pressure improves.  2.  Aortic stenosis/aortic insufficiency: Stable  3.  Pulmonary hypertension: Stable  4.  Acute combined systolic and diastolic congestive heart failure.  Continue carvedilol.  She became hypotensive after we added losartan so this will need to be discontinued.  May be able to add losartan at a later date.  For questions or updates, please contact [place] Please consult [email or web address] for contact info under Cardiology/STEMI.      Signed, Mertie Moores, MD  03/10/2017, 1:09 PM

## 2017-03-10 NOTE — Progress Notes (Signed)
250 cc normal saline fluid bolus completed per MD order. New BP 85/54, MAP 60, HR 66. Pt resting, not in distress. MD notified. Will continue to monitor.

## 2017-03-10 NOTE — Progress Notes (Signed)
PROGRESS NOTE    [name]  GYI:[id number] DOB: [birth year] DOA: 03/01/2017 PCP: Jinny Sanders, MD     Brief Narrative:  [name] is a 83 y.o. female with medical history significant for diabetes, hypertension, hyperlipidemia, anxiety, GERD, hypothyroidism, presenting after sustaining a mechanical fall onto her right hip when she was reaching over to the commode for the lid. She had right wrist pain and some swelling.  Patient reports trying to go to the bathroom, when she lost balance, with no loss of consciousness, or hitting her head.  She denies any dizziness, presyncope or syncope.  The patient denies any history of seizures.  She denies any chest pain, palpitations, or shortness of breath or cough.  She denies any abdominal pain, nausea or vomiting.  Denies any focal weakness, numbness or tingling.  The patient was immobilized, and a splint was placed on her right wrist as well.  Orthopedic surgery was consulted and patient underwent IM intertrochanteric fixation right hip, ORIF of right wrist 11/21 with Dr. Griffin Basil.  Hospitalization has been complicated by dyspnea, NSTEMI, anemia.  Today, pt denies any new complaints, reports being tired, but overall improving. Reports bilateral heel burning. Denies any chest pain, SOB, abdominal pain, N/V/D/C, fever/chills.   Assessment & Plan:   Principal Problem:   Closed right hip fracture ([place]) Active Problems:   Secondary DM with neurological manifestations ([place])   Diabetic peripheral neuropathy associated with type 2 diabetes mellitus (HCC)   Hyperlipidemia   GERD   Chronic constipation   Systolic murmur   Falls frequently   Pressure injury of skin   Occult blood positive stool   #Right hip and right wrist fracture after mechanical fall -S/p IM intertrochanteric fixation of right hip, ORIF of right wrist 11/21 with Dr. Griffin Basil -Follow up in 10-14 days. PT OT recommending SNF  Further management by ortho  #NSTEMI /Acute  systolic and diastolic heart failure -Chest pain free  -EKG showed anterolateral ST depression -Cardiology consulted. Known moderate AS and post op tachycardia and anemia may cause subendothelial ischemia. Coreg added.  -CXR small left pleural effusion, atelectasis, cardiomegaly  -BNP 3581  -Trop 7.46 --> 9.43 --> 8.9 --> 9 --> 6.87. No heparin due to anemia. Not candidate for cath  -Echo showed EF 20-25%, severe hypokinesis of mid-apical, anteroseptal, inferior, inferoseptal myocardium, grade 1 diastolic dysfunction  -Lasix 40mg  IV BID, low dose coreg, losartan held due to hypotension -Cardiology following   #Symptomatic acute blood loss anemia -Stable -Baseline Hgb/Hct 13.8/41.9 in August 2018  -Iron, B12 within normal limits, ferritin mildly elevated. Folate pending  -DIC labs, inconsistent with DIC  -LDH elevated 401, no schistocytes on smear  -Transfused 2u pRBC 11/25  -CT abd/pelvis and CT right femur negative for retroperitoneal bleed or hematoma  -FOBT positive. Consulted GI. Patient had EGD and colonoscopy in the past with [facility], Dr. Henrene Pastor. Plan for outpatient colonoscopy +/- EGD in 2-3 months once acute illnesses resolve   #Leukocytosis -Resolving, trending down slowly -?reactive to post-op stress and pain, acute illnesses as above. UA negative. Blood cultures negative. Afebrile.  -?mild cellulitis vs post-op change read on CT right femur, however, no clinical sign of erythema or infection of right thigh. Incision is clean and dressing dry.  -?proctitis on CT abd/pelvis  -Empiric doxycycline started 11/27 -Continue to trend CBC   #Hyponatremia -Worsening, ??overdiuresis  #Hyperkalemia -Resolved   #AKI on CKD stage 3 -Resolved -Avoid nephrotoxin  -Renal US negative for obstruction/hydronephrosis  #DM type  2 -Ha1c 6.6  -Lantus, SSI  #Hypertension  -Continue Coreg, held losartan  #Abnormal TSH  -TSH low 0.294, Free T4 normal 1.10.  -Repeat TSH/T4 as  outpatient once acute illnesses resolve   #Constipation -Resolved   #Sacral and right heel stage 1, not POA  -Wound RN consulted -Turn every 2 hours, offload pressure    DVT prophylaxis: Lovenox  Code Status: full Family Communication None at bedside Disposition Plan: SNF when stable   Consultants:   Orthopedic surgery  Cardiology   GI   Procedures:   Intramedullary nail intertrochanteric on right 11/21   Open reduction internal fixation write on right 11/21  Antimicrobials:  Anti-infectives (From admission, onward)   Start     Dose/Rate Route Frequency Ordered Stop   03/07/17 1200  doxycycline (VIBRA-TABS) tablet 100 mg     100 mg Oral Every 12 hours 03/07/17 1158     03/01/17 0815  ceFAZolin (ANCEF) IVPB 2g/100 mL premix     2 g 200 mL/hr over 30 Minutes Intravenous On call to O.R. 03/01/17 0757 03/01/17 0845       Objective: Vitals:   03/10/17 0536 03/10/17 0800 03/10/17 1100 03/10/17 1213  BP: (!) 97/42   (!) 96/49  Pulse: 68   68  Resp: 18   18  Temp: 98.3 F (36.8 C)   98 F (36.7 C)  TempSrc: Oral   Oral  SpO2: 100% 94% 92% 96%  Weight:      Height:        Intake/Output Summary (Last 24 hours) at 03/10/2017 1433 Last data filed at 03/10/2017 0900 Gross per 24 hour  Intake 537.42 ml  Output 350 ml  Net 187.42 ml   Filed Weights   03/06/17 0419 03/07/17 0542 03/08/17 0352  Weight: 74.5 kg (164 lb 3.9 oz) 77.1 kg (169 lb 15.6 oz) 79 kg (174 lb 2.6 oz)    Examination:  General exam: Appears calm  Respiratory system: Clear to auscultation. On [place] O2. Cardiovascular system: S1 & S2 heard, tachycardic, regular rhythm. +murmur  Gastrointestinal system: Abdomen is nondistended, soft and nontender. No organomegaly or masses felt. Normal bowel sounds heard. Central nervous system: Alert and oriented. No focal neurological deficits.  Extremities: +right wrist in dressing/wrap, +right hip dressing clean, +right thigh without erythema  Skin: +stage  1 right heel  Psychiatry: Judgement and insight appear normal. Mood & affect appropriate.   Data Reviewed: I have personally reviewed following labs and imaging studies  CBC: Recent Labs  Lab 03/04/17 0953  03/06/17 0157 03/07/17 0139 03/08/17 0314 03/09/17 0334 03/10/17 0401  WBC  --    < > 26.4* 25.6* 22.6* 19.5* 17.5*  NEUTROABS 15.5*  --   --  17.6* 15.8* 12.7* 11.6*  HGB  --    < > 9.7* 10.3* 10.5* 10.0* 10.5*  HCT  --    < > 29.4* 31.2* 32.4* 31.7* 32.1*  MCV  --    < > 80.1 80.4 83.1 83.6 84.0  PLT  --    < > 316 299 282 298 316   < > = values in this interval not displayed.   Basic Metabolic Panel: Recent Labs  Lab 03/04/17 0805  03/06/17 0157 03/07/17 0139 03/08/17 0314 03/09/17 0334 03/10/17 0401  NA  --    < > 130* 135 133* 133* 130*  K  --    < > 4.3 3.5 3.7 3.9 3.9  CL  --    < > 102 101 101  97* 96*  CO2  --    < > 17* 24 26 27 28   GLUCOSE  --    < > 190* 104* 67 97 76  BUN  --    < > 55* 41* 33* 30* 29*  CREATININE  --    < > 1.13* 0.85 0.76 0.77 0.73  CALCIUM  --    < > 7.5* 7.4* 7.4* 7.4* 7.5*  MG 2.1  --   --   --  1.9  --   --   PHOS 2.7  --   --   --   --   --   --    < > = values in this interval not displayed.   GFR: Estimated Creatinine Clearance: 51.8 mL/min (by C-G formula based on SCr of 0.73 mg/dL). Liver Function Tests: No results for input(s): AST, ALT, ALKPHOS, BILITOT, PROT, ALBUMIN in the last 168 hours. No results for input(s): LIPASE, AMYLASE in the last 168 hours. No results for input(s): AMMONIA in the last 168 hours. Coagulation Profile: Recent Labs  Lab 03/05/17 0808  INR 1.18   Cardiac Enzymes: Recent Labs  Lab 03/04/17 1230 03/05/17 1346 03/06/17 0157 03/06/17 0758 03/06/17 1339 03/07/17 0953  CKTOTAL 839*  --   --   --   --   --   TROPONINI  --  7.46* 9.43* 8.90* 9.00* 6.87*   BNP (last 3 results) No results for input(s): PROBNP in the last 8760 hours. HbA1C: No results for input(s): HGBA1C in the last 72  hours. CBG: Recent Labs  Lab 03/09/17 1605 03/09/17 2155 03/10/17 0748 03/10/17 0843 03/10/17 1208  GLUCAP 183* 119* 65 86 120*   Lipid Profile: No results for input(s): CHOL, HDL, LDLCALC, TRIG, CHOLHDL, LDLDIRECT in the last 72 hours. Thyroid Function Tests: No results for input(s): TSH, T4TOTAL, FREET4, T3FREE, THYROIDAB in the last 72 hours. Anemia Panel: No results for input(s): VITAMINB12, FOLATE, FERRITIN, TIBC, IRON, RETICCTPCT in the last 72 hours. Sepsis Labs: Recent Labs  Lab 03/05/17 1200 03/06/17 0157 03/06/17 0758  LATICACIDVEN 4.1* 1.4 1.5    Recent Results (from the past 240 hour(s))  Culture, blood (Routine X 2) w Reflex to ID Panel     Status: None   Collection Time: 03/01/17  3:55 PM  Result Value Ref Range Status   Specimen Description BLOOD BLOOD LEFT WRIST  Final   Special Requests   Final    IN PEDIATRIC BOTTLE Blood Culture results may not be optimal due to an excessive volume of blood received in culture bottles   Culture NO GROWTH 5 DAYS  Final   Report Status 03/06/2017 FINAL  Final  Culture, blood (Routine X 2) w Reflex to ID Panel     Status: None   Collection Time: 03/01/17  4:03 PM  Result Value Ref Range Status   Specimen Description BLOOD RIGHT HAND  Final   Special Requests IN PEDIATRIC BOTTLE Blood Culture adequate volume  Final   Culture NO GROWTH 5 DAYS  Final   Report Status 03/06/2017 FINAL  Final  MRSA PCR Screening     Status: None   Collection Time: 03/05/17  6:37 PM  Result Value Ref Range Status   MRSA by PCR NEGATIVE NEGATIVE Final    Comment:        The GeneXpert MRSA Assay (FDA approved for NASAL specimens only), is one component of a comprehensive MRSA colonization surveillance program. It is not intended to diagnose MRSA infection nor to  guide or monitor treatment for MRSA infections.        Radiology Studies: No results found.    Scheduled Meds: . acetaminophen  1,000 mg Oral Q8H  . carvedilol  6.25  mg Oral BID WC  . doxycycline  100 mg Oral Q12H  . enoxaparin (LOVENOX) injection  40 mg Subcutaneous Q24H  . feeding supplement (GLUCERNA SHAKE)  237 mL Oral BID BM  . furosemide  40 mg Intravenous BID  . insulin aspart  0-15 Units Subcutaneous TID WC  . insulin aspart  0-5 Units Subcutaneous QHS  . insulin aspart  5 Units Subcutaneous TID WC  . insulin glargine  20 Units Subcutaneous QHS  . mouth rinse  15 mL Mouth Rinse BID  . pantoprazole  40 mg Oral Daily  . polyethylene glycol  17 g Oral Daily  . senna-docusate  1 tablet Oral BID   Continuous Infusions:    LOS: 9 days    Time spent: 40 minutes   Alma Friendly, MD [facility] [email or web address] Password [facility] 03/10/2017, 2:33 PM

## 2017-03-11 ENCOUNTER — Inpatient Hospital Stay (HOSPITAL_COMMUNITY): Payer: PPO

## 2017-03-11 LAB — GLUCOSE, CAPILLARY
GLUCOSE-CAPILLARY: 193 mg/dL — AB (ref 65–99)
Glucose-Capillary: 116 mg/dL — ABNORMAL HIGH (ref 65–99)
Glucose-Capillary: 136 mg/dL — ABNORMAL HIGH (ref 65–99)
Glucose-Capillary: 104 mg/dL — ABNORMAL HIGH (ref 65–99)

## 2017-03-11 LAB — BASIC METABOLIC PANEL
ANION GAP: 10 (ref 5–15)
BUN: 23 mg/dL — ABNORMAL HIGH (ref 6–20)
CO2: 26 mmol/L (ref 22–32)
Calcium: 7.7 mg/dL — ABNORMAL LOW (ref 8.9–10.3)
Chloride: 93 mmol/L — ABNORMAL LOW (ref 101–111)
Creatinine, Ser: 0.74 mg/dL (ref 0.44–1.00)
GLUCOSE: 157 mg/dL — AB (ref 65–99)
POTASSIUM: 4.3 mmol/L (ref 3.5–5.1)
Sodium: 129 mmol/L — ABNORMAL LOW (ref 135–145)

## 2017-03-11 LAB — CBC WITH DIFFERENTIAL/PLATELET
BASOS ABS: 0 10*3/uL (ref 0.0–0.1)
BASOS PCT: 0 %
Eosinophils Absolute: 0.2 10*3/uL (ref 0.0–0.7)
Eosinophils Relative: 1 %
HEMATOCRIT: 32.1 % — AB (ref 36.0–46.0)
HEMOGLOBIN: 10.3 g/dL — AB (ref 12.0–15.0)
LYMPHS PCT: 17 %
Lymphs Abs: 2.8 10*3/uL (ref 0.7–4.0)
MCH: 26.7 pg (ref 26.0–34.0)
MCHC: 32.1 g/dL (ref 30.0–36.0)
MCV: 83.2 fL (ref 78.0–100.0)
MONO ABS: 1.7 10*3/uL — AB (ref 0.1–1.0)
Monocytes Relative: 11 %
NEUTROS ABS: 11.5 10*3/uL — AB (ref 1.7–7.7)
Neutrophils Relative %: 71 %
Platelets: 308 10*3/uL (ref 150–400)
RBC: 3.86 MIL/uL — ABNORMAL LOW (ref 3.87–5.11)
RDW: 17.4 % — AB (ref 11.5–15.5)
WBC: 16.2 10*3/uL — ABNORMAL HIGH (ref 4.0–10.5)

## 2017-03-11 MED ORDER — FUROSEMIDE 10 MG/ML IJ SOLN: 40.0000 mg | Freq: Every day | INTRAMUSCULAR | Status: DC

## 2017-03-11 MED ORDER — FUROSEMIDE 10 MG/ML IJ SOLN
40.0000 mg | Freq: Two times a day (BID) | INTRAMUSCULAR | Status: DC
  Administered 2017-03-11 – 2017-03-17 (×12): 40 mg via INTRAVENOUS
  Filled 2017-03-11 (×12): qty 4

## 2017-03-11 NOTE — Progress Notes (Signed)
Subjective:  Still complains of some dyspnea.  No recent weights.  Remains hyponatremic that may be a function of her reduced LV function.  Objective:  Vital Signs in the last 24 hours: BP (!) 118/59 (BP Location: Left Arm)   Pulse 72   Temp 97.7 F (36.5 C) (Oral)   Resp 19   Ht 5' 3.5" (1.613 m)   Wt 79 kg (174 lb 2.6 oz)   SpO2 97%   BMI 30.37 kg/m   Physical Exam: Elderly obese female in no acute distress Lungs: Reduced breath sounds at the base  cardiac:  Regular rhythm, normal S1 and S2, no S3, 2/6 systolic murmur of aortic stenosis Abdomen:  Soft, nontender, no masses Extremities: 1+ edema present  Intake/Output from previous day: 11/30 0701 - 12/01 0700 In: 240 [P.O.:240] Out: 1000 [Urine:1000] Weight Filed Weights   03/06/17 0419 03/07/17 0542 03/08/17 0352  Weight: 74.5 kg (164 lb 3.9 oz) 77.1 kg (169 lb 15.6 oz) 79 kg (174 lb 2.6 oz)    Lab Results: Basic Metabolic Panel: Recent Labs    03/10/17 0401 03/11/17 0238  NA 130* 129*  K 3.9 4.3  CL 96* 93*  CO2 28 26  GLUCOSE 76 157*  BUN 29* 23*  CREATININE 0.73 0.74    CBC: Recent Labs    03/10/17 0401 03/11/17 0238  WBC 17.5* 16.2*  NEUTROABS 11.6* 11.5*  HGB 10.5* 10.3*  HCT 32.1* 32.1*  MCV 84.0 83.2  PLT 316 308    BNP    Component Value Date/Time   BNP 3,581.4 (H) 03/05/2017 1346   Telemetry: Sinus rhythm, personally reviewed by me  Assessment/Plan: 1.  Ongoing dyspnea 2.  Acute combined systolic and diastolic heart failure-no current weights but evidently diuresing 3.  Non-STEMI 4.  Pulmonary hypertension 5.  Aortic stenosis/insufficiency stable  Recommendations:  Continue intravenous diuresis in light of dyspnea.  Renal function is stable and will ask him to get daily weights.      Kerry Hough  MD [facility]  03/11/2017, 10:54 AM

## 2017-03-11 NOTE — Progress Notes (Signed)
PROGRESS NOTE    [name]  FIE:[id number] DOB: Feb 29, 1930 DOA: 03/01/2017 PCP: Jinny Sanders, MD     Brief Narrative:  [name] is a 83 y.o. female with medical history significant for diabetes, hypertension, hyperlipidemia, anxiety, GERD, hypothyroidism, presenting after sustaining a mechanical fall onto her right hip when she was reaching over to the commode for the lid. She had right wrist pain and some swelling.  Patient reports trying to go to the bathroom, when she lost balance, with no loss of consciousness, or hitting her head.  She denies any dizziness, presyncope or syncope.  The patient denies any history of seizures.  She denies any chest pain, palpitations, or shortness of breath or cough.  She denies any abdominal pain, nausea or vomiting.  Denies any focal weakness, numbness or tingling.  The patient was immobilized, and a splint was placed on her right wrist as well.  Orthopedic surgery was consulted and patient underwent IM intertrochanteric fixation right hip, ORIF of right wrist 11/21 with Dr. Griffin Basil.  Hospitalization has been complicated by dyspnea, NSTEMI, anemia.  Today, pt noted to more dysneic than prior days before. Reports bilateral heel burning has remained the same. Denies any chest pain, abdominal pain, N/V/D/C, fever/chills.   Assessment & Plan:   Principal Problem:   Closed right hip fracture ([place]) Active Problems:   Secondary DM with neurological manifestations ([place])   Diabetic peripheral neuropathy associated with type 2 diabetes mellitus (HCC)   Hyperlipidemia   GERD   Chronic constipation   Systolic murmur   Falls frequently   Pressure injury of skin   Occult blood positive stool   #Right hip and right wrist fracture after mechanical fall -S/p IM intertrochanteric fixation of right hip, ORIF of right wrist 11/21 with Dr. Griffin Basil -Follow up in 10-14 days. PT OT recommending SNF  Further management by ortho  #NSTEMI /Acute systolic and  diastolic heart failure -Chest pain free  -EKG showed anterolateral ST depression -Cardiology consulted. Known moderate AS and post op tachycardia and anemia may cause subendothelial ischemia. Coreg added.  -CXR small left pleural effusion, atelectasis, cardiomegaly  -BNP 3581  -Trop 7.46 --> 9.43 --> 8.9 --> 9 --> 6.87. No heparin due to anemia. Not candidate for cath  -Echo showed EF 20-25%, severe hypokinesis of mid-apical, anteroseptal, inferior, inferoseptal myocardium, grade 1 diastolic dysfunction  -Lasix 40mg  IV BID, low dose coreg, losartan held due to hypotension -Cardiology following   #Symptomatic acute blood loss anemia -Stable -Baseline Hgb/Hct 13.8/41.9 in August 2018  -Iron, B12 within normal limits, ferritin mildly elevated. Folate pending  -DIC labs, inconsistent with DIC  -LDH elevated 401, no schistocytes on smear  -Transfused 2u pRBC 11/25  -CT abd/pelvis and CT right femur negative for retroperitoneal bleed or hematoma  -FOBT positive. Consulted GI. Patient had EGD and colonoscopy in the past with [facility], Dr. Henrene Pastor. Plan for outpatient colonoscopy +/- EGD in 2-3 months once acute illnesses resolve   #Leukocytosis -Resolving, trending down slowly -?reactive to post-op stress and pain, acute illnesses as above. UA negative. Blood cultures negative. Afebrile.  -?mild cellulitis vs post-op change read on CT right femur, however, no clinical sign of erythema or infection of right thigh. Incision is clean and dressing dry.  -?proctitis on CT abd/pelvis  -Empiric doxycycline started 11/27 -Continue to trend CBC   #Hyponatremia -Worsening -?? Likely 2/2 CHF -Daily BMP  #Hyperkalemia -Resolved   #AKI on CKD stage 3 -Resolved -Avoid nephrotoxin  -Renal US  negative for obstruction/hydronephrosis  #DM type 2 -Ha1c 6.6  -Lantus, SSI  #Hypertension  -Continue Coreg, held losartan  #Abnormal TSH  -TSH low 0.294, Free T4 normal 1.10.  -Repeat TSH/T4 as  outpatient once acute illnesses resolve   #Constipation -Resolved   #Sacral and right heel stage 1, not POA  -Wound RN consulted -Turn every 2 hours, offload pressure    DVT prophylaxis: Lovenox  Code Status: full Family Communication None at bedside Disposition Plan: SNF when stable   Consultants:   Orthopedic surgery  Cardiology   GI   Procedures:   Intramedullary nail intertrochanteric on right 11/21   Open reduction internal fixation write on right 11/21  Antimicrobials:  Anti-infectives (From admission, onward)   Start     Dose/Rate Route Frequency Ordered Stop   03/07/17 1200  doxycycline (VIBRA-TABS) tablet 100 mg     100 mg Oral Every 12 hours 03/07/17 1158     03/01/17 0815  ceFAZolin (ANCEF) IVPB 2g/100 mL premix     2 g 200 mL/hr over 30 Minutes Intravenous On call to O.R. 03/01/17 0757 03/01/17 0845       Objective: Vitals:   03/10/17 1213 03/10/17 1727 03/10/17 2140 03/11/17 0631  BP: (!) 96/49 118/65 125/60 (!) 118/59  Pulse: 68 80 83 72  Resp: 18  20 19   Temp: 98 F (36.7 C)  98 F (36.7 C) 97.7 F (36.5 C)  TempSrc: Oral  Oral Oral  SpO2: 96%  97% 97%  Weight:      Height:        Intake/Output Summary (Last 24 hours) at 03/11/2017 1400 Last data filed at 03/11/2017 0216 Gross per 24 hour  Intake -  Output 1000 ml  Net -1000 ml   Filed Weights   03/06/17 0419 03/07/17 0542 03/08/17 0352  Weight: 74.5 kg (164 lb 3.9 oz) 77.1 kg (169 lb 15.6 oz) 79 kg (174 lb 2.6 oz)    Examination:  General exam: Appears calm  Respiratory system: Clear to auscultation. On [place] O2. Cardiovascular system: S1 & S2 heard, tachycardic, regular rhythm. +murmur  Gastrointestinal system: Abdomen is nondistended, soft and nontender. No organomegaly or masses felt. Normal bowel sounds heard. Central nervous system: Alert and oriented. No focal neurological deficits.  Extremities: +right wrist in dressing/wrap, +right hip dressing clean, +right thigh without  erythema  Skin: +stage 1 right heel  Psychiatry: Judgement and insight appear normal. Mood & affect appropriate.   Data Reviewed: I have personally reviewed following labs and imaging studies  CBC: Recent Labs  Lab 03/07/17 0139 03/08/17 0314 03/09/17 0334 03/10/17 0401 03/11/17 0238  WBC 25.6* 22.6* 19.5* 17.5* 16.2*  NEUTROABS 17.6* 15.8* 12.7* 11.6* 11.5*  HGB 10.3* 10.5* 10.0* 10.5* 10.3*  HCT 31.2* 32.4* 31.7* 32.1* 32.1*  MCV 80.4 83.1 83.6 84.0 83.2  PLT 299 282 298 316 366   Basic Metabolic Panel: Recent Labs  Lab 03/07/17 0139 03/08/17 0314 03/09/17 0334 03/10/17 0401 03/11/17 0238  NA 135 133* 133* 130* 129*  K 3.5 3.7 3.9 3.9 4.3  CL 101 101 97* 96* 93*  CO2 24 26 27 28 26   GLUCOSE 104* 67 97 76 157*  BUN 41* 33* 30* 29* 23*  CREATININE 0.85 0.76 0.77 0.73 0.74  CALCIUM 7.4* 7.4* 7.4* 7.5* 7.7*  MG  --  1.9  --   --   --    GFR: Estimated Creatinine Clearance: 51.8 mL/min (by C-G formula based on SCr of 0.74 mg/dL). Liver Function  Tests: No results for input(s): AST, ALT, ALKPHOS, BILITOT, PROT, ALBUMIN in the last 168 hours. No results for input(s): LIPASE, AMYLASE in the last 168 hours. No results for input(s): AMMONIA in the last 168 hours. Coagulation Profile: Recent Labs  Lab 03/05/17 0808  INR 1.18   Cardiac Enzymes: Recent Labs  Lab 03/05/17 1346 03/06/17 0157 03/06/17 0758 03/06/17 1339 03/07/17 0953  TROPONINI 7.46* 9.43* 8.90* 9.00* 6.87*   BNP (last 3 results) No results for input(s): PROBNP in the last 8760 hours. HbA1C: No results for input(s): HGBA1C in the last 72 hours. CBG: Recent Labs  Lab 03/10/17 1208 03/10/17 1642 03/10/17 2105 03/11/17 0745 03/11/17 1235  GLUCAP 120* 190* 88 116* 193*   Lipid Profile: No results for input(s): CHOL, HDL, LDLCALC, TRIG, CHOLHDL, LDLDIRECT in the last 72 hours. Thyroid Function Tests: No results for input(s): TSH, T4TOTAL, FREET4, T3FREE, THYROIDAB in the last 72  hours. Anemia Panel: No results for input(s): VITAMINB12, FOLATE, FERRITIN, TIBC, IRON, RETICCTPCT in the last 72 hours. Sepsis Labs: Recent Labs  Lab 03/05/17 1200 03/06/17 0157 03/06/17 0758  LATICACIDVEN 4.1* 1.4 1.5    Recent Results (from the past 240 hour(s))  Culture, blood (Routine X 2) w Reflex to ID Panel     Status: None   Collection Time: 03/01/17  3:55 PM  Result Value Ref Range Status   Specimen Description BLOOD BLOOD LEFT WRIST  Final   Special Requests   Final    IN PEDIATRIC BOTTLE Blood Culture results may not be optimal due to an excessive volume of blood received in culture bottles   Culture NO GROWTH 5 DAYS  Final   Report Status 03/06/2017 FINAL  Final  Culture, blood (Routine X 2) w Reflex to ID Panel     Status: None   Collection Time: 03/01/17  4:03 PM  Result Value Ref Range Status   Specimen Description BLOOD RIGHT HAND  Final   Special Requests IN PEDIATRIC BOTTLE Blood Culture adequate volume  Final   Culture NO GROWTH 5 DAYS  Final   Report Status 03/06/2017 FINAL  Final  MRSA PCR Screening     Status: None   Collection Time: 03/05/17  6:37 PM  Result Value Ref Range Status   MRSA by PCR NEGATIVE NEGATIVE Final    Comment:        The GeneXpert MRSA Assay (FDA approved for NASAL specimens only), is one component of a comprehensive MRSA colonization surveillance program. It is not intended to diagnose MRSA infection nor to guide or monitor treatment for MRSA infections.        Radiology Studies: No results found.    Scheduled Meds: . acetaminophen  1,000 mg Oral Q8H  . carvedilol  6.25 mg Oral BID WC  . doxycycline  100 mg Oral Q12H  . enoxaparin (LOVENOX) injection  40 mg Subcutaneous Q24H  . feeding supplement (GLUCERNA SHAKE)  237 mL Oral BID BM  . furosemide  40 mg Intravenous BID  . insulin aspart  0-15 Units Subcutaneous TID WC  . insulin aspart  0-5 Units Subcutaneous QHS  . insulin aspart  5 Units Subcutaneous TID WC   . insulin glargine  20 Units Subcutaneous QHS  . mouth rinse  15 mL Mouth Rinse BID  . pantoprazole  40 mg Oral Daily  . polyethylene glycol  17 g Oral Daily  . senna-docusate  1 tablet Oral BID   Continuous Infusions:    LOS: 10 days  Time spent: 40 minutes   Alma Friendly, MD [facility] [email or web address] Password [email or web address] 03/11/2017, 2:00 PM

## 2017-03-11 NOTE — Plan of Care (Signed)
Patient progressing 

## 2017-03-12 LAB — BASIC METABOLIC PANEL
Anion gap: 8 (ref 5–15)
BUN: 18 mg/dL (ref 6–20)
CALCIUM: 8.3 mg/dL — AB (ref 8.9–10.3)
CO2: 28 mmol/L (ref 22–32)
CREATININE: 0.67 mg/dL (ref 0.44–1.00)
Chloride: 97 mmol/L — ABNORMAL LOW (ref 101–111)
GFR calc Af Amer: >60 mL/min (ref >60)
GLUCOSE: 123 mg/dL — AB (ref 65–99)
Potassium: 4.4 mmol/L (ref 3.5–5.1)
Sodium: 133 mmol/L — ABNORMAL LOW (ref 135–145)

## 2017-03-12 LAB — GLUCOSE, CAPILLARY
GLUCOSE-CAPILLARY: 117 mg/dL — AB (ref 65–99)
GLUCOSE-CAPILLARY: 156 mg/dL — AB (ref 65–99)
Glucose-Capillary: 112 mg/dL — ABNORMAL HIGH (ref 65–99)
Glucose-Capillary: 160 mg/dL — ABNORMAL HIGH (ref 65–99)
Glucose-Capillary: 162 mg/dL — ABNORMAL HIGH (ref 65–99)

## 2017-03-12 LAB — CBC WITH DIFFERENTIAL/PLATELET
BASOS ABS: 0 10*3/uL (ref 0.0–0.1)
BASOS PCT: 0 %
EOS PCT: 2 %
Eosinophils Absolute: 0.3 10*3/uL (ref 0.0–0.7)
HEMATOCRIT: 34.1 % — AB (ref 36.0–46.0)
Hemoglobin: 10.8 g/dL — ABNORMAL LOW (ref 12.0–15.0)
Lymphocytes Relative: 17 %
Lymphs Abs: 2.6 10*3/uL (ref 0.7–4.0)
MCH: 26.9 pg (ref 26.0–34.0)
MCHC: 31.7 g/dL (ref 30.0–36.0)
MCV: 84.8 fL (ref 78.0–100.0)
MONO ABS: 1.5 10*3/uL — AB (ref 0.1–1.0)
MONOS PCT: 10 %
NEUTROS ABS: 10.7 10*3/uL — AB (ref 1.7–7.7)
Neutrophils Relative %: 71 %
PLATELETS: 360 10*3/uL (ref 150–400)
RBC: 4.02 MIL/uL (ref 3.87–5.11)
RDW: 17.7 % — AB (ref 11.5–15.5)
WBC: 15.1 10*3/uL — ABNORMAL HIGH (ref 4.0–10.5)

## 2017-03-12 NOTE — Progress Notes (Signed)
Subjective:  She says that her shortness of breath is somewhat better today.  Still mildly short of breath.  Having some paroxysmal atrial fibrillation on telemetry today.  Objective:  Vital Signs in the last 24 hours: BP 126/60 (BP Location: Left Arm)   Pulse 88   Temp 97.6 F (36.4 C) (Oral)   Resp 20   Ht 5' 3.5" (1.613 m)   Wt 79 kg (174 lb 2.6 oz)   SpO2 98%   BMI 30.37 kg/m   Physical Exam: Elderly obese female in no acute distress Lungs: Reduced breath sounds at the base  cardiac:  Regular rhythm, normal S1 and S2, no S3, 2/6 systolic murmur of aortic stenosis Abdomen:  Soft, nontender, no masses Extremities: 1+ edema present  Intake/Output from previous day: 12/01 0701 - 12/02 0700 In: 350 [P.O.:350] Out: 1000 [Urine:1000] Weight Filed Weights   03/07/17 0542 03/08/17 0352  Weight: 77.1 kg (169 lb 15.6 oz) 79 kg (174 lb 2.6 oz)    Lab Results: Basic Metabolic Panel: Recent Labs    03/11/17 0238 03/12/17 0422  NA 129* 133*  K 4.3 4.4  CL 93* 97*  CO2 26 28  GLUCOSE 157* 123*  BUN 23* 18  CREATININE 0.74 0.67    CBC: Recent Labs    03/11/17 0238 03/12/17 0422  WBC 16.2* 15.1*  NEUTROABS 11.5* 10.7*  HGB 10.3* 10.8*  HCT 32.1* 34.1*  MCV 83.2 84.8  PLT 308 360    BNP    Component Value Date/Time   BNP 3,581.4 (H) 03/05/2017 1346   Telemetry: Sinus rhythm with PACs, some bursts of nonsustained atrial fibrillation are also noted.  Assessment/Plan: 1.  Ongoing dyspnea that has improved 2.  Acute combined systolic and diastolic heart failure-no current weights but evidently diuresing 3.  Non-STEMI 4.  Pulmonary hypertension 5.  Aortic stenosis/insufficiency stable 6.  Paroxysmal atrial fibrillation that may be contributing to dyspnea 7.  Hyponatremia somewhat improved  Recommendations:  No weights and will discuss with floor staff.  Continue intravenous diuresis.  The atrial fibrillation is concerning and likely will need to go on  long-term anticoagulation, however she has been transfused this admissionso would hold off on this at the present time.  Did have elevation of troponins it is uncertain whether it is due to ischemia or heart failure.Kerry Hough  MD [facility] Cardiology  03/12/2017, 8:59 AM

## 2017-03-12 NOTE — Plan of Care (Signed)
Patient progressing 

## 2017-03-12 NOTE — Progress Notes (Signed)
Patient weight is not recorded because the bed initial weight is not kept zero .Patient can't get up from the bed now and hip precaution is continued

## 2017-03-12 NOTE — Progress Notes (Signed)
Subjective:  Still complains of some dyspnea.  No recent weights.  Remains hyponatremic that may be a function of her reduced LV function.  Objective:  Vital Signs in the last 24 hours: BP 126/60 (BP Location: Left Arm)   Pulse 88   Temp 97.6 F (36.4 C) (Oral)   Resp 20   Ht 5' 3.5" (1.613 m)   Wt 79 kg (174 lb 2.6 oz)   SpO2 98%   BMI 30.37 kg/m   Physical Exam: Elderly obese female in no acute distress Lungs: Reduced breath sounds at the base  cardiac:  Regular rhythm, normal S1 and S2, no S3, 2/6 systolic murmur of aortic stenosis Abdomen:  Soft, nontender, no masses Extremities: 1+ edema present  Intake/Output from previous day: 12/01 0701 - 12/02 0700 In: 350 [P.O.:350] Out: 1000 [Urine:1000] Weight Filed Weights   03/07/17 0542 03/08/17 0352  Weight: 77.1 kg (169 lb 15.6 oz) 79 kg (174 lb 2.6 oz)    Lab Results: Basic Metabolic Panel: Recent Labs    03/11/17 0238 03/12/17 0422  NA 129* 133*  K 4.3 4.4  CL 93* 97*  CO2 26 28  GLUCOSE 157* 123*  BUN 23* 18  CREATININE 0.74 0.67    CBC: Recent Labs    03/11/17 0238 03/12/17 0422  WBC 16.2* 15.1*  NEUTROABS 11.5* 10.7*  HGB 10.3* 10.8*  HCT 32.1* 34.1*  MCV 83.2 84.8  PLT 308 360    BNP    Component Value Date/Time   BNP 3,581.4 (H) 03/05/2017 1346   Telemetry: Sinus rhythm, personally reviewed by me  Assessment/Plan: 1.  Ongoing dyspnea 2.  Acute combined systolic and diastolic heart failure-no current weights but evidently diuresing 3.  Non-STEMI 4.  Pulmonary hypertension 5.  Aortic stenosis/insufficiency stable  Recommendations:  Continue intravenous diuresis in light of dyspnea.  Renal function is stable and will ask him to get daily weights.      Kerry Hough  MD [facility] Cardiology  03/12/2017, 8:45 AM

## 2017-03-12 NOTE — Progress Notes (Signed)
PROGRESS NOTE    [name]  EXH:[id number] DOB: [birth year] DOA: 03/01/2017 PCP: Jinny Sanders, MD     Brief Narrative:  [name] is a 83 y.o. female with medical history significant for diabetes, hypertension, hyperlipidemia, anxiety, GERD, hypothyroidism, presenting after sustaining a mechanical fall onto her right hip when she was reaching over to the commode for the lid. She had right wrist pain and some swelling.  Patient reports trying to go to the bathroom, when she lost balance, with no loss of consciousness, or hitting her head.  She denies any dizziness, presyncope or syncope.  The patient denies any history of seizures.  She denies any chest pain, palpitations, or shortness of breath or cough.  She denies any abdominal pain, nausea or vomiting.  Denies any focal weakness, numbness or tingling.  The patient was immobilized, and a splint was placed on her right wrist as well.  Orthopedic surgery was consulted and patient underwent IM intertrochanteric fixation right hip, ORIF of right wrist 11/21 with Dr. Griffin Basil.  Hospitalization has been complicated by dyspnea, NSTEMI, anemia.  Today, pt noted to be improved, but still dysneic. Reports bilateral heel burning has remained the same. Denies any chest pain, abdominal pain, N/V/D/C, fever/chills. Noted P.Afib on telemetry.  Assessment & Plan:   Principal Problem:   Closed right hip fracture ([place]) Active Problems:   Secondary DM with neurological manifestations ([place])   Diabetic peripheral neuropathy associated with type 2 diabetes mellitus (H[facility])   Hyperlipidemia   GERD   Chronic constipation   Systolic murmur   Falls frequently   Pressure injury of skin   Occult blood positive stool   #Right hip and right wrist fracture after mechanical fall -S/p IM intertrochanteric fixation of right hip, ORIF of right wrist 11/21 with Dr. Griffin Basil -Follow up in 10-14 days. PT OT recommending SNF  Further management by ortho  #NSTEMI  /Acute systolic and diastolic heart failure -Chest pain free  -EKG showed anterolateral ST depression -Cardiology consulted. Known moderate AS and post op tachycardia and anemia may cause subendothelial ischemia. Coreg added.  -CXR small left pleural effusion, atelectasis, cardiomegaly  -BNP 3581  -Trop 7.46 --> 9.43 --> 8.9 --> 9 --> 6.87. No heparin due to anemia. Not candidate for cath  -Echo showed EF 20-25%, severe hypokinesis of mid-apical, anteroseptal, inferior, inferoseptal myocardium, grade 1 diastolic dysfunction  -Lasix 40mg  IV BID, low dose coreg, losartan held due to hypotension -Noted P.Afib on tele as noted by cardiology, but will hold off [facility] at this time due to recent transfusion  -Cardiology following   #Symptomatic acute blood loss anemia -Stable -Baseline Hgb/Hct 13.8/41.9 in August 2018  -Iron, B12 within normal limits, ferritin mildly elevated. Folate pending  -DIC labs, inconsistent with DIC  -LDH elevated 401, no schistocytes on smear  -Transfused 2u pRBC 11/25  -CT abd/pelvis and CT right femur negative for retroperitoneal bleed or hematoma  -FOBT positive. Consulted GI. Patient had EGD and colonoscopy in the past with [facility], Dr. Henrene Pastor. Plan for outpatient colonoscopy +/- EGD in 2-3 months once acute illnesses resolve   #Leukocytosis -Resolving, trending down slowly -?reactive to post-op stress and pain, acute illnesses as above. UA negative. Blood cultures negative. Afebrile.  -?mild cellulitis vs post-op change read on CT right femur, however, no clinical sign of erythema or infection of right thigh. Incision is clean and dressing dry.  -?proctitis on CT abd/pelvis  -Empiric doxycycline started 11/27 -Continue to trend CBC   #Hyponatremia -  Improving -?? Likely 2/2 CHF -Daily BMP  #Hyperkalemia -Resolved   #AKI on CKD stage 3 -Resolved -Avoid nephrotoxin  -Renal US negative for obstruction/hydronephrosis  #DM type 2 -Ha1c 6.6  -Lantus,  SSI  #Hypertension  -Continue Coreg, held losartan  #Abnormal TSH  -TSH low 0.294, Free T4 normal 1.10.  -Repeat TSH/T4 as outpatient once acute illnesses resolve   #Constipation -Resolved   #Sacral and right heel stage 1, not POA  -Wound RN consulted -Turn every 2 hours, offload pressure    DVT prophylaxis: Lovenox  Code Status: full Family Communication None at bedside Disposition Plan: SNF when stable   Consultants:   Orthopedic surgery  Cardiology   GI   Procedures:   Intramedullary nail intertrochanteric on right 11/21   Open reduction internal fixation write on right 11/21  Antimicrobials:  Anti-infectives (From admission, onward)   Start     Dose/Rate Route Frequency Ordered Stop   03/07/17 1200  doxycycline (VIBRA-TABS) tablet 100 mg     100 mg Oral Every 12 hours 03/07/17 1158     03/01/17 0815  ceFAZolin (ANCEF) IVPB 2g/100 mL premix     2 g 200 mL/hr over 30 Minutes Intravenous On call to O.R. 03/01/17 0757 03/01/17 0845       Objective: Vitals:   03/11/17 2119 03/12/17 0602 03/12/17 1130 03/12/17 1504  BP: (!) 112/51 126/60  (!) 116/50  Pulse: 93 88  74  Resp: 18 20  16   Temp:  97.6 F (36.4 C)  97.6 F (36.4 C)  TempSrc:  Oral  Oral  SpO2: 98% 98% 98% 100%  Weight:      Height:        Intake/Output Summary (Last 24 hours) at 03/12/2017 1542 Last data filed at 03/12/2017 1152 Gross per 24 hour  Intake 350 ml  Output 1350 ml  Net -1000 ml   Filed Weights   03/07/17 0542 03/08/17 0352  Weight: 77.1 kg (169 lb 15.6 oz) 79 kg (174 lb 2.6 oz)    Examination:  General exam: Appears calm  Respiratory system: Clear to auscultation. On [place] O2. Cardiovascular system: S1 & S2 heard, tachycardic, regular rhythm. +murmur  Gastrointestinal system: Abdomen is nondistended, soft and nontender. No organomegaly or masses felt. Normal bowel sounds heard. Central nervous system: Alert and oriented. No focal neurological deficits.  Extremities:  +right wrist in dressing/wrap, +right hip dressing clean, +right thigh without erythema  Skin: +stage 1 right heel  Psychiatry: Judgement and insight appear normal. Mood & affect appropriate.   Data Reviewed: I have personally reviewed following labs and imaging studies  CBC: Recent Labs  Lab 03/08/17 0314 03/09/17 0334 03/10/17 0401 03/11/17 0238 03/12/17 0422  WBC 22.6* 19.5* 17.5* 16.2* 15.1*  NEUTROABS 15.8* 12.7* 11.6* 11.5* 10.7*  HGB 10.5* 10.0* 10.5* 10.3* 10.8*  HCT 32.4* 31.7* 32.1* 32.1* 34.1*  MCV 83.1 83.6 84.0 83.2 84.8  PLT 282 298 316 308 240   Basic Metabolic Panel: Recent Labs  Lab 03/08/17 0314 03/09/17 0334 03/10/17 0401 03/11/17 0238 03/12/17 0422  NA 133* 133* 130* 129* 133*  K 3.7 3.9 3.9 4.3 4.4  CL 101 97* 96* 93* 97*  CO2 26 27 28 26 28   GLUCOSE 67 97 76 157* 123*  BUN 33* 30* 29* 23* 18  CREATININE 0.76 0.77 0.73 0.74 0.67  CALCIUM 7.4* 7.4* 7.5* 7.7* 8.3*  MG 1.9  --   --   --   --    GFR: Estimated Creatinine Clearance:  51.8 mL/min (by C-G formula based on SCr of 0.67 mg/dL). Liver Function Tests: No results for input(s): AST, ALT, ALKPHOS, BILITOT, PROT, ALBUMIN in the last 168 hours. No results for input(s): LIPASE, AMYLASE in the last 168 hours. No results for input(s): AMMONIA in the last 168 hours. Coagulation Profile: No results for input(s): INR, PROTIME in the last 168 hours. Cardiac Enzymes: Recent Labs  Lab 03/06/17 0157 03/06/17 0758 03/06/17 1339 03/07/17 0953  TROPONINI 9.43* 8.90* 9.00* 6.87*   BNP (last 3 results) No results for input(s): PROBNP in the last 8760 hours. HbA1C: No results for input(s): HGBA1C in the last 72 hours. CBG: Recent Labs  Lab 03/11/17 1656 03/11/17 2148 03/12/17 0605 03/12/17 0809 03/12/17 1228  GLUCAP 136* 104* 162* 117* 156*   Lipid Profile: No results for input(s): CHOL, HDL, LDLCALC, TRIG, CHOLHDL, LDLDIRECT in the last 72 hours. Thyroid Function Tests: No results for  input(s): TSH, T4TOTAL, FREET4, T3FREE, THYROIDAB in the last 72 hours. Anemia Panel: No results for input(s): VITAMINB12, FOLATE, FERRITIN, TIBC, IRON, RETICCTPCT in the last 72 hours. Sepsis Labs: Recent Labs  Lab 03/06/17 0157 03/06/17 0758  LATICACIDVEN 1.4 1.5    Recent Results (from the past 240 hour(s))  MRSA PCR Screening     Status: None   Collection Time: 03/05/17  6:37 PM  Result Value Ref Range Status   MRSA by PCR NEGATIVE NEGATIVE Final    Comment:        The GeneXpert MRSA Assay (FDA approved for NASAL specimens only), is one component of a comprehensive MRSA colonization surveillance program. It is not intended to diagnose MRSA infection nor to guide or monitor treatment for MRSA infections.        Radiology Studies: Dg Chest Port 1 View  Result Date: 03/11/2017 CLINICAL DATA:  Shortness of breath. EXAM: PORTABLE CHEST 1 VIEW COMPARISON:  03/05/2017 FINDINGS: 1418 hours. Low volume film. The cardio pericardial silhouette is enlarged. There is pulmonary vascular congestion without overt pulmonary edema. Diffuse interstitial and basilar airspace disease likely related to pulmonary edema although infection at the bases not excluded. Small bilateral pleural effusions are associated. Bones are diffusely demineralized. Left clavicle fracture was present on the prior study. Telemetry leads overlie the chest. IMPRESSION: 1. Cardiomegaly with interstitial and basilar airspace disease suggesting edema with associated small bilateral pleural effusions. 2. Left clavicle fracture. Electronically Signed   By: Misty Stanley M.D.   On: 03/11/2017 14:37      Scheduled Meds: . acetaminophen  1,000 mg Oral Q8H  . carvedilol  6.25 mg Oral BID WC  . doxycycline  100 mg Oral Q12H  . enoxaparin (LOVENOX) injection  40 mg Subcutaneous Q24H  . feeding supplement (GLUCERNA SHAKE)  237 mL Oral BID BM  . furosemide  40 mg Intravenous BID  . insulin aspart  0-15 Units Subcutaneous  TID WC  . insulin aspart  0-5 Units Subcutaneous QHS  . insulin aspart  5 Units Subcutaneous TID WC  . insulin glargine  20 Units Subcutaneous QHS  . mouth rinse  15 mL Mouth Rinse BID  . pantoprazole  40 mg Oral Daily  . polyethylene glycol  17 g Oral Daily  . senna-docusate  1 tablet Oral BID   Continuous Infusions:    LOS: 11 days    Time spent: 40 minutes   Alma Friendly, MD [facility] [email or web address] Password [facility] 03/12/2017, 3:42 PM

## 2017-03-13 DIAGNOSIS — I5021 Acute systolic (congestive) heart failure: Secondary | ICD-10-CM

## 2017-03-13 LAB — CBC WITH DIFFERENTIAL/PLATELET
BASOS ABS: 0 10*3/uL (ref 0.0–0.1)
BASOS PCT: 0 %
EOS ABS: 0.4 10*3/uL (ref 0.0–0.7)
Eosinophils Relative: 3 %
HEMATOCRIT: 32.1 % — AB (ref 36.0–46.0)
HEMOGLOBIN: 10.1 g/dL — AB (ref 12.0–15.0)
LYMPHS PCT: 17 %
Lymphs Abs: 2.4 10*3/uL (ref 0.7–4.0)
MCH: 26.6 pg (ref 26.0–34.0)
MCHC: 31.5 g/dL (ref 30.0–36.0)
MCV: 84.7 fL (ref 78.0–100.0)
Monocytes Absolute: 1.6 10*3/uL — ABNORMAL HIGH (ref 0.1–1.0)
Monocytes Relative: 11 %
NEUTROS ABS: 9.6 10*3/uL — AB (ref 1.7–7.7)
Neutrophils Relative %: 69 %
Platelets: 349 10*3/uL (ref 150–400)
RBC: 3.79 MIL/uL — ABNORMAL LOW (ref 3.87–5.11)
RDW: 17.6 % — AB (ref 11.5–15.5)
WBC: 14 10*3/uL — ABNORMAL HIGH (ref 4.0–10.5)

## 2017-03-13 LAB — GLUCOSE, CAPILLARY
GLUCOSE-CAPILLARY: 125 mg/dL — AB (ref 65–99)
GLUCOSE-CAPILLARY: 128 mg/dL — AB (ref 65–99)
Glucose-Capillary: 105 mg/dL — ABNORMAL HIGH (ref 65–99)
Glucose-Capillary: 91 mg/dL (ref 65–99)

## 2017-03-13 LAB — BASIC METABOLIC PANEL
ANION GAP: 7 (ref 5–15)
BUN: 20 mg/dL (ref 6–20)
CHLORIDE: 95 mmol/L — AB (ref 101–111)
CO2: 29 mmol/L (ref 22–32)
Calcium: 8 mg/dL — ABNORMAL LOW (ref 8.9–10.3)
Creatinine, Ser: 0.66 mg/dL (ref 0.44–1.00)
GFR calc Af Amer: >60 mL/min (ref >60)
GFR calc non Af Amer: >60 mL/min (ref >60)
GLUCOSE: 128 mg/dL — AB (ref 65–99)
POTASSIUM: 4.4 mmol/L (ref 3.5–5.1)
Sodium: 131 mmol/L — ABNORMAL LOW (ref 135–145)

## 2017-03-13 MED ORDER — ASPIRIN 81 MG PO CHEW
81.0000 mg | CHEWABLE_TABLET | Freq: Every day | ORAL | Status: DC
  Administered 2017-03-13 – 2017-03-17 (×5): 81 mg via ORAL
  Filled 2017-03-13 (×5): qty 1

## 2017-03-13 MED ORDER — LEVALBUTEROL HCL 0.63 MG/3ML IN NEBU
0.6300 mg | INHALATION_SOLUTION | Freq: Four times a day (QID) | RESPIRATORY_TRACT | Status: DC
  Administered 2017-03-13 (×2): 0.63 mg via RESPIRATORY_TRACT
  Filled 2017-03-13 (×2): qty 3

## 2017-03-13 MED ORDER — ATORVASTATIN CALCIUM 40 MG PO TABS
40.0000 mg | ORAL_TABLET | Freq: Every day | ORAL | Status: DC
  Administered 2017-03-13 – 2017-03-16 (×4): 40 mg via ORAL
  Filled 2017-03-13 (×4): qty 1

## 2017-03-13 MED ORDER — IPRATROPIUM BROMIDE 0.02 % IN SOLN: 0.5000 mg | Freq: Four times a day (QID) | RESPIRATORY_TRACT | Status: DC | PRN

## 2017-03-13 NOTE — Progress Notes (Signed)
Progress Note  Patient Name: [name] Date of Encounter: 03/13/2017  Primary Cardiologist: Dr Burt Knack  Subjective   No chest pain; mild dyspnea; complains of leg pain  Inpatient Medications    Scheduled Meds: . acetaminophen  1,000 mg Oral Q8H  . carvedilol  6.25 mg Oral BID WC  . doxycycline  100 mg Oral Q12H  . enoxaparin (LOVENOX) injection  40 mg Subcutaneous Q24H  . feeding supplement (GLUCERNA SHAKE)  237 mL Oral BID BM  . furosemide  40 mg Intravenous BID  . insulin aspart  0-15 Units Subcutaneous TID WC  . insulin aspart  0-5 Units Subcutaneous QHS  . insulin aspart  5 Units Subcutaneous TID WC  . insulin glargine  20 Units Subcutaneous QHS  . levalbuterol  0.63 mg Nebulization Q6H  . mouth rinse  15 mL Mouth Rinse BID  . pantoprazole  40 mg Oral Daily  . polyethylene glycol  17 g Oral Daily  . senna-docusate  1 tablet Oral BID   Continuous Infusions:  PRN Meds: bisacodyl, Gerhardt's butt cream, ipratropium, ondansetron, oxyCODONE   Vital Signs    Vitals:   03/13/17 0547 03/13/17 0549 03/13/17 1128 03/13/17 1454  BP: (!) 117/57 (!) 110/56    Pulse: 80 78    Resp: 20     Temp: 98.6 F (37 C)     TempSrc: Oral     SpO2: 92% 92%  94%  Weight:   159 lb 12.8 oz (72.5 kg)   Height:        Intake/Output Summary (Last 24 hours) at 03/13/2017 1601 Last data filed at 03/13/2017 0859 Gross per 24 hour  Intake 120 ml  Output 950 ml  Net -830 ml   Filed Weights   03/13/17 1128  Weight: 159 lb 12.8 oz (72.5 kg)    Telemetry    Sinus - Personally Reviewed    Physical Exam   GEN: No acute distress.   Neck: No JVD Cardiac: RRR, no murmurs, rubs, or gallops.  Respiratory: Clear to auscultation anteriorly GI: Soft, nontender, non-distended  MS: No edema; s/p repair of hip fx Neuro:  Nonfocal  Psych: Normal affect   Labs    Chemistry Recent Labs  Lab 03/11/17 0238 03/12/17 0422 03/13/17 0332  NA 129* 133* 131*  K 4.3 4.4 4.4  CL 93*  97* 95*  CO2 26 28 29   GLUCOSE 157* 123* 128*  BUN 23* 18 20  CREATININE 0.74 0.67 0.66  CALCIUM 7.7* 8.3* 8.0*  GFRNONAA >60 >60 >60  GFRAA >60 >60 >60  ANIONGAP 10 8 7      Hematology Recent Labs  Lab 03/11/17 0238 03/12/17 0422 03/13/17 0332  WBC 16.2* 15.1* 14.0*  RBC 3.86* 4.02 3.79*  HGB 10.3* 10.8* 10.1*  HCT 32.1* 34.1* 32.1*  MCV 83.2 84.8 84.7  MCH 26.7 26.9 26.6  MCHC 32.1 31.7 31.5  RDW 17.4* 17.7* 17.6*  PLT 308 360 349    Cardiac Enzymes Recent Labs  Lab 03/07/17 0953  TROPONINI 6.87*    Patient Profile     83 year old female admitted with a right hip fracture. Also found to have non-ST elevation myocardial infarction.  Echocardiogram November 2019 showed ejection fraction 20-25%, need to, anteroseptal, inferior and inferoseptal hypokinesis.  There was moderate aortic stenosis and moderate aortic insufficiency.  Moderate mitral regurgitation.  Patient also with progressive anemia and heme positive stools.   Assessment & Plan    1 status post myocardial infarction-plan to add  aspirin 81 mg daily as hemoglobin appears to be stable.  Add Lipitor 40 mg daily.  Continue carvedilol.  Would like for her to improve from her hip and wrist fracture for further evaluation.  Given recent heme positive stool and anemia she has not had cardiac catheterization. Can consider in the future as she improves.  2 ischemic cardiomyopathy-continue beta-blocker.  ARB was discontinued previously because of hypotension.  Will retry later as blood pressure allows.  3 acute systolic congestive heart failure-we will continue present dose of Lasix for now.  Follow renal function.  4 paroxysmal atrial fibrillation-patient apparently had runs of atrial fibrillation previously.  None in the past 24 hours on telemetry.  I will not add anticoagulation given recent heme positive stool and anemia. CHADSvasc 5.  Continue beta-blocker.  5 status post repair of hip fracture-Per primary  care.  For questions or updates, please contact [place] Please consult [email or web address] for contact info under Cardiology/STEMI.      Signed, Kirk Ruths, MD  03/13/2017, 4:01 PM

## 2017-03-13 NOTE — Progress Notes (Signed)
PROGRESS NOTE    [name]  KVQ:[id number] DOB: 07-Feb-1930 DOA: 03/01/2017 PCP: Jinny Sanders, MD     Brief Narrative:  [name] is a 83 y.o. female with medical history significant for diabetes, hypertension, hyperlipidemia, anxiety, GERD, hypothyroidism, presenting after sustaining a mechanical fall onto her right hip when she was reaching over to the commode for the lid. She had right wrist pain and some swelling.  Patient reports trying to go to the bathroom, when she lost balance, with no loss of consciousness, or hitting her head.  She denies any dizziness, presyncope or syncope.  The patient denies any history of seizures.  She denies any chest pain, palpitations, or shortness of breath or cough.  She denies any abdominal pain, nausea or vomiting.  Denies any focal weakness, numbness or tingling.  The patient was immobilized, and a splint was placed on her right wrist as well.  Orthopedic surgery was consulted and patient underwent IM intertrochanteric fixation right hip, ORIF of right wrist 11/21 with Dr. Griffin Basil.  Hospitalization has been complicated by dyspnea, NSTEMI, anemia.  Today, pt noted to be improved, but still slightly dysneic. Reports bilateral heel burning has remained the same. Denies any chest pain, abdominal pain, N/V/D/C, fever/chills.   Assessment & Plan:   Principal Problem:   Closed right hip fracture ([place]) Active Problems:   Secondary DM with neurological manifestations ([place])   Diabetic peripheral neuropathy associated with type 2 diabetes mellitus (HCC)   Hyperlipidemia   GERD   Chronic constipation   Systolic murmur   Falls frequently   Pressure injury of skin   Occult blood positive stool   #Right hip and right wrist fracture after mechanical fall -S/p IM intertrochanteric fixation of right hip, ORIF of right wrist 11/21 with Dr. Griffin Basil -Follow up in 10-14 days. PT OT recommending SNF  Further management by ortho  #NSTEMI /Acute systolic  and diastolic heart failure -Chest pain free  -EKG showed anterolateral ST depression -Cardiology consulted. Known moderate AS and post op tachycardia and anemia may cause subendothelial ischemia. Coreg added.  -CXR small left pleural effusion, atelectasis, cardiomegaly  -BNP 3581  -Trop 7.46 --> 9.43 --> 8.9 --> 9 --> 6.87. No heparin due to anemia. Not candidate for cath  -Echo showed EF 20-25%, severe hypokinesis of mid-apical, anteroseptal, inferior, inferoseptal myocardium, grade 1 diastolic dysfunction  -Lasix 40mg  IV BID, low dose coreg, losartan held due to hypotension -Noted P.Afib on tele as noted by cardiology, but will hold off [facility] at this time due to recent transfusion  -Cardiology following   #Symptomatic acute blood loss anemia -Stable -Baseline Hgb/Hct 13.8/41.9 in August 2018  -Iron, B12 within normal limits, ferritin mildly elevated. Folate pending  -DIC labs, inconsistent with DIC  -LDH elevated 401, no schistocytes on smear  -Transfused 2u pRBC on 11/25  -CT abd/pelvis and CT right femur negative for retroperitoneal bleed or hematoma  -FOBT positive. Consulted GI. Patient had EGD and colonoscopy in the past with [facility], Dr. Henrene Pastor. Plan for outpatient colonoscopy +/- EGD in 2-3 months once acute illnesses resolve   #Leukocytosis -Resolving, trending down slowly -?reactive to post-op stress and pain, acute illnesses as above. UA negative. Blood cultures negative. Afebrile.  -?mild cellulitis vs post-op change read on CT right femur, however, no clinical sign of erythema or infection of right thigh. Incision is clean and dressing dry.  -?proctitis on CT abd/pelvis  -Empiric doxycycline started 11/27 -Continue to trend CBC   #Hyponatremia -Improving -??  Likely 2/2 CHF -Daily BMP  #Hyperkalemia -Resolved   #AKI on CKD stage 3 -Resolved -Avoid nephrotoxin  -Renal US negative for obstruction/hydronephrosis  #DM type 2 -Ha1c 6.6  -Lantus,  SSI  #Hypertension  -Continue Coreg, held losartan  #Abnormal TSH  -TSH low 0.294, Free T4 normal 1.10.  -Repeat TSH/T4 as outpatient once acute illnesses resolve   #Constipation -Resolved   #Sacral and right heel stage 1, not POA  -Wound RN consulted -Turn every 2 hours, offload pressure    DVT prophylaxis: Lovenox  Code Status: full Family Communication None at bedside Disposition Plan: SNF when stable   Consultants:   Orthopedic surgery  Cardiology   GI   Procedures:   Intramedullary nail intertrochanteric on right 11/21   Open reduction internal fixation write on right 11/21  Antimicrobials:  Anti-infectives (From admission, onward)   Start     Dose/Rate Route Frequency Ordered Stop   03/07/17 1200  doxycycline (VIBRA-TABS) tablet 100 mg     100 mg Oral Every 12 hours 03/07/17 1158 03/14/17 0959   03/01/17 0815  ceFAZolin (ANCEF) IVPB 2g/100 mL premix     2 g 200 mL/hr over 30 Minutes Intravenous On call to O.R. 03/01/17 0757 03/01/17 0845       Objective: Vitals:   03/13/17 0547 03/13/17 0549 03/13/17 1128 03/13/17 1454  BP: (!) 117/57 (!) 110/56    Pulse: 80 78    Resp: 20     Temp: 98.6 F (37 C)     TempSrc: Oral     SpO2: 92% 92%  94%  Weight:   72.5 kg (159 lb 12.8 oz)   Height:        Intake/Output Summary (Last 24 hours) at 03/13/2017 1532 Last data filed at 03/13/2017 0859 Gross per 24 hour  Intake 120 ml  Output 950 ml  Net -830 ml   Filed Weights   03/13/17 1128  Weight: 72.5 kg (159 lb 12.8 oz)    Examination:  General exam: Appears calm, slightly dysneic Respiratory system: Clear to auscultation. On [place] O2. Cardiovascular system: S1 & S2 heard, tachycardic, regular rhythm. +murmur  Gastrointestinal system: Abdomen is nondistended, soft and nontender. No organomegaly or masses felt. Normal bowel sounds heard. Central nervous system: Alert and oriented. No focal neurological deficits.  Extremities: +right wrist in  dressing/wrap, +right hip dressing clean, +right thigh without erythema  Skin: +stage 1 right heel  Psychiatry: Judgement and insight appear normal. Mood & affect appropriate.   Data Reviewed: I have personally reviewed following labs and imaging studies  CBC: Recent Labs  Lab 03/09/17 0334 03/10/17 0401 03/11/17 0238 03/12/17 0422 03/13/17 0332  WBC 19.5* 17.5* 16.2* 15.1* 14.0*  NEUTROABS 12.7* 11.6* 11.5* 10.7* 9.6*  HGB 10.0* 10.5* 10.3* 10.8* 10.1*  HCT 31.7* 32.1* 32.1* 34.1* 32.1*  MCV 83.6 84.0 83.2 84.8 84.7  PLT 298 316 308 360 852   Basic Metabolic Panel: Recent Labs  Lab 03/08/17 0314 03/09/17 0334 03/10/17 0401 03/11/17 0238 03/12/17 0422 03/13/17 0332  NA 133* 133* 130* 129* 133* 131*  K 3.7 3.9 3.9 4.3 4.4 4.4  CL 101 97* 96* 93* 97* 95*  CO2 26 27 28 26 28 29   GLUCOSE 67 97 76 157* 123* 128*  BUN 33* 30* 29* 23* 18 20  CREATININE 0.76 0.77 0.73 0.74 0.67 0.66  CALCIUM 7.4* 7.4* 7.5* 7.7* 8.3* 8.0*  MG 1.9  --   --   --   --   --  GFR: Estimated Creatinine Clearance: 49.7 mL/min (by C-G formula based on SCr of 0.66 mg/dL). Liver Function Tests: No results for input(s): AST, ALT, ALKPHOS, BILITOT, PROT, ALBUMIN in the last 168 hours. No results for input(s): LIPASE, AMYLASE in the last 168 hours. No results for input(s): AMMONIA in the last 168 hours. Coagulation Profile: No results for input(s): INR, PROTIME in the last 168 hours. Cardiac Enzymes: Recent Labs  Lab 03/07/17 0953  TROPONINI 6.87*   BNP (last 3 results) No results for input(s): PROBNP in the last 8760 hours. HbA1C: No results for input(s): HGBA1C in the last 72 hours. CBG: Recent Labs  Lab 03/12/17 1228 03/12/17 1654 03/12/17 2341 03/13/17 0820 03/13/17 1225  GLUCAP 156* 160* 112* 125* 128*   Lipid Profile: No results for input(s): CHOL, HDL, LDLCALC, TRIG, CHOLHDL, LDLDIRECT in the last 72 hours. Thyroid Function Tests: No results for input(s): TSH, T4TOTAL,  FREET4, T3FREE, THYROIDAB in the last 72 hours. Anemia Panel: No results for input(s): VITAMINB12, FOLATE, FERRITIN, TIBC, IRON, RETICCTPCT in the last 72 hours. Sepsis Labs: No results for input(s): PROCALCITON, LATICACIDVEN in the last 168 hours.  Recent Results (from the past 240 hour(s))  MRSA PCR Screening     Status: None   Collection Time: 03/05/17  6:37 PM  Result Value Ref Range Status   MRSA by PCR NEGATIVE NEGATIVE Final    Comment:        The GeneXpert MRSA Assay (FDA approved for NASAL specimens only), is one component of a comprehensive MRSA colonization surveillance program. It is not intended to diagnose MRSA infection nor to guide or monitor treatment for MRSA infections.        Radiology Studies: No results found.    Scheduled Meds: . acetaminophen  1,000 mg Oral Q8H  . carvedilol  6.25 mg Oral BID WC  . doxycycline  100 mg Oral Q12H  . enoxaparin (LOVENOX) injection  40 mg Subcutaneous Q24H  . feeding supplement (GLUCERNA SHAKE)  237 mL Oral BID BM  . furosemide  40 mg Intravenous BID  . insulin aspart  0-15 Units Subcutaneous TID WC  . insulin aspart  0-5 Units Subcutaneous QHS  . insulin aspart  5 Units Subcutaneous TID WC  . insulin glargine  20 Units Subcutaneous QHS  . levalbuterol  0.63 mg Nebulization Q6H  . mouth rinse  15 mL Mouth Rinse BID  . pantoprazole  40 mg Oral Daily  . polyethylene glycol  17 g Oral Daily  . senna-docusate  1 tablet Oral BID   Continuous Infusions:    LOS: 12 days    Time spent: 40 minutes   Alma Friendly, MD [facility] [email or web address] Password [facility] 03/13/2017, 3:32 PM

## 2017-03-13 NOTE — Progress Notes (Signed)
Occupational Therapy Treatment Patient Details Name: [name] MRN: [id number] DOB: 1929-07-20 Today's Date: 03/13/2017    History of present illness [name] Berrocal Gilbertis a 83 y.o.femalewith medical history significantfor diabetes, hypertension, hyperlipidemia, anxiety, GERD, hypothyroidism, presenting after sustaining a mechanical fall onto her right hip. S/p IM intertrochanteric fixation of right hip, ORIF of right wrist 11/21.   OT comments  Pt progressing towards goals. Completed stand pivot to [facility] with MaxA +2 this session; utilized [place] for transfer to recliner from [facility] with MaxA +2 for sit<>stand at [place]. Pt required total assist for peri-care during toileting and currently requires total assist for LB ADLs. Feel SNF level services remains appropriate recommendation to maximize Pt's safety and independence with ADLs and mobility after discharge. Will continue to follow acutely.    Follow Up Recommendations  SNF;Supervision/Assistance - 24 hour    Equipment Recommendations  Other (comment)(defer to next venue )          Precautions / Restrictions Precautions Precautions: Fall Restrictions Weight Bearing Restrictions: Yes RUE Weight Bearing: Weight bear through elbow only RLE Weight Bearing: Weight bearing as tolerated Other Position/Activity Restrictions: no ROM at R wrist       Mobility Bed Mobility Overal bed mobility: Needs Assistance Bed Mobility: Supine to Sit     Supine to sit: Max assist;+2 for physical assistance;HOB elevated     General bed mobility comments: Pt required assist for RLE management over EOB and to bring trunk upright into sitting  Transfers Overall transfer level: Needs assistance Equipment used: 2 person hand held assist Transfers: Sit to/from [employer] Transfers Sit to Stand: Max assist;+2 physical assistance;+2 safety/equipment Stand pivot transfers: Max assist;+2 physical assistance;+2 safety/equipment       General  transfer comment: Pt completed stand pivot EOB>BSC with MaxA +2 (HHA) with assist to rise and pivot towards L side; use of stedy for transfer BSC to recliner with Pt requiring MaxA +2 for sit<>stand, increased assist required to boost into standing at [facility] due to fatigue     Balance Overall balance assessment: Needs assistance Sitting-balance support: Single extremity supported;Feet supported Sitting balance-Leahy Scale: Poor Sitting balance - Comments: MinGuard-MinA for static sitting EOB; Required UE support or physical assistance.     Standing balance-Leahy Scale: Poor Standing balance comment: reliant on UE support/external support from therapist                           ADL either performed or assessed with clinical judgement   ADL Overall ADL's : Needs assistance/impaired                     Lower Body Dressing: Total assistance;+2 for physical assistance;+2 for safety/equipment;Sit to/from stand;Bed level Lower Body Dressing Details (indicate cue type and reason): total assist to doff mesh briefs (briefs noted to be soiled at start of session)  Toilet Transfer: Maximal assistance;+2 for safety/equipment;+2 for physical assistance;BSC;Stand-pivot   Toileting- Clothing Manipulation and Hygiene: +2 for physical assistance;+2 for safety/equipment;Sit to/from stand;Total assistance Toileting - Clothing Manipulation Details (indicate cue type and reason): after transfer to [facility], Pt required total assist for peri-care after BM while standing in stedy, Pt incontinent of urine during transfer from [facility] to recliner using Stedy requiring additional hygiene/care      Functional mobility during ADLs: Maximal assistance;+2 for physical assistance;+2 for safety/equipment General ADL Comments: Pt completed stand pivot transfer EOB>BSC with MaxA +2 (HHA); utilized [place] for transfer from [facility]  to recliner with MaxA +2 for sit<>stand                       Cognition  Arousal/Alertness: Awake/alert Behavior During Therapy: Anxious Overall Cognitive Status: Within Functional Limits for tasks assessed                                                            Pertinent Vitals/ Pain       Pain Assessment: Faces Faces Pain Scale: Hurts whole lot Pain Location: RLE with movement  Pain Descriptors / Indicators: Discomfort;Guarding;Grimacing;Moaning;Tender Pain Intervention(s): Limited activity within patient's tolerance;Monitored during session;Repositioned                                                          Frequency  Min 2X/week        Progress Toward Goals  OT Goals(current goals can now be found in the care plan section)  Progress towards OT goals: Progressing toward goals  Acute Rehab OT Goals Patient Stated Goal: to decrease pain OT Goal Formulation: With patient Time For Goal Achievement: 03/16/17 Potential to Achieve Goals: [place] Discharge plan remains appropriate                    AM-PAC PT "6 Clicks" Daily Activity     Outcome Measure   Help from another person eating meals?: A Little Help from another person taking care of personal grooming?: A Little Help from another person toileting, which includes using toliet, bedpan, or urinal?: Total Help from another person bathing (including washing, rinsing, drying)?: A Lot Help from another person to put on and taking off regular upper body clothing?: A Lot Help from another person to put on and taking off regular lower body clothing?: Total 6 Click Score: 12    End of Session Equipment Utilized During Treatment: Oxygen  OT Visit Diagnosis: Unsteadiness on feet (R26.81);Other abnormalities of gait and mobility (R26.89);Repeated falls (R29.6);History of falling (Z91.81);Muscle weakness (generalized) (M62.81);Pain Pain - Right/Left: Right Pain - part of body: Arm;Hand;Leg   Activity Tolerance Patient tolerated  treatment well;Patient limited by fatigue   Patient Left in chair;with call bell/phone within reach   Nurse Communication Mobility status        Time: 4709-6283 OT Time Calculation (min): 41 min  Charges: OT General Charges $OT Visit: 1 Visit OT Treatments $Self Care/Home Management : 23-37 mins  Lou Cal, OT Pager [phone] 03/13/2017    [name] 03/13/2017, 11:59 AM

## 2017-03-14 ENCOUNTER — Inpatient Hospital Stay (HOSPITAL_COMMUNITY): Payer: PPO

## 2017-03-14 LAB — CBC WITH DIFFERENTIAL/PLATELET
BASOS PCT: 0 %
Basophils Absolute: 0 10*3/uL (ref 0.0–0.1)
Eosinophils Absolute: 0.4 10*3/uL (ref 0.0–0.7)
Eosinophils Relative: 3 %
HEMATOCRIT: 32.5 % — AB (ref 36.0–46.0)
Hemoglobin: 10.1 g/dL — ABNORMAL LOW (ref 12.0–15.0)
Lymphocytes Relative: 24 %
Lymphs Abs: 2.8 10*3/uL (ref 0.7–4.0)
MCH: 26.4 pg (ref 26.0–34.0)
MCHC: 31.1 g/dL (ref 30.0–36.0)
MCV: 84.9 fL (ref 78.0–100.0)
MONO ABS: 1.5 10*3/uL — AB (ref 0.1–1.0)
MONOS PCT: 13 %
NEUTROS ABS: 7 10*3/uL (ref 1.7–7.7)
Neutrophils Relative %: 60 %
Platelets: 336 10*3/uL (ref 150–400)
RBC: 3.83 MIL/uL — ABNORMAL LOW (ref 3.87–5.11)
RDW: 17.8 % — ABNORMAL HIGH (ref 11.5–15.5)
WBC: 11.7 10*3/uL — ABNORMAL HIGH (ref 4.0–10.5)

## 2017-03-14 LAB — BASIC METABOLIC PANEL
Anion gap: 9 (ref 5–15)
BUN: 21 mg/dL — AB (ref 6–20)
CO2: 28 mmol/L (ref 22–32)
CREATININE: 0.66 mg/dL (ref 0.44–1.00)
Calcium: 8.1 mg/dL — ABNORMAL LOW (ref 8.9–10.3)
Chloride: 94 mmol/L — ABNORMAL LOW (ref 101–111)
GFR calc non Af Amer: >60 mL/min (ref >60)
Glucose, Bld: 87 mg/dL (ref 65–99)
Potassium: 4.4 mmol/L (ref 3.5–5.1)
Sodium: 131 mmol/L — ABNORMAL LOW (ref 135–145)

## 2017-03-14 LAB — GLUCOSE, CAPILLARY
GLUCOSE-CAPILLARY: 136 mg/dL — AB (ref 65–99)
Glucose-Capillary: 93 mg/dL (ref 65–99)
Glucose-Capillary: 166 mg/dL — ABNORMAL HIGH (ref 65–99)
Glucose-Capillary: 99 mg/dL (ref 65–99)

## 2017-03-14 MED ORDER — LOSARTAN POTASSIUM 50 MG PO TABS
25.0000 mg | ORAL_TABLET | Freq: Every day | ORAL | Status: DC
  Administered 2017-03-14 – 2017-03-17 (×4): 25 mg via ORAL
  Filled 2017-03-14 (×3): qty 1

## 2017-03-14 MED ORDER — LEVALBUTEROL HCL 0.63 MG/3ML IN NEBU: 0.6300 mg | INHALATION_SOLUTION | Freq: Four times a day (QID) | RESPIRATORY_TRACT | Status: DC | PRN

## 2017-03-14 NOTE — Progress Notes (Signed)
Physical Therapy Treatment Patient Details Name: [name] MRN: [id number] DOB: [birth year] Today's Date: 03/14/2017    History of Present Illness [name] a 83 y.o.femalewith medical history significantfor diabetes, hypertension, hyperlipidemia, anxiety, GERD, hypothyroidism, presenting after sustaining a mechanical fall onto her right hip. S/p IM intertrochanteric fixation of right hip, ORIF of right wrist 11/21.    PT Comments    Pt is up to side of bed with supportive family in attendance and talked about her challenges with therapy. Pt is very appropriate for SNF still, so will work toward a tolerance of standing balance and gait with LRAD to progress as tolerated.   Follow Up Recommendations  SNF     Equipment Recommendations  Wheelchair (measurements PT)    Recommendations for Other Services       Precautions / Restrictions Precautions Precautions: Fall Restrictions Weight Bearing Restrictions: Yes RUE Weight Bearing: Weight bear through elbow only RLE Weight Bearing: Weight bearing as tolerated Other Position/Activity Restrictions: no ROM at R wrist    Mobility  Bed Mobility Overal bed mobility: Needs Assistance Bed Mobility: Supine to Sit Rolling: Max assist   Supine to sit: Max assist Sit to supine: Total assist   General bed mobility comments: Assisted RLE with transition in and out of bed  Transfers Overall transfer level: Needs assistance               General transfer comment: refused  Ambulation/Gait             General Gait Details: refused   Stairs            Wheelchair Mobility    Modified Rankin (Stroke Patients Only)       Balance     Sitting balance-Leahy Scale: Poor Sitting balance - Comments: MinGuard-MinA for static sitting EOB; Required UE support or physical assistance. Postural control: Left lateral lean                                  Cognition Arousal/Alertness:  Awake/alert Behavior During Therapy: Anxious Overall Cognitive Status: Within Functional Limits for tasks assessed                                 General Comments: Pt very worried about overdoing and uncomfortable with RUE in bed and moving RLE out of bed      Exercises General Exercises - Lower Extremity Ankle Circles/Pumps: AROM;Both;10 reps Quad Sets: AROM;Both;10 reps Short Arc Quad: AROM;AAROM;Both;10 reps Long Arc Quad: AROM;AAROM;Both;10 reps Heel Slides: AAROM;AROM;Both;10 reps    General Comments        Pertinent Vitals/Pain Pain Assessment: Faces Faces Pain Scale: Hurts whole lot Pain Location: RLE with movement  Pain Descriptors / Indicators: Discomfort;Guarding;Grimacing;Moaning;Tender Pain Intervention(s): Limited activity within patient's tolerance;Monitored during session;Premedicated before session;Repositioned    Home Living                      Prior Function            PT Goals (current goals can now be found in the care plan section) Progress towards PT goals: Progressing toward goals    Frequency    Min 3X/week      PT Plan Current plan remains appropriate    Co-evaluation              AM-PAC  PT "6 Clicks" Daily Activity  Outcome Measure  Difficulty turning over in bed (including adjusting bedclothes, sheets and blankets)?: Unable Difficulty moving from lying on back to sitting on the side of the bed? : Unable Difficulty sitting down on and standing up from a chair with arms (e.g., wheelchair, bedside commode, etc,.)?: Unable Help needed moving to and from a bed to chair (including a wheelchair)?: A Lot Help needed walking in hospital room?: A Lot Help needed climbing 3-5 steps with a railing? : Total 6 Click Score: 8    End of Session Equipment Utilized During Treatment: Gait belt;Oxygen Activity Tolerance: Patient limited by pain Patient left: in bed;with call bell/phone within reach;with bed alarm  set Nurse Communication: Mobility status PT Visit Diagnosis: Unsteadiness on feet (R26.81);Other abnormalities of gait and mobility (R26.89);History of falling (Z91.81);Muscle weakness (generalized) (M62.81);Pain Pain - Right/Left: Right Pain - part of body: Leg;Arm     Time: 8638-1771 PT Time Calculation (min) (ACUTE ONLY): 35 min  Charges:  $Therapeutic Exercise: 8-22 mins $Therapeutic Activity: 8-22 mins                    G Codes:  Functional Assessment Tool Used: AM-PAC 6 Clicks Basic Mobility    Ramond Dial 03/14/2017, 9:21 PM   Mee Hives, PT MS Acute Rehab Dept. Number: [place] and [place]

## 2017-03-14 NOTE — Progress Notes (Signed)
PROGRESS NOTE    [name]  EPP:[id number] DOB: 02/19/1930 DOA: 03/01/2017 PCP: Jinny Sanders, MD     Brief Narrative:  [name] is a 83 y.o. female with medical history significant for diabetes, hypertension, hyperlipidemia, anxiety, GERD, hypothyroidism, presenting after sustaining a mechanical fall onto her right hip when she was reaching over to the commode for the lid. She had right wrist pain and some swelling.  Patient reports trying to go to the bathroom, when she lost balance, with no loss of consciousness, or hitting her head.  She denies any dizziness, presyncope or syncope.  The patient denies any history of seizures.  She denies any chest pain, palpitations, or shortness of breath or cough.  She denies any abdominal pain, nausea or vomiting.  Denies any focal weakness, numbness or tingling.  The patient was immobilized, and a splint was placed on her right wrist as well.  Orthopedic surgery was consulted and patient underwent IM intertrochanteric fixation right hip, ORIF of right wrist 11/21 with Dr. Griffin Basil.  Hospitalization has been complicated by dyspnea, NSTEMI, anemia.  Today, pt still dysneic. Reports bilateral heel burning has remained the same. Denies any chest pain, abdominal pain, N/V/D/C, fever/chills. Had a good bowel movement after soap sud enema  Assessment & Plan:   Principal Problem:   Closed right hip fracture ([place]) Active Problems:   Secondary DM with neurological manifestations ([place])   Diabetic peripheral neuropathy associated with type 2 diabetes mellitus ([place])   Hyperlipidemia   GERD   Chronic constipation   Systolic murmur   Falls frequently   Pressure injury of skin   Occult blood positive stool   Acute systolic congestive heart failure (H[facility])   #Right hip and right wrist fracture after mechanical fall -S/p IM intertrochanteric fixation of right hip, ORIF of right wrist 11/21 with Dr. Griffin Basil -Follow up in 10-14 days. PT OT recommending SNF   Further management by ortho  #NSTEMI /Acute systolic and diastolic heart failure -Chest pain free  -EKG showed anterolateral ST depression -Cardiology consulted. Known moderate AS and post op tachycardia and anemia may cause subendothelial ischemia. Coreg added.  -CXR small left pleural effusion, atelectasis, cardiomegaly  -BNP 3581  -Trop 7.46 --> 9.43 --> 8.9 --> 9 --> 6.87. No heparin due to anemia. Not candidate for cath  -Echo showed EF 20-25%, severe hypokinesis of mid-apical, anteroseptal, inferior, inferoseptal myocardium, grade 1 diastolic dysfunction  -Lasix 40mg  IV BID, low dose coreg, losartan held due to hypotension -Noted P.Afib on tele as noted by cardiology, but will hold off AC at this time due to recent transfusion  -Cardiology following: Due to persistent dyspnea, repeat CXR on 12/4 showed mild interstitial edema, stable small bilateral pleural effusions, no PNA  #Symptomatic acute blood loss anemia -Stable -Baseline Hgb/Hct 13.8/41.9 in August 2018  -Iron, B12 within normal limits, ferritin mildly elevated. Folate pending  -DIC labs, inconsistent with DIC  -LDH elevated 401, no schistocytes on smear  -Transfused 2u pRBC on 11/25  -CT abd/pelvis and CT right femur negative for retroperitoneal bleed or hematoma  -FOBT positive. Consulted GI. Patient had EGD and colonoscopy in the past with [facility], Dr. Henrene Pastor. Plan for outpatient colonoscopy +/- EGD in 2-3 months once acute illnesses resolve   #Leukocytosis -Resolving, trending down, almost back to normal -?reactive to post-op stress and pain, acute illnesses as above. UA negative. Blood cultures negative. Afebrile.  -?mild cellulitis vs post-op change read on CT right femur, however, no clinical sign  of erythema or infection of right thigh. Incision is clean and dressing dry.  -?proctitis on CT abd/pelvis  -Empiric doxycycline started 11/27, completed today 12/4 -Continue to trend CBC    #Hyponatremia -Improving -?? Likely 2/2 CHF -Daily BMP  #Hyperkalemia -Resolved   #AKI on CKD stage 3 -Resolved -Avoid nephrotoxin  -Renal US negative for obstruction/hydronephrosis  #DM type 2 -Ha1c 6.6  -Lantus, SSI  #Hypertension  -Continue Coreg, held losartan  #Abnormal TSH  -TSH low 0.294, Free T4 normal 1.10.  -Repeat TSH/T4 as outpatient once acute illnesses resolve   #Constipation -Resolved   #Sacral and right heel stage 1, not POA  -Wound RN consulted -Turn every 2 hours, offload pressure    DVT prophylaxis: Lovenox  Code Status: full Family Communication None at bedside Disposition Plan: SNF when stable   Consultants:   Orthopedic surgery  Cardiology   GI   Procedures:   Intramedullary nail intertrochanteric on right 11/21   Open reduction internal fixation write on right 11/21  Antimicrobials:  Anti-infectives (From admission, onward)   Start     Dose/Rate Route Frequency Ordered Stop   03/07/17 1200  doxycycline (VIBRA-TABS) tablet 100 mg     100 mg Oral Every 12 hours 03/07/17 1158 03/13/17 2113   03/01/17 0815  ceFAZolin (ANCEF) IVPB 2g/100 mL premix     2 g 200 mL/hr over 30 Minutes Intravenous On call to O.R. 03/01/17 0757 03/01/17 0845       Objective: Vitals:   03/13/17 1948 03/13/17 2211 03/14/17 0551 03/14/17 1353  BP:  (!) 101/41 114/62 (!) 108/47  Pulse: 77 73 73 73  Resp: 18 18 18 17   Temp:  98.8 F (37.1 C) 97.9 F (36.6 C) 97.8 F (36.6 C)  TempSrc:   Oral Oral  SpO2: 97% 98% 99% 97%  Weight:      Height:        Intake/Output Summary (Last 24 hours) at 03/14/2017 1417 Last data filed at 03/14/2017 1355 Gross per 24 hour  Intake 240 ml  Output 1300 ml  Net -1060 ml   Filed Weights   03/13/17 1128  Weight: 72.5 kg (159 lb 12.8 oz)    Examination:  General exam: Appears calm, slightly dysneic Respiratory system: Clear to auscultation. On [place] O2. Cardiovascular system: S1 & S2 heard, tachycardic,  regular rhythm. +murmur  Gastrointestinal system: Abdomen is nondistended, soft and nontender. No organomegaly or masses felt. Normal bowel sounds heard. Central nervous system: Alert and oriented. No focal neurological deficits.  Extremities: +right wrist in dressing/wrap, +right hip dressing clean, +right thigh without erythema  Skin: +stage 1 right heel  Psychiatry: Judgement and insight appear normal. Mood & affect appropriate.   Data Reviewed: I have personally reviewed following labs and imaging studies  CBC: Recent Labs  Lab 03/10/17 0401 03/11/17 0238 03/12/17 0422 03/13/17 0332 03/14/17 0401  WBC 17.5* 16.2* 15.1* 14.0* 11.7*  NEUTROABS 11.6* 11.5* 10.7* 9.6* 7.0  HGB 10.5* 10.3* 10.8* 10.1* 10.1*  HCT 32.1* 32.1* 34.1* 32.1* 32.5*  MCV 84.0 83.2 84.8 84.7 84.9  PLT 316 308 360 349 761   Basic Metabolic Panel: Recent Labs  Lab 03/08/17 0314  03/10/17 0401 03/11/17 0238 03/12/17 0422 03/13/17 0332 03/14/17 0401  NA 133*   < > 130* 129* 133* 131* 131*  K 3.7   < > 3.9 4.3 4.4 4.4 4.4  CL 101   < > 96* 93* 97* 95* 94*  CO2 26   < > 28 26  28 29 28   GLUCOSE 67   < > 76 157* 123* 128* 87  BUN 33*   < > 29* 23* 18 20 21*  CREATININE 0.76   < > 0.73 0.74 0.67 0.66 0.66  CALCIUM 7.4*   < > 7.5* 7.7* 8.3* 8.0* 8.1*  MG 1.9  --   --   --   --   --   --    < > = values in this interval not displayed.   GFR: Estimated Creatinine Clearance: 49.7 mL/min (by C-G formula based on SCr of 0.66 mg/dL). Liver Function Tests: No results for input(s): AST, ALT, ALKPHOS, BILITOT, PROT, ALBUMIN in the last 168 hours. No results for input(s): LIPASE, AMYLASE in the last 168 hours. No results for input(s): AMMONIA in the last 168 hours. Coagulation Profile: No results for input(s): INR, PROTIME in the last 168 hours. Cardiac Enzymes: No results for input(s): CKTOTAL, CKMB, CKMBINDEX, TROPONINI in the last 168 hours. BNP (last 3 results) No results for input(s): PROBNP in the last  8760 hours. HbA1C: No results for input(s): HGBA1C in the last 72 hours. CBG: Recent Labs  Lab 03/13/17 1225 03/13/17 1712 03/13/17 2102 03/14/17 0804 03/14/17 1205  GLUCAP 128* 105* 91 93 136*   Lipid Profile: No results for input(s): CHOL, HDL, LDLCALC, TRIG, CHOLHDL, LDLDIRECT in the last 72 hours. Thyroid Function Tests: No results for input(s): TSH, T4TOTAL, FREET4, T3FREE, THYROIDAB in the last 72 hours. Anemia Panel: No results for input(s): VITAMINB12, FOLATE, FERRITIN, TIBC, IRON, RETICCTPCT in the last 72 hours. Sepsis Labs: No results for input(s): PROCALCITON, LATICACIDVEN in the last 168 hours.  Recent Results (from the past 240 hour(s))  MRSA PCR Screening     Status: None   Collection Time: 03/05/17  6:37 PM  Result Value Ref Range Status   MRSA by PCR NEGATIVE NEGATIVE Final    Comment:        The GeneXpert MRSA Assay (FDA approved for NASAL specimens only), is one component of a comprehensive MRSA colonization surveillance program. It is not intended to diagnose MRSA infection nor to guide or monitor treatment for MRSA infections.        Radiology Studies: Dg Chest Port 1 View  Result Date: 03/14/2017 CLINICAL DATA:  Patient sustained an MI 2 weeks ago following hip surgery. The patient reports onset of shortness of breath and chest congestion with nonproductive cough. History of diabetes, CHF, former smoker. EXAM: PORTABLE CHEST 1 VIEW COMPARISON:  Portable chest x-ray of March 11, 2017 FINDINGS: The lungs are well-expanded. Small bilateral pleural effusions persist. The interstitial markings remain increased but are slightly less conspicuous today. The cardiac silhouette remains enlarged. The pulmonary vascularity is mildly prominent centrally. There is calcification in the wall of the thoracic aorta. IMPRESSION: CHF with mild interstitial edema improved since yesterday's study. Stable small bilateral pleural effusions. No overt pneumonia. Thoracic  aortic atherosclerosis. Electronically Signed   By: David  Martinique M.D.   On: 03/14/2017 13:49      Scheduled Meds: . acetaminophen  1,000 mg Oral Q8H  . aspirin  81 mg Oral Daily  . atorvastatin  40 mg Oral q1800  . carvedilol  6.25 mg Oral BID WC  . enoxaparin (LOVENOX) injection  40 mg Subcutaneous Q24H  . feeding supplement (GLUCERNA SHAKE)  237 mL Oral BID BM  . furosemide  40 mg Intravenous BID  . insulin aspart  0-15 Units Subcutaneous TID WC  . insulin aspart  0-5 Units Subcutaneous  QHS  . insulin aspart  5 Units Subcutaneous TID WC  . insulin glargine  20 Units Subcutaneous QHS  . losartan  25 mg Oral Daily  . mouth rinse  15 mL Mouth Rinse BID  . pantoprazole  40 mg Oral Daily  . polyethylene glycol  17 g Oral Daily  . senna-docusate  1 tablet Oral BID   Continuous Infusions:    LOS: 13 days    Time spent: 40 minutes   Alma Friendly, MD [facility] [email or web address] Password [email or web address] 03/14/2017, 2:17 PM

## 2017-03-14 NOTE — Care Management Important Message (Signed)
Important Message  Patient Details  Name: [name] MRN: [id number] Date of Birth: [birth year]   Medicare Important Message Given:  Yes    Karalee Hauter [place] 03/14/2017, 9:12 AM

## 2017-03-14 NOTE — Progress Notes (Signed)
Progress Note  Patient Name: [name] Date of Encounter: 03/14/2017  Primary Cardiologist: Dr Burt Knack  Subjective   Complains of dyspnea; no chest pain  Inpatient Medications    Scheduled Meds: . acetaminophen  1,000 mg Oral Q8H  . aspirin  81 mg Oral Daily  . atorvastatin  40 mg Oral q1800  . carvedilol  6.25 mg Oral BID WC  . enoxaparin (LOVENOX) injection  40 mg Subcutaneous Q24H  . feeding supplement (GLUCERNA SHAKE)  237 mL Oral BID BM  . furosemide  40 mg Intravenous BID  . insulin aspart  0-15 Units Subcutaneous TID WC  . insulin aspart  0-5 Units Subcutaneous QHS  . insulin aspart  5 Units Subcutaneous TID WC  . insulin glargine  20 Units Subcutaneous QHS  . mouth rinse  15 mL Mouth Rinse BID  . pantoprazole  40 mg Oral Daily  . polyethylene glycol  17 g Oral Daily  . senna-docusate  1 tablet Oral BID   Continuous Infusions:  PRN Meds: bisacodyl, Gerhardt's butt cream, ipratropium, levalbuterol, ondansetron, oxyCODONE   Vital Signs    Vitals:   03/13/17 1454 03/13/17 1948 03/13/17 2211 03/14/17 0551  BP:   (!) 101/41 114/62  Pulse:  77 73 73  Resp:  18 18 18   Temp:   98.8 F (37.1 C) 97.9 F (36.6 C)  TempSrc:    Oral  SpO2: 94% 97% 98% 99%  Weight:      Height:        Intake/Output Summary (Last 24 hours) at 03/14/2017 1254 Last data filed at 03/14/2017 1049 Gross per 24 hour  Intake 240 ml  Output 900 ml  Net -660 ml   Filed Weights   03/13/17 1128  Weight: 159 lb 12.8 oz (72.5 kg)    Telemetry    Sinus - Personally Reviewed    Physical Exam   GEN: WD/WN No acute distress.   Neck: supple Cardiac: RRR Respiratory: Diminished BS bases GI: Soft, mildly distended MS: No edema Neuro: Grossly intact   Labs    Chemistry Recent Labs  Lab 03/12/17 0422 03/13/17 0332 03/14/17 0401  NA 133* 131* 131*  K 4.4 4.4 4.4  CL 97* 95* 94*  CO2 28 29 28   GLUCOSE 123* 128* 87  BUN 18 20 21*  CREATININE 0.67 0.66 0.66  CALCIUM  8.3* 8.0* 8.1*  GFRNONAA >60 >60 >60  GFRAA >60 >60 >60  ANIONGAP 8 7 9      Hematology Recent Labs  Lab 03/12/17 0422 03/13/17 0332 03/14/17 0401  WBC 15.1* 14.0* 11.7*  RBC 4.02 3.79* 3.83*  HGB 10.8* 10.1* 10.1*  HCT 34.1* 32.1* 32.5*  MCV 84.8 84.7 84.9  MCH 26.9 26.6 26.4  MCHC 31.7 31.5 31.1  RDW 17.7* 17.6* 17.8*  PLT 360 349 336    Cardiac Enzymes No results for input(s): TROPONINI in the last 168 hours.  Patient Profile     83 year old female admitted with a right hip fracture. Also found to have non-ST elevation myocardial infarction.  Echocardiogram November 2019 showed ejection fraction 20-25%, need to, anteroseptal, inferior and inferoseptal hypokinesis.  There was moderate aortic stenosis and moderate aortic insufficiency.  Moderate mitral regurgitation.  Patient also with progressive anemia and heme positive stools.   Assessment & Plan    1 status post myocardial infarction-Continue ASA, statin and carvedilol.  Would like for her to improve from her hip and wrist fracture prior to further cardiac evaluation.  Given recent  heme positive stool and anemia she has not had cardiac catheterization. Can consider in the future as she improves.  2 ischemic cardiomyopathy-continue beta-blocker.  Will try low dose cozaar again to see if BP tolerates.  3 acute systolic congestive heart failure-we will continue present dose of Lasix for now.  Follow renal function. With diminished BS will repeat chest xray to R/O effusion and need for thoracentesis.  4 paroxysmal atrial fibrillation-patient apparently had runs of atrial fibrillation previously.  Remains in sinus at present.  I will not add anticoagulation given recent heme positive stool and anemia; this may have also been postoperative atrial fibrillation. CHADSvasc 5.  Continue beta-blocker.  5 status post repair of hip fracture-Per primary care.  For questions or updates, please contact [place] Please consult  [email or web address] for contact info under Cardiology/STEMI.      Signed, Kirk Ruths, MD  03/14/2017, 12:54 PM

## 2017-03-15 ENCOUNTER — Inpatient Hospital Stay (HOSPITAL_COMMUNITY): Payer: PPO

## 2017-03-15 DIAGNOSIS — N179 Acute kidney failure, unspecified: Secondary | ICD-10-CM

## 2017-03-15 DIAGNOSIS — I5033 Acute on chronic diastolic (congestive) heart failure: Secondary | ICD-10-CM

## 2017-03-15 DIAGNOSIS — S52501A Unspecified fracture of the lower end of right radius, initial encounter for closed fracture: Secondary | ICD-10-CM

## 2017-03-15 DIAGNOSIS — E1142 Type 2 diabetes mellitus with diabetic polyneuropathy: Secondary | ICD-10-CM

## 2017-03-15 LAB — CBC WITH DIFFERENTIAL/PLATELET
BASOS PCT: 0 %
Basophils Absolute: 0 10*3/uL (ref 0.0–0.1)
EOS ABS: 0.3 10*3/uL (ref 0.0–0.7)
Eosinophils Relative: 3 %
HEMATOCRIT: 34.7 % — AB (ref 36.0–46.0)
Hemoglobin: 10.8 g/dL — ABNORMAL LOW (ref 12.0–15.0)
Lymphocytes Relative: 21 %
Lymphs Abs: 2.4 10*3/uL (ref 0.7–4.0)
MCH: 26.5 pg (ref 26.0–34.0)
MCHC: 31.1 g/dL (ref 30.0–36.0)
MCV: 85.3 fL (ref 78.0–100.0)
MONO ABS: 1.8 10*3/uL — AB (ref 0.1–1.0)
MONOS PCT: 16 %
Neutro Abs: 6.9 10*3/uL (ref 1.7–7.7)
Neutrophils Relative %: 60 %
Platelets: 381 10*3/uL (ref 150–400)
RBC: 4.07 MIL/uL (ref 3.87–5.11)
RDW: 17.6 % — AB (ref 11.5–15.5)
WBC: 11.4 10*3/uL — ABNORMAL HIGH (ref 4.0–10.5)

## 2017-03-15 LAB — BASIC METABOLIC PANEL
Anion gap: 10 (ref 5–15)
BUN: 18 mg/dL (ref 6–20)
CALCIUM: 8.3 mg/dL — AB (ref 8.9–10.3)
CO2: 28 mmol/L (ref 22–32)
CREATININE: 0.64 mg/dL (ref 0.44–1.00)
Chloride: 95 mmol/L — ABNORMAL LOW (ref 101–111)
GFR calc non Af Amer: >60 mL/min (ref >60)
Glucose, Bld: 99 mg/dL (ref 65–99)
Potassium: 4.3 mmol/L (ref 3.5–5.1)
SODIUM: 133 mmol/L — AB (ref 135–145)

## 2017-03-15 LAB — GLUCOSE, CAPILLARY
GLUCOSE-CAPILLARY: 109 mg/dL — AB (ref 65–99)
GLUCOSE-CAPILLARY: 152 mg/dL — AB (ref 65–99)
GLUCOSE-CAPILLARY: 89 mg/dL (ref 65–99)
GLUCOSE-CAPILLARY: 88 mg/dL (ref 65–99)

## 2017-03-15 MED ORDER — FUROSEMIDE 10 MG/ML IJ SOLN
40.0000 mg | Freq: Once | INTRAMUSCULAR | Status: AC
  Administered 2017-03-15: 40 mg via INTRAVENOUS
  Filled 2017-03-15: qty 4

## 2017-03-15 NOTE — Progress Notes (Signed)
Orthopedic Tech Progress Note Patient Details:  [name] December 13, 1929 [id number]  Ortho Devices Type of Ortho Device: Velcro wrist splint Ortho Device/Splint Location: rue Ortho Device/Splint Interventions: Application   Post Interventions Patient Tolerated: Well Instructions Provided: Care of device   Hildred Priest 03/15/2017, 11:35 AM

## 2017-03-15 NOTE — Consult Note (Signed)
[place] Nurse wound follow up Wound type:pressure injury sacrum and right heel Measurement: right heel has 3cm round reddened area with a 1cm x 2cm dark maroon discoloration in center. Left medial buttock just below sacrum has 2.5cm round partial thickness, pale pink wound bed with red epithelia buds. Left heel has a stage I non blanchable area 3cm round. Wound bed: see above Drainage (amount, consistency, odor) no drainage or odor. Periwound: intact and dry Dressing procedure/placement/frequency: Continue with off loading and foam to heels and sacrum/buttock areas. Pt did not have Prevalon Boot on left foot. I have placed her left heel in boot and will return in an hour to re-assess left heel. Patient able to tell me importance of off loading pressure from back and heels. Have asked nursing to be sure to use both boots, not just right. Will reassess weekly while in house.  Please re-consult if we need to assist further before the weekly rounding.   Fara Olden, RN-C, WTA-C Wound Treatment Associate

## 2017-03-15 NOTE — Progress Notes (Signed)
ORTHOPAEDIC PROGRESS NOTE  s/p Procedure(s): INTRAMEDULLARY (IM) NAIL INTERTROCHANTRIC OPEN REDUCTION INTERNAL FIXATION (ORIF) WRIST FRACTURE  SUBJECTIVE: 2 week follow-up performed today rather than clinic as patient still hospitalized.  Still reports some pain in operative sites but overall feels that she is improving.    OBJECTIVE: PE:RUE: wwp hand, distal motor and sensory preserved, splint CDI RLE: incision CDI,warm well perfused foot, intact EHL/TA/GSC   Vitals:   03/15/17 0204 03/15/17 0558  BP: (!) 97/43 (!) 99/49  Pulse: 68 66  Resp: 18 18  Temp: 98.1 F (36.7 C) 98.4 F (36.9 C)  SpO2: 98% 100%     ASSESSMENT: [name] is a 83 y.o. female doing well postoperatively.  PLAN: Weightbearing: WBAT RLE, WBAT R elbow, ok for <1lb weight bearing in hand.  OK to start ROM with therapy in R wrist and hand.   Insicional and dressing care: Incisions open to air unless patient desires dry dressing.   Orthopedic device(s): Transition to R wrist velcro cock-up splint.  Use at all times except for hygeine for next 4 weeks. Showering: OK to shower without brace on wrist VTE prophylaxis: Lovenox 40mg  qd Additional 4 weeks until clinic followup as patient not ambulating regularly. Pain control: Standing tylenol, breakthrough narcotics Follow - up plan: Approximately jan 4-5 for her 6 week followup and likely discontinuation of wrist splint. with myself. Contact information:  Weekdays 8-5 Ophelia Charter MD [phone], After hours and holidays please check [email or web address] for group call information for [facility]

## 2017-03-15 NOTE — Progress Notes (Signed)
Progress Note  Patient Name: [name] Date of Encounter: 03/15/2017  Primary Cardiologist: Dr Burt Knack  Subjective   Remains mildly dyspneic but improved compared to yesterday; no chest pain  Inpatient Medications    Scheduled Meds: . acetaminophen  1,000 mg Oral Q8H  . aspirin  81 mg Oral Daily  . atorvastatin  40 mg Oral q1800  . carvedilol  6.25 mg Oral BID WC  . enoxaparin (LOVENOX) injection  40 mg Subcutaneous Q24H  . feeding supplement (GLUCERNA SHAKE)  237 mL Oral BID BM  . furosemide  40 mg Intravenous BID  . insulin aspart  0-15 Units Subcutaneous TID WC  . insulin aspart  0-5 Units Subcutaneous QHS  . insulin aspart  5 Units Subcutaneous TID WC  . insulin glargine  20 Units Subcutaneous QHS  . losartan  25 mg Oral Daily  . mouth rinse  15 mL Mouth Rinse BID  . pantoprazole  40 mg Oral Daily  . polyethylene glycol  17 g Oral Daily  . senna-docusate  1 tablet Oral BID   Continuous Infusions:  PRN Meds: bisacodyl, Gerhardt's butt cream, ipratropium, levalbuterol, ondansetron, oxyCODONE   Vital Signs    Vitals:   03/14/17 1353 03/14/17 2043 03/15/17 0204 03/15/17 0558  BP: (!) 108/47 (!) 100/45 (!) 97/43 (!) 99/49  Pulse: 73 80 68 66  Resp: 17 18 18 18   Temp: 97.8 F (36.6 C) 98 F (36.7 C) 98.1 F (36.7 C) 98.4 F (36.9 C)  TempSrc: Oral Oral Oral Oral  SpO2: 97% 98% 98% 100%  Weight:      Height:        Intake/Output Summary (Last 24 hours) at 03/15/2017 0954 Last data filed at 03/15/2017 0616 Gross per 24 hour  Intake 240 ml  Output 2000 ml  Net -1760 ml   Filed Weights   03/13/17 1128  Weight: 159 lb 12.8 oz (72.5 kg)    Telemetry    Sinus with occasional PVC - Personally Reviewed    Physical Exam   GEN: WD, WN, NAD Neck: No JVD Cardiac: RRR Respiratory: Mild basilar crackles GI: Soft, mildly distended, no masses MS: No edema Neuro: No focal findings   Labs    Chemistry Recent Labs  Lab 03/13/17 0332  03/14/17 0401 03/15/17 0625  NA 131* 131* 133*  K 4.4 4.4 4.3  CL 95* 94* 95*  CO2 29 28 28   GLUCOSE 128* 87 99  BUN 20 21* 18  CREATININE 0.66 0.66 0.64  CALCIUM 8.0* 8.1* 8.3*  GFRNONAA >60 >60 >60  GFRAA >60 >60 >60  ANIONGAP 7 9 10      Hematology Recent Labs  Lab 03/13/17 0332 03/14/17 0401 03/15/17 0625  WBC 14.0* 11.7* 11.4*  RBC 3.79* 3.83* 4.07  HGB 10.1* 10.1* 10.8*  HCT 32.1* 32.5* 34.7*  MCV 84.7 84.9 85.3  MCH 26.6 26.4 26.5  MCHC 31.5 31.1 31.1  RDW 17.6* 17.8* 17.6*  PLT 349 336 381      Patient Profile     83 year old female admitted with a right hip fracture. Also found to have non-ST elevation myocardial infarction.  Echocardiogram November 2019 showed ejection fraction 20-25%, need to, anteroseptal, inferior and inferoseptal hypokinesis.  There was moderate aortic stenosis and moderate aortic insufficiency.  Moderate mitral regurgitation.  Patient also with progressive anemia and heme positive stools.   Assessment & Plan    1 status post myocardial infarction-Plan to continue medical therapy with ASA, statin and coreg.  Would like for her to improve from her hip and wrist fracture prior to further cardiac evaluation.  Given recent heme positive stool and anemia she has not had cardiac catheterization. Can consider in the future as she improves.  2 ischemic cardiomyopathy-continue beta-blocker and ARB.  BP borderline so cannot advance meds.  3 acute systolic congestive heart failure-Chest xray with continued CHF; continue present dose of lasix and follow renal function; [id number].  4 paroxysmal atrial fibrillation-patient apparently had runs of atrial fibrillation previously. None in past 24 hrs. CHADSvasc 5.  No anticoagulation given anemia and heme + stool. Continue beta-blocker.  5 status post repair of hip fracture-Per primary care.  For questions or updates, please contact [place] Please consult [email or web address] for contact info under  Cardiology/STEMI.      Signed, Kirk Ruths, MD  03/15/2017, 9:54 AM

## 2017-03-15 NOTE — Progress Notes (Signed)
PROGRESS NOTE    [name]  RKY:[id number] DOB: 1930/02/29 DOA: 03/01/2017 PCP: Jinny Sanders, MD    Brief Narrative:  83 year old female who presented after a mechanical fall. She does have significant past medical history of type 2 diabetes mellitus, hypertension, dyslipidemia, anxiety, hypothyroidism and GERD. Patient sustained a mechanical fall while ambulating, no head trauma or loss of consciousness. Post trauma patient was not able to ambulate. On her initial physical examination blood pressure 135/85, heart rate 105, temperature 98.9, respiratory 22, oxygen saturation 94%. Moist mucous membranes, lungs were clear to auscultation bilaterally, no wheezing, rales or rhonchi, heart S1-S2 present and rhythmic, no gallops, abdomen was soft nontender, no lower extremity, right leg was externally rotated. Imaging showed acute comminuted intratrochanteric fracture of the proximal right femur. Comminuted fracture of the distal right radius.   Patient underwent intertrochanteric fixation right hip open reduction fixation right wrist, November 21. Her postoperative was complicated by non-ST elevation myocardial infarction. Which was treated medically.   Assessment & Plan:   Principal Problem:   Closed right hip fracture (HCC) Active Problems:   Secondary DM with neurological manifestations ([place])   Diabetic peripheral neuropathy associated with type 2 diabetes mellitus (HCC)   Hyperlipidemia   GERD   Chronic constipation   Systolic murmur   Falls frequently   Pressure injury of skin   Occult blood positive stool   Acute systolic congestive heart failure ([place])  1. NSTEMI with acute diastolic heart failure and cardiogenic pulmonary edema. Continue medical therapy, no active chest pain, continue aspirin, atorvastatin, carvedilol and losartan. Patient with dyspnea this am, chest film personally reviewed 12/04 with positive vascular congestion, interstitial infiltrates and small bilateral  pleural effusions, will give extra 40 mg IV furosemide and continue 40 mg IV bid to target negative fluid balance. Urine output 2,000 ml over last 24 hours.   2. Right hip and wrist fracture. Will continue pain control and physical therapy, dvt prophylaxis, patient will need snf at discharge. On Oxycodone.   3. Post operative anemia. Hb and Hct, stable, no need for prbc transfusion. Patient had 2 untis prbc transfused on 11/25.   4. AKI on stage 2 CKD. Renal function stable with serum cr at 0,64, K at 4,3 and serum bicarbonate at 28. Avoid nephrotoxic medications, tolerating well high doses of loop diuretic.   5. T2DM. Will continue glucose cover and monitoring, patient tolerating po well. Will continue insulin sliding scale. Capillary glucose 136, 166, 99, 109, 152. On 20 units of basal insulin, glargine.   6. Sacral and right heal stage one ulcer. Will continue local wound care.     DVT prophylaxis: enoxaparin  Code Status: full Family Communication: No family at the bedside Disposition Plan: snf   Consultants:   Orthopedics  Cardiology   Procedures:     Antimicrobials:       Subjective: This am with worsening dyspnea, refractive to supplemental 02, associated with desaturation with movement, no nausea or vomiting, no chest pain. No nausea or vomiting.   Objective: Vitals:   03/14/17 1353 03/14/17 2043 03/15/17 0204 03/15/17 0558  BP: (!) 108/47 (!) 100/45 (!) 97/43 (!) 99/49  Pulse: 73 80 68 66  Resp: 17 18 18 18   Temp: 97.8 F (36.6 C) 98 F (36.7 C) 98.1 F (36.7 C) 98.4 F (36.9 C)  TempSrc: Oral Oral Oral Oral  SpO2: 97% 98% 98% 100%  Weight:      Height:        Intake/Output  Summary (Last 24 hours) at 03/15/2017 1125 Last data filed at 03/15/2017 7902 Gross per 24 hour  Intake -  Output 2000 ml  Net -2000 ml   Filed Weights   03/13/17 1128  Weight: 72.5 kg (159 lb 12.8 oz)    Examination:   General: Not in pain or dyspnea,  deconditioned Neurology: Awake and alert, non focal  E ENT: mild pallor, no icterus, oral mucosa moist Cardiovascular: No JVD. S1-S2 present, rhythmic, no gallops, rubs, or murmurs. No lower extremity edema. Boots in place.  Pulmonary:: decreased breath sounds bilaterally at bases, adequate air movement, no wheezing, rhonchi or rales. Gastrointestinal. Abdomen flat, no organomegaly, non tender, no rebound or guarding Skin. No rashes Musculoskeletal: no joint deformities/     Data Reviewed: I have personally reviewed following labs and imaging studies  CBC: Recent Labs  Lab 03/11/17 0238 03/12/17 0422 03/13/17 0332 03/14/17 0401 03/15/17 0625  WBC 16.2* 15.1* 14.0* 11.7* 11.4*  NEUTROABS 11.5* 10.7* 9.6* 7.0 6.9  HGB 10.3* 10.8* 10.1* 10.1* 10.8*  HCT 32.1* 34.1* 32.1* 32.5* 34.7*  MCV 83.2 84.8 84.7 84.9 85.3  PLT 308 360 349 336 409   Basic Metabolic Panel: Recent Labs  Lab 03/11/17 0238 03/12/17 0422 03/13/17 0332 03/14/17 0401 03/15/17 0625  NA 129* 133* 131* 131* 133*  K 4.3 4.4 4.4 4.4 4.3  CL 93* 97* 95* 94* 95*  CO2 26 28 29 28 28   GLUCOSE 157* 123* 128* 87 99  BUN 23* 18 20 21* 18  CREATININE 0.74 0.67 0.66 0.66 0.64  CALCIUM 7.7* 8.3* 8.0* 8.1* 8.3*   GFR: Estimated Creatinine Clearance: 49.7 mL/min (by C-G formula based on SCr of 0.64 mg/dL). Liver Function Tests: No results for input(s): AST, ALT, ALKPHOS, BILITOT, PROT, ALBUMIN in the last 168 hours. No results for input(s): LIPASE, AMYLASE in the last 168 hours. No results for input(s): AMMONIA in the last 168 hours. Coagulation Profile: No results for input(s): INR, PROTIME in the last 168 hours. Cardiac Enzymes: No results for input(s): CKTOTAL, CKMB, CKMBINDEX, TROPONINI in the last 168 hours. BNP (last 3 results) No results for input(s): PROBNP in the last 8760 hours. HbA1C: No results for input(s): HGBA1C in the last 72 hours. CBG: Recent Labs  Lab 03/14/17 0804 03/14/17 1205  03/14/17 1609 03/14/17 2042 03/15/17 0728  GLUCAP 93 136* 166* 99 109*   Lipid Profile: No results for input(s): CHOL, HDL, LDLCALC, TRIG, CHOLHDL, LDLDIRECT in the last 72 hours. Thyroid Function Tests: No results for input(s): TSH, T4TOTAL, FREET4, T3FREE, THYROIDAB in the last 72 hours. Anemia Panel: No results for input(s): VITAMINB12, FOLATE, FERRITIN, TIBC, IRON, RETICCTPCT in the last 72 hours.    Radiology Studies: I have reviewed all of the imaging during this hospital visit personally     Scheduled Meds: . acetaminophen  1,000 mg Oral Q8H  . aspirin  81 mg Oral Daily  . atorvastatin  40 mg Oral q1800  . carvedilol  6.25 mg Oral BID WC  . enoxaparin (LOVENOX) injection  40 mg Subcutaneous Q24H  . feeding supplement (GLUCERNA SHAKE)  237 mL Oral BID BM  . furosemide  40 mg Intravenous BID  . insulin aspart  0-15 Units Subcutaneous TID WC  . insulin aspart  0-5 Units Subcutaneous QHS  . insulin aspart  5 Units Subcutaneous TID WC  . insulin glargine  20 Units Subcutaneous QHS  . losartan  25 mg Oral Daily  . mouth rinse  15 mL Mouth Rinse BID  .  pantoprazole  40 mg Oral Daily  . polyethylene glycol  17 g Oral Daily  . senna-docusate  1 tablet Oral BID   Continuous Infusions:   LOS: 14 days        Tammera Engert Gerome Apley, MD [facility] Pager [phone]

## 2017-03-15 NOTE — Progress Notes (Signed)
Pt BP 97/43, HR 68. Pt is sleeping and experiencing no symptoms. Notified MD on call. Will continue to monitor.

## 2017-03-15 NOTE — Plan of Care (Signed)
  Progressing Nutrition: Adequate nutrition will be maintained 03/15/2017 1115 - Progressing by Marice Potter, RN   Not Progressing Activity: Risk for activity intolerance will decrease 03/15/2017 1115 - Not Progressing by Marice Potter, RN

## 2017-03-16 DIAGNOSIS — E785 Hyperlipidemia, unspecified: Secondary | ICD-10-CM

## 2017-03-16 LAB — CBC WITH DIFFERENTIAL/PLATELET
BASOS ABS: 0 10*3/uL (ref 0.0–0.1)
BASOS PCT: 0 %
EOS ABS: 0.3 10*3/uL (ref 0.0–0.7)
Eosinophils Relative: 3 %
HEMATOCRIT: 33.4 % — AB (ref 36.0–46.0)
Hemoglobin: 10.4 g/dL — ABNORMAL LOW (ref 12.0–15.0)
LYMPHS ABS: 1.9 10*3/uL (ref 0.7–4.0)
Lymphocytes Relative: 18 %
MCH: 26.5 pg (ref 26.0–34.0)
MCHC: 31.1 g/dL (ref 30.0–36.0)
MCV: 85 fL (ref 78.0–100.0)
MONO ABS: 1.4 10*3/uL — AB (ref 0.1–1.0)
MONOS PCT: 13 %
NEUTROS ABS: 7 10*3/uL (ref 1.7–7.7)
NEUTROS PCT: 66 %
Platelets: 389 10*3/uL (ref 150–400)
RBC: 3.93 MIL/uL (ref 3.87–5.11)
RDW: 17.5 % — AB (ref 11.5–15.5)
WBC: 10.6 10*3/uL — ABNORMAL HIGH (ref 4.0–10.5)

## 2017-03-16 LAB — GLUCOSE, CAPILLARY
GLUCOSE-CAPILLARY: 129 mg/dL — AB (ref 65–99)
GLUCOSE-CAPILLARY: 125 mg/dL — AB (ref 65–99)
GLUCOSE-CAPILLARY: 72 mg/dL (ref 65–99)
Glucose-Capillary: 131 mg/dL — ABNORMAL HIGH (ref 65–99)
Glucose-Capillary: 59 mg/dL — ABNORMAL LOW (ref 65–99)

## 2017-03-16 LAB — BASIC METABOLIC PANEL
ANION GAP: 8 (ref 5–15)
BUN: 18 mg/dL (ref 6–20)
CALCIUM: 8.3 mg/dL — AB (ref 8.9–10.3)
CO2: 30 mmol/L (ref 22–32)
CREATININE: 0.62 mg/dL (ref 0.44–1.00)
Chloride: 97 mmol/L — ABNORMAL LOW (ref 101–111)
Glucose, Bld: 103 mg/dL — ABNORMAL HIGH (ref 65–99)
Potassium: 4.2 mmol/L (ref 3.5–5.1)
SODIUM: 135 mmol/L (ref 135–145)

## 2017-03-16 MED ORDER — INSULIN ASPART 100 UNIT/ML ~~LOC~~ SOLN
5.0000 [IU] | Freq: Three times a day (TID) | SUBCUTANEOUS | Status: DC
  Administered 2017-03-16 – 2017-03-17 (×5): 5 [IU] via SUBCUTANEOUS

## 2017-03-16 MED ORDER — INSULIN GLARGINE 100 UNIT/ML ~~LOC~~ SOLN
15.0000 [IU] | Freq: Every day | SUBCUTANEOUS | Status: DC
  Administered 2017-03-16: 15 [IU] via SUBCUTANEOUS
  Filled 2017-03-16 (×2): qty 0.15

## 2017-03-16 NOTE — Progress Notes (Signed)
Physical Therapy Treatment Patient Details Name: [name] MRN: [id number] DOB: November 28, 1929 Today's Date: 03/16/2017    History of Present Illness Diala Waxman Gilbertis a 83 y.o.femalewith medical history significantfor diabetes, hypertension, hyperlipidemia, anxiety, GERD, hypothyroidism, presenting after sustaining a mechanical fall onto her right hip. S/p IM intertrochanteric fixation of right hip, ORIF of right wrist 11/21. Postoperatively sustained NSTEMI and now with acute systolic CHF.     PT Comments    Session started with current program of bed level exercises, but patient voice emergent need to mobilize to [facility] for BM. Before able to mobilize, some bowel incontinence in bed, nursing assisted with cleanup. PT returned to assist pt SPT to [facility], and then later [facility] to recliner. Pt requires +2 and is unable to mobilize RLE on floor d/t combined weakness, pain, and friction. Pt tolerates standing with assistance for only 15 second prior to full collapse. In spite of pain and falls anxiety, pt remains motivated to attempt mobility. Bed level exercises are performed with decreased pain compared to prior sessions. Pt making minimal progress toward goals, but remains profoundly weak and limited.   Follow Up Recommendations  SNF     Equipment Recommendations  Wheelchair (measurements PT)    Recommendations for Other Services       Precautions / Restrictions Precautions Precautions: Fall Restrictions RUE Weight Bearing: Weight bear through elbow only RLE Weight Bearing: Weight bearing as tolerated Other Position/Activity Restrictions: no ROM at R wrist    Mobility  Bed Mobility Overal bed mobility: Needs Assistance Bed Mobility: Supine to Sit Rolling: Max assist   Supine to sit: Total assist;+2 for physical assistance     General bed mobility comments: PT pivots patient with draw sheet adn flexes trunk, pt's daughter assists with opperative leg  Transfers Overall transfer  level: Needs assistance Equipment used: 1 person hand held assist Transfers: Sit to/from [employer] Transfers Sit to Stand: Max assist;+2 physical assistance;+2 safety/equipment Stand pivot transfers: Max assist;+2 physical assistance;+2 safety/equipment       General transfer comment: unable to tolerate WB in RLE   Ambulation/Gait                 Stairs            Wheelchair Mobility    Modified Rankin (Stroke Patients Only)       Balance                                            Cognition Arousal/Alertness: Awake/alert Behavior During Therapy: WFL for tasks assessed/performed Overall Cognitive Status: Within Functional Limits for tasks assessed                                        Exercises General Exercises - Lower Extremity Ankle Circles/Pumps: AROM;Both;20 reps Heel Slides: AAROM;Right;15 reps Hip ABduction/ADduction: AAROM;Right;15 reps    General Comments        Pertinent Vitals/Pain Pain Assessment: 0-10 Pain Score: 2  Pain Location: Rigtjh hip operative site Pain Descriptors / Indicators: Aching Pain Intervention(s): Limited activity within patient's tolerance;Monitored during session;Repositioned    Home Living                      Prior Function  PT Goals (current goals can now be found in the care plan section) Acute Rehab PT Goals Patient Stated Goal: to decrease pain PT Goal Formulation: With patient Time For Goal Achievement: 03/30/17 Potential to Achieve Goals: Fair Progress towards PT goals: Progressing toward goals    Frequency    Min 3X/week      PT Plan Current plan remains appropriate    Co-evaluation              AM-PAC PT "6 Clicks" Daily Activity  Outcome Measure  Difficulty turning over in bed (including adjusting bedclothes, sheets and blankets)?: Unable Difficulty moving from lying on back to sitting on the side of the bed? :  Unable Difficulty sitting down on and standing up from a chair with arms (e.g., wheelchair, bedside commode, etc,.)?: Unable Help needed moving to and from a bed to chair (including a wheelchair)?: Total Help needed walking in hospital room?: Total Help needed climbing 3-5 steps with a railing? : Total 6 Click Score: 6    End of Session Equipment Utilized During Treatment: Gait belt;Oxygen Activity Tolerance: Patient limited by pain Patient left: with call bell/phone within reach;with bed alarm set;in chair Nurse Communication: Mobility status PT Visit Diagnosis: Unsteadiness on feet (R26.81);Other abnormalities of gait and mobility (R26.89);History of falling (Z91.81);Muscle weakness (generalized) (M62.81);Pain Pain - Right/Left: Right Pain - part of body: Leg     Time: 1109(out of room from 1131-1141)-1205 PT Time Calculation (min) (ACUTE ONLY): 56 min  Charges:  $Therapeutic Exercise: 8-22 mins $Therapeutic Activity: 8-22 mins                    G Codes:  Functional Assessment Tool Used: AM-PAC 6 Clicks Basic Mobility    12:29 PM, 03/16/17 Etta Grandchild, PT, DPT [facility] - [place] [phone] (Pager)  [phone] (Mobile)  [phone] (Office)      Ka Bench C 03/16/2017, 12:25 PM

## 2017-03-16 NOTE — Progress Notes (Signed)
PROGRESS NOTE    [name]  OFB:[id number] DOB: 01-10-1930 DOA: 03/01/2017 PCP: Jinny Sanders, MD    Brief Narrative:  83 year old female who presented after a mechanical fall. She does have significant past medical history of type 2 diabetes mellitus, hypertension, dyslipidemia, anxiety, hypothyroidism and GERD. Patient sustained a mechanical fall while ambulating, no head trauma or loss of consciousness. Post trauma patient was not able to ambulate. On her initial physical examination blood pressure 135/85, heart rate 105, temperature 98.9, respiratory 22, oxygen saturation 94%. Moist mucous membranes, lungs were clear to auscultation bilaterally, no wheezing, rales or rhonchi, heart S1-S2 present and rhythmic, no gallops, abdomen was soft nontender, no lower extremity, right leg was externally rotated. Imaging showed acute comminuted intratrochanteric fracture of the proximal right femur. Comminuted fracture of the distal right radius.   Patient underwent intertrochanteric fixation right hip open reduction fixation right wrist, November 21. Her postoperative was complicated by non-ST elevation myocardial infarction. Which was treated medically.    Assessment & Plan:   Principal Problem:   Closed right hip fracture (HCC) Active Problems:   Secondary DM with neurological manifestations ([place])   Diabetic peripheral neuropathy associated with type 2 diabetes mellitus (HCC)   Hyperlipidemia   GERD   Chronic constipation   Systolic murmur   Falls frequently   Pressure injury of skin   Occult blood positive stool   Acute systolic congestive heart failure ([place])   1. NSTEMI with acute systolic heart failure and cardiogenic pulmonary edema. Echocardiogram with systolic LV function down to 20 to 25%, with severe hypokinesis, of the mid-apical-antero-septal, inferior, and inferior-septal myocardium. Responding well to diuresis, will continue furosemide 40 mg IV q12. Will continue asa,  atorvastatin, carvedilol, and losartan. Urine output 1,800 over last 24 hours.   2. Right hip and wrist fracture. Patient will be discharged to snf for rehab, when patient more euvolemic.  3. Post operative anemia sp 2 untis prbc transfused on 11/25. This am Hb and Hct, continue to be stable.  4. AKI on stage 2 CKD. Renal function continue to be stable, with serum cr at 0.62, will continue close monitoring while on IV loop diuretics. Serum K at 4.2.  5. T2DM.  Glucose cover and monitoring with insulin sliding scale. Capillary glucose 152, 89, 88, 129, 125.  Will decrease to 15 units of basal insulin, glargine to prevent hypoglycemia.   6. Sacral and right heal stage one ulcer.  Wound care and continue physical therapy.     DVT prophylaxis: enoxaparin  Code Status: full Family Communication: No family at the bedside Disposition Plan: snf   Consultants:   Orthopedics  Cardiology   Procedures:     Antimicrobials:   Subjective: Patient reports improvement in dyspnea, no chest pain, no nausea or vomiting.   Objective: Vitals:   03/16/17 0201 03/16/17 0432 03/16/17 0830 03/16/17 0925  BP: (!) 107/54 (!) 105/54 133/66   Pulse: 66 68    Resp: 18 18 17    Temp: (!) 97.5 F (36.4 C) 97.8 F (36.6 C)  97.6 F (36.4 C)  TempSrc: Oral Oral  Oral  SpO2: 99% 97% 99%   Weight:      Height:        Intake/Output Summary (Last 24 hours) at 03/16/2017 1230 Last data filed at 03/16/2017 0800 Gross per 24 hour  Intake -  Output 1500 ml  Net -1500 ml   Filed Weights   03/13/17 1128 03/15/17 1532  Weight: 72.5 kg (159  lb 12.8 oz) 69.6 kg (153 lb 8 oz)    Examination:   General: deconditioned Neurology: Awake and alert, non focal  E ENT: mild pallor, no icterus, oral mucosa moist Cardiovascular: No JVD. S1-S2 present, rhythmic, no gallops, rubs. Positive murmurs 3/6 at the right sternal border. No lower extremity edema. Pulmonary: decreased breath sounds  bilaterally at bases, adequate air movement, no wheezing, scattered rhonchi and rales. Gastrointestinal. Abdomen flat, no organomegaly, non tender, no rebound or guarding Skin. No rashes Musculoskeletal: no joint deformities     Data Reviewed: I have personally reviewed following labs and imaging studies  CBC: Recent Labs  Lab 03/12/17 0422 03/13/17 0332 03/14/17 0401 03/15/17 0625 03/16/17 0446  WBC 15.1* 14.0* 11.7* 11.4* 10.6*  NEUTROABS 10.7* 9.6* 7.0 6.9 7.0  HGB 10.8* 10.1* 10.1* 10.8* 10.4*  HCT 34.1* 32.1* 32.5* 34.7* 33.4*  MCV 84.8 84.7 84.9 85.3 85.0  PLT 360 349 336 381 035   Basic Metabolic Panel: Recent Labs  Lab 03/12/17 0422 03/13/17 0332 03/14/17 0401 03/15/17 0625 03/16/17 0446  NA 133* 131* 131* 133* 135  K 4.4 4.4 4.4 4.3 4.2  CL 97* 95* 94* 95* 97*  CO2 28 29 28 28 30   GLUCOSE 123* 128* 87 99 103*  BUN 18 20 21* 18 18  CREATININE 0.67 0.66 0.66 0.64 0.62  CALCIUM 8.3* 8.0* 8.1* 8.3* 8.3*   GFR: Estimated Creatinine Clearance: 48.7 mL/min (by C-G formula based on SCr of 0.62 mg/dL). Liver Function Tests: No results for input(s): AST, ALT, ALKPHOS, BILITOT, PROT, ALBUMIN in the last 168 hours. No results for input(s): LIPASE, AMYLASE in the last 168 hours. No results for input(s): AMMONIA in the last 168 hours. Coagulation Profile: No results for input(s): INR, PROTIME in the last 168 hours. Cardiac Enzymes: No results for input(s): CKTOTAL, CKMB, CKMBINDEX, TROPONINI in the last 168 hours. BNP (last 3 results) No results for input(s): PROBNP in the last 8760 hours. HbA1C: No results for input(s): HGBA1C in the last 72 hours. CBG: Recent Labs  Lab 03/15/17 0728 03/15/17 1219 03/15/17 1700 03/15/17 2215 03/16/17 0750  GLUCAP 109* 152* 89 88 129*   Lipid Profile: No results for input(s): CHOL, HDL, LDLCALC, TRIG, CHOLHDL, LDLDIRECT in the last 72 hours. Thyroid Function Tests: No results for input(s): TSH, T4TOTAL, FREET4, T3FREE,  THYROIDAB in the last 72 hours. Anemia Panel: No results for input(s): VITAMINB12, FOLATE, FERRITIN, TIBC, IRON, RETICCTPCT in the last 72 hours.    Radiology Studies: I have reviewed all of the imaging during this hospital visit personally     Scheduled Meds: . acetaminophen  1,000 mg Oral Q8H  . aspirin  81 mg Oral Daily  . atorvastatin  40 mg Oral q1800  . carvedilol  6.25 mg Oral BID WC  . enoxaparin (LOVENOX) injection  40 mg Subcutaneous Q24H  . feeding supplement (GLUCERNA SHAKE)  237 mL Oral BID BM  . furosemide  40 mg Intravenous BID  . insulin aspart  0-15 Units Subcutaneous TID WC  . insulin aspart  0-5 Units Subcutaneous QHS  . insulin aspart  5 Units Subcutaneous TID WC  . insulin glargine  20 Units Subcutaneous QHS  . losartan  25 mg Oral Daily  . mouth rinse  15 mL Mouth Rinse BID  . pantoprazole  40 mg Oral Daily  . polyethylene glycol  17 g Oral Daily  . senna-docusate  1 tablet Oral BID   Continuous Infusions:   LOS: 15 days  Alvar Malinoski Gerome Apley, MD [facility] Pager [phone]

## 2017-03-16 NOTE — Plan of Care (Signed)
  Progressing Activity: Risk for activity intolerance will decrease 03/16/2017 1333 - Progressing by Marice Potter, RN Nutrition: Adequate nutrition will be maintained 03/16/2017 1333 - Progressing by Marice Potter, RN

## 2017-03-16 NOTE — Consult Note (Signed)
[place] Nurse wound follow up WTA in yesterday for weekly follow up, noted new area on the left heel. Prevalon boots had been ordered but were not in place. Today I am following up on Stage 1 pressure injury left heel  Wound type: Stage 1 Pressure injury left heel  Measurement: 3cm x 3cm x 0cm  Wound bed: unblanchable redness, intact skin Drainage (amount, consistency, odor) none Periwound: intact  Dressing procedure/placement/frequency: Continue use of Prevalon boot, however today when I arrive patient tells me "those boots hurt my feet" "they are too tight", she has wiggled her foot out of the left boot.  I have explained rationale for offloading heel and importance.  If she can not tolerate boot, heel must be floated.  She will remind staff if she pushes boot off to tell staff to put up on pillows.   [place] Nurse team will follow along with you for weekly wound assessments.  Please notify me of any acute changes in the wounds or any new areas of concerns [place] MSN, RN,CWOCN, [place], [place]

## 2017-03-16 NOTE — Progress Notes (Signed)
CSW submitted to insurance for authorization. Awaiting approval for SNF.   [name] [name] LCSWA [phone]

## 2017-03-16 NOTE — Progress Notes (Addendum)
4pm-CSW updated patient's daughter in room. CSW explained that [employer] has only approved patient for 7 days initially and that patient would have to try to work with therapy for continued authorization.   1:50pm-Insurance approval received for patient to discharge to [place] tomorrow.   [name] LCSWA [phone]

## 2017-03-16 NOTE — Progress Notes (Signed)
Progress Note  Patient Name: [name] Date of Encounter: 03/16/2017  Primary Cardiologist: Dr Burt Knack  Subjective   Dyspnea continues to improve; no chest pain  Inpatient Medications    Scheduled Meds: . acetaminophen  1,000 mg Oral Q8H  . aspirin  81 mg Oral Daily  . atorvastatin  40 mg Oral q1800  . carvedilol  6.25 mg Oral BID WC  . enoxaparin (LOVENOX) injection  40 mg Subcutaneous Q24H  . feeding supplement (GLUCERNA SHAKE)  237 mL Oral BID BM  . furosemide  40 mg Intravenous BID  . insulin aspart  0-15 Units Subcutaneous TID WC  . insulin aspart  0-5 Units Subcutaneous QHS  . insulin aspart  5 Units Subcutaneous TID WC  . insulin glargine  20 Units Subcutaneous QHS  . losartan  25 mg Oral Daily  . mouth rinse  15 mL Mouth Rinse BID  . pantoprazole  40 mg Oral Daily  . polyethylene glycol  17 g Oral Daily  . senna-docusate  1 tablet Oral BID   Continuous Infusions:  PRN Meds: bisacodyl, Gerhardt's butt cream, ipratropium, levalbuterol, ondansetron, oxyCODONE   Vital Signs    Vitals:   03/16/17 0201 03/16/17 0432 03/16/17 0830 03/16/17 0925  BP: (!) 107/54 (!) 105/54 133/66   Pulse: 66 68    Resp: 18 18 17    Temp: (!) 97.5 F (36.4 C) 97.8 F (36.6 C)  97.6 F (36.4 C)  TempSrc: Oral Oral  Oral  SpO2: 99% 97% 99%   Weight:      Height:        Intake/Output Summary (Last 24 hours) at 03/16/2017 0944 Last data filed at 03/16/2017 0800 Gross per 24 hour  Intake -  Output 2500 ml  Net -2500 ml   Filed Weights   03/13/17 1128 03/15/17 1532  Weight: 159 lb 12.8 oz (72.5 kg) 153 lb 8 oz (69.6 kg)    Telemetry    Sinus with occasional PVC and couplet - Personally Reviewed    Physical Exam   GEN: WD, mildly obese, NAD Neck: supple Cardiac: RRR, 2/6 systolic murmur LSB Respiratory: CTA anteriorly GI: Soft, NT/ND MS: No edema, right hand in splint Neuro: grossly intact   Labs    Chemistry Recent Labs  Lab 03/14/17 0401  03/15/17 0625 03/16/17 0446  NA 131* 133* 135  K 4.4 4.3 4.2  CL 94* 95* 97*  CO2 28 28 30   GLUCOSE 87 99 103*  BUN 21* 18 18  CREATININE 0.66 0.64 0.62  CALCIUM 8.1* 8.3* 8.3*  GFRNONAA >60 >60 >60  GFRAA >60 >60 >60  ANIONGAP 9 10 8      Hematology Recent Labs  Lab 03/14/17 0401 03/15/17 0625 03/16/17 0446  WBC 11.7* 11.4* 10.6*  RBC 3.83* 4.07 3.93  HGB 10.1* 10.8* 10.4*  HCT 32.5* 34.7* 33.4*  MCV 84.9 85.3 85.0  MCH 26.4 26.5 26.5  MCHC 31.1 31.1 31.1  RDW 17.8* 17.6* 17.5*  PLT 336 381 389      Patient Profile     83 year old female admitted with a right hip fracture. Also found to have non-ST elevation myocardial infarction.  Echocardiogram November 2019 showed ejection fraction 20-25%, need to, anteroseptal, inferior and inferoseptal hypokinesis.  There was moderate aortic stenosis and moderate aortic insufficiency.  Moderate mitral regurgitation.  Patient also with progressive anemia and heme positive stools.   Assessment & Plan    1 status post myocardial infarction-Continue ASA, statin and coreg.  As  she improves from her hip and wrist fracture will consider further cardiac evaluation.  Cardiac cath was not performed initially due to anemia, heme positive stool and overall medical condition.   2 ischemic cardiomyopathy-continue beta-blocker and ARB.  Cannot advance at this point due to borderline BP.  3 acute systolic congestive heart failure-improving; continue present dose of diuretics and follow renal function.   4 paroxysmal atrial fibrillation-patient apparently had runs of atrial fibrillation previously on telemetry. None in past 72 hrs. CHADSvasc 5.  No anticoagulation given anemia and heme + stool. Continue beta-blocker. Atrial fibrillation may have been related to stress of recent surgery and fractures.  5 status post repair of hip fracture-Per primary care.  For questions or updates, please contact [place] Please consult [email or web address]  for contact info under Cardiology/STEMI.      Signed, Kirk Ruths, MD  03/16/2017, 9:44 AM

## 2017-03-17 ENCOUNTER — Other Ambulatory Visit: Payer: Self-pay | Admitting: *Deleted

## 2017-03-17 DIAGNOSIS — S72001D Fracture of unspecified part of neck of right femur, subsequent encounter for closed fracture with routine healing: Secondary | ICD-10-CM | POA: Diagnosis not present

## 2017-03-17 DIAGNOSIS — I5022 Chronic systolic (congestive) heart failure: Secondary | ICD-10-CM | POA: Diagnosis not present

## 2017-03-17 DIAGNOSIS — R5383 Other fatigue: Secondary | ICD-10-CM | POA: Diagnosis not present

## 2017-03-17 DIAGNOSIS — S52571D Other intraarticular fracture of lower end of right radius, subsequent encounter for closed fracture with routine healing: Secondary | ICD-10-CM | POA: Diagnosis not present

## 2017-03-17 DIAGNOSIS — N179 Acute kidney failure, unspecified: Secondary | ICD-10-CM | POA: Diagnosis not present

## 2017-03-17 DIAGNOSIS — E1142 Type 2 diabetes mellitus with diabetic polyneuropathy: Secondary | ICD-10-CM | POA: Diagnosis not present

## 2017-03-17 DIAGNOSIS — S92153A Displaced avulsion fracture (chip fracture) of unspecified talus, initial encounter for closed fracture: Secondary | ICD-10-CM | POA: Diagnosis not present

## 2017-03-17 DIAGNOSIS — K219 Gastro-esophageal reflux disease without esophagitis: Secondary | ICD-10-CM | POA: Diagnosis not present

## 2017-03-17 DIAGNOSIS — L89159 Pressure ulcer of sacral region, unspecified stage: Secondary | ICD-10-CM | POA: Diagnosis not present

## 2017-03-17 DIAGNOSIS — L89621 Pressure ulcer of left heel, stage 1: Secondary | ICD-10-CM | POA: Diagnosis not present

## 2017-03-17 DIAGNOSIS — S52501A Unspecified fracture of the lower end of right radius, initial encounter for closed fracture: Secondary | ICD-10-CM | POA: Diagnosis not present

## 2017-03-17 DIAGNOSIS — L89322 Pressure ulcer of left buttock, stage 2: Secondary | ICD-10-CM | POA: Diagnosis not present

## 2017-03-17 DIAGNOSIS — E119 Type 2 diabetes mellitus without complications: Secondary | ICD-10-CM | POA: Diagnosis not present

## 2017-03-17 DIAGNOSIS — R41841 Cognitive communication deficit: Secondary | ICD-10-CM | POA: Diagnosis not present

## 2017-03-17 DIAGNOSIS — M6281 Muscle weakness (generalized): Secondary | ICD-10-CM | POA: Diagnosis not present

## 2017-03-17 DIAGNOSIS — I214 Non-ST elevation (NSTEMI) myocardial infarction: Secondary | ICD-10-CM | POA: Diagnosis not present

## 2017-03-17 DIAGNOSIS — L89629 Pressure ulcer of left heel, unspecified stage: Secondary | ICD-10-CM | POA: Diagnosis not present

## 2017-03-17 DIAGNOSIS — D6489 Other specified anemias: Secondary | ICD-10-CM | POA: Diagnosis not present

## 2017-03-17 DIAGNOSIS — R531 Weakness: Secondary | ICD-10-CM | POA: Diagnosis not present

## 2017-03-17 DIAGNOSIS — S79911A Unspecified injury of right hip, initial encounter: Secondary | ICD-10-CM | POA: Diagnosis not present

## 2017-03-17 DIAGNOSIS — S72001A Fracture of unspecified part of neck of right femur, initial encounter for closed fracture: Secondary | ICD-10-CM | POA: Diagnosis not present

## 2017-03-17 DIAGNOSIS — I5033 Acute on chronic diastolic (congestive) heart failure: Secondary | ICD-10-CM | POA: Diagnosis not present

## 2017-03-17 DIAGNOSIS — E785 Hyperlipidemia, unspecified: Secondary | ICD-10-CM | POA: Diagnosis not present

## 2017-03-17 DIAGNOSIS — I5021 Acute systolic (congestive) heart failure: Secondary | ICD-10-CM | POA: Diagnosis not present

## 2017-03-17 DIAGNOSIS — S72143A Displaced intertrochanteric fracture of unspecified femur, initial encounter for closed fracture: Secondary | ICD-10-CM | POA: Diagnosis not present

## 2017-03-17 DIAGNOSIS — L89611 Pressure ulcer of right heel, stage 1: Secondary | ICD-10-CM | POA: Diagnosis not present

## 2017-03-17 DIAGNOSIS — K5909 Other constipation: Secondary | ICD-10-CM | POA: Diagnosis not present

## 2017-03-17 DIAGNOSIS — S52501D Unspecified fracture of the lower end of right radius, subsequent encounter for closed fracture with routine healing: Secondary | ICD-10-CM | POA: Diagnosis not present

## 2017-03-17 DIAGNOSIS — R5381 Other malaise: Secondary | ICD-10-CM | POA: Diagnosis not present

## 2017-03-17 LAB — BASIC METABOLIC PANEL
Anion gap: 6 (ref 5–15)
BUN: 18 mg/dL (ref 6–20)
CO2: 30 mmol/L (ref 22–32)
CREATININE: 0.65 mg/dL (ref 0.44–1.00)
Calcium: 8.3 mg/dL — ABNORMAL LOW (ref 8.9–10.3)
Chloride: 96 mmol/L — ABNORMAL LOW (ref 101–111)
GFR calc Af Amer: >60 mL/min (ref >60)
GLUCOSE: 118 mg/dL — AB (ref 65–99)
POTASSIUM: 4.4 mmol/L (ref 3.5–5.1)
Sodium: 132 mmol/L — ABNORMAL LOW (ref 135–145)

## 2017-03-17 LAB — GLUCOSE, CAPILLARY
GLUCOSE-CAPILLARY: 109 mg/dL — AB (ref 65–99)
Glucose-Capillary: 176 mg/dL — ABNORMAL HIGH (ref 65–99)

## 2017-03-17 MED ORDER — LOSARTAN POTASSIUM 25 MG PO TABS: 25.0000 mg | ORAL_TABLET | Freq: Every day | ORAL | 0 refills | Status: DC

## 2017-03-17 MED ORDER — ATORVASTATIN CALCIUM 40 MG PO TABS: 40.0000 mg | ORAL_TABLET | Freq: Every day | ORAL | 0 refills | Status: DC

## 2017-03-17 MED ORDER — FUROSEMIDE 20 MG PO TABS: 20.0000 mg | ORAL_TABLET | Freq: Every day | ORAL | 0 refills | Status: DC

## 2017-03-17 MED ORDER — POLYETHYLENE GLYCOL 3350 17 G PO PACK: 17.0000 g | PACK | Freq: Every day | ORAL | 0 refills | Status: DC

## 2017-03-17 MED ORDER — CARVEDILOL 6.25 MG PO TABS: 6.2500 mg | ORAL_TABLET | Freq: Two times a day (BID) | ORAL | 0 refills | Status: DC

## 2017-03-17 MED ORDER — GLUCERNA SHAKE PO LIQD: 237.0000 mL | Freq: Two times a day (BID) | ORAL | 0 refills | Status: AC

## 2017-03-17 NOTE — Progress Notes (Signed)
Progress Note  Patient Name: [name] Date of Encounter: 03/17/2017  Primary Cardiologist: Dr Burt Knack  Subjective   No CP, dyspnea continues to improve  Inpatient Medications    Scheduled Meds: . acetaminophen  1,000 mg Oral Q8H  . aspirin  81 mg Oral Daily  . atorvastatin  40 mg Oral q1800  . carvedilol  6.25 mg Oral BID WC  . enoxaparin (LOVENOX) injection  40 mg Subcutaneous Q24H  . feeding supplement (GLUCERNA SHAKE)  237 mL Oral BID BM  . furosemide  40 mg Intravenous BID  . insulin aspart  0-15 Units Subcutaneous TID WC  . insulin aspart  0-5 Units Subcutaneous QHS  . insulin aspart  5 Units Subcutaneous TID WC  . insulin glargine  15 Units Subcutaneous QHS  . losartan  25 mg Oral Daily  . mouth rinse  15 mL Mouth Rinse BID  . pantoprazole  40 mg Oral Daily  . polyethylene glycol  17 g Oral Daily  . senna-docusate  1 tablet Oral BID   Continuous Infusions:  PRN Meds: bisacodyl, Gerhardt's butt cream, ipratropium, levalbuterol, ondansetron, oxyCODONE   Vital Signs    Vitals:   03/17/17 0700 03/17/17 0728 03/17/17 0839 03/17/17 0912  BP:   123/61 121/60  Pulse:   82 83  Resp:    18  Temp:    97.8 F (36.6 C)  TempSrc:    Oral  SpO2:  92%  96%  Weight: 151 lb (68.5 kg)     Height:        Intake/Output Summary (Last 24 hours) at 03/17/2017 1235 Last data filed at 03/17/2017 1129 Gross per 24 hour  Intake 460 ml  Output 2250 ml  Net -1790 ml   Filed Weights   03/13/17 1128 03/15/17 1532 03/17/17 0700  Weight: 159 lb 12.8 oz (72.5 kg) 153 lb 8 oz (69.6 kg) 151 lb (68.5 kg)    Telemetry    Sinus with rare PVC - Personally Reviewed    Physical Exam   GEN: WD, NAD Neck: supple, no JVD Cardiac: RRR Respiratory: diminished BS bases GI: Soft, NT/ND,no masses MS: No edema Neuro: No focal findings   Labs    Chemistry Recent Labs  Lab 03/15/17 0625 03/16/17 0446 03/17/17 0236  NA 133* 135 132*  K 4.3 4.2 4.4  CL 95* 97* 96*    CO2 28 30 30   GLUCOSE 99 103* 118*  BUN 18 18 18   CREATININE 0.64 0.62 0.65  CALCIUM 8.3* 8.3* 8.3*  GFRNONAA >60 >60 >60  GFRAA >60 >60 >60  ANIONGAP 10 8 6      Hematology Recent Labs  Lab 03/14/17 0401 03/15/17 0625 03/16/17 0446  WBC 11.7* 11.4* 10.6*  RBC 3.83* 4.07 3.93  HGB 10.1* 10.8* 10.4*  HCT 32.5* 34.7* 33.4*  MCV 84.9 85.3 85.0  MCH 26.4 26.5 26.5  MCHC 31.1 31.1 31.1  RDW 17.8* 17.6* 17.5*  PLT 336 381 389      Patient Profile     83 year old female admitted with a right hip fracture. Also found to have non-ST elevation myocardial infarction.  Echocardiogram November 2019 showed ejection fraction 20-25%, need to, anteroseptal, inferior and inferoseptal hypokinesis.  There was moderate aortic stenosis and moderate aortic insufficiency.  Moderate mitral regurgitation.  Patient also with progressive anemia and heme positive stools.   Assessment & Plan    1 status post myocardial infarction-Continue medical therapy with ASA, statin and coreg.  As she improves from  her hip and wrist fracture will consider further cardiac evaluation.  Cardiac cath was not performed initially due to anemia, heme positive stool and overall medical condition. Can possibly perform prior to DC.  2 ischemic cardiomyopathy-continue coreg and cozaar; will need cath once she improves.  3 acute systolic congestive heart failure-continue lasix at present dose and follow renal function.  4 paroxysmal atrial fibrillation-patient apparently had runs of atrial fibrillation previously on telemetry. None in past 5 days. CHADSvasc 5.  No anticoagulation given anemia and heme + stool. Continue beta-blocker. Atrial fibrillation may have been related to stress of recent surgery and fractures.  5 status post repair of hip fracture-Per primary care.  For questions or updates, please contact [place] Please consult [email or web address] for contact info under Cardiology/STEMI.      Signed, Kirk Ruths, MD  03/17/2017, 12:35 PM

## 2017-03-17 NOTE — Progress Notes (Signed)
Nsg Discharge Note  Admit Date:  03/01/2017 Discharge date: 03/17/2017   [name] to be D/C'd Skilled nursing facility per MD order.  AVS completed.  Copy for chart, and copy for patient signed, and dated. Patient/caregiver able to verbalize understanding.  Discharge Medication: Allergies as of 03/17/2017      Reactions   Codeine    Rapid heart rate   Metformin    unspecified   Penicillins    unspecified   Pioglitazone    REACTION: rash   Fenofibrate Palpitations      Medication List    STOP taking these medications   alendronate 70 MG tablet Commonly known as:  FOSAMAX   lisinopril 5 MG tablet Commonly known as:  PRINIVIL,ZESTRIL   pantoprazole 40 MG tablet Commonly known as:  PROTONIX     TAKE these medications   acetaminophen 500 MG tablet Commonly known as:  TYLENOL Take 2 tabs po q8 hours x 14 days following surgery   aspirin 81 MG tablet Take 81 mg by mouth daily.   atorvastatin 40 MG tablet Commonly known as:  LIPITOR Take 1 tablet (40 mg total) by mouth daily at 6 PM.   calcium carbonate 1250 (500 Ca) MG chewable tablet Commonly known as:  OS-CAL Chew 1 tablet by mouth daily.   carvedilol 6.25 MG tablet Commonly known as:  COREG Take 1 tablet (6.25 mg total) by mouth 2 (two) times daily with a meal.   enoxaparin 40 MG/0.4ML injection Commonly known as:  LOVENOX Inject 0.4 mLs (40 mg total) into the skin daily.   feeding supplement (GLUCERNA SHAKE) Liqd Take 237 mLs by mouth 2 (two) times daily between meals.   furosemide 20 MG tablet Commonly known as:  LASIX Take 1 tablet (20 mg total) by mouth daily.   insulin aspart 100 UNIT/ML FlexPen Commonly known as:  NOVOLOG FLEXPEN Inject 10 Units into the skin 3 (three) times daily with meals.   insulin NPH Human 100 UNIT/ML injection Commonly known as:  HUMULIN N,NOVOLIN N Inject 15 Units into the skin at bedtime.   losartan 25 MG tablet Commonly known as:  COZAAR Take 1 tablet (25 mg  total) by mouth daily.   omeprazole 20 MG capsule Commonly known as:  PRILOSEC Take 20 mg by mouth 2 (two) times daily before a meal.   ondansetron 4 MG tablet Commonly known as:  ZOFRAN Take 1 tablet (4 mg total) by mouth every 8 (eight) hours as needed for nausea or vomiting.   oxyCODONE 5 MG immediate release tablet Commonly known as:  ROXICODONE Take 1-2 tabs po q6-8 hours prn pain.  Do not exceed 6 tabs per day   polyethylene glycol packet Commonly known as:  MIRALAX / GLYCOLAX Take 17 g by mouth daily.       Discharge Assessment: Vitals:   03/17/17 0912 03/17/17 1345  BP: 121/60 (!) 109/58  Pulse: 83 76  Resp: 18 18  Temp: 97.8 F (36.6 C) 97.9 F (36.6 C)  SpO2: 96% 97%  Patient had DTI to R heel, Stage I on L heel and MASD and stage II on sacrum.  IV catheter discontinued intact. Site without signs and symptoms of complications - no redness or edema noted at insertion site, patient denies c/o pain - only slight tenderness at site.  Dressing with slight pressure applied.  D/c Instructions-Education: .Patient transported by [facility] to [facility]. Report called.   Jhamir Pickup Margaretha Sheffield, RN 03/17/2017 5:04 PM

## 2017-03-17 NOTE — Progress Notes (Signed)
Late entry for 0730 When getting report on patient. I noticed only 1 prevalon boot on. Patient would not allow me to put on the Right boot stating "They make my feet burn." She did allow me to float her right heel and leave the Left boot on.

## 2017-03-17 NOTE — Care Management Important Message (Signed)
Important Message  Patient Details  Name: [name] MRN: [id number] Date of Birth: Apr 21, 1929   Medicare Important Message Given:  Yes    Ciin Brazzel Abena 03/17/2017, 10:25 AM

## 2017-03-17 NOTE — Progress Notes (Signed)
Occupational Therapy Treatment Patient Details Name: [name] MRN: [id number] DOB: 1929-08-27 Today's Date: 03/17/2017    History of present illness [name] a 83 y.o.femalewith medical history significantfor diabetes, hypertension, hyperlipidemia, anxiety, GERD, hypothyroidism, presenting after sustaining a mechanical fall onto her right hip. S/p IM intertrochanteric fixation of right hip, ORIF of right wrist 11/21. Postoperatively sustained NSTEMI and now with acute systolic CHF.    OT comments  Pt continues to require max assist +2 for stand pivot transfers to [facility] and total assist for peri care. Pt needs max cues to maintain RUE NWB/WB through elbow only. D/c plan remains appropriate. Will continue to follow acutely.   Follow Up Recommendations  SNF;Supervision/Assistance - 24 hour    Equipment Recommendations  Other (comment)(TBD at next venue)    Recommendations for Other Services      Precautions / Restrictions Precautions Precautions: Fall Restrictions Weight Bearing Restrictions: Yes RUE Weight Bearing: Weight bear through elbow only RLE Weight Bearing: Weight bearing as tolerated Other Position/Activity Restrictions: no ROM at R wrist       Mobility Bed Mobility Overal bed mobility: Needs Assistance Bed Mobility: Supine to Sit Rolling: Max assist   Supine to sit: Max assist     General bed mobility comments: Assist to bring legs to EOB and elevate trunk to upright  Transfers Overall transfer level: Needs assistance Equipment used: 2 person hand held assist Transfers: Sit to/from [facility] Sit to Stand: Max assist;+2 physical assistance;+2 safety/equipment Stand pivot transfers: Max assist;+2 physical assistance;+2 safety/equipment       General transfer comment: 2 person face to face with gait belt and chuck pad    Balance Overall balance assessment: Needs assistance Sitting-balance support: Single extremity  supported;Feet supported Sitting balance-Leahy Scale: Poor Sitting balance - Comments: MinGuard-MinA for static sitting EOB; Required UE support or physical assistance. Postural control: Left lateral lean   Standing balance-Leahy Scale: Poor Standing balance comment: reliant on UE support/external support from therapist                           ADL either performed or assessed with clinical judgement   ADL Overall ADL's : Needs assistance/impaired                     Lower Body Dressing: Total assistance Lower Body Dressing Details (indicate cue type and reason): to don socks at bed level Toilet Transfer: Maximal assistance;+2 for physical assistance;Stand-pivot;BSC   Toileting- Clothing Manipulation and Hygiene: Total assistance;+2 for physical assistance;Sit to/from stand Toileting - Clothing Manipulation Details (indicate cue type and reason): for peri care after BM     Functional mobility during ADLs: Maximal assistance;+2 for physical assistance(for stand pivot only) General ADL Comments: Max cues throughout session to maintain RUE NWB     Vision       Perception     Praxis      Cognition Arousal/Alertness: Awake/alert Behavior During Therapy: Anxious Overall Cognitive Status: No family/caregiver present to determine baseline cognitive functioning                                          Exercises    Shoulder Instructions       General Comments      Pertinent Vitals/ Pain       Pain Assessment: Faces Faces Pain  Scale: Hurts whole lot Pain Location: Right hip operative site Pain Descriptors / Indicators: Aching Pain Intervention(s): Monitored during session;Repositioned  Home Living                                          Prior Functioning/Environment              Frequency  Min 2X/week        Progress Toward Goals  OT Goals(current goals can now be found in the care plan section)   Progress towards OT goals: Progressing toward goals  Acute Rehab OT Goals Patient Stated Goal: to decrease pain OT Goal Formulation: With patient  Plan Discharge plan remains appropriate    Co-evaluation      Reason for Co-Treatment: Complexity of the patient's impairments (multi-system involvement);For patient/therapist safety PT goals addressed during session: Mobility/safety with mobility OT goals addressed during session: ADL's and self-care      AM-PAC PT "6 Clicks" Daily Activity     Outcome Measure   Help from another person eating meals?: A Little Help from another person taking care of personal grooming?: A Little Help from another person toileting, which includes using toliet, bedpan, or urinal?: Total Help from another person bathing (including washing, rinsing, drying)?: A Lot Help from another person to put on and taking off regular upper body clothing?: A Lot Help from another person to put on and taking off regular lower body clothing?: Total 6 Click Score: 12    End of Session Equipment Utilized During Treatment: Oxygen  OT Visit Diagnosis: Unsteadiness on feet (R26.81);Other abnormalities of gait and mobility (R26.89);Repeated falls (R29.6);History of falling (Z91.81);Muscle weakness (generalized) (M62.81);Pain Pain - Right/Left: Right Pain - part of body: Arm;Hand;Leg   Activity Tolerance Patient tolerated treatment well   Patient Left in chair;with call bell/phone within reach;with chair alarm set   Nurse Communication Mobility status;Other (comment)(pt had BM during session, up in chair)        Time: 6568-1275 OT Time Calculation (min): 24 min  Charges: OT General Charges $OT Visit: 1 Visit OT Treatments $Self Care/Home Management : 8-22 mins  Merian Wroe A. Ulice Brilliant, M.S., OTR/L Pager: [place] 03/17/2017, 3:20 PM

## 2017-03-17 NOTE — Progress Notes (Signed)
Pt continues to refuse prevalon boots. Pt stating her foot is "burning". Pt took off boot and wants foot on pillow. Allevyn dressing in place. Pt stating she is comfortable currently. WCTM

## 2017-03-17 NOTE — Clinical Social Work Placement (Signed)
   CLINICAL SOCIAL WORK PLACEMENT  NOTE  Date:  03/17/2017  Patient Details  Name: [name] MRN: [id number] Date of Birth: [birth year]  Clinical Social Work is seeking post-discharge placement for this patient at the [place] level of care (*CSW will initial, date and re-position this form in  chart as items are completed):  Yes   Patient/family provided with [facility] Work Department's list of facilities offering this level of care within the geographic area requested by the patient (or if unable, by the patient's family).  Yes   Patient/family informed of their freedom to choose among providers that offer the needed level of care, that participate in Medicare, Medicaid or managed care program needed by the patient, have an available bed and are willing to accept the patient.  Yes   Patient/family informed of [facility]'s ownership interest in [facility] and [facility], as well as of the fact that they are under no obligation to receive care at these facilities.  PASRR submitted to EDS on 03/13/17     PASRR number received on 03/13/17     Existing PASRR number confirmed on       FL2 transmitted to all facilities in geographic area requested by pt/family on 03/13/17     FL2 transmitted to all facilities within larger geographic area on       Patient informed that his/her managed care company has contracts with or will negotiate with certain facilities, including the following:        Yes   Patient/family informed of bed offers received.  Patient chooses bed at [place], [place]     Physician recommends and patient chooses bed at      Patient to be transferred to [place] [place] on 03/17/17.  Patient to be transferred to facility by [facility]AR     Patient family notified on 03/17/17 of transfer.  Name of family member notified:  Daughter     PHYSICIAN       Additional Comment:     _______________________________________________ Benard Halsted, [place] 03/17/2017, 1:59 PM

## 2017-03-17 NOTE — Progress Notes (Signed)
Physical Therapy Treatment Patient Details Name: [name] MRN: [id number] DOB: [birth year] Today's Date: 03/17/2017    History of Present Illness [name] Overley Gilbertis a 83 y.o.femalewith medical history significantfor diabetes, hypertension, hyperlipidemia, anxiety, GERD, hypothyroidism, presenting after sustaining a mechanical fall onto her right hip. S/p IM intertrochanteric fixation of right hip, ORIF of right wrist 11/21. Postoperatively sustained NSTEMI and now with acute systolic CHF.     PT Comments    Patient seen for OOB activity progression. Patient tolerated 3 sit <> stands and pivot to [facility] and from [facility] to chair with +2 max assist. Patient remains limited by pain and anxiousness throughout session but is willing to progress.    Follow Up Recommendations  SNF     Equipment Recommendations  Wheelchair (measurements PT)    Recommendations for Other Services       Precautions / Restrictions Precautions Precautions: Fall Restrictions Weight Bearing Restrictions: Yes RUE Weight Bearing: Weight bear through elbow only RLE Weight Bearing: Weight bearing as tolerated Other Position/Activity Restrictions: no ROM at R wrist    Mobility  Bed Mobility Overal bed mobility: Needs Assistance Bed Mobility: Supine to Sit Rolling: Max assist   Supine to sit: Max assist     General bed mobility comments: Assist to bring legs to EOB and elevate trunk to upright  Transfers Overall transfer level: Needs assistance Equipment used: 2 person hand held assist Transfers: Sit to/from [employer] Transfers Sit to Stand: Max assist;+2 physical assistance;+2 safety/equipment Stand pivot transfers: Max assist;+2 physical assistance;+2 safety/equipment       General transfer comment: 2 person face to face with gait belt and chuck pad(performed x2)  Ambulation/Gait Ambulation/Gait assistance: (unable.  )           General Gait Details: refused   Stairs            Wheelchair Mobility    Modified Rankin (Stroke Patients Only)       Balance Overall balance assessment: Needs assistance Sitting-balance support: Single extremity supported;Feet supported Sitting balance-Leahy Scale: Poor Sitting balance - Comments: MinGuard-MinA for static sitting EOB; Required UE support or physical assistance. Postural control: Left lateral lean   Standing balance-Leahy Scale: Poor Standing balance comment: reliant on UE support/external support from therapist                            Cognition Arousal/Alertness: Awake/alert Behavior During Therapy: Anxious Overall Cognitive Status: No family/caregiver present to determine baseline cognitive functioning                                        Exercises General Exercises - Lower Extremity Ankle Circles/Pumps: AROM;Both;20 reps(VCs for continued performance in bed and OOB) Other Exercises Other Exercises: Encoruaged continued ROM activities Other Exercises: Educated patient on technique for return to bed    General Comments        Pertinent Vitals/Pain Pain Assessment: Faces Faces Pain Scale: Hurts whole lot Pain Location: Rigtjh hip operative site Pain Descriptors / Indicators: Aching Pain Intervention(s): Monitored during session    Home Living                      Prior Function            PT Goals (current goals can now be found in the care plan section) Acute Rehab  PT Goals Patient Stated Goal: to decrease pain PT Goal Formulation: With patient Time For Goal Achievement: 03/30/17 Potential to Achieve Goals: Fair Progress towards PT goals: Progressing toward goals    Frequency    Min 3X/week      PT Plan Current plan remains appropriate    Co-evaluation PT/OT/SLP Co-Evaluation/Treatment: Yes Reason for Co-Treatment: Complexity of the patient's impairments (multi-system involvement) PT goals addressed during session: Mobility/safety  with mobility OT goals addressed during session: ADL's and self-care      AM-PAC PT "6 Clicks" Daily Activity  Outcome Measure  Difficulty turning over in bed (including adjusting bedclothes, sheets and blankets)?: Unable Difficulty moving from lying on back to sitting on the side of the bed? : Unable Difficulty sitting down on and standing up from a chair with arms (e.g., wheelchair, bedside commode, etc,.)?: Unable Help needed moving to and from a bed to chair (including a wheelchair)?: A Lot Help needed walking in hospital room?: Total Help needed climbing 3-5 steps with a railing? : Total 6 Click Score: 7    End of Session Equipment Utilized During Treatment: Gait belt Activity Tolerance: Patient limited by pain Patient left: with call bell/phone within reach;with bed alarm set;in chair Nurse Communication: Mobility status PT Visit Diagnosis: Unsteadiness on feet (R26.81);Other abnormalities of gait and mobility (R26.89);History of falling (Z91.81);Muscle weakness (generalized) (M62.81);Pain Pain - Right/Left: Right Pain - part of body: Leg     Time: 9758-8325 PT Time Calculation (min) (ACUTE ONLY): 23 min  Charges:  $Therapeutic Activity: 8-22 mins                    G Codes:       Alben Deeds, PT DPT  Board Certified Neurologic Specialist [phone]    Duncan Dull 03/17/2017, 1:32 PM

## 2017-03-17 NOTE — Patient Outreach (Signed)
[facility]) Care Management  03/17/2017  [name] June 26, 1929 [id number]    CSW was able to make with patient's daughter, [name],  today to perform phone assessment, as well as assess and assist with social work needs and services.  CSW introduced self, explained role and types of services provided through [facility] ([facility]).   CSW then explained the reason for the call, indicating that patient was referred due to plans for SNF rehab. Per daughter, plans are for SNF rehab transfer today at [facility].  Daughter concerned that her insurance won't cover a long SNF stay; "She doesn't think she will walk again".  CSW advised the daughter that the SNF will update HTA on his progress with therapy and hopefully will extend coverage if needed.  CSW encouraged daughter to encourage and motivate her to work with therapies.  CSW will plan SNF visit next week and complete full assessment.     Eduard Clos, MSW, [place] Worker  [place] [phone]

## 2017-03-17 NOTE — Progress Notes (Signed)
Patient will DC to: [facility] Anticipated DC date: 03/17/17 Family notified: Daughter Transport by: Corey Harold 3:30pm   Per MD patient ready for DC to [facility]. RN, patient, patient's family, and facility notified of DC. Discharge Summary sent to facility. RN given number for report [phone]) DC packet on chart. Ambulance transport requested for patient.   CSW signing off.  Cedric Fishman, [place] Social Worker [phone]

## 2017-03-17 NOTE — Discharge Summary (Signed)
Physician Discharge Summary  ROWEN HUR UDJ:[id number] DOB: 15-Jul-1929 DOA: 03/01/2017  PCP: Jinny Sanders, MD  Admit date: 03/01/2017 Discharge date: 03/17/2017  Admitted From: Home Disposition:  SNF  Recommendations for Outpatient Follow-up:  1. Follow up with PCP in 1- week 2. Patient has been placed on furosemide daily 3. Follow renal panel in 3 days.  Home Health: No Equipment/Devices: no   Discharge Condition: stable CODE STATUS: full  Diet recommendation:  Heart healthy and diabetic prudent  Brief/Interim Summary: 83 year old female who presented after a mechanical fall. She does have a significant past medical history of type 2 diabetes mellitus, hypertension, dyslipidemia, anxiety, hypothyroidism and GERD. Patient sustained a mechanical fall while ambulating, no head trauma or loss of consciousness.Post trauma patient was not able to ambulate. On her initial physical examination blood pressure 135/85, heart rate 105, temperature 98.9, respiratory 22, oxygen saturation 94%.Moist mucous membranes, lungs were clear to auscultation bilaterally, no wheezing, rales or rhonchi, heart S1-S2 present and rhythmic, no gallops, abdomen was soft nontender, no lower extremity, right leg was externally rotated.Imaging showed acute comminuted intratrochanteric fracture of the proximal right femur.Comminuted fracture of the distal right radius.sodium 135, potassium 4.4, chloride 14, bicarbonate 21, glucose 177, bUN 18, creatinine 0.91. White count 19.5, hemoglobin 12.5.5, platelets 327. Urinalysis negative for infection. Chest x-ray negative for infiltrates. EKG sinus rhythm, rate 92 bpm, small inferior lateral Q waves, normal axis and normal intervals.   Patient underwent intertrochanteric fixation right hip open reduction fixation right wrist,November 21. Her postoperative was complicated by non-ST elevation myocardial infarction.Which was treated medically, due to gastrointestinal  bleeding and symptomatic anemia.  Patient had a prolonged hospital stay, she has been evaluated by physical therapy, and will be discharged to a skilled nursing facility.   1. Right comminuted distal radius and intertrochanteric femur fracture. Patient was admitted to medical ward, she received analgesia and DVT prophylaxis. Patient was seen by orthopedics and underwent intramedullary nail intertrochanteric and open reduction-internal fixation of right wrist fracture.   2. Non-ST elevation myocardial infarction. 4 days after surgical intervention, patient developed significant dyspnea, further workup revealed anterolateral ST depressions. Troponins were found to be elevated, peak at 9.0. Further workup with echocardiography showed a significant reduction on the LV systolic function down to 20-25%, with severe hypokinesis of the mid to apical-anterior septal, inferior and inferior septal myocardium. Cardiology was consulted with recommendations for aggressive medical therapy, patient tolerated well aspirin, atorvastatin, carvedilol and losartan.   3. Acute systolic heart failure decompensation. Patient had a significant decrease in her LV systolic function, developed cardiogenic pulmonary edema, she was placed on furosemide for diuresis achieving negative fluid balance of -13,709 by day of her discharge. Continue carvedilol, losartan and furosemide.   3. Acute blood loss anemia, due to GI bleed. Patient had significant drop on her hemoglobin, down to 7.5 by November 25. She tested positive for Hemoccult blood. Received 2 units of packed red blood cells, with improvement of her hemoglobin. Further workup with CT of the abdomen showed no intra-abdominal bleed. Patient will follow-up as an outpatient with gastroenterology, for possible endoscopic procedure.   4. Acute kidney injury chronic kidney disease stage II. Patient received supportive medical therapy, including diuretics, kidney function improved,  with a discharge creatinine of 0.65 and serum potassium 4.4. Patient will need close follow-up as an outpatient.  5. Type 2 diabetes mellitus. Patient was placed on insulin sliding scale for glucose coverage and monitoring, continue basal insulin therapy. Capillary glucose remained well-controlled.  6. Sacral and right heel stage I ulcer. Patient to receive local wound care   Discharge Diagnoses:  Principal Problem:   Closed right hip fracture ([place]) Active Problems:   Secondary DM with neurological manifestations ([place])   Diabetic peripheral neuropathy associated with type 2 diabetes mellitus ([facility])   Hyperlipidemia   GERD   Chronic constipation   Systolic murmur   Falls frequently   Pressure injury of skin   Occult blood positive stool   Acute systolic congestive heart failure [facility])    Discharge Instructions  Discharge Instructions    AMB Referral to [facility]   Complete by:  As directed    Reason for consult:  Patient High Risk in the HTA plan   Expected date of contact:  1-3 days (reserved for hospital discharges)   Please assign patient to social worker for post hospital follow up at skilled facility. Patient is in the [place], Dr. Reynaldo Minium.  Patient approved for [facility]  Please assess if nursing will be needed.  Daughter gave verbal consent at this time. Awaiting to speak with inpatient CSW.  Pharmacy:  Please assign and Please complete a 30 day post-discharge medication reconciliation on this patient to help lower risk of readmission    Questions please call:   Natividad Brood, RN BSN [facility] Liaison  [phone] business mobile phone Toll free office [phone]     Allergies as of 03/17/2017      Reactions   Codeine    Rapid heart rate   Metformin    unspecified   Penicillins    unspecified   Pioglitazone    REACTION: rash   Fenofibrate Palpitations      Medication List    STOP taking these  medications   alendronate 70 MG tablet Commonly known as:  FOSAMAX   lisinopril 5 MG tablet Commonly known as:  PRINIVIL,ZESTRIL   pantoprazole 40 MG tablet Commonly known as:  PROTONIX     TAKE these medications   acetaminophen 500 MG tablet Commonly known as:  TYLENOL Take 2 tabs po q8 hours x 14 days following surgery   aspirin 81 MG tablet Take 81 mg by mouth daily.   atorvastatin 40 MG tablet Commonly known as:  LIPITOR Take 1 tablet (40 mg total) by mouth daily at 6 PM.   calcium carbonate 1250 (500 Ca) MG chewable tablet Commonly known as:  OS-CAL Chew 1 tablet by mouth daily.   carvedilol 6.25 MG tablet Commonly known as:  COREG Take 1 tablet (6.25 mg total) by mouth 2 (two) times daily with a meal.   enoxaparin 40 MG/0.4ML injection Commonly known as:  LOVENOX Inject 0.4 mLs (40 mg total) into the skin daily.   feeding supplement (GLUCERNA SHAKE) Liqd Take 237 mLs by mouth 2 (two) times daily between meals.   furosemide 20 MG tablet Commonly known as:  LASIX Take 1 tablet (20 mg total) by mouth daily.   insulin aspart 100 UNIT/ML FlexPen Commonly known as:  NOVOLOG FLEXPEN Inject 10 Units into the skin 3 (three) times daily with meals.   insulin NPH Human 100 UNIT/ML injection Commonly known as:  HUMULIN N,NOVOLIN N Inject 15 Units into the skin at bedtime.   losartan 25 MG tablet Commonly known as:  COZAAR Take 1 tablet (25 mg total) by mouth daily.   omeprazole 20 MG capsule Commonly known as:  PRILOSEC Take 20 mg by mouth 2 (two) times daily before a  meal.   ondansetron 4 MG tablet Commonly known as:  ZOFRAN Take 1 tablet (4 mg total) by mouth every 8 (eight) hours as needed for nausea or vomiting.   oxyCODONE 5 MG immediate release tablet Commonly known as:  ROXICODONE Take 1-2 tabs po q6-8 hours prn pain.  Do not exceed 6 tabs per day   polyethylene glycol packet Commonly known as:  MIRALAX / GLYCOLAX Take 17 g by mouth daily.        Contact information for follow-up providers    Hiram Gash, MD. Schedule an appointment as soon as possible for a visit in 10 days.   Specialty:  Orthopedic Surgery Contact information: [address] [place] [address] [phone]            Contact information for after-discharge care    Destination    [facility] .   Service:  Skilled Nursing Contact information: [place] [phone]                 Allergies  Allergen Reactions  . Codeine     Rapid heart rate  . Metformin     unspecified  . Penicillins     unspecified  . Pioglitazone     REACTION: rash  . Fenofibrate Palpitations    Consultations:  Orthopedics  Cardiology    Procedures/Studies: Ct Abdomen Pelvis Wo Contrast  Result Date: 03/06/2017 CLINICAL DATA:  83 y/o  F; rule out retroperitoneal bleed. EXAM: CT ABDOMEN AND PELVIS WITHOUT CONTRAST TECHNIQUE: Multidetector CT imaging of the abdomen and pelvis was performed following the standard protocol without IV contrast. COMPARISON:  07/11/2007 CT abdomen and pelvis FINDINGS: Lower chest: Small hiatal hernia. Small bilateral pleural effusions. Dependent atelectasis in lower lobes. Severe coronary artery calcification. Aortic valvular calcification associated with aortic stenosis. Hepatobiliary: No focal liver abnormality is seen. No gallstones, gallbladder wall thickening, or biliary dilatation. Pancreas: Unremarkable. No pancreatic ductal dilatation or surrounding inflammatory changes. Spleen: Normal in size without focal abnormality. Adrenals/Urinary Tract: Normal adrenal glands. Right kidney interpolar 21 mm cyst. No hydronephrosis. Collapsed bladder around Foley catheter. Stomach/Bowel: Stomach is within normal limits. Appendix appears normal. Fat stranding within the mesial rectal fat and mild rectal wall thickening. Otherwise no evidence of bowel wall  thickening, distention, or inflammatory changes. Vascular/Lymphatic: Aortic atherosclerosis. No enlarged abdominal or pelvic lymph nodes. Reproductive: Status post hysterectomy. No adnexal masses. Other: No abdominal wall hernia or abnormality. No abdominopelvic ascites. Musculoskeletal: Subcutaneous fat stranding within the left lower abdominal wall and flanks compatible with edema, probably third-spacing. Partially visualize comminuted fracture of the right proximal femur extending into proximal diaphysis and intertrochanteric region. Right femoral intramedullary nail and femoral neck screw. IMPRESSION: 1. No retroperitoneal hematoma identified. 2. Mesorectal fat stranding and rectal wall thickening may represent proctitis. 3. Partially visualize comminuted fracture of right proximal femur involving intertrochanteric region and proximal diaphysis with intramedullary nail fixation. 4. Small hiatal hernia. 5. Small bilateral pleural effusions. 6. Severe coronary and aortic calcific atherosclerosis. 7. Aortic valvular stenosis associated with aortic stenosis. Electronically Signed   By: Kristine Garbe M.D.   On: 03/06/2017 20:58   Dg Chest 1 View  Result Date: 03/01/2017 CLINICAL DATA:  [place] this morning. EXAM: CHEST 1 VIEW COMPARISON:  04/20/2012 FINDINGS: Cardiac enlargement. No vascular congestion or edema. Linear fibrosis or atelectasis in the lung bases. No focal consolidation. No blunting of costophrenic angles. No pneumothorax. Calcification of the aorta. Degenerative  changes in the spine and shoulders. IMPRESSION: Fibrosis or atelectasis in the lung bases.  No focal consolidation. Electronically Signed   By: Lucienne Capers M.D.   On: 03/01/2017 05:04   Dg Wrist 2 Views Right  Result Date: 03/15/2017 CLINICAL DATA:  Status post right wrist surgery after fall. EXAM: RIGHT WRIST - 2 VIEW COMPARISON:  Radiographs of March 02, 2017. FINDINGS: Right wrist remains splinted. Status post  surgical internal fixation of distal radial fracture. Stable moderately displaced ulnar styloid fracture is noted. IMPRESSION: Status post splinting and internal fixation of distal right radial fracture. No changes noted compared to prior exam. Electronically Signed   By: Marijo Conception, M.D.   On: 03/15/2017 10:19   Dg Wrist 2 Views Right  Result Date: 03/02/2017 CLINICAL DATA:  Open reduction internal fixation for fracture EXAM: RIGHT WRIST - 2 VIEW COMPARISON:  March 01, 2017 FINDINGS: Frontal and lateral views were obtained. There is screw and plate fixation through a fracture of the distal radius with alignment near anatomic. There is avulsion the ulnar styloid. No new fracture. No dislocation. Joint spaces appear remarkable overall. IMPRESSION: Screw and plate fixation through comminuted fracture distal radial metaphysis with alignment near anatomic. Avulsion ulnar styloid. No new fracture. No dislocation. The joint spaces overall appear unremarkable. Electronically Signed   By: Lowella Grip III M.D.   On: 03/02/2017 08:15   Dg Wrist Complete Right  Result Date: 03/01/2017 CLINICAL DATA:  Fall with right wrist pain EXAM: RIGHT WRIST - COMPLETE 3+ VIEW COMPARISON:  None. FINDINGS: There is a impacted, comminuted fracture of the distal right radius. There is a minimally displaced fracture of the right ulnar styloid. There is moderate circumferential soft tissue swelling. No carpal fracture. IMPRESSION: Impacted, comminuted fracture of the distal right radius and minimally displaced fracture of the ulnar styloid. Electronically Signed   By: Ulyses Jarred M.D.   On: 03/01/2017 05:01   Dg Abd 1 View  Result Date: 03/05/2017 CLINICAL DATA:  Constipation. EXAM: ABDOMEN - 1 VIEW COMPARISON:  None. FINDINGS: The bowel gas pattern is normal. Moderate amount of stool seen throughout the colon and rectum. Atherosclerosis of abdominal aorta is noted. No nephrolithiasis is noted. IMPRESSION: Moderate  stool burden is noted. No evidence of bowel obstruction or ileus. Electronically Signed   By: Marijo Conception, M.D.   On: 03/05/2017 09:41   US Renal  Result Date: 03/06/2017 CLINICAL DATA:  Acute kidney injury and history of diabetes. EXAM: RENAL / URINARY TRACT ULTRASOUND COMPLETE COMPARISON:  None. FINDINGS: Right Kidney: Length: 11.8 cm. Mild cortical thinning with normal echogenicity. No hydronephrosis. Simple cyst emanating from the lower pole measures 1.6 cm. No solid masses identified. Left Kidney: Length: 11.5 cm. Mild cortical thinning with normal echogenicity. No hydronephrosis. No solid masses identified. Bladder: The bladder is decompressed by a Foley catheter. IMPRESSION: Mild cortical atrophy of both kidneys. No evidence of renal obstruction. Electronically Signed   By: Aletta Edouard M.D.   On: 03/06/2017 07:59   Ct Femur Right Wo Contrast  Result Date: 03/07/2017 CLINICAL DATA:  Right femur fracture.  Right femur pain. EXAM: CT OF THE LOWER RIGHT EXTREMITY WITHOUT CONTRAST TECHNIQUE: Multidetector CT imaging of the right lower extremity was performed according to the standard protocol. COMPARISON:  None. FINDINGS: Bones/Joint/Cartilage Comminuted right intertrochanteric fracture with subtrochanteric extension transfixed with a intramedullary nail and interlocking nails. No hardware failure or complication. Fracture is in anatomic alignment. No other fracture or dislocation. Joint space narrowing  of the right hip. Normal alignment. Small knee joint effusion. Mild medial femorotibial compartment joint space narrowing. Ligaments Ligaments are suboptimally evaluated by CT. Muscles and Tendons Muscles are normal. No muscle atrophy. No intramuscular fluid collection or hematoma. Soft tissue Soft tissue edema in the subcutaneous fat along of the right thigh consistent with postsurgical changes. No fluid collection or hematoma. No soft tissue mass. Peripheral vascular atherosclerotic disease.  IMPRESSION: Comminuted right intertrochanteric fracture with subtrochanteric extension transfixed with a intramedullary nail and interlocking nails. No hardware failure or complication. Fracture is in anatomic alignment. Soft tissue edema in the subcutaneous fat along of the right thigh consistent with postsurgical changes versus mild cellulitis. Electronically Signed   By: Kathreen Devoid   On: 03/07/2017 09:13   Dg Chest Port 1 View  Result Date: 03/14/2017 CLINICAL DATA:  Patient sustained an MI 2 weeks ago following hip surgery. The patient reports onset of shortness of breath and chest congestion with nonproductive cough. History of diabetes, CHF, former smoker. EXAM: PORTABLE CHEST 1 VIEW COMPARISON:  Portable chest x-ray of March 11, 2017 FINDINGS: The lungs are well-expanded. Small bilateral pleural effusions persist. The interstitial markings remain increased but are slightly less conspicuous today. The cardiac silhouette remains enlarged. The pulmonary vascularity is mildly prominent centrally. There is calcification in the wall of the thoracic aorta. IMPRESSION: CHF with mild interstitial edema improved since yesterday's study. Stable small bilateral pleural effusions. No overt pneumonia. Thoracic aortic atherosclerosis. Electronically Signed   By: David  Martinique M.D.   On: 03/14/2017 13:49   Dg Chest Port 1 View  Result Date: 03/11/2017 CLINICAL DATA:  Shortness of breath. EXAM: PORTABLE CHEST 1 VIEW COMPARISON:  03/05/2017 FINDINGS: 1418 hours. Low volume film. The cardio pericardial silhouette is enlarged. There is pulmonary vascular congestion without overt pulmonary edema. Diffuse interstitial and basilar airspace disease likely related to pulmonary edema although infection at the bases not excluded. Small bilateral pleural effusions are associated. Bones are diffusely demineralized. Left clavicle fracture was present on the prior study. Telemetry leads overlie the chest. IMPRESSION: 1.  Cardiomegaly with interstitial and basilar airspace disease suggesting edema with associated small bilateral pleural effusions. 2. Left clavicle fracture. Electronically Signed   By: Misty Stanley M.D.   On: 03/11/2017 14:37   Dg Chest Port 1 View  Result Date: 03/05/2017 CLINICAL DATA:  Patient with shortness of breath. EXAM: PORTABLE CHEST 1 VIEW COMPARISON:  Chest radiograph 03/01/2017 FINDINGS: Monitoring leads overlie the patient. Patient is rotated to the left. Stable enlarged cardiac and mediastinal contours. Aortic atherosclerosis. Small left pleural effusion. Left basilar heterogeneous opacities. No pneumothorax. IMPRESSION: Small left pleural effusion with underlying opacities favored to represent atelectasis. Cardiomegaly. Electronically Signed   By: Lovey Newcomer M.D.   On: 03/05/2017 14:09   Dg Hand Complete Right  Result Date: 03/01/2017 CLINICAL DATA:  Fall EXAM: RIGHT HAND - COMPLETE 3+ VIEW COMPARISON:  None. FINDINGS: The bones are osteopenic. There is no fracture or dislocation of the bones of the right hand. Fracture of the right wrist is characterized more completely on the dedicated wrist study. No advanced osteoarthrosis. No erosions. IMPRESSION: Osteopenia without acute fracture or dislocation of the right hand. Electronically Signed   By: Ulyses Jarred M.D.   On: 03/01/2017 04:59   Dg C-arm 1-60 Min  Result Date: 03/01/2017 CLINICAL DATA:  Right femoral nail EXAM: DG C-ARM 61-120 MIN; RIGHT FEMUR 2 VIEWS COMPARISON:  03/01/2017 FINDINGS: Multiple intraoperative spot images demonstrate internal fixation across the proximal  right femoral fracture. No hardware complicating feature. Near anatomic alignment. IMPRESSION: Internal fixation across the proximal right femoral fracture. Electronically Signed   By: Rolm Baptise M.D.   On: 03/01/2017 11:42   Dg Hip Unilat With Pelvis 2-3 Views Right  Result Date: 03/15/2017 CLINICAL DATA:  Status post internal fixation of right hip  fracture. EXAM: DG HIP (WITH OR WITHOUT PELVIS) 2-3V RIGHT COMPARISON:  Radiographs March 01, 2017. FINDINGS: Status post intramedullary rod fixation of proximal right femoral shaft fracture. Persistent fracture line is noted and no significant callus formation is noted. There appears to be mild displacement of the fracture compared to prior exam, with increased bone resorption involving its medial margins. Vascular calcifications are noted. IMPRESSION: Status post intramedullary rod fixation of right proximal femoral shaft fracture. Persistent fracture line is noted, with mildly increased displacement of the fracture components compared to prior exam, an increased bone resorption involving its medial margins. No significant callus formation is noted. Electronically Signed   By: Marijo Conception, M.D.   On: 03/15/2017 10:30   Dg Hip Unilat With Pelvis 2-3 Views Right  Result Date: 03/01/2017 CLINICAL DATA:  Status post ORIF of right hip fracture EXAM: DG HIP (WITH OR WITHOUT PELVIS) 3V RIGHT COMPARISON:  03/01/2017 FINDINGS: Medullary rod and compression screw are now seen in the proximal right femur. Fracture fragments are in near anatomic alignment. Pelvic ring is intact. No other focal abnormality is seen. IMPRESSION: Status post ORIF of right femoral fracture Electronically Signed   By: Inez Catalina M.D.   On: 03/01/2017 14:00   Dg Hip Unilat W Or Wo Pelvis 2-3 Views Right  Result Date: 03/01/2017 CLINICAL DATA:  Lateral right hip pain after a fall this morning. EXAM: DG HIP (WITH OR WITHOUT PELVIS) 2-3V RIGHT COMPARISON:  None. FINDINGS: Comminuted inter trochanteric fractures of the proximal right femur with varus angulation of the fracture fragments. Displacement of greater and lesser trochanteric fragments. No dislocation at the hip joint. Degenerative changes in the lower lumbar spine and in both hips. Pelvis appears intact. SI joints and symphysis pubis are not displaced. Prominent vascular  calcifications. IMPRESSION: Acute comminuted inter trochanteric fractures of the proximal right femur with varus angulation. Electronically Signed   By: Lucienne Capers M.D.   On: 03/01/2017 05:03   Dg Femur, Min 2 Views Right  Result Date: 03/01/2017 CLINICAL DATA:  Right femoral nail EXAM: DG C-ARM 61-120 MIN; RIGHT FEMUR 2 VIEWS COMPARISON:  03/01/2017 FINDINGS: Multiple intraoperative spot images demonstrate internal fixation across the proximal right femoral fracture. No hardware complicating feature. Near anatomic alignment. IMPRESSION: Internal fixation across the proximal right femoral fracture. Electronically Signed   By: Rolm Baptise M.D.   On: 03/01/2017 11:42       Subjective: Patient feeling well, dyspnea has improved, tolerating po well, no nausea or vomiting, no chest pain.   Discharge Exam: Vitals:   03/17/17 0839 03/17/17 0912  BP: 123/61 121/60  Pulse: 82 83  Resp:  18  Temp:  97.8 F (36.6 C)  SpO2:  96%   Vitals:   03/17/17 0700 03/17/17 0728 03/17/17 0839 03/17/17 0912  BP:   123/61 121/60  Pulse:   82 83  Resp:    18  Temp:    97.8 F (36.6 C)  TempSrc:    Oral  SpO2:  92%  96%  Weight: 68.5 kg (151 lb)     Height:        General: Pt is alert, awake,  not in acute distress E ENT. Mild pallor, no icterus, oral mucosa moist.  Cardiovascular: RRR, S1/S2 +, no rubs, no gallops Respiratory: CTA bilaterally, no wheezing, no rhonchi. Mild decreased breath sounds at bases.  Abdominal: Soft, NT, ND, bowel sounds + Extremities: trace edema, no cyanosis    The results of significant diagnostics from this hospitalization (including imaging, microbiology, ancillary and laboratory) are listed below for reference.     Microbiology: No results found for this or any previous visit (from the past 240 hour(s)).   Labs: BNP (last 3 results) Recent Labs    03/05/17 1346  BNP 3,428.7*   Basic Metabolic Panel: Recent Labs  Lab 03/13/17 0332 03/14/17 0401  03/15/17 0625 03/16/17 0446 03/17/17 0236  NA 131* 131* 133* 135 132*  K 4.4 4.4 4.3 4.2 4.4  CL 95* 94* 95* 97* 96*  CO2 29 28 28 30 30   GLUCOSE 128* 87 99 103* 118*  BUN 20 21* 18 18 18   CREATININE 0.66 0.66 0.64 0.62 0.65  CALCIUM 8.0* 8.1* 8.3* 8.3* 8.3*   Liver Function Tests: No results for input(s): AST, ALT, ALKPHOS, BILITOT, PROT, ALBUMIN in the last 168 hours. No results for input(s): LIPASE, AMYLASE in the last 168 hours. No results for input(s): AMMONIA in the last 168 hours. CBC: Recent Labs  Lab 03/12/17 0422 03/13/17 0332 03/14/17 0401 03/15/17 0625 03/16/17 0446  WBC 15.1* 14.0* 11.7* 11.4* 10.6*  NEUTROABS 10.7* 9.6* 7.0 6.9 7.0  HGB 10.8* 10.1* 10.1* 10.8* 10.4*  HCT 34.1* 32.1* 32.5* 34.7* 33.4*  MCV 84.8 84.7 84.9 85.3 85.0  PLT 360 349 336 381 389   Cardiac Enzymes: No results for input(s): CKTOTAL, CKMB, CKMBINDEX, TROPONINI in the last 168 hours. BNP: Invalid input(s): POCBNP CBG: Recent Labs  Lab 03/16/17 1239 03/16/17 1702 03/16/17 2024 03/16/17 2046 03/17/17 0812  GLUCAP 125* 131* 59* 72 109*   D-Dimer No results for input(s): DDIMER in the last 72 hours. Hgb A1c No results for input(s): HGBA1C in the last 72 hours. Lipid Profile No results for input(s): CHOL, HDL, LDLCALC, TRIG, CHOLHDL, LDLDIRECT in the last 72 hours. Thyroid function studies No results for input(s): TSH, T4TOTAL, T3FREE, THYROIDAB in the last 72 hours.  Invalid input(s): FREET3 Anemia work up No results for input(s): VITAMINB12, FOLATE, FERRITIN, TIBC, IRON, RETICCTPCT in the last 72 hours. Urinalysis    Component Value Date/Time   COLORURINE YELLOW 03/01/2017 1847   APPEARANCEUR HAZY (A) 03/01/2017 1847   LABSPEC 1.025 03/01/2017 1847   PHURINE 5.0 03/01/2017 1847   GLUCOSEU NEGATIVE 03/01/2017 1847   HGBUR NEGATIVE 03/01/2017 1847   HGBUR negative 08/11/2009 0000   BILIRUBINUR NEGATIVE 03/01/2017 1847   BILIRUBINUR negative 11/04/2014 1033    KETONESUR NEGATIVE 03/01/2017 1847   PROTEINUR 30 (A) 03/01/2017 1847   UROBILINOGEN 0.2 11/04/2014 1033   UROBILINOGEN 0.2 08/11/2009 0000   NITRITE NEGATIVE 03/01/2017 1847   LEUKOCYTESUR NEGATIVE 03/01/2017 1847   Sepsis Labs Invalid input(s): PROCALCITONIN,  WBC,  LACTICIDVEN Microbiology No results found for this or any previous visit (from the past 240 hour(s)).   Time coordinating discharge: 45 minutes  SIGNED:   Tawni Millers, MD  [facility] 03/17/2017, 12:16 PM Pager [phone]  If 7PM-7AM, please contact night-coverage [email or web address] Password [email or web address]

## 2017-03-18 DIAGNOSIS — E119 Type 2 diabetes mellitus without complications: Secondary | ICD-10-CM | POA: Diagnosis not present

## 2017-03-18 DIAGNOSIS — L89159 Pressure ulcer of sacral region, unspecified stage: Secondary | ICD-10-CM | POA: Diagnosis not present

## 2017-03-18 DIAGNOSIS — S72143A Displaced intertrochanteric fracture of unspecified femur, initial encounter for closed fracture: Secondary | ICD-10-CM | POA: Diagnosis not present

## 2017-03-18 DIAGNOSIS — K5909 Other constipation: Secondary | ICD-10-CM | POA: Diagnosis not present

## 2017-03-18 DIAGNOSIS — D6489 Other specified anemias: Secondary | ICD-10-CM | POA: Diagnosis not present

## 2017-03-18 DIAGNOSIS — L89629 Pressure ulcer of left heel, unspecified stage: Secondary | ICD-10-CM | POA: Diagnosis not present

## 2017-03-18 DIAGNOSIS — I5022 Chronic systolic (congestive) heart failure: Secondary | ICD-10-CM | POA: Diagnosis not present

## 2017-03-21 ENCOUNTER — Telehealth: Payer: Self-pay | Admitting: Pharmacist

## 2017-03-21 NOTE — Patient Outreach (Signed)
[place] [facility]) Care Management  03/21/17 [name] [birth year]   83 year old female active in [place] registry noted to have been recently hospitalized for right comminuted distal radius and intertrochanteric femur fracture following a mechanical fall. Hospital course complicated by NSTEMI treated medically due to GIB and symptomatic anemia.    PMHx includes, but not limited to,chronic kidney disease stage II,  type 2 diabetes mellitus, HTN, HLD, anxiety, hypothyroidism, and GERD.  Patient discharged to SNF.    [facility] pharmacy referred for 30 day post discharge medication reconciliation.   Successful telephone call to Ms. [name]'s daughter in law. HIPAA identifiers verified. She reports patient is doing well and has no medication issues at the moment.    Successful telephone call to [facility]. HIPAA identifiers verified.  I spoke with Ms. [name] RN and reviewed all medications.  No issues or concerns currently with medications per RN.    Objective: SCr = 0.65 (04/06/17), CrCl ~ 44 ml/min (rounded SCr 1 for age) Hemoglobin A1c = 6.6 (03/01/17)  ASSESSMENT: Date Discharged from Hospital: 03/17/2017 Date Medication Reconciliation Performed: 03/21/17  Patient was recently discharged from hospital and all medications have been reviewed.  Medications Discontinued at Discharge:  Alendronate  Lisinopril  Pantoprazole  New Medications at Discharge:   Acetaminophen  Atorvastatin  Carvedilol  Enoxaparin  Feeding supplement  Furosemide  Losartan  Ondansetron  Oxycodone  Polyethylene glycol  Continued Medications at Discharge:  Aspirin  Calcium carbonate  Novolog  Insulin NPH  Omeprazole   Drugs sorted by system:  Neurologic/Psychologic:  Cardiovascular: Atorvastatin, carvedilol, furosemide, losartan, aspirin  Pulmonary/Allergy: none  Gastrointestinal: Omeprazole, ondansetron, polyethylene glycol, feeding  supplement  Endocrine: Novolog, Insulin NPH  Renal: none  Topical: none  Pain:Acetaminophen, oxycodone  Vitamins/Minerals:Calcium carbonate  Infectious Diseases:none  Miscellaneous: Enoxaparin   Renal dosing: No issues Gaps in therapy: No issues Duplications in therapy: No issues Medications to avoid in the elderly:  Oxycodone: Per Beers, avoid use in geriatric patients with a history of falls or fractures except for pain management due to recent fractures or joint replacement. Patients may be at increased risk for respiratory depression.   -Patient using for recent fracture.  Monitor closely and adjust as clinically warranted.    Drug interactions: No issues Other issues noted: No issues  Two medication changes at SNF:  -Oxycodone scheduled BID + PRN -Milk of magnesia PRN constipation  Plan: Medications reviewed electronically and with patient's RN. All questions answered.   I will route my note to PCP and close patient case.   Thank you for allowing [facility] services to be part of this patient's care.   Ralene Bathe, PharmD, [place] [phone]

## 2017-03-22 ENCOUNTER — Other Ambulatory Visit: Payer: Self-pay | Admitting: *Deleted

## 2017-03-22 DIAGNOSIS — L89621 Pressure ulcer of left heel, stage 1: Secondary | ICD-10-CM | POA: Diagnosis not present

## 2017-03-22 DIAGNOSIS — L89322 Pressure ulcer of left buttock, stage 2: Secondary | ICD-10-CM | POA: Diagnosis not present

## 2017-03-22 DIAGNOSIS — L89611 Pressure ulcer of right heel, stage 1: Secondary | ICD-10-CM | POA: Diagnosis not present

## 2017-03-23 DIAGNOSIS — S52571D Other intraarticular fracture of lower end of right radius, subsequent encounter for closed fracture with routine healing: Secondary | ICD-10-CM | POA: Diagnosis not present

## 2017-03-23 NOTE — Patient Outreach (Signed)
[place]) Care Management  03/23/2017  [name] [birth year] [id number]    CSW went to [facility] on 03/22/17 to meet patient.  CSW introduced self and role and left packet for her review. CSW also spoke with SNF rep who reports plans for a family meeting to discuss her progress and planning for d/c needs- At this time there is no certain dc plan or date; however the patient is hopeful to get home soon.  CSW provided encouragement to her to help motivate her rehab progress.  Daughter was not at [facility] but CSW did call and left message for her to update CSW after the SNF meeting.    CSW will plan a f/u call or visit next week.    Eduard Clos, MSW, [place] Worker  [place] [phone]

## 2017-03-27 ENCOUNTER — Other Ambulatory Visit: Payer: Self-pay | Admitting: *Deleted

## 2017-03-28 DIAGNOSIS — L89621 Pressure ulcer of left heel, stage 1: Secondary | ICD-10-CM | POA: Diagnosis not present

## 2017-03-28 DIAGNOSIS — L89611 Pressure ulcer of right heel, stage 1: Secondary | ICD-10-CM | POA: Diagnosis not present

## 2017-03-28 DIAGNOSIS — L89322 Pressure ulcer of left buttock, stage 2: Secondary | ICD-10-CM | POA: Diagnosis not present

## 2017-03-31 ENCOUNTER — Other Ambulatory Visit: Payer: Self-pay | Admitting: *Deleted

## 2017-03-31 NOTE — Patient Outreach (Signed)
[place] [facility]) Care Management  03/31/2017  [name] [birth year] [id number]   CSW contacted SNF who reports dc home today. [facility] has been set up and daughter reports she is at her home in [place] at [place], [place].  Daughter feels confident she can manage; states she just needs the therapy. CSW reminded her [facility] is a separate program and she has the packet CSW left with patient at SNF and will review.  CSW will call next week for update and to determine their needs and interest in further [facility] CSW and other disciplines support.   Eduard Clos, MSW, [place] Worker  [place] [phone]

## 2017-04-03 ENCOUNTER — Other Ambulatory Visit: Payer: Self-pay | Admitting: *Deleted

## 2017-04-03 ENCOUNTER — Other Ambulatory Visit: Payer: Self-pay

## 2017-04-03 ENCOUNTER — Ambulatory Visit: Payer: PPO | Admitting: Family Medicine

## 2017-04-03 NOTE — Patient Outreach (Signed)
Transition of care:  Unknown date of discharge from SNF.  Placed call to patient with no answer. No machine.  PLAN: Will outreach patient on next business day.  Tomasa Rand, RN, BSN, CEN [facility] [employer] [phone]

## 2017-04-03 NOTE — Patient Outreach (Signed)
[name]) Care Management  04/03/2017  [name] 1929/06/28 [id number]   CSW spoke with patient's daughter by phone. She has heard from [place] who plans to visit on Wednesday.  Patient has gotten all RX filled and daughter reports she is doing well- "eating better". CSW will consult [facility] RNCM for follow up given concerns related to wounds (daughter is treating), CHF and patient's high risk status.  CSW plans f/u call later this week for update and to assess for further CSW needs.    Eduard Clos, MSW, [place] Worker  [place] [phone]

## 2017-04-06 ENCOUNTER — Other Ambulatory Visit: Payer: Self-pay | Admitting: *Deleted

## 2017-04-06 ENCOUNTER — Other Ambulatory Visit: Payer: Self-pay

## 2017-04-06 DIAGNOSIS — I130 Hypertensive heart and chronic kidney disease with heart failure and stage 1 through stage 4 chronic kidney disease, or unspecified chronic kidney disease: Secondary | ICD-10-CM | POA: Diagnosis not present

## 2017-04-06 DIAGNOSIS — I5020 Unspecified systolic (congestive) heart failure: Secondary | ICD-10-CM | POA: Diagnosis not present

## 2017-04-06 DIAGNOSIS — D631 Anemia in chronic kidney disease: Secondary | ICD-10-CM | POA: Diagnosis not present

## 2017-04-06 DIAGNOSIS — E1122 Type 2 diabetes mellitus with diabetic chronic kidney disease: Secondary | ICD-10-CM | POA: Diagnosis not present

## 2017-04-06 DIAGNOSIS — K5909 Other constipation: Secondary | ICD-10-CM | POA: Diagnosis not present

## 2017-04-06 DIAGNOSIS — S72141D Displaced intertrochanteric fracture of right femur, subsequent encounter for closed fracture with routine healing: Secondary | ICD-10-CM

## 2017-04-06 DIAGNOSIS — Z87891 Personal history of nicotine dependence: Secondary | ICD-10-CM | POA: Diagnosis not present

## 2017-04-06 DIAGNOSIS — Z7982 Long term (current) use of aspirin: Secondary | ICD-10-CM | POA: Diagnosis not present

## 2017-04-06 DIAGNOSIS — I252 Old myocardial infarction: Secondary | ICD-10-CM | POA: Diagnosis not present

## 2017-04-06 DIAGNOSIS — N182 Chronic kidney disease, stage 2 (mild): Secondary | ICD-10-CM | POA: Diagnosis not present

## 2017-04-06 DIAGNOSIS — E785 Hyperlipidemia, unspecified: Secondary | ICD-10-CM | POA: Diagnosis not present

## 2017-04-06 DIAGNOSIS — S52351D Displaced comminuted fracture of shaft of radius, right arm, subsequent encounter for closed fracture with routine healing: Secondary | ICD-10-CM

## 2017-04-06 DIAGNOSIS — Z9181 History of falling: Secondary | ICD-10-CM | POA: Diagnosis not present

## 2017-04-06 DIAGNOSIS — L89611 Pressure ulcer of right heel, stage 1: Secondary | ICD-10-CM

## 2017-04-06 DIAGNOSIS — Z794 Long term (current) use of insulin: Secondary | ICD-10-CM | POA: Diagnosis not present

## 2017-04-06 DIAGNOSIS — E1142 Type 2 diabetes mellitus with diabetic polyneuropathy: Secondary | ICD-10-CM | POA: Diagnosis not present

## 2017-04-06 DIAGNOSIS — Z87440 Personal history of urinary (tract) infections: Secondary | ICD-10-CM | POA: Diagnosis not present

## 2017-04-06 DIAGNOSIS — L89151 Pressure ulcer of sacral region, stage 1: Secondary | ICD-10-CM

## 2017-04-06 NOTE — Patient Outreach (Signed)
[facility]) Care Management  04/06/2017  [name] 1930/03/10 [id number]   CSW received call back today from patient's daughter, [name]. She reports that they were not given RX for Flexeril and Tramadol at time of dc from SNF and still have not been able to get clarificiation. CSW has contacted the SNF and requested follow up call and list of dc meds.   CSW will plan f/u call to patient and daughter tomorrow for update on this dilemma.   CSW has also made [facility] aware for assistance.    Eduard Clos, MSW, [place] Worker  [place] [phone]

## 2017-04-06 NOTE — Patient Outreach (Signed)
[facility] [facility]) Care Management  04/06/2017  DELITHA ELMS 06-14-1929 [id number]   CSW received call from patient's daughter, [name], indicating she has not been able to get clarification on the RX's since SNF release. "She does not have her tramadol or flexeril".  CSW had encouraged daughter to call SNF to inquire and she was told they were calling it in to Pharmacy.  CSW has left message for [facility] to request dc med list be provided. CSW has also asked [facility] to follow up as well.   Eduard Clos, MSW, [place] Worker  [place] [phone]

## 2017-04-06 NOTE — Patient Outreach (Signed)
Transition of care: Placed call to patients home. A lady answered and states that patient has moved in with her daughter [name].  Phone number   [phone].  Daughter reports that patient got dizzy and fell and broke her hip and wrist.   Placed call to daughter who reports that patient will be living with her full time.  Daughter reports patient has improved since discharge from nursing home.  States patient continues to have difficulty with ambulation and reports that patient is able to pivot only. Daughter carries patient to the bathroom.  Daughter reports home health nurse came today and PT will start tomorrow with [facility].    Daughter reports patient has a new RX for flexeril and home health nurse is getting a prescription for tramadol.   Daughter reports that patient does not have a follow up planned with primary MD because she does not feel like she can get in the car.   Daughter reports patient is doing much better since she is living with her daughter.    Current Outpatient Medications:  .  acetaminophen (TYLENOL) 500 MG tablet, Take 2 tabs po q8 hours x 14 days following surgery, Disp: 84 tablet, Rfl: 2 .  aspirin 81 MG tablet, Take 81 mg by mouth daily.  , Disp: , Rfl:  .  carvedilol (COREG) 6.25 MG tablet, Take 1 tablet (6.25 mg total) by mouth 2 (two) times daily with a meal., Disp: 60 tablet, Rfl: 0 .  cyclobenzaprine (FLEXERIL) 10 MG tablet, Take 10 mg by mouth 3 (three) times daily as needed., Disp: , Rfl:  .  furosemide (LASIX) 20 MG tablet, Take 1 tablet (20 mg total) by mouth daily., Disp: 30 tablet, Rfl: 0 .  losartan (COZAAR) 25 MG tablet, Take 1 tablet (25 mg total) by mouth daily., Disp: 30 tablet, Rfl: 0 .  omeprazole (PRILOSEC) 20 MG capsule, Take 20 mg by mouth 2 (two) times daily before a meal., Disp: , Rfl:  .  atorvastatin (LIPITOR) 40 MG tablet, Take 1 tablet (40 mg total) by mouth daily at 6 PM. (Patient not taking: Reported on 04/06/2017), Disp: 30 tablet, Rfl:  0 .  calcium carbonate (OS-CAL) 1250 (500 Ca) MG chewable tablet, Chew 1 tablet by mouth daily., Disp: , Rfl:  .  enoxaparin (LOVENOX) 40 MG/0.4ML injection, Inject 0.4 mLs (40 mg total) into the skin daily. (Patient not taking: Reported on 04/06/2017), Disp: 14 Syringe, Rfl: 0 .  feeding supplement, GLUCERNA SHAKE, (GLUCERNA SHAKE) LIQD, Take 237 mLs by mouth 2 (two) times daily between meals. (Patient not taking: Reported on 04/06/2017), Disp: 14220 mL, Rfl: 0 .  insulin aspart (NOVOLOG FLEXPEN) 100 UNIT/ML FlexPen, Inject 10 Units into the skin 3 (three) times daily with meals. (Patient not taking: Reported on 04/06/2017), Disp: 15 mL, Rfl: 5 .  insulin NPH Human (HUMULIN N,NOVOLIN N) 100 UNIT/ML injection, Inject 15 Units into the skin at bedtime. , Disp: , Rfl:  .  ondansetron (ZOFRAN) 4 MG tablet, Take 1 tablet (4 mg total) by mouth every 8 (eight) hours as needed for nausea or vomiting. (Patient not taking: Reported on 04/06/2017), Disp: 40 tablet, Rfl: 0 .  oxyCODONE (ROXICODONE) 5 MG immediate release tablet, Take 1-2 tabs po q6-8 hours prn pain.  Do not exceed 6 tabs per day (Patient not taking: Reported on 04/06/2017), Disp: 20 tablet, Rfl: 0 .  polyethylene glycol (MIRALAX / GLYCOLAX) packet, Take 17 g by mouth daily. (Patient not taking: Reported on 04/06/2017), Disp: 30  packet, Rfl: 0   PLAN:  Will continue weekly transition of care call. Offered home visit and daughter wants to call me back.  I provided my contact phone number and name. This note and barrier letter sent to MD.  [facility] CM Care Plan Problem One     Most Recent Value  Care Plan Problem One  Knowledge deficit related to recent fall and ablilty to becoming independent again in the next 60 days.   Role Documenting the Problem One  Care Management [place] for Problem One  Active  [facility] Long Term Goal   Patient will report ability to walk in the next 60 days.  [facility] Long Term Goal Start Date  04/06/17   Interventions for Problem One Long Term Goal  Reviewed transition of care program. Provided my contact information.  Offered home visit.    [facility] CM Short Term Goal #1   Patient and or daughter will report follow up planned with primary MD in the next 7 days.  [facility] CM Short Term Goal #1 Start Date  04/06/17  Interventions for Short Term Goal #1  Reviewed importance of follow up with MD. Encouraged daughter to make an appointment.  [facility] CM Short Term Goal #2   Patient will report starting physical therapy program within the next 7 days.  [facility] CM Short Term Goal #2 Start Date  04/06/17  Interventions for Short Term Goal #2  Reviewed importnace of home exercises daily as suggested at discharge.      Tomasa Rand, RN, BSN, CEN [facility] [employer] [phone]

## 2017-04-07 ENCOUNTER — Telehealth: Payer: Self-pay | Admitting: Family Medicine

## 2017-04-07 ENCOUNTER — Encounter: Payer: Self-pay | Admitting: *Deleted

## 2017-04-07 ENCOUNTER — Other Ambulatory Visit: Payer: Self-pay | Admitting: *Deleted

## 2017-04-07 DIAGNOSIS — S72001D Fracture of unspecified part of neck of right femur, subsequent encounter for closed fracture with routine healing: Secondary | ICD-10-CM

## 2017-04-07 MED ORDER — TRAMADOL HCL 50 MG PO TABS: 50.0000 mg | ORAL_TABLET | Freq: Four times a day (QID) | ORAL | 0 refills | Status: DC | PRN

## 2017-04-07 NOTE — Telephone Encounter (Signed)
[name] with [facility] needing verbal orders for PT 1x 1 week and 2x for 4 weeks. Effective today. [name]'s call back number is [phone] ok to leave VM.   Also needing a script for new chair cushion 16 x 18 comfort insert brand. Daughter will pick up script from office when ready

## 2017-04-07 NOTE — Telephone Encounter (Signed)
Verbal orders given to [name] at [facility] for PT 1 x a week for 1 week, then 2 x week for 4 weeks.  DME order placed for new chair cushion 16 x 18 comfort insert brand.  Spoke with Neoma Laming (daughter).  She would like [place] to mail order for chair pad.  Mailed as requested.  Neoma Laming also requested Rx for Tramadol for Ms. [name]  Tramadol called to [facility] on [address] as instructed by Dr. Lorelei Pont.

## 2017-04-07 NOTE — Telephone Encounter (Signed)
Can you please help do both of these?  Electronically Signed  By: Owens Loffler, MD On: 04/07/2017 3:42 PM

## 2017-04-07 NOTE — Patient Outreach (Signed)
[facility]) Care Management  [facility] Social Work  04/07/2017  [name] October 09, 1929 [id number]  Subjective:  "Physical therapy is coming to see her"  Objective:  [facility] CSW to assist patient and family with community based resources to aide in their well-being, quality of life and overall safety and needs.    Current Medications:  Current Outpatient Medications  Medication Sig Dispense Refill  . acetaminophen (TYLENOL) 500 MG tablet Take 2 tabs po q8 hours x 14 days following surgery 84 tablet 2  . aspirin 81 MG tablet Take 81 mg by mouth daily.      Marland Kitchen atorvastatin (LIPITOR) 40 MG tablet Take 1 tablet (40 mg total) by mouth daily at 6 PM. (Patient not taking: Reported on 04/06/2017) 30 tablet 0  . calcium carbonate (OS-CAL) 1250 (500 Ca) MG chewable tablet Chew 1 tablet by mouth daily.    . carvedilol (COREG) 6.25 MG tablet Take 1 tablet (6.25 mg total) by mouth 2 (two) times daily with a meal. 60 tablet 0  . cyclobenzaprine (FLEXERIL) 10 MG tablet Take 10 mg by mouth 3 (three) times daily as needed.    . enoxaparin (LOVENOX) 40 MG/0.4ML injection Inject 0.4 mLs (40 mg total) into the skin daily. (Patient not taking: Reported on 04/06/2017) 14 Syringe 0  . feeding supplement, GLUCERNA SHAKE, (GLUCERNA SHAKE) LIQD Take 237 mLs by mouth 2 (two) times daily between meals. (Patient not taking: Reported on 04/06/2017) 14220 mL 0  . furosemide (LASIX) 20 MG tablet Take 1 tablet (20 mg total) by mouth daily. 30 tablet 0  . insulin aspart (NOVOLOG FLEXPEN) 100 UNIT/ML FlexPen Inject 10 Units into the skin 3 (three) times daily with meals. (Patient not taking: Reported on 04/06/2017) 15 mL 5  . insulin NPH Human (HUMULIN N,NOVOLIN N) 100 UNIT/ML injection Inject 15 Units into the skin at bedtime.     Marland Kitchen losartan (COZAAR) 25 MG tablet Take 1 tablet (25 mg total) by mouth daily. 30 tablet 0  . omeprazole (PRILOSEC) 20 MG capsule Take 20 mg by mouth 2 (two) times daily before a  meal.    . ondansetron (ZOFRAN) 4 MG tablet Take 1 tablet (4 mg total) by mouth every 8 (eight) hours as needed for nausea or vomiting. (Patient not taking: Reported on 04/06/2017) 40 tablet 0  . oxyCODONE (ROXICODONE) 5 MG immediate release tablet Take 1-2 tabs po q6-8 hours prn pain.  Do not exceed 6 tabs per day (Patient not taking: Reported on 04/06/2017) 20 tablet 0  . polyethylene glycol (MIRALAX / GLYCOLAX) packet Take 17 g by mouth daily. (Patient not taking: Reported on 04/06/2017) 30 packet 0   No current facility-administered medications for this visit.     Functional Status:  In your present state of health, do you have any difficulty performing the following activities: 03/02/2017 03/01/2017  Hearing? N -  Vision? N -  Difficulty concentrating or making decisions? N -  Walking or climbing stairs? Y -  Dressing or bathing? Y -  Doing errands, shopping? Y Y  Preparing Food and eating ? - -  Using the Toilet? - -  In the past six months, have you accidently leaked urine? - -  Do you have problems with loss of bowel control? - -  Managing your Medications? - -  Managing your Finances? - -  Housekeeping or managing your Housekeeping? - -  Some recent data might be hidden    Fall/Depression Screening:  Fall Risk  06/23/2016  02/20/2015 05/08/2013  Falls in the past year? Yes Yes Yes  Comment pt reports tripping over bed; pt also fell and broke right arm - -  Number falls in past yr: 2 or more 1 1  Injury with Fall? Yes No (No Data)  Comment - - Broken right arm   PHQ 2/9 Scores 06/23/2016 02/20/2015 05/08/2013  PHQ - 2 Score 0 0 0    Assessment:   CSW spoke with patient an daughter who report she is doing well at home with daughter.  "Now that she is getting her meds and meals routinely she is much better"- per daughter, patient's meds and meals were not well managed at home with there son/brother pta.   [facility] is Agricultural consultant and PT visits- per daugher, the Nurse came  yesterday and helped to get the RX questions clarified.   Daughter denies any CSW needs at this time- daughter advised the diapers are not covered by [employer].  CSW will closed CSW consult as needs are met and daughter is very equipped and supportive of mother and her needs.  CSW will advise [facility] team and PCP of above plans.  Plan:  CSW consult closure.   Eduard Clos, MSW, [place] Worker  [place] [phone]

## 2017-04-07 NOTE — Telephone Encounter (Signed)
Copied from [place] [phone]. Topic: Quick Communication - See Telephone Encounter >> Apr 07, 2017 11:06 AM Ether Griffins B wrote: CRM for notification. See Telephone encounter for:  [name] with [facility] calling to start services with pt. They are wanting to start nursing 2x a week for 2 weeks to monitor medication and pain levels and diagnosis. Also will be doing PT and OT. [phone] (call back number) pt also wanting tramadol added to her medicine. She was on oxycodone in rehab (doesn't have any at home) and didn't tolerate it well and needs something milder for pain 04/07/17.

## 2017-04-10 ENCOUNTER — Telehealth: Payer: Self-pay | Admitting: Family Medicine

## 2017-04-10 ENCOUNTER — Other Ambulatory Visit: Payer: Self-pay

## 2017-04-10 NOTE — Telephone Encounter (Signed)
Copied from [place] [phone]. Topic: Quick Communication - See Telephone Encounter >> Apr 10, 2017 12:50 PM Cleaster Corin, NT wrote: CRM for notification. See Telephone encounter for:   04/10/17. Jessica with [facility] calling to start services with nursing They are wanting to start nursing 2x a week for 2 weeks to monitor medication and pain levels and diagnosis. Also stage 1 pressure ulcers on buttocks [phone]

## 2017-04-10 NOTE — Telephone Encounter (Signed)
Noted  

## 2017-04-10 NOTE — Patient Outreach (Signed)
Transition of care:  Placed call to patient and spoke with daughter [name].  Debbie reports that patient is about the same. Reports home health continues to be active. Reports patient is having foot pain that she is using cream for.  Reports patient has a follow up appointment with primary MD this week. Offered a home visit and patient and daughter are unsure of time and date that would work in their schedule.  Daughter states that she will call me back with a potential date and time. Provided my contact information.  PLAN: will continue weekly outreach attempts.  Tomasa Rand, RN, BSN, CEN [facility] [employer] [phone]

## 2017-04-12 NOTE — Telephone Encounter (Signed)
Left message for [name] with [facility] giving verbal orders for nursing 2x a week for 2 weeks to monitor medication and pain levels and diagnosis. Also to evaluate and treat stage 1 pressure ulcers on buttocks.

## 2017-04-12 NOTE — Telephone Encounter (Signed)
Copied from [place] [phone]. Topic: Quick Communication - See Telephone Encounter >> Apr 12, 2017 10:14 AM Patrice Paradise wrote: Janett Billow with [facility] called again to see about getting service started with nursing for the patient. They are wanting to start nursing 2x a week for 2 weeks to monitor medication and pain levels and diagnosis. Also stage 1 pressure ulcers on buttocks. Janett Billow stated patient has an appt with Dr. Diona Browner on Friday, but she ready needs to be seen before Friday by the nurse. Janett Billow number [phone]  CRM for notification. See Telephone encounter for: 04/12/17.

## 2017-04-13 ENCOUNTER — Telehealth: Payer: Self-pay | Admitting: Family Medicine

## 2017-04-13 NOTE — Telephone Encounter (Signed)
I spoke with [name] (DPR signed) and she wants to see if could get urine checked at appt on 04/14/17 to rule out UTI. FYI to Dr Diona Browner.

## 2017-04-13 NOTE — Telephone Encounter (Signed)
Copied from [name] Topic: Quick Communication - See Telephone Encounter >> Apr 13, 2017 10:20 AM Ether Griffins B wrote: CRM for notification. See Telephone encounter for:  Pt having increase back pain. Shes been taking lasic and daughter is wondering if she has a uti. Pt has an appt tmrw with Dr. Diona Browner at 9:30. Could she do a urine test then?  04/13/17.

## 2017-04-14 ENCOUNTER — Other Ambulatory Visit: Payer: Self-pay | Admitting: Family Medicine

## 2017-04-14 ENCOUNTER — Other Ambulatory Visit: Payer: Self-pay

## 2017-04-14 ENCOUNTER — Encounter: Payer: Self-pay | Admitting: Family Medicine

## 2017-04-14 ENCOUNTER — Ambulatory Visit (INDEPENDENT_AMBULATORY_CARE_PROVIDER_SITE_OTHER): Payer: PPO | Admitting: Family Medicine

## 2017-04-14 VITALS — BP 108/61 | HR 97 | Temp 98.4°F | Ht 63.5 in

## 2017-04-14 DIAGNOSIS — I214 Non-ST elevation (NSTEMI) myocardial infarction: Secondary | ICD-10-CM

## 2017-04-14 DIAGNOSIS — E785 Hyperlipidemia, unspecified: Secondary | ICD-10-CM | POA: Diagnosis not present

## 2017-04-14 DIAGNOSIS — E1159 Type 2 diabetes mellitus with other circulatory complications: Secondary | ICD-10-CM | POA: Diagnosis not present

## 2017-04-14 DIAGNOSIS — L89612 Pressure ulcer of right heel, stage 2: Secondary | ICD-10-CM

## 2017-04-14 DIAGNOSIS — I5021 Acute systolic (congestive) heart failure: Secondary | ICD-10-CM | POA: Diagnosis not present

## 2017-04-14 DIAGNOSIS — S72001A Fracture of unspecified part of neck of right femur, initial encounter for closed fracture: Secondary | ICD-10-CM | POA: Diagnosis not present

## 2017-04-14 DIAGNOSIS — I10 Essential (primary) hypertension: Secondary | ICD-10-CM

## 2017-04-14 DIAGNOSIS — E1349 Other specified diabetes mellitus with other diabetic neurological complication: Secondary | ICD-10-CM | POA: Diagnosis not present

## 2017-04-14 LAB — BASIC METABOLIC PANEL
BUN: 13 mg/dL (ref 6–23)
CHLORIDE: 100 meq/L (ref 96–112)
CO2: 28 mEq/L (ref 19–32)
Calcium: 9 mg/dL (ref 8.4–10.5)
Creatinine, Ser: 0.63 mg/dL (ref 0.40–1.20)
GFR: 95.42 mL/min (ref >60.00)
GLUCOSE: 98 mg/dL (ref 70–99)
POTASSIUM: 4.3 meq/L (ref 3.5–5.1)
SODIUM: 135 meq/L (ref 135–145)

## 2017-04-14 MED ORDER — INSULIN ASPART 100 UNIT/ML FLEXPEN: 10.0000 [IU] | PEN_INJECTOR | Freq: Three times a day (TID) | SUBCUTANEOUS | 5 refills | Status: DC

## 2017-04-14 MED ORDER — FUROSEMIDE 20 MG PO TABS: 20.0000 mg | ORAL_TABLET | Freq: Every day | ORAL | 0 refills | Status: DC

## 2017-04-14 MED ORDER — CARVEDILOL 6.25 MG PO TABS: 6.2500 mg | ORAL_TABLET | Freq: Two times a day (BID) | ORAL | 0 refills | Status: DC

## 2017-04-14 MED ORDER — ATORVASTATIN CALCIUM 40 MG PO TABS: 40.0000 mg | ORAL_TABLET | Freq: Every day | ORAL | 11 refills | Status: DC

## 2017-04-14 MED ORDER — LOSARTAN POTASSIUM 25 MG PO TABS: 25.0000 mg | ORAL_TABLET | Freq: Every day | ORAL | 0 refills | Status: DC

## 2017-04-14 NOTE — Telephone Encounter (Signed)
Copied from [place] [id number]. Topic: Quick Communication - See Telephone Encounter >> Apr 14, 2017 11:38 AM Cleaster Corin, NT wrote: CRM for notification. See Telephone encounter for:   04/14/17. Pt. Daughter [name] calling to ask questions about pt. Medications that needs refill but came from the rehab that mother was at. [name] can be reached at [phone]

## 2017-04-14 NOTE — Telephone Encounter (Signed)
Discussed.. No symptoms of UTI at appt. Just increased urination from furosemide.

## 2017-04-14 NOTE — Telephone Encounter (Signed)
error 

## 2017-04-14 NOTE — Progress Notes (Signed)
Subjective:    Patient ID: [name], female    DOB: 1930/02/04, 84 y.o.   MRN: [id number]  HPI   84 year old female presents with her daughter  For hospital follow up following an accidental fall  on 11/21and resulting acute comminuted intratrochanteric fracture of the proximal right femur andomminuted fracture of the distal right radius. No head trauma or loss of consciousness.  Patient underwent intertrochanteric fixation right hip open reduction fixation right wrist,November 21. Her postoperative was complicated by non-ST elevation myocardial infarction.Which was treated medically, due to gastrointestinal bleeding and symptomatic anemia.   Summary of  Complications of hospital stay  Copied bleow:  1. Non-ST elevation myocardial infarction. 4 days after surgical intervention, patient developed significant dyspnea, further workup revealed anterolateral ST depressions. Troponins were found to be elevated, peak at 9.0. Further workup with echocardiography showed a significant reduction on the LV systolic function down to 20-25%, with severe hypokinesis of the mid to apical-anterior septal, inferior and inferior septal myocardium. Cardiology was consulted with recommendations for aggressive medical therapy, patient tolerated well aspirin, atorvastatin, carvedilol and losartan.   2. Acute systolic heart failure decompensation. Patient had a significant decrease in her LV systolic function, developed cardiogenic pulmonary edema, she was placed on furosemide for diuresis achieving negative fluid balance of -13,709 by day of her discharge. Continue carvedilol, losartan and furosemide.   3. Acute blood loss anemia, due to GI bleed. Patient had significant drop on her hemoglobin, down to 7.5 by November 25. She tested positive for Hemoccult blood. Received 2 units of packed red blood cells, with improvement of her hemoglobin. Further workup with CT of the abdomen showed no intra-abdominal  bleed. Patient will follow-up as an outpatient with gastroenterology, for possible endoscopic procedure.   4. Acute kidney injury chronic kidney disease stage II. Patient received supportive medical therapy, including diuretics, kidney function improved, with a discharge creatinine of 0.65 and serum potassium 4.4. Patient will need close follow-up as an outpatient.  5. Type 2 diabetes mellitus. Patient was placed on insulin sliding scale for glucose coverage and monitoring, continue basal insulin therapy. Capillary glucose remained well-controlled.  6. Sacral and right heel stage I ulcer. Patient to receive local wound care   She was discharged from hospital to rehab facility.  Discharged home to daughters house  2 weeks ago.  Pt needs eval with BMET on  20 mg furosemide/ not on potassium.   She reports she is feeling better overall, gradually stronger.  PT  and OT coming out.  Sacral ulcer resolved.  Ulcer on right heel increasing in size.[facility] wound nurse coming to evaluate.  Daughter is applying desitin.   Blood sugars running good.  FBS 134  Working on healthy eating.  Occ post prandial after bread 226.  Hip, wrist and heel pain: Needs refill of tramadol. Not needing the oxycodone. Having trouble sleeping.   Review of Systems  Constitutional: Negative for fatigue and fever.  HENT: Negative for congestion.   Eyes: Negative for pain.  Respiratory: Negative for cough and shortness of breath.   Cardiovascular: Negative for chest pain, palpitations and leg swelling.  Gastrointestinal: Negative for abdominal pain.  Genitourinary: Negative for dysuria and vaginal bleeding.  Musculoskeletal: Negative for back pain.  Neurological: Negative for syncope, light-headedness and headaches.  Psychiatric/Behavioral: Negative for dysphoric mood.       Objective:   Physical Exam  Constitutional: Vital signs are normal. She appears well-developed and well-nourished. She is  cooperative.  Non-toxic appearance. She does not appear ill. No distress.  Elderly female in NAD  HENT:  Head: Normocephalic.  Right Ear: Hearing, tympanic membrane, external ear and ear canal normal. Tympanic membrane is not erythematous, not retracted and not bulging.  Left Ear: Hearing, tympanic membrane, external ear and ear canal normal. Tympanic membrane is not erythematous, not retracted and not bulging.  Nose: No mucosal edema or rhinorrhea. Right sinus exhibits no maxillary sinus tenderness and no frontal sinus tenderness. Left sinus exhibits no maxillary sinus tenderness and no frontal sinus tenderness.  Mouth/Throat: Uvula is midline, oropharynx is clear and moist and mucous membranes are normal.  Eyes: Conjunctivae, EOM and lids are normal. Pupils are equal, round, and reactive to light. Lids are everted and swept, no foreign bodies found.  Neck: Trachea normal and normal range of motion. Neck supple. Carotid bruit is not present. No thyroid mass and no thyromegaly present.  Cardiovascular: Normal rate, regular rhythm, S1 normal, S2 normal, normal heart sounds, intact distal pulses and normal pulses. Exam reveals no gallop and no friction rub.  No murmur heard. Pulmonary/Chest: Effort normal and breath sounds normal. No tachypnea. No respiratory distress. She has no decreased breath sounds. She has no wheezes. She has no rhonchi. She has no rales.  Abdominal: Soft. Normal appearance and bowel sounds are normal. There is no tenderness.  Neurological: She is alert.  Skin: Skin is warm, dry and intact. No rash noted.  Right heel ulcer, minimal erythema no discharge and no odor.  Psychiatric: Her speech is normal and behavior is normal. Judgment and thought content normal. Her mood appears not anxious. Cognition and memory are normal. She does not exhibit a depressed mood.          Assessment & Plan:

## 2017-04-14 NOTE — Telephone Encounter (Signed)
I spoke with [name] (DPR signed) pt was seen earlier today and [name] forgot about pt needing refills from a Dr Cathlean Sauer for [employer] and carvediolol. Pt does not have card f/u appt.Debbie request Dr Diona Browner to fill.[facility] [address]. Also request refill novolog flexpen; refilled per protocol.

## 2017-04-14 NOTE — Patient Outreach (Signed)
Transition of care: Voicemail received from daughter requesting a call back.  Returned call and spoke with [name] who reports wound on heel not healing. Reports saw primary MD today and wound care was ordered.  Daughter reports that she does not know who cardiologist is and request help finding out for follow up purposes.  Reviewed medical record while on phone with daughter and provided name of Dr. Burt Knack per last hospital admission. Provided contact phone number for Dr. Burt Knack.  Reviewed progress and struggles and daughter agreed to home visit on 04/19/2017 .  PLAN: home visit on 04/19/2017 Encouraged daughter to call me sooner for problems.  Tomasa Rand, RN, BSN, CEN [facility] [employer] [phone]

## 2017-04-14 NOTE — Patient Instructions (Addendum)
Stick to a low carb diet. Goal fasting < 120, 2 hours after eating < 180. Please stop at the lab to have labs drawn. Restart atorvastatin when able. Use tramadol for pain, can take at night.

## 2017-04-17 ENCOUNTER — Ambulatory Visit: Payer: Self-pay

## 2017-04-19 ENCOUNTER — Telehealth: Payer: Self-pay | Admitting: Family Medicine

## 2017-04-19 ENCOUNTER — Other Ambulatory Visit: Payer: Self-pay

## 2017-04-19 ENCOUNTER — Other Ambulatory Visit: Payer: Self-pay | Admitting: Pharmacist

## 2017-04-19 DIAGNOSIS — E1165 Type 2 diabetes mellitus with hyperglycemia: Principal | ICD-10-CM

## 2017-04-19 DIAGNOSIS — I739 Peripheral vascular disease, unspecified: Secondary | ICD-10-CM

## 2017-04-19 DIAGNOSIS — E1122 Type 2 diabetes mellitus with diabetic chronic kidney disease: Secondary | ICD-10-CM

## 2017-04-19 DIAGNOSIS — L89612 Pressure ulcer of right heel, stage 2: Secondary | ICD-10-CM

## 2017-04-19 DIAGNOSIS — IMO0002 Reserved for concepts with insufficient information to code with codable children: Secondary | ICD-10-CM

## 2017-04-19 DIAGNOSIS — E1349 Other specified diabetes mellitus with other diabetic neurological complication: Secondary | ICD-10-CM

## 2017-04-19 NOTE — Patient Outreach (Signed)
[facility]) Care Management  04/19/2017  UCHECHI DENISON Mar 07, 1930 [id number]   2pm:  Arrived for scheduled home visit.   Spoke with patient and daughter about [facility] services. Provided overview of transition of care program and case management program.  Patient and daughter report that they are overwhelmed by all the phone calls and home health.  Reports that patient has a nurse with [facility] and a PT with [facility]. Reports that patient has followed up with primary MD.  Reports daughter is caregiver.  Home has a ramp.   Daughter and patient report that they are not interested in any [facility] services at this time.   Request case closure.    PLAN: reviewed the difference in home health and case management services. Questions about copay.  I suggest patient and daughter contact home heath agency and [facility].  Daughter concerns with non healing wound on right heel.  Suggested patient and daughter talk to MD about option like wound care center since they dont feel like the home health nurse is helpful.   Will plan case closure as patient is not interested in services at this time. I provided my contact card and [facility] calendar with local resources.  Also provided new patient packet. Will inform MD of case closure. Will inform [facility] team members of case closure  Tomasa Rand, RN, BSN, [facility] [employer] [phone]

## 2017-04-19 NOTE — Telephone Encounter (Signed)
Copied from [place] [id number]. Topic: Quick Communication - See Telephone Encounter >> Apr 19, 2017  3:38 PM Boyd Kerbs wrote: CRM for notification. See Telephone encounter for:  [name], daughter called requesting One touch Ultra Blue test life scan strips . Saying [facility] on [address] says a prescription has to be called in and will not refill from a different [place], [place] [address] [place] [address] Phone: [phone] Fax: [phone]   04/19/17.

## 2017-04-19 NOTE — Telephone Encounter (Signed)
Pt's requesting refill of test strips to be sent to [facility] in [place] on [address].One touch Ultra Blue test life scan strips not seen on pt's current medication list.

## 2017-04-19 NOTE — Patient Outreach (Signed)
[facility]) Care Management  04/19/2017  [name] 02-Jun-1929 [id number]  [name] is referred to pharmacy for medication management. Per referral from [place], patient's daugher, [name], has questions regarding patient's medication regimen from SNF. Called and spoke with patient's daughter. HIPAA identifiers verified.  Debbie reports that now is not a good time to talk.   PLAN  Will call again this afternoon, as requested.  Harlow Asa, PharmD, [place] Management [phone]

## 2017-04-19 NOTE — Telephone Encounter (Signed)
Pt was seen 04/14/2017.Please advise.

## 2017-04-19 NOTE — Patient Outreach (Signed)
Call back as requested. Speak with patient's daughter, [name]. Debbie reports that she and the patient met with [facility]  RNCM Tomasa Rand in their home today. Reports that she does not need my services.  Place a coordination of care call to [facility]. Estill Bamberg lets me know that the patient/caregiver has declined [facility]. Will close pharmacy episode at this time.  Harlow Asa, PharmD, [place] Management [phone]

## 2017-04-19 NOTE — Telephone Encounter (Signed)
Copied from [place] [id number]. Topic: Referral - Request >> Apr 19, 2017  2:45 PM Darl Householder, RMA wrote: Reason for CRM: patient is requesting a referral to a wound specialist [place] wound center, please call patient

## 2017-04-20 ENCOUNTER — Other Ambulatory Visit: Payer: Self-pay | Admitting: Family Medicine

## 2017-04-20 DIAGNOSIS — M25551 Pain in right hip: Secondary | ICD-10-CM | POA: Diagnosis not present

## 2017-04-20 DIAGNOSIS — S52571D Other intraarticular fracture of lower end of right radius, subsequent encounter for closed fracture with routine healing: Secondary | ICD-10-CM | POA: Diagnosis not present

## 2017-04-20 MED ORDER — GLUCOSE BLOOD VI STRP: ORAL_STRIP | 6 refills | Status: DC

## 2017-04-20 NOTE — Telephone Encounter (Signed)
We discussed that home health nurse was going to come out and make an assessment and recs... If that not helping... Then referral to wound center.  Let me know if she does want a referral now.

## 2017-04-20 NOTE — Telephone Encounter (Signed)
Debbie called and would like to get a referral to the wound center ASAP please.

## 2017-04-20 NOTE — Telephone Encounter (Signed)
Referral sent 

## 2017-04-20 NOTE — Telephone Encounter (Signed)
Left message for [name] to call back and confirm if they are really wanting referral to wound center at [facility] because at last visit it was discussed that home health was going to come out and access.  Ok to [facility] to get this information when [place] calls back.

## 2017-04-20 NOTE — Telephone Encounter (Signed)
Copied from [place] [id number]. Topic: Quick Communication - Rx Refill/Question >> Apr 20, 2017  8:48 AM Antoni[place] Iba C wrote: Pt's daughter called in a refill for 3 medications.   1. Lasix 2. Coreg 3. Cozaar   **daughter would like to go ahead and have refill due to weather. Daughter also said that pt is down to 2 pills on her Coreg.   Please advise.  Preferred Pharmacy (with phone number or street name): [place], [address] [place]   Agent: Please be advised that RX refills may take up to 3 business days. We ask that you follow-up with your pharmacy.

## 2017-04-20 NOTE — Telephone Encounter (Signed)
Test strips were on hx med list refilled as requested.[name] notified and voiced understanding.

## 2017-04-20 NOTE — Telephone Encounter (Signed)
I spoke wth [place] (DPR signed) and she will contact [facility] [address]. To get refills.

## 2017-04-28 ENCOUNTER — Telehealth: Payer: Self-pay | Admitting: Family Medicine

## 2017-04-28 NOTE — Telephone Encounter (Signed)
Left message for [name] with verbal order to have PT 2x week for 4 weeks.

## 2017-04-28 NOTE — Telephone Encounter (Unsigned)
Copied from [place] [id number]. Topic: Quick Communication - See Telephone Encounter >> Apr 28, 2017  2:18 PM Hewitt Shorts wrote: CRM for notification. See Telephone encounter for:  Sabra from home health Is calling for verbal order to have PT 2x week for 4 weeks   Best number [phone] 04/28/17.

## 2017-05-03 ENCOUNTER — Ambulatory Visit: Payer: PPO | Admitting: Internal Medicine

## 2017-05-05 DIAGNOSIS — I252 Old myocardial infarction: Secondary | ICD-10-CM | POA: Insufficient documentation

## 2017-05-05 NOTE — Assessment & Plan Note (Signed)
Restart statin  

## 2017-05-05 NOTE — Assessment & Plan Note (Signed)
Home healht eval pending.. If not improving will refer to wound care center given history of DM.

## 2017-05-05 NOTE — Assessment & Plan Note (Signed)
Well controlled. Need to continue to follow. Need good control for wound healing.

## 2017-05-05 NOTE — Addendum Note (Signed)
Addended by: Eliezer Lofts E on: 05/05/2017 05:57 PM   Modules accepted: Level of Service

## 2017-05-05 NOTE — Assessment & Plan Note (Signed)
Troponins were found to be elevated, peak at 9.0. Further workup with echocardiography showed a significant reduction on the LV systolic function down to 20-25%, with severe hypokinesis of the mid to apical-anterior septal, inferior and inferior septal myocardium. Cardiology was consulted with recommendations for aggressive medical therapy, patient tolerated well aspirin, atorvastatin, carvedilol and losartan.    Has follow up with cardiology upcoming.

## 2017-05-05 NOTE — Assessment & Plan Note (Signed)
Stable colume status on furosemide. Due for labs re-eval of potassium

## 2017-05-05 NOTE — Assessment & Plan Note (Signed)
Well controlled. Continue current medication.  

## 2017-05-05 NOTE — Assessment & Plan Note (Addendum)
In  Home health PT rehab.. Progressing slowly. Refill of tramadol for pain.

## 2017-05-09 ENCOUNTER — Encounter: Payer: PPO | Attending: Nurse Practitioner | Admitting: Nurse Practitioner

## 2017-05-09 DIAGNOSIS — N182 Chronic kidney disease, stage 2 (mild): Secondary | ICD-10-CM | POA: Insufficient documentation

## 2017-05-09 DIAGNOSIS — R54 Age-related physical debility: Secondary | ICD-10-CM | POA: Diagnosis not present

## 2017-05-09 DIAGNOSIS — I21A9 Other myocardial infarction type: Secondary | ICD-10-CM | POA: Insufficient documentation

## 2017-05-09 DIAGNOSIS — E1122 Type 2 diabetes mellitus with diabetic chronic kidney disease: Secondary | ICD-10-CM | POA: Insufficient documentation

## 2017-05-09 DIAGNOSIS — Z87891 Personal history of nicotine dependence: Secondary | ICD-10-CM | POA: Insufficient documentation

## 2017-05-09 DIAGNOSIS — M1990 Unspecified osteoarthritis, unspecified site: Secondary | ICD-10-CM | POA: Diagnosis not present

## 2017-05-09 DIAGNOSIS — Z794 Long term (current) use of insulin: Secondary | ICD-10-CM | POA: Insufficient documentation

## 2017-05-09 DIAGNOSIS — Z880 Allergy status to penicillin: Secondary | ICD-10-CM | POA: Insufficient documentation

## 2017-05-09 DIAGNOSIS — D649 Anemia, unspecified: Secondary | ICD-10-CM | POA: Diagnosis not present

## 2017-05-09 DIAGNOSIS — I509 Heart failure, unspecified: Secondary | ICD-10-CM | POA: Diagnosis not present

## 2017-05-09 DIAGNOSIS — I70234 Atherosclerosis of native arteries of right leg with ulceration of heel and midfoot: Secondary | ICD-10-CM | POA: Diagnosis not present

## 2017-05-09 DIAGNOSIS — L97519 Non-pressure chronic ulcer of other part of right foot with unspecified severity: Secondary | ICD-10-CM | POA: Diagnosis not present

## 2017-05-09 DIAGNOSIS — Z885 Allergy status to narcotic agent status: Secondary | ICD-10-CM | POA: Insufficient documentation

## 2017-05-09 DIAGNOSIS — E1140 Type 2 diabetes mellitus with diabetic neuropathy, unspecified: Secondary | ICD-10-CM | POA: Insufficient documentation

## 2017-05-09 DIAGNOSIS — L89610 Pressure ulcer of right heel, unstageable: Secondary | ICD-10-CM | POA: Insufficient documentation

## 2017-05-09 NOTE — Progress Notes (Addendum)
[name], [name] ([id number]) Visit Report for 05/09/2017 Allergy List Details Patient Name: [name], [name] Date of Service: 05/09/2017 10:30 AM Medical Record Number: [id number] Patient Account Number: [id number] Date of Birth/Sex: 10-17-1929 (84 y.o. Female) Treating RN: Ahmed Prima Primary Care Hawkin Charo: Eliezer Lofts Other Clinician: Referring Aayana Reinertsen: Eliezer Lofts Treating Charlina Dwight/Extender: Lawanda Cousins Weeks in Treatment: 0 Allergies Active Allergies codeine metformin penicillin pioglitazone fenofibrate Allergy Notes Electronic Signature(s) Signed: 05/09/2017 10:08:34 AM By: Alric Quan Entered By: Alric Quan on 05/09/2017 10:08:34 Skerritt, Dustyn E. ([id number]) -------------------------------------------------------------------------------- Arrival Information Details Patient Name: [name], [name] Date of Service: 05/09/2017 10:30 AM Medical Record Number: [id number] Patient Account Number: [id number] Date of Birth/Sex: 1929/05/01 (84 y.o. Female) Treating RN: Carolyne Fiscal, Debi Primary Care Adaijah Endres: Eliezer Lofts Other Clinician: Referring Kraig Genis: Eliezer Lofts Treating Emmalie Haigh/Extender: Cathie Olden in Treatment: 0 Visit Information Patient Arrived: Wheel Chair Arrival Time: 10:22 Accompanied By: daughter Transfer Assistance: EasyPivot Patient Lift Patient Identification Verified: Yes Secondary Verification Process Yes Completed: Patient Requires Transmission-Based No Precautions: Patient Has Alerts: Yes Patient Alerts: DM II unable to get ABI inaudib Electronic Signature(s) Signed: 05/09/2017 4:26:07 PM By: Alric Quan Entered By: Alric Quan on 05/09/2017 10:54:20 [name], [name] ([id number]) -------------------------------------------------------------------------------- Clinic Level of Care Assessment Details Patient Name: [name] Randy, [name] Date of Service: 05/09/2017 10:30 AM Medical Record Number: [id number] Patient  Account Number: [id number] Date of Birth/Sex: 1930-01-26 (84 y.o. Female) Treating RN: Carolyne Fiscal, Debi Primary Care Laureen Frederic: Eliezer Lofts Other Clinician: Referring Chele Cornell: Eliezer Lofts Treating Mykael Batz/Extender: Cathie Olden in Treatment: 0 Clinic Level of Care Assessment Items TOOL 1 Quantity Score X - Use when EandM and Procedure is performed on INITIAL visit 1 0 ASSESSMENTS - Nursing Assessment / Reassessment X - General Physical Exam (combine w/ comprehensive assessment (listed just below) when 1 20 performed on new pt. evals) X- 1 25 Comprehensive Assessment (HX, ROS, Risk Assessments, Wounds Hx, etc.) ASSESSMENTS - Wound and Skin Assessment / Reassessment []  - Dermatologic / Skin Assessment (not related to wound area) 0 ASSESSMENTS - Ostomy and/or Continence Assessment and Care []  - Incontinence Assessment and Management 0 []  - 0 Ostomy Care Assessment and Management (repouching, etc.) PROCESS - Coordination of Care X - Simple Patient / Family Education for ongoing care 1 15 []  - 0 Complex (extensive) Patient / Family Education for ongoing care []  - 0 Staff obtains Programmer, systems, Records, Test Results / Process Orders []  - 0 Staff telephones HHA, Nursing Homes / Clarify orders / etc []  - 0 Routine Transfer to another Facility (non-emergent condition) []  - 0 Routine Hospital Admission (non-emergent condition) X- 1 15 New Admissions / Biomedical engineer / Ordering NPWT, Apligraf, etc. []  - 0 Emergency Hospital Admission (emergent condition) PROCESS - Special Needs []  - Pediatric / Minor Patient Management 0 []  - 0 Isolation Patient Management []  - 0 Hearing / Language / Visual special needs []  - 0 Assessment of Community assistance (transportation, D/C planning, etc.) []  - 0 Additional assistance / Altered mentation []  - 0 Support Surface(s) Assessment (bed, cushion, seat, etc.) Fick, Dhrithi E. ([id number]) INTERVENTIONS - Miscellaneous []  -  External ear exam 0 []  - 0 Patient Transfer (multiple staff / Civil Service fast streamer / Similar devices) []  - 0 Simple Staple / Suture removal (25 or less) []  - 0 Complex Staple / Suture removal (26 or more) []  - 0 Hypo/Hyperglycemic Management (do not check if billed separately) X- 1 15 Ankle / Brachial Index (ABI) - do not check if billed separately Has the patient been  seen at the hospital within the last three years: Yes Total Score: 90 Level Of Care: New/Established - Level 3 Electronic Signature(s) Signed: 05/09/2017 4:56:18 PM By: Alric Quan Entered By: Alric Quan on 05/09/2017 16:29:04 [place], [place]. ([id number]) -------------------------------------------------------------------------------- Encounter Discharge Information Details Patient Name: [name], [name] Date of Service: 05/09/2017 10:30 AM Medical Record Number: [id number] Patient Account Number: [id number] Date of Birth/Sex: [birth year] (84 y.o. Female) Treating RN: Carolyne Fiscal, Debi Primary Care Zarra Geffert: Eliezer Lofts Other Clinician: Referring Cheryll Keisler: Eliezer Lofts Treating Deanie Jupiter/Extender: Cathie Olden in Treatment: 0 Encounter Discharge Information Items Discharge Pain Level: 0 Discharge Condition: Stable Ambulatory Status: Wheelchair Discharge Destination: Home Transportation: Private Auto Accompanied By: daughter Schedule Follow-up Appointment: Yes Medication Reconciliation completed and No provided to Patient/Care Oziah Vitanza: Provided on Clinical Summary of Care: 05/09/2017 Form Type Recipient Paper Patient EG Electronic Signature(s) Signed: 05/12/2017 4:27:15 PM By: Ruthine Dose Entered By: Ruthine Dose on 05/09/2017 11:48:26 [place], [place]. ([id number]) -------------------------------------------------------------------------------- Lower Extremity Assessment Details Patient Name: [name] Randy, [name] Date of Service: 05/09/2017 10:30 AM Medical Record Number: [id number] Patient Account  Number: [id number] Date of Birth/Sex: [birth year] (84 y.o. Female) Treating RN: Carolyne Fiscal, Debi Primary Care Versie Soave: Eliezer Lofts Other Clinician: Referring Tierra Divelbiss: Eliezer Lofts Treating Aryahi Denzler/Extender: Cathie Olden in Treatment: 0 Edema Assessment Assessed: [Left: No] [Right: No] Edema: [Left: N] [Right: o] Vascular Assessment Pulses: Dorsalis Pedis Palpable: [Right:No] Doppler Audible: [Right:Yes] Posterior Tibial Palpable: [Right:No] Doppler Audible: [Right:Inaudible] Extremity colors, hair growth, and conditions: Extremity Color: [Right:Normal] Hair Growth on Extremity: [Right:Yes] Temperature of Extremity: [Right:Cool] Capillary Refill: [Right:> 3 seconds] Toe Nail Assessment Left: Right: Thick: Yes Discolored: Yes Deformed: No Improper Length and Hygiene: Yes Notes unable to get ABI inaudible in the posterior tibial Electronic Signature(s) Signed: 05/09/2017 4:26:07 PM By: Alric Quan Entered By: Alric Quan on 05/09/2017 10:54:03 Dettman, Kimblery E. ([id number]) -------------------------------------------------------------------------------- Multi Wound Chart Details Patient Name: [name] Randy, Jilliana E. Date of Service: 05/09/2017 10:30 AM Medical Record Number: [id number] Patient Account Number: [id number] Date of Birth/Sex: [birth year] (84 y.o. Female) Treating RN: Carolyne Fiscal, Debi Primary Care Yarelly Kuba: Eliezer Lofts Other Clinician: Referring Tom Ragsdale: Eliezer Lofts Treating Carol Loftin/Extender: Cathie Olden in Treatment: 0 Vital Signs Height(in): 60 Pulse(bpm): 42 Weight(lbs): Blood Pressure(mmHg): 126/64 Body Mass Index(BMI): Temperature(F): 97.7 Respiratory Rate 16 (breaths/min): Photos: [1:No Photos] [N/A:N/A] Wound Location: [1:Right Calcaneus] [N/A:N/A] Wounding Event: [1:Pressure Injury] [N/A:N/A] Primary Etiology: [1:Pressure Ulcer] [N/A:N/A] Comorbid History: [1:Cataracts, Anemia, Congestive Heart Failure, Myocardial  Infarction, Type II Diabetes, Osteoarthritis, Neuropathy] [N/A:N/A] Date Acquired: [1:03/11/2017] [N/A:N/A] Weeks of Treatment: [1:0] [N/A:N/A] Wound Status: [1:Open] [N/A:N/A] Measurements L x W x D [1:1.3x3x0.3] [N/A:N/A] (cm) Area (cm) : [1:3.063] [N/A:N/A] Volume (cm) : [1:0.919] [N/A:N/A] % Reduction in Area: [1:0.00%] [N/A:N/A] % Reduction in Volume: [1:0.00%] [N/A:N/A] Classification: [1:Category/Stage III] [N/A:N/A] Exudate Amount: [1:Medium] [N/A:N/A] Exudate Type: [1:Serosanguineous] [N/A:N/A] Exudate Color: [1:red, brown] [N/A:N/A] Wound Margin: [1:Distinct, outline attached] [N/A:N/A] Granulation Amount: [1:Small (1-33%)] [N/A:N/A] Necrotic Amount: [1:Large (67-100%)] [N/A:N/A] Exposed Structures: [1:Fascia: No Fat Layer (Subcutaneous Tissue) Exposed: No Tendon: No Muscle: No Joint: No Bone: No] [N/A:N/A] Epithelialization: [1:None] [N/A:N/A] Debridement: [1:Debridement ([id number])] [N/A:N/A] Pre-procedure [1:11:29] [N/A:N/A] Verification/Time Out Taken: Pain Control: [1:Lidocaine 4% Topical Solution] [N/A:N/A] Tissue Debrided: Fibrin/Slough, Exudates, N/A N/A Subcutaneous Level: Skin/Subcutaneous Tissue N/A N/A Debridement Area (sq cm): 3.9 N/A N/A Instrument: Blade, Forceps N/A N/A Bleeding: Minimum N/A N/A Hemostasis Achieved: Pressure N/A N/A Procedural Pain: 0 N/A N/A Post Procedural Pain: 0 N/A N/A Debridement Treatment Procedure was tolerated well N/A N/A Response: Post Debridement 1.3x3x0.4 N/A N/A Measurements L  x W x D (cm) Post Debridement Volume: 1.225 N/A N/A (cm) Post Debridement Stage: Unstageable/Unclassified N/A N/A Periwound Skin Texture: No Abnormalities Noted N/A N/A Periwound Skin Moisture: No Abnormalities Noted N/A N/A Periwound Skin Color: No Abnormalities Noted N/A N/A Temperature: No Abnormality N/A N/A Tenderness on Palpation: Yes N/A N/A Wound Preparation: Ulcer Cleansing: N/A N/A Rinsed/Irrigated with Saline Topical  Anesthetic Applied: Other: lidocaine 4% Procedures Performed: Debridement N/A N/A Treatment Notes Wound #1 (Right Calcaneus) 1. Cleansed with: Clean wound with Normal Saline 2. Anesthetic Topical Lidocaine 4% cream to wound bed prior to debridement Hurricaine Topical Anesthetic Spray 4. Dressing Applied: Santyl Ointment 5. Secondary Dressing Applied Dry Gauze Foam Kerlix/Conform 7. Secured with Tape Notes allevyn heel cups Electronic Signature(s) Signed: 05/09/2017 4:14:39 PM By: Lawanda Cousins Entered By: Lawanda Cousins on 05/09/2017 15:22:54 [place], [place] ([id number]) -------------------------------------------------------------------------------- Lena Details Patient Name: LAVERN, CRIMI E. Date of Service: 05/09/2017 10:30 AM Medical Record Number: [id number] Patient Account Number: [id number] Date of Birth/Sex: 1929-07-02 (84 y.o. Female) Treating RN: Carolyne Fiscal, Debi Primary Care Navraj Dreibelbis: Eliezer Lofts Other Clinician: Referring Statia Burdick: Eliezer Lofts Treating Amear Strojny/Extender: Cathie Olden in Treatment: 0 Active Inactive ` Abuse / Safety / Falls / Self Care Management Nursing Diagnoses: History of Falls Potential for falls Goals: Patient will not experience any injury related to falls Date Initiated: 05/09/2017 Target Resolution Date: 08/19/2017 Goal Status: Active Interventions: Assess fall risk on admission and as needed Assess: immobility, friction, shearing, incontinence upon admission and as needed Assess impairment of mobility on admission and as needed per policy Assess personal safety and home safety (as indicated) on admission and as needed Assess self care needs on admission and as needed Provide education on basic hygiene Provide education on fall prevention Notes: ` Nutrition Nursing Diagnoses: Imbalanced nutrition Impaired glucose control: actual or potential Potential for alteratiion in Nutrition/Potential for  imbalanced nutrition Goals: Patient/caregiver agrees to and verbalizes understanding of need to use nutritional supplements and/or vitamins as prescribed Date Initiated: 05/09/2017 Target Resolution Date: 08/19/2017 Goal Status: Active Patient/caregiver will maintain therapeutic glucose control Date Initiated: 05/09/2017 Target Resolution Date: 08/19/2017 Goal Status: Active Interventions: Assess patient nutrition upon admission and as needed per policy Provide education on elevated blood sugars and impact on wound healing Battey, Claudia E. ([id number]) Provide education on nutrition Notes: ` Orientation to the Wound Care Program Nursing Diagnoses: Knowledge deficit related to the wound healing center program Goals: Patient/caregiver will verbalize understanding of the [place] Date Initiated: 05/09/2017 Target Resolution Date: 05/20/2017 Goal Status: Active Interventions: Provide education on orientation to the wound center Notes: ` Pain, Acute or Chronic Nursing Diagnoses: Pain, acute or chronic: actual or potential Potential alteration in comfort, pain Goals: Patient/caregiver will verbalize adequate pain control between visits Date Initiated: 05/09/2017 Target Resolution Date: 08/19/2017 Goal Status: Active Interventions: Complete pain assessment as per visit requirements Encourage patient to take pain medications as prescribed Implement pain control techniques (non-pharmaceutical) Notes: ` Pressure Nursing Diagnoses: Knowledge deficit related to causes and risk factors for pressure ulcer development Knowledge deficit related to management of pressures ulcers Potential for impaired tissue integrity related to pressure, friction, moisture, and shear Goals: Patient will remain free from development of additional pressure ulcers Date Initiated: 05/09/2017 Target Resolution Date: 08/19/2017 Goal Status: Active Interventions: Assess: immobility, friction,  shearing, incontinence upon admission and as needed Inga, Sharyon E. ([id number]) Assess offloading mechanisms upon admission and as needed Assess potential for pressure ulcer upon admission and as needed Provide education on  pressure ulcers Notes: ` Wound/Skin Impairment Nursing Diagnoses: Impaired tissue integrity Knowledge deficit related to ulceration/compromised skin integrity Goals: Ulcer/skin breakdown will have a volume reduction of 80% by week 12 Date Initiated: 05/09/2017 Target Resolution Date: 08/19/2017 Goal Status: Active Interventions: Assess patient/caregiver ability to perform ulcer/skin care regimen upon admission and as needed Assess ulceration(s) every visit Notes: Electronic Signature(s) Signed: 05/09/2017 4:26:07 PM By: Alric Quan Entered By: Alric Quan on 05/09/2017 11:27:37 [place], Catrinia E. ([id number]) -------------------------------------------------------------------------------- Pain Assessment Details Patient Name: [name] Randy, Shelia E. Date of Service: 05/09/2017 10:30 AM Medical Record Number: [id number] Patient Account Number: [id number] Date of Birth/Sex: 13-Mar-1930 (84 y.o. Female) Treating RN: Carolyne Fiscal, Debi Primary Care Rubee Vega: Eliezer Lofts Other Clinician: Referring Kynzlee Hucker: Eliezer Lofts Treating Sharifa Bucholz/Extender: Cathie Olden in Treatment: 0 Active Problems Location of Pain Severity and Description of Pain Patient Has Paino Yes Site Locations Pain Location: Pain in Ulcers Rate the pain. Current Pain Level: 8 Character of Pain Describe the Pain: Aching, Tender, Throbbing Pain Management and Medication Current Pain Management: Electronic Signature(s) Signed: 05/09/2017 4:26:07 PM By: Alric Quan Entered By: Alric Quan on 05/09/2017 10:25:55 [place], Valery E. ([id number]) -------------------------------------------------------------------------------- Patient/Caregiver Education Details Patient Name: TYNASIA, MCCAUL E. Date of Service: 05/09/2017 10:30 AM Medical Record Number: [id number] Patient Account Number: [id number] Date of Birth/Gender: 09/28/1929 (84 y.o. Female) Treating RN: Ahmed Prima Primary Care Physician: Eliezer Lofts Other Clinician: Referring Physician: Eliezer Lofts Treating Physician/Extender: Cathie Olden in Treatment: 0 Education Assessment Education Provided To: Patient and Caregiver daughter Education Topics Provided Basic Hygiene: Methods: Explain/Verbal Responses: State content correctly Elevated Blood Sugar/ Impact on Healing: Handouts: Elevated Blood Sugars: How Do They Affect Wound Healing Methods: Explain/Verbal, Printed Responses: State content correctly Nutrition: Handouts: Elevated Blood Sugars: How Do They Affect Wound Healing, Nutrition Methods: Explain/Verbal, Printed Responses: State content correctly Pressure: Handouts: Pressure Ulcers: Care and Offloading Methods: Explain/Verbal Responses: State content correctly Safety: Handouts: Medication Safety Methods: Explain/Verbal Responses: State content correctly Welcome To The [place]: Handouts: Welcome To The [place] Methods: Explain/Verbal Responses: State content correctly Wound/Skin Impairment: Handouts: Caring for Your Ulcer, Other: change dressing as ordered Methods: Demonstration, Explain/Verbal Responses: State content correctly Electronic Signature(s) Signed: 05/09/2017 4:26:07 PM By: Neil Crouch, [place] ([id number]) Entered By: Alric Quan on 05/09/2017 11:29:28 [place], [place] ([id number]) -------------------------------------------------------------------------------- Wound Assessment Details Patient Name: [name] Randy, Marit E. Date of Service: 05/09/2017 10:30 AM Medical Record Number: [id number] Patient Account Number: [id number] Date of Birth/Sex: 11-Mar-1930 (84 y.o. Female) Treating RN: Carolyne Fiscal, Debi Primary Care Jaaziel Peatross: Eliezer Lofts Other Clinician: Referring Meghen Akopyan: Eliezer Lofts Treating Marcquis Ridlon/Extender: Cathie Olden in Treatment: 0 Wound Status Wound Number: 1 Primary Pressure Ulcer Etiology: Wound Location: Right Calcaneus Wound Open Wounding Event: Pressure Injury Status: Date Acquired: 03/11/2017 Comorbid Cataracts, Anemia, Congestive Heart Failure, Weeks Of Treatment: 0 History: Myocardial Infarction, Type II Diabetes, Clustered Wound: No Osteoarthritis, Neuropathy Photos Photo Uploaded By: Alric Quan on 05/09/2017 16:48:42 Wound Measurements Length: (cm) 1.3 Width: (cm) 3 Depth: (cm) 0.3 Area: (cm) 3.063 Volume: (cm) 0.919 % Reduction in Area: 0% % Reduction in Volume: 0% Epithelialization: None Tunneling: No Undermining: No Wound Description Classification: Category/Stage III Wound Margin: Distinct, outline attached Exudate Amount: Medium Exudate Type: Serosanguineous Exudate Color: red, brown Slough/Fibrino Yes Wound Bed Granulation Amount: Small (1-33%) Exposed Structure Necrotic Amount: Large (67-100%) Fascia Exposed: No Necrotic Quality: Adherent Slough Fat Layer (Subcutaneous Tissue) Exposed: No Tendon Exposed: No Muscle Exposed: No Joint Exposed: No Bone Exposed: No Periwound Skin Texture  LORENDA, GRECCO E. ([id number]) Texture Color No Abnormalities Noted: No No Abnormalities Noted: No Moisture Temperature / Pain No Abnormalities Noted: No Temperature: No Abnormality Tenderness on Palpation: Yes Wound Preparation Ulcer Cleansing: Rinsed/Irrigated with Saline Topical Anesthetic Applied: Other: lidocaine 4%, Treatment Notes Wound #1 (Right Calcaneus) 1. Cleansed with: Clean wound with Normal Saline 2. Anesthetic Topical Lidocaine 4% cream to wound bed prior to debridement Hurricaine Topical Anesthetic Spray 4. Dressing Applied: Santyl Ointment 5. Secondary Dressing Applied Dry Gauze Foam Kerlix/Conform 7. Secured with Tape Notes allevyn  heel cups Electronic Signature(s) Signed: 05/09/2017 4:26:07 PM By: Alric Quan Entered By: Alric Quan on 05/09/2017 11:32:37 [place], [place] E. ([id number]) -------------------------------------------------------------------------------- Vitals Details Patient Name: HAFF, Tequlia E. Date of Service: 05/09/2017 10:30 AM Medical Record Number: [id number] Patient Account Number: [id number] Date of Birth/Sex: 16-Feb-1930 (84 y.o. Female) Treating RN: Carolyne Fiscal, Debi Primary Care Paislee Szatkowski: Eliezer Lofts Other Clinician: Referring Truly Stankiewicz: Eliezer Lofts Treating Quetzali Heinle/Extender: Cathie Olden in Treatment: 0 Vital Signs Time Taken: 10:25 Temperature (F): 97.7 Height (in): 63 Pulse (bpm): 89 Source: Stated Respiratory Rate (breaths/min): 16 Blood Pressure (mmHg): 126/64 Reference Range: 80 - 120 mg / dl Electronic Signature(s) Signed: 05/09/2017 4:26:07 PM By: Alric Quan Entered By: Alric Quan on 05/09/2017 10:29:01

## 2017-05-10 NOTE — Progress Notes (Signed)
LARRY, KNIPP ([id number]) Visit Report for 05/09/2017 Abuse/Suicide Risk Screen Details Patient Name: [name], [name] Date of Service: 05/09/2017 10:30 AM Medical Record Number: [id number] Patient Account Number: [id number] Date of Birth/Sex: May 09, 1929 (84 y.o. Female) Treating RN: Ahmed Prima Primary Care Cyndra Feinberg: Eliezer Lofts Other Clinician: Referring Joncarlo Friberg: Eliezer Lofts Treating Cindra Austad/Extender: Cathie Olden in Treatment: 0 Abuse/Suicide Risk Screen Items Answer ABUSE/SUICIDE RISK SCREEN: Has anyone close to you tried to hurt or harm you recentlyo No Do you feel uncomfortable with anyone in your familyo No Has anyone forced you do things that you didnot want to doo No Do you have any thoughts of harming yourselfo No Patient displays signs or symptoms of abuse and/or neglect. No Electronic Signature(s) Signed: 05/09/2017 4:26:07 PM By: Alric Quan Entered By: Alric Quan on 05/09/2017 10:35:01 Kimbler, Chasitty EMarland Kitchen ([id number]) -------------------------------------------------------------------------------- Activities of Daily Living Details Patient Name: [name], [name] E. Date of Service: 05/09/2017 10:30 AM Medical Record Number: [id number] Patient Account Number: [id number] Date of Birth/Sex: 19-May-1929 (84 y.o. Female) Treating RN: Ahmed Prima Primary Care Jailin Manocchio: Eliezer Lofts Other Clinician: Referring Torianna Junio: Eliezer Lofts Treating Punam Broussard/Extender: Cathie Olden in Treatment: 0 Activities of Daily Living Items Answer Activities of Daily Living (Please select one for each item) Drive Automobile Not Able Take Medications Need Assistance Use Telephone Completely Able Care for Appearance Completely Able Use Toilet Completely Able Bath / Shower Need Assistance Dress Self Completely Able Feed Self Completely Able Walk Need Assistance Get In / Out Bed Need Assistance Housework Not Able Prepare Meals Not Able Handle Money Need  Assistance Shop for Self Not Able Electronic Signature(s) Signed: 05/09/2017 4:26:07 PM By: Alric Quan Entered By: Alric Quan on 05/09/2017 10:35:53 Quintanar, Ahmari E. ([id number]) -------------------------------------------------------------------------------- Education Assessment Details Patient Name: [name], [name] E. Date of Service: 05/09/2017 10:30 AM Medical Record Number: [id number] Patient Account Number: [id number] Date of Birth/Sex: 1930-02-16 (84 y.o. Female) Treating RN: Carolyne Fiscal, Debi Primary Care Lindzie Boxx: Eliezer Lofts Other Clinician: Referring Jaeceon Michelin: Eliezer Lofts Treating Asaad Gulley/Extender: Cathie Olden in Treatment: 0 Primary Learner Assessed: Patient Learning Preferences/Education Level/Primary Language Learning Preference: Explanation, Printed Material Highest Education Level: College or Above Preferred Language: English Cognitive Barrier Assessment/Beliefs Language Barrier: No Translator Needed: No Memory Deficit: No Emotional Barrier: No Cultural/Religious Beliefs Affecting Medical Care: No Physical Barrier Assessment Impaired Vision: No Impaired Hearing: No Decreased Hand dexterity: No Knowledge/Comprehension Assessment Knowledge Level: Medium Comprehension Level: Medium Ability to understand written Medium instructions: Ability to understand verbal Medium instructions: Motivation Assessment Anxiety Level: Calm Cooperation: Cooperative Education Importance: Acknowledges Need Interest in Health Problems: Asks Questions Perception: Coherent Willingness to Engage in Self- Medium Management Activities: Readiness to Engage in Self- Medium Management Activities: Electronic Signature(s) Signed: 05/09/2017 4:26:07 PM By: Alric Quan Entered By: Alric Quan on 05/09/2017 10:36:18 Sow, Jeyda E. ([id number]) -------------------------------------------------------------------------------- Fall Risk Assessment  Details Patient Name: [name], [name] E. Date of Service: 05/09/2017 10:30 AM Medical Record Number: [id number] Patient Account Number: [id number] Date of Birth/Sex: September 18, 1929 (84 y.o. Female) Treating RN: Carolyne Fiscal, Debi Primary Care Raelin Pixler: Eliezer Lofts Other Clinician: Referring Myriam Brandhorst: Eliezer Lofts Treating Humbert Morozov/Extender: Cathie Olden in Treatment: 0 Fall Risk Assessment Items Have you had 2 or more falls in the last 12 monthso 0 No Have you had any fall that resulted in injury in the last 12 monthso 0 Yes FALL RISK ASSESSMENT: History of falling - immediate or within 3 months 25 Yes Secondary diagnosis 15 Yes Ambulatory aid None/bed rest/wheelchair/nurse 0 Yes Crutches/cane/walker 15 Yes Furniture 0 No IV  Access/Saline Lock 0 No Gait/Training Normal/bed rest/immobile 0 No Weak 10 Yes Impaired 20 Yes Mental Status Oriented to own ability 0 Yes Electronic Signature(s) Signed: 05/09/2017 4:26:07 PM By: Alric Quan Entered By: Alric Quan on 05/09/2017 10:36:56 Lingard, Destiney E. ([id number]) -------------------------------------------------------------------------------- Foot Assessment Details Patient Name: [name], [name] E. Date of Service: 05/09/2017 10:30 AM Medical Record Number: [id number] Patient Account Number: [id number] Date of Birth/Sex: 04/30/1929 (84 y.o. Female) Treating RN: Ahmed Prima Primary Care Keli Buehner: Eliezer Lofts Other Clinician: Referring Jaelan Rasheed: Eliezer Lofts Treating Suvi Archuletta/Extender: Cathie Olden in Treatment: 0 Foot Assessment Items Site Locations + = Sensation present, - = Sensation absent, C = Callus, U = Ulcer R = Redness, W = Warmth, M = Maceration, PU = Pre-ulcerative lesion F = Fissure, S = Swelling, D = Dryness Assessment Right: Left: Other Deformity: No No Prior Foot Ulcer: No No Prior Amputation: No No Charcot Joint: No No Ambulatory Status: Ambulatory With Help Assistance Device: Walker Gait:  Administrator, arts) Signed: 05/09/2017 4:26:07 PM By: Alric Quan Entered By: Alric Quan on 05/09/2017 10:38:23 Messler, Rashonda E. ([id number]) -------------------------------------------------------------------------------- Nutrition Risk Assessment Details Patient Name: [name], [name] E. Date of Service: 05/09/2017 10:30 AM Medical Record Number: [id number] Patient Account Number: [id number] Date of Birth/Sex: 09-27-1929 (84 y.o. Female) Treating RN: Ahmed Prima Primary Care Byan Poplaski: Eliezer Lofts Other Clinician: Referring Leshaun Biebel: Eliezer Lofts Treating Navjot Loera/Extender: Cathie Olden in Treatment: 0 Height (in): 63 Weight (lbs): Body Mass Index (BMI): Nutrition Risk Assessment Items NUTRITION RISK SCREEN: I have an illness or condition that made me change the kind and/or amount of 0 No food I eat I eat fewer than two meals per day 0 No I eat few fruits and vegetables, or milk products 0 No I have three or more drinks of beer, liquor or wine almost every day 0 No I have tooth or mouth problems that make it hard for me to eat 0 No I don't always have enough money to buy the food I need 0 No I eat alone most of the time 0 No I take three or more different prescribed or over-the-counter drugs a day 1 Yes Without wanting to, I have lost or gained 10 pounds in the last six months 0 No I am not always physically able to shop, cook and/or feed myself 0 No Nutrition Protocols Good Risk Protocol 0 No interventions needed Moderate Risk Protocol Electronic Signature(s) Signed: 05/09/2017 4:26:07 PM By: Alric Quan Entered By: Alric Quan on 05/09/2017 10:37:19

## 2017-05-10 NOTE — Progress Notes (Addendum)
[name], [name] ([id number]) Visit Report for 05/09/2017 Chief Complaint Document Details Patient Name: [name], [name] Date of Service: 05/09/2017 10:30 AM Medical Record Number: [id number] Patient Account Number: [id number] Date of Birth/Sex: [birth year] (84 y.o. Female) Treating RN: Ahmed Prima Primary Care Provider: Eliezer Lofts Other Clinician: Referring Provider: Eliezer Lofts Treating Provider/Extender: Cathie Olden in Treatment: 0 Information Obtained from: Patient Chief Complaint she presents for initial evaluation of a right posterior heel pressure ulcer Electronic Signature(s) Signed: 05/09/2017 4:14:39 PM By: Lawanda Cousins Entered By: Lawanda Cousins on 05/09/2017 15:24:00 [place], [place] ([id number]) -------------------------------------------------------------------------------- Debridement Details Patient Name: [name], [name] Date of Service: 05/09/2017 10:30 AM Medical Record Number: [id number] Patient Account Number: [id number] Date of Birth/Sex: 02-03-1930 (84 y.o. Female) Treating RN: Carolyne Fiscal, Debi Primary Care Provider: Eliezer Lofts Other Clinician: Referring Provider: Eliezer Lofts Treating Provider/Extender: Cathie Olden in Treatment: 0 Debridement Performed for Wound #1 Right Calcaneus Assessment: Performed By: Physician Lawanda Cousins, NP Debridement: Open Wound/Selective Debridement Description: Selective Pre-procedure Verification/Time Yes - 11:29 Out Taken: Start Time: 11:30 Pain Control: Lidocaine 4% Topical Solution Level: Non-Viable Tissue Total Area Debrided (L x W): 1.3 (cm) x 3 (cm) = 3.9 (cm) Tissue and other material Non-Viable, Eschar, Fibrin/Slough debrided: Instrument: Blade, Forceps Bleeding: None End Time: 11:32 Procedural Pain: 0 Post Procedural Pain: 0 Response to Treatment: Procedure was tolerated well Post Debridement Measurements of Total Wound Length: (cm) 1.3 Stage: Unstageable/Unclassified Width: (cm)  3 Depth: (cm) 0.4 Volume: (cm) 1.225 Character of Wound/Ulcer Post Requires Further Debridement Debridement: Post Procedure Diagnosis Same as Pre-procedure Electronic Signature(s) Signed: 05/09/2017 4:14:39 PM By: Lawanda Cousins Signed: 05/09/2017 4:26:07 PM By: Alric Quan Entered By: Lawanda Cousins on 05/09/2017 15:23:36 [place], [place]. ([id number]) -------------------------------------------------------------------------------- HPI Details Patient Name: [name], [name] Date of Service: 05/09/2017 10:30 AM Medical Record Number: [id number] Patient Account Number: [id number] Date of Birth/Sex: 12-21-1929 (84 y.o. Female) Treating RN: Carolyne Fiscal, Debi Primary Care Provider: Eliezer Lofts Other Clinician: Referring Provider: Eliezer Lofts Treating Provider/Extender: Cathie Olden in Treatment: 0 History of Present Illness Location: right posterior heel Quality: intermittent burning and sharp pains Duration: pressure ulcer has been present for >6 weeks Context: recurrent pressure Modifying Factors: prolonged and unrelieved pressure delay wound healing HPI Description: 05/09/17-she is here for initial evaluation of a right posterior heel ulcer, accompanied by her daughter. She was hospitalized in November for right hip fracture and repair. 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 She states that during her hospitalization she developed multiple pressure ulcers, the only remaining is the posterior right heel. Home health has been following. She has applied a variety of ointments including Vaseline and Desitin to the right heel. She states that approximately 4 years ago she followed with Dr. Oneida Alar at [facility] secondary to claudication. She states there is "nothing they can do" and denies ever having intervention. She is a former smoker, quitting >30 years ago She is a diabetic, her last A1c was 6.6 in  December. ABIs in the office were unable to be obtained. There is soft lifted eschar to the ulcer which has been selectively debrided. I recommended a second opinion from vascular medicine, the would prefer to stay local, therefore a referral to [place] [facility] has been sent. We will apply Santyl daily; she has been advised to contact the clinic as soon as possible if she is unable to afford the medication. She has been educated on the need  for aggressive offloading to the right heel. Electronic Signature(s) Signed: 05/09/2017 4:14:39 PM By: Lawanda Cousins Entered By: Lawanda Cousins on 05/09/2017 15:38:26 [place], [place] ([id number]) -------------------------------------------------------------------------------- Physical Exam Details Patient Name: [name], [name] Date of Service: 05/09/2017 10:30 AM Medical Record Number: [id number] Patient Account Number: [id number] Date of Birth/Sex: 1929-04-13 (84 y.o. Female) Treating RN: Ahmed Prima Primary Care Provider: Eliezer Lofts Other Clinician: Referring Provider: Eliezer Lofts Treating Provider/Extender: Lawanda Cousins Weeks in Treatment: 0 Respiratory non-labored. Cardiovascular non-palpable right pedal pulse; unable to obtain ABI in office. RLE- no edema, xerosis, warm to touch/toes cool. Musculoskeletal ambulates with walker assistance. Psychiatric does appear to have good insight or judgement to medical needs. oriented x4. Electronic Signature(s) Signed: 05/09/2017 4:14:39 PM By: Lawanda Cousins Entered By: Lawanda Cousins on 05/09/2017 15:33:06 [place], [place]aura EMarland Kitchen ([id number]) -------------------------------------------------------------------------------- Physician Orders Details Patient Name: [name] Reining [name]. Date of Service: 05/09/2017 10:30 AM Medical Record Number: [id number] Patient Account Number: [id number] Date of Birth/Sex: 1930-03-23 (84 y.o. Female) Treating RN: Carolyne Fiscal, Debi Primary Care Provider: Eliezer Lofts  Other Clinician: Referring Provider: Eliezer Lofts Treating Provider/Extender: Cathie Olden in Treatment: 0 Verbal / Phone Orders: Yes Clinician: Carolyne Fiscal, Debi Read Back and Verified: Yes Diagnosis Coding Wound Cleansing Wound #1 Right Calcaneus o Clean wound with Normal Saline. o Cleanse wound with mild soap and water o May Shower, gently pat wound dry prior to applying new dressing. Anesthetic (add to Medication List) Wound #1 Right Calcaneus o Topical Lidocaine 4% cream applied to wound bed prior to debridement (In Clinic Only). o Hurricaine Topical Anesthetic Spray applied to wound bed prior to debridement (In Clinic Only). Primary Wound Dressing Wound #1 Right Calcaneus o Santyl Ointment Secondary Dressing Wound #1 Right Calcaneus o Conform/Kerlix o Non-adherent pad o Other - allevyn heel cup stretch netting #5 Follow-up Appointments Wound #1 Right Calcaneus o Return Appointment in 1 week. Off-Loading Wound #1 Right Calcaneus o Turn and reposition every 2 hours o Other: - float heels while sitting or lying in the bed Additional Orders / Instructions Wound #1 Right Calcaneus o Increase protein intake. Services and Therapies o Arterial Studies- Bilateral - AVVS Patient Medications Allergies: codeine, metformin, penicillin, pioglitazone, fenofibrate [name], [name] ([id number]) Notifications Medication Indication Start End lidocaine DOSE 1 - topical 4 % cream - 1 cream topical Santyl 05/10/2017 DOSE topical 250 unit/gram ointment - ointment topical apply to wound as directed Electronic Signature(s) Signed: 05/10/2017 11:02:50 AM By: Linton Ham MD Signed: 05/11/2017 5:46:58 PM By: Lawanda Cousins Previous Signature: 05/09/2017 4:14:39 PM Version By: Lawanda Cousins Entered By: Linton Ham on 05/10/2017 11:02:49 [place], [place]  ([id number]) -------------------------------------------------------------------------------- Prescription 05/09/2017 Patient Name: [name] Reining [name]. Provider: Lawanda Cousins NP Date of Birth: Aug 14, 1929 NPI#: [id number] Sex: F DEA#: [id number] Phone #: [phone] License #: Patient Address: [facility] [address], [place], [place] [address] [place], [place] [address] [phone] Allergies codeine metformin penicillin pioglitazone fenofibrate Medication Medication: Route: Strength: Form: lidocaine 4 % topical cream topical 4% cream Class: TOPICAL LOCAL ANESTHETICS Dose: Frequency / Time: Indication: 1 1 cream topical Number of Refills: Number of Units: 0 Generic Substitution: Start Date: End Date: One Time Use: Substitution Permitted No Note to Pharmacy: Signature(s): Date(s): ALVERNA, FAWLEY ([id number]) Electronic Signature(s) Signed: 05/10/2017 4:49:25 PM By: Linton Ham MD Signed: 05/11/2017 5:46:58 PM By: Lawanda Cousins Previous Signature: 05/09/2017 4:14:39 PM Version By: Lawanda Cousins Entered By: Linton Ham on 05/10/2017 11:02:50  LAKRISTA, [place] ([id number]) --------------------------------------------------------------------------------  Problem List Details Patient Name: TANVI, GATLING. Date of Service: 05/09/2017 10:30 AM Medical Record Number: [id number] Patient Account Number: [id number] Date of Birth/Sex: Aug 10, 1929 (84 y.o. Female) Treating RN: Ahmed Prima Primary Care Provider: Eliezer Lofts Other Clinician: Referring Provider: Eliezer Lofts Treating Provider/Extender: Cathie Olden in Treatment: 0 Active Problems ICD-10 Encounter Code Description Active Date Diagnosis L89.610 Pressure ulcer of right heel, unstageable 05/09/2017 Yes I70.234 Atherosclerosis of native arteries of right leg with ulceration of heel 05/09/2017 Yes and  midfoot I21.A9 Other myocardial infarction type 05/09/2017 Yes R54 Age-related physical debility 05/09/2017 Yes Inactive Problems Resolved Problems Electronic Signature(s) Signed: 05/09/2017 4:14:39 PM By: Lawanda Cousins Entered By: Lawanda Cousins on 05/09/2017 15:22:47 [place], [place] ([id number]) -------------------------------------------------------------------------------- Progress Note Details Patient Name: [name], Shaquandra [name]. Date of Service: 05/09/2017 10:30 AM Medical Record Number: [id number] Patient Account Number: [id number] Date of Birth/Sex: 17-Oct-1929 (84 y.o. Female) Treating RN: Carolyne Fiscal, Debi Primary Care Provider: Eliezer Lofts Other Clinician: Referring Provider: Eliezer Lofts Treating Provider/Extender: Cathie Olden in Treatment: 0 Subjective Chief Complaint Information obtained from Patient she presents for initial evaluation of a right posterior heel pressure ulcer History of Present Illness (HPI) The following HPI elements were documented for the patient's wound: Location: right posterior heel Quality: intermittent burning and sharp pains Duration: pressure ulcer has been present for >6 weeks Context: recurrent pressure Modifying Factors: prolonged and unrelieved pressure delay wound healing 05/09/17-she is here for initial evaluation of a right posterior heel ulcer, accompanied by her daughter. She was hospitalized in November for right hip fracture and repair. 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 She states that during her hospitalization she developed multiple pressure ulcers, the only remaining is the posterior right heel. Home health has been following. She has applied a variety of ointments including Vaseline and Desitin to the right heel. She states that approximately 4 years ago she followed with Dr. Oneida Alar at [facility] secondary to claudication. She states there is  "nothing they can do" and denies ever having intervention. She is a former smoker, quitting >30 years ago She is a diabetic, her last A1c was 6.6 in December. ABIs in the office were unable to be obtained. There is soft lifted eschar to the ulcer which has been selectively debrided. I recommended a second opinion from vascular medicine, the would prefer to stay local, therefore a referral to [place] [facility] has been sent. We will apply Santyl daily; she has been advised to contact the clinic as soon as possible if she is unable to afford the medication. She has been educated on the need for aggressive offloading to the right heel. Wound History Patient presents with 2 open wounds that have been present for approximately Mar 11 2017. Patient has been treating wounds in the following manner: desitin. Laboratory tests have not been performed in the last month. Patient reportedly has not tested positive for an antibiotic resistant organism. Patient reportedly has not tested positive for osteomyelitis. Patient reportedly has had testing performed to evaluate circulation in the legs. Patient experiences the following problems associated with their wounds: swelling. Patient History Information obtained from Patient. Allergies codeine, metformin, penicillin, pioglitazone, fenofibrate Family History Cancer - Mother,Child, Diabetes - Child, Hypertension - Child, Thyroid Problems - Child, No family history of Heart Disease, Hereditary Spherocytosis, Kidney Disease, Lung Disease, Seizures, Stroke, Tuberculosis. Social History Former smoker - quit 50 yrs ago, Marital  Status - Widowed, Alcohol Use - Never, Drug Use - No History, Caffeine Use - Daily. [name], [name] ([id number]) Medical History Eyes Patient has history of Cataracts - surgery Hematologic/Lymphatic Patient has history of Anemia Cardiovascular Patient has history of Congestive Heart Failure, Myocardial  Infarction Endocrine Patient has history of Type II Diabetes Musculoskeletal Patient has history of Osteoarthritis Neurologic Patient has history of Neuropathy Patient is treated with Insulin. Blood sugar is tested. Review of Systems (ROS) Constitutional Symptoms (General Health) The patient has no complaints or symptoms. Ear/Nose/Mouth/Throat The patient has no complaints or symptoms. Cardiovascular ST elevation hyperlipidemia systolic murmur Gastrointestinal gerd Endocrine Complains or has symptoms of Thyroid disease. Genitourinary kidney disease stage II Integumentary (Skin) Complains or has symptoms of Wounds. Musculoskeletal femur fx Oncologic The patient has no complaints or symptoms. Psychiatric The patient has no complaints or symptoms. Objective Constitutional Vitals Time Taken: 10:25 AM, Height: 63 in, Source: Stated, Temperature: 97.7 F, Pulse: 89 bpm, Respiratory Rate: 16 breaths/min, Blood Pressure: 126/64 mmHg. Respiratory non-labored. Cardiovascular non-palpable right pedal pulse; unable to obtain ABI in office. RLE- no edema, xerosis, warm to touch/toes cool. TYJA, GORTNEY [name]. ([id number]) Musculoskeletal ambulates with walker assistance. Psychiatric does appear to have good insight or judgement to medical needs. oriented x4. Integumentary (Hair, Skin) Wound #1 status is Open. Original cause of wound was Pressure Injury. The wound is located on the Right Calcaneus. The wound measures 1.3cm length x 3cm width x 0.3cm depth; 3.063cm^2 area and 0.919cm^3 volume. There is no tunneling or undermining noted. There is a medium amount of serosanguineous drainage noted. The wound margin is distinct with the outline attached to the wound base. There is small (1-33%) granulation within the wound bed. There is a large (67-100%) amount of necrotic tissue within the wound bed including Adherent Slough. Periwound temperature was noted as No Abnormality. The periwound  has tenderness on palpation. Assessment Active Problems ICD-10 L89.610 - Pressure ulcer of right heel, unstageable I70.234 - Atherosclerosis of native arteries of right leg with ulceration of heel and midfoot I21.A9 - Other myocardial infarction type R54 - Age-related physical debility Procedures Wound #1 Pre-procedure diagnosis of Wound #1 is a Pressure Ulcer located on the Right Calcaneus . There was a Non-Viable Tissue Open Wound/Selective [phone]) debridement with total area of 3.9 sq cm performed by Lawanda Cousins, NP. with the following instrument(s): Blade and Forceps to remove Non-Viable tissue/material including Fibrin/Slough and Eschar after achieving pain control using Lidocaine 4% Topical Solution. A time out was conducted at 11:29, prior to the start of the procedure. There was no bleeding. The procedure was tolerated well with a pain level of 0 throughout and a pain level of 0 following the procedure. Post Debridement Measurements: 1.3cm length x 3cm width x 0.4cm depth; 1.225cm^3 volume. Post debridement Stage noted as Unstageable/Unclassified. Character of Wound/Ulcer Post Debridement requires further debridement. Post procedure Diagnosis Wound #1: Same as Pre-Procedure post selective debridement this does not appear to be significantly deep; hopefully this will respond to conservative therapy; but if her arterial disease is as compromised as she describes, she is at risk for limb loss if this regresses Plan [name], [name] ([id number]) Wound Cleansing: Wound #1 Right Calcaneus: Clean wound with Normal Saline. Cleanse wound with mild soap and water May Shower, gently pat wound dry prior to applying new dressing. Anesthetic (add to Medication List): Wound #1 Right Calcaneus: Topical Lidocaine 4% cream applied to wound bed prior to debridement (In Clinic Only). Hurricaine Topical Anesthetic Spray applied  to wound bed prior to debridement (In Clinic Only). Primary  Wound Dressing: Wound #1 Right Calcaneus: Santyl Ointment Secondary Dressing: Wound #1 Right Calcaneus: Conform/Kerlix Non-adherent pad Other - allevyn heel cup stretch netting #5 Follow-up Appointments: Wound #1 Right Calcaneus: Return Appointment in 1 week. Off-Loading: Wound #1 Right Calcaneus: Turn and reposition every 2 hours Other: - float heels while sitting or lying in the bed Additional Orders / Instructions: Wound #1 Right Calcaneus: Increase protein intake. Services and Therapies ordered were: Arterial Studies- Bilateral - AVVS The following medication(s) was prescribed: lidocaine topical 4 % cream 1 1 cream topical was prescribed at facility Santyl topical 250 unit/gram ointment ointment topical apply to wound as directed starting 05/10/2017 1. Referral to [facility] 2. Santyl to right posterior heel daily; if financially not feasible order Iodoflex/Iodosorb for home health every other day 3. Aggressive offloading 24/7 4. Follow-up next week Electronic Signature(s) Signed: 05/11/2017 5:49:55 PM By: Lawanda Cousins Previous Signature: 05/09/2017 4:14:39 PM Version By: Lawanda Cousins Entered By: Lawanda Cousins on 05/11/2017 17:48:21 [name], [place]. ([id number]) -------------------------------------------------------------------------------- ROS/PFSH Details Patient Name: [name], Harlow [name]. Date of Service: 05/09/2017 10:30 AM Medical Record Number: [id number] Patient Account Number: [id number] Date of Birth/Sex: Apr 26, 1929 (84 y.o. Female) Treating RN: Carolyne Fiscal, Debi Primary Care Provider: Eliezer Lofts Other Clinician: Referring Provider: Eliezer Lofts Treating Provider/Extender: Cathie Olden in Treatment: 0 Information Obtained From Patient Wound History Do you currently have one or more open woundso Yes How many open wounds do you currently haveo 2 Approximately how long have you had your woundso Mar 11 2017 How have you been treating your  wound(s) until nowo desitin Has your wound(s) ever healed and then re-openedo No Have you had any lab work done in the past montho No Have you tested positive for an antibiotic resistant organism (MRSA, VRE)o No Have you tested positive for osteomyelitis (bone infection)o No Have you had any tests for circulation on your legso Yes Who ordered the testo 2014 Have you had other problems associated with your woundso Swelling Endocrine Complaints and Symptoms: Positive for: Thyroid disease Medical History: Positive for: Type II Diabetes Time with diabetes: 15 yrs Treated with: Insulin Blood sugar tested every day: Yes Tested : Integumentary (Skin) Complaints and Symptoms: Positive for: Wounds Constitutional Symptoms (General Health) Complaints and Symptoms: No Complaints or Symptoms Eyes Medical History: Positive for: Cataracts - surgery Ear/Nose/Mouth/Throat Complaints and Symptoms: No Complaints or Symptoms Hematologic/Lymphatic [name], [name] ([id number]) Medical History: Positive for: Anemia Cardiovascular Complaints and Symptoms: Review of System Notes: ST elevation hyperlipidemia systolic murmur Medical History: Positive for: Congestive Heart Failure; Myocardial Infarction Gastrointestinal Complaints and Symptoms: Review of System Notes: gerd Genitourinary Complaints and Symptoms: Review of System Notes: kidney disease stage II Musculoskeletal Complaints and Symptoms: Review of System Notes: femur fx Medical History: Positive for: Osteoarthritis Neurologic Medical History: Positive for: Neuropathy Oncologic Complaints and Symptoms: No Complaints or Symptoms Psychiatric Complaints and Symptoms: No Complaints or Symptoms HBO Extended History Items Eyes: Cataracts Immunizations Pneumococcal Vaccine: Received Pneumococcal Vaccination: Yes Implantable Devices [name], [name] ([id number]) Family and Social History Cancer: Yes - Mother,Child;  Diabetes: Yes - Child; Heart Disease: No; Hereditary Spherocytosis: No; Hypertension: Yes - Child; Kidney Disease: No; Lung Disease: No; Seizures: No; Stroke: No; Thyroid Problems: Yes - Child; Tuberculosis: No; Former smoker - quit 50 yrs ago; Marital Status - Widowed; Alcohol Use: Never; Drug Use: No History; Caffeine Use: Daily; Financial Concerns: No; Food, Clothing or Shelter Needs: No; Support System Lacking:  No; Transportation Concerns: No; Advanced Directives: No; Patient does not want information on Advanced Directives; Do not resuscitate: No; Living Will: Yes (Not Provided); Medical Power of Attorney: No Electronic Signature(s) Signed: 05/09/2017 4:14:39 PM By: Lawanda Cousins Signed: 05/09/2017 4:26:07 PM By: Alric Quan Entered By: Alric Quan on 05/09/2017 10:34:53 [name], [name] ([id number]) -------------------------------------------------------------------------------- SuperBill Details Patient Name: FLIPPEN, Oluwatomisin [name]. Date of Service: 05/09/2017 Medical Record Number: [id number] Patient Account Number: [id number] Date of Birth/Sex: [birth year] (84 y.o. Female) Treating RN: Ahmed Prima Primary Care Provider: Eliezer Lofts Other Clinician: Referring Provider: Eliezer Lofts Treating Provider/Extender: Cathie Olden in Treatment: 0 Diagnosis Coding ICD-10 Codes Code Description L89.610 Pressure ulcer of right heel, unstageable I70.234 Atherosclerosis of native arteries of right leg with ulceration of heel and midfoot I21.A9 Other myocardial infarction type R54 Age-related physical debility Facility Procedures CPT4 Code Description: [id number] 99213 - WOUND CARE VISIT-LEV 3 EST PT Modifier: Quantity: 1 CPT4 Code Description: [id number] 97597 - DEBRIDE WOUND 1ST 20 SQ CM OR < ICD-10 Diagnosis Description L89.610 Pressure ulcer of right heel, unstageable I70.234 Atherosclerosis of native arteries of right leg with ulceration of Modifier: heel and midf Quantity: 1  oot Physician Procedures CPT4 Code Description: [id number] 99213 - WC PHYS LEVEL 3 - EST PT ICD-10 Diagnosis Description L89.610 Pressure ulcer of right heel, unstageable I70.234 Atherosclerosis of native arteries of right leg with ulceration of I21.A9 Other myocardial infarction  type R54 Age-related physical debility Modifier: heel and midfo Quantity: 1 ot CPT4 Code Description: [id number] 88916 - WC PHYS DEBR WO ANESTH 20 SQ CM ICD-10 Diagnosis Description L89.610 Pressure ulcer of right heel, unstageable I70.234 Atherosclerosis of native arteries of right leg with ulceration of Modifier: heel and midfo Quantity: 1 ot Electronic Signature(s) Signed: 05/09/2017 4:29:13 PM By: Alric Quan Signed: 05/11/2017 5:46:58 PM By: Lawanda Cousins Previous Signature: 05/09/2017 4:14:39 PM Version By: Lawanda Cousins Entered By: Alric Quan on 05/09/2017 16:29:12

## 2017-05-16 ENCOUNTER — Ambulatory Visit (INDEPENDENT_AMBULATORY_CARE_PROVIDER_SITE_OTHER): Payer: PPO | Admitting: Family Medicine

## 2017-05-16 ENCOUNTER — Other Ambulatory Visit: Payer: Self-pay

## 2017-05-16 ENCOUNTER — Encounter: Payer: Self-pay | Admitting: Family Medicine

## 2017-05-16 ENCOUNTER — Ambulatory Visit (INDEPENDENT_AMBULATORY_CARE_PROVIDER_SITE_OTHER)
Admission: RE | Admit: 2017-05-16 | Discharge: 2017-05-16 | Disposition: A | Discharge status: Home / Self Care | Payer: PPO | Source: Ambulatory Visit | Attending: Family Medicine | Admitting: Family Medicine

## 2017-05-16 VITALS — BP 107/57 | HR 82 | Temp 98.2°F | Ht 63.5 in | Wt 139.2 lb

## 2017-05-16 DIAGNOSIS — E119 Type 2 diabetes mellitus without complications: Secondary | ICD-10-CM

## 2017-05-16 DIAGNOSIS — L89612 Pressure ulcer of right heel, stage 2: Secondary | ICD-10-CM

## 2017-05-16 DIAGNOSIS — I10 Essential (primary) hypertension: Secondary | ICD-10-CM | POA: Diagnosis not present

## 2017-05-16 DIAGNOSIS — E1159 Type 2 diabetes mellitus with other circulatory complications: Secondary | ICD-10-CM | POA: Diagnosis not present

## 2017-05-16 DIAGNOSIS — S72001A Fracture of unspecified part of neck of right femur, initial encounter for closed fracture: Secondary | ICD-10-CM

## 2017-05-16 DIAGNOSIS — J90 Pleural effusion, not elsewhere classified: Secondary | ICD-10-CM | POA: Diagnosis not present

## 2017-05-16 DIAGNOSIS — I739 Peripheral vascular disease, unspecified: Secondary | ICD-10-CM | POA: Diagnosis not present

## 2017-05-16 DIAGNOSIS — J449 Chronic obstructive pulmonary disease, unspecified: Secondary | ICD-10-CM | POA: Diagnosis not present

## 2017-05-16 DIAGNOSIS — I5021 Acute systolic (congestive) heart failure: Secondary | ICD-10-CM

## 2017-05-16 DIAGNOSIS — R0609 Other forms of dyspnea: Secondary | ICD-10-CM | POA: Diagnosis not present

## 2017-05-16 DIAGNOSIS — E1349 Other specified diabetes mellitus with other diabetic neurological complication: Secondary | ICD-10-CM | POA: Diagnosis not present

## 2017-05-16 LAB — HEMOGLOBIN A1C: Hgb A1c MFr Bld: 6.1 % (ref 4.6–6.5)

## 2017-05-16 LAB — BASIC METABOLIC PANEL
BUN: 19 mg/dL (ref 6–23)
CALCIUM: 9 mg/dL (ref 8.4–10.5)
CO2: 28 meq/L (ref 19–32)
Chloride: 100 mEq/L (ref 96–112)
Creatinine, Ser: 0.8 mg/dL (ref 0.40–1.20)
GFR: 72.42 mL/min (ref >60.00)
GLUCOSE: 118 mg/dL — AB (ref 70–99)
POTASSIUM: 4.1 meq/L (ref 3.5–5.1)
SODIUM: 134 meq/L — AB (ref 135–145)

## 2017-05-16 LAB — BRAIN NATRIURETIC PEPTIDE: Pro B Natriuretic peptide (BNP): 185 pg/mL — ABNORMAL HIGH (ref 0.0–100.0)

## 2017-05-16 NOTE — Patient Instructions (Addendum)
Hold tramadol unless moderate to sever pain.  Hold flexeril.  Can use 1 tyelnol as need during the day for any pain, can use 2 a t night if discomft causing issues with sleep.  Increase activity as much as able.  Goal blood sugars fasting 80-120,  2 hours after meals goal < 180.  Continue lasix 20 mg daily.

## 2017-05-16 NOTE — Progress Notes (Signed)
Subjective:    Patient ID: [name], female    DOB: 04-Aug-1929, 84 y.o.   MRN: [id number]  HPI    84 year old female presents for 2 week follow up. Patient underwent intertrochanteric fixation right hip open reduction fixation right wrist,November 21. Her postoperative was complicated by non-ST elevation myocardial infarction.Which was treated medically, due to gastrointestinal bleeding and symptomatic anemia.   No increase in shortness of breath but DOE, fatigue with exertion.. She does have dry cough ongoing for a while, even before she fell. No increase in swelling in ankles. She is taking lasix daily.  Wt Readings from Last 3 Encounters:  05/16/17 139 lb 4 oz (63.2 kg)  03/17/17 151 lb (68.5 kg)  07/22/16 149 lb 8 oz (67.8 kg)   Right heel decubitus ulcer: now followed by wound care center, seeing them weekly.  Wound is filling in overall. They felt decreased pulse in that leg... Hx of PAD, Referred to vascular MD.   She is feeling stronger overall.. Able to walk with walker.   FBS  137-140, occ post prandial 171  one 57, skipped meals.  She reports insomnia at night, but tylenol makes her sleepy.  She is using tramadol or tylenol at night. No longer having pain  Blood pressure (!) 107/57, pulse 82, temperature 98.2 F (36.8 C), temperature source Oral, height 5' 3.5" (1.613 m), weight 139 lb 4 oz (63.2 kg).   Review of Systems  Constitutional: Negative for fatigue and fever.  HENT: Negative for congestion.   Eyes: Negative for pain.  Respiratory: Positive for cough and shortness of breath. Negative for wheezing.   Cardiovascular: Negative for chest pain, palpitations and leg swelling.  Gastrointestinal: Negative for abdominal pain.  Genitourinary: Negative for dysuria and vaginal bleeding.  Musculoskeletal: Negative for back pain.  Neurological: Negative for syncope, light-headedness and headaches.  Psychiatric/Behavioral: Negative for dysphoric mood.       Objective:   Physical Exam  Constitutional: Vital signs are normal. She appears well-developed and well-nourished. She is cooperative.  Non-toxic appearance. She does not appear ill. No distress.  In wheelcahir  HENT:  Head: Normocephalic.  Right Ear: Hearing, tympanic membrane, external ear and ear canal normal. Tympanic membrane is not erythematous, not retracted and not bulging.  Left Ear: Hearing, tympanic membrane, external ear and ear canal normal. Tympanic membrane is not erythematous, not retracted and not bulging.  Nose: No mucosal edema or rhinorrhea. Right sinus exhibits no maxillary sinus tenderness and no frontal sinus tenderness. Left sinus exhibits no maxillary sinus tenderness and no frontal sinus tenderness.  Mouth/Throat: Uvula is midline, oropharynx is clear and moist and mucous membranes are normal.  Eyes: Conjunctivae, EOM and lids are normal. Pupils are equal, round, and reactive to light. Lids are everted and swept, no foreign bodies found.  Neck: Trachea normal and normal range of motion. Neck supple. Carotid bruit is not present. No thyroid mass and no thyromegaly present.  Cardiovascular: Normal rate, regular rhythm, S1 normal, S2 normal, normal heart sounds and intact distal pulses. Exam reveals no gallop and no friction rub.  No murmur heard. Pulses:      Dorsalis pedis pulses are 0 on the right side, and 0 on the left side.   1 plus pitting edema bilateral ankles  Pulmonary/Chest: Effort normal. No tachypnea. No respiratory distress. She has no decreased breath sounds. She has no wheezes. She has no rhonchi. She has rales in the right lower field and the left lower  field.  Abdominal: Soft. Normal appearance and bowel sounds are normal. There is no tenderness.  Neurological: She is alert.  Skin: Skin is warm, dry and intact. No rash noted.  Right heel bandaged  Psychiatric: Her speech is normal and behavior is normal. Judgment and thought content normal. Her mood  appears not anxious. Cognition and memory are normal. She does not exhibit a depressed mood.          Assessment & Plan:

## 2017-05-16 NOTE — Assessment & Plan Note (Signed)
Improved with wound clinic care.

## 2017-05-16 NOTE — Assessment & Plan Note (Signed)
Well controlled. Continue current medication.  

## 2017-05-16 NOTE — Assessment & Plan Note (Signed)
Moderate control of blood sugar.. Discussed low carb diet.  Lab Results  Component Value Date   HGBA1C 6.6 (H) 03/01/2017

## 2017-05-16 NOTE — Assessment & Plan Note (Signed)
PT progressing well, pt getting stronger.

## 2017-05-16 NOTE — Assessment & Plan Note (Signed)
Awaiting vascular referral for  Further treatment options.

## 2017-05-16 NOTE — Assessment & Plan Note (Addendum)
ECHO 20% EF. Mild edema, no significant weight gain   DOE and ? new crackles on lung exam.  Send for CXR and BNP CXR showed bilateral atelectasis.. No pulmonary edema or  Effusions.

## 2017-05-19 ENCOUNTER — Encounter: Payer: PPO | Attending: Nurse Practitioner | Admitting: Physician Assistant

## 2017-05-19 DIAGNOSIS — Z96649 Presence of unspecified artificial hip joint: Secondary | ICD-10-CM | POA: Insufficient documentation

## 2017-05-19 DIAGNOSIS — I219 Acute myocardial infarction, unspecified: Secondary | ICD-10-CM | POA: Diagnosis not present

## 2017-05-19 DIAGNOSIS — I252 Old myocardial infarction: Secondary | ICD-10-CM | POA: Diagnosis not present

## 2017-05-19 DIAGNOSIS — L89613 Pressure ulcer of right heel, stage 3: Secondary | ICD-10-CM | POA: Insufficient documentation

## 2017-05-19 DIAGNOSIS — Z87891 Personal history of nicotine dependence: Secondary | ICD-10-CM | POA: Insufficient documentation

## 2017-05-19 DIAGNOSIS — I70234 Atherosclerosis of native arteries of right leg with ulceration of heel and midfoot: Secondary | ICD-10-CM | POA: Diagnosis not present

## 2017-05-22 NOTE — Progress Notes (Signed)
EARNESTINE, SHIPP ([id number]) Visit Report for 05/19/2017 Arrival Information Details Patient Name: [name], [name] Date of Service: 05/19/2017 9:45 AM Medical Record Number: [id number] Patient Account Number: [id number] Date of Birth/Sex: [birth year] (84 y.o. Female) Treating RN: Carolyne Fiscal, Debi Primary Care October Peery: Eliezer Lofts Other Clinician: Referring Pernie Grosso: Eliezer Lofts Treating Uriyah Massimo/Extender: Melburn Hake, HOYT Weeks in Treatment: 1 Visit Information History Since Last Visit All ordered tests and consults were completed: No Patient Arrived: Gilford Rile Added or deleted any medications: No Arrival Time: 09:56 Any new allergies or adverse reactions: No Accompanied By: daughter Had a fall or experienced change in No Transfer Assistance: EasyPivot Patient activities of daily living that may affect Lift risk of falls: Patient Identification Verified: Yes Signs or symptoms of abuse/neglect since last visito No Secondary Verification Process Yes Hospitalized since last visit: No Completed: Has Dressing in Place as Prescribed: Yes Patient Requires Transmission-Based No Precautions: Pain Present Now: Yes Patient Has Alerts: Yes Patient Alerts: DM II unable to get ABI inaudib Electronic Signature(s) Signed: 05/19/2017 4:52:03 PM By: Alric Quan Entered By: Alric Quan on 05/19/2017 09:58:45 [place], [place]. ([id number]) -------------------------------------------------------------------------------- Encounter Discharge Information Details Patient Name: WINGERT, Samanatha E. Date of Service: 05/19/2017 9:45 AM Medical Record Number: [id number] Patient Account Number: [id number] Date of Birth/Sex: 20-Feb-1930 (84 y.o. Female) Treating RN: Carolyne Fiscal, Debi Primary Care Shaynna Husby: Eliezer Lofts Other Clinician: Referring Makynlie Rossini: Eliezer Lofts Treating Armel Rabbani/Extender: Melburn Hake, HOYT Weeks in Treatment: 1 Encounter Discharge Information Items Discharge Pain Level: 0 Discharge  Condition: Stable Ambulatory Status: Walker Discharge Destination: Home Transportation: Private Auto Accompanied By: daughter Schedule Follow-up Appointment: Yes Medication Reconciliation completed and No provided to Patient/Care Malayasia Mirkin: Patient Clinical Summary of Care: Declined Electronic Signature(s) Signed: 05/19/2017 10:59:58 AM By: Lorine Bears RCP, RRT, CHT Entered By: Lorine Bears on 05/19/2017 10:59:57 Neisler, Khloee E. ([id number]) -------------------------------------------------------------------------------- Lower Extremity Assessment Details Patient Name: [name], [name] Date of Service: 05/19/2017 9:45 AM Medical Record Number: [id number] Patient Account Number: [id number] Date of Birth/Sex: [birth year] (84 y.o. Female) Treating RN: Carolyne Fiscal, Debi Primary Care Shanica Castellanos: Eliezer Lofts Other Clinician: Referring Niyam Bisping: Eliezer Lofts Treating Corinna Burkman/Extender: Melburn Hake, HOYT Weeks in Treatment: 1 Vascular Assessment Pulses: Dorsalis Pedis Palpable: [Right:No] Doppler Audible: [Right:Yes] Posterior Tibial Extremity colors, hair growth, and conditions: Extremity Color: [Right:Normal] Temperature of Extremity: [Right:Cool] Capillary Refill: [Right:> 3 seconds] Electronic Signature(s) Signed: 05/19/2017 4:52:03 PM By: Alric Quan Entered By: Alric Quan on 05/19/2017 10:10:37 [place], [place] ([id number]) -------------------------------------------------------------------------------- Multi Wound Chart Details Patient Name: [name] Randy, [name] Date of Service: 05/19/2017 9:45 AM Medical Record Number: [id number] Patient Account Number: [id number] Date of Birth/Sex: 1929/11/22 (84 y.o. Female) Treating RN: Carolyne Fiscal, Debi Primary Care Ardene Remley: Eliezer Lofts Other Clinician: Referring Azhar Yogi: Eliezer Lofts Treating Tanaysha Alkins/Extender: Melburn Hake, HOYT Weeks in Treatment: 1 Vital Signs Height(in): 34 Pulse(bpm):  75 Weight(lbs): Blood Pressure(mmHg): 116/58 Body Mass Index(BMI): Temperature(F): 97.7 Respiratory Rate 16 (breaths/min): Photos: [1:No Photos] [N/A:N/A] Wound Location: [1:Right Calcaneus] [N/A:N/A] Wounding Event: [1:Pressure Injury] [N/A:N/A] Primary Etiology: [1:Pressure Ulcer] [N/A:N/A] Comorbid History: [1:Cataracts, Anemia, Congestive Heart Failure, Myocardial Infarction, Type II Diabetes, Osteoarthritis, Neuropathy] [N/A:N/A] Date Acquired: [1:03/11/2017] [N/A:N/A] Weeks of Treatment: [1:1] [N/A:N/A] Wound Status: [1:Open] [N/A:N/A] Measurements L x W x D [1:1x2.5x0.5] [N/A:N/A] (cm) Area (cm) : [1:1.963] [N/A:N/A] Volume (cm) : [1:0.982] [N/A:N/A] % Reduction in Area: [1:35.90%] [N/A:N/A] % Reduction in Volume: [1:-6.90%] [N/A:N/A] Classification: [1:Category/Stage III] [N/A:N/A] Exudate Amount: [1:Large] [N/A:N/A] Exudate Type: [1:Serous] [N/A:N/A] Exudate Color: [1:amber] [N/A:N/A] Wound Margin: [1:Distinct, outline attached] [N/A:N/A] Granulation Amount: [1:Small (1-33%)] [N/A:N/A]  Necrotic Amount: [1:Large (67-100%)] [N/A:N/A] Exposed Structures: [1:Fascia: No Fat Layer (Subcutaneous Tissue) Exposed: No Tendon: No Muscle: No Joint: No Bone: No] [N/A:N/A] Epithelialization: [1:None] [N/A:N/A] Periwound Skin Texture: [1:No Abnormalities Noted] [N/A:N/A] Periwound Skin Moisture: [1:Maceration: Yes] [N/A:N/A] Periwound Skin Color: [1:No Abnormalities Noted] [N/A:N/A] Temperature: [1:No Abnormality] [N/A:N/A] Tenderness on Palpation: Yes N/A N/A Wound Preparation: Ulcer Cleansing: N/A N/A Rinsed/Irrigated with Saline Topical Anesthetic Applied: Other: lidocaine 4% Treatment Notes Electronic Signature(s) Signed: 05/19/2017 4:52:03 PM By: Alric Quan Entered By: Alric Quan on 05/19/2017 10:35:40 Cost, Lalonnie EMarland Kitchen ([id number]) -------------------------------------------------------------------------------- Multi-Disciplinary Care Plan  Details Patient Name: [name], [name] Date of Service: 05/19/2017 9:45 AM Medical Record Number: [id number] Patient Account Number: [id number] Date of Birth/Sex: 11-01-1929 (84 y.o. Female) Treating RN: Carolyne Fiscal, Debi Primary Care Felicha Frayne: Eliezer Lofts Other Clinician: Referring Maryhelen Lindler: Eliezer Lofts Treating Kieon Lawhorn/Extender: Melburn Hake, HOYT Weeks in Treatment: 1 Active Inactive ` Abuse / Safety / Falls / Self Care Management Nursing Diagnoses: History of Falls Potential for falls Goals: Patient will not experience any injury related to falls Date Initiated: 05/09/2017 Target Resolution Date: 08/19/2017 Goal Status: Active Interventions: Assess fall risk on admission and as needed Assess: immobility, friction, shearing, incontinence upon admission and as needed Assess impairment of mobility on admission and as needed per policy Assess personal safety and home safety (as indicated) on admission and as needed Assess self care needs on admission and as needed Provide education on basic hygiene Provide education on fall prevention Treatment Activities: Education provided on Basic Hygiene : 05/09/2017 Notes: ` Nutrition Nursing Diagnoses: Imbalanced nutrition Impaired glucose control: actual or potential Potential for alteratiion in Nutrition/Potential for imbalanced nutrition Goals: Patient/caregiver agrees to and verbalizes understanding of need to use nutritional supplements and/or vitamins as prescribed Date Initiated: 05/09/2017 Target Resolution Date: 08/19/2017 Goal Status: Active Patient/caregiver will maintain therapeutic glucose control Date Initiated: 05/09/2017 Target Resolution Date: 08/19/2017 Goal Status: Active GRAYSEN, DEPAULA ([id number]) Interventions: Assess patient nutrition upon admission and as needed per policy Provide education on elevated blood sugars and impact on wound healing Provide education on nutrition Treatment Activities: Education  provided on Nutrition : 05/09/2017 Notes: ` Orientation to the Wound Care Program Nursing Diagnoses: Knowledge deficit related to the wound healing center program Goals: Patient/caregiver will verbalize understanding of the [place] Date Initiated: 05/09/2017 Target Resolution Date: 05/20/2017 Goal Status: Active Interventions: Provide education on orientation to the wound center Notes: ` Pain, Acute or Chronic Nursing Diagnoses: Pain, acute or chronic: actual or potential Potential alteration in comfort, pain Goals: Patient/caregiver will verbalize adequate pain control between visits Date Initiated: 05/09/2017 Target Resolution Date: 08/19/2017 Goal Status: Active Interventions: Complete pain assessment as per visit requirements Encourage patient to take pain medications as prescribed Implement pain control techniques (non-pharmaceutical) Notes: ` Pressure Nursing Diagnoses: Knowledge deficit related to causes and risk factors for pressure ulcer development Knowledge deficit related to management of pressures ulcers Potential for impaired tissue integrity related to pressure, friction, moisture, and shear Imm, Siearra E. ([id number]) Goals: Patient will remain free from development of additional pressure ulcers Date Initiated: 05/09/2017 Target Resolution Date: 08/19/2017 Goal Status: Active Interventions: Assess: immobility, friction, shearing, incontinence upon admission and as needed Assess offloading mechanisms upon admission and as needed Assess potential for pressure ulcer upon admission and as needed Provide education on pressure ulcers Notes: ` Wound/Skin Impairment Nursing Diagnoses: Impaired tissue integrity Knowledge deficit related to ulceration/compromised skin integrity Goals: Ulcer/skin breakdown will have a volume reduction of 80% by week 12 Date Initiated:  05/09/2017 Target Resolution Date: 08/19/2017 Goal Status:  Active Interventions: Assess patient/caregiver ability to perform ulcer/skin care regimen upon admission and as needed Assess ulceration(s) every visit Notes: Electronic Signature(s) Signed: 05/19/2017 4:52:03 PM By: Alric Quan Entered By: Alric Quan on 05/19/2017 10:10:42 Loja, Kambra E. ([id number]) -------------------------------------------------------------------------------- Pain Assessment Details Patient Name: [name] Randy, Pebble E. Date of Service: 05/19/2017 9:45 AM Medical Record Number: [id number] Patient Account Number: [id number] Date of Birth/Sex: 07-07-1929 (84 y.o. Female) Treating RN: Carolyne Fiscal, Debi Primary Care Averill Winters: Eliezer Lofts Other Clinician: Referring Geana Walts: Eliezer Lofts Treating Aitana Burry/Extender: Melburn Hake, HOYT Weeks in Treatment: 1 Active Problems Location of Pain Severity and Description of Pain Patient Has Paino Yes Site Locations Rate the pain. Current Pain Level: 2 Character of Pain Describe the Pain: Aching Pain Management and Medication Current Pain Management: Notes Topical or injectable lidocaine is offered to patient for acute pain when surgical debridement is performed. If needed, Patient is instructed to use over the counter pain medication for the following 24-48 hours after debridement. Wound care MDs do not prescribed pain medications. Patient has chronic pain or uncontrolled pain. Patient has been instructed to make an appointment with their Primary Care Physician for pain management. Electronic Signature(s) Signed: 05/19/2017 4:52:03 PM By: Alric Quan Entered By: Alric Quan on 05/19/2017 09:59:00 Staat, Blanchie EMarland Kitchen ([id number]) -------------------------------------------------------------------------------- Patient/Caregiver Education Details Patient Name: [name]. Date of Service: 05/19/2017 9:45 AM Medical Record Number: [id number] Patient Account Number: [id number] Date of Birth/Gender: 05/01/1929 (84  y.o. Female) Treating RN: Carolyne Fiscal, Debi Primary Care Physician: Eliezer Lofts Other Clinician: Referring Physician: Eliezer Lofts Treating Physician/Extender: Sharalyn Ink in Treatment: 1 Education Assessment Education Provided To: Patient and Caregiver daughter Education Topics Provided Wound/Skin Impairment: Handouts: Caring for Your Ulcer, Other: change dressing as ordered Methods: Demonstration, Explain/Verbal Responses: State content correctly Electronic Signature(s) Signed: 05/19/2017 4:52:03 PM By: Alric Quan Entered By: Alric Quan on 05/19/2017 10:36:44 [place], [place]. ([id number]) -------------------------------------------------------------------------------- Wound Assessment Details Patient Name: [name] Randy, Adra E. Date of Service: 05/19/2017 9:45 AM Medical Record Number: [id number] Patient Account Number: [id number] Date of Birth/Sex: 11-06-1929 (84 y.o. Female) Treating RN: Carolyne Fiscal, Debi Primary Care Tremond Shimabukuro: Eliezer Lofts Other Clinician: Referring Lenetta Piche: Eliezer Lofts Treating Mykira Hofmeister/Extender: Melburn Hake, HOYT Weeks in Treatment: 1 Wound Status Wound Number: 1 Primary Pressure Ulcer Etiology: Wound Location: Right Calcaneus Wound Open Wounding Event: Pressure Injury Status: Date Acquired: 03/11/2017 Comorbid Cataracts, Anemia, Congestive Heart Failure, Weeks Of Treatment: 1 History: Myocardial Infarction, Type II Diabetes, Clustered Wound: No Osteoarthritis, Neuropathy Photos Photo Uploaded By: Alric Quan on 05/19/2017 16:50:15 Wound Measurements Length: (cm) 1 Width: (cm) 2.5 Depth: (cm) 0.5 Area: (cm) 1.963 Volume: (cm) 0.982 % Reduction in Area: 35.9% % Reduction in Volume: -6.9% Epithelialization: None Tunneling: No Undermining: No Wound Description Classification: Category/Stage III Wound Margin: Distinct, outline attached Exudate Amount: Large Exudate Type: Serous Exudate Color: amber Slough/Fibrino  Yes Wound Bed Granulation Amount: Small (1-33%) Exposed Structure Necrotic Amount: Large (67-100%) Fascia Exposed: No Necrotic Quality: Adherent Slough Fat Layer (Subcutaneous Tissue) Exposed: No Tendon Exposed: No Muscle Exposed: No Joint Exposed: No Bone Exposed: No Periwound Skin Texture Eisenberger, Nasira E. ([id number]) Texture Color No Abnormalities Noted: No No Abnormalities Noted: No Moisture Temperature / Pain No Abnormalities Noted: No Temperature: No Abnormality Maceration: Yes Tenderness on Palpation: Yes Wound Preparation Ulcer Cleansing: Rinsed/Irrigated with Saline Topical Anesthetic Applied: Other: lidocaine 4%, Treatment Notes Wound #1 (Right Calcaneus) 1. Cleansed with: Clean wound with Normal Saline 2. Anesthetic Topical Lidocaine 4% cream to wound  bed prior to debridement 4. Dressing Applied: Santyl Ointment 5. Secondary Dressing Applied Kerlix/Conform Non-Adherent pad 7. Secured with Tape Notes allevyn heel cups, netting Electronic Signature(s) Signed: 05/19/2017 4:52:03 PM By: Alric Quan Entered By: Alric Quan on 05/19/2017 10:09:54 Shabazz, Jenavie E. ([id number]) -------------------------------------------------------------------------------- Vitals Details Patient Name: BIVENS, Everlee E. Date of Service: 05/19/2017 9:45 AM Medical Record Number: [id number] Patient Account Number: [id number] Date of Birth/Sex: 02-06-1930 (83 y.o. Female) Treating RN: Carolyne Fiscal, Debi Primary Care Enoc Getter: Eliezer Lofts Other Clinician: Referring Lorae Roig: Eliezer Lofts Treating Karris Deangelo/Extender: Melburn Hake, HOYT Weeks in Treatment: 1 Vital Signs Time Taken: 09:59 Temperature (F): 97.7 Height (in): 63 Pulse (bpm): 84 Respiratory Rate (breaths/min): 16 Blood Pressure (mmHg): 116/58 Reference Range: 80 - 120 mg / dl Electronic Signature(s) Signed: 05/19/2017 4:52:03 PM By: Alric Quan Entered By: Alric Quan on 05/19/2017 10:01:51

## 2017-05-24 NOTE — Progress Notes (Signed)
BESS, SALTZMAN ([id number]) Visit Report for 05/19/2017 Chief Complaint Document Details Patient Name: [name], [name] Date of Service: 05/19/2017 9:45 AM Medical Record Number: [id number] Patient Account Number: [id number] Date of Birth/Sex: 1929/12/04 (84 y.o. Female) Treating RN: Carolyne Fiscal, Debi Primary Care Provider: Eliezer Lofts Other Clinician: Referring Provider: Eliezer Lofts Treating Provider/Extender: Melburn Hake, Daequan Kozma Weeks in Treatment: 1 Information Obtained from: Patient Chief Complaint she presents for initial evaluation of a right posterior heel pressure ulcer Electronic Signature(s) Signed: 05/22/2017 9:06:28 AM By: Worthy Keeler PA-C Entered By: Worthy Keeler on 05/19/2017 09:44:12 [place], [place]. ([id number]) -------------------------------------------------------------------------------- Debridement Details Patient Name: [name] Date of Service: 05/19/2017 9:45 AM Medical Record Number: [id number] Patient Account Number: [id number] Date of Birth/Sex: [birth year] (84 y.o. Female) Treating RN: Carolyne Fiscal, Debi Primary Care Provider: Eliezer Lofts Other Clinician: Referring Provider: Eliezer Lofts Treating Provider/Extender: Melburn Hake, Athziry Millican Weeks in Treatment: 1 Debridement Performed for Wound #1 Right Calcaneus Assessment: Performed By: Physician [name] III, Daisuke Bailey [name]., PA-C Debridement: Debridement Pre-procedure Verification/Time Yes - 10:39 Out Taken: Start Time: 10:40 Pain Control: Lidocaine 4% Topical Solution Level: Skin/Subcutaneous Tissue Total Area Debrided (L x W): 1 (cm) x 2.5 (cm) = 2.5 (cm) Tissue and other material Viable, Non-Viable, Exudate, Fibrin/Slough, Subcutaneous debrided: Instrument: Curette Bleeding: Minimum Hemostasis Achieved: Pressure End Time: 10:45 Procedural Pain: 0 Post Procedural Pain: 0 Response to Treatment: Procedure was tolerated well Post Debridement Measurements of Total Wound Length: (cm) 1 Stage:  Category/Stage III Width: (cm) 2.5 Depth: (cm) 0.6 Volume: (cm) 1.178 Character of Wound/Ulcer Post Requires Further Debridement Debridement: Post Procedure Diagnosis Same as Pre-procedure Electronic Signature(s) Signed: 05/19/2017 4:52:03 PM By: Alric Quan Signed: 05/22/2017 9:06:28 AM By: Worthy Keeler PA-C Entered By: Alric Quan on 05/19/2017 10:43:49 [name], [name] ([id number]) -------------------------------------------------------------------------------- HPI Details Patient Name: [name] Date of Service: 05/19/2017 9:45 AM Medical Record Number: [id number] Patient Account Number: [id number] Date of Birth/Sex: 12-27-1929 (84 y.o. Female) Treating RN: Carolyne Fiscal, Debi Primary Care Provider: Eliezer Lofts Other Clinician: Referring Provider: Eliezer Lofts Treating Provider/Extender: Melburn Hake, [place] Merida Weeks in Treatment: 1 History of Present Illness Location: right posterior heel Quality: intermittent burning and sharp pains Duration: pressure ulcer has been present for >6 weeks Context: recurrent pressure Modifying Factors: prolonged and unrelieved pressure delay wound healing HPI Description: 05/09/17-she is here for initial evaluation of a right posterior heel ulcer, accompanied by her daughter. She was hospitalized in November for right hip fracture and repair. 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 She states that during her hospitalization she developed multiple pressure ulcers, the only remaining is the posterior right heel. Home health has been following. She has applied a variety of ointments including Vaseline and Desitin to the right heel. She states that approximately 4 years ago she followed with Dr. Oneida Alar at [facility] secondary to claudication. She states there is "nothing they can do" and denies ever having intervention. She is a former smoker, quitting >30 years ago  She is a diabetic, her last A1c was 6.6 in December. ABIs in the office were unable to be obtained. There is soft lifted eschar to the ulcer which has been selectively debrided. I recommended a second opinion from vascular medicine, the would prefer to stay local, therefore a referral to [place] [facility] has been sent. We will apply Santyl daily; she has been advised to contact the clinic as soon as possible if she is unable  to afford the medication. She has been educated on the need for aggressive offloading to the right heel. 05/19/17 on evaluation today patient appears to be doing rather well in regard to her right posterior heel ulcer. She has been tolerating the dressing changes without complication. With that being said I am very pleased with how things have been going at this point in time from what I see at this point. Patient likewise is happy with the progress more specifically her daughter is to has been taking care of this ulcer in particular. She has been using Santyl currently with good result. Electronic Signature(s) Signed: 05/22/2017 9:06:28 AM By: Worthy Keeler PA-C Entered By: Worthy Keeler on 05/19/2017 18:09:21 [name] ([id number]) -------------------------------------------------------------------------------- Physical Exam Details Patient Name: [name], [name] Date of Service: 05/19/2017 9:45 AM Medical Record Number: [id number] Patient Account Number: [id number] Date of Birth/Sex: Dec 14, 1929 (84 y.o. Female) Treating RN: Carolyne Fiscal, Debi Primary Care Provider: Eliezer Lofts Other Clinician: Referring Provider: Eliezer Lofts Treating Provider/Extender: Melburn Hake, Leslieanne Cobarrubias Weeks in Treatment: 1 Constitutional Well-nourished and well-hydrated in no acute distress. Respiratory normal breathing without difficulty. Psychiatric this patient is able to make decisions and demonstrates good insight into disease process. Alert and Oriented x 3. pleasant and  cooperative. Notes Patient's wound did have slough noted at this point in time. This was sharply debrided and she did have some bleeding noted at this point in time. I was mainly trying to get away the port necrotic tissue and post debridement the wound did appear to be better and there definitely was some bleeding noted which is excellent news. With that being said I do believe the Santyl still have some work to do. As I explained patient's daughter I did not want to go to deeply mainly just trying to clean off the surface slough to allow the Santyl to work better. Electronic Signature(s) Signed: 05/22/2017 9:06:28 AM By: Worthy Keeler PA-C Entered By: Worthy Keeler on 05/19/2017 18:11:05 [name], [name] Kitchen ([id number]) -------------------------------------------------------------------------------- Physician Orders Details Patient Name: FELISIA, BALCOM [name]. Date of Service: 05/19/2017 9:45 AM Medical Record Number: [id number] Patient Account Number: [id number] Date of Birth/Sex: 1930-02-09 (84 y.o. Female) Treating RN: Carolyne Fiscal, Debi Primary Care Provider: Eliezer Lofts Other Clinician: Referring Provider: Eliezer Lofts Treating Provider/Extender: Melburn Hake, Dajane Valli Weeks in Treatment: 1 Verbal / Phone Orders: Yes Clinician: Carolyne Fiscal, Debi Read Back and Verified: Yes Diagnosis Coding ICD-10 Coding Code Description L89.610 Pressure ulcer of right heel, unstageable I70.234 Atherosclerosis of native arteries of right leg with ulceration of heel and midfoot I21.A9 Other myocardial infarction type R54 Age-related physical debility Wound Cleansing Wound #1 Right Calcaneus o Clean wound with Normal Saline. o Cleanse wound with mild soap and water o May Shower, gently pat wound dry prior to applying new dressing. Anesthetic (add to Medication List) Wound #1 Right Calcaneus o Topical Lidocaine 4% cream applied to wound bed prior to debridement (In Clinic Only). o Hurricaine Topical  Anesthetic Spray applied to wound bed prior to debridement (In Clinic Only). Primary Wound Dressing Wound #1 Right Calcaneus o Santyl Ointment Secondary Dressing Wound #1 Right Calcaneus o Conform/Kerlix o Non-adherent pad o Other - allevyn heel cup stretch netting #5 Dressing Change Frequency Wound #1 Right Calcaneus o Change dressing every day. Follow-up Appointments Wound #1 Right Calcaneus o Return Appointment in 1 week. Off-Loading Wound #1 Right Calcaneus o Turn and reposition every 2 hours o Other: - float heels while sitting or lying in the bed [name], [name] (  [id number]) Additional Orders / Instructions Wound #1 Right Calcaneus o Increase protein intake. o Other: - Vitamin C, Zinc Patient Medications Allergies: codeine, metformin, penicillin, pioglitazone, fenofibrate Notifications Medication Indication Start End lidocaine DOSE 1 - topical 4 % cream - 1 cream topical Electronic Signature(s) Signed: 05/19/2017 4:52:03 PM By: Alric Quan Signed: 05/22/2017 9:06:28 AM By: Worthy Keeler PA-C Entered By: Alric Quan on 05/19/2017 10:44:20 Taylor, [place] ([id number]) -------------------------------------------------------------------------------- Prescription 05/19/2017 Patient Name: [name] Provider: Worthy Keeler PA-C Date of Birth: 08/31/1929 NPI#: [id number] Sex: F DEA#: [id number] Phone #: [phone] License #: Patient Address: [facility] [address], [place], [place] [address] [place], [place] [address] [phone] Allergies codeine metformin penicillin pioglitazone fenofibrate Medication Medication: Route: Strength: Form: lidocaine 4 % topical cream topical 4% cream Class: TOPICAL LOCAL ANESTHETICS Dose: Frequency / Time: Indication: 1 1 cream topical Number of Refills: Number of Units: 0 Generic  Substitution: Start Date: End Date: One Time Use: Substitution Permitted No Note to Pharmacy: Signature(s): Date(s): JOSELYN, EDLING ([id number]) Electronic Signature(s) Signed: 05/19/2017 4:52:03 PM By: Alric Quan Signed: 05/22/2017 9:06:28 AM By: Worthy Keeler PA-C Entered By: Alric Quan on 05/19/2017 10:44:21 [place]ardot, Lenea [name]. ([id number]) --------------------------------------------------------------------------------  Problem List Details Patient Name: TOLLES, Houston [name]. Date of Service: 05/19/2017 9:45 AM Medical Record Number: [id number] Patient Account Number: [id number] Date of Birth/Sex: 1929-10-17 (84 y.o. Female) Treating RN: Carolyne Fiscal, Debi Primary Care Provider: Eliezer Lofts Other Clinician: Referring Provider: Eliezer Lofts Treating Provider/Extender: Melburn Hake, Aloysuis Ribaudo Weeks in Treatment: 1 Active Problems ICD-10 Encounter Code Description Active Date Diagnosis L89.610 Pressure ulcer of right heel, unstageable 05/09/2017 Yes I70.234 Atherosclerosis of native arteries of right leg with ulceration of heel 05/09/2017 Yes and midfoot I21.A9 Other myocardial infarction type 05/09/2017 Yes R54 Age-related physical debility 05/09/2017 Yes Inactive Problems Resolved Problems Electronic Signature(s) Signed: 05/22/2017 9:06:28 AM By: Worthy Keeler PA-C Entered By: Worthy Keeler on 05/19/2017 09:44:02 [place], Oceanna [name]. ([id number]) -------------------------------------------------------------------------------- Progress Note Details Patient Name: Galbreath, Amarii [name]. Date of Service: 05/19/2017 9:45 AM Medical Record Number: [id number] Patient Account Number: [id number] Date of Birth/Sex: Nov 18, 1929 (84 y.o. Female) Treating RN: Carolyne Fiscal, Debi Primary Care Provider: Eliezer Lofts Other Clinician: Referring Provider: Eliezer Lofts Treating Provider/Extender: Melburn Hake, Tearia Gibbs Weeks in Treatment: 1 Subjective Chief Complaint Information obtained from Patient she presents  for initial evaluation of a right posterior heel pressure ulcer History of Present Illness (HPI) The following HPI elements were documented for the patient's wound: Location: right posterior heel Quality: intermittent burning and sharp pains Duration: pressure ulcer has been present for >6 weeks Context: recurrent pressure Modifying Factors: prolonged and unrelieved pressure delay wound healing 05/09/17-she is here for initial evaluation of a right posterior heel ulcer, accompanied by her daughter. She was hospitalized in November for right hip fracture and repair. 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 She states that during her hospitalization she developed multiple pressure ulcers, the only remaining is the posterior right heel. Home health has been following. She has applied a variety of ointments including Vaseline and Desitin to the right heel. She states that approximately 4 years ago she followed with Dr. Oneida Alar at [facility] secondary to claudication. She states there is "nothing they can do" and denies ever having intervention. She is a former smoker, quitting >30 years ago She is a diabetic, her  last A1c was 6.6 in December. ABIs in the office were unable to be obtained. There is soft lifted eschar to the ulcer which has been selectively debrided. I recommended a second opinion from vascular medicine, the would prefer to stay local, therefore a referral to [facility] has been sent. We will apply Santyl daily; she has been advised to contact the clinic as soon as possible if she is unable to afford the medication. She has been educated on the need for aggressive offloading to the right heel. 05/19/17 on evaluation today patient appears to be doing rather well in regard to her right posterior heel ulcer. She has been tolerating the dressing changes without complication. With that being said I am  very pleased with how things have been going at this point in time from what I see at this point. Patient likewise is happy with the progress more specifically her daughter is to has been taking care of this ulcer in particular. She has been using Santyl currently with good result. Patient History Information obtained from Patient. Family History Cancer - Mother,Child, Diabetes - Child, Hypertension - Child, Thyroid Problems - Child, No family history of Heart Disease, Hereditary Spherocytosis, Kidney Disease, Lung Disease, Seizures, Stroke, Tuberculosis. Social History Former smoker - quit 50 yrs ago, Marital Status - Widowed, Alcohol Use - Never, Drug Use - No History, Caffeine Use - Daily. Review of Systems (ROS) Constitutional Symptoms (General Health) Denies complaints or symptoms of Fever, Chills. Respiratory The patient has no complaints or symptoms. [name], [name] ([id number]) Cardiovascular The patient has no complaints or symptoms. Psychiatric The patient has no complaints or symptoms. Objective Constitutional Well-nourished and well-hydrated in no acute distress. Vitals Time Taken: 9:59 AM, Height: 63 in, Temperature: 97.7 F, Pulse: 84 bpm, Respiratory Rate: 16 breaths/min, Blood Pressure: 116/58 mmHg. Respiratory normal breathing without difficulty. Psychiatric this patient is able to make decisions and demonstrates good insight into disease process. Alert and Oriented x 3. pleasant and cooperative. General Notes: Patient's wound did have slough noted at this point in time. This was sharply debrided and she did have some bleeding noted at this point in time. I was mainly trying to get away the port necrotic tissue and post debridement the wound did appear to be better and there definitely was some bleeding noted which is excellent news. With that being said I do believe the Santyl still have some work to do. As I explained patient's daughter I did not want to go to  deeply mainly just trying to clean off the surface slough to allow the Santyl to work better. Integumentary (Hair, Skin) Wound #1 status is Open. Original cause of wound was Pressure Injury. The wound is located on the Right Calcaneus. The wound measures 1cm length x 2.5cm width x 0.5cm depth; 1.963cm^2 area and 0.982cm^3 volume. There is no tunneling or undermining noted. There is a large amount of serous drainage noted. The wound margin is distinct with the outline attached to the wound base. There is small (1-33%) granulation within the wound bed. There is a large (67-100%) amount of necrotic tissue within the wound bed including Adherent Slough. The periwound skin appearance exhibited: Maceration. Periwound temperature was noted as No Abnormality. The periwound has tenderness on palpation. Assessment Active Problems ICD-10 L89.610 - Pressure ulcer of right heel, unstageable I70.234 - Atherosclerosis of native arteries of right leg with ulceration of heel and midfoot I21.A9 - Other myocardial infarction type R54 - Age-related physical debility [name], [name]  [name]. ([id number]) Procedures Wound #1 Pre-procedure diagnosis of Wound #1 is a Pressure Ulcer located on the Right Calcaneus . There was a Skin/Subcutaneous Tissue Debridement ([id number]) debridement with total area of 2.5 sq cm performed by [name] III, Ethen Bannan [name]., PA-C. with the following instrument(s): Curette to remove Viable and Non-Viable tissue/material including Exudate, Fibrin/Slough, and Subcutaneous after achieving pain control using Lidocaine 4% Topical Solution. A time out was conducted at 10:39, prior to the start of the procedure. A Minimum amount of bleeding was controlled with Pressure. The procedure was tolerated well with a pain level of 0 throughout and a pain level of 0 following the procedure. Post Debridement Measurements: 1cm length x 2.5cm width x 0.6cm depth; 1.178cm^3 volume. Post debridement Stage noted as  Category/Stage III. Character of Wound/Ulcer Post Debridement requires further debridement. Post procedure Diagnosis Wound #1: Same as Pre-Procedure Plan Wound Cleansing: Wound #1 Right Calcaneus: Clean wound with Normal Saline. Cleanse wound with mild soap and water May Shower, gently pat wound dry prior to applying new dressing. Anesthetic (add to Medication List): Wound #1 Right Calcaneus: Topical Lidocaine 4% cream applied to wound bed prior to debridement (In Clinic Only). Hurricaine Topical Anesthetic Spray applied to wound bed prior to debridement (In Clinic Only). Primary Wound Dressing: Wound #1 Right Calcaneus: Santyl Ointment Secondary Dressing: Wound #1 Right Calcaneus: Conform/Kerlix Non-adherent pad Other - allevyn heel cup stretch netting #5 Dressing Change Frequency: Wound #1 Right Calcaneus: Change dressing every day. Follow-up Appointments: Wound #1 Right Calcaneus: Return Appointment in 1 week. Off-Loading: Wound #1 Right Calcaneus: Turn and reposition every 2 hours Other: - float heels while sitting or lying in the bed Additional Orders / Instructions: Wound #1 Right Calcaneus: Increase protein intake. Other: - Vitamin C, Zinc The following medication(s) was prescribed: lidocaine topical 4 % cream 1 1 cream topical was prescribed at facility [name], RADLOFF. ([id number]) At this point I'm gonna recommend that we continue with the [facility] we will see were things stand in one weeks time. She is in agreement with this plan. Hopefully her heel will continue to show signs of good improvement over the next several weeks. We will see what [facility] has to say as well as far as the second opinion regarding her vascular status. Please see above for specific wound care orders. We will see patient for re-evaluation in 1 week(s) here in the clinic. If anything worsens or changes patient will contact our office for additional recommendations. Electronic  Signature(s) Signed: 05/22/2017 9:06:28 AM By: Worthy Keeler PA-C Entered By: Worthy Keeler on 05/19/2017 18:12:25 [name], [name] Kitchen ([id number]) -------------------------------------------------------------------------------- ROS/PFSH Details Patient Name: [name], [name] Date of Service: 05/19/2017 9:45 AM Medical Record Number: [id number] Patient Account Number: [id number] Date of Birth/Sex: 11/11/1929 (84 y.o. Female) Treating RN: Carolyne Fiscal, Debi Primary Care Provider: Eliezer Lofts Other Clinician: Referring Provider: Eliezer Lofts Treating Provider/Extender: Melburn Hake, Mulki Roesler Weeks in Treatment: 1 Information Obtained From Patient Wound History Do you currently have one or more open woundso Yes How many open wounds do you currently haveo 2 Approximately how long have you had your woundso Mar 11 2017 How have you been treating your wound(s) until nowo desitin Has your wound(s) ever healed and then re-openedo No Have you had any lab work done in the past montho No Have you tested positive for an antibiotic resistant organism (MRSA, VRE)o No Have you tested positive for osteomyelitis (bone infection)o No Have you had any tests for circulation on your legso  Yes Who ordered the testo 2014 Have you had other problems associated with your woundso Swelling Constitutional Symptoms (General Health) Complaints and Symptoms: Negative for: Fever; Chills Eyes Medical History: Positive for: Cataracts - surgery Hematologic/Lymphatic Medical History: Positive for: Anemia Respiratory Complaints and Symptoms: No Complaints or Symptoms Cardiovascular Complaints and Symptoms: No Complaints or Symptoms Medical History: Positive for: Congestive Heart Failure; Myocardial Infarction Endocrine Medical History: Positive for: Type II Diabetes Time with diabetes: 64 yrs [name], [name] ([id number]) Treated with: Insulin Blood sugar tested every day: Yes Tested : Musculoskeletal Medical  History: Positive for: Osteoarthritis Neurologic Medical History: Positive for: Neuropathy Psychiatric Complaints and Symptoms: No Complaints or Symptoms HBO Extended History Items Eyes: Cataracts Immunizations Pneumococcal Vaccine: Received Pneumococcal Vaccination: Yes Implantable Devices Family and Social History Cancer: Yes - Mother,Child; Diabetes: Yes - Child; Heart Disease: No; Hereditary Spherocytosis: No; Hypertension: Yes - Child; Kidney Disease: No; Lung Disease: No; Seizures: No; Stroke: No; Thyroid Problems: Yes - Child; Tuberculosis: No; Former smoker - quit 50 yrs ago; Marital Status - Widowed; Alcohol Use: Never; Drug Use: No History; Caffeine Use: Daily; Financial Concerns: No; Food, Clothing or Shelter Needs: No; Support System Lacking: No; Transportation Concerns: No; Advanced Directives: No; Patient does not want information on Advanced Directives; Do not resuscitate: No; Living Will: Yes (Not Provided); Medical Power of Attorney: No Physician Affirmation I have reviewed and agree with the above information. Electronic Signature(s) Signed: 05/22/2017 9:06:28 AM By: Worthy Keeler PA-C Signed: 05/23/2017 4:46:02 PM By: Alric Quan Entered By: Worthy Keeler on 05/19/2017 18:09:45 [name], [name]. ([id number]) -------------------------------------------------------------------------------- SuperBill Details Patient Name: PANGILINAN, Lavell [name]. Date of Service: 05/19/2017 Medical Record Number: [id number] Patient Account Number: [id number] Date of Birth/Sex: 07-25-1929 (84 y.o. Female) Treating RN: Carolyne Fiscal, Debi Primary Care Provider: Eliezer Lofts Other Clinician: Referring Provider: Eliezer Lofts Treating Provider/Extender: Melburn Hake, Arisha Gervais Weeks in Treatment: 1 Diagnosis Coding ICD-10 Codes Code Description L89.610 Pressure ulcer of right heel, unstageable I70.234 Atherosclerosis of native arteries of right leg with ulceration of heel and midfoot I21.A9  Other myocardial infarction type R54 Age-related physical debility Facility Procedures CPT4 Code: [id number] Description: 12458 - DEB SUBQ TISSUE 20 SQ CM/< ICD-10 Diagnosis Description L89.610 Pressure ulcer of right heel, unstageable Modifier: Quantity: 1 Physician Procedures CPT4 Code: [id number] Description: 25053 - WC PHYS SUBQ TISS 20 SQ CM ICD-10 Diagnosis Description L89.610 Pressure ulcer of right heel, unstageable Modifier: Quantity: 1 Electronic Signature(s) Signed: 05/22/2017 9:06:28 AM By: Worthy Keeler PA-C Entered By: Worthy Keeler on 05/19/2017 18:15:00

## 2017-05-26 ENCOUNTER — Encounter: Payer: PPO | Admitting: Physician Assistant

## 2017-05-26 DIAGNOSIS — L89619 Pressure ulcer of right heel, unspecified stage: Secondary | ICD-10-CM | POA: Diagnosis not present

## 2017-05-26 DIAGNOSIS — L89613 Pressure ulcer of right heel, stage 3: Secondary | ICD-10-CM | POA: Diagnosis not present

## 2017-05-29 NOTE — Progress Notes (Signed)
[name] ([id number]) Visit Report for 05/26/2017 Arrival Information Details Patient Name: [name], [name]. Date of Service: 05/26/2017 3:15 PM Medical Record Number: [id number] Patient Account Number: [id number] Date of Birth/Sex: 1929/10/23 (84 y.o. Female) Treating RN: Carolyne Fiscal, Debi Primary Care Khristy Kalan: Eliezer Lofts Other Clinician: Referring Jerrik Housholder: Eliezer Lofts Treating Lasonia Casino/Extender: Melburn Hake, HOYT Weeks in Treatment: 2 Visit Information History Since Last Visit All ordered tests and consults were completed: No Patient Arrived: Wheel Chair Added or deleted any medications: No Arrival Time: 15:32 Any new allergies or adverse reactions: No Accompanied By: daughter Had a fall or experienced change in No Transfer Assistance: EasyPivot Patient activities of daily living that may affect Lift risk of falls: Patient Identification Verified: Yes Signs or symptoms of abuse/neglect since last visito No Secondary Verification Process Yes Hospitalized since last visit: No Completed: Has Dressing in Place as Prescribed: Yes Patient Requires Transmission-Based No Precautions: Pain Present Now: Yes Patient Has Alerts: Yes Patient Alerts: DM II unable to get ABI inaudib Electronic Signature(s) Signed: 05/26/2017 4:55:35 PM By: Alric Quan Entered By: Alric Quan on 05/26/2017 15:34:00 [name], [name] ([id number]) -------------------------------------------------------------------------------- Clinic Level of Care Assessment Details Patient Name: [name] Date of Service: 05/26/2017 3:15 PM Medical Record Number: [id number] Patient Account Number: [id number] Date of Birth/Sex: [birth year] (84 y.o. Female) Treating RN: Carolyne Fiscal, Debi Primary Care Dahiana Kulak: Eliezer Lofts Other Clinician: Referring Jimmie Dattilio: Eliezer Lofts Treating Kariss Longmire/Extender: Melburn Hake, HOYT Weeks in Treatment: 2 Clinic Level of Care Assessment Items TOOL 4 Quantity Score X -  Use when only an EandM is performed on FOLLOW-UP visit 1 0 ASSESSMENTS - Nursing Assessment / Reassessment X - Reassessment of Co-morbidities (includes updates in patient status) 1 10 X- 1 5 Reassessment of Adherence to Treatment Plan ASSESSMENTS - Wound and Skin Assessment / Reassessment X - Simple Wound Assessment / Reassessment - one wound 1 5 []  - 0 Complex Wound Assessment / Reassessment - multiple wounds []  - 0 Dermatologic / Skin Assessment (not related to wound area) ASSESSMENTS - Focused Assessment []  - Circumferential Edema Measurements - multi extremities 0 []  - 0 Nutritional Assessment / Counseling / Intervention []  - 0 Lower Extremity Assessment (monofilament, tuning fork, pulses) []  - 0 Peripheral Arterial Disease Assessment (using hand held doppler) ASSESSMENTS - Ostomy and/or Continence Assessment and Care []  - Incontinence Assessment and Management 0 []  - 0 Ostomy Care Assessment and Management (repouching, etc.) PROCESS - Coordination of Care X - Simple Patient / Family Education for ongoing care 1 15 []  - 0 Complex (extensive) Patient / Family Education for ongoing care []  - 0 Staff obtains Programmer, systems, Records, Test Results / Process Orders []  - 0 Staff telephones HHA, Nursing Homes / Clarify orders / etc []  - 0 Routine Transfer to another Facility (non-emergent condition) []  - 0 Routine Hospital Admission (non-emergent condition) []  - 0 New Admissions / Biomedical engineer / Ordering NPWT, Apligraf, etc. []  - 0 Emergency Hospital Admission (emergent condition) X- 1 10 Simple Discharge Coordination [name] ([id number]) []  - 0 Complex (extensive) Discharge Coordination PROCESS - Special Needs []  - Pediatric / Minor Patient Management 0 []  - 0 Isolation Patient Management []  - 0 Hearing / Language / Visual special needs []  - 0 Assessment of Community assistance (transportation, D/C planning, etc.) []  - 0 Additional assistance / Altered  mentation []  - 0 Support Surface(s) Assessment (bed, cushion, seat, etc.) INTERVENTIONS - Wound Cleansing / Measurement X - Simple Wound Cleansing - one wound 1 5 []  - 0  Complex Wound Cleansing - multiple wounds X- 1 5 Wound Imaging (photographs - any number of wounds) []  - 0 Wound Tracing (instead of photographs) X- 1 5 Simple Wound Measurement - one wound []  - 0 Complex Wound Measurement - multiple wounds INTERVENTIONS - Wound Dressings []  - Small Wound Dressing one or multiple wounds 0 X- 1 15 Medium Wound Dressing one or multiple wounds []  - 0 Large Wound Dressing one or multiple wounds X- 1 5 Application of Medications - topical []  - 0 Application of Medications - injection INTERVENTIONS - Miscellaneous []  - External ear exam 0 []  - 0 Specimen Collection (cultures, biopsies, blood, body fluids, etc.) []  - 0 Specimen(s) / Culture(s) sent or taken to Lab for analysis []  - 0 Patient Transfer (multiple staff / Civil Service fast streamer / Similar devices) []  - 0 Simple Staple / Suture removal (25 or less) []  - 0 Complex Staple / Suture removal (26 or more) []  - 0 Hypo / Hyperglycemic Management (close monitor of Blood Glucose) []  - 0 Ankle / Brachial Index (ABI) - do not check if billed separately X- 1 5 Vital Signs [name] ([id number]) Has the patient been seen at the hospital within the last three years: Yes Total Score: 85 Level Of Care: New/Established - Level 3 Electronic Signature(s) Signed: 05/26/2017 4:55:35 PM By: Alric Quan Entered By: Alric Quan on 05/26/2017 16:19:07 [name] ([id number]) -------------------------------------------------------------------------------- Encounter Discharge Information Details Patient Name: [name] Date of Service: 05/26/2017 3:15 PM Medical Record Number: [id number] Patient Account Number: [id number] Date of Birth/Sex: May 16, 1929 (84 y.o. Female) Treating RN: Carolyne Fiscal, Debi Primary Care Kenon Delashmit:  Eliezer Lofts Other Clinician: Referring Trelyn Vanderlinde: Eliezer Lofts Treating Aadhira Heffernan/Extender: Melburn Hake, HOYT Weeks in Treatment: 2 Encounter Discharge Information Items Discharge Pain Level: 0 Discharge Condition: Stable Ambulatory Status: Wheelchair Discharge Destination: Home Transportation: Private Auto Accompanied By: daughter Schedule Follow-up Appointment: Yes Medication Reconciliation completed and No provided to Patient/Care Lillionna Nabi: Provided on Clinical Summary of Care: 05/26/2017 Form Type Recipient Paper Patient EG Electronic Signature(s) Signed: 05/26/2017 4:08:58 PM By: Alric Quan Entered By: Alric Quan on 05/26/2017 16:08:57 [place], [place]. ([id number]) -------------------------------------------------------------------------------- Lower Extremity Assessment Details Patient Name: [name] Date of Service: 05/26/2017 3:15 PM Medical Record Number: [id number] Patient Account Number: [id number] Date of Birth/Sex: [birth year] (84 y.o. Female) Treating RN: Carolyne Fiscal, Debi Primary Care Kori Goins: Eliezer Lofts Other Clinician: Referring Lealon Vanputten: Eliezer Lofts Treating Kenshawn Maciolek/Extender: Melburn Hake, HOYT Weeks in Treatment: 2 Vascular Assessment Pulses: Dorsalis Pedis Palpable: [Right:No] Doppler Audible: [Right:Yes] Posterior Tibial Extremity colors, hair growth, and conditions: Extremity Color: [Right:Normal] Temperature of Extremity: [Right:Cool] Capillary Refill: [Right:> 3 seconds] Electronic Signature(s) Signed: 05/26/2017 4:55:35 PM By: Alric Quan Entered By: Alric Quan on 05/26/2017 15:44:42 Proffit, Sanayah [name]. ([id number]) -------------------------------------------------------------------------------- Multi Wound Chart Details Patient Name: [name] Date of Service: 05/26/2017 3:15 PM Medical Record Number: [id number] Patient Account Number: [id number] Date of Birth/Sex: [birth year] (84 y.o. Female) Treating RN:  Carolyne Fiscal, Debi Primary Care Huda Petrey: Eliezer Lofts Other Clinician: Referring Marlinda Miranda: Eliezer Lofts Treating Irena Gaydos/Extender: Melburn Hake, HOYT Weeks in Treatment: 2 Vital Signs Height(in): 25 Pulse(bpm): 16 Weight(lbs): Blood Pressure(mmHg): 114/52 Body Mass Index(BMI): Temperature(F): 98.4 Respiratory Rate 16 (breaths/min): Photos: [1:No Photos] [N/A:N/A] Wound Location: [1:Right Calcaneus] [N/A:N/A] Wounding Event: [1:Pressure Injury] [N/A:N/A] Primary Etiology: [1:Pressure Ulcer] [N/A:N/A] Comorbid History: [1:Cataracts, Anemia, Congestive Heart Failure, Myocardial Infarction, Type II Diabetes, Osteoarthritis, Neuropathy] [N/A:N/A] Date Acquired: [1:03/11/2017] [N/A:N/A] Weeks of Treatment: [1:2] [N/A:N/A] Wound Status: [1:Open] [N/A:N/A] Measurements L x W x D [1:0.9x2.5x0.5] [  N/A:N/A] (cm) Area (cm) : [1:1.767] [N/A:N/A] Volume (cm) : [1:0.884] [N/A:N/A] % Reduction in Area: [1:42.30%] [N/A:N/A] % Reduction in Volume: [1:3.80%] [N/A:N/A] Classification: [1:Category/Stage III] [N/A:N/A] Exudate Amount: [1:Large] [N/A:N/A] Exudate Type: [1:Serous] [N/A:N/A] Exudate Color: [1:amber] [N/A:N/A] Wound Margin: [1:Distinct, outline attached] [N/A:N/A] Granulation Amount: [1:Small (1-33%)] [N/A:N/A] Necrotic Amount: [1:Large (67-100%)] [N/A:N/A] Exposed Structures: [1:Fascia: No Fat Layer (Subcutaneous Tissue) Exposed: No Tendon: No Muscle: No Joint: No Bone: No] [N/A:N/A] Epithelialization: [1:None] [N/A:N/A] Periwound Skin Texture: [1:No Abnormalities Noted] [N/A:N/A] Periwound Skin Moisture: [1:Maceration: Yes] [N/A:N/A] Periwound Skin Color: [1:No Abnormalities Noted] [N/A:N/A] Temperature: [1:No Abnormality] [N/A:N/A] Tenderness on Palpation: Yes N/A N/A Wound Preparation: Ulcer Cleansing: N/A N/A Rinsed/Irrigated with Saline Topical Anesthetic Applied: Other: lidocaine 4% Treatment Notes Electronic Signature(s) Signed: 05/26/2017 4:55:35 PM By: Alric Quan Entered By: Alric Quan on 05/26/2017 15:44:53 [place], [name] ([id number]) -------------------------------------------------------------------------------- [place] Details Patient Name: AKYRA, BOUCHIE [name]. Date of Service: 05/26/2017 3:15 PM Medical Record Number: [id number] Patient Account Number: [id number] Date of Birth/Sex: May 18, 1929 (84 y.o. Female) Treating RN: Carolyne Fiscal, Debi Primary Care Raymond Azure: Eliezer Lofts Other Clinician: Referring Irvin Lizama: Eliezer Lofts Treating Farra Nikolic/Extender: Melburn Hake, HOYT Weeks in Treatment: 2 Active Inactive ` Abuse / Safety / Falls / Self Care Management Nursing Diagnoses: History of Falls Potential for falls Goals: Patient will not experience any injury related to falls Date Initiated: 05/09/2017 Target Resolution Date: 08/19/2017 Goal Status: Active Interventions: Assess fall risk on admission and as needed Assess: immobility, friction, shearing, incontinence upon admission and as needed Assess impairment of mobility on admission and as needed per policy Assess personal safety and home safety (as indicated) on admission and as needed Assess self care needs on admission and as needed Provide education on basic hygiene Provide education on fall prevention Treatment Activities: Education provided on Basic Hygiene : 05/09/2017 Notes: ` Nutrition Nursing Diagnoses: Imbalanced nutrition Impaired glucose control: actual or potential Potential for alteratiion in Nutrition/Potential for imbalanced nutrition Goals: Patient/caregiver agrees to and verbalizes understanding of need to use nutritional supplements and/or vitamins as prescribed Date Initiated: 05/09/2017 Target Resolution Date: 08/19/2017 Goal Status: Active Patient/caregiver will maintain therapeutic glucose control Date Initiated: 05/09/2017 Target Resolution Date: 08/19/2017 Goal Status: Active LEONI, GOODNESS ([id number]) Interventions: Assess  patient nutrition upon admission and as needed per policy Provide education on elevated blood sugars and impact on wound healing Provide education on nutrition Treatment Activities: Education provided on Nutrition : 05/09/2017 Notes: ` Orientation to the Wound Care Program Nursing Diagnoses: Knowledge deficit related to the wound healing center program Goals: Patient/caregiver will verbalize understanding of the [place] Date Initiated: 05/09/2017 Target Resolution Date: 05/20/2017 Goal Status: Active Interventions: Provide education on orientation to the wound center Notes: ` Pain, Acute or Chronic Nursing Diagnoses: Pain, acute or chronic: actual or potential Potential alteration in comfort, pain Goals: Patient/caregiver will verbalize adequate pain control between visits Date Initiated: 05/09/2017 Target Resolution Date: 08/19/2017 Goal Status: Active Interventions: Complete pain assessment as per visit requirements Encourage patient to take pain medications as prescribed Implement pain control techniques (non-pharmaceutical) Notes: ` Pressure Nursing Diagnoses: Knowledge deficit related to causes and risk factors for pressure ulcer development Knowledge deficit related to management of pressures ulcers Potential for impaired tissue integrity related to pressure, friction, moisture, and shear [name] ([id number]) Goals: Patient will remain free from development of additional pressure ulcers Date Initiated: 05/09/2017 Target Resolution Date: 08/19/2017 Goal Status: Active Interventions: Assess: immobility, friction, shearing, incontinence upon admission and as needed Assess offloading mechanisms  upon admission and as needed Assess potential for pressure ulcer upon admission and as needed Provide education on pressure ulcers Notes: ` Wound/Skin Impairment Nursing Diagnoses: Impaired tissue integrity Knowledge deficit related to  ulceration/compromised skin integrity Goals: Ulcer/skin breakdown will have a volume reduction of 80% by week 12 Date Initiated: 05/09/2017 Target Resolution Date: 08/19/2017 Goal Status: Active Interventions: Assess patient/caregiver ability to perform ulcer/skin care regimen upon admission and as needed Assess ulceration(s) every visit Notes: Electronic Signature(s) Signed: 05/26/2017 4:55:35 PM By: Alric Quan Entered By: Alric Quan on 05/26/2017 15:44:46 [place], [place]. ([id number]) -------------------------------------------------------------------------------- Pain Assessment Details Patient Name: [name] Date of Service: 05/26/2017 3:15 PM Medical Record Number: [id number] Patient Account Number: [id number] Date of Birth/Sex: 10-05-1929 (84 y.o. Female) Treating RN: Carolyne Fiscal, Debi Primary Care Brexton Sofia: Eliezer Lofts Other Clinician: Referring Mikhai Bienvenue: Eliezer Lofts Treating Jaleah Lefevre/Extender: Melburn Hake, HOYT Weeks in Treatment: 2 Active Problems Location of Pain Severity and Description of Pain Patient Has Paino Yes Site Locations Rate the pain. Current Pain Level: 4 Character of Pain Describe the Pain: Shooting Pain Management and Medication Current Pain Management: Notes Topical or injectable lidocaine is offered to patient for acute pain when surgical debridement is performed. If needed, Patient is instructed to use over the counter pain medication for the following 24-48 hours after debridement. Wound care MDs do not prescribed pain medications. Patient has chronic pain or uncontrolled pain. Patient has been instructed to make an appointment with their Primary Care Physician for pain management. Electronic Signature(s) Signed: 05/26/2017 4:55:35 PM By: Alric Quan Entered By: Alric Quan on 05/26/2017 15:34:25 [name] Kitchen ([id number]) -------------------------------------------------------------------------------- Patient/Caregiver  Education Details Patient Name: [name] Date of Service: 05/26/2017 3:15 PM Medical Record Number: [id number] Patient Account Number: [id number] Date of Birth/Gender: 1929/04/29 (84 y.o. Female) Treating RN: Carolyne Fiscal, Debi Primary Care Physician: Eliezer Lofts Other Clinician: Referring Physician: Eliezer Lofts Treating Physician/Extender: Sharalyn Ink in Treatment: 2 Education Assessment Education Provided To: Patient Education Topics Provided Wound/Skin Impairment: Handouts: Caring for Your Ulcer, Other: change dressing as ordered Methods: Demonstration, Explain/Verbal Responses: State content correctly Electronic Signature(s) Signed: 05/26/2017 4:55:35 PM By: Alric Quan Entered By: Alric Quan on 05/26/2017 16:09:12 [place], [place]. ([id number]) -------------------------------------------------------------------------------- Wound Assessment Details Patient Name: [name] Date of Service: 05/26/2017 3:15 PM Medical Record Number: [id number] Patient Account Number: [id number] Date of Birth/Sex: 08-Oct-1929 (84 y.o. Female) Treating RN: Carolyne Fiscal, Debi Primary Care Aman Batley: Eliezer Lofts Other Clinician: Referring Vashawn Ekstein: Eliezer Lofts Treating Reda Gettis/Extender: Melburn Hake, HOYT Weeks in Treatment: 2 Wound Status Wound Number: 1 Primary Pressure Ulcer Etiology: Wound Location: Right Calcaneus Wound Open Wounding Event: Pressure Injury Status: Date Acquired: 03/11/2017 Comorbid Cataracts, Anemia, Congestive Heart Failure, Weeks Of Treatment: 2 History: Myocardial Infarction, Type II Diabetes, Clustered Wound: No Osteoarthritis, Neuropathy Photos Photo Uploaded By: Alric Quan on 05/26/2017 16:21:02 Wound Measurements Length: (cm) 0.9 Width: (cm) 2.5 Depth: (cm) 0.5 Area: (cm) 1.767 Volume: (cm) 0.884 % Reduction in Area: 42.3% % Reduction in Volume: 3.8% Epithelialization: None Tunneling: No Undermining: No Wound  Description Classification: Category/Stage III Wound Margin: Distinct, outline attached Exudate Amount: Large Exudate Type: Serous Exudate Color: amber Slough/Fibrino Yes Wound Bed Granulation Amount: Small (1-33%) Exposed Structure Necrotic Amount: Large (67-100%) Fascia Exposed: No Necrotic Quality: Adherent Slough Fat Layer (Subcutaneous Tissue) Exposed: No Tendon Exposed: No Muscle Exposed: No Joint Exposed: No Bone Exposed: No Periwound Skin Texture Masullo, Emryn [name]. ([id number]) Texture Color No Abnormalities Noted: No No Abnormalities Noted: No Moisture Temperature / Pain  No Abnormalities Noted: No Temperature: No Abnormality Maceration: Yes Tenderness on Palpation: Yes Wound Preparation Ulcer Cleansing: Rinsed/Irrigated with Saline Topical Anesthetic Applied: Other: lidocaine 4%, Treatment Notes Wound #1 (Right Calcaneus) 1. Cleansed with: Clean wound with Normal Saline 2. Anesthetic Topical Lidocaine 4% cream to wound bed prior to debridement 4. Dressing Applied: Santyl Ointment 5. Secondary Dressing Applied Kerlix/Conform Non-Adherent pad 7. Secured with Tape Notes allevyn heel cup, netting Electronic Signature(s) Signed: 05/26/2017 4:55:35 PM By: Alric Quan Entered By: Alric Quan on 05/26/2017 15:44:11 [place], Lujuana [name]. ([id number]) -------------------------------------------------------------------------------- Vitals Details Patient Name: [name], Kerrie [name]. Date of Service: 05/26/2017 3:15 PM Medical Record Number: [id number] Patient Account Number: [id number] Date of Birth/Sex: [birth year] (84 y.o. Female) Treating RN: Carolyne Fiscal, Debi Primary Care Chalsey Leeth: Eliezer Lofts Other Clinician: Referring Noriel Guthrie: Eliezer Lofts Treating Dutch Ing/Extender: Melburn Hake, HOYT Weeks in Treatment: 2 Vital Signs Time Taken: 15:35 Temperature (F): 98.4 Height (in): 63 Pulse (bpm): 79 Respiratory Rate (breaths/min): 16 Blood Pressure (mmHg):  114/52 Reference Range: 80 - 120 mg / dl Electronic Signature(s) Signed: 05/26/2017 4:55:35 PM By: Alric Quan Entered By: Alric Quan on 05/26/2017 15:36:28

## 2017-05-29 NOTE — Progress Notes (Signed)
OCIE, STANZIONE ([id number]) Visit Report for 05/26/2017 Chief Complaint Document Details Patient Name: [name], [name] Date of Service: 05/26/2017 3:15 PM Medical Record Number: [id number] Patient Account Number: [id number] Date of Birth/Sex: 1929-11-15 (84 y.o. Female) Treating RN: Carolyne Fiscal, Debi Primary Care Provider: Eliezer Lofts Other Clinician: Referring Provider: Eliezer Lofts Treating Provider/Extender: Melburn Hake, Vicci Reder Weeks in Treatment: 2 Information Obtained from: Patient Chief Complaint she presents for initial evaluation of a right posterior heel pressure ulcer Electronic Signature(s) Signed: 05/29/2017 8:04:39 AM By: Worthy Keeler PA-C Entered By: Worthy Keeler on 05/26/2017 15:46:22 [place], [place]. ([id number]) -------------------------------------------------------------------------------- HPI Details Patient Name: [name] Date of Service: 05/26/2017 3:15 PM Medical Record Number: [id number] Patient Account Number: [id number] Date of Birth/Sex: 1929/12/23 (84 y.o. Female) Treating RN: Carolyne Fiscal, Debi Primary Care Provider: Eliezer Lofts Other Clinician: Referring Provider: Eliezer Lofts Treating Provider/Extender: Melburn Hake, Anden Bartolo Weeks in Treatment: 2 History of Present Illness Location: right posterior heel Quality: intermittent burning and sharp pains Duration: pressure ulcer has been present for >6 weeks Context: recurrent pressure Modifying Factors: prolonged and unrelieved pressure delay wound healing HPI Description: 05/09/17-she is here for initial evaluation of a right posterior heel ulcer, accompanied by her daughter. She was hospitalized in November for right hip fracture and repair. 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 She states that during her hospitalization she developed multiple pressure ulcers, the only remaining is the posterior right heel. Home health has been following. She  has applied a variety of ointments including Vaseline and Desitin to the right heel. She states that approximately 4 years ago she followed with Dr. Oneida Alar at [facility] secondary to claudication. She states there is "nothing they can do" and denies ever having intervention. She is a former smoker, quitting >30 years ago She is a diabetic, her last A1c was 6.6 in December. ABIs in the office were unable to be obtained. There is soft lifted eschar to the ulcer which has been selectively debrided. I recommended a second opinion from vascular medicine, the would prefer to stay local, therefore a referral to [place] [facility] has been sent. We will apply Santyl daily; she has been advised to contact the clinic as soon as possible if she is unable to afford the medication. She has been educated on the need for aggressive offloading to the right heel. 05/19/17 on evaluation today patient appears to be doing rather well in regard to her right posterior heel ulcer. She has been tolerating the dressing changes without complication. With that being said I am very pleased with how things have been going at this point in time from what I see at this point. Patient likewise is happy with the progress more specifically her daughter is to has been taking care of this ulcer in particular. She has been using Santyl currently with good result. 05/26/17 on evaluation today patient appears to be doing about the same in regard to her ulcer at this point. She has been tolerating the dressing changes. We have been using Santyl to help clean up the ulcer bed. She does have her appointment with [facility] on 05/30/17. Hopefully they will be able to determine her blood flow and whether any intervention may be necessary or not. With that being said she fortunately is doing well not having significant pain at this time. Electronic Signature(s) Signed: 05/29/2017 8:04:39 AM By: Worthy Keeler PA-C Entered By:  Worthy Keeler on 05/26/2017 16:31:05 [name], [name] ([id number]) -------------------------------------------------------------------------------- Physical Exam Details Patient Name: [name], [name] Date of Service: 05/26/2017 3:15 PM Medical Record Number: [id number] Patient Account Number: [id number] Date of Birth/Sex: 12/09/1929 (84 y.o. Female) Treating RN: Carolyne Fiscal, Debi Primary Care Provider: Eliezer Lofts Other Clinician: Referring Provider: Eliezer Lofts Treating Provider/Extender: Melburn Hake, Yeni Jiggetts Weeks in Treatment: 2 Constitutional Well-nourished and well-hydrated in no acute distress. Respiratory normal breathing without difficulty. clear to auscultation bilaterally. Cardiovascular regular rate and rhythm with normal S1, S2. Psychiatric this patient is able to make decisions and demonstrates good insight into disease process. Alert and Oriented x 3. pleasant and cooperative. Notes Patient's wound bed still continues to be mostly slough covered although it does seem to be loosening up a little bit. No sharp debridement was performed today as I am unsure of the sufficiency of her blood flow and would prefer to put off any further debridement until vascular examination. Electronic Signature(s) Signed: 05/29/2017 8:04:39 AM By: Worthy Keeler PA-C Entered By: Worthy Keeler on 05/26/2017 16:31:47 [name], [name] Kitchen ([id number]) -------------------------------------------------------------------------------- Physician Orders Details Patient Name: [name], [name] Date of Service: 05/26/2017 3:15 PM Medical Record Number: [id number] Patient Account Number: [id number] Date of Birth/Sex: 31-Oct-1929 (84 y.o. 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 o Cleanse wound with mild soap and water o May Shower, gently pat wound dry prior to applying new dressing. Anesthetic (add to Medication List) Wound #1 Right Calcaneus o Topical Lidocaine 4% cream applied to wound bed prior to debridement (In Clinic Only). o Hurricaine Topical Anesthetic Spray applied to wound bed prior to debridement (In Clinic Only). Primary Wound Dressing Wound #1 Right Calcaneus o Santyl Ointment Secondary Dressing Wound #1 Right Calcaneus o Conform/Kerlix o Non-adherent pad o Other - allevyn heel cup stretch netting #5 Dressing Change Frequency Wound #1 Right Calcaneus o Change dressing every day. Follow-up Appointments Wound #1 Right Calcaneus o Return Appointment in 1 week. Off-Loading Wound #1 Right Calcaneus o Turn and reposition every 2 hours o Other: - float heels while sitting or lying in the bed [place], Kajsa [name]. ([id number]) Additional Orders / Instructions Wound #1 Right Calcaneus o Increase protein intake. o Other: - Vitamin C, Zinc Patient Medications Allergies: codeine, metformin, penicillin, pioglitazone, fenofibrate Notifications Medication Indication Start End lidocaine DOSE 1 - topical 4 % cream - 1 cream topical Electronic Signature(s) Signed: 05/26/2017 4:55:35 PM By: Alric Quan Signed: 05/29/2017 8:04:39 AM By: Worthy Keeler PA-C Entered By: Alric Quan on 05/26/2017 15:59:31 [place], [place] ([id number]) -------------------------------------------------------------------------------- Prescription 05/26/2017 Patient Name: [name] Provider: Worthy Keeler PA-C Date of Birth: 08-Dec-1929 NPI#: [id number] Sex: F DEA#: [id number] Phone #: [phone] License #: Patient Address: [facility] [address], [place], [place] [address] [place], [place] [address] [phone] Allergies codeine metformin penicillin pioglitazone fenofibrate Medication Medication: Route: Strength: Form: lidocaine topical 4% cream Class: TOPICAL LOCAL ANESTHETICS Dose: Frequency / Time: Indication:  1 1 cream topical Number of Refills: Number of Units: 0 Generic Substitution: Start Date: End Date: Administered at [place]: Yes Time Administered: Time Discontinued: Note to Pharmacy: Signature(s): Date(s): [name], [name] ([id number]) Electronic Signature(s) Signed: 05/26/2017 4:55:35 PM By: Alric Quan Signed: 05/29/2017 8:04:39 AM By: Worthy Keeler PA-C Entered By: Alric Quan on 05/26/2017 15:59:31 [place], [place]. ([id number]) --------------------------------------------------------------------------------  Problem List Details Patient Name: [name], [name] Date of Service: 05/26/2017 3:15 PM Medical Record Number: [id number] Patient Account Number: [id number] Date of Birth/Sex: 24-Jun-1929 (84 y.o. Female) Treating RN: Carolyne Fiscal, Debi Primary Care Provider: Eliezer Lofts Other Clinician: Referring Provider: Eliezer Lofts Treating Provider/Extender: Melburn Hake, Donathan Buller Weeks in Treatment: 2 Active Problems ICD-10 Encounter Code Description Active Date Diagnosis L89.610 Pressure ulcer of right heel, unstageable 05/09/2017 Yes I70.234 Atherosclerosis of native arteries of right leg with ulceration of heel 05/09/2017 Yes and midfoot I21.A9 Other myocardial infarction type 05/09/2017 Yes R54 Age-related physical debility 05/09/2017 Yes Inactive Problems Resolved Problems Electronic  Signature(s) Signed: 05/29/2017 8:04:39 AM By: Worthy Keeler PA-C Entered By: Worthy Keeler on 05/26/2017 15:46:16 Hintze, Louvina [name]. ([id number]) -------------------------------------------------------------------------------- Progress Note Details Patient Name: [name], [name] Date of Service: 05/26/2017 3:15 PM Medical Record Number: [id number] Patient Account Number: [id number] Date of Birth/Sex: [birth year] (84 y.o. Female) Treating RN: Carolyne Fiscal, Debi Primary Care Provider: Eliezer Lofts Other Clinician: Referring Provider: Eliezer Lofts Treating Provider/Extender: Melburn Hake, Ethal Gotay Weeks in Treatment: 2 Subjective Chief Complaint Information obtained from Patient she presents for initial evaluation of a right posterior heel pressure ulcer History of Present Illness (HPI) The following HPI elements were documented for the patient's wound: Location: right posterior heel Quality: intermittent burning and sharp pains Duration: pressure ulcer has been present for >6 weeks Context: recurrent pressure Modifying Factors: prolonged and unrelieved pressure delay wound healing 05/09/17-she is here for initial evaluation of a right posterior heel ulcer, accompanied by her daughter. She was hospitalized in November for right hip fracture and repair. 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 She states that during her hospitalization she developed multiple pressure ulcers, the only remaining is the posterior right heel. Home health has been following. She has applied a variety of ointments including Vaseline and Desitin to the right heel. She states that approximately 4 years ago she followed with Dr. Oneida Alar at [facility] secondary to claudication. She states there is "nothing they can do" and denies ever having intervention. She is a former smoker, quitting >30 years ago She is a diabetic, her last A1c was 6.6 in  December. ABIs in the office were unable to be obtained. There is soft lifted eschar to the ulcer which has been selectively debrided. I recommended a second opinion from vascular medicine, the would prefer to stay local, therefore a referral to [place] [place] [facility] has been sent. We will apply Santyl daily; she has been advised to contact the clinic as soon as possible if she is unable to afford the medication. She has been educated on the need for aggressive offloading to the right heel. 05/19/17 on evaluation today patient appears to be doing rather well in regard to her right posterior heel ulcer. She has been tolerating the dressing changes without complication. With that being said I am very pleased with how things have been going at this point in time from what I see at this point. Patient likewise is happy with the progress more specifically her  daughter is to has been taking care of this ulcer in particular. She has been using Santyl currently with good result. 05/26/17 on evaluation today patient appears to be doing about the same in regard to her ulcer at this point. She has been tolerating the dressing changes. We have been using Santyl to help clean up the ulcer bed. She does have her appointment with [facility] on 05/30/17. Hopefully they will be able to determine her blood flow and whether any intervention may be necessary or not. With that being said she fortunately is doing well not having significant pain at this time. Patient History Information obtained from Patient. Family History Cancer - Mother,Child, Diabetes - Child, Hypertension - Child, Thyroid Problems - Child, No family history of Heart Disease, Hereditary Spherocytosis, Kidney Disease, Lung Disease, Seizures, Stroke, Tuberculosis. Social History Former smoker - quit 50 yrs ago, Marital Status - Widowed, Alcohol Use - Never, Drug Use - No History, Caffeine Use - Daily. [name], [name]  ([id number]) Review of Systems (ROS) Constitutional Symptoms (General Health) Denies complaints or symptoms of Fever, Chills. Respiratory The patient has no complaints or symptoms. Cardiovascular The patient has no complaints or symptoms. Psychiatric The patient has no complaints or symptoms. Objective Constitutional Well-nourished and well-hydrated in no acute distress. Vitals Time Taken: 3:35 PM, Height: 63 in, Temperature: 98.4 F, Pulse: 79 bpm, Respiratory Rate: 16 breaths/min, Blood Pressure: 114/52 mmHg. Respiratory normal breathing without difficulty. clear to auscultation bilaterally. Cardiovascular regular rate and rhythm with normal S1, S2. Psychiatric this patient is able to make decisions and demonstrates good insight into disease process. Alert and Oriented x 3. pleasant and cooperative. General Notes: Patient's wound bed still continues to be mostly slough covered although it does seem to be loosening up a little bit. No sharp debridement was performed today as I am unsure of the sufficiency of her blood flow and would prefer to put off any further debridement until vascular examination. Integumentary (Hair, Skin) Wound #1 status is Open. Original cause of wound was Pressure Injury. The wound is located on the Right Calcaneus. The wound measures 0.9cm length x 2.5cm width x 0.5cm depth; 1.767cm^2 area and 0.884cm^3 volume. There is no tunneling or undermining noted. There is a large amount of serous drainage noted. The wound margin is distinct with the outline attached to the wound base. There is small (1-33%) granulation within the wound bed. There is a large (67-100%) amount of necrotic tissue within the wound bed including Adherent Slough. The periwound skin appearance exhibited: Maceration. Periwound temperature was noted as No Abnormality. The periwound has tenderness on palpation. Assessment Active Problems ICD-10 [name], [name] ([id number]) L89.610 -  Pressure ulcer of right heel, unstageable I70.234 - Atherosclerosis of native arteries of right leg with ulceration of heel and midfoot I21.A9 - Other myocardial infarction type R54 - Age-related physical debility Plan Wound Cleansing: Wound #1 Right Calcaneus: Clean wound with Normal Saline. Cleanse wound with mild soap and water May Shower, gently pat wound dry prior to applying new dressing. Anesthetic (add to Medication List): Wound #1 Right Calcaneus: Topical Lidocaine 4% cream applied to wound bed prior to debridement (In Clinic Only). Hurricaine Topical Anesthetic Spray applied to wound bed prior to debridement (In Clinic Only). Primary Wound Dressing: Wound #1 Right Calcaneus: Santyl Ointment Secondary Dressing: Wound #1 Right Calcaneus: Conform/Kerlix Non-adherent pad Other - allevyn heel cup stretch netting #5 Dressing Change Frequency: Wound #1 Right Calcaneus: Change dressing every day. Follow-up Appointments: Wound #1  Right Calcaneus: Return Appointment in 1 week. Off-Loading: Wound #1 Right Calcaneus: Turn and reposition every 2 hours Other: - float heels while sitting or lying in the bed Additional Orders / Instructions: Wound #1 Right Calcaneus: Increase protein intake. Other: - Vitamin C, Zinc The following medication(s) was prescribed: lidocaine topical 4 % cream 1 1 cream topical was prescribed at facility I am going to recommend that we continue with the Current wound care measures including cental for the next week. We will see were things stand following. We will see what vein and vascular has to say following her evaluation on May 30, 2017. Please see above for specific wound care orders. We will see patient for re-evaluation in 1 week(s) here in the clinic. If anything worsens or changes patient will contact our office for additional recommendations. [name], [name] ([id number]) Electronic Signature(s) Signed: 05/29/2017 8:04:39 AM By: Worthy Keeler PA-C Entered By: Worthy Keeler on 05/26/2017 16:32:18 [name], [name] EMarland Kitchen ([id number]) -------------------------------------------------------------------------------- ROS/PFSH Details Patient Name: [name] Date of Service: 05/26/2017 3:15 PM Medical Record Number: [id number] Patient Account Number: [id number] Date of Birth/Sex: 12/05/1929 (84 y.o. Female) Treating RN: Carolyne Fiscal, Debi Primary Care Provider: Eliezer Lofts Other Clinician: Referring Provider: Eliezer Lofts Treating Provider/Extender: Melburn Hake, Karene Bracken Weeks in Treatment: 2 Information Obtained From Patient Wound History Do you currently have one or more open woundso Yes How many open wounds do you currently haveo 2 Approximately how long have you had your woundso Mar 11 2017 How have you been treating your wound(s) until nowo desitin Has your wound(s) ever healed and then re-openedo No Have you had any lab work done in the past montho No Have you tested positive for an antibiotic resistant organism (MRSA, VRE)o No Have you tested positive for osteomyelitis (bone infection)o No Have you had any tests for circulation on your legso Yes Who ordered the testo 2014 Have you had other problems associated with your woundso Swelling Constitutional Symptoms (General Health) Complaints and Symptoms: Negative for: Fever; Chills Eyes Medical History: Positive for: Cataracts - surgery Hematologic/Lymphatic Medical History: Positive for: Anemia Respiratory Complaints and Symptoms: No Complaints or Symptoms Cardiovascular Complaints and Symptoms: No Complaints or Symptoms Medical History: Positive for: Congestive Heart Failure; Myocardial Infarction Endocrine Medical History: Positive for: Type II Diabetes Time with diabetes: 39 yrs [name], [name] ([id number]) Treated with: Insulin Blood sugar tested every day: Yes Tested : Musculoskeletal Medical History: Positive for: Osteoarthritis Neurologic Medical  History: Positive for: Neuropathy Psychiatric Complaints and Symptoms: No Complaints or Symptoms HBO Extended History Items Eyes: Cataracts Immunizations Pneumococcal Vaccine: Received Pneumococcal Vaccination: Yes Implantable Devices Family and Social History Cancer: Yes - Mother,Child; Diabetes: Yes - Child; Heart Disease: No; Hereditary Spherocytosis: No; Hypertension: Yes - Child; Kidney Disease: No; Lung Disease: No; Seizures: No; Stroke: No; Thyroid Problems: Yes - Child; Tuberculosis: No; Former smoker - quit 50 yrs ago; Marital Status - Widowed; Alcohol Use: Never; Drug Use: No History; Caffeine Use: Daily; Financial Concerns: No; Food, Clothing or Shelter Needs: No; Support System Lacking: No; Transportation Concerns: No; Advanced Directives: No; Patient does not want information on Advanced Directives; Do not resuscitate: No; Living Will: Yes (Not Provided); Medical Power of Attorney: No Physician Affirmation I have reviewed and agree with the above information. Electronic Signature(s) Signed: 05/26/2017 4:55:35 PM By: Alric Quan Signed: 05/29/2017 8:04:39 AM By: Worthy Keeler PA-C Entered By: Worthy Keeler on 05/26/2017 16:31:28 Boehne, Mykelle [name]. ([id number]) -------------------------------------------------------------------------------- SuperBill Details Patient Name: LASHUN, RAMSEYER [name].  Date of Service: 05/26/2017 Medical Record Number: [id number] Patient Account Number: [id number] Date of Birth/Sex: 10-03-1929 (84 y.o. Female) Treating RN: Carolyne Fiscal, Debi Primary Care Provider: Eliezer Lofts Other Clinician: Referring Provider: Eliezer Lofts Treating Provider/Extender: Melburn Hake, Juhi Lagrange Weeks in Treatment: 2 Diagnosis Coding ICD-10 Codes Code Description L89.610 Pressure ulcer of right heel, unstageable I70.234 Atherosclerosis of native arteries of right leg with ulceration of heel and midfoot I21.A9 Other myocardial infarction type R54 Age-related physical  debility Facility Procedures CPT4 Code: [id number] Description: 99213 - WOUND CARE VISIT-LEV 3 EST PT Modifier: Quantity: 1 Physician Procedures CPT4 Code Description: [id number] 14103 - WC PHYS LEVEL 3 - EST PT ICD-10 Diagnosis Description L89.610 Pressure ulcer of right heel, unstageable I70.234 Atherosclerosis of native arteries of right leg with ulceration I21.A9 Other myocardial infarction type  R54 Age-related physical debility Modifier: of heel and midfo Quantity: 1 ot Electronic Signature(s) Signed: 05/29/2017 8:04:39 AM By: Worthy Keeler PA-C Entered By: Worthy Keeler on 05/26/2017 16:32:35

## 2017-05-30 ENCOUNTER — Ambulatory Visit (INDEPENDENT_AMBULATORY_CARE_PROVIDER_SITE_OTHER): Payer: PPO | Admitting: Vascular Surgery

## 2017-05-30 ENCOUNTER — Encounter (INDEPENDENT_AMBULATORY_CARE_PROVIDER_SITE_OTHER): Payer: Self-pay | Admitting: Vascular Surgery

## 2017-05-30 VITALS — BP 116/69 | HR 89 | Resp 15 | Ht 63.0 in | Wt 139.0 lb

## 2017-05-30 DIAGNOSIS — I739 Peripheral vascular disease, unspecified: Secondary | ICD-10-CM

## 2017-05-30 DIAGNOSIS — E1142 Type 2 diabetes mellitus with diabetic polyneuropathy: Secondary | ICD-10-CM | POA: Diagnosis not present

## 2017-05-30 DIAGNOSIS — E785 Hyperlipidemia, unspecified: Secondary | ICD-10-CM | POA: Diagnosis not present

## 2017-05-30 NOTE — Progress Notes (Signed)
Subjective:    Patient ID: [name], female    DOB: [birth year], 84 y.o.   MRN: [id number] Chief Complaint  Patient presents with  . New Patient (Initial Visit)    Non healing wound   Presents as a new patient referred by Dr. Rene Kocher from the wound center at [facility] for evaluation of a right heel wound and right lower extremity pain.  The wound center / referring physician ordered a consult without any vascular studies.  There is a letter in the referral paperwork stating that the patient was seen at [facility] in 2014 and would like a second opinion with our office for a nonhealing wound.  The patient was seen with her daughter who seems to provide most of her health care.  The patient and her daughter endorses a history of her breaking her right hip in November 2018.  During her inpatient stay, the patient developed "bedsores".  The sores were located to the patient's buttocks and bilateral heels.  The daughter notes that her sacral decubitus is almost healed and the left heel wound is now healed.  The 2cm x 2cm right heel ulceration continues to show no improvement in healing despite local wound care efforts from the [facility] wound care.  Currently the patient is treating the area with Santyl ointment the patient states previous vascular studies completed to her lower extremity revealed "blockages" and that she was "not a candidate for surgery".  The patient denies any rest pain or new ulcer formation.  The patient engages in minimal ambulation.  The patient denies any fever, nausea or vomiting.   Review of Systems  Constitutional: Negative.   HENT: Negative.   Eyes: Negative.   Respiratory: Negative.   Cardiovascular: Negative.   Gastrointestinal: Negative.   Endocrine: Negative.   Genitourinary: Negative.   Musculoskeletal: Negative.   Skin: Positive for wound.  Allergic/Immunologic: Negative.   Neurological:  Negative.   Hematological: Negative.   Psychiatric/Behavioral: Negative.       Objective:   Physical Exam  Constitutional: She is oriented to person, place, and time. She appears well-developed and well-nourished. No distress.  HENT:  Head: Normocephalic and atraumatic.  Eyes: Conjunctivae are normal. Pupils are equal, round, and reactive to light.  Neck: Normal range of motion.  Cardiovascular: Normal rate, regular rhythm, normal heart sounds and intact distal pulses.  Pulses:      Radial pulses are 2+ on the right side, and 2+ on the left side.  Unable to palpate pedal pulses bilaterally.  Pulmonary/Chest: Effort normal and breath sounds normal.  Musculoskeletal: Normal range of motion. She exhibits edema (Minimal to mild nonpitting edema noted bilaterally).  Neurological: She is alert and oriented to person, place, and time.  Skin: She is not diaphoretic.  Right heel: 2 cm x 2 cm ulceration with fat layer exposed.  There is no granulation tissue noted to the wound bed.  There is about 100% fibrinous exudate to the wound bed.  Clear drainage.  There is no surrounding erythema.  There is no induration.  There is no cellulitis to the right lower extremity.  Psychiatric: She has a normal mood and affect. Her behavior is normal. Judgment and thought content normal.  Vitals reviewed.  BP 116/69 (BP Location: Left Arm, Patient Position: Sitting)   Pulse 89   Resp 15   Ht 5\' 3"  (1.6 m)   Wt 139 lb (63 kg)   BMI 24.62 kg/m  Past Medical History:  Diagnosis Date  . Anxiety   . Diaphragmatic hernia without mention of obstruction or gangrene   . Diverticulosis of colon (without mention of hemorrhage) 2009  . Fibromyalgia   . GERD (gastroesophageal reflux disease)   . Hiatal hernia 2009   s/p nissen fundoplication 1610.    Marland Kitchen Hx of adenomatous colonic polyps 2007, 2009   Adenomatous polyps in 2007 and 2009.  Hyperplastic polyps in 2009  . Hyperlipidemia   . IBS (irritable bowel  syndrome)   . Internal hemorrhoids without mention of complication 9604   Seen at colonoscopy.  . Thyroid disease   . Type II or unspecified type diabetes mellitus with neurological manifestations, not stated as uncontrolled(250.60)    Social History   Socioeconomic History  . Marital status: Widowed    Spouse name: Not on file  . Number of children: 3  . Years of education: Not on file  . Highest education level: Not on file  Social Needs  . Financial resource strain: Not on file  . Food insecurity - worry: Not on file  . Food insecurity - inability: Not on file  . Transportation needs - medical: Not on file  . Transportation needs - non-medical: Not on file  Occupational History  . Occupation: retired    Fish farm manager: RETIRED  Tobacco Use  . Smoking status: Former Smoker    Packs/day: 0.25    Years: 4.00    Pack years: 1.00    Types: Cigarettes    Last attempt to quit: 03/10/1981    Years since quitting: 36.2  . Smokeless tobacco: Never Used  Substance and Sexual Activity  . Alcohol use: No    Alcohol/week: 0.0 oz  . Drug use: No  . Sexual activity: No  Other Topics Concern  . Not on file  Social History Narrative   Widowed   3 children         Past Surgical History:  Procedure Laterality Date  . ABDOMINAL HYSTERECTOMY    . BASAL CELL CARCINOMA EXCISION  04/2016   corner of left eye  . bmd  2007  . CATARACT EXTRACTION    . ESOPHAGOGASTRODUODENOSCOPY  2007  . HERNIA REPAIR    . INTRAMEDULLARY (IM) NAIL INTERTROCHANTERIC Right 03/01/2017   Procedure: INTRAMEDULLARY (IM) NAIL INTERTROCHANTRIC;  Surgeon: Hiram Gash, MD;  Location: [place];  Service: Orthopedics;  Laterality: Right;  . LAPAROSCOPIC NISSEN FUNDOPLICATION  5409  . ORIF WRIST FRACTURE Right 03/01/2017   Procedure: OPEN REDUCTION INTERNAL FIXATION (ORIF) WRIST FRACTURE;  Surgeon: Hiram Gash, MD;  Location: [place];  Service: Orthopedics;  Laterality: Right;  . THYROIDECTOMY, PARTIAL     Family  History  Problem Relation Age of Onset  . Uterine cancer Mother   . Cancer Mother   . Diabetes Maternal Grandfather   . Diabetes Son   . Hyperlipidemia Son   . Heart disease Paternal Grandmother   . Breast cancer Paternal Aunt   . Colon cancer Neg Hx   . Stomach cancer Neg Hx    Allergies  Allergen Reactions  . Codeine     Rapid heart rate  . Metformin     unspecified  . Penicillins     unspecified  . Pioglitazone     REACTION: rash  . Fenofibrate Palpitations      Assessment & Plan:  Presents as a new patient referred by Dr. Rene Kocher from the wound center at [facility] for evaluation of a  right heel wound and right lower extremity pain.  The wound center / referring physician ordered a consult without any vascular studies.  There is a letter in the referral paperwork stating that the patient was seen at [facility] in 2014 and would like a second opinion with our office for a nonhealing wound.  The patient was seen with her daughter who seems to provide most of her health care.  The patient and her daughter endorses a history of her breaking her right hip in November 2018.  During her inpatient stay, the patient developed "bedsores".  The sores were located to the patient's buttocks and bilateral heels.  The daughter notes that her sacral decubitus is almost healed and the left heel wound is now healed.  The 2cm x 2cm right heel ulceration continues to show no improvement in healing despite local wound care efforts from the [facility] wound care.  Currently the patient is treating the area with Santyl ointment the patient states previous vascular studies completed to her lower extremity revealed "blockages" and that she was "not a candidate for surgery".  The patient denies any rest pain or new ulcer formation.  The patient engages in minimal ambulation.  The patient denies any fever, nausea or vomiting.  1. PAD (peripheral artery  disease) ([place]) - New Patient has a past medical history of peripheral artery disease The patient developed bedsores including a slow healing wound to the right heel after she broke her right hip in November 2018 The patient has been treated by the [facility] wound clinic with minimal improvement I strongly recommended the patient undergo an ABI and a right lower extremity arterial duplex The patient's daughter refused this at this time stating that Dr. Burt Knack will do it. We discussed the possibility of her mother needing an angiogram in the future based on what the ABI and right lower extremity arterial duplex may show Again the patient noted that Dr. Burt Knack would also do this and that she has our number if she changes her mind Patient is to follow-up as needed  - VAS [place] ABI WITH/WO TBI; Future - VAS [place] LOWER EXTREMITY ARTERIAL DUPLEX; Future  2. Diabetic peripheral neuropathy associated with type 2 diabetes mellitus (HCC) - Stable Encouraged good control as its slows the progression of atherosclerotic disease  3. Hyperlipidemia, unspecified hyperlipidemia type - Stable Encouraged good control as its slows the progression of atherosclerotic disease  Current Outpatient Medications on File Prior to Visit  Medication Sig Dispense Refill  . acetaminophen (TYLENOL) 500 MG tablet Take 2 tabs po q8 hours x 14 days following surgery 84 tablet 2  . aspirin 81 MG tablet Take 81 mg by mouth daily.      . calcium carbonate (OS-CAL) 1250 (500 Ca) MG chewable tablet Chew 1 tablet by mouth daily.    . cyclobenzaprine (FLEXERIL) 10 MG tablet Take 10 mg by mouth 3 (three) times daily as needed.    Mariane Baumgarten Calcium (STOOL SOFTENER PO) Take 1 tablet by mouth daily as needed.    Marland Kitchen glucose blood (ONE TOUCH ULTRA TEST) test strip USE ONE STRIP TO CHECK GLUCOSE 4 TIMES A DAY 125 each 6  . insulin aspart (NOVOLOG FLEXPEN) 100 UNIT/ML FlexPen Inject 10 Units into the skin 3 (three) times  daily with meals. 15 mL 5  . insulin NPH Human (HUMULIN N,NOVOLIN N) 100 UNIT/ML injection Inject 15 Units into the skin at bedtime.     Marland Kitchen  omeprazole (PRILOSEC) 20 MG capsule Take 20 mg by mouth 2 (two) times daily before a meal.    . traMADol (ULTRAM) 50 MG tablet Take 1 tablet (50 mg total) by mouth every 6 (six) hours as needed. 50 tablet 0  . atorvastatin (LIPITOR) 40 MG tablet Take 1 tablet (40 mg total) by mouth daily at 6 PM. 30 tablet 11  . carvedilol (COREG) 6.25 MG tablet Take 1 tablet (6.25 mg total) by mouth 2 (two) times daily with a meal. 60 tablet 0  . furosemide (LASIX) 20 MG tablet Take 1 tablet (20 mg total) by mouth daily. 30 tablet 0  . losartan (COZAAR) 25 MG tablet Take 1 tablet (25 mg total) by mouth daily. 30 tablet 0   No current facility-administered medications on file prior to visit.    There are no Patient Instructions on file for this visit. No Follow-up on file.  Gwendalynn Eckstrom A Satine Hausner, PA-C

## 2017-05-31 ENCOUNTER — Telehealth: Payer: Self-pay | Admitting: Family Medicine

## 2017-05-31 DIAGNOSIS — I739 Peripheral vascular disease, unspecified: Secondary | ICD-10-CM

## 2017-05-31 NOTE — Telephone Encounter (Signed)
Copied from [place] [id number]. Topic: Referral - Request >> May 31, 2017 11:59 AM Antonieta Iba C wrote: Pt's daughter called in she said that pt seen [facility] and they are unhappy with location. They would like to know if office could assist with referring to another location here in [place]?   Please advise.

## 2017-06-01 DIAGNOSIS — L97519 Non-pressure chronic ulcer of other part of right foot with unspecified severity: Secondary | ICD-10-CM | POA: Diagnosis not present

## 2017-06-01 DIAGNOSIS — M25551 Pain in right hip: Secondary | ICD-10-CM | POA: Diagnosis not present

## 2017-06-01 DIAGNOSIS — M25531 Pain in right wrist: Secondary | ICD-10-CM | POA: Diagnosis not present

## 2017-06-01 NOTE — Telephone Encounter (Signed)
Order for referral and ABIs placed

## 2017-06-01 NOTE — Telephone Encounter (Signed)
Please place New Referral for Vascular consult with studies. Patient refused to get the studies done at [facility] after they had a consult there in [place]. I switched the patient to go to [facility] and you dont need to ass anything for that. Please put New Referral in for [facility] consult with whatever studies she needs.

## 2017-06-01 NOTE — Telephone Encounter (Signed)
Please contact pt to determine what location or change they want. Let me know if I need to put in additional referral.

## 2017-06-02 NOTE — Telephone Encounter (Signed)
Called the [facility] office and spoke to the Vascular lab. She recommends that you cancel your order you placed because that is not an order that their Dr's order. They will take care of ordering the studies that they need and will also make her the consult with the Vascular Dr.Please cancel the Vas [place] you put in. Patients daughter aware that [facility] will make voth appointments when they call her.

## 2017-06-02 NOTE — Telephone Encounter (Signed)
Canceled.

## 2017-06-05 ENCOUNTER — Telehealth: Payer: Self-pay | Admitting: Family Medicine

## 2017-06-05 ENCOUNTER — Encounter: Payer: PPO | Admitting: Physician Assistant

## 2017-06-05 DIAGNOSIS — L89619 Pressure ulcer of right heel, unspecified stage: Secondary | ICD-10-CM | POA: Diagnosis not present

## 2017-06-05 DIAGNOSIS — L89613 Pressure ulcer of right heel, stage 3: Secondary | ICD-10-CM | POA: Diagnosis not present

## 2017-06-05 DIAGNOSIS — L97519 Non-pressure chronic ulcer of other part of right foot with unspecified severity: Secondary | ICD-10-CM | POA: Diagnosis not present

## 2017-06-05 NOTE — Telephone Encounter (Signed)
See below note from vascular:  [name], [name] Petrin, [name] E, MD        This patient has already been referred to and had a consultation with [facility] on 05/30/17 regarding her PAD. The patient and her daughter refused future treatment there after the consultation stating that her cardiologist Dr. Burt Knack will follow her for her PAD.   I do not believe our office would get a different response; however, I wanted to inform you and discuss whether you would still like our office to schedule a consultation.   Thank you,  [name]      Note from referral state pt's daughter requested second referral to different vascular MD.. Please contact her and let her know the above statement from vascular.. does she really want second referral or to just have [name] see cardiology for PAD?

## 2017-06-05 NOTE — Telephone Encounter (Signed)
Spoke with Daughter.  They would like to get back in the [facility] in [place].  Ms. Herrig has seen Dr. Oneida Alar in the past.

## 2017-06-06 NOTE — Progress Notes (Signed)
[name], [name] ([id number]) Visit Report for 06/05/2017 Chief Complaint Document Details Patient Name: [name], [name] Date of Service: 06/05/2017 2:30 PM Medical Record Number: [id number] Patient Account Number: [id number] Date of Birth/Sex: 1929-08-03 (84 y.o. Female) Treating RN: Roger Shelter Primary Care Provider: Eliezer Lofts Other Clinician: Referring Provider: Eliezer Lofts Treating Provider/Extender: Melburn Hake, Derrian Rodak Weeks in Treatment: 3 Information Obtained from: Patient Chief Complaint she presents for initial evaluation of a right posterior heel pressure ulcer Electronic Signature(s) Signed: 06/05/2017 4:51:58 PM By: Worthy Keeler PA-C Entered By: Worthy Keeler on 06/05/2017 14:33:57 McCook, Winston ([id number]) -------------------------------------------------------------------------------- HPI Details Patient Name: [name] Reining E. Date of Service: 06/05/2017 2:30 PM Medical Record Number: [id number] Patient Account Number: [id number] Date of Birth/Sex: 12/09/1929 (84 y.o. Female) Treating RN: Roger Shelter Primary Care Provider: Eliezer Lofts Other Clinician: Referring Provider: Eliezer Lofts Treating Provider/Extender: Melburn Hake, Ryland Tungate Weeks in Treatment: 3 History of Present Illness Location: right posterior heel Quality: intermittent burning and sharp pains Duration: pressure ulcer has been present for >6 weeks Context: recurrent pressure Modifying Factors: prolonged and unrelieved pressure delay wound healing HPI Description: 05/09/17-she is here for initial evaluation of a right posterior heel ulcer, accompanied by her daughter. She was hospitalized in November for right hip fracture and repair. 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 She states that during her hospitalization she developed multiple pressure ulcers, the only remaining is the posterior right heel. Home health has been following.  She has applied a variety of ointments including Vaseline and Desitin to the right heel. She states that approximately 4 years ago she followed with Dr. Oneida Alar at [facility] Vascular secondary to claudication. She states there is "nothing they can do" and denies ever having intervention. She is a former smoker, quitting >30 years ago She is a diabetic, her last A1c was 6.6 in December. ABIs in the office were unable to be obtained. There is soft lifted eschar to the ulcer which has been selectively debrided. I recommended a second opinion from vascular medicine, the would prefer to stay local, therefore a referral to [place] and [facility] has been sent. We will apply Santyl daily; she has been advised to contact the clinic as soon as possible if she is unable to afford the medication. She has been educated on the need for aggressive offloading to the right heel. 05/19/17 on evaluation today patient appears to be doing rather well in regard to her right posterior heel ulcer. She has been tolerating the dressing changes without complication. With that being said I am very pleased with how things have been going at this point in time from what I see at this point. Patient likewise is happy with the progress more specifically her daughter is to has been taking care of this ulcer in particular. She has been using Santyl currently with good result. 05/26/17 on evaluation today patient appears to be doing about the same in regard to her ulcer at this point. She has been tolerating the dressing changes. We have been using Santyl to help clean up the ulcer bed. She does have her appointment with [facility] on 05/30/17. Hopefully they will be able to determine her blood flow and whether any intervention may be necessary or not. With that being said she fortunately is doing well not having significant pain at this time. 06/05/17 on evaluation today patient appears to be doing much  better in regard  to her right heel wound. She has been tolerating the dressing changes without complication. She is continuing to utilize the [place] at this point and this does seem to be doing very well for her. Overall I'm pleased with the progress that she has made in the interim from last week to this week she has much more granulation apparent in the base of the wound and there does not appear to be any evidence of infection. Her daughter has been doing very well caring for and cleaning the wound. Electronic Signature(s) Signed: 06/05/2017 4:51:58 PM By: Worthy Keeler PA-C Entered By: Worthy Keeler on 06/05/2017 16:00:06 [name], [name] ([id number]) -------------------------------------------------------------------------------- Physical Exam Details Patient Name: [name], [name] Date of Service: 06/05/2017 2:30 PM Medical Record Number: [id number] Patient Account Number: [id number] Date of Birth/Sex: [birth year] (84 y.o. Female) Treating RN: Roger Shelter Primary Care Provider: Eliezer Lofts Other Clinician: Referring Provider: Eliezer Lofts Treating Provider/Extender: Melburn Hake, Farah Lepak Weeks in Treatment: 3 Constitutional Well-nourished and well-hydrated in no acute distress. Respiratory normal breathing without difficulty. clear to auscultation bilaterally. Cardiovascular regular rate and rhythm with normal S1, S2. Psychiatric this patient is able to make decisions and demonstrates good insight into disease process. Alert and Oriented x 3. pleasant and cooperative. Notes At this time I'm going to recommend that we continue with the [facility] as far as debridement is concerned since this seems to be doing very well. I'm also reluctant to do much in the way of sharp debridement although to be honest this does not even appear to need it as it seems to be cleaning up so well with the Santyl. Electronic Signature(s) Signed: 06/05/2017 4:51:58 PM By: Worthy Keeler PA-C Entered By:  Worthy Keeler on 06/05/2017 16:00:55 [name], [name] ([id number]) -------------------------------------------------------------------------------- Physician Orders Details Patient Name: CHAPEL, SILVERTHORN E. Date of Service: 06/05/2017 2:30 PM Medical Record Number: [id number] Patient Account Number: [id number] Date of Birth/Sex: 09/29/1929 (84 y.o. Female) Treating RN: Roger Shelter Primary Care Provider: Eliezer Lofts Other Clinician: Referring Provider: Eliezer Lofts Treating Provider/Extender: Melburn Hake, Jill Ruppe Weeks in Treatment: 3 Verbal / Phone Orders: No Diagnosis Coding ICD-10 Coding Code Description L89.610 Pressure ulcer of right heel, unstageable I70.234 Atherosclerosis of native arteries of right leg with ulceration of heel and midfoot I21.A9 Other myocardial infarction type R54 Age-related physical debility Wound Cleansing Wound #1 Right Calcaneus o Clean wound with Normal Saline. o Cleanse wound with mild soap and water o May Shower, gently pat wound dry prior to applying new dressing. Anesthetic (add to Medication List) Wound #1 Right Calcaneus o Topical Lidocaine 4% cream applied to wound bed prior to debridement (In Clinic Only). o Hurricaine Topical Anesthetic Spray applied to wound bed prior to debridement (In Clinic Only). Primary Wound Dressing Wound #1 Right Calcaneus o Santyl Ointment Secondary Dressing Wound #1 Right Calcaneus o Conform/Kerlix o Non-adherent pad o Other - allevyn heel cup stretch netting #5 Dressing Change Frequency Wound #1 Right Calcaneus o Change dressing every day. Follow-up Appointments Wound #1 Right Calcaneus o Return Appointment in 1 week. Off-Loading Wound #1 Right Calcaneus o Turn and reposition every 2 hours o Other: - float heels while sitting or lying in the bed Lohse, Caleigha E. ([id number]) Additional Orders / Instructions Wound #1 Right Calcaneus o Vitamin A; Vitamin C, Zinc o Increase  protein intake. o Other: - Vitamin C, Zinc Electronic Signature(s) Signed: 06/05/2017 4:51:58 PM By: Worthy Keeler PA-C Entered By: Worthy Keeler on 06/05/2017 16:01:08 [name], [name] E. ([id number]) --------------------------------------------------------------------------------  Problem List Details Patient Name: CORNELLA, EMMER. Date of Service: 06/05/2017 2:30 PM Medical Record Number: [id number] Patient Account Number: [id number] Date of Birth/Sex: [birth year] (84 y.o. Female) Treating RN: Roger Shelter Primary Care Provider: Eliezer Lofts Other Clinician: Referring Provider: Eliezer Lofts Treating Provider/Extender: Melburn Hake, Tj Kitchings Weeks in Treatment: 3 Active Problems ICD-10 Encounter Code Description Active Date Diagnosis L89.610 Pressure ulcer of right heel, unstageable 05/09/2017 Yes I70.234 Atherosclerosis of native arteries of right leg with ulceration of heel 05/09/2017 Yes and midfoot I21.A9 Other myocardial infarction type 05/09/2017 Yes R54 Age-related physical debility 05/09/2017 Yes Inactive Problems Resolved Problems Electronic Signature(s) Signed: 06/05/2017 4:51:58 PM By: Worthy Keeler PA-C Entered By: Worthy Keeler on 06/05/2017 14:33:16 [name], [name] E. ([id number]) -------------------------------------------------------------------------------- Progress Note Details Patient Name: [name], [name] E. Date of Service: 06/05/2017 2:30 PM Medical Record Number: [id number] Patient Account Number: [id number] Date of Birth/Sex: February 23, 1930 (84 y.o. Female) Treating RN: Roger Shelter Primary Care Provider: Eliezer Lofts Other Clinician: Referring Provider: Eliezer Lofts Treating Provider/Extender: Melburn Hake, Peaches Vanoverbeke Weeks in Treatment: 3 Subjective Chief Complaint Information obtained from Patient she presents for initial evaluation of a right posterior heel pressure ulcer History of Present Illness (HPI) The following HPI elements were documented for the  patient's wound: Location: right posterior heel Quality: intermittent burning and sharp pains Duration: pressure ulcer has been present for >6 weeks Context: recurrent pressure Modifying Factors: prolonged and unrelieved pressure delay wound healing 05/09/17-she is here for initial evaluation of a right posterior heel ulcer, accompanied by her daughter. She was hospitalized in November for right hip fracture and repair. 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 She states that during her hospitalization she developed multiple pressure ulcers, the only remaining is the posterior right heel. Home health has been following. She has applied a variety of ointments including Vaseline and Desitin to the right heel. She states that approximately 4 years ago she followed with Dr. Oneida Alar at [facility] secondary to claudication. She states there is "nothing they can do" and denies ever having intervention. She is a former smoker, quitting >30 years ago She is a diabetic, her last A1c was 6.6 in December. ABIs in the office were unable to be obtained. There is soft lifted eschar to the ulcer which has been selectively debrided. I recommended a second opinion from vascular medicine, the would prefer to stay local, therefore a referral to [place] [facility] has been sent. We will apply Santyl daily; she has been advised to contact the clinic as soon as possible if she is unable to afford the medication. She has been educated on the need for aggressive offloading to the right heel. 05/19/17 on evaluation today patient appears to be doing rather well in regard to her right posterior heel ulcer. She has been tolerating the dressing changes without complication. With that being said I am very pleased with how things have been going at this point in time from what I see at this point. Patient likewise is happy with the progress more  specifically her daughter is to has been taking care of this ulcer in particular. She has been using Santyl currently with good result. 05/26/17 on evaluation today patient appears to be doing about the same in regard to her ulcer at this point. She has been tolerating the dressing changes. We have been using Santyl to help clean up the ulcer bed. She does have her appointment with [facility] on 05/30/17. Hopefully they will be able to determine her blood flow and whether any intervention may be necessary or not. With that being said she fortunately is doing well not having significant pain at this time. 06/05/17 on evaluation today patient appears to be doing much better in regard to her right heel wound. She has been tolerating the dressing changes without complication. She is continuing to utilize the [place] at this point and this does seem to be doing very well for her. Overall I'm pleased with the progress that she has made in the interim from last week to this week she has much more granulation apparent in the base of the wound and there does not appear to be any evidence of infection. Her daughter has been doing very well caring for and cleaning the wound. Patient History Information obtained from Patient. Family History KLOIE, WHITING ([id number]) Cancer - Mother,Child, Diabetes - Child, Hypertension - Child, Thyroid Problems - Child, No family history of Heart Disease, Hereditary Spherocytosis, Kidney Disease, Lung Disease, Seizures, Stroke, Tuberculosis. Social History Former smoker - quit 50 yrs ago, Marital Status - Widowed, Alcohol Use - Never, Drug Use - No History, Caffeine Use - Daily. Review of Systems (ROS) Constitutional Symptoms (General Health) Denies complaints or symptoms of Fever, Chills. Respiratory The patient has no complaints or symptoms. Cardiovascular The patient has no complaints or symptoms. Psychiatric The patient has no complaints or  symptoms. Objective Constitutional Well-nourished and well-hydrated in no acute distress. Vitals Time Taken: 2:23 PM, Height: 63 in, Temperature: 98.2 F, Pulse: 78 bpm, Respiratory Rate: 16 breaths/min, Blood Pressure: 105/66 mmHg. Respiratory normal breathing without difficulty. clear to auscultation bilaterally. Cardiovascular regular rate and rhythm with normal S1, S2. Psychiatric this patient is able to make decisions and demonstrates good insight into disease process. Alert and Oriented x 3. pleasant and cooperative. General Notes: At this time I'm going to recommend that we continue with the [facility] as far as debridement is concerned since this seems to be doing very well. I'm also reluctant to do much in the way of sharp debridement although to be honest this does not even appear to need it as it seems to be cleaning up so well with the Santyl. Integumentary (Hair, Skin) Wound #1 status is Open. Original cause of wound was Pressure Injury. The wound is located on the Right Calcaneus. The wound measures 1cm length x 2.4cm width x 0.5cm depth; 1.885cm^2 area and 0.942cm^3 volume. There is no tunneling or undermining noted. There is a large amount of serosanguineous drainage noted. The wound margin is distinct with the outline attached to the wound base. There is large (67-100%) red granulation within the wound bed. There is a small (1-33%) amount of necrotic tissue within the wound bed including Adherent Slough. The periwound skin appearance exhibited: Maceration. Periwound temperature was noted as No Abnormality. The periwound has tenderness on palpation. [name], [name] E. ([id number]) Assessment Active Problems ICD-10 L89.610 - Pressure ulcer of right heel, unstageable I70.234 - Atherosclerosis of native arteries of right leg with ulceration of heel and midfoot I21.A9 - Other myocardial infarction type R54 - Age-related physical debility Plan Wound Cleansing: Wound #1 Right  Calcaneus: Clean wound with Normal Saline. Cleanse wound with mild soap and water May Shower, gently pat wound dry prior to applying new dressing. Anesthetic (add to Medication List): Wound #1 Right Calcaneus: Topical Lidocaine 4% cream applied to wound bed prior to debridement (In Clinic Only). Hurricaine Topical Anesthetic Spray applied  to wound bed prior to debridement (In Clinic Only). Primary Wound Dressing: Wound #1 Right Calcaneus: Santyl Ointment Secondary Dressing: Wound #1 Right Calcaneus: Conform/Kerlix Non-adherent pad Other - allevyn heel cup stretch netting #5 Dressing Change Frequency: Wound #1 Right Calcaneus: Change dressing every day. Follow-up Appointments: Wound #1 Right Calcaneus: Return Appointment in 1 week. Off-Loading: Wound #1 Right Calcaneus: Turn and reposition every 2 hours Other: - float heels while sitting or lying in the bed Additional Orders / Instructions: Wound #1 Right Calcaneus: Vitamin A; Vitamin C, Zinc Increase protein intake. Other: - Vitamin C, Zinc We will continue with the Santyl for the next week and subsequently I will actually be seeing her in [place] for her next visit as we are transferring care there apparently which I just found out today. She is also going to be going to vascular and [place], [place]. ([id number]) [place] as well for evaluation and testing going forward. We will see were things stand in one weeks time there in [place]. Please see above for specific wound care orders. We will see patient for re-evaluation in 1 week(s) here in the clinic. If anything worsens or changes patient will contact our office for additional recommendations. Electronic Signature(s) Signed: 06/05/2017 4:51:58 PM By: Worthy Keeler PA-C Entered By: Worthy Keeler on 06/05/2017 16:01:40 [name], [name] ([id number]) -------------------------------------------------------------------------------- ROS/PFSH Details Patient Name:  [name], VANDERLOOP E. Date of Service: 06/05/2017 2:30 PM Medical Record Number: [id number] Patient Account Number: [id number] Date of Birth/Sex: [birth year] (84 y.o. Female) Treating RN: Roger Shelter Primary Care Provider: Eliezer Lofts Other Clinician: Referring Provider: Eliezer Lofts Treating Provider/Extender: Melburn Hake, Marge Vandermeulen Weeks in Treatment: 3 Information Obtained From Patient Wound History Do you currently have one or more open woundso Yes How many open wounds do you currently haveo 2 Approximately how long have you had your woundso Mar 11 2017 How have you been treating your wound(s) until nowo desitin Has your wound(s) ever healed and then re-openedo No Have you had any lab work done in the past montho No Have you tested positive for an antibiotic resistant organism (MRSA, VRE)o No Have you tested positive for osteomyelitis (bone infection)o No Have you had any tests for circulation on your legso Yes Who ordered the testo 2014 Have you had other problems associated with your woundso Swelling Constitutional Symptoms (General Health) Complaints and Symptoms: Negative for: Fever; Chills Eyes Medical History: Positive for: Cataracts - surgery Hematologic/Lymphatic Medical History: Positive for: Anemia Respiratory Complaints and Symptoms: No Complaints or Symptoms Cardiovascular Complaints and Symptoms: No Complaints or Symptoms Medical History: Positive for: Congestive Heart Failure; Myocardial Infarction Endocrine Medical History: Positive for: Type II Diabetes Time with diabetes: 64 yrs [name], [name] E. ([id number]) Treated with: Insulin Blood sugar tested every day: Yes Tested : Musculoskeletal Medical History: Positive for: Osteoarthritis Neurologic Medical History: Positive for: Neuropathy Psychiatric Complaints and Symptoms: No Complaints or Symptoms HBO Extended History Items Eyes: Cataracts Immunizations Pneumococcal Vaccine: Received Pneumococcal  Vaccination: Yes Implantable Devices Family and Social History Cancer: Yes - Mother,Child; Diabetes: Yes - Child; Heart Disease: No; Hereditary Spherocytosis: No; Hypertension: Yes - Child; Kidney Disease: No; Lung Disease: No; Seizures: No; Stroke: No; Thyroid Problems: Yes - Child; Tuberculosis: No; Former smoker - quit 50 yrs ago; Marital Status - Widowed; Alcohol Use: Never; Drug Use: No History; Caffeine Use: Daily; Financial Concerns: No; Food, Clothing or Shelter Needs: No; Support System Lacking: No; Transportation Concerns: No; Advanced Directives: No; Patient does not want information on Advanced Directives; Do  not resuscitate: No; Living Will: Yes (Not Provided); Medical Power of Attorney: No Physician Affirmation I have reviewed and agree with the above information. Electronic Signature(s) Signed: 06/05/2017 4:14:37 PM By: Roger Shelter Signed: 06/05/2017 4:51:58 PM By: Worthy Keeler PA-C Entered By: Worthy Keeler on 06/05/2017 16:00:26 Lemmerman, Carmella E. ([id number]) -------------------------------------------------------------------------------- SuperBill Details Patient Name: SHAMECKA, HOCUTT E. Date of Service: 06/05/2017 Medical Record Number: [id number] Patient Account Number: [id number] Date of Birth/Sex: [birth year] (84 y.o. Female) Treating RN: Roger Shelter Primary Care Provider: Eliezer Lofts Other Clinician: Referring Provider: Eliezer Lofts Treating Provider/Extender: Melburn Hake, Gunhild Bautch Weeks in Treatment: 3 Diagnosis Coding ICD-10 Codes Code Description L89.610 Pressure ulcer of right heel, unstageable I70.234 Atherosclerosis of native arteries of right leg with ulceration of heel and midfoot I21.A9 Other myocardial infarction type R54 Age-related physical debility Facility Procedures CPT4 Code: [id number] Description: [phone] - WOUND CARE VISIT-LEV 2 EST PT Modifier: Quantity: 1 Physician Procedures CPT4 Code Description: [id number] - WC PHYS LEVEL 3 - EST  PT ICD-10 Diagnosis Description L89.610 Pressure ulcer of right heel, unstageable I70.234 Atherosclerosis of native arteries of right leg with ulceration I21.A9 Other myocardial infarction type  R54 Age-related physical debility Modifier: of heel and midfo Quantity: 1 ot Electronic Signature(s) Signed: 06/05/2017 4:51:58 PM By: Worthy Keeler PA-C Entered By: Worthy Keeler on 06/05/2017 16:01:58

## 2017-06-06 NOTE — Telephone Encounter (Signed)
Sent message to Seth Bake at [facility] that patient wants to return to their office.

## 2017-06-08 ENCOUNTER — Other Ambulatory Visit: Payer: Self-pay

## 2017-06-08 DIAGNOSIS — I739 Peripheral vascular disease, unspecified: Secondary | ICD-10-CM

## 2017-06-08 NOTE — Progress Notes (Signed)
FYNN, ADEL ([id number]) Visit Report for 06/05/2017 Arrival Information Details Patient Name: [name], [name] Date of Service: 06/05/2017 2:30 PM Medical Record Number: [id number] Patient Account Number: [id number] Date of Birth/Sex: 1929/12/05 (84 y.o. Female) Treating RN: Carolyne Fiscal, Debi Primary Care [name]: [name] Other Clinician: Referring Baillie Mohammad: [name] Treating Chaselyn Nanney/Extender: Melburn Hake, HOYT Weeks in Treatment: 3 Visit Information History Since Last Visit All ordered tests and consults were completed: No Patient Arrived: Wheel Chair Added or deleted any medications: No Arrival Time: 14:18 Any new allergies or adverse reactions: No Accompanied By: daughter Had a fall or experienced change in No Transfer Assistance: EasyPivot Patient activities of daily living that may affect Lift risk of falls: Patient Identification Verified: Yes Signs or symptoms of abuse/neglect since last visito No Secondary Verification Process Yes Hospitalized since last visit: No Completed: Has Dressing in Place as Prescribed: Yes Patient Requires Transmission-Based No Precautions: Pain Present Now: Yes Patient Has Alerts: Yes Patient Alerts: DM II unable to get ABI inaudib Electronic Signature(s) Signed: 06/07/2017 4:30:35 PM By: Alric Quan Entered By: Alric Quan on 06/05/2017 14:21:40 [place], [place]. ([id number]) -------------------------------------------------------------------------------- Clinic Level of Care Assessment Details Patient Name: [name], [name] Date of Service: 06/05/2017 2:30 PM Medical Record Number: [id number] Patient Account Number: [id number] Date of Birth/Sex: 02/05/1930 (84 y.o. Female) Treating RN: Roger Shelter Primary Care Morrill Bomkamp: [name] Other Clinician: Referring Keirstan Iannello: [name] Treating Andren Bethea/Extender: Melburn Hake, HOYT Weeks in Treatment: 3 Clinic Level of Care Assessment Items TOOL 4 Quantity Score []   - Use when only an EandM is performed on FOLLOW-UP visit 0 ASSESSMENTS - Nursing Assessment / Reassessment []  - Reassessment of Co-morbidities (includes updates in patient status) 0 X- 1 5 Reassessment of Adherence to Treatment Plan ASSESSMENTS - Wound and Skin Assessment / Reassessment X - Simple Wound Assessment / Reassessment - one wound 1 5 []  - 0 Complex Wound Assessment / Reassessment - multiple wounds []  - 0 Dermatologic / Skin Assessment (not related to wound area) ASSESSMENTS - Focused Assessment []  - Circumferential Edema Measurements - multi extremities 0 []  - 0 Nutritional Assessment / Counseling / Intervention []  - 0 Lower Extremity Assessment (monofilament, tuning fork, pulses) []  - 0 Peripheral Arterial Disease Assessment (using hand held doppler) ASSESSMENTS - Ostomy and/or Continence Assessment and Care []  - Incontinence Assessment and Management 0 []  - 0 Ostomy Care Assessment and Management (repouching, etc.) PROCESS - Coordination of Care X - Simple Patient / Family Education for ongoing care 1 15 []  - 0 Complex (extensive) Patient / Family Education for ongoing care []  - 0 Staff obtains Programmer, systems, Records, Test Results / Process Orders []  - 0 Staff telephones HHA, Nursing Homes / Clarify orders / etc []  - 0 Routine Transfer to another Facility (non-emergent condition) []  - 0 Routine Hospital Admission (non-emergent condition) []  - 0 New Admissions / Biomedical engineer / Ordering NPWT, Apligraf, etc. []  - 0 Emergency Hospital Admission (emergent condition) X- 1 10 Simple Discharge Coordination Korpela, [name] ([id number]) []  - 0 Complex (extensive) Discharge Coordination PROCESS - Special Needs []  - Pediatric / Minor Patient Management 0 []  - 0 Isolation Patient Management []  - 0 Hearing / Language / Visual special needs []  - 0 Assessment of Community assistance (transportation, D/C planning, etc.) []  - 0 Additional assistance / Altered  mentation []  - 0 Support Surface(s) Assessment (bed, cushion, seat, etc.) INTERVENTIONS - Wound Cleansing / Measurement X - Simple Wound Cleansing - one wound 1 5 []  - 0 Complex Wound  Cleansing - multiple wounds X- 1 5 Wound Imaging (photographs - any number of wounds) []  - 0 Wound Tracing (instead of photographs) X- 1 5 Simple Wound Measurement - one wound []  - 0 Complex Wound Measurement - multiple wounds INTERVENTIONS - Wound Dressings X - Small Wound Dressing one or multiple wounds 1 10 []  - 0 Medium Wound Dressing one or multiple wounds []  - 0 Large Wound Dressing one or multiple wounds []  - 0 Application of Medications - topical []  - 0 Application of Medications - injection INTERVENTIONS - Miscellaneous []  - External ear exam 0 []  - 0 Specimen Collection (cultures, biopsies, blood, body fluids, etc.) []  - 0 Specimen(s) / Culture(s) sent or taken to Lab for analysis []  - 0 Patient Transfer (multiple staff / Civil Service fast streamer / Similar devices) []  - 0 Simple Staple / Suture removal (25 or less) []  - 0 Complex Staple / Suture removal (26 or more) []  - 0 Hypo / Hyperglycemic Management (close monitor of Blood Glucose) []  - 0 Ankle / Brachial Index (ABI) - do not check if billed separately X- 1 5 Vital Signs [name], [name] ([id number]) Has the patient been seen at the hospital within the last three years: Yes Total Score: 65 Level Of Care: New/Established - Level 2 Electronic Signature(s) Signed: 06/05/2017 4:14:37 PM By: Roger Shelter Entered By: Roger Shelter on 06/05/2017 14:45:02 [name], [name] ([id number]) -------------------------------------------------------------------------------- Encounter Discharge Information Details Patient Name: [name]. Date of Service: 06/05/2017 2:30 PM Medical Record Number: [id number] Patient Account Number: [id number] Date of Birth/Sex: 1929/08/07 (84 y.o. Female) Treating RN: Roger Shelter Primary Care  Milo Schreier: [name] Other Clinician: Referring Shannel Zahm: [name] Treating Jayce Kainz/Extender: Melburn Hake, HOYT Weeks in Treatment: 3 Encounter Discharge Information Items Discharge Pain Level: 0 Discharge Condition: Stable Ambulatory Status: Wheelchair Discharge Destination: Home Private Transportation: Auto Accompanied By: daughter Schedule Follow-up Appointment: Yes Medication Reconciliation completed and provided No to Patient/Care Helaman [place]: Clinical Summary of Care: Electronic Signature(s) Signed: 06/05/2017 4:14:37 PM By: Roger Shelter Entered By: Roger Shelter on 06/05/2017 15:01:56 [place], [place]. ([id number]) -------------------------------------------------------------------------------- Lower Extremity Assessment Details Patient Name: [name], Nevayah [name]. Date of Service: 06/05/2017 2:30 PM Medical Record Number: [id number] Patient Account Number: [id number] Date of Birth/Sex: [birth year] (84 y.o. Female) Treating RN: Carolyne Fiscal, Debi Primary Care Spenser Cong: [name] Other Clinician: Referring Bryce Cheever: [name] Treating Anay Walter/Extender: Melburn Hake, HOYT Weeks in Treatment: 3 Vascular Assessment Pulses: Dorsalis Pedis Palpable: [Right:No] Doppler Audible: [Right:Yes] Posterior Tibial Extremity colors, hair growth, and conditions: Extremity Color: [Right:Normal] Temperature of Extremity: [Right:Warm] Capillary Refill: [Right:< 3 seconds] Electronic Signature(s) Signed: 06/07/2017 4:30:35 PM By: Alric Quan Entered By: Alric Quan on 06/05/2017 14:30:04 [place], [place]. ([id number]) -------------------------------------------------------------------------------- Multi Wound Chart Details Patient Name: [name], Saher [name]. Date of Service: 06/05/2017 2:30 PM Medical Record Number: [id number] Patient Account Number: [id number] Date of Birth/Sex: 1929/10/02 (84 y.o. Female) Treating RN: Roger Shelter Primary Care Emara Lichter: [name] Other  Clinician: Referring Archibald Marchetta: [name] Treating Ronnald Shedden/Extender: Melburn Hake, HOYT Weeks in Treatment: 3 Vital Signs Height(in): 63 Pulse(bpm): 62 Weight(lbs): Blood Pressure(mmHg): 105/66 Body Mass Index(BMI): Temperature(F): 98.2 Respiratory Rate 16 (breaths/min): Photos: [1:No Photos] [N/A:N/A] Wound Location: [1:Right Calcaneus] [N/A:N/A] Wounding Event: [1:Pressure Injury] [N/A:N/A] Primary Etiology: [1:Pressure Ulcer] [N/A:N/A] Comorbid History: [1:Cataracts, Anemia, Congestive Heart Failure, Myocardial Infarction, Type II Diabetes, Osteoarthritis, Neuropathy] [N/A:N/A] Date Acquired: [1:03/11/2017] [N/A:N/A] Weeks of Treatment: [1:3] [N/A:N/A] Wound Status: [1:Open] [N/A:N/A] Measurements L x W x D [1:1x2.4x0.5] [N/A:N/A] (cm) Area (cm) : [1:1.885] [N/A:N/A] Volume (cm) : [  1:0.942] [N/A:N/A] % Reduction in Area: [1:38.50%] [N/A:N/A] % Reduction in Volume: [1:-2.50%] [N/A:N/A] Classification: [1:Category/Stage III] [N/A:N/A] Exudate Amount: [1:Large] [N/A:N/A] Exudate Type: [1:Serosanguineous] [N/A:N/A] Exudate Color: [1:red, brown] [N/A:N/A] Wound Margin: [1:Distinct, outline attached] [N/A:N/A] Granulation Amount: [1:Large (67-100%)] [N/A:N/A] Granulation Quality: [1:Red] [N/A:N/A] Necrotic Amount: [1:Small (1-33%)] [N/A:N/A] Exposed Structures: [1:Fascia: No Fat Layer (Subcutaneous Tissue) Exposed: No Tendon: No Muscle: No Joint: No Bone: No] [N/A:N/A] Epithelialization: [1:None] [N/A:N/A] Periwound Skin Texture: [1:No Abnormalities Noted] [N/A:N/A] Periwound Skin Moisture: [1:Maceration: Yes] [N/A:N/A] Periwound Skin Color: [1:No Abnormalities Noted] [N/A:N/A] Temperature: No Abnormality N/A N/A Tenderness on Palpation: Yes N/A N/A Wound Preparation: Ulcer Cleansing: N/A N/A Rinsed/Irrigated with Saline Topical Anesthetic Applied: Other: lidocaine 4% Treatment Notes Electronic Signature(s) Signed: 06/05/2017 4:14:37 PM By: Roger Shelter Entered By: Roger Shelter on 06/05/2017 14:39:50 [place], [place]. ([id number]) -------------------------------------------------------------------------------- Multi-Disciplinary Care Plan Details Patient Name: [name], [name] Date of Service: 06/05/2017 2:30 PM Medical Record Number: [id number] Patient Account Number: [id number] Date of Birth/Sex: [birth year] (84 y.o. Female) Treating RN: Roger Shelter Primary Care Kruz Chiu: [name] Other Clinician: Referring Latreshia Beauchaine: [name] Treating Rosie Golson/Extender: Melburn Hake, HOYT Weeks in Treatment: 3 Active Inactive ` Abuse / Safety / Falls / Self Care Management Nursing Diagnoses: History of Falls Potential for falls Goals: Patient will not experience any injury related to falls Date Initiated: 05/09/2017 Target Resolution Date: 08/19/2017 Goal Status: Active Interventions: Assess fall risk on admission and as needed Assess: immobility, friction, shearing, incontinence upon admission and as needed Assess impairment of mobility on admission and as needed per policy Assess personal safety and home safety (as indicated) on admission and as needed Assess self care needs on admission and as needed Provide education on basic hygiene Provide education on fall prevention Treatment Activities: Education provided on Basic Hygiene : 05/09/2017 Notes: ` Nutrition Nursing Diagnoses: Imbalanced nutrition Impaired glucose control: actual or potential Potential for alteratiion in Nutrition/Potential for imbalanced nutrition Goals: Patient/caregiver agrees to and verbalizes understanding of need to use nutritional supplements and/or vitamins as prescribed Date Initiated: 05/09/2017 Target Resolution Date: 08/19/2017 Goal Status: Active Patient/caregiver will maintain therapeutic glucose control Date Initiated: 05/09/2017 Target Resolution Date: 08/19/2017 Goal Status: Active [name], [name] ([id number]) Interventions: Assess  patient nutrition upon admission and as needed per policy Provide education on elevated blood sugars and impact on wound healing Provide education on nutrition Treatment Activities: Education provided on Nutrition : 05/09/2017 Notes: ` Orientation to the Wound Care Program Nursing Diagnoses: Knowledge deficit related to the wound healing center program Goals: Patient/caregiver will verbalize understanding of the [place] Date Initiated: 05/09/2017 Target Resolution Date: 05/20/2017 Goal Status: Active Interventions: Provide education on orientation to the wound center Notes: ` Pain, Acute or Chronic Nursing Diagnoses: Pain, acute or chronic: actual or potential Potential alteration in comfort, pain Goals: Patient/caregiver will verbalize adequate pain control between visits Date Initiated: 05/09/2017 Target Resolution Date: 08/19/2017 Goal Status: Active Interventions: Complete pain assessment as per visit requirements Encourage patient to take pain medications as prescribed Implement pain control techniques (non-pharmaceutical) Notes: ` Pressure Nursing Diagnoses: Knowledge deficit related to causes and risk factors for pressure ulcer development Knowledge deficit related to management of pressures ulcers Potential for impaired tissue integrity related to pressure, friction, moisture, and shear [name], [name] ([id number]) Goals: Patient will remain free from development of additional pressure ulcers Date Initiated: 05/09/2017 Target Resolution Date: 08/19/2017 Goal Status: Active Interventions: Assess: immobility, friction, shearing, incontinence upon admission and as needed Assess offloading mechanisms upon admission and as  needed Assess potential for pressure ulcer upon admission and as needed Provide education on pressure ulcers Notes: ` Wound/Skin Impairment Nursing Diagnoses: Impaired tissue integrity Knowledge deficit related to  ulceration/compromised skin integrity Goals: Ulcer/skin breakdown will have a volume reduction of 80% by week 12 Date Initiated: 05/09/2017 Target Resolution Date: 08/19/2017 Goal Status: Active Interventions: Assess patient/caregiver ability to perform ulcer/skin care regimen upon admission and as needed Assess ulceration(s) every visit Notes: Electronic Signature(s) Signed: 06/05/2017 4:14:37 PM By: Roger Shelter Entered By: Roger Shelter on 06/05/2017 14:39:09 Szafran, [place]. ([id number]) -------------------------------------------------------------------------------- Pain Assessment Details Patient Name: [name]. Date of Service: 06/05/2017 2:30 PM Medical Record Number: [id number] Patient Account Number: [id number] Date of Birth/Sex: 1929-10-17 (84 y.o. Female) Treating RN: Carolyne Fiscal, Debi Primary Care Dona Walby: [name] Other Clinician: Referring Raynetta Osterloh: [name] Treating Sammie Schermerhorn/Extender: Melburn Hake, HOYT Weeks in Treatment: 3 Active Problems Location of Pain Severity and Description of Pain Patient Has Paino Yes Site Locations Pain Location: Pain in Ulcers Rate the pain. Current Pain Level: 5 Character of Pain Describe the Pain: Shooting Pain Management and Medication Current Pain Management: Notes Topical or injectable lidocaine is offered to patient for acute pain when surgical debridement is performed. If needed, Patient is instructed to use over the counter pain medication for the following 24-48 hours after debridement. Wound care MDs do not prescribed pain medications. Patient has chronic pain or uncontrolled pain. Patient has been instructed to make an appointment with their Primary Care Physician for pain management. Electronic Signature(s) Signed: 06/07/2017 4:30:35 PM By: Alric Quan Entered By: Alric Quan on 06/05/2017 14:22:57 [name], [name]  ([id number]) -------------------------------------------------------------------------------- Patient/Caregiver Education Details Patient Name: [name]. Date of Service: 06/05/2017 2:30 PM Medical Record Number: [id number] Patient Account Number: [id number] Date of Birth/Gender: [birth year] (84 y.o. Female) Treating RN: Roger Shelter Primary Care Physician: [name] Other Clinician: Referring Physician: Eliezer [name] Treating Physician/Extender: Sharalyn Ink in Treatment: 3 Education Assessment Education Provided To: Patient Education Topics Provided Wound/Skin Impairment: Methods: Explain/Verbal Responses: State content correctly Electronic Signature(s) Signed: 06/05/2017 4:14:37 PM By: Roger Shelter Entered By: Roger Shelter on 06/05/2017 15:02:09 [name], [place]. ([id number]) -------------------------------------------------------------------------------- Wound Assessment Details Patient Name: [name], Debhora [name]. Date of Service: 06/05/2017 2:30 PM Medical Record Number: [id number] Patient Account Number: [id number] Date of Birth/Sex: 22-Jan-1930 (84 y.o. Female) Treating RN: Carolyne Fiscal, Debi Primary Care Luisfernando Brightwell: [name] Other Clinician: Referring Emelly Wurtz: [name] Treating Jacqualyn Sedgwick/Extender: Melburn Hake, HOYT Weeks in Treatment: 3 Wound Status Wound Number: 1 Primary Pressure Ulcer Etiology: Wound Location: Right Calcaneus Wound Open Wounding Event: Pressure Injury Status: Date Acquired: 03/11/2017 Comorbid Cataracts, Anemia, Congestive Heart Failure, Weeks Of Treatment: 3 History: Myocardial Infarction, Type II Diabetes, Clustered Wound: No Osteoarthritis, Neuropathy Photos Photo Uploaded By: Roger Shelter on 06/05/2017 16:11:27 Wound Measurements Length: (cm) 1 Width: (cm) 2.4 Depth: (cm) 0.5 Area: (cm) 1.885 Volume: (cm) 0.942 % Reduction in Area: 38.5% % Reduction in Volume: -2.5% Epithelialization: None Tunneling:  No Undermining: No Wound Description Classification: Category/Stage III Wound Margin: Distinct, outline attached Exudate Amount: Large Exudate Type: Serosanguineous Exudate Color: red, brown Slough/Fibrino Yes Wound Bed Granulation Amount: Large (67-100%) Exposed Structure Granulation Quality: Red Fascia Exposed: No Necrotic Amount: Small (1-33%) Fat Layer (Subcutaneous Tissue) Exposed: No Necrotic Quality: Adherent Slough Tendon Exposed: No Muscle Exposed: No Joint Exposed: No Bone Exposed: No Periwound Skin Texture Konkle, Luva [name]. ([id number]) Texture Color No Abnormalities Noted: No No Abnormalities Noted: No Moisture Temperature / Pain No Abnormalities Noted: No Temperature: No  Abnormality Maceration: Yes Tenderness on Palpation: Yes Wound Preparation Ulcer Cleansing: Rinsed/Irrigated with Saline Topical Anesthetic Applied: Other: lidocaine 4%, Treatment Notes Wound #1 (Right Calcaneus) 1. Cleansed with: Clean wound with Normal Saline 2. Anesthetic Topical Lidocaine 4% cream to wound bed prior to debridement 4. Dressing Applied: Santyl Ointment 5. Secondary Dressing Applied Non-Adherent pad Notes allevyn heel cup, kerlix wrap, netting Electronic Signature(s) Signed: 06/07/2017 4:30:35 PM By: Alric Quan Entered By: Alric Quan on 06/05/2017 14:28:19 [name], [name] ([id number]) -------------------------------------------------------------------------------- Vitals Details Patient Name: [name], Arlee [name]. Date of Service: 06/05/2017 2:30 PM Medical Record Number: [id number] Patient Account Number: [id number] Date of Birth/Sex: 02/12/1930 (84 y.o. Female) Treating RN: Carolyne Fiscal, Debi Primary Care Anneli Bing: [name] Other Clinician: Referring Raider Valbuena: [name] Treating Kamaal Cast/Extender: Melburn Hake, HOYT Weeks in Treatment: 3 Vital Signs Time Taken: 14:23 Temperature (F): 98.2 Height (in): 63 Pulse (bpm): 78 Respiratory Rate  (breaths/min): 16 Blood Pressure (mmHg): 105/66 Reference Range: 80 - 120 mg / dl Electronic Signature(s) Signed: 06/07/2017 4:30:35 PM By: Alric Quan Entered By: Alric Quan on 06/05/2017 14:23:16

## 2017-06-12 ENCOUNTER — Encounter: Payer: PPO | Admitting: Physician Assistant

## 2017-06-14 ENCOUNTER — Encounter (HOSPITAL_BASED_OUTPATIENT_CLINIC_OR_DEPARTMENT_OTHER): Payer: PPO | Attending: Physician Assistant

## 2017-06-14 DIAGNOSIS — E1140 Type 2 diabetes mellitus with diabetic neuropathy, unspecified: Secondary | ICD-10-CM | POA: Insufficient documentation

## 2017-06-14 DIAGNOSIS — I252 Old myocardial infarction: Secondary | ICD-10-CM | POA: Insufficient documentation

## 2017-06-14 DIAGNOSIS — Z794 Long term (current) use of insulin: Secondary | ICD-10-CM | POA: Diagnosis not present

## 2017-06-14 DIAGNOSIS — R54 Age-related physical debility: Secondary | ICD-10-CM | POA: Insufficient documentation

## 2017-06-14 DIAGNOSIS — I70234 Atherosclerosis of native arteries of right leg with ulceration of heel and midfoot: Secondary | ICD-10-CM | POA: Insufficient documentation

## 2017-06-14 DIAGNOSIS — L89613 Pressure ulcer of right heel, stage 3: Secondary | ICD-10-CM | POA: Insufficient documentation

## 2017-06-14 DIAGNOSIS — D46B Refractory cytopenia with multilineage dysplasia and ring sideroblasts: Secondary | ICD-10-CM | POA: Diagnosis present

## 2017-06-15 DIAGNOSIS — L97519 Non-pressure chronic ulcer of other part of right foot with unspecified severity: Secondary | ICD-10-CM | POA: Diagnosis not present

## 2017-06-19 ENCOUNTER — Encounter: Payer: PPO | Admitting: Physician Assistant

## 2017-06-20 ENCOUNTER — Other Ambulatory Visit: Payer: Self-pay | Admitting: Family Medicine

## 2017-06-20 ENCOUNTER — Encounter: Payer: Self-pay | Admitting: *Deleted

## 2017-06-20 MED ORDER — CARVEDILOL 6.25 MG PO TABS: 6.2500 mg | ORAL_TABLET | Freq: Two times a day (BID) | ORAL | 1 refills | Status: DC

## 2017-06-20 MED ORDER — FUROSEMIDE 20 MG PO TABS: 20.0000 mg | ORAL_TABLET | Freq: Every day | ORAL | 1 refills | Status: DC

## 2017-06-20 MED ORDER — LOSARTAN POTASSIUM 25 MG PO TABS: 25.0000 mg | ORAL_TABLET | Freq: Every day | ORAL | 1 refills | Status: DC

## 2017-06-20 NOTE — Telephone Encounter (Signed)
Copied from [place] [id number]. Topic: Quick Communication - Rx Refill/Question >> Jun 20, 2017  9:31 AM Scherrie Gerlach wrote: Medication: carvedilol (COREG) 6.25 MG tablet (Expired)                      furosemide (LASIX) 20 MG tablet                      losartan (COZAAR) 25 MG tablet (Expired)  Daughter states she called the pharmacy and they sent to another dr. (?) Also pt is out, and they need a refill until she can get in to a heart doctor.  [facility] [address], [place] [phone] (Phone) [phone] (Fax)

## 2017-06-20 NOTE — Telephone Encounter (Signed)
Patient requesting refill extension of following medications due to change in  vascular providers- patient has up coming appointment in April.  LOV: 05/16/17   PCP: Diona Browner   Pharmacy: verified

## 2017-06-20 NOTE — Telephone Encounter (Signed)
Refills sent as requested to [facility] [place].  Debbie notified by telephone.

## 2017-06-20 NOTE — Telephone Encounter (Signed)
This encounter was created in error - please disregard.

## 2017-06-21 DIAGNOSIS — L89613 Pressure ulcer of right heel, stage 3: Secondary | ICD-10-CM | POA: Diagnosis not present

## 2017-06-26 ENCOUNTER — Encounter: Payer: PPO | Admitting: Physician Assistant

## 2017-06-28 DIAGNOSIS — L89613 Pressure ulcer of right heel, stage 3: Secondary | ICD-10-CM | POA: Diagnosis not present

## 2017-07-03 ENCOUNTER — Encounter: Payer: PPO | Admitting: Physician Assistant

## 2017-07-07 ENCOUNTER — Encounter: Payer: PPO | Attending: Nurse Practitioner | Admitting: Physician Assistant

## 2017-07-07 DIAGNOSIS — G629 Polyneuropathy, unspecified: Secondary | ICD-10-CM | POA: Diagnosis not present

## 2017-07-07 DIAGNOSIS — L89613 Pressure ulcer of right heel, stage 3: Secondary | ICD-10-CM | POA: Insufficient documentation

## 2017-07-07 DIAGNOSIS — R54 Age-related physical debility: Secondary | ICD-10-CM | POA: Diagnosis not present

## 2017-07-07 DIAGNOSIS — Z96649 Presence of unspecified artificial hip joint: Secondary | ICD-10-CM | POA: Diagnosis not present

## 2017-07-07 DIAGNOSIS — I70201 Unspecified atherosclerosis of native arteries of extremities, right leg: Secondary | ICD-10-CM | POA: Insufficient documentation

## 2017-07-07 DIAGNOSIS — Z87891 Personal history of nicotine dependence: Secondary | ICD-10-CM | POA: Insufficient documentation

## 2017-07-07 DIAGNOSIS — I252 Old myocardial infarction: Secondary | ICD-10-CM | POA: Diagnosis not present

## 2017-07-09 NOTE — Progress Notes (Signed)
EDLA, PARA ([id number]) Visit Report for 07/07/2017 Abuse/Suicide Risk Screen Details Patient Name: ODEAN, MCELWAIN. Date of Service: 07/07/2017 12:30 PM Medical Record Number: [id number] Patient Account Number: [id number] Date of Birth/Sex: February 11, 1930 (84 y.o. F) Treating RN: Roger Shelter Primary Care Braylin Xu: Eliezer Lofts Other Clinician: Referring Irmgard Rampersaud: Eliezer Lofts Treating Johnjoseph Rolfe/Extender: Melburn Hake, HOYT Weeks in Treatment: 0 Abuse/Suicide Risk Screen Items Answer ABUSE/SUICIDE RISK SCREEN: Has anyone close to you tried to hurt or harm you recentlyo No Do you feel uncomfortable with anyone in your familyo No Has anyone forced you do things that you didnot want to doo No Do you have any thoughts of harming yourselfo No Patient displays signs or symptoms of abuse and/or neglect. No Electronic Signature(s) Signed: 07/07/2017 4:26:13 PM By: Roger Shelter Entered By: Roger Shelter on 07/07/2017 12:45:12 Michon, Ahmoni E. ([id number]) -------------------------------------------------------------------------------- Activities of Daily Living Details Patient Name: JERRA, HUCKEBY E. Date of Service: 07/07/2017 12:30 PM Medical Record Number: [id number] Patient Account Number: [id number] Date of Birth/Sex: [birth year] (84 y.o. F) Treating RN: Roger Shelter Primary Care Kasee Hantz: Eliezer Lofts Other Clinician: Referring Adorian Gwynne: Eliezer Lofts Treating Maidie Streight/Extender: Melburn Hake, HOYT Weeks in Treatment: 0 Activities of Daily Living Items Answer Activities of Daily Living (Please select one for each item) Drive Automobile Need Assistance Take Medications Need Assistance Use Telephone Need Assistance Care for Appearance Need Assistance Use Toilet Need Assistance Bath / Shower Need Assistance Dress Self Need Assistance Feed Self Need Assistance Walk Need Assistance Get In / Out Bed Completely [place] Need Assistance Shop for Self Need Assistance Electronic Signature(s) Signed: 07/07/2017 4:26:13 PM By: Roger Shelter Entered By: Roger Shelter on 07/07/2017 12:45:45 [place], Bobbye E. ([id number]) -------------------------------------------------------------------------------- Education Assessment Details Patient Name: [name] Date of Service: 07/07/2017 12:30 PM Medical Record Number: [id number] Patient Account Number: [id number] Date of Birth/Sex: [birth year] (84 y.o. F) Treating RN: Roger Shelter Primary Care Florinda Taflinger: Eliezer Lofts Other Clinician: Referring Jamyron Redd: Eliezer Lofts Treating Yandell Mcjunkins/Extender: Melburn Hake, HOYT Weeks in Treatment: 0 Primary Learner Assessed: Patient Learning Preferences/Education Level/Primary Language Learning Preference: Explanation Highest Education Level: High School Preferred Language: English Cognitive Barrier Assessment/Beliefs Language Barrier: No Translator Needed: No Memory Deficit: No Emotional Barrier: No Cultural/Religious Beliefs Affecting Medical Care: No Physical Barrier Assessment Impaired Vision: Yes Glasses Impaired Hearing: No Decreased Hand dexterity: No Knowledge/Comprehension Assessment Knowledge Level: High Comprehension Level: High Ability to understand written High instructions: Ability to understand verbal High instructions: Motivation Assessment Anxiety Level: Calm Cooperation: Cooperative Education Importance: Acknowledges Need Interest in Health Problems: Asks Questions Perception: Coherent Willingness to Engage in Self- High Management Activities: Readiness to Engage in Self- High Management Activities: Electronic Signature(s) Signed: 07/07/2017 4:26:13 PM By: Roger Shelter Entered By: Roger Shelter on 07/07/2017 12:46:20 [place], Safari E. ([id number]) -------------------------------------------------------------------------------- Fall Risk Assessment Details Patient Name:  [name], Shandell E. Date of Service: 07/07/2017 12:30 PM Medical Record Number: [id number] Patient Account Number: [id number] Date of Birth/Sex: 1929-08-03 (84 y.o. F) Treating RN: Roger Shelter Primary Care Jabre Heo: Eliezer Lofts Other Clinician: Referring Lucky Trotta: Eliezer Lofts Treating Jachai Okazaki/Extender: Melburn Hake, HOYT Weeks in Treatment: 0 Fall Risk Assessment Items Have you had 2 or more falls in the last 12 monthso 0 No Have you had any fall that resulted in injury in the last 12 monthso 0 No FALL RISK ASSESSMENT: History of falling - immediate or within 3 months 0 No Secondary diagnosis 0 No Ambulatory aid None/bed rest/wheelchair/nurse 0 No Crutches/cane/walker 0 No Furniture 0  No IV Access/Saline Lock 0 No Gait/Training Normal/bed rest/immobile 0 No Weak 0 No Impaired 0 No Mental Status Oriented to own ability 0 No Electronic Signature(s) Signed: 07/07/2017 4:26:13 PM By: Roger Shelter Entered By: Roger Shelter on 07/07/2017 12:46:47 Pals, Heli E. ([id number]) -------------------------------------------------------------------------------- Nutrition Risk Assessment Details Patient Name: [name], Kyleigha E. Date of Service: 07/07/2017 12:30 PM Medical Record Number: [id number] Patient Account Number: [id number] Date of Birth/Sex: [birth year] (84 y.o. F) Treating RN: Roger Shelter Primary Care Niley Helbig: Eliezer Lofts Other Clinician: Referring Theona Muhs: Eliezer Lofts Treating Jachelle Fluty/Extender: Melburn Hake, HOYT Weeks in Treatment: 0 Height (in): 63 Weight (lbs): 139 Body Mass Index (BMI): 24.6 Nutrition Risk Assessment Items NUTRITION RISK SCREEN: I have an illness or condition that made me change the kind and/or amount of 0 No food I eat I eat fewer than two meals per day 0 No I eat few fruits and vegetables, or milk products 0 No I have three or more drinks of beer, liquor or wine almost every day 0 No I have tooth or mouth problems that make it hard for  me to eat 0 No I don't always have enough money to buy the food I need 0 No I eat alone most of the time 0 No I take three or more different prescribed or over-the-counter drugs a day 0 No Without wanting to, I have lost or gained 10 pounds in the last six months 0 No I am not always physically able to shop, cook and/or feed myself 0 No Nutrition Protocols Good Risk Protocol 0 No interventions needed Moderate Risk Protocol Electronic Signature(s) Signed: 07/07/2017 4:26:13 PM By: Roger Shelter Entered By: Roger Shelter on 07/07/2017 12:46:58

## 2017-07-09 NOTE — Progress Notes (Signed)
[name], [name] ([id number]) Visit Report for 07/07/2017 Chief Complaint Document Details Patient Name: [name], [name] Date of Service: 07/07/2017 12:30 PM Medical Record Number: [id number] Patient Account Number: [id number] Date of Birth/Sex: 1930-03-10 (84 y.o. F) Treating RN: Montey [name] Primary Care Provider: Eliezer Lofts Other Clinician: Referring Provider: Eliezer Lofts Treating Provider/Extender: Melburn Hake, Aleayah Chico Weeks in Treatment: 0 Information Obtained from: Patient Chief Complaint she presents for initial evaluation of a right posterior heel pressure ulcer Electronic Signature(s) Signed: 07/07/2017 6:18:22 PM By: Worthy Keeler PA-C Entered By: Worthy Keeler on 07/07/2017 12:46:12 [name], [name] ([id number]) -------------------------------------------------------------------------------- Debridement Details Patient Name: [name] Reining E. Date of Service: 07/07/2017 12:30 PM Medical Record Number: [id number] Patient Account Number: [id number] Date of Birth/Sex: 02-05-1930 (84 y.o. F) Treating RN: Montey [name] Primary Care Provider: Eliezer Lofts Other Clinician: Referring Provider: Eliezer Lofts Treating Provider/Extender: Melburn Hake, Jaeshaun Riva Weeks in Treatment: 0 Debridement Performed for Wound #2 Right Calcaneus Assessment: Performed By: Physician STONE III, Pallavi Clifton E., PA-C Debridement Type: Debridement Pre-procedure Verification/Time Yes - 12:56 Out Taken: Start Time: 12:56 Pain Control: Lidocaine 4% Topical Solution Total Area Debrided (L x W): 2 (cm) x 2.1 (cm) = 4.2 (cm) Tissue and other material Viable, Non-Viable, Subcutaneous, Biofilm, Other: dressing material debrided: Level: Skin/Subcutaneous Tissue Debridement Description: Excisional Instrument: Curette Bleeding: Minimum Hemostasis Achieved: Pressure End Time: 12:58 Procedural Pain: 0 Post Procedural Pain: 0 Response to Treatment: Procedure was tolerated well Post Debridement Measurements of  Total Wound Length: (cm) 1 Stage: Category/Stage III Width: (cm) 2.1 Depth: (cm) 0.2 Volume: (cm) 0.33 Character of Wound/Ulcer Post Improved Debridement: Post Procedure Diagnosis Same as Pre-procedure Electronic Signature(s) Signed: 07/07/2017 4:53:17 PM By: Montey [name] Signed: 07/07/2017 6:18:22 PM By: Worthy Keeler PA-C Entered By: Montey [name] on 07/07/2017 12:59:02 Coppess, Lindsey. ([id number]) -------------------------------------------------------------------------------- HPI Details Patient Name: [name], [name] E. Date of Service: 07/07/2017 12:30 PM Medical Record Number: [id number] Patient Account Number: [id number] Date of Birth/Sex: May 10, 1929 (84 y.o. F) Treating RN: Montey [name] Primary Care Provider: Eliezer Lofts Other Clinician: Referring Provider: Eliezer Lofts Treating Provider/Extender: Melburn Hake, Ruchama Kubicek Weeks in Treatment: 0 History of Present Illness Location: right posterior heel Quality: intermittent burning and sharp pains Context: recurrent pressure Modifying Factors: prolonged and unrelieved pressure delay wound healing HPI Description: 05/09/17-she is here for initial evaluation of a right posterior heel ulcer, accompanied by her daughter. She was hospitalized in November for right hip fracture and repair. 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 She states that during her hospitalization she developed multiple pressure ulcers, the only remaining is the posterior right heel. Home health has been following. She has applied a variety of ointments including Vaseline and Desitin to the right heel. She states that approximately 4 years ago she followed with Dr. Oneida Alar at [facility] secondary to claudication. She states there is "nothing they can do" and denies ever having intervention. She is a former smoker, quitting >30 years ago She is a diabetic, her last A1c was 6.6 in  December. ABIs in the office were unable to be obtained. There is soft lifted eschar to the ulcer which has been selectively debrided. I recommended a second opinion from vascular medicine, the would prefer to stay local, therefore a referral to [facility] has been sent. We will apply Santyl daily; she has been advised to contact the clinic as soon as possible if she is unable to afford the medication. She  has been educated on the need for aggressive offloading to the right heel. 05/19/17 on evaluation today patient appears to be doing rather well in regard to her right posterior heel ulcer. She has been tolerating the dressing changes without complication. With that being said I am very pleased with how things have been going at this point in time from what I see at this point. Patient likewise is happy with the progress more specifically her daughter is to has been taking care of this ulcer in particular. She has been using Santyl currently with good result. 05/26/17 on evaluation today patient appears to be doing about the same in regard to her ulcer at this point. She has been tolerating the dressing changes. We have been using Santyl to help clean up the ulcer bed. She does have her appointment with [facility] on 05/30/17. Hopefully they will be able to determine her blood flow and whether any intervention may be necessary or not. With that being said she fortunately is doing well not having significant pain at this time. 06/05/17 on evaluation today patient appears to be doing much better in regard to her right heel wound. She has been tolerating the dressing changes without complication. She is continuing to utilize the [place] at this point and this does seem to be doing very well for her. Overall I'm pleased with the progress that she has made in the interim from last week to this week she has much more granulation apparent in the base of the wound and there does  not appear to be any evidence of infection. Her daughter has been doing very well caring for and cleaning the wound. Admission to [facility]: 06/14/17 on evaluation today patient actually appears to be doing excellent in regard to her right heel ulcer. It appears to be more and shows excellent signs of improvement since last week's evaluation. She has been tolerating the dressing changes well other than the fact that she thought the Santyl might be causing some burning although we switch to hydrogen when she still experience the burning. I think this may be more of an underlying arterial issue or potentially just a neuropathy type issue. 06/21/17 on evaluation today patient appears to be doing very well in regard to her wound. She is making good progress as far as evaluation is concerned at this point. She has been tolerating the dressing changes with the silver collagen without any complication. Fortunately there appears to be no evidence of infection. She does have her appointment with vascular on April 8 and then the follow up appointment to discuss the results on April 11. Otherwise there does not appear to be any evidence of anything worsening at this time. 06/28/17 on evaluation today patient appears to be doing better in regard to her right heel ulcer. We are seeing more Scaduto, Woodward ([id number]) granulation noted she did have some dry skin which was actually easily removed just with saline and gauze today in the periphery of the wound. After removal this appears to even be better as far as the overall appearance of the wound is concerned. We are still waiting on vascular referral. Readmission back to [facility]: 07/07/17 on evaluation today patient is seen for reevaluation here in the [place] clinic. She is actually been seen in the past three weeks by myself in [employer]. However she was really not happy with the wait time she tells me. They were often having to wait two  and half hours  which really did not make any sense considering how quickly they get in and out of the clinic here they tell me. Therefore they decided to transfer back to the [facility] wound center. Nonetheless she has been doing excellent at this point in time. We have been using Prisma with Drawtex over top and then a heel cup followed by Kerlex to secure in place. She states at this point in time this has been doing very well in fact the wound does appear to be much better today. Electronic Signature(s) Signed: 07/07/2017 6:18:22 PM By: Worthy Keeler PA-C Entered By: Worthy Keeler on 07/07/2017 13:06:22 [name], [name] ([id number]) -------------------------------------------------------------------------------- Physical Exam Details Patient Name: [name], [name] E. Date of Service: 07/07/2017 12:30 PM Medical Record Number: [id number] Patient Account Number: [id number] Date of Birth/Sex: 12/01/1929 (84 y.o. F) Treating RN: Montey [name] Primary Care Provider: Eliezer Lofts Other Clinician: Referring Provider: Eliezer Lofts Treating Provider/Extender: Melburn Hake, Aishia Barkey Weeks in Treatment: 0 Constitutional Well-nourished and well-hydrated in no acute distress. Respiratory normal breathing without difficulty. Psychiatric this patient is able to make decisions and demonstrates good insight into disease process. Alert and Oriented x 3. pleasant and cooperative. Notes At this time patient's wound once I actually debrided away the excess dressing and dry skin appears to be doing much better. She in fact has just a very small opening centrally that is still not epithelial eyes. This shows an excellent granular bed post debridement and overall I'm extremely pleased with the progress that she has made. Fortunately she does not seem to be having as much pain. Electronic Signature(s) Signed: 07/07/2017 6:18:22 PM By: Worthy Keeler PA-C Entered By: Worthy Keeler on 07/07/2017  13:07:03 Branan, Shareese EMarland Kitchen ([id number]) -------------------------------------------------------------------------------- Physician Orders Details Patient Name: AASHRITHA, MIEDEMA E. Date of Service: 07/07/2017 12:30 PM Medical Record Number: [id number] Patient Account Number: [id number] Date of Birth/Sex: [birth year] (84 y.o. F) Treating RN: Montey [name] Primary Care Provider: Eliezer Lofts Other Clinician: Referring Provider: Eliezer Lofts Treating Provider/Extender: Melburn Hake, Rosslyn Pasion Weeks in Treatment: 0 Verbal / Phone Orders: No Diagnosis Coding ICD-10 Coding Code Description L89.613 Pressure ulcer of right heel, stage 3 I70.234 Atherosclerosis of native arteries of right leg with ulceration of heel and midfoot R54 Age-related physical debility Wound Cleansing Wound #2 Right Calcaneus o Clean wound with Normal Saline. o May Shower, gently pat wound dry prior to applying new dressing. Anesthetic (add to Medication List) Wound #2 Right Calcaneus o Topical Lidocaine 4% cream applied to wound bed prior to debridement (In Clinic Only). Primary Wound Dressing Wound #2 Right Calcaneus o Silver Collagen Secondary Dressing Wound #2 Right Calcaneus o Conform/Kerlix o Foam - heel cup o Drawtex Dressing Change Frequency Wound #2 Right Calcaneus o Change dressing every other day. Follow-up Appointments Wound #2 Right Calcaneus o Return Appointment in 1 week. Additional Orders / Instructions Wound #2 Right Calcaneus o Vitamin A; Vitamin C, Zinc - Please add an over the counter multivitamin that has 100% of each of these o Increase protein intake. Electronic Signature(s) ALEXXIS, MACKERT ([id number]) Signed: 07/07/2017 4:53:17 PM By: Montey [name] Signed: 07/07/2017 6:18:22 PM By: Worthy Keeler PA-C Entered By: Montey [name] on 07/07/2017 13:04:07 [place], Lisset E. ([id number]) -------------------------------------------------------------------------------- Problem  List Details Patient Name: [name], [name] E. Date of Service: 07/07/2017 12:30 PM Medical Record Number: [id number] Patient Account Number: [id number] Date of Birth/Sex: 1929/12/28 (84 y.o. F) Treating RN: Montey [name] Primary Care Provider: Eliezer Lofts Other Clinician: Referring Provider: Eliezer Lofts Treating Provider/Extender: Joaquim Lai  III, Marlowe Cinquemani Weeks in Treatment: 0 Active Problems ICD-10 Impacting Encounter Code Description Active Date Wound Healing Diagnosis L89.613 Pressure ulcer of right heel, stage 3 07/07/2017 Yes I70.234 Atherosclerosis of native arteries of right leg with ulceration of 07/07/2017 Yes heel and midfoot R54 Age-related physical debility 07/07/2017 Yes Inactive Problems Resolved Problems Electronic Signature(s) Signed: 07/07/2017 6:18:22 PM By: Worthy Keeler PA-C Entered By: Worthy Keeler on 07/07/2017 12:48:00 Mutch, Jalon E. ([id number]) -------------------------------------------------------------------------------- Progress Note Details Patient Name: [name], [name] E. Date of Service: 07/07/2017 12:30 PM Medical Record Number: [id number] Patient Account Number: [id number] Date of Birth/Sex: [birth year] (84 y.o. F) Treating RN: Montey [name] Primary Care Provider: Eliezer Lofts Other Clinician: Referring Provider: Eliezer Lofts Treating Provider/Extender: Melburn Hake, Kaliana Albino Weeks in Treatment: 0 Subjective Chief Complaint Information obtained from Patient she presents for initial evaluation of a right posterior heel pressure ulcer History of Present Illness (HPI) The following HPI elements were documented for the patient's wound: Location: right posterior heel Quality: intermittent burning and sharp pains Context: recurrent pressure Modifying Factors: prolonged and unrelieved pressure delay wound healing 05/09/17-she is here for initial evaluation of a right posterior heel ulcer, accompanied by her daughter. She was hospitalized in November for right hip  fracture and repair. 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 She states that during her hospitalization she developed multiple pressure ulcers, the only remaining is the posterior right heel. Home health has been following. She has applied a variety of ointments including Vaseline and Desitin to the right heel. She states that approximately 4 years ago she followed with Dr. Oneida Alar at [facility] secondary to claudication. She states there is "nothing they can do" and denies ever having intervention. She is a former smoker, quitting >30 years ago She is a diabetic, her last A1c was 6.6 in December. ABIs in the office were unable to be obtained. There is soft lifted eschar to the ulcer which has been selectively debrided. I recommended a second opinion from vascular medicine, the would prefer to stay local, therefore a referral to [facility] has been sent. We will apply Santyl daily; she has been advised to contact the clinic as soon as possible if she is unable to afford the medication. She has been educated on the need for aggressive offloading to the right heel. 05/19/17 on evaluation today patient appears to be doing rather well in regard to her right posterior heel ulcer. She has been tolerating the dressing changes without complication. With that being said I am very pleased with how things have been going at this point in time from what I see at this point. Patient likewise is happy with the progress more specifically her daughter is to has been taking care of this ulcer in particular. She has been using Santyl currently with good result. 05/26/17 on evaluation today patient appears to be doing about the same in regard to her ulcer at this point. She has been tolerating the dressing changes. We have been using Santyl to help clean up the ulcer bed. She does have her appointment with [facility] on 05/30/17. Hopefully they will be able to determine her blood flow and whether any intervention may be necessary or not. With that being said she fortunately is doing well not having significant pain at this time. 06/05/17 on evaluation today patient appears to be doing much better in regard to her right heel wound. She  has been tolerating the dressing changes without complication. She is continuing to utilize the [place] at this point and this does seem to be doing very well for her. Overall I'm pleased with the progress that she has made in the interim from last week to this week she has much more granulation apparent in the base of the wound and there does not appear to be any evidence of infection. Her daughter has been doing very well caring for and cleaning the wound. Admission to [facility]: 06/14/17 on evaluation today patient actually appears to be doing excellent in regard to her right heel ulcer. It appears to be more and shows excellent signs of improvement since last week's evaluation. She has been tolerating the dressing changes well other than the fact that she thought the Santyl might be causing some burning although we switch to hydrogen when she still experience the burning. I think this may be more of an underlying arterial issue or potentially just a neuropathy type issue. [name], [name] ([id number]) 06/21/17 on evaluation today patient appears to be doing very well in regard to her wound. She is making good progress as far as evaluation is concerned at this point. She has been tolerating the dressing changes with the silver collagen without any complication. Fortunately there appears to be no evidence of infection. She does have her appointment with vascular on April 8 and then the follow up appointment to discuss the results on April 11. Otherwise there does not appear to be any evidence of anything worsening at this time. 06/28/17 on evaluation today patient  appears to be doing better in regard to her right heel ulcer. We are seeing more granulation noted she did have some dry skin which was actually easily removed just with saline and gauze today in the periphery of the wound. After removal this appears to even be better as far as the overall appearance of the wound is concerned. We are still waiting on vascular referral. Readmission back to [facility]: 07/07/17 on evaluation today patient is seen for reevaluation here in the [place] clinic. She is actually been seen in the past three weeks by myself in [place]. However she was really not happy with the wait time she tells me. They were often having to wait two and half hours which really did not make any sense considering how quickly they get in and out of the clinic here they tell me. Therefore they decided to transfer back to the [facility] wound center. Nonetheless she has been doing excellent at this point in time. We have been using Prisma with Drawtex over top and then a heel cup followed by Kerlex to secure in place. She states at this point in time this has been doing very well in fact the wound does appear to be much better today. Wound History Patient presents with 1 open wound that has been present for approximately 3 months. Laboratory tests have not been performed in the last month. Patient reportedly has not tested positive for an antibiotic resistant organism. Patient reportedly has not tested positive for osteomyelitis. Patient reportedly has not had testing performed to evaluate circulation in the legs. Patient History Information obtained from Patient. Allergies codeine, metformin, penicillin, pioglitazone, fenofibrate Family History Cancer - Mother,Child, Diabetes - Child, Hypertension - Child, Thyroid Problems - Child, No family history of Heart Disease, Hereditary Spherocytosis, Kidney Disease, Lung Disease, Seizures, Stroke, Tuberculosis. Social History Former  smoker - quit 50 yrs ago, Marital Status - Widowed, Alcohol  Use - Never, Drug Use - No History, Caffeine Use - Daily. Review of Systems (ROS) Eyes Complains or has symptoms of Glasses / Contacts - glasses. Denies complaints or symptoms of Dry Eyes, Vision Changes. Objective Constitutional Well-nourished and well-hydrated in no acute distress. [name], [name] E. ([id number]) Vitals Time Taken: 12:38 PM, Height: 63 in, Source: Stated, Weight: 139 lbs, Source: Stated, BMI: 24.6, Temperature: 97.8  F, Pulse: 93 bpm, Respiratory Rate: 18 breaths/min, Blood Pressure: 139/81 mmHg. Respiratory normal breathing without difficulty. Psychiatric this patient is able to make decisions and demonstrates good insight into disease process. Alert and Oriented x 3. pleasant and cooperative. General Notes: At this time patient's wound once I actually debrided away the excess dressing and dry skin appears to be doing much better. She in fact has just a very small opening centrally that is still not epithelial eyes. This shows an excellent granular bed post debridement and overall I'm extremely pleased with the progress that she has made. Fortunately she does not seem to be having as much pain. Integumentary (Hair, Skin) Wound #2 status is Open. Original cause of wound was Pressure Injury. The wound is located on the Right Calcaneus. The wound measures 1cm length x 2.1cm width x 0.1cm depth; 1.649cm^2 area and 0.165cm^3 volume. There is Fat Layer (Subcutaneous Tissue) Exposed exposed. There is no tunneling or undermining noted. There is a medium amount of serous drainage noted. The wound margin is flat and intact. There is small (1-33%) pink granulation within the wound bed. There is a large (67-100%) amount of necrotic tissue within the wound bed including Necrosis of Bone. The periwound skin appearance did not exhibit: Callus, Crepitus, Excoriation, Induration, Rash, Scarring, Dry/Scaly, Maceration, Atrophie  Blanche, Cyanosis, Ecchymosis, Hemosiderin Staining, Mottled, Pallor, Rubor, Erythema. Periwound temperature was noted as No Abnormality. The periwound has tenderness on palpation. Assessment Active Problems ICD-10 L89.613 - Pressure ulcer of right heel, stage 3 I70.234 - Atherosclerosis of native arteries of right leg with ulceration of heel and midfoot R54 - Age-related physical debility Procedures Wound #2 Pre-procedure diagnosis of Wound #2 is a Pressure Ulcer located on the Right Calcaneus . There was a Excisional Skin/Subcutaneous Tissue Debridement with a total area of 4.2 sq cm performed by STONE III, Jackston [place] E., PA-C. With the following instrument(s): Curette. to remove Viable and Non-Viable tissue/material Material removed includes Subcutaneous Tissue, Biofilm, and Other: dressing material after achieving pain control using Lidocaine 4% Topical Solution. No specimens were taken. A time out was conducted at 12:56, prior to the start of the procedure. A Minimum amount of bleeding was controlled with Pressure. The procedure was tolerated well with a pain level of 0 throughout and a pain level of 0 following the procedure. Post Debridement Measurements: 1cm length x 2.1cm width x 0.2cm depth; 0.33cm^3 volume. Post debridement Stage noted as Category/Stage III. Character of Wound/Ulcer Post Debridement is improved. Post procedure Diagnosis Wound #2: Same as Pre-Procedure [name], Judyann E. ([id number]) Plan Wound Cleansing: Wound #2 Right Calcaneus: Clean wound with Normal Saline. May Shower, gently pat wound dry prior to applying new dressing. Anesthetic (add to Medication List): Wound #2 Right Calcaneus: Topical Lidocaine 4% cream applied to wound bed prior to debridement (In Clinic Only). Primary Wound Dressing: Wound #2 Right Calcaneus: Silver Collagen Secondary Dressing: Wound #2 Right Calcaneus: Conform/Kerlix Foam - heel cup Drawtex Dressing Change Frequency: Wound #2  Right Calcaneus: Change dressing every other day. Follow-up Appointments: Wound #2 Right Calcaneus: Return Appointment in 1 week. Additional Orders /  Instructions: Wound #2 Right Calcaneus: Vitamin A; Vitamin C, Zinc - Please add an over the counter multivitamin that has 100% of each of these Increase protein intake. I am going to recommend at this point time that we continue with the Current wound care measures for the next week. She is in agreement with plan. We will continue to follow her here the [facility] clinic again she does have her vascular appointment coming up in a little over a week. I do think I was so recommend doing this despite the fact the wound is healing you want to ensure that everything is okay with her blood flow even going forward. Patient is in agreement that plan. Otherwise we will see were things stand in one weeks time. Please see above for specific wound care orders. We will see patient for re-evaluation in 1 week(s) here in the clinic. If anything worsens or changes patient will contact our office for additional recommendations. Electronic Signature(s) Signed: 07/07/2017 6:18:22 PM By: Worthy Keeler PA-C Entered By: Worthy Keeler on 07/07/2017 13:07:48 Limehouse, YanceyMarland Kitchen ([id number]) -------------------------------------------------------------------------------- ROS/PFSH Details Patient Name: MIQUELA, COSTABILE E. Date of Service: 07/07/2017 12:30 PM Medical Record Number: [id number] Patient Account Number: [id number] Date of Birth/Sex: [birth year] (84 y.o. F) Treating RN: Roger Shelter Primary Care Provider: Eliezer Lofts Other Clinician: Referring Provider: Eliezer Lofts Treating Provider/Extender: Melburn Hake, Daneli Butkiewicz Weeks in Treatment: 0 Information Obtained From Patient Wound History Do you currently have one or more open woundso Yes How many open wounds do you currently haveo 1 Approximately how long have you had your woundso 3 months Has your wound(s)  ever healed and then re-openedo No Have you had any lab work done in the past montho No Have you tested positive for an antibiotic resistant organism (MRSA, VRE)o No Have you tested positive for osteomyelitis (bone infection)o No Have you had any tests for circulation on your legso No Eyes Complaints and Symptoms: Positive for: Glasses / Contacts - glasses Negative for: Dry Eyes; Vision Changes Medical History: Positive for: Cataracts - surgery Hematologic/Lymphatic Medical History: Positive for: Anemia Cardiovascular Medical History: Positive for: Congestive Heart Failure; Myocardial Infarction Endocrine Medical History: Positive for: Type II Diabetes Time with diabetes: 15 yrs Treated with: Insulin Blood sugar tested every day: Yes Tested : Musculoskeletal Medical History: Positive for: Osteoarthritis Neurologic Medical History: Positive for: Neuropathy Mcbreen, Johnnette E. ([id number]) HBO Extended History Items Eyes: Cataracts Immunizations Pneumococcal Vaccine: Received Pneumococcal Vaccination: Yes Implantable Devices Family and Social History Cancer: Yes - Mother,Child; Diabetes: Yes - Child; Heart Disease: No; Hereditary Spherocytosis: No; Hypertension: Yes - Child; Kidney Disease: No; Lung Disease: No; Seizures: No; Stroke: No; Thyroid Problems: Yes - Child; Tuberculosis: No; Former smoker - quit 50 yrs ago; Marital Status - Widowed; Alcohol Use: Never; Drug Use: No History; Caffeine Use: Daily; Financial Concerns: No; Food, Clothing or Shelter Needs: No; Support System Lacking: No; Transportation Concerns: No; Advanced Directives: No; Patient does not want information on Advanced Directives; Do not resuscitate: No; Living Will: Yes (Not Provided); Medical Power of Attorney: No Electronic Signature(s) Signed: 07/07/2017 4:26:13 PM By: Roger Shelter Signed: 07/07/2017 6:18:22 PM By: Worthy Keeler PA-C Entered By: Roger Shelter on 07/07/2017 12:45:00 Kohan,  Myalynn E. ([id number]) -------------------------------------------------------------------------------- SuperBill Details Patient Name: [name] Reining E. Date of Service: 07/07/2017 Medical Record Number: [id number] Patient Account Number: [id number] Date of Birth/Sex: 22-Oct-1929 (84 y.o. 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 R54 Age-related physical debility Facility Procedures CPT4 Code: 51102111 Description: 73567 - WOUND CARE VISIT-LEV 3 EST PT Modifier: Quantity: 1 CPT4 Code: 01410301 Description: 31438 - DEB SUBQ TISSUE 20 SQ CM/< ICD-10 Diagnosis Description L89.613 Pressure ulcer of right heel, stage 3 Modifier: Quantity: 1 Physician Procedures CPT4 Code: 8875797 Description: 28206 - WC PHYS SUBQ TISS 20 SQ CM ICD-10 Diagnosis Description L89.613 Pressure ulcer of right heel, stage 3 Modifier: Quantity: 1 Electronic Signature(s) Signed: 07/07/2017 6:18:22 PM By: Worthy Keeler PA-C Entered By: Worthy Keeler on 07/07/2017 13:07:59

## 2017-07-14 ENCOUNTER — Encounter: Payer: PPO | Attending: Physician Assistant | Admitting: Physician Assistant

## 2017-07-14 DIAGNOSIS — I509 Heart failure, unspecified: Secondary | ICD-10-CM | POA: Diagnosis not present

## 2017-07-14 DIAGNOSIS — E1140 Type 2 diabetes mellitus with diabetic neuropathy, unspecified: Secondary | ICD-10-CM | POA: Diagnosis not present

## 2017-07-14 DIAGNOSIS — L97519 Non-pressure chronic ulcer of other part of right foot with unspecified severity: Secondary | ICD-10-CM | POA: Diagnosis not present

## 2017-07-14 DIAGNOSIS — E1151 Type 2 diabetes mellitus with diabetic peripheral angiopathy without gangrene: Secondary | ICD-10-CM | POA: Insufficient documentation

## 2017-07-14 DIAGNOSIS — Z87891 Personal history of nicotine dependence: Secondary | ICD-10-CM | POA: Diagnosis not present

## 2017-07-14 DIAGNOSIS — M1990 Unspecified osteoarthritis, unspecified site: Secondary | ICD-10-CM | POA: Insufficient documentation

## 2017-07-14 DIAGNOSIS — I252 Old myocardial infarction: Secondary | ICD-10-CM | POA: Insufficient documentation

## 2017-07-14 DIAGNOSIS — L89613 Pressure ulcer of right heel, stage 3: Secondary | ICD-10-CM | POA: Insufficient documentation

## 2017-07-14 DIAGNOSIS — E11621 Type 2 diabetes mellitus with foot ulcer: Secondary | ICD-10-CM | POA: Insufficient documentation

## 2017-07-14 DIAGNOSIS — Z96649 Presence of unspecified artificial hip joint: Secondary | ICD-10-CM | POA: Insufficient documentation

## 2017-07-14 NOTE — Progress Notes (Signed)
[name], [name] ([id number]) Visit Report for 07/07/2017 Allergy List Details Patient Name: [name], [name] Date of Service: 07/07/2017 12:30 PM Medical Record Number: [id number] Patient Account Number: [id number] Date of Birth/Sex: 05/03/1929 (84 y.o. F) Treating RN: [name] Primary Care [name]: [name] Other Clinician: Referring [name]: [name] Treating [place] Yarbro/Extender: [name], [name] in Treatment: 0 Allergies Active Allergies codeine metformin penicillin pioglitazone fenofibrate Allergy Notes Electronic Signature(s) Signed: 07/07/2017 4:26:13 PM By: [name] Entered By: [name] on 07/07/2017 12:48:12 [place], [name] ([id number]) -------------------------------------------------------------------------------- Arrival Information Details Patient Name: [name], [name] Date of Service: 07/07/2017 12:30 PM Medical Record Number: [id number] Patient Account Number: [id number] Date of Birth/Sex: 02/23/1930 (84 y.o. F) Treating RN: [name] Primary Care [name]: [name] Other Clinician: Referring Kaylen Motl: [name] Treating [name]/Extender: [name], [name] in Treatment: 0 Visit Information Patient Arrived: Walker Arrival Time: 12:37 Transfer Assistance: None History Since Last Visit All ordered tests and consults were completed: No Added or deleted any medications: No Any new allergies or adverse reactions: No Had a fall or experienced change in activities of daily living that may affect risk of falls: No Signs or symptoms of abuse/neglect since last visito No Implantable device outside of the clinic excluding cellular tissue based products placed in the center since last visit: No Electronic Signature(s) Signed: 07/07/2017 4:26:13 PM By: [name] Entered By: [name] on 07/07/2017 12:37:59 [name], [name]  ([id number]) -------------------------------------------------------------------------------- Clinic Level of Care Assessment Details Patient Name: [name] Date of Service: 07/07/2017 12:30 PM Medical Record Number: [id number] Patient Account Number: [id number] Date of Birth/Sex: 09-04-1929 (84 y.o. F) Treating RN: Montey Hora Primary Care Orlandria Kissner: [name] Other Clinician: Referring Anaiyah Anglemyer: [name] Treating Ranee Peasley/Extender: [name], [name] in Treatment: 0 Clinic Level of Care Assessment Items TOOL 1 Quantity Score []  - Use when EandM and Procedure is performed on INITIAL visit 0 ASSESSMENTS - Nursing Assessment / Reassessment X - General Physical Exam (combine w/ comprehensive assessment (listed just below) when 1 20 performed on new pt. evals) X- 1 25 Comprehensive Assessment (HX, ROS, Risk Assessments, Wounds Hx, etc.) ASSESSMENTS - Wound and Skin Assessment / Reassessment []  - Dermatologic / Skin Assessment (not related to wound area) 0 ASSESSMENTS - Ostomy and/or Continence Assessment and Care []  - Incontinence Assessment and Management 0 []  - 0 Ostomy Care Assessment and Management (repouching, etc.) PROCESS - Coordination of Care X - Simple Patient / Family Education for ongoing care 1 15 []  - 0 Complex (extensive) Patient / Family Education for ongoing care X- 1 10 Staff obtains Programmer, systems, Records, Test Results / Process Orders []  - 0 Staff telephones HHA, Nursing Homes / Clarify orders / etc []  - 0 Routine Transfer to another Facility (non-emergent condition) []  - 0 Routine Hospital Admission (non-emergent condition) X- 1 15 New Admissions / Biomedical engineer / Ordering NPWT, Apligraf, etc. []  - 0 Emergency Hospital Admission (emergent condition) PROCESS - Special Needs []  - Pediatric / Minor Patient Management 0 []  - 0 Isolation Patient Management []  - 0 Hearing / Language / Visual special needs []  - 0 Assessment of  Community assistance (transportation, D/C planning, etc.) []  - 0 Additional assistance / Altered mentation []  - 0 Support Surface(s) Assessment (bed, cushion, seat, etc.) [place], [name] ([id number]) INTERVENTIONS - Miscellaneous []  - External ear exam 0 []  - 0 Patient Transfer (multiple staff / Civil Service fast streamer / Similar devices) []  - 0 Simple Staple / Suture removal (25 or less) []  -  0 Complex Staple / Suture removal (26 or more) []  - 0 Hypo/Hyperglycemic Management (do not check if billed separately) []  - 0 Ankle / Brachial Index (ABI) - do not check if billed separately Has the patient been seen at the hospital within the last three years: Yes Total Score: 85 Level Of Care: New/Established - Level 3 Electronic Signature(s) Signed: 07/07/2017 4:53:17 PM By: Montey Hora Entered By: Montey Hora on 07/07/2017 13:04:48 [name], [name] ([id number]) -------------------------------------------------------------------------------- Encounter Discharge Information Details Patient Name: [name], [name] Date of Service: 07/07/2017 12:30 PM Medical Record Number: [id number] Patient Account Number: [id number] Date of Birth/Sex: 1929-08-23 (84 y.o. F) Treating RN: Montey Hora Primary Care Iveth Heidemann: [name] Other Clinician: Referring Dartanian Knaggs: [name] Treating Aadvik Roker/Extender: [name], [name] in Treatment: 0 Encounter Discharge Information Items Discharge Pain Level: 0 Discharge Condition: Stable Ambulatory Status: Walker Discharge Destination: Home Transportation: Private Auto Accompanied By: daughter Schedule Follow-up Appointment: Yes Medication Reconciliation completed and No provided to Patient/Care Aaryn Sermon: Provided on Clinical Summary of Care: 07/07/2017 Form Type Recipient Paper Patient EG Electronic Signature(s) Signed: 07/13/2017 4:32:30 PM By: Alric Quan Entered By: Alric Quan on 07/07/2017 13:13:03 [place], [name]  ([id number]) -------------------------------------------------------------------------------- Lower Extremity Assessment Details Patient Name: [name], [name] Date of Service: 07/07/2017 12:30 PM Medical Record Number: [id number] Patient Account Number: [id number] Date of Birth/Sex: 1930-02-17 (84 y.o. F) Treating RN: [name] Primary Care Sindee Stucker: [name] Other Clinician: Referring Justun Anaya: [name] Treating Deonta Bomberger/Extender: [name], [name] in Treatment: 0 Edema Assessment Assessed: [Left: No] [Right: No] Edema: [Left: N] [Right: o] Vascular Assessment Claudication: Claudication Assessment [Right:None] Pulses: Dorsalis Pedis Palpable: [Right:Yes] Posterior Tibial Extremity colors, hair growth, and conditions: Extremity Color: [Right:Normal] Hair Growth on Extremity: [Right:Yes] Temperature of Extremity: [Right:Warm] Capillary Refill: [Right:< 3 seconds] Toe Nail Assessment Left: Right: Thick: Yes Discolored: Yes Deformed: No Improper Length and Hygiene: No Electronic Signature(s) Signed: 07/07/2017 4:26:13 PM By: [name] Entered By: [name] on 07/07/2017 12:48:01 Peddie, Mela [name]. ([id number]) -------------------------------------------------------------------------------- Multi Wound Chart Details Patient Name: [name], [name] Date of Service: 07/07/2017 12:30 PM Medical Record Number: [id number] Patient Account Number: [id number] Date of Birth/Sex: 1930-03-29 (84 y.o. F) Treating RN: Montey Hora Primary Care Jevan Gaunt: [name] Other Clinician: Referring Loraine Bhullar: [name] Treating Timothey Dahlstrom/Extender: [name], [name] in Treatment: 0 Vital Signs Height(in): 63 Pulse(bpm): 93 Weight(lbs): 139 Blood Pressure(mmHg): 139/81 Body Mass Index(BMI): 25 Temperature(F): 97.8 Respiratory Rate 18 (breaths/min): Photos: [2:No Photos] [N/A:N/A] Wound Location: [2:Right Calcaneus] [N/A:N/A] Wounding Event:  [2:Pressure Injury] [N/A:N/A] Primary Etiology: [2:Pressure Ulcer] [N/A:N/A] Comorbid History: [2:Cataracts, Anemia, Congestive Heart Failure, Myocardial Infarction, Type II Diabetes, Osteoarthritis, Neuropathy] [N/A:N/A] Date Acquired: [2:04/03/2017] [N/A:N/A] [name] of Treatment: [2:0] [N/A:N/A] Wound Status: [2:Open] [N/A:N/A] Measurements L x W x D [2:1x2.1x0.1] [N/A:N/A] (cm) Area (cm) : [2:1.649] [N/A:N/A] Volume (cm) : [2:0.165] [N/A:N/A] Classification: [2:Category/Stage III] [N/A:N/A] Exudate Amount: [2:Medium] [N/A:N/A] Exudate Type: [2:Serous] [N/A:N/A] Exudate Color: [2:amber] [N/A:N/A] Wound Margin: [2:Flat and Intact] [N/A:N/A] Granulation Amount: [2:Small (1-33%)] [N/A:N/A] Granulation Quality: [2:Pink] [N/A:N/A] Necrotic Amount: [2:Large (67-100%)] [N/A:N/A] Exposed Structures: [2:Fat Layer (Subcutaneous Tissue) Exposed: Yes Fascia: No Tendon: No Muscle: No Joint: No Bone: No] [N/A:N/A] Epithelialization: [2:None] [N/A:N/A] Debridement: [2:Debridement - Excisional] [N/A:N/A] Pre-procedure [2:12:56] [N/A:N/A] Verification/Time Out Taken: Pain Control: [2:Lidocaine 4% Topical Solution] [N/A:N/A] Tissue Debrided: [2:Other, Subcutaneous] [N/A:N/A] Level: Skin/Subcutaneous Tissue N/A N/A Debridement Area (sq cm): 4.2 N/A N/A Instrument: Curette N/A N/A Bleeding: Minimum N/A N/A Hemostasis Achieved: Pressure N/A N/A Procedural Pain: 0 N/A N/A Post Procedural Pain:  0 N/A N/A Debridement Treatment Procedure was tolerated well N/A N/A Response: Post Debridement 1x2.1x0.2 N/A N/A Measurements L x W x D (cm) Post Debridement Volume: 0.33 N/A N/A (cm) Post Debridement Stage: Category/Stage III N/A N/A Periwound Skin Texture: Excoriation: No N/A N/A Induration: No Callus: No Crepitus: No Rash: No Scarring: No Periwound Skin Moisture: Maceration: No N/A N/A Dry/Scaly: No Periwound Skin Color: Atrophie Blanche: No N/A N/A Cyanosis: No Ecchymosis:  No Erythema: No Hemosiderin Staining: No Mottled: No Pallor: No Rubor: No Temperature: No Abnormality N/A N/A Tenderness on Palpation: Yes N/A N/A Wound Preparation: Ulcer Cleansing: N/A N/A Rinsed/Irrigated with Saline Topical Anesthetic Applied: Other: lidocaine 4% Procedures Performed: Debridement N/A N/A Treatment Notes Electronic Signature(s) Signed: 07/07/2017 4:53:17 PM By: Montey Hora Entered By: Montey Hora on 07/07/2017 12:59:52 [place], Ashland [name]. ([id number]) -------------------------------------------------------------------------------- Multi-Disciplinary Care Plan Details Patient Name: [name], [name] Date of Service: 07/07/2017 12:30 PM Medical Record Number: [id number] Patient Account Number: [id number] Date of Birth/Sex: 11-29-1929 (84 y.o. F) Treating RN: Montey Hora Primary Care Lasheika Ortloff: [name] Other Clinician: Referring Michelina Mexicano: [name] Treating Delos Klich/Extender: [name], [name] in Treatment: 0 Active Inactive ` Orientation to the Wound Care Program Nursing Diagnoses: Knowledge deficit related to the wound healing center program Goals: Patient/caregiver will verbalize understanding of the [place] Program Date Initiated: 07/07/2017 Target Resolution Date: 09/16/2017 Goal Status: Active Interventions: Provide education on orientation to the wound center Notes: ` Wound/Skin Impairment Nursing Diagnoses: Impaired tissue integrity Goals: Ulcer/skin breakdown will heal within 14 [name] Date Initiated: 07/07/2017 Target Resolution Date: 09/16/2017 Goal Status: Active Interventions: Assess patient/caregiver ability to obtain necessary supplies Assess patient/caregiver ability to perform ulcer/skin care regimen upon admission and as needed Assess ulceration(s) every visit Notes: Electronic Signature(s) Signed: 07/07/2017 4:53:17 PM By: Montey Hora Entered By: Montey Hora on 07/07/2017 12:59:40 [place]aka, Carlis [name].  ([id number]) -------------------------------------------------------------------------------- Pain Assessment Details Patient Name: [name], [name] Date of Service: 07/07/2017 12:30 PM Medical Record Number: [id number] Patient Account Number: [id number] Date of Birth/Sex: Feb 08, 1930 (84 y.o. F) Treating RN: [name] Primary Care Daionna Crossland: [name] Other Clinician: Referring Deeanne Deininger: [name] Treating Yidel Teuscher/Extender: [name], [name] in Treatment: 0 Active Problems Location of Pain Severity and Description of Pain Patient Has Paino No Site Locations Pain Management and Medication Current Pain Management: Electronic Signature(s) Signed: 07/07/2017 4:26:13 PM By: [name] Entered By: [name] on 07/07/2017 12:38:06 [name], [name] EMarland Kitchen ([id number]) -------------------------------------------------------------------------------- Patient/Caregiver Education Details Patient Name: [name] Date of Service: 07/07/2017 12:30 PM Medical Record Number: [id number] Patient Account Number: [id number] Date of Birth/Gender: [name] 20, 1931 (84 y.o. F) Treating RN: Ahmed Prima Primary Care Physician: [name] Other Clinician: Referring Physician: Eliezer [name] Treating Physician/Extender: Sharalyn Ink in Treatment: 0 Education Assessment Education Provided To: Patient Education Topics Provided Wound/Skin Impairment: Handouts: Caring for Your Ulcer, Other: change dressing as ordered Methods: Demonstration, Explain/Verbal Responses: State content correctly Electronic Signature(s) Signed: 07/13/2017 4:32:30 PM By: Alric Quan Entered By: Alric Quan on 07/07/2017 13:14:22 [place], Aiyla [name]. ([id number]) -------------------------------------------------------------------------------- Wound Assessment Details Patient Name: [name], [name] Date of Service: 07/07/2017 12:30 PM Medical Record Number: [id number] Patient Account Number:  [id number] Date of Birth/Sex: Sep 16, 1929 (84 y.o. F) Treating RN: [name] Primary Care Emmaline Wahba: [name] Other Clinician: Referring Ashantia Amaral: [name] Treating Cienna Dumais/Extender: [name], [name] in Treatment: 0 Wound Status Wound Number: 2 Primary Pressure Ulcer Etiology: Wound Location: Right Calcaneus Wound Open Wounding Event: Pressure Injury Status: Date Acquired: 04/03/2017 Comorbid Cataracts, Anemia,  Congestive Heart Failure, [name] Of Treatment: 0 History: Myocardial Infarction, Type II Diabetes, Clustered Wound: No Osteoarthritis, Neuropathy Photos Wound Measurements Length: (cm) 1 % Reduction in Width: (cm) 2.1 % Reduction in Depth: (cm) 0.1 Epithelializati Area: (cm) 1.649 Tunneling: Volume: (cm) 0.165 Undermining: Area: 0% Volume: 0% on: None No No Wound Description Classification: Category/Stage III Foul Odor Afte Wound Margin: Flat and Intact Slough/Fibrino Exudate Amount: Medium Exudate Type: Serous Exudate Color: amber r Cleansing: No Yes Wound Bed Granulation Amount: Small (1-33%) Exposed Structure Granulation Quality: Pink Fascia Exposed: No Necrotic Amount: Large (67-100%) Fat Layer (Subcutaneous Tissue) Exposed: Yes Necrotic Quality: Bone Tendon Exposed: No Muscle Exposed: No Joint Exposed: No Bone Exposed: No Periwound Skin Texture Texture Color [name], [name] ([id number]) No Abnormalities Noted: No No Abnormalities Noted: No Callus: No Atrophie Blanche: No Crepitus: No Cyanosis: No Excoriation: No Ecchymosis: No Induration: No Erythema: No Rash: No Hemosiderin Staining: No Scarring: No Mottled: No Pallor: No Moisture Rubor: No No Abnormalities Noted: No Dry / Scaly: No Temperature / Pain Maceration: No Temperature: No Abnormality Tenderness on Palpation: Yes Wound Preparation Ulcer Cleansing: Rinsed/Irrigated with Saline Topical Anesthetic Applied: Other: lidocaine 4%, Electronic  Signature(s) Signed: 07/10/2017 5:00:26 PM By: [name] Signed: 07/10/2017 5:08:18 PM By: Montey Hora Previous Signature: 07/07/2017 4:26:13 PM Version By: [name] Entered By: Montey Hora on 07/10/2017 15:38:42 [place], Tiffinie [name]. ([id number]) -------------------------------------------------------------------------------- Vitals Details Patient Name: PASCUAL, Zarriah [name]. Date of Service: 07/07/2017 12:30 PM Medical Record Number: [id number] Patient Account Number: [id number] Date of Birth/Sex: Dec 31, 1929 (84 y.o. F) Treating RN: [name] Primary Care Jaquitta Dupriest: [name] Other Clinician: Referring Spirit Wernli: [name] Treating Xenia Nile/Extender: [name], [name] in Treatment: 0 Vital Signs Time Taken: 12:38 Temperature (F): 97.8 Height (in): 63 Pulse (bpm): 93 Source: Stated Respiratory Rate (breaths/min): 18 Weight (lbs): 139 Blood Pressure (mmHg): 139/81 Source: Stated Reference Range: 80 - 120 mg / dl Body Mass Index (BMI): 24.6 Electronic Signature(s) Signed: 07/07/2017 4:26:13 PM By: [name] Entered By: [name] on 07/07/2017 12:38:53

## 2017-07-17 ENCOUNTER — Ambulatory Visit (HOSPITAL_COMMUNITY)
Admission: RE | Admit: 2017-07-17 | Discharge: 2017-07-17 | Disposition: A | Discharge status: Home / Self Care | Payer: PPO | Source: Ambulatory Visit | Attending: Vascular Surgery | Admitting: Vascular Surgery

## 2017-07-17 DIAGNOSIS — I739 Peripheral vascular disease, unspecified: Secondary | ICD-10-CM | POA: Diagnosis not present

## 2017-07-18 NOTE — Progress Notes (Signed)
SHARITA, BIENAIME ([id number]) Visit Report for 07/14/2017 Arrival Information Details Patient Name: [name], [name] Date of Service: 07/14/2017 11:00 AM Medical Record Number: [id number] Patient Account Number: [id number] Date of Birth/Sex: [birth year] (84 y.o. F) Treating RN: Roger Shelter Primary Care Whitney Bingaman: Eliezer Lofts Other Clinician: Referring Marisa Hage: Eliezer Lofts Treating Azrielle Springsteen/Extender: Melburn Hake, HOYT Weeks in Treatment: 1 Visit Information History Since Last Visit All ordered tests and consults were completed: No Patient Arrived: [name] Added or deleted any medications: No Arrival Time: 11:04 Any new allergies or adverse reactions: No Accompanied By: daughter Had a fall or experienced change in No Transfer Assistance: None activities of daily living that may affect Patient Identification Verified: Yes risk of falls: Secondary Verification Process Completed: Yes Signs or symptoms of abuse/neglect since last visito No Hospitalized since last visit: No Implantable device outside of the clinic excluding No cellular tissue based products placed in the center since last visit: Pain Present Now: No Electronic Signature(s) Signed: 07/14/2017 4:14:16 PM By: Roger Shelter Entered By: Roger Shelter on 07/14/2017 11:04:44 Tyrone, Lawsyn E. ([id number]) -------------------------------------------------------------------------------- Encounter Discharge Information Details Patient Name: [name] Reining E. Date of Service: 07/14/2017 11:00 AM Medical Record Number: [id number] Patient Account Number: [id number] Date of Birth/Sex: June 05, 1929 (84 y.o. F) Treating RN: Montey Hora Primary Care Victormanuel Mclure: Eliezer Lofts Other Clinician: Referring Daquon Greenleaf: Eliezer Lofts Treating Gillian Meeuwsen/Extender: Melburn Hake, HOYT Weeks in Treatment: 1 Encounter Discharge Information Items Discharge Pain Level: 0 Discharge Condition: Stable Ambulatory Status: Walker Discharge Destination:  Home Transportation: Private Auto Accompanied By: dtr Schedule Follow-up Appointment: Yes Medication Reconciliation completed and No provided to Patient/Care Lise Pincus: Provided on Clinical Summary of Care: 07/14/2017 Form Type Recipient Paper Patient EG Electronic Signature(s) Signed: 07/17/2017 10:47:07 AM By: Ruthine Dose Entered By: Ruthine Dose on 07/14/2017 11:48:56 [place], [place]. ([id number]) -------------------------------------------------------------------------------- Lower Extremity Assessment Details Patient Name: [name], [name] E. Date of Service: 07/14/2017 11:00 AM Medical Record Number: [id number] Patient Account Number: [id number] Date of Birth/Sex: [birth year] (84 y.o. F) Treating RN: Roger Shelter Primary Care Areli Frary: Eliezer Lofts Other Clinician: Referring Donya Hitch: Eliezer Lofts Treating Sarabeth Benton/Extender: Melburn Hake, HOYT Weeks in Treatment: 1 Edema Assessment Assessed: [Left: No] [Right: No] Edema: [Left: N] [Right: o] Vascular Assessment Claudication: Claudication Assessment [Right:None] Pulses: Dorsalis Pedis Palpable: [Right:Yes] Posterior Tibial Extremity colors, hair growth, and conditions: Extremity Color: [Right:Normal] Hair Growth on Extremity: [Right:Yes] Temperature of Extremity: [Right:Warm] Capillary Refill: [Right:< 3 seconds] Toe Nail Assessment Left: Right: Thick: No Discolored: No Deformed: No Improper Length and Hygiene: No Electronic Signature(s) Signed: 07/14/2017 4:14:16 PM By: Roger Shelter Entered By: Roger Shelter on 07/14/2017 11:11:59 Merendino, Breana E. ([id number]) -------------------------------------------------------------------------------- Multi Wound Chart Details Patient Name: [name], [name] E. Date of Service: 07/14/2017 11:00 AM Medical Record Number: [id number] Patient Account Number: [id number] Date of Birth/Sex: [birth year] (84 y.o. F) Treating RN: Montey Hora Primary Care Keshan Reha: Eliezer Lofts Other  Clinician: Referring Eudell Julian: Eliezer Lofts Treating Nichlas Pitera/Extender: Melburn Hake, HOYT Weeks in Treatment: 1 Vital Signs Height(in): 63 Pulse(bpm): 18 Weight(lbs): 139 Blood Pressure(mmHg): 127/64 Body Mass Index(BMI): 25 Temperature(F): 97.5 Respiratory Rate 18 (breaths/min): Photos: [2:No Photos] [N/A:N/A] Wound Location: [2:Right Calcaneus] [N/A:N/A] Wounding Event: [2:Pressure Injury] [N/A:N/A] Primary Etiology: [2:Pressure Ulcer] [N/A:N/A] Comorbid History: [2:Cataracts, Anemia, Congestive Heart Failure, Myocardial Infarction, Type II Diabetes, Osteoarthritis, Neuropathy] [N/A:N/A] Date Acquired: [2:04/03/2017] [N/A:N/A] Weeks of Treatment: [2:1] [N/A:N/A] Wound Status: [2:Open] [N/A:N/A] Measurements L x W x D [2:0.5x0.6x0.1] [N/A:N/A] (cm) Area (cm) : [2:0.236] [N/A:N/A] Volume (cm) : [2:0.024] [N/A:N/A] % Reduction in Area: [2:85.70%] [N/A:N/A] % Reduction in  Volume: [2:85.50%] [N/A:N/A] Classification: [2:Category/Stage III] [N/A:N/A] Exudate Amount: [2:Medium] [N/A:N/A] Exudate Type: [2:Serous] [N/A:N/A] Exudate Color: [2:amber] [N/A:N/A] Wound Margin: [2:Flat and Intact] [N/A:N/A] Granulation Amount: [2:Small (1-33%)] [N/A:N/A] Granulation Quality: [2:Red, Pink] [N/A:N/A] Necrotic Amount: [2:Large (67-100%)] [N/A:N/A] Necrotic Tissue: [2:Eschar] [N/A:N/A] Exposed Structures: [2:Fat Layer (Subcutaneous Tissue) Exposed: Yes Fascia: No Tendon: No Muscle: No Joint: No Bone: No] [N/A:N/A] Epithelialization: [2:Small (1-33%)] [N/A:N/A] Periwound Skin Texture: [2:Excoriation: No Induration: No] [N/A:N/A] Callus: No Crepitus: No Rash: No Scarring: No Periwound Skin Moisture: Maceration: No N/A N/A Dry/Scaly: No Periwound Skin Color: Atrophie Blanche: No N/A N/A Cyanosis: No Ecchymosis: No Erythema: No Hemosiderin Staining: No Mottled: No Pallor: No Rubor: No Temperature: No Abnormality N/A N/A Tenderness on Palpation: Yes N/A N/A Wound  Preparation: Ulcer Cleansing: N/A N/A Rinsed/Irrigated with Saline Topical Anesthetic Applied: Other: lidocaine 4% Treatment Notes Electronic Signature(s) Signed: 07/14/2017 3:34:53 PM By: Montey Hora Entered By: Montey Hora on 07/14/2017 11:39:26 Aragones, Jensine EMarland Kitchen ([id number]) -------------------------------------------------------------------------------- Multi-Disciplinary Care Plan Details Patient Name: VIKKI, GAINS E. Date of Service: 07/14/2017 11:00 AM Medical Record Number: [id number] Patient Account Number: [id number] Date of Birth/Sex: [birth year] (84 y.o. F) Treating RN: Montey Hora Primary Care Kolsen Choe: Eliezer Lofts Other Clinician: Referring Lillyn Wieczorek: Eliezer Lofts Treating Stepahnie Campo/Extender: Melburn Hake, HOYT Weeks in Treatment: 1 Active Inactive ` Orientation to the Wound Care Program Nursing Diagnoses: Knowledge deficit related to the wound healing center program Goals: Patient/caregiver will verbalize understanding of the [facility] Program Date Initiated: 07/07/2017 Target Resolution Date: 09/16/2017 Goal Status: Active Interventions: Provide education on orientation to the wound center Notes: ` Wound/Skin Impairment Nursing Diagnoses: Impaired tissue integrity Goals: Ulcer/skin breakdown will heal within 14 weeks Date Initiated: 07/07/2017 Target Resolution Date: 09/16/2017 Goal Status: Active Interventions: Assess patient/caregiver ability to obtain necessary supplies Assess patient/caregiver ability to perform ulcer/skin care regimen upon admission and as needed Assess ulceration(s) every visit Notes: Electronic Signature(s) Signed: 07/14/2017 3:34:53 PM By: Montey Hora Entered By: Montey Hora on 07/14/2017 11:39:16 [place], [place]. ([id number]) -------------------------------------------------------------------------------- Pain Assessment Details Patient Name: [name], Nastassia E. Date of Service: 07/14/2017 11:00 AM Medical Record  Number: [id number] Patient Account Number: [id number] Date of Birth/Sex: [birth year] (84 y.o. F) Treating RN: Roger Shelter Primary Care Crislyn Willbanks: Eliezer Lofts Other Clinician: Referring Neely Cecena: Eliezer Lofts Treating Alys Dulak/Extender: Melburn Hake, HOYT Weeks in Treatment: 1 Active Problems Location of Pain Severity and Description of Pain Patient Has Paino No Site Locations Pain Management and Medication Current Pain Management: Electronic Signature(s) Signed: 07/14/2017 4:14:16 PM By: Roger Shelter Entered By: Roger Shelter on 07/14/2017 11:04:59 Goerner, Trace E. ([id number]) -------------------------------------------------------------------------------- Patient/Caregiver Education Details Patient Name: [name] Reining E. Date of Service: 07/14/2017 11:00 AM Medical Record Number: [id number] Patient Account Number: [id number] Date of Birth/Gender: Jul 05, 1929 (84 y.o. F) Treating RN: Montey Hora Primary Care Physician: Eliezer Lofts Other Clinician: Referring Physician: Eliezer Lofts Treating Physician/Extender: Sharalyn Ink in Treatment: 1 Education Assessment Education Provided To: Patient and Caregiver Education Topics Provided Wound/Skin Impairment: Handouts: Other: wound care as ordered Methods: Demonstration, Explain/Verbal Responses: State content correctly Electronic Signature(s) Signed: 07/14/2017 3:34:53 PM By: Montey Hora Entered By: Montey Hora on 07/14/2017 11:47:27 Farina, Maniah E. ([id number]) -------------------------------------------------------------------------------- Wound Assessment Details Patient Name: [name], Leilanie E. Date of Service: 07/14/2017 11:00 AM Medical Record Number: [id number] Patient Account Number: [id number] Date of Birth/Sex: 02-06-1930 (84 y.o. F) Treating RN: Roger Shelter Primary Care Seleny Allbright: Eliezer Lofts Other Clinician: Referring Krystn Dermody: Eliezer Lofts Treating Tan Clopper/Extender: Melburn Hake, HOYT Weeks in  Treatment: 1 Wound Status Wound Number: 2  Primary Pressure Ulcer Etiology: Wound Location: Right Calcaneus Wound Open Wounding Event: Pressure Injury Status: Date Acquired: 04/03/2017 Comorbid Cataracts, Anemia, Congestive Heart Failure, Weeks Of Treatment: 1 History: Myocardial Infarction, Type II Diabetes, Clustered Wound: No Osteoarthritis, Neuropathy Photos Photo Uploaded By: Roger Shelter on 07/14/2017 16:03:23 Wound Measurements Length: (cm) 0.5 Width: (cm) 0.6 Depth: (cm) 0.1 Area: (cm) 0.236 Volume: (cm) 0.024 % Reduction in Area: 85.7% % Reduction in Volume: 85.5% Epithelialization: Small (1-33%) Tunneling: No Undermining: No Wound Description Classification: Category/Stage III Wound Margin: Flat and Intact Exudate Amount: Medium Exudate Type: Serous Exudate Color: amber Foul Odor After Cleansing: No Slough/Fibrino Yes Wound Bed Granulation Amount: Small (1-33%) Exposed Structure Granulation Quality: Red, Pink Fascia Exposed: No Necrotic Amount: Large (67-100%) Fat Layer (Subcutaneous Tissue) Exposed: Yes Necrotic Quality: Eschar Tendon Exposed: No Muscle Exposed: No Joint Exposed: No Bone Exposed: No Periwound Skin Texture Rigel, Otelia E. ([id number]) Texture Color No Abnormalities Noted: No No Abnormalities Noted: No Callus: No Atrophie Blanche: No Crepitus: No Cyanosis: No Excoriation: No Ecchymosis: No Induration: No Erythema: No Rash: No Hemosiderin Staining: No Scarring: No Mottled: No Pallor: No Moisture Rubor: No No Abnormalities Noted: No Dry / Scaly: No Temperature / Pain Maceration: No Temperature: No Abnormality Tenderness on Palpation: Yes Wound Preparation Ulcer Cleansing: Rinsed/Irrigated with Saline Topical Anesthetic Applied: Other: lidocaine 4%, Treatment Notes Wound #2 (Right Calcaneus) 1. Cleansed with: Clean wound with Normal Saline 2. Anesthetic Topical Lidocaine 4% cream to wound bed prior to  debridement 3. Peri-wound Care: Skin Prep 4. Dressing Applied: Prisma Ag 5. Secondary Dressing Applied Bordered Foam Dressing Electronic Signature(s) Signed: 07/14/2017 4:14:16 PM By: Roger Shelter Entered By: Roger Shelter on 07/14/2017 11:11:16 Dodds, Gennesis E. ([id number]) -------------------------------------------------------------------------------- Vitals Details Patient Name: [name] Reining E. Date of Service: 07/14/2017 11:00 AM Medical Record Number: [id number] Patient Account Number: [id number] Date of Birth/Sex: 02-Feb-1930 (84 y.o. F) Treating RN: Roger Shelter Primary Care Jebidiah Baggerly: Eliezer Lofts Other Clinician: Referring Masahiro Iglesia: Eliezer Lofts Treating Terrill Alperin/Extender: Melburn Hake, HOYT Weeks in Treatment: 1 Vital Signs Time Taken: 11:05 Temperature (F): 97.5 Height (in): 63 Pulse (bpm): 78 Weight (lbs): 139 Respiratory Rate (breaths/min): 18 Body Mass Index (BMI): 24.6 Blood Pressure (mmHg): 127/64 Reference Range: 80 - 120 mg / dl Electronic Signature(s) Signed: 07/14/2017 4:14:16 PM By: Roger Shelter Entered By: Roger Shelter on 07/14/2017 11:05:20

## 2017-07-18 NOTE — Progress Notes (Signed)
[name] ([id number]) Visit Report for 07/14/2017 Chief Complaint Document Details Patient Name: [name], [name] Date of Service: 07/14/2017 11:00 AM Medical Record Number: [id number] Patient Account Number: [id number] Date of Birth/Sex: [birth year] (84 y.o. F) Treating RN: Montey Hora Primary Care Provider: Eliezer Lofts Other Clinician: Referring Provider: Eliezer Lofts Treating Provider/Extender: Melburn Hake, HOYT Weeks in Treatment: 1 Information Obtained from: Patient Chief Complaint she presents for initial evaluation of a right posterior heel pressure ulcer Electronic Signature(s) Signed: 07/14/2017 5:57:55 PM By: Worthy Keeler PA-C Entered By: Worthy Keeler on 07/14/2017 17:53:17 [place], [place] ([id number]) -------------------------------------------------------------------------------- Debridement Details Patient Name: [name] Date of Service: 07/14/2017 11:00 AM Medical Record Number: [id number] Patient Account Number: [id number] Date of Birth/Sex: 07/09/1929 (84 y.o. F) Treating RN: Montey Hora Primary Care Provider: Eliezer Lofts Other Clinician: Referring Provider: Eliezer Lofts Treating Provider/Extender: Melburn Hake, HOYT Weeks in Treatment: 1 Debridement Performed for Wound #2 Right Calcaneus Assessment: Performed By: Physician STONE III, HOYT [name]., PA-C Debridement Type: Debridement Pre-procedure Verification/Time Yes - 11:39 Out Taken: Start Time: 11:40 Pain Control: Lidocaine 4% Topical Solution Total Area Debrided (L x W): 0.5 (cm) x 0.6 (cm) = 0.3 (cm) Tissue and other material Viable, Non-Viable debrided: Level: Non-Viable Tissue Debridement Description: Selective/Open Wound Instrument: Curette Bleeding: Minimum Hemostasis Achieved: Pressure End Time: 11:43 Procedural Pain: 0 Post Procedural Pain: 0 Response to Treatment: Procedure was tolerated well Post Debridement Measurements of Total Wound Length: (cm) 0.2 Stage:  Category/Stage III Width: (cm) 0.2 Depth: (cm) 0.2 Volume: (cm) 0.006 Character of Wound/Ulcer Post Requires Further Debridement Debridement: Post Procedure Diagnosis Same as Pre-procedure Electronic Signature(s) Signed: 07/14/2017 3:34:53 PM By: Montey Hora Signed: 07/14/2017 5:57:55 PM By: Worthy Keeler PA-C Entered By: Montey Hora on 07/14/2017 11:44:22 [place], [place]. ([id number]) -------------------------------------------------------------------------------- HPI Details Patient Name: [name], [name] Date of Service: 07/14/2017 11:00 AM Medical Record Number: [id number] Patient Account Number: [id number] Date of Birth/Sex: July 04, 1929 (84 y.o. F) Treating RN: Montey Hora Primary Care Provider: Eliezer Lofts Other Clinician: Referring Provider: Eliezer Lofts Treating Provider/Extender: Melburn Hake, HOYT Weeks in Treatment: 1 History of Present Illness Location: right posterior heel Quality: intermittent burning and sharp pains Context: recurrent pressure Modifying Factors: prolonged and unrelieved pressure delay wound healing HPI Description: 05/09/17-she is here for initial evaluation of a right posterior heel ulcer, accompanied by her daughter. She was hospitalized in November for right hip fracture and repair. 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 She states that during her hospitalization she developed multiple pressure ulcers, the only remaining is the posterior right heel. Home health has been following. She has applied a variety of ointments including Vaseline and Desitin to the right heel. She states that approximately 4 years ago she followed with Dr. Oneida Alar at [facility] secondary to claudication. She states there is "nothing they can do" and denies ever having intervention. She is a former smoker, quitting >30 years ago She is a diabetic, her last A1c was 6.6 in December. ABIs in  the office were unable to be obtained. There is soft lifted eschar to the ulcer which has been selectively debrided. I recommended a second opinion from vascular medicine, the would prefer to stay local, therefore a referral to [facility] has been sent. We will apply Santyl daily; she has been advised to contact the clinic as soon as possible if she is unable to afford the medication. She has been  educated on the need for aggressive offloading to the right heel. 05/19/17 on evaluation today patient appears to be doing rather well in regard to her right posterior heel ulcer. She has been tolerating the dressing changes without complication. With that being said I am very pleased with how things have been going at this point in time from what I see at this point. Patient likewise is happy with the progress more specifically her daughter is to has been taking care of this ulcer in particular. She has been using Santyl currently with good result. 05/26/17 on evaluation today patient appears to be doing about the same in regard to her ulcer at this point. She has been tolerating the dressing changes. We have been using Santyl to help clean up the ulcer bed. She does have her appointment with [facility] on 05/30/17. Hopefully they will be able to determine her blood flow and whether any intervention may be necessary or not. With that being said she fortunately is doing well not having significant pain at this time. 06/05/17 on evaluation today patient appears to be doing much better in regard to her right heel wound. She has been tolerating the dressing changes without complication. She is continuing to utilize the [place] at this point and this does seem to be doing very well for her. Overall I'm pleased with the progress that she has made in the interim from last week to this week she has much more granulation apparent in the base of the wound and there does not appear to be  any evidence of infection. Her daughter has been doing very well caring for and cleaning the wound. Admission to [facility]: 06/14/17 on evaluation today patient actually appears to be doing excellent in regard to her right heel ulcer. It appears to be more and shows excellent signs of improvement since last week's evaluation. She has been tolerating the dressing changes well other than the fact that she thought the Santyl might be causing some burning although we switch to hydrogen when she still experience the burning. I think this may be more of an underlying arterial issue or potentially just a neuropathy type issue. 06/21/17 on evaluation today patient appears to be doing very well in regard to her wound. She is making good progress as far as evaluation is concerned at this point. She has been tolerating the dressing changes with the silver collagen without any complication. Fortunately there appears to be no evidence of infection. She does have her appointment with vascular on April 8 and then the follow up appointment to discuss the results on April 11. Otherwise there does not appear to be any evidence of anything worsening at this time. 06/28/17 on evaluation today patient appears to be doing better in regard to her right heel ulcer. We are seeing more [place], [place] ([id number]) granulation noted she did have some dry skin which was actually easily removed just with saline and gauze today in the periphery of the wound. After removal this appears to even be better as far as the overall appearance of the wound is concerned. We are still waiting on vascular referral. Readmission back to [facility]: 07/07/17 on evaluation today patient is seen for reevaluation here in the [place] clinic. She is actually been seen in the past three weeks by myself in [place]. However she was really not happy with the wait time she tells me. They were often having to wait two and half hours  which really  did not make any sense considering how quickly they get in and out of the clinic here they tell me. Therefore they decided to transfer back to the [facility] wound center. Nonetheless she has been doing excellent at this point in time. We have been using Prisma with Drawtex over top and then a heel cup followed by Kerlex to secure in place. She states at this point in time this has been doing very well in fact the wound does appear to be much better today. 07/14/17 on evaluation today patient presents concerning her right heel ulcer. She actually appears to be doing very well today was just a small opening once the eschar and dressing that were dried on were removed. I'm very pleased with how things seem to be progressing. Electronic Signature(s) Signed: 07/14/2017 5:57:55 PM By: Worthy Keeler PA-C Entered By: Worthy Keeler on 07/14/2017 17:55:22 [place], [place]. ([id number]) -------------------------------------------------------------------------------- Physical Exam Details Patient Name: [name], [name] Date of Service: 07/14/2017 11:00 AM Medical Record Number: [id number] Patient Account Number: [id number] Date of Birth/Sex: [birth year] (84 y.o. F) Treating RN: Montey Hora Primary Care Provider: Eliezer Lofts Other Clinician: Referring Provider: Eliezer Lofts Treating Provider/Extender: Melburn Hake, HOYT Weeks in Treatment: 1 Constitutional Well-nourished and well-hydrated in no acute distress. Respiratory normal breathing without difficulty. Psychiatric this patient is able to make decisions and demonstrates good insight into disease process. Alert and Oriented x 3. pleasant and cooperative. Notes Patient's wound did have significant eschar and dressing covering the wound surface once this was debrided away the actual opening was rather small slightly less than 3 mm. Overall she also had less pain than previously noted. Electronic Signature(s) Signed: 07/14/2017 5:57:55  PM By: Worthy Keeler PA-C Entered By: Worthy Keeler on 07/14/2017 17:56:30 [name], [name] EMarland Kitchen ([id number]) -------------------------------------------------------------------------------- Physician Orders Details Patient Name: [name], [name] Date of Service: 07/14/2017 11:00 AM Medical Record Number: [id number] Patient Account Number: [id number] Date of Birth/Sex: [birth year] (84 y.o. F) Treating RN: Montey Hora Primary Care Provider: Eliezer Lofts Other Clinician: Referring Provider: Eliezer Lofts Treating Provider/Extender: Melburn Hake, HOYT Weeks in Treatment: 1 Verbal / Phone Orders: No Diagnosis Coding Wound Cleansing Wound #2 Right Calcaneus o Clean wound with Normal Saline. o May Shower, gently pat wound dry prior to applying new dressing. Anesthetic (add to Medication List) Wound #2 Right Calcaneus o Topical Lidocaine 4% cream applied to wound bed prior to debridement (In Clinic Only). Primary Wound Dressing Wound #2 Right Calcaneus o Silver Collagen Secondary Dressing Wound #2 Right Calcaneus o Boardered Foam Dressing Dressing Change Frequency Wound #2 Right Calcaneus o Change dressing every other day. Follow-up Appointments Wound #2 Right Calcaneus o Return Appointment in 1 week. Additional Orders / Instructions Wound #2 Right Calcaneus o Vitamin A; Vitamin C, Zinc - Please add an over the counter multivitamin that has 100% of each of these o Increase protein intake. Electronic Signature(s) Signed: 07/14/2017 3:34:53 PM By: Montey Hora Signed: 07/14/2017 5:57:55 PM By: Worthy Keeler PA-C Entered By: Montey Hora on 07/14/2017 11:45:35 [name], [name] ([id number]) -------------------------------------------------------------------------------- Problem List Details Patient Name: [name], [name] Date of Service: 07/14/2017 11:00 AM Medical Record Number: [id number] Patient Account Number: [id number] Date of Birth/Sex: 1929-04-24 (84 y.o.  F) Treating RN: Montey Hora Primary Care Provider: Eliezer Lofts Other Clinician: Referring Provider: Eliezer Lofts Treating Provider/Extender: Melburn Hake, HOYT Weeks in Treatment: 1 Active Problems ICD-10 Impacting Encounter Code Description Active Date Wound Healing Diagnosis L89.613 Pressure ulcer of right heel, stage 3 07/07/2017  Yes I70.234 Atherosclerosis of native arteries of right leg with ulceration of 07/07/2017 Yes heel and midfoot R54 Age-related physical debility 07/07/2017 Yes Inactive Problems Resolved Problems Electronic Signature(s) Signed: 07/14/2017 5:57:55 PM By: Worthy Keeler PA-C Entered By: Worthy Keeler on 07/14/2017 17:53:07 [name], [name] ([id number]) -------------------------------------------------------------------------------- Progress Note Details Patient Name: [name], [name] Date of Service: 07/14/2017 11:00 AM Medical Record Number: [id number] Patient Account Number: [id number] Date of Birth/Sex: October 01, 1929 (84 y.o. F) Treating RN: Montey Hora Primary Care Provider: Eliezer Lofts Other Clinician: Referring Provider: Eliezer Lofts Treating Provider/Extender: Melburn Hake, HOYT Weeks in Treatment: 1 Subjective Chief Complaint Information obtained from Patient she presents for initial evaluation of a right posterior heel pressure ulcer History of Present Illness (HPI) The following HPI elements were documented for the patient's wound: Location: right posterior heel Quality: intermittent burning and sharp pains Context: recurrent pressure Modifying Factors: prolonged and unrelieved pressure delay wound healing 05/09/17-she is here for initial evaluation of a right posterior heel ulcer, accompanied by her daughter. She was hospitalized in November for right hip fracture and repair. 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 She states that during her hospitalization she developed multiple  pressure ulcers, the only remaining is the posterior right heel. Home health has been following. She has applied a variety of ointments including Vaseline and Desitin to the right heel. She states that approximately 4 years ago she followed with Dr. Oneida Alar at [facility] secondary to claudication. She states there is "nothing they can do" and denies ever having intervention. She is a former smoker, quitting >30 years ago She is a diabetic, her last A1c was 6.6 in December. ABIs in the office were unable to be obtained. There is soft lifted eschar to the ulcer which has been selectively debrided. I recommended a second opinion from vascular medicine, the would prefer to stay local, therefore a referral to [facility] has been sent. We will apply Santyl daily; she has been advised to contact the clinic as soon as possible if she is unable to afford the medication. She has been educated on the need for aggressive offloading to the right heel. 05/19/17 on evaluation today patient appears to be doing rather well in regard to her right posterior heel ulcer. She has been tolerating the dressing changes without complication. With that being said I am very pleased with how things have been going at this point in time from what I see at this point. Patient likewise is happy with the progress more specifically her daughter is to has been taking care of this ulcer in particular. She has been using Santyl currently with good result. 05/26/17 on evaluation today patient appears to be doing about the same in regard to her ulcer at this point. She has been tolerating the dressing changes. We have been using Santyl to help clean up the ulcer bed. She does have her appointment with [facility] on 05/30/17. Hopefully they will be able to determine her blood flow and whether any intervention may be necessary or not. With that being said she fortunately is doing well not having  significant pain at this time. 06/05/17 on evaluation today patient appears to be doing much better in regard to her right heel wound. She has been tolerating the dressing changes without complication. She is continuing to utilize the [place] at this point and this does seem to be doing very well  for her. Overall I'm pleased with the progress that she has made in the interim from last week to this week she has much more granulation apparent in the base of the wound and there does not appear to be any evidence of infection. Her daughter has been doing very well caring for and cleaning the wound. Admission to [facility]: 06/14/17 on evaluation today patient actually appears to be doing excellent in regard to her right heel ulcer. It appears to be more and shows excellent signs of improvement since last week's evaluation. She has been tolerating the dressing changes well other than the fact that she thought the Santyl might be causing some burning although we switch to hydrogen when she still experience the burning. I think this may be more of an underlying arterial issue or potentially just a neuropathy type issue. [name], [name] ([id number]) 06/21/17 on evaluation today patient appears to be doing very well in regard to her wound. She is making good progress as far as evaluation is concerned at this point. She has been tolerating the dressing changes with the silver collagen without any complication. Fortunately there appears to be no evidence of infection. She does have her appointment with vascular on April 8 and then the follow up appointment to discuss the results on April 11. Otherwise there does not appear to be any evidence of anything worsening at this time. 06/28/17 on evaluation today patient appears to be doing better in regard to her right heel ulcer. We are seeing more granulation noted she did have some dry skin which was actually easily removed just with saline and gauze today in  the periphery of the wound. After removal this appears to even be better as far as the overall appearance of the wound is concerned. We are still waiting on vascular referral. Readmission back to [facility]: 07/07/17 on evaluation today patient is seen for reevaluation here in the [place] clinic. She is actually been seen in the past three weeks by myself in [place]. However she was really not happy with the wait time she tells me. They were often having to wait two and half hours which really did not make any sense considering how quickly they get in and out of the clinic here they tell me. Therefore they decided to transfer back to the [facility] wound center. Nonetheless she has been doing excellent at this point in time. We have been using Prisma with Drawtex over top and then a heel cup followed by Kerlex to secure in place. She states at this point in time this has been doing very well in fact the wound does appear to be much better today. 07/14/17 on evaluation today patient presents concerning her right heel ulcer. She actually appears to be doing very well today was just a small opening once the eschar and dressing that were dried on were removed. I'm very pleased with how things seem to be progressing. Patient History Information obtained from Patient. Family History Cancer - Mother,Child, Diabetes - Child, Hypertension - Child, Thyroid Problems - Child, No family history of Heart Disease, Hereditary Spherocytosis, Kidney Disease, Lung Disease, Seizures, Stroke, Tuberculosis. Social History Former smoker - quit 50 yrs ago, Marital Status - Widowed, Alcohol Use - Never, Drug Use - No History, Caffeine Use - Daily. Review of Systems (ROS) Constitutional Symptoms (General Health) Denies complaints or symptoms of Fever, Chills. Respiratory The patient has no complaints or symptoms. Cardiovascular The patient has no complaints or symptoms. Psychiatric The patient  has no  complaints or symptoms. Objective Constitutional Well-nourished and well-hydrated in no acute distress. [place], [place] ([id number]) Vitals Time Taken: 11:05 AM, Height: 63 in, Weight: 139 lbs, BMI: 24.6, Temperature: 97.5 F, Pulse: 78 bpm, Respiratory Rate: 18 breaths/min, Blood Pressure: 127/64 mmHg. Respiratory normal breathing without difficulty. Psychiatric this patient is able to make decisions and demonstrates good insight into disease process. Alert and Oriented x 3. pleasant and cooperative. General Notes: Patient's wound did have significant eschar and dressing covering the wound surface once this was debrided away the actual opening was rather small slightly less than 3 mm. Overall she also had less pain than previously noted. Integumentary (Hair, Skin) Wound #2 status is Open. Original cause of wound was Pressure Injury. The wound is located on the Right Calcaneus. The wound measures 0.5cm length x 0.6cm width x 0.1cm depth; 0.236cm^2 area and 0.024cm^3 volume. There is Fat Layer (Subcutaneous Tissue) Exposed exposed. There is no tunneling or undermining noted. There is a medium amount of serous drainage noted. The wound margin is flat and intact. There is small (1-33%) red, pink granulation within the wound bed. There is a large (67-100%) amount of necrotic tissue within the wound bed including Eschar. The periwound skin appearance did not exhibit: Callus, Crepitus, Excoriation, Induration, Rash, Scarring, Dry/Scaly, Maceration, Atrophie Blanche, Cyanosis, Ecchymosis, Hemosiderin Staining, Mottled, Pallor, Rubor, Erythema. Periwound temperature was noted as No Abnormality. The periwound has tenderness on palpation. Assessment Active Problems ICD-10 L89.613 - Pressure ulcer of right heel, stage 3 I70.234 - Atherosclerosis of native arteries of right leg with ulceration of heel and midfoot R54 - Age-related physical debility Procedures Wound #2 Pre-procedure diagnosis of  Wound #2 is a Pressure Ulcer located on the Right Calcaneus . There was a Selective/Open Wound Non-Viable Tissue Debridement with a total area of 0.3 sq cm performed by STONE III, HOYT [name]., PA-C. With the following instrument(s): Curette. to remove Viable and Non-Viable tissue/material after achieving pain control using Lidocaine 4% Topical Solution. No specimens were taken. A time out was conducted at 11:39, prior to the start of the procedure. A Minimum amount of bleeding was controlled with Pressure. The procedure was tolerated well with a pain level of 0 throughout and a pain level of 0 following the procedure. Post Debridement Measurements: 0.2cm length x 0.2cm width x 0.2cm depth; 0.006cm^3 volume. Post debridement Stage noted as Category/Stage III. Character of Wound/Ulcer Post Debridement requires further debridement. Post procedure Diagnosis Wound #2: Same as Pre-Procedure Quinter, Kadynce [name]. ([id number]) Plan Wound Cleansing: Wound #2 Right Calcaneus: Clean wound with Normal Saline. May Shower, gently pat wound dry prior to applying new dressing. Anesthetic (add to Medication List): Wound #2 Right Calcaneus: Topical Lidocaine 4% cream applied to wound bed prior to debridement (In Clinic Only). Primary Wound Dressing: Wound #2 Right Calcaneus: Silver Collagen Secondary Dressing: Wound #2 Right Calcaneus: Boardered Foam Dressing Dressing Change Frequency: Wound #2 Right Calcaneus: Change dressing every other day. Follow-up Appointments: Wound #2 Right Calcaneus: Return Appointment in 1 week. Additional Orders / Instructions: Wound #2 Right Calcaneus: Vitamin A; Vitamin C, Zinc - Please add an over the counter multivitamin that has 100% of each of these Increase protein intake. I'm going to suggest at this point that we continue with the Current wound care measures although we're gonna discontinue the Drawtex as I feel like this is no longer necessary. In fact I believe it may  just be keeping this to dry cause and that to build up eschar. Nonetheless  I'm hopeful that the patient will be able to be released from wound care in the next several weeks this has really progressed so well I'm super excited for her. She is likewise excited. Please see above for specific wound care orders. We will see patient for re-evaluation in 1 week(s) here in the clinic. If anything worsens or changes patient will contact our office for additional recommendations. Electronic Signature(s) Signed: 07/14/2017 5:57:55 PM By: Worthy Keeler PA-C Entered By: Worthy Keeler on 07/14/2017 17:56:55 [name], [name] ([id number]) -------------------------------------------------------------------------------- ROS/PFSH Details Patient Name: AUTUM, BENFER [name]. Date of Service: 07/14/2017 11:00 AM Medical Record Number: [id number] Patient Account Number: [id number] Date of Birth/Sex: 1930/02/02 (84 y.o. F) Treating RN: Montey Hora Primary Care Provider: Eliezer Lofts Other Clinician: Referring Provider: Eliezer Lofts Treating Provider/Extender: Melburn Hake, HOYT Weeks in Treatment: 1 Information Obtained From Patient Wound History Do you currently have one or more open woundso Yes How many open wounds do you currently haveo 1 Approximately how long have you had your woundso 3 months Has your wound(s) ever healed and then re-openedo No Have you had any lab work done in the past montho No Have you tested positive for an antibiotic resistant organism (MRSA, VRE)o No Have you tested positive for osteomyelitis (bone infection)o No Have you had any tests for circulation on your legso No Constitutional Symptoms (General Health) Complaints and Symptoms: Negative for: Fever; Chills Eyes Medical History: Positive for: Cataracts - surgery Hematologic/Lymphatic Medical History: Positive for: Anemia Respiratory Complaints and Symptoms: No Complaints or Symptoms Cardiovascular Complaints and  Symptoms: No Complaints or Symptoms Medical History: Positive for: Congestive Heart Failure; Myocardial Infarction Endocrine Medical History: Positive for: Type II Diabetes Time with diabetes: 15 yrs Treated with: Insulin Blood sugar tested every day: Yes Tested : JAMIA, HOBAN ([id number]) Musculoskeletal Medical History: Positive for: Osteoarthritis Neurologic Medical History: Positive for: Neuropathy Psychiatric Complaints and Symptoms: No Complaints or Symptoms HBO Extended History Items Eyes: Cataracts Immunizations Pneumococcal Vaccine: Received Pneumococcal Vaccination: Yes Implantable Devices Family and Social History Cancer: Yes - Mother,Child; Diabetes: Yes - Child; Heart Disease: No; Hereditary Spherocytosis: No; Hypertension: Yes - Child; Kidney Disease: No; Lung Disease: No; Seizures: No; Stroke: No; Thyroid Problems: Yes - Child; Tuberculosis: No; Former smoker - quit 50 yrs ago; Marital Status - Widowed; Alcohol Use: Never; Drug Use: No History; Caffeine Use: Daily; Financial Concerns: No; Food, Clothing or Shelter Needs: No; Support System Lacking: No; Transportation Concerns: No; Advanced Directives: No; Patient does not want information on Advanced Directives; Do not resuscitate: No; Living Will: Yes (Not Provided); Medical Power of Attorney: No Physician Affirmation I have reviewed and agree with the above information. Electronic Signature(s) Signed: 07/14/2017 5:57:55 PM By: Worthy Keeler PA-C Signed: 07/17/2017 4:12:39 PM By: Montey Hora Entered By: Worthy Keeler on 07/14/2017 17:55:54 Meriweather, Adamaris [name]. ([id number]) -------------------------------------------------------------------------------- SuperBill Details Patient Name: TSUTSUI, Gabryelle [name]. Date of Service: 07/14/2017 Medical Record Number: [id number] Patient Account Number: [id number] Date of Birth/Sex: 03/05/1930 (84 y.o. F) Treating RN: Montey Hora Primary Care Provider: Eliezer Lofts  Other Clinician: Referring Provider: Eliezer Lofts Treating Provider/Extender: Melburn Hake, HOYT Weeks in Treatment: 1 Diagnosis Coding ICD-10 Codes Code Description L89.613 Pressure ulcer of right heel, stage 3 I70.234 Atherosclerosis of native arteries of right leg with ulceration of heel and midfoot R54 Age-related physical debility Facility Procedures CPT4 Code: 63016010 Description: 93235 - DEBRIDE WOUND 1ST 20 SQ CM OR < ICD-10 Diagnosis Description L89.613 Pressure ulcer of right heel, stage 3 Modifier:  Quantity: 1 Physician Procedures CPT4 Code: 2774128 Description: 78676 - WC PHYS DEBR WO ANESTH 20 SQ CM ICD-10 Diagnosis Description L89.613 Pressure ulcer of right heel, stage 3 Modifier: Quantity: 1 Electronic Signature(s) Signed: 07/14/2017 5:57:55 PM By: Worthy Keeler PA-C Entered By: Worthy Keeler on 07/14/2017 17:57:09

## 2017-07-20 ENCOUNTER — Encounter: Payer: PPO | Admitting: Vascular Surgery

## 2017-07-21 ENCOUNTER — Encounter: Payer: PPO | Admitting: Physician Assistant

## 2017-07-21 DIAGNOSIS — E11621 Type 2 diabetes mellitus with foot ulcer: Secondary | ICD-10-CM | POA: Diagnosis not present

## 2017-07-21 DIAGNOSIS — L89613 Pressure ulcer of right heel, stage 3: Secondary | ICD-10-CM | POA: Diagnosis not present

## 2017-07-25 ENCOUNTER — Ambulatory Visit: Payer: PPO | Admitting: Vascular Surgery

## 2017-07-25 ENCOUNTER — Other Ambulatory Visit: Payer: Self-pay

## 2017-07-25 ENCOUNTER — Encounter: Payer: Self-pay | Admitting: Vascular Surgery

## 2017-07-25 VITALS — BP 109/71 | HR 71 | Temp 97.6°F | Resp 20 | Ht 63.0 in | Wt 142.0 lb

## 2017-07-25 DIAGNOSIS — I739 Peripheral vascular disease, unspecified: Secondary | ICD-10-CM

## 2017-07-25 NOTE — Progress Notes (Signed)
AUBURN, HESTER ([id number]) Visit Report for 07/21/2017 Arrival Information Details Patient Name: [name], [name] Date of Service: 07/21/2017 11:15 AM Medical Record Number: [id number] Patient Account Number: [id number] Date of Birth/Sex: Jul 01, 1929 (84 y.o. F) Treating RN: Roger Shelter Primary Care Caresse Sedivy: Eliezer Lofts Other Clinician: Referring Lj Miyamoto: Eliezer Lofts Treating Rylon Poitra/Extender: Melburn Hake, HOYT Weeks in Treatment: 2 Visit Information History Since Last Visit All ordered tests and consults were completed: No Patient Arrived: Gilford Rile Added or deleted any medications: No Arrival Time: 11:06 Any new allergies or adverse reactions: No Accompanied By: grand daughter Had a fall or experienced change in No Transfer Assistance: None activities of daily living that may affect Patient Identification Verified: Yes risk of falls: Secondary Verification Process Completed: Yes Signs or symptoms of abuse/neglect since last visito No Hospitalized since last visit: No Implantable device outside of the clinic excluding No cellular tissue based products placed in the center since last visit: Pain Present Now: No Electronic Signature(s) Signed: 07/21/2017 4:04:12 PM By: Roger Shelter Entered By: Roger Shelter on 07/21/2017 11:06:58 [place], Rianna E. ([id number]) -------------------------------------------------------------------------------- Clinic Level of Care Assessment Details Patient Name: [name], [name] E. Date of Service: 07/21/2017 11:15 AM Medical Record Number: [id number] Patient Account Number: [id number] Date of Birth/Sex: 1930-03-18 (84 y.o. F) Treating RN: Montey Hora Primary Care Aundre Hietala: Eliezer Lofts Other Clinician: Referring Nautika Cressey: Eliezer Lofts Treating Terrion Poblano/Extender: Melburn Hake, HOYT Weeks in Treatment: 2 Clinic Level of Care Assessment Items TOOL 4 Quantity Score []  - Use when only an EandM is performed on FOLLOW-UP visit 0 ASSESSMENTS -  Nursing Assessment / Reassessment X - Reassessment of Co-morbidities (includes updates in patient status) 1 10 X- 1 5 Reassessment of Adherence to Treatment Plan ASSESSMENTS - Wound and Skin Assessment / Reassessment X - Simple Wound Assessment / Reassessment - one wound 1 5 []  - 0 Complex Wound Assessment / Reassessment - multiple wounds []  - 0 Dermatologic / Skin Assessment (not related to wound area) ASSESSMENTS - Focused Assessment []  - Circumferential Edema Measurements - multi extremities 0 []  - 0 Nutritional Assessment / Counseling / Intervention X- 1 5 Lower Extremity Assessment (monofilament, tuning fork, pulses) []  - 0 Peripheral Arterial Disease Assessment (using hand held doppler) ASSESSMENTS - Ostomy and/or Continence Assessment and Care []  - Incontinence Assessment and Management 0 []  - 0 Ostomy Care Assessment and Management (repouching, etc.) PROCESS - Coordination of Care X - Simple Patient / Family Education for ongoing care 1 15 []  - 0 Complex (extensive) Patient / Family Education for ongoing care []  - 0 Staff obtains Programmer, systems, Records, Test Results / Process Orders []  - 0 Staff telephones HHA, Nursing Homes / Clarify orders / etc []  - 0 Routine Transfer to another Facility (non-emergent condition) []  - 0 Routine Hospital Admission (non-emergent condition) []  - 0 New Admissions / Biomedical engineer / Ordering NPWT, Apligraf, etc. []  - 0 Emergency Hospital Admission (emergent condition) X- 1 10 Simple Discharge Coordination [place], Seaira E. ([id number]) []  - 0 Complex (extensive) Discharge Coordination PROCESS - Special Needs []  - Pediatric / Minor Patient Management 0 []  - 0 Isolation Patient Management []  - 0 Hearing / Language / Visual special needs []  - 0 Assessment of Community assistance (transportation, D/C planning, etc.) []  - 0 Additional assistance / Altered mentation []  - 0 Support Surface(s) Assessment (bed, cushion, seat,  etc.) INTERVENTIONS - Wound Cleansing / Measurement X - Simple Wound Cleansing - one wound 1 5 []  - 0 Complex Wound Cleansing - multiple wounds X- 1 5  Wound Imaging (photographs - any number of wounds) []  - 0 Wound Tracing (instead of photographs) X- 1 5 Simple Wound Measurement - one wound []  - 0 Complex Wound Measurement - multiple wounds INTERVENTIONS - Wound Dressings []  - Small Wound Dressing one or multiple wounds 0 []  - 0 Medium Wound Dressing one or multiple wounds []  - 0 Large Wound Dressing one or multiple wounds []  - 0 Application of Medications - topical []  - 0 Application of Medications - injection INTERVENTIONS - Miscellaneous []  - External ear exam 0 []  - 0 Specimen Collection (cultures, biopsies, blood, body fluids, etc.) []  - 0 Specimen(s) / Culture(s) sent or taken to Lab for analysis []  - 0 Patient Transfer (multiple staff / Civil Service fast streamer / Similar devices) []  - 0 Simple Staple / Suture removal (25 or less) []  - 0 Complex Staple / Suture removal (26 or more) []  - 0 Hypo / Hyperglycemic Management (close monitor of Blood Glucose) []  - 0 Ankle / Brachial Index (ABI) - do not check if billed separately X- 1 5 Vital Signs [name], [name] E. ([id number]) Has the patient been seen at the hospital within the last three years: Yes Total Score: 70 Level Of Care: New/Established - Level 2 Electronic Signature(s) Signed: 07/21/2017 4:45:51 PM By: Montey Hora Entered By: Montey Hora on 07/21/2017 12:05:45 [place], Elveta E. ([id number]) -------------------------------------------------------------------------------- Encounter Discharge Information Details Patient Name: [name], [name] E. Date of Service: 07/21/2017 11:15 AM Medical Record Number: [id number] Patient Account Number: [id number] Date of Birth/Sex: Apr 23, 1929 (84 y.o. F) Treating RN: Montey Hora Primary Care Yacob Wilkerson: Eliezer Lofts Other Clinician: Referring Brysin Towery: Eliezer Lofts Treating  Avree Szczygiel/Extender: Melburn Hake, HOYT Weeks in Treatment: 2 Encounter Discharge Information Items Discharge Pain Level: 0 Discharge Condition: Stable Ambulatory Status: Walker Discharge Destination: Home Transportation: Private Auto Accompanied By: dtr Schedule Follow-up Appointment: Yes Medication Reconciliation completed and No provided to Patient/Care Taraoluwa Thakur: Provided on Clinical Summary of Care: 07/21/2017 Form Type Recipient Paper Patient EG Electronic Signature(s) Signed: 07/21/2017 4:45:51 PM By: Montey Hora Entered By: Montey Hora on 07/21/2017 12:10:14 [place], Gearlene E. ([id number]) -------------------------------------------------------------------------------- Lower Extremity Assessment Details Patient Name: [name], [name] E. Date of Service: 07/21/2017 11:15 AM Medical Record Number: [id number] Patient Account Number: [id number] Date of Birth/Sex: 1930-03-05 (84 y.o. F) Treating RN: Roger Shelter Primary Care Haleema Vanderheyden: Eliezer Lofts Other Clinician: Referring Mykenzi Vanzile: Eliezer Lofts Treating Valin Massie/Extender: Melburn Hake, HOYT Weeks in Treatment: 2 Edema Assessment Assessed: [Left: No] [Right: No] Edema: [Left: N] [Right: o] Vascular Assessment Claudication: Claudication Assessment [Right:None] Pulses: Posterior Tibial Extremity colors, hair growth, and conditions: Extremity Color: [Right:Normal] Hair Growth on Extremity: [Right:No] Temperature of Extremity: [Right:Warm] Capillary Refill: [Right:< 3 seconds] Toe Nail Assessment Left: Right: Thick: Yes Discolored: Yes Deformed: No Improper Length and Hygiene: Yes Electronic Signature(s) Signed: 07/21/2017 4:04:12 PM By: Roger Shelter Entered By: Roger Shelter on 07/21/2017 11:13:38 [place], Alany EMarland [name] ([id number]) -------------------------------------------------------------------------------- [place] Details Patient Name: [name], [name] E. Date of Service: 07/21/2017 11:15  AM Medical Record Number: [id number] Patient Account Number: [id number] Date of Birth/Sex: Jun 05, 1929 (84 y.o. F) Treating RN: Montey Hora Primary Care Maicie Vanderloop: Eliezer Lofts Other Clinician: Referring Almarie Kurdziel: Eliezer Lofts Treating Marquest Gunkel/Extender: Melburn Hake, HOYT Weeks in Treatment: 2 Active Inactive Electronic Signature(s) Signed: 07/21/2017 4:45:51 PM By: Montey Hora Entered By: Montey Hora on 07/21/2017 12:03:38 [place], Deniah E. ([id number]) -------------------------------------------------------------------------------- Pain Assessment Details Patient Name: [name], [name] E. Date of Service: 07/21/2017 11:15 AM Medical Record Number: [id number] Patient Account Number: [id number] Date of Birth/Sex: [birth year] (84 y.o. F)  Treating RN: Roger Shelter Primary Care Londell Noll: Eliezer Lofts Other Clinician: Referring Brooklee Michelin: Eliezer Lofts Treating Asucena Galer/Extender: Melburn Hake, HOYT Weeks in Treatment: 2 Active Problems Location of Pain Severity and Description of Pain Patient Has Paino No Site Locations Pain Management and Medication Current Pain Management: Electronic Signature(s) Signed: 07/21/2017 4:04:12 PM By: Roger Shelter Entered By: Roger Shelter on 07/21/2017 11:07:04 [place], Kyndal E. ([id number]) -------------------------------------------------------------------------------- Patient/Caregiver Education Details Patient Name: [name] Reining E. Date of Service: 07/21/2017 11:15 AM Medical Record Number: [id number] Patient Account Number: [id number] Date of Birth/Gender: 23-Nov-1929 (84 y.o. F) Treating RN: Montey Hora Primary Care Physician: Eliezer Lofts Other Clinician: Referring Physician: Eliezer Lofts Treating Physician/Extender: Sharalyn Ink in Treatment: 2 Education Assessment Education Provided To: Patient and Caregiver Education Topics Provided Basic Hygiene: Handouts: Other: care of newly healed ulcer site Methods:  Explain/Verbal Responses: State content correctly Electronic Signature(s) Signed: 07/21/2017 4:45:51 PM By: Montey Hora Entered By: Montey Hora on 07/21/2017 12:10:41 [place], Kennya E. ([id number]) -------------------------------------------------------------------------------- Wound Assessment Details Patient Name: [name], [name] E. Date of Service: 07/21/2017 11:15 AM Medical Record Number: [id number] Patient Account Number: [id number] Date of Birth/Sex: 05-22-1929 (84 y.o. F) Treating RN: Montey Hora Primary Care Zaineb Nowaczyk: Eliezer Lofts Other Clinician: Referring Jackee Glasner: Eliezer Lofts Treating Yeslin Delio/Extender: Melburn Hake, HOYT Weeks in Treatment: 2 Wound Status Wound Number: 2 Primary Pressure Ulcer Etiology: Wound Location: Right Calcaneus Wound Healed - Epithelialized Wounding Event: Pressure Injury Status: Date Acquired: 04/03/2017 Comorbid Cataracts, Anemia, Congestive Heart Failure, Weeks Of Treatment: 2 History: Myocardial Infarction, Type II Diabetes, Clustered Wound: No Osteoarthritis, Neuropathy Photos Photo Uploaded By: Roger Shelter on 07/21/2017 16:13:36 Wound Measurements Length: (cm) 0 % Reduct Width: (cm) 0 % Reduct Depth: (cm) 0 Epitheli Area: (cm) 0 Tunneli Volume: (cm) 0 Undermi ion in Area: 100% ion in Volume: 100% alization: Small (1-33%) ng: No ning: No Wound Description Classification: Category/Stage III Wound Margin: Flat and Intact Exudate Amount: Medium Exudate Type: Serous Exudate Color: amber Foul Odor After Cleansing: No Slough/Fibrino Yes Wound Bed Granulation Amount: Small (1-33%) Exposed Structure Granulation Quality: Pink Fascia Exposed: No Necrotic Amount: Large (67-100%) Fat Layer (Subcutaneous Tissue) Exposed: Yes Necrotic Quality: Eschar Tendon Exposed: No Muscle Exposed: No Joint Exposed: No Bone Exposed: No Periwound Skin Texture [name], [name] E. ([id number]) Texture Color No Abnormalities Noted:  No No Abnormalities Noted: No Callus: No Atrophie Blanche: No Crepitus: No Cyanosis: No Excoriation: No Ecchymosis: No Induration: No Erythema: No Rash: No Hemosiderin Staining: No Scarring: No Mottled: No Pallor: No Moisture Rubor: No No Abnormalities Noted: No Dry / Scaly: No Temperature / Pain Maceration: No Temperature: No Abnormality Tenderness on Palpation: Yes Wound Preparation Ulcer Cleansing: Rinsed/Irrigated with Saline Topical Anesthetic Applied: Other: lidocaine 4%, Electronic Signature(s) Signed: 07/21/2017 4:45:51 PM By: Montey Hora Entered By: Montey Hora on 07/21/2017 12:03:25 [name], [name] ([id number]) -------------------------------------------------------------------------------- Vitals Details Patient Name: [name], [name] E. Date of Service: 07/21/2017 11:15 AM Medical Record Number: [id number] Patient Account Number: [id number] Date of Birth/Sex: [birth year] (84 y.o. F) Treating RN: Roger Shelter Primary Care Jude Naclerio: Eliezer Lofts Other Clinician: Referring Ezrael Sam: Eliezer Lofts Treating Emori Kamau/Extender: Melburn Hake, HOYT Weeks in Treatment: 2 Vital Signs Time Taken: 11:07 Temperature (F): 98.1 Height (in): 63 Pulse (bpm): 82 Weight (lbs): 139 Respiratory Rate (breaths/min): 18 Body Mass Index (BMI): 24.6 Blood Pressure (mmHg): 130/58 Reference Range: 80 - 120 mg / dl Electronic Signature(s) Signed: 07/21/2017 4:04:12 PM By: Roger Shelter Entered By: Roger Shelter on 07/21/2017 11:07:23

## 2017-07-25 NOTE — Progress Notes (Signed)
Referring Physician: Dr Joaquim Lai  Patient name: [name] MRN: [id number] DOB: 09-09-1929 Sex: female  REASON FOR CONSULT: right foot wound  HPI: [name] is a 84 y.o. female have followed since 2012 intermittently for peripheral arterial disease.  Her previous symptoms were mainly mild claudication.  Apparently she recently underwent repair of a right hip fracture and developed bilateral heel decubitus and a sacral decubitus ulcer.  She was followed by Dr. Joaquim Lai at the wound center.  The right heel ulcer took several months to heal but recently has completely epithelialized.  She denies rest pain in her feet.  She has chronic numbness and tingling and neuropathy which has been present for several years.  She denies claudication symptoms.  She is minimally ambulatory with a walker.  Other medical problems include anxiety, hyperlipidemia, diabetes all of which are currently stable.  She has significant cardiac dysfunction states that her most recent ejection fraction was in the 20% range.  Past Medical History:  Diagnosis Date  . Anxiety   . Diaphragmatic hernia without mention of obstruction or gangrene   . Diverticulosis of colon (without mention of hemorrhage) 2009  . Fibromyalgia   . GERD (gastroesophageal reflux disease)   . Hiatal hernia 2009   s/p nissen fundoplication 7425.    Marland Kitchen Hx of adenomatous colonic polyps 2007, 2009   Adenomatous polyps in 2007 and 2009.  Hyperplastic polyps in 2009  . Hyperlipidemia   . IBS (irritable bowel syndrome)   . Internal hemorrhoids without mention of complication 9563   Seen at colonoscopy.  . Thyroid disease   . Type II or unspecified type diabetes mellitus with neurological manifestations, not stated as uncontrolled(250.60)    Past Surgical History:  Procedure Laterality Date  . ABDOMINAL HYSTERECTOMY    . BASAL CELL CARCINOMA EXCISION  04/2016   corner of left eye  . bmd  2007  . CATARACT EXTRACTION    .  ESOPHAGOGASTRODUODENOSCOPY  2007  . HERNIA REPAIR    . INTRAMEDULLARY (IM) NAIL INTERTROCHANTERIC Right 03/01/2017   Procedure: INTRAMEDULLARY (IM) NAIL INTERTROCHANTRIC;  Surgeon: Hiram Gash, MD;  Location: [facility];  Service: Orthopedics;  Laterality: Right;  . LAPAROSCOPIC NISSEN FUNDOPLICATION  8756  . ORIF WRIST FRACTURE Right 03/01/2017   Procedure: OPEN REDUCTION INTERNAL FIXATION (ORIF) WRIST FRACTURE;  Surgeon: Hiram Gash, MD;  Location: [facility];  Service: Orthopedics;  Laterality: Right;  . THYROIDECTOMY, PARTIAL      Family History  Problem Relation Age of Onset  . Uterine cancer Mother   . Cancer Mother   . Diabetes Maternal Grandfather   . Diabetes Son   . Hyperlipidemia Son   . Heart disease Paternal Grandmother   . Breast cancer Paternal Aunt   . Colon cancer Neg Hx   . Stomach cancer Neg Hx     SOCIAL HISTORY: Social History   Socioeconomic History  . Marital status: Widowed    Spouse name: Not on file  . Number of children: 3  . Years of education: Not on file  . Highest education level: Not on file  Occupational History  . Occupation: retired    Fish farm manager: RETIRED  Social Needs  . Financial resource strain: Not on file  . Food insecurity:    Worry: Not on file    Inability: Not on file  . Transportation needs:    Medical: Not on file    Non-medical: Not on file  Tobacco Use  . Smoking status:  Former Smoker    Packs/day: 0.25    Years: 4.00    Pack years: 1.00    Types: Cigarettes    Last attempt to quit: 03/10/1981    Years since quitting: 36.4  . Smokeless tobacco: Never Used  Substance and Sexual Activity  . Alcohol use: No    Alcohol/week: 0.0 oz  . Drug use: No  . Sexual activity: Never  Lifestyle  . Physical activity:    Days per week: Not on file    Minutes per session: Not on file  . Stress: Not on file  Relationships  . Social connections:    Talks on phone: Not on file    Gets together: Not on file    Attends religious  service: Not on file    Active member of club or organization: Not on file    Attends meetings of clubs or organizations: Not on file    Relationship status: Not on file  . Intimate partner violence:    Fear of current or ex partner: Not on file    Emotionally abused: Not on file    Physically abused: Not on file    Forced sexual activity: Not on file  Other Topics Concern  . Not on file  Social History Narrative   Widowed   3 children          Allergies  Allergen Reactions  . Codeine     Rapid heart rate  . Metformin     unspecified  . Penicillins     unspecified  . Pioglitazone     REACTION: rash  . Fenofibrate Palpitations    Current Outpatient Medications  Medication Sig Dispense Refill  . acetaminophen (TYLENOL) 500 MG tablet Take 2 tabs po q8 hours x 14 days following surgery 84 tablet 2  . aspirin 81 MG tablet Take 81 mg by mouth daily.      . calcium carbonate (OS-CAL) 1250 (500 Ca) MG chewable tablet Chew 1 tablet by mouth daily.    . cyclobenzaprine (FLEXERIL) 10 MG tablet Take 10 mg by mouth 3 (three) times daily as needed.    Mariane Baumgarten Calcium (STOOL SOFTENER PO) Take 1 tablet by mouth daily as needed.    Marland Kitchen glucose blood (ONE TOUCH ULTRA TEST) test strip USE ONE STRIP TO CHECK GLUCOSE 4 TIMES A DAY 125 each 6  . insulin aspart (NOVOLOG FLEXPEN) 100 UNIT/ML FlexPen Inject 10 Units into the skin 3 (three) times daily with meals. 15 mL 5  . insulin NPH Human (HUMULIN N,NOVOLIN N) 100 UNIT/ML injection Inject 15 Units into the skin at bedtime.     Marland Kitchen omeprazole (PRILOSEC) 20 MG capsule Take 20 mg by mouth 2 (two) times daily before a meal.    . traMADol (ULTRAM) 50 MG tablet Take 1 tablet (50 mg total) by mouth every 6 (six) hours as needed. 50 tablet 0  . atorvastatin (LIPITOR) 40 MG tablet Take 1 tablet (40 mg total) by mouth daily at 6 PM. 30 tablet 11  . carvedilol (COREG) 6.25 MG tablet Take 1 tablet (6.25 mg total) by mouth 2 (two) times daily with a meal.  180 tablet 1  . furosemide (LASIX) 20 MG tablet Take 1 tablet (20 mg total) by mouth daily. 90 tablet 1  . losartan (COZAAR) 25 MG tablet Take 1 tablet (25 mg total) by mouth daily. 90 tablet 1   No current facility-administered medications for this visit.     ROS:   General:  No weight loss, Fever, chills  HEENT: No recent headaches, no nasal bleeding, no visual changes, no sore throat  Neurologic: No dizziness, blackouts, seizures. No recent symptoms of stroke or mini- stroke. No recent episodes of slurred speech, or temporary blindness.  Cardiac: No recent episodes of chest pain/pressure, no shortness of breath at rest.  No shortness of breath with exertion.  Denies history of atrial fibrillation or irregular heartbeat  Vascular: No history of rest pain in feet.  No history of claudication.  No history of non-healing ulcer, No history of DVT   Pulmonary: No home oxygen, no productive cough, no hemoptysis,  No asthma or wheezing  Musculoskeletal:  [X]  Arthritis, [ ]  Low back pain,  [X]  Joint pain  Hematologic:No history of hypercoagulable state.  No history of easy bleeding.  No history of anemia  Gastrointestinal: No hematochezia or melena,  No gastroesophageal reflux, no trouble swallowing  Urinary: [ ]  chronic Kidney disease, [ ]  on HD - [ ]  MWF or [ ]  TTHS, [ ]  Burning with urination, [ ]  Frequent urination, [ ]  Difficulty urinating;   Skin: No rashes  Psychological: No history of anxiety,  No history of depression   Physical Examination  Vitals:   07/25/17 1025  BP: 109/71  Pulse: 71  Resp: 20  Temp: 97.6 F (36.4 C)  TempSrc: Oral  SpO2: 97%  Weight: 142 lb (64.4 kg)  Height: 5\' 3"  (1.6 m)    Body mass index is 25.15 kg/m.  General:  Alert and oriented, no acute distress HEENT: Normal Neck: No bruit or JVD Pulmonary: Clear to auscultation bilaterally Cardiac: Regular Rate and Rhythm without murmur Abdomen: Soft, non-tender, non-distended Skin: No  rash, new appearing skin posterior aspect right heel Extremity Pulses:  2+ radial, brachial, absent femoral, dorsalis pedis, posterior tibial pulses bilaterally Musculoskeletal: No deformity or edema  Neurologic: Upper and lower extremity motor 5/5 and symmetric  DATA:  Patient had bilateral ABIs performed a few weeks ago which were 0.4 on the right 0.48 on the left.  This is compared to 0.6 bilaterally in 2013.  ASSESSMENT: Bilateral heel wounds both have now healed spontaneously.  Certainly her ABIs put her at risk for nonhealing wounds and limb threatening ischemia long-term.  However, in discussions with the patient she is not interested in any further procedures as she has had a very prolonged recovery from her hip fracture and she is concerned about her underlying cardiac dysfunction.  I believe as long as she does not have an open wound this is probably reasonable since she otherwise is asymptomatic.  I discussed with her today protecting her feet and checking her feet daily and calling us if she develops any new wounds.   PLAN: Follow-up in 6 months with repeat ABIs and see our nurse practitioner.  She will follow-up sooner if she develops any new wounds on her feet.  If she developed a new wound we would certainly consider an arteriogram at that point however the patient is a little bit reluctant to proceed with any procedures at this point.  Ruta Hinds, MD [facility] Office: [phone] Pager: [phone]    Ruta Hinds, MD [facility]: [phone] Pager: [phone]

## 2017-07-28 ENCOUNTER — Ambulatory Visit: Payer: PPO | Admitting: Physician Assistant

## 2017-07-28 NOTE — Progress Notes (Signed)
LAVINA, RESOR ([id number]) Visit Report for 07/21/2017 Chief Complaint Document Details Patient Name: [name], [name] Date of Service: 07/21/2017 11:15 AM Medical Record Number: [id number] Patient Account Number: [id number] Date of Birth/Sex: Oct 28, 1929 (84 y.o. F) Treating RN: Montey Hora Primary Care Provider: Eliezer Lofts Other Clinician: Referring Provider: Eliezer Lofts Treating Provider/Extender: Melburn Hake, HOYT Weeks in Treatment: 2 Information Obtained from: Patient Chief Complaint she presents for initial evaluation of a right posterior heel pressure ulcer Electronic Signature(s) Signed: 07/23/2017 12:59:06 AM By: Worthy Keeler PA-C Entered By: Worthy Keeler on 07/21/2017 11:54:37 [place], [place] ([id number]) -------------------------------------------------------------------------------- HPI Details Patient Name: [name] Reining E. Date of Service: 07/21/2017 11:15 AM Medical Record Number: [id number] Patient Account Number: [id number] Date of Birth/Sex: 04-26-1929 (84 y.o. F) Treating RN: Montey Hora Primary Care Provider: Eliezer Lofts Other Clinician: Referring Provider: Eliezer Lofts Treating Provider/Extender: Melburn Hake, HOYT Weeks in Treatment: 2 History of Present Illness Location: right posterior heel Quality: intermittent burning and sharp pains Context: recurrent pressure Modifying Factors: prolonged and unrelieved pressure delay wound healing HPI Description: 05/09/17-she is here for initial evaluation of a right posterior heel ulcer, accompanied by her daughter. She was hospitalized in November for right hip fracture and repair. 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 She states that during her hospitalization she developed multiple pressure ulcers, the only remaining is the posterior right heel. Home health has been following. She has applied a variety of ointments including Vaseline and  Desitin to the right heel. She states that approximately 4 years ago she followed with Dr. Oneida Alar at [facility] secondary to claudication. She states there is "nothing they can do" and denies ever having intervention. She is a former smoker, quitting >30 years ago She is a diabetic, her last A1c was 6.6 in December. ABIs in the office were unable to be obtained. There is soft lifted eschar to the ulcer which has been selectively debrided. I recommended a second opinion from vascular medicine, the would prefer to stay local, therefore a referral to [facility] has been sent. We will apply Santyl daily; she has been advised to contact the clinic as soon as possible if she is unable to afford the medication. She has been educated on the need for aggressive offloading to the right heel. 05/19/17 on evaluation today patient appears to be doing rather well in regard to her right posterior heel ulcer. She has been tolerating the dressing changes without complication. With that being said I am very pleased with how things have been going at this point in time from what I see at this point. Patient likewise is happy with the progress more specifically her daughter is to has been taking care of this ulcer in particular. She has been using Santyl currently with good result. 05/26/17 on evaluation today patient appears to be doing about the same in regard to her ulcer at this point. She has been tolerating the dressing changes. We have been using Santyl to help clean up the ulcer bed. She does have her appointment with [facility] on 05/30/17. Hopefully they will be able to determine her blood flow and whether any intervention may be necessary or not. With that being said she fortunately is doing well not having significant pain at this time. 06/05/17 on evaluation today patient appears to be doing much better in regard to her right heel wound. She has been tolerating  the dressing changes without complication. She is continuing to utilize the [place] at this point and this does seem to be doing very well for her. Overall I'm pleased with the progress that she has made in the interim from last week to this week she has much more granulation apparent in the base of the wound and there does not appear to be any evidence of infection. Her daughter has been doing very well caring for and cleaning the wound. Admission to [facility]: 06/14/17 on evaluation today patient actually appears to be doing excellent in regard to her right heel ulcer. It appears to be more and shows excellent signs of improvement since last week's evaluation. She has been tolerating the dressing changes well other than the fact that she thought the Santyl might be causing some burning although we switch to hydrogen when she still experience the burning. I think this may be more of an underlying arterial issue or potentially just a neuropathy type issue. 06/21/17 on evaluation today patient appears to be doing very well in regard to her wound. She is making good progress as far as evaluation is concerned at this point. She has been tolerating the dressing changes with the silver collagen without any complication. Fortunately there appears to be no evidence of infection. She does have her appointment with vascular on April 8 and then the follow up appointment to discuss the results on April 11. Otherwise there does not appear to be any evidence of anything worsening at this time. 06/28/17 on evaluation today patient appears to be doing better in regard to her right heel ulcer. We are seeing more [place], [place] ([id number]) granulation noted she did have some dry skin which was actually easily removed just with saline and gauze today in the periphery of the wound. After removal this appears to even be better as far as the overall appearance of the wound is concerned. We are still waiting on  vascular referral. Readmission back to [facility]: 07/07/17 on evaluation today patient is seen for reevaluation here in the [place] clinic. She is actually been seen in the past three weeks by myself in [place]. However she was really not happy with the wait time she tells me. They were often having to wait two and half hours which really did not make any sense considering how quickly they get in and out of the clinic here they tell me. Therefore they decided to transfer back to the [facility] wound center. Nonetheless she has been doing excellent at this point in time. We have been using Prisma with Drawtex over top and then a heel cup followed by Kerlex to secure in place. She states at this point in time this has been doing very well in fact the wound does appear to be much better today. 07/14/17 on evaluation today patient presents concerning her right heel ulcer. She actually appears to be doing very well today was just a small opening once the eschar and dressing that were dried on were removed. I'm very pleased with how things seem to be progressing. 07/21/17 on evaluation today patient appears to be completely healed regard to her heel ulcer. She has been following everything we've recommended from the standpoint of dressing changes great news is she appears to have done excellent. He is having no significant pain at this point Electronic Signature(s) Signed: 07/23/2017 12:59:06 AM By: Worthy Keeler PA-C Entered By: Worthy Keeler on 07/23/2017 00:11:45 Matkins, Imaya E. ([id number]) -------------------------------------------------------------------------------- Physical Exam  Details Patient Name: [name], [name] Date of Service: 07/21/2017 11:15 AM Medical Record Number: [id number] Patient Account Number: [id number] Date of Birth/Sex: [birth year] (84 y.o. F) Treating RN: Montey Hora Primary Care Provider: Eliezer Lofts Other Clinician: Referring Provider: Eliezer Lofts Treating Provider/Extender: Melburn Hake, HOYT Weeks in Treatment: 2 Constitutional Well-nourished and well-hydrated in no acute distress. Respiratory normal breathing without difficulty. Psychiatric this patient is able to make decisions and demonstrates good insight into disease process. Alert and Oriented x 3. pleasant and cooperative. Notes Patient's wound shows signs of being completely healed and there is no further issues noted at this point. Patient is very pleased with the progress made. Electronic Signature(s) Signed: 07/23/2017 12:59:06 AM By: Worthy Keeler PA-C Entered By: Worthy Keeler on 07/23/2017 00:12:30 [name] ([id number]) -------------------------------------------------------------------------------- Physician Orders Details Patient Name: [name], [name] E. Date of Service: 07/21/2017 11:15 AM Medical Record Number: [id number] Patient Account Number: [id number] Date of Birth/Sex: [birth year] (84 y.o. F) Treating RN: Montey Hora Primary Care Provider: Eliezer Lofts Other Clinician: Referring Provider: Eliezer Lofts Treating Provider/Extender: Melburn Hake, HOYT Weeks in Treatment: 2 Verbal / Phone Orders: No Diagnosis Coding ICD-10 Coding Code Description L89.613 Pressure ulcer of right heel, stage 3 I70.234 Atherosclerosis of native arteries of right leg with ulceration of heel and midfoot R54 Age-related physical debility Discharge From [place] o Discharge from [place] Signature(s) Signed: 07/21/2017 4:45:51 PM By: Montey Hora Signed: 07/23/2017 12:59:06 AM By: Worthy Keeler PA-C Entered By: Montey Hora on 07/21/2017 12:04:07 [place]uma, Vy E. ([id number]) -------------------------------------------------------------------------------- Problem List Details Patient Name: [name], [name] E. Date of Service: 07/21/2017 11:15 AM Medical Record Number: [id number] Patient Account Number: [id number] Date of Birth/Sex:  Jul 28, 1929 (84 y.o. F) Treating RN: Montey Hora Primary Care Provider: Eliezer Lofts Other Clinician: Referring Provider: Eliezer Lofts Treating Provider/Extender: Melburn Hake, HOYT Weeks in Treatment: 2 Active Problems ICD-10 Impacting Encounter Code Description Active Date Wound Healing Diagnosis L89.613 Pressure ulcer of right heel, stage 3 07/07/2017 Yes I70.234 Atherosclerosis of native arteries of right leg with ulceration of 07/07/2017 Yes heel and midfoot R54 Age-related physical debility 07/07/2017 Yes Inactive Problems Resolved Problems Electronic Signature(s) Signed: 07/23/2017 12:59:06 AM By: Worthy Keeler PA-C Entered By: Worthy Keeler on 07/21/2017 11:54:30 Bossman, Keyonda E. ([id number]) -------------------------------------------------------------------------------- Progress Note Details Patient Name: [name], [name] E. Date of Service: 07/21/2017 11:15 AM Medical Record Number: [id number] Patient Account Number: [id number] Date of Birth/Sex: 21-Jun-1929 (84 y.o. F) Treating RN: Montey Hora Primary Care Provider: Eliezer Lofts Other Clinician: Referring Provider: Eliezer Lofts Treating Provider/Extender: Melburn Hake, HOYT Weeks in Treatment: 2 Subjective Chief Complaint Information obtained from Patient she presents for initial evaluation of a right posterior heel pressure ulcer History of Present Illness (HPI) The following HPI elements were documented for the patient's wound: Location: right posterior heel Quality: intermittent burning and sharp pains Context: recurrent pressure Modifying Factors: prolonged and unrelieved pressure delay wound healing 05/09/17-she is here for initial evaluation of a right posterior heel ulcer, accompanied by her daughter. She was hospitalized in November for right hip fracture and repair. 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 She states that during her hospitalization she  developed multiple pressure ulcers, the only remaining is the posterior right heel. Home health has been following. She has applied a variety of ointments including Vaseline and Desitin to the right heel. She states that approximately 4 years ago she followed with Dr. Oneida Alar at  [facility] secondary to claudication. She states there is "nothing they can do" and denies ever having intervention. She is a former smoker, quitting >30 years ago She is a diabetic, her last A1c was 6.6 in December. ABIs in the office were unable to be obtained. There is soft lifted eschar to the ulcer which has been selectively debrided. I recommended a second opinion from vascular medicine, the would prefer to stay local, therefore a referral to [facility] has been sent. We will apply Santyl daily; she has been advised to contact the clinic as soon as possible if she is unable to afford the medication. She has been educated on the need for aggressive offloading to the right heel. 05/19/17 on evaluation today patient appears to be doing rather well in regard to her right posterior heel ulcer. She has been tolerating the dressing changes without complication. With that being said I am very pleased with how things have been going at this point in time from what I see at this point. Patient likewise is happy with the progress more specifically her daughter is to has been taking care of this ulcer in particular. She has been using Santyl currently with good result. 05/26/17 on evaluation today patient appears to be doing about the same in regard to her ulcer at this point. She has been tolerating the dressing changes. We have been using Santyl to help clean up the ulcer bed. She does have her appointment with [facility] on 05/30/17. Hopefully they will be able to determine her blood flow and whether any intervention may be necessary or not. With that being said she fortunately is  doing well not having significant pain at this time. 06/05/17 on evaluation today patient appears to be doing much better in regard to her right heel wound. She has been tolerating the dressing changes without complication. She is continuing to utilize the [place] at this point and this does seem to be doing very well for her. Overall I'm pleased with the progress that she has made in the interim from last week to this week she has much more granulation apparent in the base of the wound and there does not appear to be any evidence of infection. Her daughter has been doing very well caring for and cleaning the wound. Admission to [facility]: 06/14/17 on evaluation today patient actually appears to be doing excellent in regard to her right heel ulcer. It appears to be more and shows excellent signs of improvement since last week's evaluation. She has been tolerating the dressing changes well other than the fact that she thought the Santyl might be causing some burning although we switch to hydrogen when she still experience the burning. I think this may be more of an underlying arterial issue or potentially just a neuropathy type issue. [name], [name] ([id number]) 06/21/17 on evaluation today patient appears to be doing very well in regard to her wound. She is making good progress as far as evaluation is concerned at this point. She has been tolerating the dressing changes with the silver collagen without any complication. Fortunately there appears to be no evidence of infection. She does have her appointment with vascular on April 8 and then the follow up appointment to discuss the results on April 11. Otherwise there does not appear to be any evidence of anything worsening at this time. 06/28/17 on evaluation today patient appears to be doing better in regard to her right heel  ulcer. We are seeing more granulation noted she did have some dry skin which was actually easily removed just with saline  and gauze today in the periphery of the wound. After removal this appears to even be better as far as the overall appearance of the wound is concerned. We are still waiting on vascular referral. Readmission back to [facility]: 07/07/17 on evaluation today patient is seen for reevaluation here in the [place] clinic. She is actually been seen in the past three weeks by myself in [place]. However she was really not happy with the wait time she tells me. They were often having to wait two and half hours which really did not make any sense considering how quickly they get in and out of the clinic here they tell me. Therefore they decided to transfer back to the [facility] wound center. Nonetheless she has been doing excellent at this point in time. We have been using Prisma with Drawtex over top and then a heel cup followed by Kerlex to secure in place. She states at this point in time this has been doing very well in fact the wound does appear to be much better today. 07/14/17 on evaluation today patient presents concerning her right heel ulcer. She actually appears to be doing very well today was just a small opening once the eschar and dressing that were dried on were removed. I'm very pleased with how things seem to be progressing. 07/21/17 on evaluation today patient appears to be completely healed regard to her heel ulcer. She has been following everything we've recommended from the standpoint of dressing changes great news is she appears to have done excellent. He is having no significant pain at this point Patient History Information obtained from Patient. Family History Cancer - Mother,Child, Diabetes - Child, Hypertension - Child, Thyroid Problems - Child, No family history of Heart Disease, Hereditary Spherocytosis, Kidney Disease, Lung Disease, Seizures, Stroke, Tuberculosis. Social History Former smoker - quit 50 yrs ago, Marital Status - Widowed, Alcohol Use - Never, Drug Use  - No History, Caffeine Use - Daily. Review of Systems (ROS) Constitutional Symptoms (General Health) Denies complaints or symptoms of Fever, Chills. Psychiatric The patient has no complaints or symptoms. Objective Constitutional Well-nourished and well-hydrated in no acute distress. Klemann, [name] ([id number]) Vitals Time Taken: 11:07 AM, Height: 63 in, Weight: 139 lbs, BMI: 24.6, Temperature: 98.1 F, Pulse: 82 bpm, Respiratory Rate: 18 breaths/min, Blood Pressure: 130/58 mmHg. Respiratory normal breathing without difficulty. Psychiatric this patient is able to make decisions and demonstrates good insight into disease process. Alert and Oriented x 3. pleasant and cooperative. General Notes: Patient's wound shows signs of being completely healed and there is no further issues noted at this point. Patient is very pleased with the progress made. Integumentary (Hair, Skin) Wound #2 status is Healed - Epithelialized. Original cause of wound was Pressure Injury. The wound is located on the Right Calcaneus. The wound measures 0cm length x 0cm width x 0cm depth; 0cm^2 area and 0cm^3 volume. There is Fat Layer (Subcutaneous Tissue) Exposed exposed. There is no tunneling or undermining noted. There is a medium amount of serous drainage noted. The wound margin is flat and intact. There is small (1-33%) pink granulation within the wound bed. There is a large (67-100%) amount of necrotic tissue within the wound bed including Eschar. The periwound skin appearance did not exhibit: Callus, Crepitus, Excoriation, Induration, Rash, Scarring, Dry/Scaly, Maceration, Atrophie Blanche, Cyanosis, Ecchymosis, Hemosiderin Staining, Mottled, Pallor, Rubor, Erythema. Periwound temperature  was noted as No Abnormality. The periwound has tenderness on palpation. Assessment Active Problems ICD-10 L89.613 - Pressure ulcer of right heel, stage 3 I70.234 - Atherosclerosis of native arteries of right leg with  ulceration of heel and midfoot R54 - Age-related physical debility Plan Discharge From [facility]: Discharge from Agency recommend that we discontinue wound care services and we will see the patient back in the future as needed if anything changes or worsens. She's in agreement with this plan. Otherwise it's been a pleasure caring for her. Electronic Signature(s) Signed: 07/23/2017 12:59:06 AM By: Kathryne Gin, [place] ([id number]) Entered By: Worthy Keeler on 07/23/2017 00:12:42 [name] ([id number]) -------------------------------------------------------------------------------- ROS/PFSH Details Patient Name: [name], [name] E. Date of Service: 07/21/2017 11:15 AM Medical Record Number: [id number] Patient Account Number: [id number] Date of Birth/Sex: 04-14-1929 (84 y.o. F) Treating RN: Montey Hora Primary Care Provider: Eliezer Lofts Other Clinician: Referring Provider: Eliezer Lofts Treating Provider/Extender: Melburn Hake, HOYT Weeks in Treatment: 2 Information Obtained From Patient Wound History Do you currently have one or more open woundso Yes How many open wounds do you currently haveo 1 Approximately how long have you had your woundso 3 months Has your wound(s) ever healed and then re-openedo No Have you had any lab work done in the past montho No Have you tested positive for an antibiotic resistant organism (MRSA, VRE)o No Have you tested positive for osteomyelitis (bone infection)o No Have you had any tests for circulation on your legso No Constitutional Symptoms (General Health) Complaints and Symptoms: Negative for: Fever; Chills Eyes Medical History: Positive for: Cataracts - surgery Hematologic/Lymphatic Medical History: Positive for: Anemia Cardiovascular Medical History: Positive for: Congestive Heart Failure; Myocardial Infarction Endocrine Medical History: Positive for: Type II Diabetes Time with  diabetes: 15 yrs Treated with: Insulin Blood sugar tested every day: Yes Tested : Musculoskeletal Medical History: Positive for: Osteoarthritis Neurologic [name], [name] E. ([id number]) Medical History: Positive for: Neuropathy Psychiatric Complaints and Symptoms: No Complaints or Symptoms HBO Extended History Items Eyes: Cataracts Immunizations Pneumococcal Vaccine: Received Pneumococcal Vaccination: Yes Implantable Devices Family and Social History Cancer: Yes - Mother,Child; Diabetes: Yes - Child; Heart Disease: No; Hereditary Spherocytosis: No; Hypertension: Yes - Child; Kidney Disease: No; Lung Disease: No; Seizures: No; Stroke: No; Thyroid Problems: Yes - Child; Tuberculosis: No; Former smoker - quit 50 yrs ago; Marital Status - Widowed; Alcohol Use: Never; Drug Use: No History; Caffeine Use: Daily; Financial Concerns: No; Food, Clothing or Shelter Needs: No; Support System Lacking: No; Transportation Concerns: No; Advanced Directives: No; Patient does not want information on Advanced Directives; Do not resuscitate: No; Living Will: Yes (Not Provided); Medical Power of Attorney: No Physician Affirmation I have reviewed and agree with the above information. Electronic Signature(s) Signed: 07/23/2017 12:59:06 AM By: Worthy Keeler PA-C Signed: 07/24/2017 4:05:33 PM By: Montey Hora Entered By: Worthy Keeler on 07/23/2017 00:12:06 [name], [name] [place] ([id number]) -------------------------------------------------------------------------------- SuperBill Details Patient Name: ASTI, MACKLEY E. Date of Service: 07/21/2017 Medical Record Number: [id number] Patient Account Number: [id number] Date of Birth/Sex: April 13, 1929 (84 y.o. F) Treating RN: Montey Hora Primary Care Provider: Eliezer Lofts Other Clinician: Referring Provider: Eliezer Lofts Treating Provider/Extender: Melburn Hake, HOYT Weeks in Treatment: 2 Diagnosis Coding ICD-10 Codes Code Description L89.613 Pressure  ulcer of right heel, stage 3 I70.234 Atherosclerosis of native arteries of right leg with ulceration of heel and midfoot R54 Age-related physical debility Facility Procedures CPT4 Code: [id number] Description: 85277 - WOUND CARE VISIT-LEV  2 EST PT Modifier: Quantity: 1 Physician Procedures CPT4 Code Description: [id number] 98102 - WC PHYS LEVEL 2 - EST PT ICD-10 Diagnosis Description L89.613 Pressure ulcer of right heel, stage 3 I70.234 Atherosclerosis of native arteries of right leg with ulceration R54 Age-related physical debility Modifier: of heel and midfo Quantity: 1 ot Electronic Signature(s) Signed: 07/23/2017 12:59:06 AM By: Worthy Keeler PA-C Entered By: Worthy Keeler on 07/23/2017 00:13:25

## 2017-08-21 ENCOUNTER — Other Ambulatory Visit: Payer: Self-pay

## 2017-08-21 DIAGNOSIS — I739 Peripheral vascular disease, unspecified: Secondary | ICD-10-CM

## 2017-08-24 DIAGNOSIS — M25551 Pain in right hip: Secondary | ICD-10-CM | POA: Diagnosis not present

## 2017-09-21 ENCOUNTER — Encounter: Payer: Self-pay | Admitting: Family Medicine

## 2017-09-21 ENCOUNTER — Ambulatory Visit (INDEPENDENT_AMBULATORY_CARE_PROVIDER_SITE_OTHER): Payer: PPO | Admitting: Family Medicine

## 2017-09-21 VITALS — BP 120/80 | HR 74 | Temp 97.8°F | Ht 63.5 in | Wt 140.5 lb

## 2017-09-21 DIAGNOSIS — R300 Dysuria: Secondary | ICD-10-CM

## 2017-09-21 LAB — POC URINALSYSI DIPSTICK (AUTOMATED)
Bilirubin, UA: NEGATIVE
Glucose, UA: NEGATIVE
Ketones, UA: NEGATIVE
NITRITE UA: NEGATIVE
PH UA: 6.5 (ref 5.0–8.0)
Protein, UA: NEGATIVE
RBC UA: NEGATIVE
SPEC GRAV UA: 1.01 (ref 1.010–1.025)
UROBILINOGEN UA: 0.2 U/dL

## 2017-09-21 MED ORDER — SULFAMETHOXAZOLE-TRIMETHOPRIM 800-160 MG PO TABS
1.0000 | ORAL_TABLET | Freq: Two times a day (BID) | ORAL | 0 refills | Status: DC
Start: 2017-09-21 — End: 2018-01-05

## 2017-09-21 NOTE — Assessment & Plan Note (Signed)
Appears consistent with UTI. Send for culture and start antibitoics x 3 day as uncomplicated.

## 2017-09-21 NOTE — Patient Instructions (Signed)
Start antibiotics.  We will send urine for culture and let you know.  Push fluids  primarily water.

## 2017-09-21 NOTE — Progress Notes (Signed)
   Subjective:    Patient ID: [name], female    DOB: 12-30-1929, 84 y.o.   MRN: [id number]  Dysuria   This is a new problem. The current episode started 1 to 4 weeks ago. The problem occurs every urination. The problem has been unchanged. The quality of the pain is described as burning. The pain is moderate. There has been no fever. She is not sexually active. There is no history of pyelonephritis. Associated symptoms include frequency, hematuria and urgency. Pertinent negatives include no chills, discharge, flank pain, hesitancy or vomiting. Associated symptoms comments:  No vaginal discharge, no vaginal irritation. She has tried nothing for the symptoms. There is no history of catheterization, recurrent UTIs, a single kidney or a urological procedure.    diet coke, drinks OJ off and on  1 cup coffee a day.  no ETOH,     Review of Systems  Constitutional: Negative for chills.  Gastrointestinal: Negative for vomiting.  Genitourinary: Positive for dysuria, frequency, hematuria and urgency. Negative for flank pain and hesitancy.       Objective:   Physical Exam  Constitutional: Vital signs are normal. She appears well-developed and well-nourished. She is cooperative.  Non-toxic appearance. She does not appear ill. No distress.  HENT:  Head: Normocephalic.  Right Ear: Hearing, tympanic membrane, external ear and ear canal normal. Tympanic membrane is not erythematous, not retracted and not bulging.  Left Ear: Hearing, tympanic membrane, external ear and ear canal normal. Tympanic membrane is not erythematous, not retracted and not bulging.  Nose: No mucosal edema or rhinorrhea. Right sinus exhibits no maxillary sinus tenderness and no frontal sinus tenderness. Left sinus exhibits no maxillary sinus tenderness and no frontal sinus tenderness.  Mouth/Throat: Uvula is midline, oropharynx is clear and moist and mucous membranes are normal.  Eyes: Pupils are equal, round, and reactive to  light. Conjunctivae, EOM and lids are normal. Lids are everted and swept, no foreign bodies found.  Neck: Trachea normal and normal range of motion. Neck supple. Carotid bruit is not present. No thyroid mass and no thyromegaly present.  Cardiovascular: Normal rate, regular rhythm, S1 normal, S2 normal, normal heart sounds, intact distal pulses and normal pulses. Exam reveals no gallop and no friction rub.  No murmur heard. Pulmonary/Chest: Effort normal and breath sounds normal. No tachypnea. No respiratory distress. She has no decreased breath sounds. She has no wheezes. She has no rhonchi. She has no rales.  Abdominal: Soft. Normal appearance and bowel sounds are normal. There is no tenderness. There is no CVA tenderness.  Neurological: She is alert.  Skin: Skin is warm, dry and intact. No rash noted.  Psychiatric: Her speech is normal and behavior is normal. Judgment and thought content normal. Her mood appears not anxious. Cognition and memory are normal. She does not exhibit a depressed mood.          Assessment & Plan:

## 2017-09-22 LAB — URINE CULTURE
MICRO NUMBER:: 90710717
SPECIMEN QUALITY:: ADEQUATE

## 2017-10-24 DIAGNOSIS — H01021 Squamous blepharitis right upper eyelid: Secondary | ICD-10-CM | POA: Diagnosis not present

## 2017-10-24 DIAGNOSIS — C44311 Basal cell carcinoma of skin of nose: Secondary | ICD-10-CM | POA: Diagnosis not present

## 2017-10-24 DIAGNOSIS — H01025 Squamous blepharitis left lower eyelid: Secondary | ICD-10-CM | POA: Diagnosis not present

## 2017-10-24 DIAGNOSIS — H01022 Squamous blepharitis right lower eyelid: Secondary | ICD-10-CM | POA: Diagnosis not present

## 2017-10-24 DIAGNOSIS — B0051 Herpesviral iridocyclitis: Secondary | ICD-10-CM | POA: Diagnosis not present

## 2017-10-24 DIAGNOSIS — H40053 Ocular hypertension, bilateral: Secondary | ICD-10-CM | POA: Diagnosis not present

## 2017-10-24 DIAGNOSIS — B0059 Other herpesviral disease of eye: Secondary | ICD-10-CM | POA: Diagnosis not present

## 2017-10-24 DIAGNOSIS — H01024 Squamous blepharitis left upper eyelid: Secondary | ICD-10-CM | POA: Diagnosis not present

## 2017-10-24 DIAGNOSIS — Z961 Presence of intraocular lens: Secondary | ICD-10-CM | POA: Diagnosis not present

## 2017-10-24 LAB — HM DIABETES EYE EXAM

## 2017-11-02 ENCOUNTER — Encounter: Payer: Self-pay | Admitting: Optometry

## 2017-11-13 ENCOUNTER — Other Ambulatory Visit: Payer: Self-pay | Admitting: Family Medicine

## 2017-12-18 ENCOUNTER — Other Ambulatory Visit: Payer: Self-pay | Admitting: Family Medicine

## 2017-12-18 ENCOUNTER — Telehealth: Payer: Self-pay | Admitting: Family Medicine

## 2017-12-18 NOTE — Telephone Encounter (Signed)
Copied from [place] [id number]. Topic: Quick Communication - Rx Refill/Question >> Dec 18, 2017  8:59 AM Wynetta Emery, [place] C wrote: Medication: carvedilol (COREG) 6.25 MG tablet, furosemide (LASIX) 20 MG tablet, losartan (COZAAR) 25 MG tablet --- pt says that she will be out after today. Advised of Refill policy.   Has the patient contacted their pharmacy? No   (Agent: If no, request that the patient contact the pharmacy for the refill.) (Agent: If yes, when and what did the pharmacy advise?)  Preferred Pharmacy (with phone number or street name): [facility] ([address]), [place] [phone] (Phone) [phone] (Fax)    Agent: Please be advised that RX refills may take up to 3 business days. We ask that you follow-up with your pharmacy.

## 2018-01-05 ENCOUNTER — Encounter (INDEPENDENT_AMBULATORY_CARE_PROVIDER_SITE_OTHER): Payer: Self-pay

## 2018-01-05 ENCOUNTER — Telehealth: Payer: Self-pay

## 2018-01-05 ENCOUNTER — Encounter: Payer: Self-pay | Admitting: Family Medicine

## 2018-01-05 ENCOUNTER — Ambulatory Visit: Payer: PPO | Admitting: Family Medicine

## 2018-01-05 VITALS — BP 116/62 | HR 72 | Temp 97.6°F | Ht 63.5 in | Wt 141.5 lb

## 2018-01-05 DIAGNOSIS — E1129 Type 2 diabetes mellitus with other diabetic kidney complication: Secondary | ICD-10-CM

## 2018-01-05 DIAGNOSIS — Z8781 Personal history of (healed) traumatic fracture: Secondary | ICD-10-CM | POA: Diagnosis not present

## 2018-01-05 DIAGNOSIS — E1149 Type 2 diabetes mellitus with other diabetic neurological complication: Secondary | ICD-10-CM | POA: Diagnosis not present

## 2018-01-05 DIAGNOSIS — I10 Essential (primary) hypertension: Secondary | ICD-10-CM

## 2018-01-05 DIAGNOSIS — I252 Old myocardial infarction: Secondary | ICD-10-CM | POA: Diagnosis not present

## 2018-01-05 DIAGNOSIS — I6523 Occlusion and stenosis of bilateral carotid arteries: Secondary | ICD-10-CM | POA: Diagnosis not present

## 2018-01-05 DIAGNOSIS — I739 Peripheral vascular disease, unspecified: Secondary | ICD-10-CM

## 2018-01-05 DIAGNOSIS — R809 Proteinuria, unspecified: Secondary | ICD-10-CM | POA: Diagnosis not present

## 2018-01-05 DIAGNOSIS — E1159 Type 2 diabetes mellitus with other circulatory complications: Secondary | ICD-10-CM

## 2018-01-05 DIAGNOSIS — E1142 Type 2 diabetes mellitus with diabetic polyneuropathy: Secondary | ICD-10-CM

## 2018-01-05 LAB — POCT GLYCOSYLATED HEMOGLOBIN (HGB A1C): HEMOGLOBIN A1C: 6.1 % — AB (ref 4.0–5.6)

## 2018-01-05 LAB — HM DIABETES FOOT EXAM

## 2018-01-05 NOTE — Telephone Encounter (Signed)
Spoke with patient about getting appointment set up with cardiologist. Patient stated that she did not know she needed to go for that, she states she knows about vein and vascular doctor appointment but as far as she understood cardiology follow up was not mentioned to be needed at this time. She did see Dr Burt Knack cardiologist before she thinks. I advised patient I would double check before proceeding. Thank Edrick Kins, RMA

## 2018-01-05 NOTE — Telephone Encounter (Signed)
I spoke with pt today about my recommendations. I recommend cardiology appt to follow up on her heart attack earlier in the year.  If she refuses, just document, please. Thanks!

## 2018-01-05 NOTE — Assessment & Plan Note (Signed)
Followed by vascular 

## 2018-01-05 NOTE — Assessment & Plan Note (Addendum)
Stabel control.

## 2018-01-05 NOTE — Assessment & Plan Note (Addendum)
Need repeat eval 01/2018 if not done by vascular

## 2018-01-05 NOTE — Assessment & Plan Note (Signed)
Excellent control on current regimen. 

## 2018-01-05 NOTE — Assessment & Plan Note (Addendum)
She never saw cardiology in follow up. On  aggressive medical management on asa, losartan, coreg,  Statin. Given fatigue and SOB.. Dicussed follow up with cardiology.

## 2018-01-05 NOTE — Progress Notes (Signed)
Subjective:    Patient ID: [name], female    DOB: 09-07-1929, 84 y.o.   MRN: [id number]  HPI   84 year old female presents for DM follow up  She has been having pain in right hip off and on. Hx right hip fracture.. Has pin in place. Using a walker to get around. Needs a new one.   Diabetes:  Excellent control on current regimen but has not been able to eat well given bottom teeth removed.. Only eating soup, fish , mashed potatos Lab Results  Component Value Date   HGBA1C 6.1 (A) 01/05/2018  Using medications without difficulties: Hypoglycemic episodes:none Hyperglycemic episodes:none Feet problems:no ulcers Blood Sugars averaging: FBS not checking, 2 hour after 191 eye exam within last year:10/2017 Wt Readings from Last 3 Encounters:  01/05/18 141 lb 8 oz (64.2 kg)  09/21/17 140 lb 8 oz (63.7 kg)  07/25/17 142 lb (64.4 kg)     Hypertension:   Good control on current regimen.  BP Readings from Last 3 Encounters:  01/05/18 116/62  09/21/17 120/80  07/25/17 109/71  Using medication without problems or lightheadedness:  Chest pain with exertion:none Edema:none Short of breath: moderate Average home BPs: Other issues:   On statin atorvastatin.  Lab Results  Component Value Date   CHOL 248 (H) 06/23/2016   HDL 38.40 (L) 06/23/2016   LDLCALC 79 04/13/2007   LDLDIRECT 101.0 06/23/2016   TRIG (H) 06/23/2016    406.0 Triglyceride is over 400; calculations on Lipids are invalid.   CHOLHDL 6 06/23/2016     Blood pressure 116/62, pulse 72, temperature 97.6 F (36.4 C), temperature source Oral, height 5' 3.5" (1.613 m), weight 141 lb 8 oz (64.2 kg).  Review of Systems  Constitutional: Positive for fatigue. Negative for fever.  HENT: Negative for congestion.   Eyes: Negative for pain.  Respiratory: Positive for shortness of breath. Negative for cough.   Cardiovascular: Negative for chest pain, palpitations and leg swelling.  Gastrointestinal: Negative for  abdominal pain.  Genitourinary: Negative for dysuria and vaginal bleeding.  Musculoskeletal: Negative for back pain.  Neurological: Negative for syncope, light-headedness and headaches.  Psychiatric/Behavioral: Negative for dysphoric mood.       Objective:   Physical Exam  Constitutional: Vital signs are normal. She appears well-developed and well-nourished. She is cooperative.  Non-toxic appearance. She does not appear ill. No distress.  Elderly female in NAD  HENT:  Head: Normocephalic.  Right Ear: Hearing, tympanic membrane, external ear and ear canal normal. Tympanic membrane is not erythematous, not retracted and not bulging.  Left Ear: Hearing, tympanic membrane, external ear and ear canal normal. Tympanic membrane is not erythematous, not retracted and not bulging.  Nose: No mucosal edema or rhinorrhea. Right sinus exhibits no maxillary sinus tenderness and no frontal sinus tenderness. Left sinus exhibits no maxillary sinus tenderness and no frontal sinus tenderness.  Mouth/Throat: Uvula is midline, oropharynx is clear and moist and mucous membranes are normal.  Eyes: Pupils are equal, round, and reactive to light. Conjunctivae, EOM and lids are normal. Lids are everted and swept, no foreign bodies found.  Neck: Trachea normal and normal range of motion. Neck supple. Carotid bruit is not present. No thyroid mass and no thyromegaly present.  Cardiovascular: Normal rate, regular rhythm, S1 normal, S2 normal, intact distal pulses and normal pulses. Exam reveals no gallop and no friction rub.  Murmur heard.  Systolic murmur is present. Pulmonary/Chest: Effort normal and breath sounds normal. No tachypnea. No  respiratory distress. She has no decreased breath sounds. She has no wheezes. She has no rhonchi. She has no rales.  Abdominal: Soft. Normal appearance and bowel sounds are normal. There is no tenderness.  Neurological: She is alert.  Skin: Skin is warm, dry and intact. No rash  noted.  Psychiatric: Her speech is normal and behavior is normal. Judgment and thought content normal. Her mood appears not anxious. Cognition and memory are normal. She does not exhibit a depressed mood.     Diabetic foot exam: Normal inspection except bunions No skin breakdown.Marland Kitchen Healing wound with dry skin right heel No calluses  Normal DP pulses Normal sensation to light touch and monofilament Nails thickened and deformed      Assessment & Plan:

## 2018-01-05 NOTE — Assessment & Plan Note (Signed)
Well controlled. Continue current medication.  

## 2018-01-05 NOTE — Patient Instructions (Addendum)
If vascular doctor does not check carotid dopplers we will need to recheck in 01/2018. Please stop at the front desk to set up referral.

## 2018-01-05 NOTE — Assessment & Plan Note (Signed)
On ACE-I 

## 2018-01-08 NOTE — Telephone Encounter (Signed)
Spoke with patient. Appointment made to see Dr Radford Pax on 01/17/18 10:20 am. Dr Burt Knack is booked out till next Deboraha Sprang, RMA

## 2018-01-10 DIAGNOSIS — S42301A Unspecified fracture of shaft of humerus, right arm, initial encounter for closed fracture: Secondary | ICD-10-CM | POA: Diagnosis not present

## 2018-01-10 DIAGNOSIS — R269 Unspecified abnormalities of gait and mobility: Secondary | ICD-10-CM | POA: Diagnosis not present

## 2018-01-10 DIAGNOSIS — R296 Repeated falls: Secondary | ICD-10-CM | POA: Diagnosis not present

## 2018-01-17 ENCOUNTER — Ambulatory Visit (INDEPENDENT_AMBULATORY_CARE_PROVIDER_SITE_OTHER): Payer: PPO | Admitting: Cardiovascular Disease

## 2018-01-17 ENCOUNTER — Encounter: Payer: PPO | Admitting: Cardiology

## 2018-01-17 ENCOUNTER — Encounter: Payer: Self-pay | Admitting: Cardiology

## 2018-01-17 ENCOUNTER — Encounter: Payer: Self-pay | Admitting: Cardiovascular Disease

## 2018-01-17 VITALS — BP 140/68 | HR 64 | Ht 63.0 in | Wt 145.0 lb

## 2018-01-17 DIAGNOSIS — I350 Nonrheumatic aortic (valve) stenosis: Secondary | ICD-10-CM | POA: Diagnosis not present

## 2018-01-17 DIAGNOSIS — I5023 Acute on chronic systolic (congestive) heart failure: Secondary | ICD-10-CM | POA: Diagnosis not present

## 2018-01-17 DIAGNOSIS — I209 Angina pectoris, unspecified: Secondary | ICD-10-CM | POA: Diagnosis not present

## 2018-01-17 LAB — COMPREHENSIVE METABOLIC PANEL
ALBUMIN: 4 g/dL (ref 3.5–4.7)
ALT: 26 IU/L (ref 0–32)
AST: 31 IU/L (ref 0–40)
Albumin/Globulin Ratio: 1.3 (ref 1.2–2.2)
Alkaline Phosphatase: 231 IU/L — ABNORMAL HIGH (ref 39–117)
BILIRUBIN TOTAL: 0.5 mg/dL (ref 0.0–1.2)
BUN / CREAT RATIO: 17 (ref 12–28)
BUN: 14 mg/dL (ref 8–27)
CALCIUM: 9.3 mg/dL (ref 8.7–10.3)
CHLORIDE: 99 mmol/L (ref 96–106)
CO2: 22 mmol/L (ref 20–29)
Creatinine, Ser: 0.82 mg/dL (ref 0.57–1.00)
GFR calc non Af Amer: 65 mL/min/{1.73_m2} (ref >59)
GFR, EST AFRICAN AMERICAN: 75 mL/min/{1.73_m2} (ref >59)
GLUCOSE: 126 mg/dL — AB (ref 65–99)
Globulin, Total: 3 g/dL (ref 1.5–4.5)
Potassium: 4.8 mmol/L (ref 3.5–5.2)
Sodium: 137 mmol/L (ref 134–144)
TOTAL PROTEIN: 7 g/dL (ref 6.0–8.5)

## 2018-01-17 LAB — CBC WITH DIFFERENTIAL/PLATELET
BASOS ABS: 0.1 10*3/uL (ref 0.0–0.2)
Basos: 1 %
EOS (ABSOLUTE): 0.4 10*3/uL (ref 0.0–0.4)
Eos: 4 %
HEMOGLOBIN: 13.1 g/dL (ref 11.1–15.9)
Hematocrit: 39.3 % (ref 34.0–46.6)
IMMATURE GRANS (ABS): 0 10*3/uL (ref 0.0–0.1)
IMMATURE GRANULOCYTES: 0 %
LYMPHS: 34 %
Lymphocytes Absolute: 3.4 10*3/uL — ABNORMAL HIGH (ref 0.7–3.1)
MCH: 27.8 pg (ref 26.6–33.0)
MCHC: 33.3 g/dL (ref 31.5–35.7)
MCV: 83 fL (ref 79–97)
MONOCYTES: 10 %
Monocytes Absolute: 1 10*3/uL — ABNORMAL HIGH (ref 0.1–0.9)
NEUTROS ABS: 5.1 10*3/uL (ref 1.4–7.0)
NEUTROS PCT: 51 %
PLATELETS: 239 10*3/uL (ref 150–450)
RBC: 4.71 x10E6/uL (ref 3.77–5.28)
RDW: 14.7 % (ref 12.3–15.4)
WBC: 9.9 10*3/uL (ref 3.4–10.8)

## 2018-01-17 LAB — PRO B NATRIURETIC PEPTIDE: NT-Pro BNP: 1194 pg/mL — ABNORMAL HIGH (ref 0–738)

## 2018-01-17 MED ORDER — FUROSEMIDE 40 MG PO TABS: 40.0000 mg | ORAL_TABLET | Freq: Every day | ORAL | 3 refills | Status: DC

## 2018-01-17 MED ORDER — POTASSIUM CHLORIDE ER 10 MEQ PO TBCR: 10.0000 meq | EXTENDED_RELEASE_TABLET | Freq: Every day | ORAL | 3 refills | Status: DC

## 2018-01-17 NOTE — H&P (View-Only) (Signed)
Cardiology Office Note:    Date:  01/18/2018   ID:  [name], DOB 08-Jul-1929, MRN [id number]  PCP:  Jinny Sanders, MD  Cardiologist:  No primary care provider on file.  Electrophysiologist:  None   Referring MD: Jinny Sanders, MD   Chief Complaint  Patient presents with  . Shortness of Breath    History of Present Illness:    [name] is a 84 y.o. female with a hx of CHF, PAD, diabetes, and aortic stenosis, presenting for follow-up evaluation.   I haven't seen the patient in several years. She was hospitalized in December 2018 after a mechanical fall and right hip fracture. During the hospitalization, she suffered a NSTEMI and was also noted to have severe LV dysfunction and heart failure. Conservative management was recommended in the setting of anemia, advanced age, and hip fracture.   The patient is here alone today. She is having problems with persistent shortness of breath and chest discomfort with activity. She says several times that she 'just can't catch her breath' much of the time. She also complains of central chest pressure with ambulation or physical activities. She denies resting chest pain. She complains of orthopnea but denies PND. She complains of marked fatigue.   Past Medical History:  Diagnosis Date  . Anxiety   . Diaphragmatic hernia without mention of obstruction or gangrene   . Diverticulosis of colon (without mention of hemorrhage) 2009  . Fibromyalgia   . GERD (gastroesophageal reflux disease)   . Hiatal hernia 2009   s/p nissen fundoplication 0109.    Marland Kitchen Hx of adenomatous colonic polyps 2007, 2009   Adenomatous polyps in 2007 and 2009.  Hyperplastic polyps in 2009  . Hyperlipidemia   . IBS (irritable bowel syndrome)   . Internal hemorrhoids without mention of complication 3235   Seen at colonoscopy.  . Thyroid disease   . Type II or unspecified type diabetes mellitus with neurological manifestations, not stated as uncontrolled(250.60)       Past Surgical History:  Procedure Laterality Date  . ABDOMINAL HYSTERECTOMY    . BASAL CELL CARCINOMA EXCISION  04/2016   corner of left eye  . bmd  2007  . CATARACT EXTRACTION    . ESOPHAGOGASTRODUODENOSCOPY  2007  . HERNIA REPAIR    . INTRAMEDULLARY (IM) NAIL INTERTROCHANTERIC Right 03/01/2017   Procedure: INTRAMEDULLARY (IM) NAIL INTERTROCHANTRIC;  Surgeon: Hiram Gash, MD;  Location: [facility];  Service: Orthopedics;  Laterality: Right;  . LAPAROSCOPIC NISSEN FUNDOPLICATION  5732  . ORIF WRIST FRACTURE Right 03/01/2017   Procedure: OPEN REDUCTION INTERNAL FIXATION (ORIF) WRIST FRACTURE;  Surgeon: Hiram Gash, MD;  Location: [facility];  Service: Orthopedics;  Laterality: Right;  . THYROIDECTOMY, PARTIAL      Current Medications: Current Meds  Medication Sig  . acetaminophen (TYLENOL) 500 MG tablet Take 500 mg by mouth every 6 (six) hours as needed.  Marland Kitchen aspirin 81 MG tablet Take 81 mg by mouth daily.    Marland Kitchen atorvastatin (LIPITOR) 40 MG tablet Take 1 tablet (40 mg total) by mouth daily at 6 PM.  . calcium carbonate (OS-CAL) 1250 (500 Ca) MG chewable tablet Chew 1 tablet by mouth daily.  . carvedilol (COREG) 6.25 MG tablet TAKE 1 TABLET BY MOUTH TWICE DAILY WITH MEALS  . furosemide (LASIX) 40 MG tablet Take 1 tablet (40 mg total) by mouth daily.  . insulin NPH Human (HUMULIN N,NOVOLIN N) 100 UNIT/ML injection Inject 15 Units into the skin  at bedtime.   Marland Kitchen losartan (COZAAR) 25 MG tablet TAKE 1 TABLET BY MOUTH ONCE DAILY  . NOVOLOG FLEXPEN 100 UNIT/ML FlexPen INJECT 10 UNITS SUBCUTANEOUSLY THREE TIMES DAILY WITH MEALS  . omeprazole (PRILOSEC) 20 MG capsule Take 20 mg by mouth 2 (two) times daily before a meal.  . [DISCONTINUED] furosemide (LASIX) 20 MG tablet TAKE 1 TABLET BY MOUTH ONCE DAILY     Allergies:   Codeine; Metformin; Penicillins; Pioglitazone; and Fenofibrate   Social History   Socioeconomic History  . Marital status: Widowed    Spouse name: Not on file  . Number of  children: 3  . Years of education: Not on file  . Highest education level: Not on file  Occupational History  . Occupation: retired    Fish farm manager: RETIRED  Social Needs  . Financial resource strain: Not on file  . Food insecurity:    Worry: Not on file    Inability: Not on file  . Transportation needs:    Medical: Not on file    Non-medical: Not on file  Tobacco Use  . Smoking status: Former Smoker    Packs/day: 0.25    Years: 4.00    Pack years: 1.00    Types: Cigarettes    Last attempt to quit: 03/10/1981    Years since quitting: 36.8  . Smokeless tobacco: Never Used  Substance and Sexual Activity  . Alcohol use: No    Alcohol/week: 0.0 standard drinks  . Drug use: No  . Sexual activity: Never  Lifestyle  . Physical activity:    Days per week: Not on file    Minutes per session: Not on file  . Stress: Not on file  Relationships  . Social connections:    Talks on phone: Not on file    Gets together: Not on file    Attends religious service: Not on file    Active member of club or organization: Not on file    Attends meetings of clubs or organizations: Not on file    Relationship status: Not on file  Other Topics Concern  . Not on file  Social History Narrative   Widowed   3 children           Family History: The patient's family history includes Breast cancer in her paternal aunt; Cancer in her mother; Diabetes in her maternal grandfather and son; Heart disease in her paternal grandmother; Hyperlipidemia in her son; Uterine cancer in her mother. There is no history of Colon cancer or Stomach cancer.  ROS:   Please see the history of present illness.    Hearing loss, fatigue, leg pain. All other systems reviewed and are negative.  EKGs/Labs/Other Studies Reviewed:    The following studies were reviewed today: Echo 03-06-2017: Study Conclusions  - Left ventricle: The cavity size was normal. There was mild focal   basal hypertrophy of the septum. Systolic  function was severely   reduced. The estimated ejection fraction was in the range of 20%   to 25%. Severe hypokinesis of the mid-apicalanteroseptal,   inferior, and inferoseptal myocardium. Doppler parameters are   consistent with abnormal left ventricular relaxation (grade 1   diastolic dysfunction). - Aortic valve: Valve mobility was restricted. There was moderate   stenosis. There was moderate regurgitation. Regurgitation   pressure half-time: 299 ms. - Mitral valve: Thickening. Calcification. Transvalvular velocity   was within the normal range. There was no evidence for stenosis.   There was moderate regurgitation. - Right ventricle:  The cavity size was normal. Wall thickness was   normal. Systolic function was normal. - Tricuspid valve: There was mild regurgitation. - Pulmonary arteries: Systolic pressure was moderately increased.   PA peak pressure: 50 mm Hg (S).  Impressions:  - Compared with the echo 09/5782, systolic function is significantly   reduced.  EKG:  EKG is ordered today.  The ekg ordered today demonstrates sinus rhythm, possible age-indeterminate inferior infarct, nonspecific ST-T abnormality  Recent Labs: 03/05/2017: B Natriuretic Peptide 3,581.4; TSH 0.294 03/08/2017: Magnesium 1.9 01/17/2018: ALT 26; BUN 14; Creatinine, Ser 0.82; Hemoglobin 13.1; NT-Pro BNP 1,194; Platelets 239; Potassium 4.8; Sodium 137  Recent Lipid Panel    Component Value Date/Time   CHOL 248 (H) 06/23/2016 1157   TRIG (H) 06/23/2016 1157    406.0 Triglyceride is over 400; calculations on Lipids are invalid.   HDL 38.40 (L) 06/23/2016 1157   CHOLHDL 6 06/23/2016 1157   VLDL 76.6 (H) 08/17/2015 1006   LDLCALC 79 04/13/2007 0800   LDLDIRECT 101.0 06/23/2016 1157    Physical Exam:    VS:  BP 140/68   Pulse 64   Ht 5\' 3"  (1.6 m)   Wt 145 lb (65.8 kg)   BMI 25.69 kg/m     Wt Readings from Last 3 Encounters:  01/18/18 144 lb 6.4 oz (65.5 kg)  01/17/18 145 lb (65.8 kg)    01/17/18 145 lb 12.8 oz (66.1 kg)     GEN:  Elderly woman in no acute distress HEENT: Normal NECK: positive JVD; No carotid bruits LYMPHATICS: No lymphadenopathy CARDIAC: RRR, 3/6 mid peaking harsh systolic murmur at the RUSB RESPIRATORY:  Clear to auscultation without rales, wheezing or rhonchi  ABDOMEN: Soft, non-tender, non-distended MUSCULOSKELETAL:  No edema; No deformity  SKIN: Warm and dry NEUROLOGIC:  Alert and oriented x 3 PSYCHIATRIC:  Normal affect   ASSESSMENT:    1. Acute on chronic systolic heart failure ([place])   2. Angina pectoris ([place])   3. Nonrheumatic aortic valve stenosis    PLAN:    In order of problems listed above:  1. Symptoms and exam consistent with acute on chronic systolic heart failure. Complicating features include advanced age, presence of angina, and aortic stenosis. Will increase furosemide to 40 mg daily. Continue carvedilol and losartan at current doses. Update echocardiogram to reassess LV function and degree of aortic stenosis.  2. I've recommended R/L heart catheterization to better assess aortic stenosis, hemodynamics, and the presence of obstructive CAD in the setting of CCS class III angina.  3. Check echo  I have reviewed the risks, indications, and alternatives to cardiac catheterization, possible angioplasty, and stenting with the patient. Risks include but are not limited to bleeding, infection, vascular injury, stroke, myocardial infection, arrhythmia, kidney injury, radiation-related injury in the case of prolonged fluoroscopy use, emergency cardiac surgery, and death. The patient understands the risks of serious complication is 1-2 in 6962 with diagnostic cardiac cath and 1-2% or less with angioplasty/stenting. Will obtain labs and echo results before proceeding with cardiac catheterization.   Medication Adjustments/Labs and Tests Ordered: Current medicines are reviewed at length with the patient today.  Concerns regarding medicines are  outlined above.  Orders Placed This Encounter  Procedures  . CBC with Differential/Platelet  . Comprehensive metabolic panel  . Pro b natriuretic peptide (BNP)  . ECHOCARDIOGRAM COMPLETE   Meds ordered this encounter  Medications  . potassium chloride (K-DUR) 10 MEQ tablet    Sig: Take 1 tablet (10 mEq total) by  mouth daily.    Dispense:  90 tablet    Refill:  3  . furosemide (LASIX) 40 MG tablet    Sig: Take 1 tablet (40 mg total) by mouth daily.    Dispense:  90 tablet    Refill:  3    Patient Instructions  Medication Instructions:  1) INCREASE LASIX to 40 mg daily 2) START KDUR (potassium supplement) 10 meq daily  Labwork: TODAY: CMET, CBC, BNP  Testing/Procedures: Your provider has requested that you have an echocardiogram. Echocardiography is a painless test that uses sound waves to create images of your heart. It provides your doctor with information about the size and shape of your heart and how well your heart's chambers and valves are working. This procedure takes approximately one hour. There are no restrictions for this procedure. Your ultrasound is scheduled 01/25/2018. Please arrive at 11:00AM for this appointment.    Your physician has requested that you have a cardiac catheterization. Cardiac catheterization is used to diagnose and/or treat various heart conditions. Doctors may recommend this procedure for a number of different reasons. The most common reason is to evaluate chest pain. Chest pain can be a symptom of coronary artery disease (CAD), and cardiac catheterization can show whether plaque is narrowing or blocking your heart's arteries. This procedure is also used to evaluate the valves, as well as measure the blood flow and oxygen levels in different parts of your heart. For further information please visit [email or web address]. Please follow instruction sheet, as given.  Follow-Up: Your provider recommends that you schedule a follow-up appointment AS NEEDED  pending study results.  Any Other Special Instructions Will Be Listed Below (If Applicable).     [facility] OFFICE [place], [place] [address] Dept: [phone] Loc: [phone]  [name]  01/17/2018  Cath instructions are below. You will be called with date and time. Options: October 30, November 8. 1. Please arrive at the [facility] (Main Entrance A) at [facility]: [address] [place], [place] [address]. Free valet parking service is available.   Special note: Every effort is made to have your procedure done on time. Please understand that emergencies sometimes delay scheduled procedures.  2. Diet: Do not eat solid foods after midnight.  The patient may have clear liquids until 5am upon the day of the procedure.  3. Labs: TODAY!  4. Medication instructions in preparation for your procedure:   1) Take only 7 units of insulin the night before your procedure. Do not take any the day of your cath.   2) MAKE SURE TO TAKE ASPIRIN 81 mg the morning of your procedure  3) HOLD LASIX the morning of your cath  4) you may take your other meds (except above) as directed with sips of water  5. Plan for one night stay--bring personal belongings. 6. Bring a current list of your medications and current insurance cards. 7. You MUST have a responsible person to drive you home. 8. Someone MUST be with you the first 24 hours after you arrive home or your discharge will be delayed. 9. Please wear clothes that are easy to get on and off and wear slip-on shoes.  Thank you for allowing [place] to care for you!   -- [facility] services     Signed, Sherren Mocha, MD  01/18/2018 11:25 PM    [place]

## 2018-01-17 NOTE — Progress Notes (Signed)
Cardiology Office Note:    Date:  01/18/2018   ID:  [name], DOB 1929-10-25, MRN [id number]  PCP:  Jinny Sanders, MD  Cardiologist:  No primary care provider on file.  Electrophysiologist:  None   Referring MD: Jinny Sanders, MD   Chief Complaint  Patient presents with  . Shortness of Breath    History of Present Illness:    [name] is a 84 y.o. female with a hx of CHF, PAD, diabetes, and aortic stenosis, presenting for follow-up evaluation.   I haven't seen the patient in several years. She was hospitalized in December 2018 after a mechanical fall and right hip fracture. During the hospitalization, she suffered a NSTEMI and was also noted to have severe LV dysfunction and heart failure. Conservative management was recommended in the setting of anemia, advanced age, and hip fracture.   The patient is here alone today. She is having problems with persistent shortness of breath and chest discomfort with activity. She says several times that she 'just can't catch her breath' much of the time. She also complains of central chest pressure with ambulation or physical activities. She denies resting chest pain. She complains of orthopnea but denies PND. She complains of marked fatigue.   Past Medical History:  Diagnosis Date  . Anxiety   . Diaphragmatic hernia without mention of obstruction or gangrene   . Diverticulosis of colon (without mention of hemorrhage) 2009  . Fibromyalgia   . GERD (gastroesophageal reflux disease)   . Hiatal hernia 2009   s/p nissen fundoplication 6384.    Marland Kitchen Hx of adenomatous colonic polyps 2007, 2009   Adenomatous polyps in 2007 and 2009.  Hyperplastic polyps in 2009  . Hyperlipidemia   . IBS (irritable bowel syndrome)   . Internal hemorrhoids without mention of complication 5364   Seen at colonoscopy.  . Thyroid disease   . Type II or unspecified type diabetes mellitus with neurological manifestations, not stated as uncontrolled(250.60)       Past Surgical History:  Procedure Laterality Date  . ABDOMINAL HYSTERECTOMY    . BASAL CELL CARCINOMA EXCISION  04/2016   corner of left eye  . bmd  2007  . CATARACT EXTRACTION    . ESOPHAGOGASTRODUODENOSCOPY  2007  . HERNIA REPAIR    . INTRAMEDULLARY (IM) NAIL INTERTROCHANTERIC Right 03/01/2017   Procedure: INTRAMEDULLARY (IM) NAIL INTERTROCHANTRIC;  Surgeon: Hiram Gash, MD;  Location: [facility];  Service: Orthopedics;  Laterality: Right;  . LAPAROSCOPIC NISSEN FUNDOPLICATION  6803  . ORIF WRIST FRACTURE Right 03/01/2017   Procedure: OPEN REDUCTION INTERNAL FIXATION (ORIF) WRIST FRACTURE;  Surgeon: Hiram Gash, MD;  Location: [facility];  Service: Orthopedics;  Laterality: Right;  . THYROIDECTOMY, PARTIAL      Current Medications: Current Meds  Medication Sig  . acetaminophen (TYLENOL) 500 MG tablet Take 500 mg by mouth every 6 (six) hours as needed.  Marland Kitchen aspirin 81 MG tablet Take 81 mg by mouth daily.    Marland Kitchen atorvastatin (LIPITOR) 40 MG tablet Take 1 tablet (40 mg total) by mouth daily at 6 PM.  . calcium carbonate (OS-CAL) 1250 (500 Ca) MG chewable tablet Chew 1 tablet by mouth daily.  . carvedilol (COREG) 6.25 MG tablet TAKE 1 TABLET BY MOUTH TWICE DAILY WITH MEALS  . furosemide (LASIX) 40 MG tablet Take 1 tablet (40 mg total) by mouth daily.  . insulin NPH Human (HUMULIN N,NOVOLIN N) 100 UNIT/ML injection Inject 15 Units into the skin  at bedtime.   Marland Kitchen losartan (COZAAR) 25 MG tablet TAKE 1 TABLET BY MOUTH ONCE DAILY  . NOVOLOG FLEXPEN 100 UNIT/ML FlexPen INJECT 10 UNITS SUBCUTANEOUSLY THREE TIMES DAILY WITH MEALS  . omeprazole (PRILOSEC) 20 MG capsule Take 20 mg by mouth 2 (two) times daily before a meal.  . [DISCONTINUED] furosemide (LASIX) 20 MG tablet TAKE 1 TABLET BY MOUTH ONCE DAILY     Allergies:   Codeine; Metformin; Penicillins; Pioglitazone; and Fenofibrate   Social History   Socioeconomic History  . Marital status: Widowed    Spouse name: Not on file  . Number of  children: 3  . Years of education: Not on file  . Highest education level: Not on file  Occupational History  . Occupation: retired    Fish farm manager: RETIRED  Social Needs  . Financial resource strain: Not on file  . Food insecurity:    Worry: Not on file    Inability: Not on file  . Transportation needs:    Medical: Not on file    Non-medical: Not on file  Tobacco Use  . Smoking status: Former Smoker    Packs/day: 0.25    Years: 4.00    Pack years: 1.00    Types: Cigarettes    Last attempt to quit: 03/10/1981    Years since quitting: 36.8  . Smokeless tobacco: Never Used  Substance and Sexual Activity  . Alcohol use: No    Alcohol/week: 0.0 standard drinks  . Drug use: No  . Sexual activity: Never  Lifestyle  . Physical activity:    Days per week: Not on file    Minutes per session: Not on file  . Stress: Not on file  Relationships  . Social connections:    Talks on phone: Not on file    Gets together: Not on file    Attends religious service: Not on file    Active member of club or organization: Not on file    Attends meetings of clubs or organizations: Not on file    Relationship status: Not on file  Other Topics Concern  . Not on file  Social History Narrative   Widowed   3 children           Family History: The patient's family history includes Breast cancer in her paternal aunt; Cancer in her mother; Diabetes in her maternal grandfather and son; Heart disease in her paternal grandmother; Hyperlipidemia in her son; Uterine cancer in her mother. There is no history of Colon cancer or Stomach cancer.  ROS:   Please see the history of present illness.    Hearing loss, fatigue, leg pain. All other systems reviewed and are negative.  EKGs/Labs/Other Studies Reviewed:    The following studies were reviewed today: Echo 03-06-2017: Study Conclusions  - Left ventricle: The cavity size was normal. There was mild focal   basal hypertrophy of the septum. Systolic  function was severely   reduced. The estimated ejection fraction was in the range of 20%   to 25%. Severe hypokinesis of the mid-apicalanteroseptal,   inferior, and inferoseptal myocardium. Doppler parameters are   consistent with abnormal left ventricular relaxation (grade 1   diastolic dysfunction). - Aortic valve: Valve mobility was restricted. There was moderate   stenosis. There was moderate regurgitation. Regurgitation   pressure half-time: 299 ms. - Mitral valve: Thickening. Calcification. Transvalvular velocity   was within the normal range. There was no evidence for stenosis.   There was moderate regurgitation. - Right ventricle:  The cavity size was normal. Wall thickness was   normal. Systolic function was normal. - Tricuspid valve: There was mild regurgitation. - Pulmonary arteries: Systolic pressure was moderately increased.   PA peak pressure: 50 mm Hg (S).  Impressions:  - Compared with the echo 04/4780, systolic function is significantly   reduced.  EKG:  EKG is ordered today.  The ekg ordered today demonstrates sinus rhythm, possible age-indeterminate inferior infarct, nonspecific ST-T abnormality  Recent Labs: 03/05/2017: B Natriuretic Peptide 3,581.4; TSH 0.294 03/08/2017: Magnesium 1.9 01/17/2018: ALT 26; BUN 14; Creatinine, Ser 0.82; Hemoglobin 13.1; NT-Pro BNP 1,194; Platelets 239; Potassium 4.8; Sodium 137  Recent Lipid Panel    Component Value Date/Time   CHOL 248 (H) 06/23/2016 1157   TRIG (H) 06/23/2016 1157    406.0 Triglyceride is over 400; calculations on Lipids are invalid.   HDL 38.40 (L) 06/23/2016 1157   CHOLHDL 6 06/23/2016 1157   VLDL 76.6 (H) 08/17/2015 1006   LDLCALC 79 04/13/2007 0800   LDLDIRECT 101.0 06/23/2016 1157    Physical Exam:    VS:  BP 140/68   Pulse 64   Ht 5\' 3"  (1.6 m)   Wt 145 lb (65.8 kg)   BMI 25.69 kg/m     Wt Readings from Last 3 Encounters:  01/18/18 144 lb 6.4 oz (65.5 kg)  01/17/18 145 lb (65.8 kg)    01/17/18 145 lb 12.8 oz (66.1 kg)     GEN:  Elderly woman in no acute distress HEENT: Normal NECK: positive JVD; No carotid bruits LYMPHATICS: No lymphadenopathy CARDIAC: RRR, 3/6 mid peaking harsh systolic murmur at the RUSB RESPIRATORY:  Clear to auscultation without rales, wheezing or rhonchi  ABDOMEN: Soft, non-tender, non-distended MUSCULOSKELETAL:  No edema; No deformity  SKIN: Warm and dry NEUROLOGIC:  Alert and oriented x 3 PSYCHIATRIC:  Normal affect   ASSESSMENT:    1. Acute on chronic systolic heart failure ([place])   2. Angina pectoris ([place])   3. Nonrheumatic aortic valve stenosis    PLAN:    In order of problems listed above:  1. Symptoms and exam consistent with acute on chronic systolic heart failure. Complicating features include advanced age, presence of angina, and aortic stenosis. Will increase furosemide to 40 mg daily. Continue carvedilol and losartan at current doses. Update echocardiogram to reassess LV function and degree of aortic stenosis.  2. I've recommended R/L heart catheterization to better assess aortic stenosis, hemodynamics, and the presence of obstructive CAD in the setting of CCS class III angina.  3. Check echo  I have reviewed the risks, indications, and alternatives to cardiac catheterization, possible angioplasty, and stenting with the patient. Risks include but are not limited to bleeding, infection, vascular injury, stroke, myocardial infection, arrhythmia, kidney injury, radiation-related injury in the case of prolonged fluoroscopy use, emergency cardiac surgery, and death. The patient understands the risks of serious complication is 1-2 in 9562 with diagnostic cardiac cath and 1-2% or less with angioplasty/stenting. Will obtain labs and echo results before proceeding with cardiac catheterization.   Medication Adjustments/Labs and Tests Ordered: Current medicines are reviewed at length with the patient today.  Concerns regarding medicines are  outlined above.  Orders Placed This Encounter  Procedures  . CBC with Differential/Platelet  . Comprehensive metabolic panel  . Pro b natriuretic peptide (BNP)  . ECHOCARDIOGRAM COMPLETE   Meds ordered this encounter  Medications  . potassium chloride (K-DUR) 10 MEQ tablet    Sig: Take 1 tablet (10 mEq total) by  mouth daily.    Dispense:  90 tablet    Refill:  3  . furosemide (LASIX) 40 MG tablet    Sig: Take 1 tablet (40 mg total) by mouth daily.    Dispense:  90 tablet    Refill:  3    Patient Instructions  Medication Instructions:  1) INCREASE LASIX to 40 mg daily 2) START KDUR (potassium supplement) 10 meq daily  Labwork: TODAY: CMET, CBC, BNP  Testing/Procedures: Your provider has requested that you have an echocardiogram. Echocardiography is a painless test that uses sound waves to create images of your heart. It provides your doctor with information about the size and shape of your heart and how well your heart's chambers and valves are working. This procedure takes approximately one hour. There are no restrictions for this procedure. Your ultrasound is scheduled 01/25/2018. Please arrive at 11:00AM for this appointment.    Your physician has requested that you have a cardiac catheterization. Cardiac catheterization is used to diagnose and/or treat various heart conditions. Doctors may recommend this procedure for a number of different reasons. The most common reason is to evaluate chest pain. Chest pain can be a symptom of coronary artery disease (CAD), and cardiac catheterization can show whether plaque is narrowing or blocking your heart's arteries. This procedure is also used to evaluate the valves, as well as measure the blood flow and oxygen levels in different parts of your heart. For further information please visit HugeFiesta.[email or web address]. Please follow instruction sheet, as given.  Follow-Up: Your provider recommends that you schedule a follow-up appointment AS NEEDED  pending study results.  Any Other Special Instructions Will Be Listed Below (If Applicable).     [facility] OFFICE [place], [place] [address] Dept: [phone] Loc: [phone]  [name]  01/17/2018  Cath instructions are below. You will be called with date and time. Options: October 30, November 8. 1. Please arrive at the [facility] (Main Entrance A) at [facility]: [address] [place], [place] [address]. Free valet parking service is available.   Special note: Every effort is made to have your procedure done on time. Please understand that emergencies sometimes delay scheduled procedures.  2. Diet: Do not eat solid foods after midnight.  The patient may have clear liquids until 5am upon the day of the procedure.  3. Labs: TODAY!  4. Medication instructions in preparation for your procedure:   1) Take only 7 units of insulin the night before your procedure. Do not take any the day of your cath.   2) MAKE SURE TO TAKE ASPIRIN 81 mg the morning of your procedure  3) HOLD LASIX the morning of your cath  4) you may take your other meds (except above) as directed with sips of water  5. Plan for one night stay--bring personal belongings. 6. Bring a current list of your medications and current insurance cards. 7. You MUST have a responsible person to drive you home. 8. Someone MUST be with you the first 24 hours after you arrive home or your discharge will be delayed. 9. Please wear clothes that are easy to get on and off and wear slip-on shoes.  Thank you for allowing [place] to care for you!   -- [facility] services     Signed, Sherren Mocha, MD  01/18/2018 11:25 PM    [place]

## 2018-01-17 NOTE — Patient Instructions (Addendum)
Medication Instructions:  1) INCREASE LASIX to 40 mg daily 2) START KDUR (potassium supplement) 10 meq daily  Labwork: TODAY: CMET, CBC, BNP  Testing/Procedures: Your provider has requested that you have an echocardiogram. Echocardiography is a painless test that uses sound waves to create images of your heart. It provides your doctor with information about the size and shape of your heart and how well your heart's chambers and valves are working. This procedure takes approximately one hour. There are no restrictions for this procedure. Your ultrasound is scheduled 01/25/2018. Please arrive at 11:00AM for this appointment.    Your physician has requested that you have a cardiac catheterization. Cardiac catheterization is used to diagnose and/or treat various heart conditions. Doctors may recommend this procedure for a number of different reasons. The most common reason is to evaluate chest pain. Chest pain can be a symptom of coronary artery disease (CAD), and cardiac catheterization can show whether plaque is narrowing or blocking your heart's arteries. This procedure is also used to evaluate the valves, as well as measure the blood flow and oxygen levels in different parts of your heart. For further information please visit HugeFiesta[employer][email or web address]. Please follow instruction sheet, as given.  Follow-Up: Your provider recommends that you schedule a follow-up appointment AS NEEDED pending study results.  Any Other Special Instructions Will Be Listed Below (If Applicable).     [facility] OFFICE [place], [place] [address] Dept: [phone] Loc: [phone]  [name]  01/17/2018  Cath instructions are below. You will be called with date and time. Options: October 30, November 8. 1. Please arrive at the [facility] (Main Entrance A) at [facility]: [address] [place],   [address]. Free valet parking service is available.   Special note: Every effort is made to have your procedure done on time. Please understand that emergencies sometimes delay scheduled procedures.  2. Diet: Do not eat solid foods after midnight.  The patient may have clear liquids until 5am upon the day of the procedure.  3. Labs: TODAY!  4. Medication instructions in preparation for your procedure:   1) Take only 7 units of insulin the night before your procedure. Do not take any the day of your cath.   2) MAKE SURE TO TAKE ASPIRIN 81 mg the morning of your procedure  3) HOLD LASIX the morning of your cath  4) you may take your other meds (except above) as directed with sips of water  5. Plan for one night stay--bring personal belongings. 6. Bring a current list of your medications and current insurance cards. 7. You MUST have a responsible person to drive you home. 8. Someone MUST be with you the first 24 hours after you arrive home or your discharge will be delayed. 9. Please wear clothes that are easy to get on and off and wear slip-on shoes.  Thank you for allowing [place] to care for you!   -- [place] [facility]

## 2018-01-18 ENCOUNTER — Other Ambulatory Visit: Payer: Self-pay

## 2018-01-18 ENCOUNTER — Encounter: Payer: Self-pay | Admitting: Cardiovascular Disease

## 2018-01-18 ENCOUNTER — Encounter: Payer: Self-pay | Admitting: Family

## 2018-01-18 ENCOUNTER — Ambulatory Visit: Payer: PPO | Admitting: Family

## 2018-01-18 ENCOUNTER — Ambulatory Visit (HOSPITAL_COMMUNITY)
Admission: RE | Admit: 2018-01-18 | Discharge: 2018-01-18 | Disposition: A | Discharge status: Home / Self Care | Payer: PPO | Source: Ambulatory Visit | Attending: Vascular Surgery | Admitting: Vascular Surgery

## 2018-01-18 VITALS — BP 162/84 | HR 67 | Temp 98.5°F | Resp 18 | Ht 63.0 in | Wt 144.4 lb

## 2018-01-18 DIAGNOSIS — I779 Disorder of arteries and arterioles, unspecified: Secondary | ICD-10-CM

## 2018-01-18 DIAGNOSIS — E1142 Type 2 diabetes mellitus with diabetic polyneuropathy: Secondary | ICD-10-CM | POA: Diagnosis not present

## 2018-01-18 DIAGNOSIS — I739 Peripheral vascular disease, unspecified: Secondary | ICD-10-CM | POA: Insufficient documentation

## 2018-01-18 NOTE — Progress Notes (Signed)
[facility] [place]   CC: Follow up peripheral artery occlusive disease  History of Present Illness [name] is a 84 y.o. female whom Dr. Oneida Alar has monitored since 2012 intermittently for peripheral arterial disease.   Her previous symptoms were mainly mild claudication.  She underwent repair of a right hip fracture  In November 2018 and developed bilateral heel decubitus and a sacral decubitus ulcer.  She was followed by Dr. Joaquim Lai at the wound center.  The right heel ulcer took several months to heal but has completely epithelialized.   She denies rest pain in her feet.  She has chronic numbness and tingling and neuropathy which has been present for several years.  She denies claudication symptoms.  She is minimally ambulatory with a walker.  Other medical problems include anxiety, hyperlipidemia, diabetes all of which are currently stable.   She has significant cardiac dysfunction states that her most recent ejection fraction was in the 20% range.  She reports having an MI 2 days after the right hip surgery.  Dr. Oneida Alar last evaluated pt on 07-25-17. At that time bilateral heel wounds had healed spontaneously.  Her ABIs put her at risk for nonhealing wounds and limb threatening ischemia long-term.  However, in discussions with the patient she was not interested in any further procedures as she has had a very prolonged recovery from her hip fracture and she was concerned about her underlying cardiac dysfunction. Dr. Oneida Alar indicated that as long as she does not have an open wound, this is probably reasonable since she otherwise is asymptomatic. Dr. Oneida Alar discussed with her protecting her feet and checking her feet daily and calling us if she develops any new wounds. Pt was to follow-up in 6 months with repeat ABIs and see our nurse practitioner. If she developed a new wound we would certainly consider an arteriogram at that point however the patient is a little bit  reluctant to proceed with any procedures at that point.  She saw Dr. Burt Knack yesterday, had an echocardiogram; pt states she needs to have a cardiac cath.  She has a cardiac murmur, states she feels tired and is short of breath when she walks. She denies chest pain.  She has no rest pain in her feet other than they hurt at night, no wounds.   Pt denies any known history of stroke or TIA.     Diabetic: Yes, A1C was 6.1 on 01-05-18, in good control Tobacco use: non-smoker  Pt meds include: Statin :Yes Betablocker: Yes ASA: Yes Other anticoagulants/antiplatelets: no  Past Medical History:  Diagnosis Date  . Anxiety   . Diaphragmatic hernia without mention of obstruction or gangrene   . Diverticulosis of colon (without mention of hemorrhage) 2009  . Fibromyalgia   . GERD (gastroesophageal reflux disease)   . Hiatal hernia 2009   s/p nissen fundoplication 9622.    Marland Kitchen Hx of adenomatous colonic polyps 2007, 2009   Adenomatous polyps in 2007 and 2009.  Hyperplastic polyps in 2009  . Hyperlipidemia   . IBS (irritable bowel syndrome)   . Internal hemorrhoids without mention of complication 2979   Seen at colonoscopy.  . Thyroid disease   . Type II or unspecified type diabetes mellitus with neurological manifestations, not stated as uncontrolled(250.60)     Social History Social History   Tobacco Use  . Smoking status: Former Smoker    Packs/day: 0.25    Years: 4.00    Pack years: 1.00  Types: Cigarettes    Last attempt to quit: 03/10/1981    Years since quitting: 36.8  . Smokeless tobacco: Never Used  Substance Use Topics  . Alcohol use: No    Alcohol/week: 0.0 standard drinks  . Drug use: No    Family History Family History  Problem Relation Age of Onset  . Uterine cancer Mother   . Cancer Mother   . Diabetes Maternal Grandfather   . Diabetes Son   . Hyperlipidemia Son   . Heart disease Paternal Grandmother   . Breast cancer Paternal Aunt   . Colon cancer Neg  Hx   . Stomach cancer Neg Hx     Past Surgical History:  Procedure Laterality Date  . ABDOMINAL HYSTERECTOMY    . BASAL CELL CARCINOMA EXCISION  04/2016   corner of left eye  . bmd  2007  . CATARACT EXTRACTION    . ESOPHAGOGASTRODUODENOSCOPY  2007  . HERNIA REPAIR    . INTRAMEDULLARY (IM) NAIL INTERTROCHANTERIC Right 03/01/2017   Procedure: INTRAMEDULLARY (IM) NAIL INTERTROCHANTRIC;  Surgeon: Hiram Gash, MD;  Location: [facility];  Service: Orthopedics;  Laterality: Right;  . LAPAROSCOPIC NISSEN FUNDOPLICATION  3810  . ORIF WRIST FRACTURE Right 03/01/2017   Procedure: OPEN REDUCTION INTERNAL FIXATION (ORIF) WRIST FRACTURE;  Surgeon: Hiram Gash, MD;  Location: [facility];  Service: Orthopedics;  Laterality: Right;  . THYROIDECTOMY, PARTIAL      Allergies  Allergen Reactions  . Codeine     Rapid heart rate  . Metformin     unspecified  . Penicillins     unspecified  . Pioglitazone     REACTION: rash  . Fenofibrate Palpitations    Current Outpatient Medications  Medication Sig Dispense Refill  . acetaminophen (TYLENOL) 500 MG tablet Take 500 mg by mouth every 6 (six) hours as needed.    Marland Kitchen aspirin 81 MG tablet Take 81 mg by mouth daily.      Marland Kitchen atorvastatin (LIPITOR) 40 MG tablet Take 1 tablet (40 mg total) by mouth daily at 6 PM. 30 tablet 11  . calcium carbonate (OS-CAL) 1250 (500 Ca) MG chewable tablet Chew 1 tablet by mouth daily.    . carvedilol (COREG) 6.25 MG tablet TAKE 1 TABLET BY MOUTH TWICE DAILY WITH MEALS 180 tablet 0  . furosemide (LASIX) 40 MG tablet Take 1 tablet (40 mg total) by mouth daily. 90 tablet 3  . glucose blood (ONE TOUCH ULTRA TEST) test strip USE ONE STRIP TO CHECK GLUCOSE 4 TIMES A DAY 125 each 6  . insulin NPH Human (HUMULIN N,NOVOLIN N) 100 UNIT/ML injection Inject 15 Units into the skin at bedtime.     Marland Kitchen losartan (COZAAR) 25 MG tablet TAKE 1 TABLET BY MOUTH ONCE DAILY 90 tablet 0  . NOVOLOG FLEXPEN 100 UNIT/ML FlexPen INJECT 10 UNITS SUBCUTANEOUSLY  THREE TIMES DAILY WITH MEALS 15 mL 2  . omeprazole (PRILOSEC) 20 MG capsule Take 20 mg by mouth 2 (two) times daily before a meal.    . potassium chloride (K-DUR) 10 MEQ tablet Take 1 tablet (10 mEq total) by mouth daily. 90 tablet 3   No current facility-administered medications for this visit.     ROS: See HPI for pertinent positives and negatives.   Physical Examination  Vitals:   01/18/18 1139 01/18/18 1140  BP: (!) 158/79 (!) 162/84  Pulse: 67   Resp: 18   Temp: 98.5 F (36.9 C)   TempSrc: Oral   SpO2: 94%  Weight: 144 lb 6.4 oz (65.5 kg)   Height: 5\' 3"  (1.6 m)    Body mass index is 25.58 kg/m.  General: A&O x 3, WDWN, elderly female. Gait: slow, steady, using a walker HENT: No gross abnormalities.  Eyes: PERRLA. Pulmonary: Respirations are non labored, CTAB, good air movement in all fields Cardiac: regular rhythm, + murmur.         Carotid Bruits Right Left   Negative Negative   Radial pulses are 1+ palpable bilaterally   Adominal aortic pulse is not palpable                         VASCULAR EXAM: Extremities without ischemic changes, without Gangrene; without open wounds.                                                                                                          LE Pulses Right Left       FEMORAL  2+ palpable  1+ palpable        POPLITEAL  not palpable   not palpable       POSTERIOR TIBIAL  not palpable   not palpable        DORSALIS PEDIS      ANTERIOR TIBIAL not palpable  not palpable    Abdomen: soft, NT, no palpable masses. Skin: no rashes, no cellulitis, no ulcers. Musculoskeletal: no muscle wasting or atrophy.  Neurologic: A&O X 3; appropriate affect, Sensation is normal; MOTOR FUNCTION:  moving all extremities equally, motor strength 5/5 throughout. Speech is fluent/normal. CN 2-12 intact except has some hearing loss. Psychiatric: Thought content is normal, mood appropriate for clinical situation.     ASSESSMENT: [name] is a 84 y.o. female who developed heel ulcers after repair of right hip fracture in November 2018; she reports having an MI 2 days after the surgery. She sees Dr. Burt Knack, and states she needs a cardiac cath.  Her heel ulcers have healed, no new wounds or ulcers in her lower extremities. No signs of ischemia in her feet or legs.  She had home physical therapy and has been walking more with her walker, being very careful not to fall, and performing daily seated leg exercises taught to her by PT.  She does not seem to have claudication symptoms with walking, has no rest pain, does have feet cramping at night.   DATA  ABI (Date: 01/18/2018):  R:   ABI: 0.49 (was 0.42 on 07-13-17),   PT: absent  DP: mono  TBI:  0.27, toe pressure 41, (was 0.48)  L:   ABI: 0.51 (was 0.48),   PT: mono  DP: mono  TBI: 0.41, toe pressure 63, (was 0.00) Slightly improved bilateral ABI; severe disease in the right, moderate in the left. Right PT signal not obtained, otherwise bilateral waveforms are monophasic.  Left TBI improved from 0.00 to 0.41.     PLAN:  Pt demonstrated daily seated leg exercises as taught to her by physical therapy. I advised to to continue  this several times per day.  Based on the patient's vascular studies and examination, pt will return to clinic in 6 months with ABI's. I advised her to notify us if she develops concerns re the circulation in her feet or legs.   I discussed in depth with the patient the nature of atherosclerosis, and emphasized the importance of maximal medical management including strict control of blood pressure, blood glucose, and lipid levels, obtaining regular exercise, and continued cessation of smoking.  The patient is aware that without maximal medical management the underlying atherosclerotic disease process will progress, limiting the benefit of any interventions.  The patient was given information about PAD including signs, symptoms,  treatment, what symptoms should prompt the patient to seek immediate medical care, and risk reduction measures to take.  Clemon Chambers, RN, MSN, FNP-C [facility] [employer] Phone: [phone]  Clinic MD: [facility]  01/18/18 11:48 AM

## 2018-01-18 NOTE — Patient Instructions (Signed)

## 2018-01-18 NOTE — Progress Notes (Signed)
This encounter was created in error - please disregard.

## 2018-01-19 ENCOUNTER — Telehealth: Payer: Self-pay

## 2018-01-19 NOTE — Telephone Encounter (Signed)
Scheduled patient for [facility] 10/30 with Dr. Burt Knack (see 10/9 OV note). Dx: CAD, AS Arrival time 0930 for 1130 case. Instructions were given to patient at time of OV. She was grateful for call and agrees with treatment plan.

## 2018-01-25 ENCOUNTER — Other Ambulatory Visit: Payer: Self-pay

## 2018-01-25 ENCOUNTER — Ambulatory Visit (HOSPITAL_COMMUNITY): Payer: PPO | Attending: Cardiovascular Disease

## 2018-01-25 DIAGNOSIS — I5023 Acute on chronic systolic (congestive) heart failure: Secondary | ICD-10-CM | POA: Insufficient documentation

## 2018-01-26 ENCOUNTER — Telehealth: Payer: Self-pay

## 2018-01-26 ENCOUNTER — Other Ambulatory Visit: Payer: Self-pay | Admitting: Family Medicine

## 2018-01-26 DIAGNOSIS — E1165 Type 2 diabetes mellitus with hyperglycemia: Principal | ICD-10-CM

## 2018-01-26 DIAGNOSIS — E1122 Type 2 diabetes mellitus with diabetic chronic kidney disease: Secondary | ICD-10-CM

## 2018-01-26 DIAGNOSIS — IMO0002 Reserved for concepts with insufficient information to code with codable children: Secondary | ICD-10-CM

## 2018-01-26 DIAGNOSIS — I5023 Acute on chronic systolic (congestive) heart failure: Secondary | ICD-10-CM

## 2018-01-26 NOTE — Telephone Encounter (Signed)
-----   Message from Sherren Mocha, MD sent at 01/26/2018  5:55 AM EDT ----- LV function is now normal. This suggests a previous stress cardiomyopathy may be more likely than obstructive CAD. Aortic stenosis is moderate. Repeat echo in one year. Plans for R/L heart cath in near future to further evaluate CHF And chest pain.

## 2018-01-26 NOTE — Telephone Encounter (Signed)
Informed patient of results and verbal understanding expressed.  Echo ordered to be scheduled in 1 year. Confirmed cath date/time on October 30. She has no further questions. She was grateful for call and agrees with treatment plan.

## 2018-02-05 ENCOUNTER — Telehealth: Payer: Self-pay | Admitting: *Deleted

## 2018-02-05 NOTE — Telephone Encounter (Signed)
I'll review medicine instructions with the patient when she comes in for her heart catheterization. thanks

## 2018-02-05 NOTE — Telephone Encounter (Signed)
Pt contacted pre-catheterization scheduled at [facility] for: Wednesday February 07, 2018 11:30 AM Verified arrival time and place: [place] Entrance A at: 9 AM  No solid food after midnight prior to cath, clear liquids until 5 AM day of procedure. Contrast allergy: no  Hold: Insulin-AM of procedure. 1/2 Insulin PM prior to procedure. Furosemide -AM of procedure. KCl-AM of procedure.   Except hold medications AM meds can be  taken pre-cath with sip of water including: ASA 81 mg  Confirmed patient has responsible person to drive home post procedure and for 24 hours after you arrive home: yes

## 2018-02-05 NOTE — Telephone Encounter (Signed)
I spoke with patient today to review instructions for [facility] scheduled for 02/07/18. Pt had an office visit with Dr Burt Knack 01/17/18. Pt states she was told to stop taking losartan and carvedilol at the time of that office visit and has not taken them since then. Pt states she did not have problems tolerating these medications but thought she was supposed to stop them.  Pt advised I will forward to Dr Burt Knack for review and recommendations.

## 2018-02-06 NOTE — Telephone Encounter (Signed)
Pt advised Dr Burt Knack will review medication instructions when she comes for heart catheterization.

## 2018-02-07 ENCOUNTER — Encounter (HOSPITAL_COMMUNITY): Payer: Self-pay | Admitting: *Deleted

## 2018-02-07 ENCOUNTER — Other Ambulatory Visit: Payer: Self-pay

## 2018-02-07 ENCOUNTER — Encounter (HOSPITAL_COMMUNITY)
Admission: RE | Disposition: A | Discharge status: Home / Self Care | Payer: Self-pay | Source: Home / Self Care | Attending: Cardiovascular Disease

## 2018-02-07 ENCOUNTER — Inpatient Hospital Stay (HOSPITAL_COMMUNITY)
Admission: RE | Admit: 2018-02-07 | Discharge: 2018-02-15 | DRG: 215 | Disposition: A | Discharge status: Home / Self Care | Payer: PPO | Attending: Cardiovascular Disease | Admitting: Cardiovascular Disease

## 2018-02-07 DIAGNOSIS — I350 Nonrheumatic aortic (valve) stenosis: Secondary | ICD-10-CM

## 2018-02-07 DIAGNOSIS — Z885 Allergy status to narcotic agent status: Secondary | ICD-10-CM

## 2018-02-07 DIAGNOSIS — Z833 Family history of diabetes mellitus: Secondary | ICD-10-CM | POA: Diagnosis not present

## 2018-02-07 DIAGNOSIS — Z90710 Acquired absence of both cervix and uterus: Secondary | ICD-10-CM

## 2018-02-07 DIAGNOSIS — Z880 Allergy status to penicillin: Secondary | ICD-10-CM

## 2018-02-07 DIAGNOSIS — Z8049 Family history of malignant neoplasm of other genital organs: Secondary | ICD-10-CM

## 2018-02-07 DIAGNOSIS — Z888 Allergy status to other drugs, medicaments and biological substances status: Secondary | ICD-10-CM

## 2018-02-07 DIAGNOSIS — I25119 Atherosclerotic heart disease of native coronary artery with unspecified angina pectoris: Secondary | ICD-10-CM

## 2018-02-07 DIAGNOSIS — I5042 Chronic combined systolic (congestive) and diastolic (congestive) heart failure: Secondary | ICD-10-CM | POA: Diagnosis present

## 2018-02-07 DIAGNOSIS — K219 Gastro-esophageal reflux disease without esophagitis: Secondary | ICD-10-CM | POA: Diagnosis present

## 2018-02-07 DIAGNOSIS — E1149 Type 2 diabetes mellitus with other diabetic neurological complication: Secondary | ICD-10-CM | POA: Diagnosis not present

## 2018-02-07 DIAGNOSIS — I5043 Acute on chronic combined systolic (congestive) and diastolic (congestive) heart failure: Secondary | ICD-10-CM | POA: Diagnosis not present

## 2018-02-07 DIAGNOSIS — I25110 Atherosclerotic heart disease of native coronary artery with unstable angina pectoris: Secondary | ICD-10-CM | POA: Diagnosis not present

## 2018-02-07 DIAGNOSIS — Z85828 Personal history of other malignant neoplasm of skin: Secondary | ICD-10-CM | POA: Diagnosis not present

## 2018-02-07 DIAGNOSIS — I2510 Atherosclerotic heart disease of native coronary artery without angina pectoris: Secondary | ICD-10-CM

## 2018-02-07 DIAGNOSIS — I2584 Coronary atherosclerosis due to calcified coronary lesion: Secondary | ICD-10-CM | POA: Diagnosis not present

## 2018-02-07 DIAGNOSIS — Z800 Family history of malignant neoplasm of digestive organs: Secondary | ICD-10-CM

## 2018-02-07 DIAGNOSIS — Z803 Family history of malignant neoplasm of breast: Secondary | ICD-10-CM | POA: Diagnosis not present

## 2018-02-07 DIAGNOSIS — I480 Paroxysmal atrial fibrillation: Secondary | ICD-10-CM | POA: Diagnosis not present

## 2018-02-07 DIAGNOSIS — Z8249 Family history of ischemic heart disease and other diseases of the circulatory system: Secondary | ICD-10-CM

## 2018-02-07 DIAGNOSIS — Z9841 Cataract extraction status, right eye: Secondary | ICD-10-CM

## 2018-02-07 DIAGNOSIS — Z9842 Cataract extraction status, left eye: Secondary | ICD-10-CM | POA: Diagnosis not present

## 2018-02-07 DIAGNOSIS — K5730 Diverticulosis of large intestine without perforation or abscess without bleeding: Secondary | ICD-10-CM | POA: Diagnosis not present

## 2018-02-07 DIAGNOSIS — I255 Ischemic cardiomyopathy: Secondary | ICD-10-CM | POA: Diagnosis present

## 2018-02-07 DIAGNOSIS — Z86010 Personal history of colonic polyps: Secondary | ICD-10-CM

## 2018-02-07 DIAGNOSIS — Z7982 Long term (current) use of aspirin: Secondary | ICD-10-CM

## 2018-02-07 DIAGNOSIS — I700 Atherosclerosis of aorta: Secondary | ICD-10-CM | POA: Diagnosis present

## 2018-02-07 DIAGNOSIS — Z955 Presence of coronary angioplasty implant and graft: Secondary | ICD-10-CM

## 2018-02-07 DIAGNOSIS — Z794 Long term (current) use of insulin: Secondary | ICD-10-CM

## 2018-02-07 DIAGNOSIS — M797 Fibromyalgia: Secondary | ICD-10-CM | POA: Diagnosis present

## 2018-02-07 DIAGNOSIS — K429 Umbilical hernia without obstruction or gangrene: Secondary | ICD-10-CM | POA: Diagnosis not present

## 2018-02-07 DIAGNOSIS — E782 Mixed hyperlipidemia: Secondary | ICD-10-CM | POA: Diagnosis present

## 2018-02-07 DIAGNOSIS — E1151 Type 2 diabetes mellitus with diabetic peripheral angiopathy without gangrene: Secondary | ICD-10-CM | POA: Diagnosis not present

## 2018-02-07 DIAGNOSIS — I252 Old myocardial infarction: Secondary | ICD-10-CM

## 2018-02-07 DIAGNOSIS — I2582 Chronic total occlusion of coronary artery: Secondary | ICD-10-CM | POA: Diagnosis present

## 2018-02-07 DIAGNOSIS — R079 Chest pain, unspecified: Secondary | ICD-10-CM | POA: Diagnosis present

## 2018-02-07 DIAGNOSIS — K449 Diaphragmatic hernia without obstruction or gangrene: Secondary | ICD-10-CM | POA: Diagnosis present

## 2018-02-07 HISTORY — DX: Chronic combined systolic (congestive) and diastolic (congestive) heart failure: I50.42

## 2018-02-07 HISTORY — PX: RIGHT/LEFT HEART CATH AND CORONARY ANGIOGRAPHY: CATH118266

## 2018-02-07 LAB — POCT I-STAT 3, VENOUS BLOOD GAS (G3P V)
ACID-BASE DEFICIT: 1 mmol/L (ref 0.0–2.0)
Acid-base deficit: 3 mmol/L — ABNORMAL HIGH (ref 0.0–2.0)
Bicarbonate: 22.1 mmol/L (ref 20.0–28.0)
Bicarbonate: 24.3 mmol/L (ref 20.0–28.0)
O2 SAT: 52 %
O2 SAT: 58 %
PCO2 VEN: 38.2 mmHg — AB (ref 44.0–60.0)
PH VEN: 7.387 (ref 7.250–7.430)
PO2 VEN: 30 mmHg — AB (ref 32.0–45.0)
TCO2: 23 mmol/L (ref 22–32)
TCO2: 25 mmol/L (ref 22–32)
pCO2, Ven: 40.4 mmHg — ABNORMAL LOW (ref 44.0–60.0)
pH, Ven: 7.371 (ref 7.250–7.430)
pO2, Ven: 28 mmHg — CL (ref 32.0–45.0)

## 2018-02-07 LAB — POCT I-STAT 3, ART BLOOD GAS (G3+)
ACID-BASE DEFICIT: 3 mmol/L — AB (ref 0.0–2.0)
Bicarbonate: 20.3 mmol/L (ref 20.0–28.0)
O2 SAT: 95 %
PCO2 ART: 30.3 mmHg — AB (ref 32.0–48.0)
PH ART: 7.435 (ref 7.350–7.450)
TCO2: 21 mmol/L — AB (ref 22–32)
pO2, Arterial: 70 mmHg — ABNORMAL LOW (ref 83.0–108.0)

## 2018-02-07 LAB — MRSA PCR SCREENING: MRSA BY PCR: NEGATIVE

## 2018-02-07 LAB — GLUCOSE, CAPILLARY
GLUCOSE-CAPILLARY: 160 mg/dL — AB (ref 70–99)
GLUCOSE-CAPILLARY: 158 mg/dL — AB (ref 70–99)
Glucose-Capillary: 163 mg/dL — ABNORMAL HIGH (ref 70–99)
Glucose-Capillary: 121 mg/dL — ABNORMAL HIGH (ref 70–99)

## 2018-02-07 SURGERY — RIGHT/LEFT HEART CATH AND CORONARY ANGIOGRAPHY
Anesthesia: LOCAL

## 2018-02-07 MED ORDER — INSULIN ASPART 100 UNIT/ML ~~LOC~~ SOLN: 4.0000 [IU] | Freq: Three times a day (TID) | SUBCUTANEOUS | Status: DC

## 2018-02-07 MED ORDER — AMIODARONE HCL 150 MG/3ML IV SOLN
INTRAVENOUS | Status: DC | PRN
  Administered 2018-02-07: 150 mg via INTRAVENOUS

## 2018-02-07 MED ORDER — ONDANSETRON HCL 4 MG/2ML IJ SOLN: 4.0000 mg | Freq: Four times a day (QID) | INTRAMUSCULAR | Status: DC | PRN

## 2018-02-07 MED ORDER — HEPARIN SODIUM (PORCINE) 1000 UNIT/ML IJ SOLN
INTRAMUSCULAR | Status: AC
  Filled 2018-02-07: qty 1

## 2018-02-07 MED ORDER — SODIUM CHLORIDE 0.9 % WEIGHT BASED INFUSION: 1.0000 mL/kg/h | INTRAVENOUS | Status: DC

## 2018-02-07 MED ORDER — FUROSEMIDE 40 MG PO TABS
40.0000 mg | ORAL_TABLET | Freq: Every day | ORAL | Status: DC
  Administered 2018-02-07 – 2018-02-15 (×8): 40 mg via ORAL
  Filled 2018-02-07 (×8): qty 1

## 2018-02-07 MED ORDER — FENTANYL CITRATE (PF) 100 MCG/2ML IJ SOLN
INTRAMUSCULAR | Status: AC
  Filled 2018-02-07: qty 2

## 2018-02-07 MED ORDER — INSULIN ASPART 100 UNIT/ML ~~LOC~~ SOLN
4.0000 [IU] | Freq: Three times a day (TID) | SUBCUTANEOUS | Status: DC
  Administered 2018-02-07 – 2018-02-15 (×20): 4 [IU] via SUBCUTANEOUS

## 2018-02-07 MED ORDER — ACETAMINOPHEN 500 MG PO TABS
1000.0000 mg | ORAL_TABLET | Freq: Every evening | ORAL | Status: DC | PRN
  Administered 2018-02-13: 1000 mg via ORAL
  Filled 2018-02-07 (×2): qty 2

## 2018-02-07 MED ORDER — HEPARIN (PORCINE) IN NACL 1000-0.9 UT/500ML-% IV SOLN
INTRAVENOUS | Status: AC
  Filled 2018-02-07: qty 1000

## 2018-02-07 MED ORDER — SODIUM CHLORIDE 0.9% FLUSH
3.0000 mL | Freq: Two times a day (BID) | INTRAVENOUS | Status: DC
  Administered 2018-02-08 – 2018-02-14 (×12): 3 mL via INTRAVENOUS

## 2018-02-07 MED ORDER — ACETAMINOPHEN 325 MG PO TABS
650.0000 mg | ORAL_TABLET | ORAL | Status: DC | PRN
  Administered 2018-02-08 – 2018-02-14 (×2): 650 mg via ORAL
  Filled 2018-02-07 (×2): qty 2

## 2018-02-07 MED ORDER — AMIODARONE HCL 150 MG/3ML IV SOLN
INTRAVENOUS | Status: AC
  Filled 2018-02-07: qty 3

## 2018-02-07 MED ORDER — VERAPAMIL HCL 2.5 MG/ML IV SOLN
INTRAVENOUS | Status: AC
  Filled 2018-02-07: qty 2

## 2018-02-07 MED ORDER — MIDAZOLAM HCL 2 MG/2ML IJ SOLN
INTRAMUSCULAR | Status: DC | PRN
  Administered 2018-02-07: 1 mg via INTRAVENOUS

## 2018-02-07 MED ORDER — METOPROLOL TARTRATE 5 MG/5ML IV SOLN
INTRAVENOUS | Status: AC
  Filled 2018-02-07: qty 5

## 2018-02-07 MED ORDER — METOPROLOL TARTRATE 5 MG/5ML IV SOLN
INTRAVENOUS | Status: DC | PRN
  Administered 2018-02-07 (×2): 5 mg via INTRAVENOUS

## 2018-02-07 MED ORDER — SODIUM CHLORIDE 0.9 % IV SOLN: 250.0000 mL | INTRAVENOUS | Status: DC | PRN

## 2018-02-07 MED ORDER — ASPIRIN 81 MG PO CHEW: 81.0000 mg | CHEWABLE_TABLET | ORAL | Status: DC

## 2018-02-07 MED ORDER — PANTOPRAZOLE SODIUM 40 MG PO TBEC
40.0000 mg | DELAYED_RELEASE_TABLET | Freq: Every day | ORAL | Status: DC
  Administered 2018-02-07 – 2018-02-15 (×8): 40 mg via ORAL
  Filled 2018-02-07 (×8): qty 1

## 2018-02-07 MED ORDER — HEPARIN (PORCINE) IN NACL 100-0.45 UNIT/ML-% IJ SOLN
900.0000 [IU]/h | INTRAMUSCULAR | Status: DC
  Administered 2018-02-07: 750 [IU]/h via INTRAVENOUS
  Administered 2018-02-09 – 2018-02-11 (×4): 1000 [IU]/h via INTRAVENOUS
  Administered 2018-02-13: 900 [IU]/h via INTRAVENOUS
  Filled 2018-02-07 (×7): qty 250

## 2018-02-07 MED ORDER — MIDAZOLAM HCL 2 MG/2ML IJ SOLN
INTRAMUSCULAR | Status: AC
  Filled 2018-02-07: qty 2

## 2018-02-07 MED ORDER — SODIUM CHLORIDE 0.9 % IV SOLN: INTRAVENOUS | Status: AC

## 2018-02-07 MED ORDER — HEPARIN (PORCINE) IN NACL 1000-0.9 UT/500ML-% IV SOLN
INTRAVENOUS | Status: DC | PRN
  Administered 2018-02-07 (×3): 500 mL

## 2018-02-07 MED ORDER — ATORVASTATIN CALCIUM 40 MG PO TABS
40.0000 mg | ORAL_TABLET | Freq: Every day | ORAL | Status: DC
  Administered 2018-02-07 – 2018-02-14 (×8): 40 mg via ORAL
  Filled 2018-02-07 (×8): qty 1

## 2018-02-07 MED ORDER — ASPIRIN EC 81 MG PO TBEC
81.0000 mg | DELAYED_RELEASE_TABLET | Freq: Every day | ORAL | Status: DC
  Administered 2018-02-08 – 2018-02-15 (×7): 81 mg via ORAL
  Filled 2018-02-07 (×6): qty 1

## 2018-02-07 MED ORDER — LOSARTAN POTASSIUM 25 MG PO TABS
25.0000 mg | ORAL_TABLET | Freq: Every day | ORAL | Status: DC
  Administered 2018-02-07 – 2018-02-15 (×8): 25 mg via ORAL
  Filled 2018-02-07 (×8): qty 1

## 2018-02-07 MED ORDER — POTASSIUM CHLORIDE CRYS ER 10 MEQ PO TBCR
10.0000 meq | EXTENDED_RELEASE_TABLET | Freq: Every day | ORAL | Status: DC
  Administered 2018-02-07 – 2018-02-15 (×8): 10 meq via ORAL
  Filled 2018-02-07 (×11): qty 1

## 2018-02-07 MED ORDER — CARVEDILOL 6.25 MG PO TABS
6.2500 mg | ORAL_TABLET | Freq: Two times a day (BID) | ORAL | Status: DC
  Administered 2018-02-07 – 2018-02-15 (×15): 6.25 mg via ORAL
  Filled 2018-02-07 (×15): qty 1

## 2018-02-07 MED ORDER — SODIUM CHLORIDE 0.9% FLUSH: 3.0000 mL | INTRAVENOUS | Status: DC | PRN

## 2018-02-07 MED ORDER — SODIUM CHLORIDE 0.9 % WEIGHT BASED INFUSION
3.0000 mL/kg/h | INTRAVENOUS | Status: DC
  Administered 2018-02-07: 10:00:00 via INTRAVENOUS

## 2018-02-07 MED ORDER — LIDOCAINE HCL (PF) 1 % IJ SOLN
INTRAMUSCULAR | Status: DC | PRN
  Administered 2018-02-07 (×2): 2 mL

## 2018-02-07 MED ORDER — VERAPAMIL HCL 2.5 MG/ML IV SOLN
INTRAVENOUS | Status: DC | PRN
  Administered 2018-02-07: 10 mL via INTRA_ARTERIAL

## 2018-02-07 MED ORDER — HEPARIN SODIUM (PORCINE) 1000 UNIT/ML IJ SOLN
INTRAMUSCULAR | Status: DC | PRN
  Administered 2018-02-07: 3000 [IU] via INTRAVENOUS

## 2018-02-07 MED ORDER — INSULIN ASPART 100 UNIT/ML ~~LOC~~ SOLN
0.0000 [IU] | Freq: Three times a day (TID) | SUBCUTANEOUS | Status: DC
  Administered 2018-02-07 (×2): 3 [IU] via SUBCUTANEOUS
  Administered 2018-02-08: 5 [IU] via SUBCUTANEOUS
  Administered 2018-02-08: 2 [IU] via SUBCUTANEOUS
  Administered 2018-02-09 (×2): 3 [IU] via SUBCUTANEOUS
  Administered 2018-02-09: 5 [IU] via SUBCUTANEOUS
  Administered 2018-02-10 – 2018-02-11 (×2): 3 [IU] via SUBCUTANEOUS
  Administered 2018-02-11 (×2): 2 [IU] via SUBCUTANEOUS
  Administered 2018-02-12 – 2018-02-13 (×4): 3 [IU] via SUBCUTANEOUS
  Administered 2018-02-13: 2 [IU] via SUBCUTANEOUS
  Administered 2018-02-14 – 2018-02-15 (×4): 3 [IU] via SUBCUTANEOUS

## 2018-02-07 MED ORDER — LIDOCAINE HCL (PF) 1 % IJ SOLN
INTRAMUSCULAR | Status: AC
  Filled 2018-02-07: qty 30

## 2018-02-07 MED ORDER — INSULIN ASPART 100 UNIT/ML ~~LOC~~ SOLN: 0.0000 [IU] | Freq: Three times a day (TID) | SUBCUTANEOUS | Status: DC

## 2018-02-07 MED ORDER — SODIUM CHLORIDE 0.9% FLUSH: 3.0000 mL | Freq: Two times a day (BID) | INTRAVENOUS | Status: DC

## 2018-02-07 MED ORDER — FENTANYL CITRATE (PF) 100 MCG/2ML IJ SOLN
INTRAMUSCULAR | Status: DC | PRN
  Administered 2018-02-07: 25 ug via INTRAVENOUS

## 2018-02-07 MED ORDER — IOHEXOL 350 MG/ML SOLN
INTRAVENOUS | Status: DC | PRN
  Administered 2018-02-07: 55 mL via INTRA_ARTERIAL

## 2018-02-07 SURGICAL SUPPLY — 13 items
CATH 5FR JL3.5 JR4 ANG PIG MP (CATHETERS) ×2 IMPLANT
CATH BALLN WEDGE 5F 110CM (CATHETERS) ×1 IMPLANT
DEVICE RAD COMP TR BAND LRG (VASCULAR PRODUCTS) ×1 IMPLANT
GLIDESHEATH SLEND SS 6F .021 (SHEATH) ×2 IMPLANT
GUIDEWIRE INQWIRE 1.5J.035X260 (WIRE) IMPLANT
INQWIRE 1.5J .035X260CM (WIRE) ×2
KIT HEART LEFT (KITS) ×2 IMPLANT
PACK CARDIAC CATHETERIZATION (CUSTOM PROCEDURE TRAY) ×2 IMPLANT
SHEATH GLIDE SLENDER 4/5FR (SHEATH) ×2 IMPLANT
SYR MEDRAD MARK V 150ML (SYRINGE) ×2 IMPLANT
TRANSDUCER W/STOPCOCK (MISCELLANEOUS) ×2 IMPLANT
TUBING CIL FLEX 10 FLL-RA (TUBING) ×2 IMPLANT
WIRE HI TORQ VERSACORE-J 145CM (WIRE) ×1 IMPLANT

## 2018-02-07 NOTE — Progress Notes (Signed)
Admission from the cath lab by bed awake and alert. Right radial TR Band intact with minimal bleeding. Elevated with pillow, pulse ox to right thumb.

## 2018-02-07 NOTE — Consult Note (Addendum)
[address]       [place] [address]             [phone]           [address]       [place] [address]             [phone]        Addelynn E Arko [place] Medical Record #[id number] Date of Birth: [birth year]  Referring: No ref. provider found Primary Care: Jinny Sanders, MD Primary Cardiologist:No primary care provider on file.  Chief Complaint: Shortness of breath  History of Present Illness:    The patient is an 84 year old female with a history of multiple cardiac comorbidities including congestive heart failure, diabetes, peripheral arterial disease and aortic stenosis.  Patient had a hospitalization in December 2018 after a fall and right hip fracture and during his hospitalization she had a non-STEMI.  She was noted at that time to have severe LV dysfunction and heart failure.  Conservative management was recommended at that time in the setting of anemia, advanced age as well as the recent hip fracture.  More recently the patient has been developing increasing symptoms of shortness of breath and chest discomfort with activity.  She states that she has difficulty catching her breath a lot of the time and these episodes are associated with central chest pressure.  The symptoms have become progressive with less activity but she does not have resting chest pain.  She also describes orthopnea but not PND.  She complains also of marked fatigue.  Due to these findings she was reevaluated by cardiology and recommended to undergo cardiac catheterization which was done on today's date.  Additionally she has undergone echocardiogram.   We are asked to see the patient in consideration for surgery.  Following repair of her hip fracture last year she developed sacral and heel decubiti, took almost a year to heal.  Patient notes that she does get around her house with a walker, rarely she has enough energy to get out and drive her car.  Most of  her time spent at home in the chair watching TV.  Currently she lives with her son.   Current Activity/ Functional Status: Patient is independent with mobility/ambulation, transfers, ADL's, IADL's.   Zubrod Score: At the time of surgery this patient's most appropriate activity status/level should be described as: []     0    Normal activity, no symptoms []     1    Restricted in physical strenuous activity but ambulatory, able to do out light work [x]     2    Ambulatory and capable of self care, unable to do work activities, up and about                 more than 50%  Of the time                            []     3    Only limited self care, in bed greater than 50% of waking hours []     4    Completely disabled, no self care, confined to bed or chair []     5    Moribund  Past Medical History:  Diagnosis Date  . Anxiety   . Diaphragmatic hernia without mention of obstruction or gangrene   . Diverticulosis of colon (without mention of  hemorrhage) 2009  . Fibromyalgia   . GERD (gastroesophageal reflux disease)   . Hiatal hernia 2009   s/p nissen fundoplication 1610.    Marland Kitchen Hx of adenomatous colonic polyps 2007, 2009   Adenomatous polyps in 2007 and 2009.  Hyperplastic polyps in 2009  . Hyperlipidemia   . IBS (irritable bowel syndrome)   . Internal hemorrhoids without mention of complication 9604   Seen at colonoscopy.  . Thyroid disease   . Type II or unspecified type diabetes mellitus with neurological manifestations, not stated as uncontrolled(250.60)     Past Surgical History:  Procedure Laterality Date  . ABDOMINAL HYSTERECTOMY    . BASAL CELL CARCINOMA EXCISION  04/2016   corner of left eye  . bmd  2007  . CATARACT EXTRACTION    . ESOPHAGOGASTRODUODENOSCOPY  2007  . HERNIA REPAIR    . INTRAMEDULLARY (IM) NAIL INTERTROCHANTERIC Right 03/01/2017   Procedure: INTRAMEDULLARY (IM) NAIL INTERTROCHANTRIC;  Surgeon: Hiram Gash, MD;  Location: [facility];  Service: Orthopedics;   Laterality: Right;  . LAPAROSCOPIC NISSEN FUNDOPLICATION  5409  . ORIF WRIST FRACTURE Right 03/01/2017   Procedure: OPEN REDUCTION INTERNAL FIXATION (ORIF) WRIST FRACTURE;  Surgeon: Hiram Gash, MD;  Location: [facility];  Service: Orthopedics;  Laterality: Right;  . THYROIDECTOMY, PARTIAL      Social History   Tobacco Use  Smoking Status Former Smoker  . Packs/day: 0.25  . Years: 4.00  . Pack years: 1.00  . Types: Cigarettes  . Last attempt to quit: 03/10/1981  . Years since quitting: 36.9  Smokeless Tobacco Never Used    Social History   Substance and Sexual Activity  Alcohol Use No  . Alcohol/week: 0.0 standard drinks     Allergies  Allergen Reactions  . Metformin     unspecified  . Penicillins     unspecified Has patient had a PCN reaction causing immediate rash, facial/tongue/throat swelling, SOB or lightheadedness with hypotension: Unknown Has patient had a PCN reaction causing severe rash involving mucus membranes or skin necrosis: Unknown Has patient had a PCN reaction that required hospitalization: No Has patient had a PCN reaction occurring within the last 10 years: No If all of the above answers are "NO", then may proceed with Cephalosporin use.   . Codeine Palpitations    Rapid heart rate  . Fenofibrate Palpitations  . Pioglitazone Rash    Current Facility-Administered Medications  Medication Dose Route Frequency Provider Last Rate Last Dose  . 0.9 %  sodium chloride infusion  250 mL Intravenous PRN Sherren Mocha, MD      . acetaminophen (TYLENOL) tablet 1,000 mg  1,000 mg Oral QHS PRN Sherren Mocha, MD      . acetaminophen (TYLENOL) tablet 650 mg  650 mg Oral Q4H PRN Sherren Mocha, MD      . Derrill Memo ON 02/08/2018] aspirin EC tablet 81 mg  81 mg Oral Daily Sherren Mocha, MD      . atorvastatin (LIPITOR) tablet 40 mg  40 mg Oral q1800 Sherren Mocha, MD   40 mg at 02/07/18 1746  . carvedilol (COREG) tablet 6.25 mg  6.25 mg Oral BID WC Sherren Mocha, MD   6.25 mg at 02/07/18 1746  . furosemide (LASIX) tablet 40 mg  40 mg Oral Daily Sherren Mocha, MD   40 mg at 02/07/18 1444  . heparin ADULT infusion 100 units/mL (25000 units/241mL sodium chloride 0.45%)  750 Units/hr Intravenous Continuous Sherren Mocha, MD      .  insulin aspart (novoLOG) injection 0-15 Units  0-15 Units Subcutaneous TID WC Sherren Mocha, MD   3 Units at 02/07/18 1748  . insulin aspart (novoLOG) injection 4 Units  4 Units Subcutaneous TID WC Sherren Mocha, MD   4 Units at 02/07/18 1443  . losartan (COZAAR) tablet 25 mg  25 mg Oral Daily Sherren Mocha, MD   25 mg at 02/07/18 1444  . ondansetron (ZOFRAN) injection 4 mg  4 mg Intravenous Q6H PRN Sherren Mocha, MD      . pantoprazole (PROTONIX) EC tablet 40 mg  40 mg Oral Daily Sherren Mocha, MD   40 mg at 02/07/18 1444  . potassium chloride (K-DUR) CR tablet 10 mEq  10 mEq Oral Daily Sherren Mocha, MD   10 mEq at 02/07/18 1444  . sodium chloride flush (NS) 0.9 % injection 3 mL  3 mL Intravenous Q12H Sherren Mocha, MD      . sodium chloride flush (NS) 0.9 % injection 3 mL  3 mL Intravenous PRN Sherren Mocha, MD        Medications Prior to Admission  Medication Sig Dispense Refill Last Dose  . acetaminophen (TYLENOL) 500 MG tablet Take 1,000 mg by mouth at bedtime as needed for moderate pain.    Past Week at Unknown time  . aspirin 81 MG tablet Take 81 mg by mouth daily.     02/07/2018 at 0700  . atorvastatin (LIPITOR) 40 MG tablet Take 1 tablet (40 mg total) by mouth daily at 6 PM. 30 tablet 11 02/06/2018 at 1800  . Carboxymethylcellulose Sodium (THERATEARS OP) Place 1 drop into both eyes daily as needed (dry eyes).   02/06/2018 at Unknown time  . furosemide (LASIX) 40 MG tablet Take 1 tablet (40 mg total) by mouth daily. 90 tablet 3 02/06/2018 at Unknown time  . insulin NPH Human (HUMULIN N,NOVOLIN N) 100 UNIT/ML injection Inject 15 Units into the skin at bedtime.    02/05/2018 at Unknown time  .  NOVOLOG FLEXPEN 100 UNIT/ML FlexPen INJECT 10 UNITS SUBCUTANEOUSLY THREE TIMES DAILY WITH MEALS (Patient taking differently: Inject 10 Units into the skin 3 (three) times daily with meals. ) 15 mL 2 Taking  . omeprazole (PRILOSEC) 20 MG capsule Take 20 mg by mouth 2 (two) times daily before a meal.   02/06/2018 at 0800  . potassium chloride (K-DUR) 10 MEQ tablet Take 1 tablet (10 mEq total) by mouth daily. 90 tablet 3 02/06/2018 at Unknown time  . carvedilol (COREG) 6.25 MG tablet TAKE 1 TABLET BY MOUTH TWICE DAILY WITH MEALS (Patient not taking: Reported on 02/01/2018) 180 tablet 0 Not Taking at Unknown time  . losartan (COZAAR) 25 MG tablet TAKE 1 TABLET BY MOUTH ONCE DAILY (Patient not taking: Reported on 02/01/2018) 90 tablet 0 Not Taking at Unknown time  . ONE TOUCH ULTRA TEST test strip USE 1 STRIP TO CHECK GLUCOSE 4 TIMES DAILY 125 each 11     Family History  Problem Relation Age of Onset  . Uterine cancer Mother   . Cancer Mother   . Diabetes Maternal Grandfather   . Diabetes Son   . Hyperlipidemia Son   . Heart disease Paternal Grandmother   . Breast cancer Paternal Aunt   . Colon cancer Neg Hx   . Stomach cancer Neg Hx      Review of Systems:   Review of Systems  Constitutional: Positive for chills, diaphoresis and malaise/fatigue. Negative for fever and weight loss.  HENT: Positive for congestion, hearing  loss and nosebleeds. Negative for ear discharge, ear pain, sinus pain, sore throat and tinnitus.        Some hoarseness  Eyes: Positive for blurred vision, photophobia and discharge. Negative for double vision, pain and redness.  Respiratory: Positive for cough, sputum production and shortness of breath. Negative for hemoptysis, wheezing and stridor.   Cardiovascular: Positive for chest pain, palpitations, orthopnea, claudication and leg swelling. Negative for PND.  Gastrointestinal: Positive for constipation and heartburn. Negative for abdominal pain, blood in stool,  diarrhea, nausea and vomiting.  Genitourinary: Positive for frequency and urgency. Negative for dysuria, flank pain and hematuria.  Musculoskeletal: Positive for falls, joint pain and myalgias. Negative for back pain and neck pain.  Skin: Positive for rash. Negative for itching.       Rash in groin region, not fungalper patient report- sen previously for same thing by dermatology  Neurological: Positive for dizziness, tingling, sensory change, speech change and weakness. Negative for tremors, focal weakness, seizures, loss of consciousness and headaches.  Endo/Heme/Allergies: Positive for environmental allergies. Negative for polydipsia. Bruises/bleeds easily.  Psychiatric/Behavioral: Positive for memory loss. Negative for depression, hallucinations, substance abuse and suicidal ideas. The patient is nervous/anxious and has insomnia.      Physical Exam  Constitutional: She is oriented to person, place, and time. She appears well-developed and well-nourished. She appears distressed.  HENT:  Head: Normocephalic and atraumatic.  Mouth/Throat: Oropharynx is clear and moist. No oropharyngeal exudate.  Lower teeth removed, + upper denture  Eyes: Pupils are equal, round, and reactive to light. Conjunctivae and EOM are normal. Right eye exhibits no discharge. Left eye exhibits no discharge. No scleral icterus.  Neck: Normal range of motion. Neck supple. No JVD present. No tracheal deviation present. No thyromegaly present.  + bilat carotid bruit  Cardiovascular: Normal rate and regular rhythm. Exam reveals no gallop and no friction rub.  Murmur heard. 6-3/8 systolic murmur Absent DP/PT pulses bilat  Pulmonary/Chest: Effort normal and breath sounds normal. No stridor. No respiratory distress. She has no wheezes. She has no rales. She exhibits no tenderness.  Coarse BS  Abdominal: Soft. Bowel sounds are normal. She exhibits no distension and no mass. There is no tenderness. There is no rebound and no  guarding.  Musculoskeletal: She exhibits edema. She exhibits no tenderness or deformity.  Lymphadenopathy:    She has no cervical adenopathy.  Neurological: She is alert and oriented to person, place, and time.  Skin: Skin is warm and dry. No rash noted. She is not diaphoretic. No erythema. No pallor.  Psychiatric: She has a normal mood and affect. Her behavior is normal. Judgment and thought content normal.   Diagnostic Studies & Laboratory data:     Recent Radiology Findings:   No results found.   I have independently reviewed the above radiologic studies and discussed with the patient   Recent Lab Findings: Lab Results  Component Value Date   WBC 9.9 01/17/2018   HGB 13.1 01/17/2018   HCT 39.3 01/17/2018   PLT 239 01/17/2018   GLUCOSE 126 (H) 01/17/2018   CHOL 248 (H) 06/23/2016   TRIG (H) 06/23/2016    406.0 Triglyceride is over 400; calculations on Lipids are invalid.   HDL 38.40 (L) 06/23/2016   LDLDIRECT 101.0 06/23/2016   LDLCALC 79 04/13/2007   ALT 26 01/17/2018   AST 31 01/17/2018   NA 137 01/17/2018   K 4.8 01/17/2018   CL 99 01/17/2018   CREATININE 0.82 01/17/2018   BUN 14  01/17/2018   CO2 22 01/17/2018   TSH 0.294 (L) 03/05/2017   INR 1.18 03/05/2017   HGBA1C 6.1 (A) 01/05/2018    Study Result   Result status: Final result                           [facility]                        [address], [place] [address]                            [phone]  ------------------------------------------------------------------- Transthoracic Echocardiography  Patient:    Natausha, Jungwirth MR #:       [id number] Study Date: 01/25/2018 Gender:     F Age:        46 Height:     160 cm Weight:     65.5 kg BSA:        1.72 m^2 Pt. Status: Room:   ATTENDING    Sherren Mocha, MD  ORDERING     Sherren Mocha, MD  [place], MD  PERFORMING   [facility], Outpatient  SONOGRAPHER  [facility],  RDCS  cc:  ------------------------------------------------------------------- LV EF: 55% -   60%  ------------------------------------------------------------------- Indications:      CHF (I50.23).  ------------------------------------------------------------------- History:   PMH:   Dyspnea and murmur.  PMH:   Myocardial infarction.  Risk factors:  Family history of coronary artery disease. Former tobacco use. Diabetes mellitus. Dyslipidemia.  ------------------------------------------------------------------- Study Conclusions  - Left ventricle: The cavity size was normal. Wall thickness was   normal. Systolic function was normal. The estimated ejection   fraction was in the range of 55% to 60%. Wall motion was normal;   there were no regional wall motion abnormalities. Left   ventricular diastolic function parameters were normal. - Aortic valve: Valve mobility was restricted. There was moderate   stenosis. Valve area (VTI): 1.69 cm^2. Valve area (Vmax): 1.01   cm^2. Valve area (Vmean): 1.72 cm^2. - Mitral valve: Calcified annulus. Valve area by pressure   half-time: 2 cm^2. Valve area by continuity equation (using LVOT   flow): 2.82 cm^2. - Left atrium: The atrium was mildly dilated.  Impressions:  - Compared to November 2018, there is substantial improvement in   left ventricular function due to recovery of anteroapical   contractility. Increase aoertic valve gradients are due to   improved LV function, not due to worsening stenosis.  ------------------------------------------------------------------- Study data:  Comparison was made to the study of 03/06/2017.  Study status:  Routine.  Procedure:  Transthoracic echocardiography. Image quality was adequate.          Transthoracic echocardiography.  M-mode, complete 2D, spectral Doppler, and color Doppler.  Birthdate:  Patient birthdate: Aug 10, 1929.  Age:  Patient is 84 yr old.  Sex:  Gender: female.    BMI:  25.6 kg/m^2.  Blood pressure:     62/84  Patient status:  Outpatient.  Study date: Study date: 01/25/2018. Study time: 11:36 AM.  Location:  162.  -------------------------------------------------------------------  ------------------------------------------------------------------- Left ventricle:  The cavity size was normal. Wall thickness was normal. Systolic function was normal. The estimated ejection fraction was in the range of 55%  to 60%. Wall motion was normal; there were no regional wall motion abnormalities. The transmitral flow pattern was normal. The deceleration time of the early transmitral flow velocity was normal. The pulmonary vein flow pattern was normal. The tissue Doppler parameters were normal. Left ventricular diastolic function parameters were normal.  ------------------------------------------------------------------- Aortic valve:   Trileaflet; mildly thickened, mildly calcified leaflets. Valve mobility was restricted.  Doppler:   There was moderate stenosis.   There was no significant regurgitation.    VTI ratio of LVOT to aortic valve: 0.32. Valve area (VTI): 1.69 cm^2. Indexed valve area (VTI): 0.99 cm^2/m^2. Peak velocity ratio of LVOT to aortic valve: 0.32. Valve area (Vmax): 1.01 cm^2. Indexed valve area (Vmax): 0.59 cm^2/m^2. Mean velocity ratio of LVOT to aortic valve: 0.32. Valve area (Vmean): 1.72 cm^2. Indexed valve area (Vmean): 1 cm^2/m^2.    Mean gradient (S): 27 mm Hg. Peak gradient (S): 48 mm Hg.  ------------------------------------------------------------------- Aorta:  Aortic root: The aortic root was normal in size. Ascending aorta: The ascending aorta was normal in size.  ------------------------------------------------------------------- Mitral valve:   Calcified annulus. Leaflet separation was normal. Doppler:  Transvalvular velocity was within the normal range. There was no evidence for stenosis. There was trivial  regurgitation. Valve area by pressure half-time: 2 cm^2. Indexed valve area by pressure half-time: 1.16 cm^2/m^2. Valve area by continuity equation (using LVOT flow): 2.82 cm^2. Indexed valve area by continuity equation (using LVOT flow): 1.64 cm^2/m^2.    Mean gradient (D): 3 mm Hg. Peak gradient (D): 6 mm Hg.  ------------------------------------------------------------------- Left atrium:  The atrium was mildly dilated.  ------------------------------------------------------------------- Right ventricle:  The cavity size was normal. Systolic function was normal.  ------------------------------------------------------------------- Pulmonic valve:    Structurally normal valve.   Cusp separation was normal.  Doppler:  Transvalvular velocity was within the normal range. There was no regurgitation.  ------------------------------------------------------------------- Tricuspid valve:   Structurally normal valve.   Leaflet separation was normal.  Doppler:  Transvalvular velocity was within the normal range. There was trivial regurgitation.  ------------------------------------------------------------------- Pulmonary artery:   Systolic pressure was within the normal range.   ------------------------------------------------------------------- Right atrium:  The atrium was normal in size.  ------------------------------------------------------------------- Pericardium:  There was no pericardial effusion.  ------------------------------------------------------------------- Measurements   Left ventricle                          Value           Reference  LV ID, ED, PLAX chordal         (L)     40.6   mm       43 - 52  LV ID, ES, PLAX chordal                 31.6   mm       23 - 38  LV fx shortening, PLAX chordal  (L)     22     %        >=29  LV PW thickness, ED                     7.14   mm       ----------  IVS/LV PW ratio, ED             (H)     1.74            <=1.3   Stroke volume, 2D  133    ml       ----------  Stroke volume/bsa, 2D                   77     ml/m^2   ----------  LV e&', lateral                          11.7   cm/s     ----------  LV E/e&', lateral                        10.09           ----------  LV e&', medial                           7.72   cm/s     ----------  LV E/e&', medial                         15.28           ----------  LV e&', average                          9.71   cm/s     ----------  LV E/e&', average                        12.15           ----------  LV ejection time                        320    ms       ----------    Ventricular septum                      Value           Reference  IVS thickness, ED                       12.4   mm       ----------    LVOT                                    Value           Reference  LVOT ID, S                              26     mm       ----------  LVOT area                               5.31   cm^2     ----------  LVOT peak velocity, S                   112    cm/s     ----------  LVOT mean velocity, S                   77.4   cm/s     ----------  LVOT VTI, S  25     cm       ----------  LVOT peak gradient, S                   5      mm Hg    ----------    Aortic valve                            Value           Reference  Aortic valve peak velocity, S           345.98 cm/s     ----------  Aortic valve mean velocity, S           239.25 cm/s     ----------  Aortic valve VTI, S                     78.34  cm       ----------  Aortic mean gradient, S                 27     mm Hg    ----------  Aortic peak gradient, S                 48     mm Hg    ----------  VTI ratio, LVOT/AV                      0.32            ----------  Aortic valve area, VTI                  1.69   cm^2     ----------  Aortic valve area/bsa, VTI              0.99   cm^2/m^2 ----------  Velocity ratio, peak, LVOT/AV           0.32            ----------   Aortic valve area, peak                 1.01   cm^2     ----------  velocity  Aortic valve area/bsa, peak             0.59   cm^2/m^2 ----------  velocity  Velocity ratio, mean, LVOT/AV           0.32            ----------  Aortic valve area, mean                 1.72   cm^2     ----------  velocity  Aortic valve area/bsa, mean             1      cm^2/m^2 ----------  velocity  Aortic regurg pressure                  605    ms       ----------  half-time    Aorta                                   Value           Reference  Aortic root ID, ED  33     mm       ----------  Ascending aorta ID, A-P, S              35     mm       ----------    Left atrium                             Value           Reference  LA ID, A-P, ES                          39.55  mm       ----------  LA ID/bsa, A-P                  (H)     2.3    cm/m^2   <=2.2  LA volume, S                            72.4   ml       ----------  LA volume/bsa, S                        42.1   ml/m^2   ----------  LA volume, ES, 1-p A4C                  90.6   ml       ----------  LA volume/bsa, ES, 1-p A4C              52.7   ml/m^2   ----------  LA volume, ES, 1-p A2C                  54.4   ml       ----------  LA volume/bsa, ES, 1-p A2C              31.6   ml/m^2   ----------    Mitral valve                            Value           Reference  Mitral E-wave peak velocity             118    cm/s     ----------  Mitral A-wave peak velocity             141    cm/s     ----------  Mitral mean velocity, D                 72.3   cm/s     ----------  Mitral deceleration time        (H)     299    ms       150 - 230  Mitral pressure half-time               110    ms       ----------  Mitral mean gradient, D                 3      mm Hg    ----------  Mitral peak gradient, D  6      mm Hg    ----------  Mitral E/A ratio, peak                  0.8             ----------  Mitral valve area, PHT, DP               2      cm^2     ----------  Mitral valve area/bsa, PHT, DP          1.16   cm^2/m^2 ----------  Mitral valve area, LVOT                 2.82   cm^2     ----------  continuity  Mitral valve area/bsa, LVOT             1.64   cm^2/m^2 ----------  continuity  Mitral annulus VTI, D                   47.1   cm       ----------  Mitral regurg VTI, PISA                 172    cm       ----------    Pulmonary arteries                      Value           Reference  PA pressure, S, DP                      28     mm Hg    <=30    Tricuspid valve                         Value           Reference  Tricuspid regurg peak velocity          250    cm/s     ----------  Tricuspid peak RV-RA gradient           25     mm Hg    ----------    Right atrium                            Value           Reference  RA ID, S-I, ES, A4C                     45.5   mm       34 - 49  RA area, ES, A4C                        11.7   cm^2     8.3 - 19.5  RA volume, ES, A/L                      24.6   ml       ----------  RA volume/bsa, ES, A/L                  14.3   ml/m^2   ----------    Systemic veins  Value           Reference  Estimated CVP                           3      mm Hg    ----------    Right ventricle                         Value           Reference  TAPSE                                   21.1   mm       ----------  RV pressure, S, DP                      28     mm Hg    <=30  RV s&', lateral, S                       14     cm/s     ----------  Legend: (L)  and  (H)  mark values outside specified reference range.  ------------------------------------------------------------------- Prepared and Electronically Authenticated by  Sanda Klein, MD 2019-10-17T16:43:04   Ost LM to Dist LM lesion is 95% stenosed.  Ost LAD lesion is 90% stenosed.  Ost Cx to Prox Cx lesion is 80% stenosed.  Prox RCA lesion is 100% stenosed.  There is moderate left ventricular  systolic dysfunction.  LV end diastolic pressure is moderately elevated.  The left ventricular ejection fraction is 35-45% by visual estimate.  Ost 2nd Mrg to 2nd Mrg lesion is 50% stenosed.   1.  Critical stenosis of the distal left mainstem extending into the proximal LAD and circumflex 2.  Chronic total occlusion of the RCA collateralized by right to right and left to right collaterals 3.  Moderate segmental LV systolic dysfunction 4.  Moderate aortic stenosis with mean transvalvular gradient 16 mmHg and calculated aortic valve area 0.95 cm 5.  Reduced cardiac output by Fick measurement  Recommend: Hospital admission, IV heparin, consider revascularization options of multivessel CABG versus hemodynamically supported PCI.  PCI would likely come with prohibitive risk if the patient cannot be hemodynamically supported.  Even with support her risk will be increased with heavy calcification and need to perform atherectomy of the left mainstem in the presence of an occluded RCA.  Discussed at length with the patient and her son. Appreciate cardiac surgical consultation.     Assessment / Plan:  Severe multi-vessel coronary artery disease and moderate aortic stenosis. Type 2 diabetes-insulin-dependent with diabetic neuropathy Thyroid disease-unspecified History of right hip fracture History of hyperlipidemia History of IBS History of adenomatous colon polyps History of hiatal hernia History of GERD Fibromyalgia Diverticulosis Peripheral arterial disease History of nonhealing heel ulcers requiring wound care for approximately 1 year   Plan: Difficult situation as from a cardiac standpoint the patient needs aortic valve replacement and coronary artery bypass grafting.  However her multiple comorbidities prolonged recovery from hip fracture and overall functional status makes her very poor candidate to consider for coronary artery bypass grafting and aortic valve replacement.  With  her comorbidities and fragile state I suspect she could successfully have aortic valve replaced and coronary artery bypass grafting, getting out of the  OR but likely to do poorly in recovery.  I discussed with the patient and her son the implications of major cardiac surgery at almost 84 years old.     Grace Isaac, MD 02/07/2018 7:31 PM Pager [phone]

## 2018-02-07 NOTE — Progress Notes (Signed)
ANTICOAGULATION CONSULT NOTE - Initial Consult  Pharmacy Consult for Heparin Indication: chest pain/ACS s/p Cath plan CABG  Allergies  Allergen Reactions  . Metformin     unspecified  . Penicillins     unspecified Has patient had a PCN reaction causing immediate rash, facial/tongue/throat swelling, SOB or lightheadedness with hypotension: Unknown Has patient had a PCN reaction causing severe rash involving mucus membranes or skin necrosis: Unknown Has patient had a PCN reaction that required hospitalization: No Has patient had a PCN reaction occurring within the last 10 years: No If all of the above answers are "NO", then may proceed with Cephalosporin use.   . Codeine Palpitations    Rapid heart rate  . Fenofibrate Palpitations  . Pioglitazone Rash    Patient Measurements: Height: 5\' 3"  (160 cm) Weight: 145 lb 1 oz (65.8 kg) IBW/kg (Calculated) : 52.4   Vital Signs: Temp: 98.4 F (36.9 C) (10/30 1528) Temp Source: Oral (10/30 1528) BP: 111/80 (10/30 1528) Pulse Rate: 78 (10/30 1600)  Labs: No results for input(s): HGB, HCT, PLT, APTT, LABPROT, INR, HEPARINUNFRC, HEPRLOWMOCWT, CREATININE, CKTOTAL, CKMB, TROPONINI in the last 72 hours.  CrCl cannot be calculated (Patient's most recent lab result is older than the maximum 21 days allowed.).   Medical History: Past Medical History:  Diagnosis Date  . Anxiety   . Diaphragmatic hernia without mention of obstruction or gangrene   . Diverticulosis of colon (without mention of hemorrhage) 2009  . Fibromyalgia   . GERD (gastroesophageal reflux disease)   . Hiatal hernia 2009   s/p nissen fundoplication 7858.    Marland Kitchen Hx of adenomatous colonic polyps 2007, 2009   Adenomatous polyps in 2007 and 2009.  Hyperplastic polyps in 2009  . Hyperlipidemia   . IBS (irritable bowel syndrome)   . Internal hemorrhoids without mention of complication 8502   Seen at colonoscopy.  . Thyroid disease   . Type II or unspecified type diabetes  mellitus with neurological manifestations, not stated as uncontrolled(250.60)       Assessment: 87yof admitted with CP take to cath lab found to have multivessel CAD - plan CABG.  Heparin until OR - start 4hr after TR banc off - off at 4pm  Goal of Therapy:  Heparin level 0.3-0.7 units/ml Monitor platelets by anticoagulation protocol: Yes   Plan:  Heparin drip 750 uts/hr start at 8pm - 4hr after TR band off Daily HL, CBC  Bonnita Nasuti Pharm.D. CPP, BCPS Clinical Pharmacist [phone] 02/07/2018 4:26 PM

## 2018-02-07 NOTE — Progress Notes (Signed)
TR  Band removed, 2x2 and tegaderm applied , no bleeding noted. Continue to monitor.

## 2018-02-07 NOTE — Interval H&P Note (Signed)
History and Physical Interval Note:  02/07/2018 11:14 AM  [name]  has presented today for surgery, with the diagnosis of as   cad  The various methods of treatment have been discussed with the patient and family. After consideration of risks, benefits and other options for treatment, the patient has consented to  Procedure(s): RIGHT/LEFT HEART CATH AND CORONARY ANGIOGRAPHY (N/A) as a surgical intervention .  The patient's history has been reviewed, patient examined, no change in status, stable for surgery.  I have reviewed the patient's chart and labs.  Questions were answered to the patient's satisfaction.     Sherren Mocha

## 2018-02-08 ENCOUNTER — Encounter (HOSPITAL_COMMUNITY): Payer: Self-pay | Admitting: Cardiovascular Disease

## 2018-02-08 ENCOUNTER — Inpatient Hospital Stay (HOSPITAL_COMMUNITY): Payer: PPO

## 2018-02-08 DIAGNOSIS — I350 Nonrheumatic aortic (valve) stenosis: Secondary | ICD-10-CM

## 2018-02-08 DIAGNOSIS — I480 Paroxysmal atrial fibrillation: Secondary | ICD-10-CM | POA: Diagnosis not present

## 2018-02-08 DIAGNOSIS — I5043 Acute on chronic combined systolic (congestive) and diastolic (congestive) heart failure: Secondary | ICD-10-CM

## 2018-02-08 DIAGNOSIS — I2510 Atherosclerotic heart disease of native coronary artery without angina pectoris: Secondary | ICD-10-CM

## 2018-02-08 HISTORY — DX: Nonrheumatic aortic (valve) stenosis: I35.0

## 2018-02-08 LAB — BASIC METABOLIC PANEL
ANION GAP: 8 (ref 5–15)
BUN: 18 mg/dL (ref 8–23)
CALCIUM: 9 mg/dL (ref 8.9–10.3)
CO2: 21 mmol/L — ABNORMAL LOW (ref 22–32)
Chloride: 105 mmol/L (ref 98–111)
Creatinine, Ser: 0.99 mg/dL (ref 0.44–1.00)
GFR, EST AFRICAN AMERICAN: 59 mL/min — AB (ref >60)
GFR, EST NON AFRICAN AMERICAN: 51 mL/min — AB (ref >60)
GLUCOSE: 259 mg/dL — AB (ref 70–99)
POTASSIUM: 4.2 mmol/L (ref 3.5–5.1)
SODIUM: 134 mmol/L — AB (ref 135–145)

## 2018-02-08 LAB — CBC
HCT: 40.9 % (ref 36.0–46.0)
Hemoglobin: 12.9 g/dL (ref 12.0–15.0)
MCH: 26.4 pg (ref 26.0–34.0)
MCHC: 31.5 g/dL (ref 30.0–36.0)
MCV: 83.6 fL (ref 80.0–100.0)
NRBC: 0 % (ref 0.0–0.2)
PLATELETS: 223 10*3/uL (ref 150–400)
RBC: 4.89 MIL/uL (ref 3.87–5.11)
RDW: 14.6 % (ref 11.5–15.5)
WBC: 9 10*3/uL (ref 4.0–10.5)

## 2018-02-08 LAB — GLUCOSE, CAPILLARY
GLUCOSE-CAPILLARY: 146 mg/dL — AB (ref 70–99)
GLUCOSE-CAPILLARY: 243 mg/dL — AB (ref 70–99)
GLUCOSE-CAPILLARY: 139 mg/dL — AB (ref 70–99)
Glucose-Capillary: 105 mg/dL — ABNORMAL HIGH (ref 70–99)

## 2018-02-08 LAB — HEPARIN LEVEL (UNFRACTIONATED)
HEPARIN UNFRACTIONATED: 0.26 [IU]/mL — AB (ref 0.30–0.70)
Heparin Unfractionated: 0.14 IU/mL — ABNORMAL LOW (ref 0.30–0.70)

## 2018-02-08 MED ORDER — IOPAMIDOL (ISOVUE-370) INJECTION 76%
INTRAVENOUS | Status: AC
  Filled 2018-02-08: qty 100

## 2018-02-08 MED ORDER — IOPAMIDOL (ISOVUE-370) INJECTION 76%
100.0000 mL | Freq: Once | INTRAVENOUS | Status: AC | PRN
  Administered 2018-02-08: 100 mL via INTRAVENOUS

## 2018-02-08 MED ORDER — CLOPIDOGREL BISULFATE 300 MG PO TABS
300.0000 mg | ORAL_TABLET | Freq: Once | ORAL | Status: AC
  Administered 2018-02-08: 300 mg via ORAL
  Filled 2018-02-08: qty 1

## 2018-02-08 MED ORDER — CLOPIDOGREL BISULFATE 75 MG PO TABS
75.0000 mg | ORAL_TABLET | Freq: Every day | ORAL | Status: DC
  Administered 2018-02-09 – 2018-02-15 (×8): 75 mg via ORAL
  Filled 2018-02-08 (×7): qty 1

## 2018-02-08 NOTE — Progress Notes (Signed)
DAILY PROGRESS NOTE   Patient Name: [name] Date of Encounter: 02/08/2018  Chief Complaint   Mild chest tightness overnight  Patient Profile   84 yo female with acute on chronic systolic CHF - echo 2 weeks ago showed EF 55-60%, now ~40%, severe 95% distal LM, 90% ostial LAD, 80% proximal Lcx and occluded RCA - there is moderate to severe aortic stenosis with gradient of 16 mmHg and AVA of 0.95 cm2.  Subjective   Mild chest tightness overnight - looks like she had brief PAF, but is in sinus today. Appreciate CT surgery consult, but not considered AVR/CABG candidate. Also likely "prohibitive" risk for LM PCI given moderate to severe AS and inability to provide hemodynamic support (impella) during the case.  Objective   Vitals:   02/08/18 0300 02/08/18 0357 02/08/18 0600 02/08/18 0731  BP: (!) 110/54 (!) 95/54 114/63   Pulse:  60    Resp: 19 17 (!) 21   Temp:  97.6 F (36.4 C)  97.6 F (36.4 C)  TempSrc:  Oral  Oral  SpO2: 94% 93% 96%   Weight:      Height:        Intake/Output Summary (Last 24 hours) at 02/08/2018 2130 Last data filed at 02/08/2018 0700 Gross per 24 hour  Intake 504.61 ml  Output -  Net 504.61 ml   Filed Weights   02/07/18 0906 02/07/18 1313  Weight: 65.8 kg 65.8 kg    Physical Exam   General appearance: alert and no distress Neck: no carotid bruit, no JVD and thyroid not enlarged, symmetric, no tenderness/mass/nodules Lungs: diminished breath sounds bibasilar Heart: regular rate and rhythm, S1: normal, S2: decreased intensity and systolic murmur: late systolic 3/6, crescendo at 2nd right intercostal space Abdomen: soft, non-tender; bowel sounds normal; no masses,  no organomegaly Extremities: extremities normal, atraumatic, no cyanosis or edema Pulses: 2+ and symmetric Skin: Skin color, texture, turgor normal. No rashes or lesions Neurologic: Grossly normal Psych: Pleasant  Inpatient Medications    Scheduled Meds: . aspirin EC   81 mg Oral Daily  . atorvastatin  40 mg Oral q1800  . carvedilol  6.25 mg Oral BID WC  . furosemide  40 mg Oral Daily  . insulin aspart  0-15 Units Subcutaneous TID WC  . insulin aspart  4 Units Subcutaneous TID WC  . losartan  25 mg Oral Daily  . pantoprazole  40 mg Oral Daily  . potassium chloride  10 mEq Oral Daily  . sodium chloride flush  3 mL Intravenous Q12H    Continuous Infusions: . sodium chloride    . heparin 900 Units/hr (02/08/18 0700)    PRN Meds: sodium chloride, acetaminophen, acetaminophen, ondansetron (ZOFRAN) IV, sodium chloride flush   Labs   Results for orders placed or performed during the hospital encounter of 02/07/18 (from the past 48 hour(s))  Glucose, capillary     Status: Abnormal   Collection Time: 02/07/18  9:10 AM  Result Value Ref Range   Glucose-Capillary 163 (H) 70 - 99 mg/dL  I-STAT 3, venous blood gas (G3P V)     Status: Abnormal   Collection Time: 02/07/18 11:54 AM  Result Value Ref Range   pH, Ven 7.371 7.250 - 7.430   pCO2, Ven 38.2 (L) 44.0 - 60.0 mmHg   pO2, Ven 28.0 (LL) 32.0 - 45.0 mmHg   Bicarbonate 22.1 20.0 - 28.0 mmol/L   TCO2 23 22 - 32 mmol/L   O2 Saturation 52.0 %  Acid-base deficit 3.0 (H) 0.0 - 2.0 mmol/L   Patient temperature HIDE    Sample type VENOUS    Comment NOTIFIED PHYSICIAN   I-STAT 3, arterial blood gas (G3+)     Status: Abnormal   Collection Time: 02/07/18 11:54 AM  Result Value Ref Range   pH, Arterial 7.435 7.350 - 7.450   pCO2 arterial 30.3 (L) 32.0 - 48.0 mmHg   pO2, Arterial 70.0 (L) 83.0 - 108.0 mmHg   Bicarbonate 20.3 20.0 - 28.0 mmol/L   TCO2 21 (L) 22 - 32 mmol/L   O2 Saturation 95.0 %   Acid-base deficit 3.0 (H) 0.0 - 2.0 mmol/L   Patient temperature HIDE    Sample type ARTERIAL   I-STAT 3, venous blood gas (G3P V)     Status: Abnormal   Collection Time: 02/07/18 11:58 AM  Result Value Ref Range   pH, Ven 7.387 7.250 - 7.430   pCO2, Ven 40.4 (L) 44.0 - 60.0 mmHg   pO2, Ven 30.0 (LL) 32.0  - 45.0 mmHg   Bicarbonate 24.3 20.0 - 28.0 mmol/L   TCO2 25 22 - 32 mmol/L   O2 Saturation 58.0 %   Acid-base deficit 1.0 0.0 - 2.0 mmol/L   Patient temperature HIDE    Sample type VENOUS    Comment NOTIFIED PHYSICIAN   MRSA PCR Screening     Status: None   Collection Time: 02/07/18  1:03 PM  Result Value Ref Range   MRSA by PCR NEGATIVE NEGATIVE    Comment:        The GeneXpert MRSA Assay (FDA approved for NASAL specimens only), is one component of a comprehensive MRSA colonization surveillance program. It is not intended to diagnose MRSA infection nor to guide or monitor treatment for MRSA infections. Performed at [facility], [place] [address] [place], [place] [address]   Glucose, capillary     Status: Abnormal   Collection Time: 02/07/18  1:04 PM  Result Value Ref Range   Glucose-Capillary 160 (H) 70 - 99 mg/dL  Glucose, capillary     Status: Abnormal   Collection Time: 02/07/18  4:38 PM  Result Value Ref Range   Glucose-Capillary 158 (H) 70 - 99 mg/dL  Glucose, capillary     Status: Abnormal   Collection Time: 02/07/18  8:33 PM  Result Value Ref Range   Glucose-Capillary 121 (H) 70 - 99 mg/dL  Heparin level (unfractionated)     Status: Abnormal   Collection Time: 02/08/18  2:45 AM  Result Value Ref Range   Heparin Unfractionated 0.14 (L) 0.30 - 0.70 IU/mL    Comment: (NOTE) If heparin results are below expected values, and patient dosage has  been confirmed, suggest follow up testing of antithrombin III levels. Performed at [facility], [place] [address], [place], [place] [address]   Glucose, capillary     Status: Abnormal   Collection Time: 02/08/18  8:18 AM  Result Value Ref Range   Glucose-Capillary 146 (H) 70 - 99 mg/dL    ECG   N/A  Telemetry   PAF overnight on telemetry, but in sinus this am - Personally Reviewed  Radiology    No results found.  Cardiac Studies   RIGHT/LEFT HEART CATH AND CORONARY ANGIOGRAPHY  Conclusion      Ost LM to Dist LM lesion is 95% stenosed.  Ost LAD lesion is 90% stenosed.  Ost Cx to Prox Cx lesion is 80% stenosed.  Prox RCA lesion is 100% stenosed.  There is moderate left ventricular systolic dysfunction.  LV end diastolic pressure is moderately elevated.  The left ventricular ejection fraction is 35-45% by visual estimate.  Ost 2nd Mrg to 2nd Mrg lesion is 50% stenosed.   1.  Critical stenosis of the distal left mainstem extending into the proximal LAD and circumflex 2.  Chronic total occlusion of the RCA collateralized by right to right and left to right collaterals 3.  Moderate segmental LV systolic dysfunction 4.  Moderate aortic stenosis with mean transvalvular gradient 16 mmHg and calculated aortic valve area 0.95 cm 5.  Reduced cardiac output by Fick measurement  Recommend: Hospital admission, IV heparin, consider revascularization options of multivessel CABG versus hemodynamically supported PCI.  PCI would likely come with prohibitive risk if the patient cannot be hemodynamically supported.  Even with support her risk will be increased with heavy calcification and need to perform atherectomy of the left mainstem in the presence of an occluded RCA.  Discussed at length with the patient and her son. Appreciate cardiac surgical consultation.   Assessment   Principal Problem:   Acute on chronic combined systolic and diastolic CHF (congestive heart failure) (HCC) Active Problems:   Left main coronary artery disease   PAF (paroxysmal atrial fibrillation) ([place])   Aortic stenosis   Plan   Ms. Hartwell has multivessel CAD and critical LM stenosis - she had chest pain overnight. On IV heparin. Does not appear to be a surgical candidate based on Dr. Everrett Coombe note: "However her multiple comorbidities prolonged recovery from hip fracture and overall functional status makes her very poor candidate to consider for coronary artery bypass grafting and aortic valve  replacement.  With her comorbidities and fragile state I suspect she could successfully have aortic valve replaced and coronary artery bypass grafting, getting out of the OR but likely to do poorly in recovery." According to Dr. Burt Knack, "PCI would likely come with prohibitive risk if the patient cannot be hemodynamically supported.  Even with support her risk will be increased with heavy calcification and need to perform atherectomy of the left mainstem in the presence of an occluded RCA."  I do not think she has interventional options at this point. Will reach out to Dr. Burt Knack for his advice. She appears euvolemic on exam. Noted also to have brief PAF overnight, but back in sinus today. CHADSVASC score of 5. May need to start DAPT if not an interventional candidate and consider palliative care evaluation for GOC evaluation.  Time Spent Directly with Patient:  I have spent a total of 35 minutes with the patient reviewing hospital notes, telemetry, EKGs, labs and examining the patient as well as establishing an assessment and plan that was discussed personally with the patient.  > 50% of time was spent in direct patient care. This patient is complex, critically ill and requires high complexity decision making.  Length of Stay:  LOS: 1 day   Pixie Casino, MD, [facility], [place], [place] Director of the Advanced Lipid Disorders &  Cardiovascular Risk Reduction Clinic Diplomate of the American Board of Clinical Lipidology Attending Cardiologist  Direct Dial: [phone]  Fax: [phone]  Website:  [email or web address] 02/08/2018, 9:24 AM

## 2018-02-08 NOTE — Consult Note (Addendum)
   [facility] CM Inpatient Consult   02/08/2018  [name] 08-Feb-1930 [id number]   Patient with a HX of [facility] Management outreach in the past for services. At that time patient and daughter withdrew from program follow up due to feeling home health at that time was sufficient. Patient currently has a low risk for unplanned readmission score and according to record in Epic this is her first admission in the past 6 months. Chart review of History and Physicial reveals per MD notes:  [name] is a 84 y.o. female with a hx of CHF, PAD, diabetes, and aortic stenosis.  History of NSTEMI  now for possible PCI.  Her primary care provider is Dr. Daleen Squibb and this provider's office provides the transition of care follow up.    Please place a [facility] Care Management consult if needs arises or for questions contact:   Natividad Brood, RN BSN [facility] Liaison  [phone] business mobile phone Toll free office [phone]

## 2018-02-08 NOTE — Progress Notes (Signed)
Case discussed with Dr. Burt Knack and Angelena Form. Dr. Burt Knack is willing to proceed with high risk PCI, but is concerned about vascular access options. Will order CTA abdomen/pelvis today to further evaluate. Plan tentative PCI next Tuesday with Dr. Burt Knack. Will need to remain hospitalized on heparin until that time. Will load with Plavix, in case she has recurrent PAF and needs triple therapy.  Pixie Casino, MD, [facility], [place] Director of the Advanced Lipid Disorders &  Cardiovascular Risk Reduction Clinic Diplomate of the American Board of Clinical Lipidology Attending Cardiologist  Direct Dial: [phone]  Fax: [phone]  Website:  [email or web address]

## 2018-02-08 NOTE — Progress Notes (Signed)
ANTICOAGULATION CONSULT NOTE - Follow Up Consult  Pharmacy Consult for heparin Indication: CAD   Labs: Recent Labs    02/08/18 0245  HEPARINUNFRC 0.14*    Assessment: 84yo female subtherapeutic on heparin with initial dosing post-cath; no gtt issues or signs of bleeding per RN.  Goal of Therapy:  Heparin level 0.3-0.7 units/ml   Plan:  Will increase heparin gtt by 2-3 units/kg/hr to 900 units/hr and check level in 8 hours.    Wynona Neat, PharmD, BCPS  02/08/2018,3:59 AM

## 2018-02-08 NOTE — Progress Notes (Signed)
[facility] for Heparin Indication: chest pain/ACS s/p Cath plan CABG  Allergies  Allergen Reactions  . Metformin     unspecified  . Penicillins     unspecified Has patient had a PCN reaction causing immediate rash, facial/tongue/throat swelling, SOB or lightheadedness with hypotension: Unknown Has patient had a PCN reaction causing severe rash involving mucus membranes or skin necrosis: Unknown Has patient had a PCN reaction that required hospitalization: No Has patient had a PCN reaction occurring within the last 10 years: No If all of the above answers are "NO", then may proceed with Cephalosporin use.   . Codeine Palpitations    Rapid heart rate  . Fenofibrate Palpitations  . Pioglitazone Rash    Patient Measurements: Height: 5\' 3"  (160 cm) Weight: 145 lb 1 oz (65.8 kg) IBW/kg (Calculated) : 52.4   Vital Signs: Temp: 98.9 F (37.2 C) (10/31 1158) Temp Source: Oral (10/31 1158) BP: 103/63 (10/31 1158) Pulse Rate: 75 (10/31 1158)  Labs: Recent Labs    02/08/18 0245 02/08/18 1209  HGB  --  12.9  HCT  --  40.9  PLT  --  223  HEPARINUNFRC 0.14* 0.26*  CREATININE  --  0.99    Estimated Creatinine Clearance: 37.9 mL/min (by C-G formula based on SCr of 0.99 mg/dL).   Medical History: Past Medical History:  Diagnosis Date  . Anxiety   . Diaphragmatic hernia without mention of obstruction or gangrene   . Diverticulosis of colon (without mention of hemorrhage) 2009  . Fibromyalgia   . GERD (gastroesophageal reflux disease)   . Hiatal hernia 2009   s/p nissen fundoplication 4709.    Marland Kitchen Hx of adenomatous colonic polyps 2007, 2009   Adenomatous polyps in 2007 and 2009.  Hyperplastic polyps in 2009  . Hyperlipidemia   . IBS (irritable bowel syndrome)   . Internal hemorrhoids without mention of complication 6283   Seen at colonoscopy.  . Thyroid disease   . Type II or unspecified type diabetes mellitus with neurological  manifestations, not stated as uncontrolled(250.60)     Assessment: 87yof admitted with CP take to cath lab found to have multivessel CAD - plan PCI.    Heparin level came back subtherapeutic at 0.26, on 900 units/hr. Hgb 12.9, plt 223. No s/sx of bleeding. No infusion issues documented- IV line is positional.   Goal of Therapy:  Heparin level 0.3-0.7 units/ml Monitor platelets by anticoagulation protocol: Yes   Plan:  Increase heparin infusion to 1000 units/hr Daily HL, CBC, monitor s/sx of bleeding F/u plans for possible PCI   Doylene Canard, PharmD Clinical Pharmacist  Pager: [phone] Phone: [phone] 02/08/2018 1:27 PM

## 2018-02-09 DIAGNOSIS — I25110 Atherosclerotic heart disease of native coronary artery with unstable angina pectoris: Principal | ICD-10-CM

## 2018-02-09 LAB — GLUCOSE, CAPILLARY
GLUCOSE-CAPILLARY: 120 mg/dL — AB (ref 70–99)
Glucose-Capillary: 172 mg/dL — ABNORMAL HIGH (ref 70–99)
Glucose-Capillary: 166 mg/dL — ABNORMAL HIGH (ref 70–99)
Glucose-Capillary: 207 mg/dL — ABNORMAL HIGH (ref 70–99)

## 2018-02-09 LAB — CBC
HCT: 39.9 % (ref 36.0–46.0)
Hemoglobin: 12.3 g/dL (ref 12.0–15.0)
MCH: 26.1 pg (ref 26.0–34.0)
MCHC: 30.8 g/dL (ref 30.0–36.0)
MCV: 84.5 fL (ref 80.0–100.0)
NRBC: 0 % (ref 0.0–0.2)
PLATELETS: 214 10*3/uL (ref 150–400)
RBC: 4.72 MIL/uL (ref 3.87–5.11)
RDW: 14.6 % (ref 11.5–15.5)
WBC: 11.3 10*3/uL — AB (ref 4.0–10.5)

## 2018-02-09 LAB — HEPARIN LEVEL (UNFRACTIONATED): Heparin Unfractionated: 0.48 IU/mL (ref 0.30–0.70)

## 2018-02-09 NOTE — Progress Notes (Signed)
[facility] for Heparin Indication: chest pain/ACS s/p Cath plan CABG  Allergies  Allergen Reactions  . Metformin     unspecified  . Penicillins     unspecified Has patient had a PCN reaction causing immediate rash, facial/tongue/throat swelling, SOB or lightheadedness with hypotension: Unknown Has patient had a PCN reaction causing severe rash involving mucus membranes or skin necrosis: Unknown Has patient had a PCN reaction that required hospitalization: No Has patient had a PCN reaction occurring within the last 10 years: No If all of the above answers are "NO", then may proceed with Cephalosporin use.   . Codeine Palpitations    Rapid heart rate  . Fenofibrate Palpitations  . Pioglitazone Rash    Patient Measurements: Height: 5\' 3"  (160 cm) Weight: 145 lb 1 oz (65.8 kg) IBW/kg (Calculated) : 52.4   Vital Signs: Temp: 96.7 F (35.9 C) (11/01 0338) Temp Source: Axillary (11/01 0338) BP: 99/51 (11/01 0338)  Labs: Recent Labs    02/08/18 0245 02/08/18 1209 02/09/18 0212  HGB  --  12.9 12.3  HCT  --  40.9 39.9  PLT  --  223 214  HEPARINUNFRC 0.14* 0.26* 0.48  CREATININE  --  0.99  --     Estimated Creatinine Clearance: 37.9 mL/min (by C-G formula based on SCr of 0.99 mg/dL).   Medical History: Past Medical History:  Diagnosis Date  . Anxiety   . Diaphragmatic hernia without mention of obstruction or gangrene   . Diverticulosis of colon (without mention of hemorrhage) 2009  . Fibromyalgia   . GERD (gastroesophageal reflux disease)   . Hiatal hernia 2009   s/p nissen fundoplication 2549.    Marland Kitchen Hx of adenomatous colonic polyps 2007, 2009   Adenomatous polyps in 2007 and 2009.  Hyperplastic polyps in 2009  . Hyperlipidemia   . IBS (irritable bowel syndrome)   . Internal hemorrhoids without mention of complication 8264   Seen at colonoscopy.  . Thyroid disease   . Type II or unspecified type diabetes mellitus with  neurological manifestations, not stated as uncontrolled(250.60)     Assessment: 87yof admitted with CP take to cath lab found to have multivessel CAD - plan PCI.    Heparin level came back therapeutic at 0.48, on 1000 units/hr. Hgb 12.3, plt 214. No s/sx of bleeding. No infusion issues.    Goal of Therapy:  Heparin level 0.3-0.7 units/ml Monitor platelets by anticoagulation protocol: Yes   Plan:  Continue heparin infusion to 1000 units/hr Daily HL, CBC, monitor s/sx of bleeding F/u plans for possible PCI - currently listed for next Tuesday  Doylene Canard, PharmD Clinical Pharmacist  Pager: [phone] Phone: [phone] 02/09/2018 7:29 AM

## 2018-02-09 NOTE — Progress Notes (Signed)
Progress Note  Patient Name: [name] Date of Encounter: 02/09/2018  Primary Cardiologist: No primary care provider on file. [place]  Subjective   No chest pain  Inpatient Medications    Scheduled Meds: . aspirin EC  81 mg Oral Daily  . atorvastatin  40 mg Oral q1800  . carvedilol  6.25 mg Oral BID WC  . clopidogrel  75 mg Oral Daily  . furosemide  40 mg Oral Daily  . insulin aspart  0-15 Units Subcutaneous TID WC  . insulin aspart  4 Units Subcutaneous TID WC  . losartan  25 mg Oral Daily  . pantoprazole  40 mg Oral Daily  . potassium chloride  10 mEq Oral Daily  . sodium chloride flush  3 mL Intravenous Q12H   Continuous Infusions: . sodium chloride    . heparin 1,000 Units/hr (02/09/18 0020)   PRN Meds: sodium chloride, acetaminophen, acetaminophen, ondansetron (ZOFRAN) IV, sodium chloride flush   Vital Signs    Vitals:   02/09/18 0029 02/09/18 0338 02/09/18 0400 02/09/18 0745  BP:  (!) 99/51  119/72  Pulse:      Resp: 18 13 20 16   Temp:  (!) 96.7 F (35.9 C)  97.8 F (36.6 C)  TempSrc:  Axillary  Oral  SpO2: 95% 98% 95% 95%  Weight:      Height:        Intake/Output Summary (Last 24 hours) at 02/09/2018 0938 Last data filed at 02/09/2018 0800 Gross per 24 hour  Intake 244.14 ml  Output 1200 ml  Net -955.86 ml   Filed Weights   02/07/18 0906 02/07/18 1313  Weight: 65.8 kg 65.8 kg    Telemetry    sinus - Personally Reviewed  ECG    No am ekg - Personally Reviewed  Physical Exam   GEN: No acute distress.   Neck: No JVD Cardiac: RRR, no murmurs, rubs, or gallops.  Respiratory: Clear to auscultation bilaterally. GI: Soft, nontender, non-distended  MS: No edema; No deformity. Neuro:  Nonfocal  Psych: Normal affect   Labs    Chemistry Recent Labs  Lab 02/08/18 1209  NA 134*  K 4.2  CL 105  CO2 21*  GLUCOSE 259*  BUN 18  CREATININE 0.99  CALCIUM 9.0  GFRNONAA 51*  GFRAA 59*  ANIONGAP 8     Hematology Recent Labs    Lab 02/08/18 1209 02/09/18 0212  WBC 9.0 11.3*  RBC 4.89 4.72  HGB 12.9 12.3  HCT 40.9 39.9  MCV 83.6 84.5  MCH 26.4 26.1  MCHC 31.5 30.8  RDW 14.6 14.6  PLT 223 214    Cardiac EnzymesNo results for input(s): TROPONINI in the last 168 hours. No results for input(s): TROPIPOC in the last 168 hours.   BNPNo results for input(s): BNP, PROBNP in the last 168 hours.   DDimer No results for input(s): DDIMER in the last 168 hours.   Radiology    Ct Angio Abd/pel W/ And/or W/o  Result Date: 02/08/2018 CLINICAL DATA:  Evaluation of arterial vasculature prior to planned coronary intervention EXAM: CT ANGIOGRAPHY ABDOMEN AND PELVIS WITH CONTRAST TECHNIQUE: Multidetector CT imaging of the abdomen and pelvis was performed using the standard protocol during bolus administration of intravenous contrast. Multiplanar reconstructed images and MIPs were obtained and reviewed to evaluate the vascular anatomy. CONTRAST:  172mL ISOVUE-370 IOPAMIDOL (ISOVUE-370) INJECTION 76% COMPARISON:  CT of the abdomen and pelvis without contrast on 03/06/2017 FINDINGS: VASCULAR Aorta: Diffuse atherosclerosis of the abdominal aorta  with both calcified and noncalcified plaque present. No evidence of abdominal aortic aneurysm or focal aortic stenosis. No dissection. Celiac: Normally patent.  Normal branch vessel anatomy. SMA: Normally patent. Renals: Single right renal artery with eccentric calcified plaque at its origin not causing significant stenosis. Two separate left renal arteries with larger superior dominant renal artery and a small lower pole accessory renal artery. IMA: Normally patent. Inflow: Bilateral common iliac arteries show diffuse calcified plaque without evidence of aneurysm or focal stenosis. Bilateral internal iliac arteries show diffuse calcified plaque. The proximal left internal iliac artery is more heavily diseased and likely nearly occluded. Distal branch vessels are open in the internal iliac  distribution bilaterally. External iliac arteries demonstrate no significant obstructive disease bilaterally or evidence of aneurysmal disease. Proximal Outflow: Bilateral common femoral arteries demonstrate heavily calcified plaque. Mid right common femoral artery demonstrates roughly 50-60% stenosis. The proximal right SFA is occluded. The proximal right profunda femoral artery is patent. The left common femoral artery shows no significant stenosis. The proximal left SFA shows heavily calcified plaque and diffuse high-grade disease. The profunda femoral artery is open. Review of the MIP images confirms the above findings. NON-VASCULAR Lower chest: Mild scarring at both lung bases. Hepatobiliary: No focal liver abnormality is seen. No gallstones, gallbladder wall thickening, or biliary dilatation. Pancreas: Unremarkable. No pancreatic ductal dilatation or surrounding inflammatory changes. Spleen: Normal in size without focal abnormality. Adrenals/Urinary Tract: Chronic dilated extrarenal pelvis bilaterally, right greater than left. There may be a component of chronic right UPJ stenosis/obstruction. The right renal pelvis appears slightly more dilated compared to the 2018 CT. No adrenal masses. The bladder is unremarkable. Stomach/Bowel: Bowel shows no evidence of obstruction, ileus or inflammation. The appendix appears normal. No free air identified. Lymphatic: No enlarged lymph nodes identified. Reproductive: Status post hysterectomy. No adnexal masses. Other: Tiny umbilical hernia containing fat. No abdominopelvic ascites. Musculoskeletal: No acute or significant osseous findings. IMPRESSION: VASCULAR 1. Abdominal aortic atherosclerosis without evidence of aneurysm or stenosis. 2. Accessory lower pole renal artery on the left. 3. Right lower extremity: No significant obstructive disease involving the common or external iliac arteries. Significant disease of the internal iliac artery. Roughly 50-60% focal stenosis  of the right common femoral artery. Occluded proximal right SFA. 4. Left lower extremity: No significant obstructive disease involving the common or external iliac arteries. Probable near-total occlusive disease of the proximal left internal iliac artery. No significant stenosis of the left common femoral artery. Diffuse high-grade stenosis of the proximal left SFA. NON-VASCULAR 1. 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 The right renal pelvis does appear slightly more dilated than on the prior CT in 2018. This may not be of any clinical significance if renal function is normal and the patient is asymptomatic. 2. Tiny umbilical hernia containing fat. Electronically Signed   By: Aletta Edouard M.D.   On: 02/08/2018 17:21    Cardiac Studies     Patient Profile     84 y.o. female with ischemic cardiomyopathy, severe multivessel CAD, moderately severe aortic stenosis, PAF, PAD admitted following outpatient cath. Recent dyspnea and chest pain c/w unstable angina. Cardiac cath 02/07/18 per Dr. Burt Knack with severe, heavily calcified distal left main stenosis involving the proximal segments of the LAD and Circumflex. LVEF=55-60% by echo 01/25/18 but appeared to be 35-45% by LV gram.   Assessment & Plan    1. CAD with unstable angina: She is admitted with unstable angina. Cardiac cath 02/07/18 with severe, heavily  calcified distal left main artery stenosis with involvement of the LAD and Circumflex. Her RCA is chronically occluded and fills from left to right collaterals. The optimal method for revascularization would be with CABG. She was seen by Dr. Servando Snare from CT surgery and not felt to be a candidate for CABG. Medical management of her CAD would likely involve palliation given the degree of disease. Dr. Burt Knack is willing to try very high risk PCI next week on Tuesday. This will require placement of an Impella for hemodynamic support and orbital atherectomy of  the left main, LAD and circumflex arteries with stenting of the left main/LAD/Circumflex. I have spoken to the patient and family about the complexity of the case and the risk of death during the procedure. She is willing to proceed.  Will continue IV heparin until the PCI Continue ASA, Plavix, beta blocker and statin.   2. Ischemic cardiomyopathy: Volume status is ok today. Continue beta blocker and ARB  3. PAF: sinus this am  4. Aortic stenosis, moderate: Follow for now  For questions or updates, please contact [place] Please consult [email or web address] for contact info under        Signed, Lauree Chandler, MD  02/09/2018, 9:38 AM

## 2018-02-09 NOTE — Plan of Care (Signed)

## 2018-02-10 LAB — CBC
HCT: 40 % (ref 36.0–46.0)
HEMOGLOBIN: 12.8 g/dL (ref 12.0–15.0)
MCH: 26.4 pg (ref 26.0–34.0)
MCHC: 32 g/dL (ref 30.0–36.0)
MCV: 82.6 fL (ref 80.0–100.0)
NRBC: 0 % (ref 0.0–0.2)
PLATELETS: 223 10*3/uL (ref 150–400)
RBC: 4.84 MIL/uL (ref 3.87–5.11)
RDW: 14.6 % (ref 11.5–15.5)
WBC: 11.4 10*3/uL — ABNORMAL HIGH (ref 4.0–10.5)

## 2018-02-10 LAB — GLUCOSE, CAPILLARY
GLUCOSE-CAPILLARY: 129 mg/dL — AB (ref 70–99)
GLUCOSE-CAPILLARY: 176 mg/dL — AB (ref 70–99)
Glucose-Capillary: 143 mg/dL — ABNORMAL HIGH (ref 70–99)
Glucose-Capillary: 176 mg/dL — ABNORMAL HIGH (ref 70–99)

## 2018-02-10 LAB — HEPARIN LEVEL (UNFRACTIONATED): HEPARIN UNFRACTIONATED: 0.61 [IU]/mL (ref 0.30–0.70)

## 2018-02-10 MED ORDER — CALCIUM CARBONATE ANTACID 500 MG PO CHEW
1.0000 | CHEWABLE_TABLET | Freq: Two times a day (BID) | ORAL | Status: DC
  Administered 2018-02-11 – 2018-02-15 (×6): 200 mg via ORAL
  Filled 2018-02-10 (×7): qty 1

## 2018-02-10 NOTE — Progress Notes (Signed)
Progress Note  Patient Name: [name] Date of Encounter: 02/10/2018  Primary Cardiologist: No primary care provider on file. [place]  Subjective   No chest pain.  Feeling well.  Inpatient Medications    Scheduled Meds: . aspirin EC  81 mg Oral Daily  . atorvastatin  40 mg Oral q1800  . carvedilol  6.25 mg Oral BID WC  . clopidogrel  75 mg Oral Daily  . furosemide  40 mg Oral Daily  . insulin aspart  0-15 Units Subcutaneous TID WC  . insulin aspart  4 Units Subcutaneous TID WC  . losartan  25 mg Oral Daily  . pantoprazole  40 mg Oral Daily  . potassium chloride  10 mEq Oral Daily  . sodium chloride flush  3 mL Intravenous Q12H   Continuous Infusions: . sodium chloride    . heparin 1,000 Units/hr (02/10/18 0444)   PRN Meds: sodium chloride, acetaminophen, acetaminophen, ondansetron (ZOFRAN) IV, sodium chloride flush   Vital Signs    Vitals:   02/09/18 2054 02/09/18 2352 02/10/18 0442 02/10/18 0744  BP: 126/65 95/70 132/62 106/63  Pulse:  70    Resp: 13 16 14 14   Temp: (!) 97.5 F (36.4 C) (!) 97.5 F (36.4 C) 98.5 F (36.9 C) 97.6 F (36.4 C)  TempSrc: Oral Oral Oral Oral  SpO2:  94% 94% 96%  Weight:      Height:        Intake/Output Summary (Last 24 hours) at 02/10/2018 1106 Last data filed at 02/10/2018 0400 Gross per 24 hour  Intake 765 ml  Output 200 ml  Net 565 ml   Filed Weights   02/07/18 0906 02/07/18 1313  Weight: 65.8 kg 65.8 kg    Telemetry    Sinus rhythm- Personally Reviewed  ECG    No am ekg - Personally Reviewed  Physical Exam   GEN: Well nourished, well developed, in no acute distress  HEENT: normal  Neck: no JVD, carotid bruits, or masses Cardiac: RRR; 2/6 systolic murmur, no rubs, or gallops,no edema  Respiratory:  clear to auscultation bilaterally, normal work of breathing GI: soft, nontender, nondistended, + BS MS: no deformity or atrophy  Skin: warm and dry Neuro:  Strength and sensation are intact Psych:  euthymic mood, full affect   Labs    Chemistry Recent Labs  Lab 02/08/18 1209  NA 134*  K 4.2  CL 105  CO2 21*  GLUCOSE 259*  BUN 18  CREATININE 0.99  CALCIUM 9.0  GFRNONAA 51*  GFRAA 59*  ANIONGAP 8     Hematology Recent Labs  Lab 02/08/18 1209 02/09/18 0212 02/10/18 0225  WBC 9.0 11.3* 11.4*  RBC 4.89 4.72 4.84  HGB 12.9 12.3 12.8  HCT 40.9 39.9 40.0  MCV 83.6 84.5 82.6  MCH 26.4 26.1 26.4  MCHC 31.5 30.8 32.0  RDW 14.6 14.6 14.6  PLT 223 214 223    Cardiac EnzymesNo results for input(s): TROPONINI in the last 168 hours. No results for input(s): TROPIPOC in the last 168 hours.   BNPNo results for input(s): BNP, PROBNP in the last 168 hours.   DDimer No results for input(s): DDIMER in the last 168 hours.   Radiology    Ct Angio Abd/pel W/ And/or W/o  Result Date: 02/08/2018 CLINICAL DATA:  Evaluation of arterial vasculature prior to planned coronary intervention EXAM: CT ANGIOGRAPHY ABDOMEN AND PELVIS WITH CONTRAST TECHNIQUE: Multidetector CT imaging of the abdomen and pelvis was performed using the standard protocol  during bolus administration of intravenous contrast. Multiplanar reconstructed images and MIPs were obtained and reviewed to evaluate the vascular anatomy. CONTRAST:  152mL ISOVUE-370 IOPAMIDOL (ISOVUE-370) INJECTION 76% COMPARISON:  CT of the abdomen and pelvis without contrast on 03/06/2017 FINDINGS: VASCULAR Aorta: Diffuse atherosclerosis of the abdominal aorta with both calcified and noncalcified plaque present. No evidence of abdominal aortic aneurysm or focal aortic stenosis. No dissection. Celiac: Normally patent.  Normal branch vessel anatomy. SMA: Normally patent. Renals: Single right renal artery with eccentric calcified plaque at its origin not causing significant stenosis. Two separate left renal arteries with larger superior dominant renal artery and a small lower pole accessory renal artery. IMA: Normally patent. Inflow: Bilateral common  iliac arteries show diffuse calcified plaque without evidence of aneurysm or focal stenosis. Bilateral internal iliac arteries show diffuse calcified plaque. The proximal left internal iliac artery is more heavily diseased and likely nearly occluded. Distal branch vessels are open in the internal iliac distribution bilaterally. External iliac arteries demonstrate no significant obstructive disease bilaterally or evidence of aneurysmal disease. Proximal Outflow: Bilateral common femoral arteries demonstrate heavily calcified plaque. Mid right common femoral artery demonstrates roughly 50-60% stenosis. The proximal right SFA is occluded. The proximal right profunda femoral artery is patent. The left common femoral artery shows no significant stenosis. The proximal left SFA shows heavily calcified plaque and diffuse high-grade disease. The profunda femoral artery is open. Review of the MIP images confirms the above findings. NON-VASCULAR Lower chest: Mild scarring at both lung bases. Hepatobiliary: No focal liver abnormality is seen. No gallstones, gallbladder wall thickening, or biliary dilatation. Pancreas: Unremarkable. No pancreatic ductal dilatation or surrounding inflammatory changes. Spleen: Normal in size without focal abnormality. Adrenals/Urinary Tract: Chronic dilated extrarenal pelvis bilaterally, right greater than left. There may be a component of chronic right UPJ stenosis/obstruction. The right renal pelvis appears slightly more dilated compared to the 2018 CT. No adrenal masses. The bladder is unremarkable. Stomach/Bowel: Bowel shows no evidence of obstruction, ileus or inflammation. The appendix appears normal. No free air identified. Lymphatic: No enlarged lymph nodes identified. Reproductive: Status post hysterectomy. No adnexal masses. Other: Tiny umbilical hernia containing fat. No abdominopelvic ascites. Musculoskeletal: No acute or significant osseous findings. IMPRESSION: VASCULAR 1. Abdominal  aortic atherosclerosis without evidence of aneurysm or stenosis. 2. Accessory lower pole renal artery on the left. 3. Right lower extremity: No significant obstructive disease involving the common or external iliac arteries. Significant disease of the internal iliac artery. Roughly 50-60% focal stenosis of the right common femoral artery. Occluded proximal right SFA. 4. Left lower extremity: No significant obstructive disease involving the common or external iliac arteries. Probable near-total occlusive disease of the proximal left internal iliac artery. No significant stenosis of the left common femoral artery. Diffuse high-grade stenosis of the proximal left SFA. NON-VASCULAR 1. 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 The right renal pelvis does appear slightly more dilated than on the prior CT in 2018. This may not be of any clinical significance if renal function is normal and the patient is asymptomatic. 2. Tiny umbilical hernia containing fat. Electronically Signed   By: Aletta Edouard M.D.   On: 02/08/2018 17:21    Cardiac Studies   TTE 01/25/18 - Left ventricle: The cavity size was normal. Wall thickness was   normal. Systolic function was normal. The estimated ejection   fraction was in the range of 55% to 60%. Wall motion was normal;   there were no regional wall motion  abnormalities. Left   ventricular diastolic function parameters were normal. - Aortic valve: Valve mobility was restricted. There was moderate   stenosis. Valve area (VTI): 1.69 cm^2. Valve area (Vmax): 1.01   cm^2. Valve area (Vmean): 1.72 cm^2. - Mitral valve: Calcified annulus. Valve area by pressure   half-time: 2 cm^2. Valve area by continuity equation (using LVOT   flow): 2.82 cm^2. - Left atrium: The atrium was mildly dilated.  Patient Profile     84 y.o. female with ischemic cardiomyopathy, severe multivessel CAD, moderately severe aortic stenosis, PAF, PAD  admitted following outpatient cath. Recent dyspnea and chest pain c/w unstable angina. Cardiac cath 02/07/18 per Dr. Burt Knack with severe, heavily calcified distal left main stenosis involving the proximal segments of the LAD and Circumflex. LVEF=55-60% by echo 01/25/18 but appeared to be 35-45% by LV gram.   Assessment & Plan    1. CAD with unstable angina: Cardiac cath 02/07/2018 with severe heavily calcified distal left main stenosis with involvement of the LAD and circumflex.  RCA is chronically occluded and fills with collaterals.  Has been turned down for bypass surgery.  Plan for high risk PCI on Tuesday.  Kenyatta Gloeckner require Impella placement and orbital atherectomy of the left main, LAD, and circumflex.  We Temeka Pore continue IV heparin until intervention is performed.  2. Ischemic cardiomyopathy: No signs of volume overload.  Continue beta-blocker and ARB.  3. PAF: Continue heparin.  Sinus rhythm this morning.  4. Aortic stenosis: Moderate.  Continue to follow  For questions or updates, please contact [place] Please consult [email or web address] for contact info under        Signed, Geary Rufo Meredith Leeds, MD  02/10/2018, 11:06 AM

## 2018-02-10 NOTE — Plan of Care (Signed)

## 2018-02-10 NOTE — Progress Notes (Signed)
ANTICOAGULATION CONSULT NOTE  Pharmacy Consult for Heparin Indication: chest pain/ACS s/p Cath plan complex PCI  Allergies  Allergen Reactions  . Metformin     unspecified  . Penicillins     unspecified Has patient had a PCN reaction causing immediate rash, facial/tongue/throat swelling, SOB or lightheadedness with hypotension: Unknown Has patient had a PCN reaction causing severe rash involving mucus membranes or skin necrosis: Unknown Has patient had a PCN reaction that required hospitalization: No Has patient had a PCN reaction occurring within the last 10 years: No If all of the above answers are "NO", then may proceed with Cephalosporin use.   . Codeine Palpitations    Rapid heart rate  . Fenofibrate Palpitations  . Pioglitazone Rash    Patient Measurements: Height: 5\' 3"  (160 cm) Weight: 145 lb 1 oz (65.8 kg) IBW/kg (Calculated) : 52.4   Vital Signs: Temp: 97.6 F (36.4 C) (11/02 0744) Temp Source: Oral (11/02 0744) BP: 106/63 (11/02 0744) Pulse Rate: 70 (11/01 2352)  Labs: Recent Labs    02/08/18 1209 02/09/18 0212 02/10/18 0225  HGB 12.9 12.3 12.8  HCT 40.9 39.9 40.0  PLT 223 214 223  HEPARINUNFRC 0.26* 0.48 0.61  CREATININE 0.99  --   --     Estimated Creatinine Clearance: 37.9 mL/min (by C-G formula based on SCr of 0.99 mg/dL).   Medical History: Past Medical History:  Diagnosis Date  . Anxiety   . Diaphragmatic hernia without mention of obstruction or gangrene   . Diverticulosis of colon (without mention of hemorrhage) 2009  . Fibromyalgia   . GERD (gastroesophageal reflux disease)   . Hiatal hernia 2009   s/p nissen fundoplication 3335.    Marland Kitchen Hx of adenomatous colonic polyps 2007, 2009   Adenomatous polyps in 2007 and 2009.  Hyperplastic polyps in 2009  . Hyperlipidemia   . IBS (irritable bowel syndrome)   . Internal hemorrhoids without mention of complication 4562   Seen at colonoscopy.  . Thyroid disease   . Type II or unspecified type  diabetes mellitus with neurological manifestations, not stated as uncontrolled(250.60)     Assessment: 46 yoF admitted with CP taken to cath lab found to have multivessel CAD - plan complex PCI early next week.    Heparin level therapeutic at 0.61, on 1000 units/hr. Hgb 12.8, plt 223. No s/sx of bleeding. No infusion issues.    Goal of Therapy:  Heparin level 0.3-0.7 units/ml Monitor platelets by anticoagulation protocol: Yes   Plan:  Continue heparin infusion to 1000 units/hr Daily HL, CBC, monitor s/sx of bleeding F/u plans for complex PCI - currently listed for next Tuesday   Claiborne Billings, PharmD PGY2 Cardiology Pharmacy Resident Phone [phone] Please check AMION for all Pharmacist numbers by unit 02/10/2018 9:59 AM

## 2018-02-11 LAB — CBC
HCT: 37.3 % (ref 36.0–46.0)
Hemoglobin: 12.1 g/dL (ref 12.0–15.0)
MCH: 26.9 pg (ref 26.0–34.0)
MCHC: 32.4 g/dL (ref 30.0–36.0)
MCV: 83.1 fL (ref 80.0–100.0)
PLATELETS: 210 10*3/uL (ref 150–400)
RBC: 4.49 MIL/uL (ref 3.87–5.11)
RDW: 14.7 % (ref 11.5–15.5)
WBC: 11.2 10*3/uL — ABNORMAL HIGH (ref 4.0–10.5)
nRBC: 0 % (ref 0.0–0.2)

## 2018-02-11 LAB — GLUCOSE, CAPILLARY
Glucose-Capillary: 137 mg/dL — ABNORMAL HIGH (ref 70–99)
Glucose-Capillary: 153 mg/dL — ABNORMAL HIGH (ref 70–99)
Glucose-Capillary: 140 mg/dL — ABNORMAL HIGH (ref 70–99)
Glucose-Capillary: 158 mg/dL — ABNORMAL HIGH (ref 70–99)

## 2018-02-11 LAB — HEPARIN LEVEL (UNFRACTIONATED): HEPARIN UNFRACTIONATED: 0.51 [IU]/mL (ref 0.30–0.70)

## 2018-02-11 NOTE — Progress Notes (Signed)
Progress Note  Patient Name: [name] Date of Encounter: 02/11/2018  Primary Cardiologist: No primary care provider on file. [place]  Subjective   Overall feeling well.  No chest pain or shortness of breath.  Inpatient Medications    Scheduled Meds: . aspirin EC  81 mg Oral Daily  . atorvastatin  40 mg Oral q1800  . calcium carbonate  1 tablet Oral BID  . carvedilol  6.25 mg Oral BID WC  . clopidogrel  75 mg Oral Daily  . furosemide  40 mg Oral Daily  . insulin aspart  0-15 Units Subcutaneous TID WC  . insulin aspart  4 Units Subcutaneous TID WC  . losartan  25 mg Oral Daily  . pantoprazole  40 mg Oral Daily  . potassium chloride  10 mEq Oral Daily  . sodium chloride flush  3 mL Intravenous Q12H   Continuous Infusions: . sodium chloride    . heparin 1,000 Units/hr (02/11/18 0226)   PRN Meds: sodium chloride, acetaminophen, acetaminophen, ondansetron (ZOFRAN) IV, sodium chloride flush   Vital Signs    Vitals:   02/10/18 1700 02/10/18 2345 02/11/18 0520 02/11/18 0800  BP:  104/68 (!) 105/50   Pulse:      Resp: 20 (!) 31 (!) 30 13  Temp:  98 F (36.7 C) 98.1 F (36.7 C)   TempSrc:  Oral Oral   SpO2: 94% 96% 93% 99%  Weight:      Height:        Intake/Output Summary (Last 24 hours) at 02/11/2018 1124 Last data filed at 02/11/2018 0900 Gross per 24 hour  Intake 300 ml  Output 200 ml  Net 100 ml   Filed Weights   02/07/18 0906 02/07/18 1313  Weight: 65.8 kg 65.8 kg    Telemetry    Sinus rhythm- Personally Reviewed  ECG    None new- Personally Reviewed  Physical Exam   GEN: Well nourished, well developed, in no acute distress  HEENT: normal  Neck: no JVD, carotid bruits, or masses Cardiac: RRR; no murmurs, rubs, or gallops,no edema  Respiratory:  clear to auscultation bilaterally, normal work of breathing GI: soft, nontender, nondistended, + BS MS: no deformity or atrophy  Skin: warm and dry Neuro:  Strength and sensation are  intact Psych: euthymic mood, full affect   Labs    Chemistry Recent Labs  Lab 02/08/18 1209  NA 134*  K 4.2  CL 105  CO2 21*  GLUCOSE 259*  BUN 18  CREATININE 0.99  CALCIUM 9.0  GFRNONAA 51*  GFRAA 59*  ANIONGAP 8     Hematology Recent Labs  Lab 02/09/18 0212 02/10/18 0225 02/11/18 0215  WBC 11.3* 11.4* 11.2*  RBC 4.72 4.84 4.49  HGB 12.3 12.8 12.1  HCT 39.9 40.0 37.3  MCV 84.5 82.6 83.1  MCH 26.1 26.4 26.9  MCHC 30.8 32.0 32.4  RDW 14.6 14.6 14.7  PLT 214 223 210    Cardiac EnzymesNo results for input(s): TROPONINI in the last 168 hours. No results for input(s): TROPIPOC in the last 168 hours.   BNPNo results for input(s): BNP, PROBNP in the last 168 hours.   DDimer No results for input(s): DDIMER in the last 168 hours.   Radiology    No results found.  Cardiac Studies   TTE 01/25/18 - Left ventricle: The cavity size was normal. Wall thickness was   normal. Systolic function was normal. The estimated ejection   fraction was in the range of 55%  to 60%. Wall motion was normal;   there were no regional wall motion abnormalities. Left   ventricular diastolic function parameters were normal. - Aortic valve: Valve mobility was restricted. There was moderate   stenosis. Valve area (VTI): 1.69 cm^2. Valve area (Vmax): 1.01   cm^2. Valve area (Vmean): 1.72 cm^2. - Mitral valve: Calcified annulus. Valve area by pressure   half-time: 2 cm^2. Valve area by continuity equation (using LVOT   flow): 2.82 cm^2. - Left atrium: The atrium was mildly dilated.  Patient Profile     84 y.o. female with ischemic cardiomyopathy, severe multivessel CAD, moderately severe aortic stenosis, PAF, PAD admitted following outpatient cath. Recent dyspnea and chest pain c/w unstable angina. Cardiac cath 02/07/18 per Dr. Burt Knack with severe, heavily calcified distal left main stenosis involving the proximal segments of the LAD and Circumflex. LVEF=55-60% by echo 01/25/18 but appeared  to be 35-45% by LV gram.   Assessment & Plan    1. CAD with unstable angina: Cardiac cath 02/07/2018 was severely calcified distal left main stenosis with involvement of the LAD and circumflex.  RCA is chronically occluded and fills with collaterals.  Has been turned down for bypass surgery.  Plan for high risk PCI on Tuesday which [name] require Impella placement and orbital atherectomy.  Continue IV heparin until this is performed.   2. Ischemic cardiomyopathy: No signs of volume overload.  Continue current management.  3. PAF:   Sinus rhythm this morning.  Continue heparin.  4. Aortic stenosis: Moderate, [name] continue to follow.  For questions or updates, please contact [place] Please consult [email or web address] for contact info under        Signed, [name] Caldeira Meredith Leeds, MD  02/11/2018, 11:24 AM

## 2018-02-11 NOTE — Progress Notes (Signed)
ANTICOAGULATION CONSULT NOTE  Pharmacy Consult for Heparin Indication: chest pain/ACS s/p Cath plan complex PCI  Allergies  Allergen Reactions  . Metformin     unspecified  . Penicillins     unspecified Has patient had a PCN reaction causing immediate rash, facial/tongue/throat swelling, SOB or lightheadedness with hypotension: Unknown Has patient had a PCN reaction causing severe rash involving mucus membranes or skin necrosis: Unknown Has patient had a PCN reaction that required hospitalization: No Has patient had a PCN reaction occurring within the last 10 years: No If all of the above answers are "NO", then may proceed with Cephalosporin use.   . Codeine Palpitations    Rapid heart rate  . Fenofibrate Palpitations  . Pioglitazone Rash    Patient Measurements: Height: 5\' 3"  (160 cm) Weight: 145 lb 1 oz (65.8 kg) IBW/kg (Calculated) : 52.4   Vital Signs: Temp: 98.1 F (36.7 C) (11/03 0520) Temp Source: Oral (11/03 0520) BP: 105/50 (11/03 0520)  Labs: Recent Labs    02/09/18 0212 02/10/18 0225 02/11/18 0215  HGB 12.3 12.8 12.1  HCT 39.9 40.0 37.3  PLT 214 223 210  HEPARINUNFRC 0.48 0.61 0.51    Estimated Creatinine Clearance: 37.9 mL/min (by C-G formula based on SCr of 0.99 mg/dL).   Medical History: Past Medical History:  Diagnosis Date  . Anxiety   . Diaphragmatic hernia without mention of obstruction or gangrene   . Diverticulosis of colon (without mention of hemorrhage) 2009  . Fibromyalgia   . GERD (gastroesophageal reflux disease)   . Hiatal hernia 2009   s/p nissen fundoplication 7017.    Marland Kitchen Hx of adenomatous colonic polyps 2007, 2009   Adenomatous polyps in 2007 and 2009.  Hyperplastic polyps in 2009  . Hyperlipidemia   . IBS (irritable bowel syndrome)   . Internal hemorrhoids without mention of complication 7939   Seen at colonoscopy.  . Thyroid disease   . Type II or unspecified type diabetes mellitus with neurological manifestations, not  stated as uncontrolled(250.60)     Assessment: 19 yoF admitted with CP taken to cath lab found to have multivessel CAD - plan complex PCI early next week.    Heparin level therapeutic at 0.51, on 1000 units/hr. Hgb 12.1, plt 210. No s/sx of bleeding. No infusion issues.    Goal of Therapy:  Heparin level 0.3-0.7 units/ml Monitor platelets by anticoagulation protocol: Yes   Plan:  Continue heparin infusion to 1000 units/hr Daily HL, CBC, monitor s/sx of bleeding F/u plans for complex PCI - currently listed for Tuesday   Claiborne Billings, PharmD PGY2 Cardiology Pharmacy Resident Phone [phone] Please check AMION for all Pharmacist numbers by unit 02/11/2018 11:49 AM

## 2018-02-11 NOTE — Plan of Care (Signed)

## 2018-02-12 DIAGNOSIS — I2510 Atherosclerotic heart disease of native coronary artery without angina pectoris: Secondary | ICD-10-CM

## 2018-02-12 DIAGNOSIS — I2584 Coronary atherosclerosis due to calcified coronary lesion: Secondary | ICD-10-CM

## 2018-02-12 LAB — GLUCOSE, CAPILLARY
GLUCOSE-CAPILLARY: 162 mg/dL — AB (ref 70–99)
GLUCOSE-CAPILLARY: 152 mg/dL — AB (ref 70–99)
GLUCOSE-CAPILLARY: 70 mg/dL (ref 70–99)
Glucose-Capillary: 188 mg/dL — ABNORMAL HIGH (ref 70–99)

## 2018-02-12 LAB — LIPID PANEL
Cholesterol: 137 mg/dL (ref 0–200)
HDL: 48 mg/dL (ref >40)
LDL CALC: 52 mg/dL (ref 0–99)
TRIGLYCERIDES: 187 mg/dL — AB (ref <150)
Total CHOL/HDL Ratio: 2.9 RATIO
VLDL: 37 mg/dL (ref 0–40)

## 2018-02-12 LAB — CBC
HCT: 40.9 % (ref 36.0–46.0)
Hemoglobin: 12.6 g/dL (ref 12.0–15.0)
MCH: 25.7 pg — AB (ref 26.0–34.0)
MCHC: 30.8 g/dL (ref 30.0–36.0)
MCV: 83.5 fL (ref 80.0–100.0)
Platelets: 242 10*3/uL (ref 150–400)
RBC: 4.9 MIL/uL (ref 3.87–5.11)
RDW: 14.9 % (ref 11.5–15.5)
WBC: 11.2 10*3/uL — ABNORMAL HIGH (ref 4.0–10.5)
nRBC: 0 % (ref 0.0–0.2)

## 2018-02-12 LAB — HEPARIN LEVEL (UNFRACTIONATED): Heparin Unfractionated: 0.71 IU/mL — ABNORMAL HIGH (ref 0.30–0.70)

## 2018-02-12 NOTE — Progress Notes (Addendum)
Progress Note  Patient Name: [name] Date of Encounter: 02/12/2018  Primary Cardiologist: Dr. Burt Knack   Subjective   Patient resting comfortably in chair by bedside. Without dyspnea or chest pain.   Inpatient Medications    Scheduled Meds: . aspirin EC  81 mg Oral Daily  . atorvastatin  40 mg Oral q1800  . calcium carbonate  1 tablet Oral BID  . carvedilol  6.25 mg Oral BID WC  . clopidogrel  75 mg Oral Daily  . furosemide  40 mg Oral Daily  . insulin aspart  0-15 Units Subcutaneous TID WC  . insulin aspart  4 Units Subcutaneous TID WC  . losartan  25 mg Oral Daily  . pantoprazole  40 mg Oral Daily  . potassium chloride  10 mEq Oral Daily  . sodium chloride flush  3 mL Intravenous Q12H   Continuous Infusions: . sodium chloride    . heparin 900 Units/hr (02/12/18 0845)   PRN Meds: sodium chloride, acetaminophen, acetaminophen, ondansetron (ZOFRAN) IV, sodium chloride flush   Vital Signs    Vitals:   02/11/18 2300 02/12/18 0329 02/12/18 0749 02/12/18 0752  BP: 131/64 (!) 96/56  112/60  Pulse: 71 65 67   Resp: 17 (!) 24  (!) 22  Temp: 97.9 F (36.6 C) 98 F (36.7 C)  98.1 F (36.7 C)  TempSrc: Oral Oral  Oral  SpO2: 94% 93%  93%  Weight:      Height:        Intake/Output Summary (Last 24 hours) at 02/12/2018 1055 Last data filed at 02/12/2018 1036 Gross per 24 hour  Intake 969 ml  Output 1275 ml  Net -306 ml    I/O since admission:   [facility] Weights   02/07/18 0906 02/07/18 1313  Weight: 65.8 kg 65.8 kg    Telemetry    Sinus rhythm without paf- Personally Reviewed  ECG    ECG: None on file   Physical Exam    BP 112/60 (BP Location: Left Wrist)   Pulse 67   Temp 98.1 F (36.7 C) (Oral)   Resp (!) 22   Ht 5\' 3"  (1.6 m)   Wt 65.8 kg   SpO2 93%   BMI 25.70 kg/m  Physical Exam  Constitutional: She appears well-developed and well-nourished. No distress.  HENT:  Head: Normocephalic and atraumatic.  Eyes: Conjunctivae are normal.    Cardiovascular: Normal rate, regular rhythm and normal heart sounds.  Diminished dp pulses bilaterally  Respiratory: Effort normal and breath sounds normal. No respiratory distress. She has no wheezes.  GI: Soft. Bowel sounds are normal. She exhibits no distension. There is no tenderness.  Musculoskeletal: She exhibits no edema.  Neurological: She is alert.  Skin: She is not diaphoretic. No erythema.  Psychiatric: She has a normal mood and affect. Her behavior is normal. Judgment and thought content normal.    Labs    Chemistry Recent Labs  Lab 02/08/18 1209  NA 134*  K 4.2  CL 105  CO2 21*  GLUCOSE 259*  BUN 18  CREATININE 0.99  CALCIUM 9.0  GFRNONAA 51*  GFRAA 59*  ANIONGAP 8     Hematology Recent Labs  Lab 02/10/18 0225 02/11/18 0215 02/12/18 0228  WBC 11.4* 11.2* 11.2*  RBC 4.84 4.49 4.90  HGB 12.8 12.1 12.6  HCT 40.0 37.3 40.9  MCV 82.6 83.1 83.5  MCH 26.4 26.9 25.7*  MCHC 32.0 32.4 30.8  RDW 14.6 14.7 14.9  PLT 223 210 242  Cardiac EnzymesNo results for input(s): TROPONINI in the last 168 hours. No results for input(s): TROPIPOC in the last 168 hours.   BNPNo results for input(s): BNP, PROBNP in the last 168 hours.   DDimer No results for input(s): DDIMER in the last 168 hours.   Lipid Panel     Component Value Date/Time   CHOL 248 (H) 06/23/2016 1157   TRIG (H) 06/23/2016 1157    406.0 Triglyceride is over 400; calculations on Lipids are invalid.   HDL 38.40 (L) 06/23/2016 1157   CHOLHDL 6 06/23/2016 1157   VLDL 76.6 (H) 08/17/2015 1006   LDLCALC 79 04/13/2007 0800   LDLDIRECT 101.0 06/23/2016 1157     Radiology    No results found.  Cardiac Studies   CT Angio (10/31): IMPRESSION: VASCULAR  1. Abdominal aortic atherosclerosis without evidence of aneurysm or stenosis. 2. Accessory lower pole renal artery on the left. 3. Right lower extremity: No significant obstructive disease involving the common or external iliac arteries.  Significant disease of the internal iliac artery. Roughly 50-60% focal stenosis of the right common femoral artery. Occluded proximal right SFA. 4. Left lower extremity: No significant obstructive disease involving the common or external iliac arteries. Probable near-total occlusive disease of the proximal left internal iliac artery. No significant stenosis of the left common femoral artery. Diffuse high-grade stenosis of the proximal left SFA.  NON-VASCULAR  1. 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 The right renal pelvis does appear slightly more dilated than on the prior CT in 2018. This may not be of any clinical significance if renal function is normal and the patient is asymptomatic. 2. Tiny umbilical hernia containing fat.  TTE (01/25/18): Study Conclusions  - Left ventricle: The cavity size was normal. Wall thickness was   normal. Systolic function was normal. The estimated ejection   fraction was in the range of 55% to 60%. Wall motion was normal;   there were no regional wall motion abnormalities. Left   ventricular diastolic function parameters were normal. - Aortic valve: Valve mobility was restricted. There was moderate   stenosis. Valve area (VTI): 1.69 cm^2. Valve area (Vmax): 1.01   cm^2. Valve area (Vmean): 1.72 cm^2. - Mitral valve: Calcified annulus. Valve area by pressure   half-time: 2 cm^2. Valve area by continuity equation (using LVOT   flow): 2.82 cm^2. - Left atrium: The atrium was mildly dilated.  Impressions:  - Compared to November 2018, there is substantial improvement in   left ventricular function due to recovery of anteroapical   contractility. Increase aoertic valve gradients are due to   improved LV function, not due to worsening stenosis.   Patient Profile     84 y.o. female chronic systolic heart failure, multi-vessel cad, diabetes, pad, aortic stenosis with recent dyspnea, fatigue and  chest discomfort with activity. Outpatient cardiac performed 10/30 showing 95% stenosis ost lm to dist lm, 100% stenosed prox rca, 90% stenosed ost lad, 80% stensosed ost cx to prox cx. Patient was hospitalized for treatment of severe multivessel stenosis.   Assessment & Plan    1. CAD with unstable angina: Was deemed poor candidate for CABG and aortic valve replacement due to comorbidities. Dr. Burt Knack plans to perform high risk pci on 02/13/18 with impella pump placement and an orbital atherectomy of the left main and circumflex. Ct angio performed on 10/31 to help guide vascular access.   Continue IV heparin (level 0.71), aspirin 81mg , plavix 75mg  qd, carvedilol 6.25mg   bid, losartan 25mg  qd, atorvastatin 40mg  qd.  2. Ischemic cardiomyopathy: LVEF improved to 55-60% from 20-25% on prior echo in Nov 2018. Continue carvedilol 6.25mg  bid, losartan 25mg  qd  3. PAF: CHADSVASC 5, sinus rhythm noted. continue carvedilol 6.25mg  bid  4. Aortic Stenosis: Patient not candidate for aortic valve replacement   Lars Mage, MD Internal Medicine PGY2 NHAFB:[phone] 02/12/2018, 1:27 PM    Patient seen and examined.  I reviewed the angiographic images of the patient's diagnostic catheterization of February 07, 2018 as well as discussed her case with Dr. Burt Knack.  I also discussed the planned procedure to be done tomorrow with Dr. Burt Knack which will require an Impella for hemodynamic support prior to any attempt at complex intervention with the atherectomy involving the left main, LAD, and ostial circumflex and bifurcation stenting of the LAD and circumflex vessels.  If an Impella cannot be inserted, the procedure may need to be aborted.  Presently, the patient is pain-free on heparin drip.  No ECG has been done since January 17, 2018 and I will repeat an ECG today.  She has moderate aortic valve stenosis and has a mean gradient of 27 and peak instantaneous gradient of 48 mm on echo.  Valve area was 1.01 cm.   Presently she is without anginal symptoms.  Lungs are relatively clear and she appears euvolemic.  Creatinine is 0.99.  We will obtain fasting lipid panel to reassess her significant mixed hyperlipidemia which has not been assessed over the past 18 months.  I answered the daughter's many questions.  They are aware of the complexity of the procedure including risk benefits and accept this risk.   Troy Sine, MD, [facility] 02/12/2018 1:51 PM

## 2018-02-12 NOTE — Progress Notes (Signed)
ANTICOAGULATION CONSULT NOTE  Pharmacy Consult for Heparin Indication: chest pain/ACS s/p Cath plan complex PCI  Allergies  Allergen Reactions  . Metformin     unspecified  . Penicillins     unspecified Has patient had a PCN reaction causing immediate rash, facial/tongue/throat swelling, SOB or lightheadedness with hypotension: Unknown Has patient had a PCN reaction causing severe rash involving mucus membranes or skin necrosis: Unknown Has patient had a PCN reaction that required hospitalization: No Has patient had a PCN reaction occurring within the last 10 years: No If all of the above answers are "NO", then may proceed with Cephalosporin use.   . Codeine Palpitations    Rapid heart rate  . Fenofibrate Palpitations  . Pioglitazone Rash    Patient Measurements: Height: 5\' 3"  (160 cm) Weight: 145 lb 1 oz (65.8 kg) IBW/kg (Calculated) : 52.4   Vital Signs: Temp: 98.1 F (36.7 C) (11/04 0752) Temp Source: Oral (11/04 0752) BP: 112/60 (11/04 0752) Pulse Rate: 67 (11/04 0749)  Labs: Recent Labs    02/10/18 0225 02/11/18 0215 02/12/18 0228  HGB 12.8 12.1 12.6  HCT 40.0 37.3 40.9  PLT 223 210 242  HEPARINUNFRC 0.61 0.51 0.71*    Estimated Creatinine Clearance: 37.9 mL/min (by C-G formula based on SCr of 0.99 mg/dL).   Medical History: Past Medical History:  Diagnosis Date  . Anxiety   . Diaphragmatic hernia without mention of obstruction or gangrene   . Diverticulosis of colon (without mention of hemorrhage) 2009  . Fibromyalgia   . GERD (gastroesophageal reflux disease)   . Hiatal hernia 2009   s/p nissen fundoplication 1660.    Marland Kitchen Hx of adenomatous colonic polyps 2007, 2009   Adenomatous polyps in 2007 and 2009.  Hyperplastic polyps in 2009  . Hyperlipidemia   . IBS (irritable bowel syndrome)   . Internal hemorrhoids without mention of complication 6301   Seen at colonoscopy.  . Thyroid disease   . Type II or unspecified type diabetes mellitus with  neurological manifestations, not stated as uncontrolled(250.60)     Assessment: 58 yoF admitted with CP taken to cath lab found to have multivessel CAD - plan complex PCI early next week.    Heparin level slightly supratherapeutic at 0.71, on 1000 units/hr. Hgb 12.6, plt 242. No s/sx of bleeding. No infusion issues.    Goal of Therapy:  Heparin level 0.3-0.7 units/ml Monitor platelets by anticoagulation protocol: Yes   Plan:  Decrease heparin infusion to 900 units/hr Daily HL, CBC, monitor s/sx of bleeding F/u plans for complex PCI - currently listed for Tuesday  Doylene Canard, PharmD Clinical Pharmacist  Pager: [phone] Phone: [phone] Please check AMION for all Pharmacist numbers by unit 02/12/2018 8:41 AM

## 2018-02-12 NOTE — H&P (View-Only) (Signed)
Progress Note  Patient Name: [name] Date of Encounter: 02/12/2018  Primary Cardiologist: Dr. Burt Knack   Subjective   Patient resting comfortably in chair by bedside. Without dyspnea or chest pain.   Inpatient Medications    Scheduled Meds: . aspirin EC  81 mg Oral Daily  . atorvastatin  40 mg Oral q1800  . calcium carbonate  1 tablet Oral BID  . carvedilol  6.25 mg Oral BID WC  . clopidogrel  75 mg Oral Daily  . furosemide  40 mg Oral Daily  . insulin aspart  0-15 Units Subcutaneous TID WC  . insulin aspart  4 Units Subcutaneous TID WC  . losartan  25 mg Oral Daily  . pantoprazole  40 mg Oral Daily  . potassium chloride  10 mEq Oral Daily  . sodium chloride flush  3 mL Intravenous Q12H   Continuous Infusions: . sodium chloride    . heparin 900 Units/hr (02/12/18 0845)   PRN Meds: sodium chloride, acetaminophen, acetaminophen, ondansetron (ZOFRAN) IV, sodium chloride flush   Vital Signs    Vitals:   02/11/18 2300 02/12/18 0329 02/12/18 0749 02/12/18 0752  BP: 131/64 (!) 96/56  112/60  Pulse: 71 65 67   Resp: 17 (!) 24  (!) 22  Temp: 97.9 F (36.6 C) 98 F (36.7 C)  98.1 F (36.7 C)  TempSrc: Oral Oral  Oral  SpO2: 94% 93%  93%  Weight:      Height:        Intake/Output Summary (Last 24 hours) at 02/12/2018 1055 Last data filed at 02/12/2018 1036 Gross per 24 hour  Intake 969 ml  Output 1275 ml  Net -306 ml    I/O since admission:   [facility] Weights   02/07/18 0906 02/07/18 1313  Weight: 65.8 kg 65.8 kg    Telemetry    Sinus rhythm without paf- Personally Reviewed  ECG    ECG: None on file   Physical Exam    BP 112/60 (BP Location: Left Wrist)   Pulse 67   Temp 98.1 F (36.7 C) (Oral)   Resp (!) 22   Ht 5\' 3"  (1.6 m)   Wt 65.8 kg   SpO2 93%   BMI 25.70 kg/m  Physical Exam  Constitutional: She appears well-developed and well-nourished. No distress.  HENT:  Head: Normocephalic and atraumatic.  Eyes: Conjunctivae are normal.    Cardiovascular: Normal rate, regular rhythm and normal heart sounds.  Diminished dp pulses bilaterally  Respiratory: Effort normal and breath sounds normal. No respiratory distress. She has no wheezes.  GI: Soft. Bowel sounds are normal. She exhibits no distension. There is no tenderness.  Musculoskeletal: She exhibits no edema.  Neurological: She is alert.  Skin: She is not diaphoretic. No erythema.  Psychiatric: She has a normal mood and affect. Her behavior is normal. Judgment and thought content normal.    Labs    Chemistry Recent Labs  Lab 02/08/18 1209  NA 134*  K 4.2  CL 105  CO2 21*  GLUCOSE 259*  BUN 18  CREATININE 0.99  CALCIUM 9.0  GFRNONAA 51*  GFRAA 59*  ANIONGAP 8     Hematology Recent Labs  Lab 02/10/18 0225 02/11/18 0215 02/12/18 0228  WBC 11.4* 11.2* 11.2*  RBC 4.84 4.49 4.90  HGB 12.8 12.1 12.6  HCT 40.0 37.3 40.9  MCV 82.6 83.1 83.5  MCH 26.4 26.9 25.7*  MCHC 32.0 32.4 30.8  RDW 14.6 14.7 14.9  PLT 223 210 242  Cardiac EnzymesNo results for input(s): TROPONINI in the last 168 hours. No results for input(s): TROPIPOC in the last 168 hours.   BNPNo results for input(s): BNP, PROBNP in the last 168 hours.   DDimer No results for input(s): DDIMER in the last 168 hours.   Lipid Panel     Component Value Date/Time   CHOL 248 (H) 06/23/2016 1157   TRIG (H) 06/23/2016 1157    406.0 Triglyceride is over 400; calculations on Lipids are invalid.   HDL 38.40 (L) 06/23/2016 1157   CHOLHDL 6 06/23/2016 1157   VLDL 76.6 (H) 08/17/2015 1006   LDLCALC 79 04/13/2007 0800   LDLDIRECT 101.0 06/23/2016 1157     Radiology    No results found.  Cardiac Studies   CT Angio (10/31): IMPRESSION: VASCULAR  1. Abdominal aortic atherosclerosis without evidence of aneurysm or stenosis. 2. Accessory lower pole renal artery on the left. 3. Right lower extremity: No significant obstructive disease involving the common or external iliac arteries.  Significant disease of the internal iliac artery. Roughly 50-60% focal stenosis of the right common femoral artery. Occluded proximal right SFA. 4. Left lower extremity: No significant obstructive disease involving the common or external iliac arteries. Probable near-total occlusive disease of the proximal left internal iliac artery. No significant stenosis of the left common femoral artery. Diffuse high-grade stenosis of the proximal left SFA.  NON-VASCULAR  1. 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 The right renal pelvis does appear slightly more dilated than on the prior CT in 2018. This may not be of any clinical significance if renal function is normal and the patient is asymptomatic. 2. Tiny umbilical hernia containing fat.  TTE (01/25/18): Study Conclusions  - Left ventricle: The cavity size was normal. Wall thickness was   normal. Systolic function was normal. The estimated ejection   fraction was in the range of 55% to 60%. Wall motion was normal;   there were no regional wall motion abnormalities. Left   ventricular diastolic function parameters were normal. - Aortic valve: Valve mobility was restricted. There was moderate   stenosis. Valve area (VTI): 1.69 cm^2. Valve area (Vmax): 1.01   cm^2. Valve area (Vmean): 1.72 cm^2. - Mitral valve: Calcified annulus. Valve area by pressure   half-time: 2 cm^2. Valve area by continuity equation (using LVOT   flow): 2.82 cm^2. - Left atrium: The atrium was mildly dilated.  Impressions:  - Compared to November 2018, there is substantial improvement in   left ventricular function due to recovery of anteroapical   contractility. Increase aoertic valve gradients are due to   improved LV function, not due to worsening stenosis.   Patient Profile     84 y.o. female chronic systolic heart failure, multi-vessel cad, diabetes, pad, aortic stenosis with recent dyspnea, fatigue and  chest discomfort with activity. Outpatient cardiac performed 10/30 showing 95% stenosis ost lm to dist lm, 100% stenosed prox rca, 90% stenosed ost lad, 80% stensosed ost cx to prox cx. Patient was hospitalized for treatment of severe multivessel stenosis.   Assessment & Plan    1. CAD with unstable angina: Was deemed poor candidate for CABG and aortic valve replacement due to comorbidities. Dr. Burt Knack plans to perform high risk pci on 02/13/18 with impella pump placement and an orbital atherectomy of the left main and circumflex. Ct angio performed on 10/31 to help guide vascular access.   Continue IV heparin (level 0.71), aspirin 81mg , plavix 75mg  qd, carvedilol 6.25mg   bid, losartan 25mg  qd, atorvastatin 40mg  qd.  2. Ischemic cardiomyopathy: LVEF improved to 55-60% from 20-25% on prior echo in Nov 2018. Continue carvedilol 6.25mg  bid, losartan 25mg  qd  3. PAF: CHADSVASC 5, sinus rhythm noted. continue carvedilol 6.25mg  bid  4. Aortic Stenosis: Patient not candidate for aortic valve replacement   Lars Mage, MD Internal Medicine PGY2 QASTM:[phone] 02/12/2018, 1:27 PM    Patient seen and examined.  I reviewed the angiographic images of the patient's diagnostic catheterization of February 07, 2018 as well as discussed her case with Dr. Burt Knack.  I also discussed the planned procedure to be done tomorrow with Dr. Burt Knack which will require an Impella for hemodynamic support prior to any attempt at complex intervention with the atherectomy involving the left main, LAD, and ostial circumflex and bifurcation stenting of the LAD and circumflex vessels.  If an Impella cannot be inserted, the procedure may need to be aborted.  Presently, the patient is pain-free on heparin drip.  No ECG has been done since January 17, 2018 and I will repeat an ECG today.  She has moderate aortic valve stenosis and has a mean gradient of 27 and peak instantaneous gradient of 48 mm on echo.  Valve area was 1.01 cm.   Presently she is without anginal symptoms.  Lungs are relatively clear and she appears euvolemic.  Creatinine is 0.99.  We will obtain fasting lipid panel to reassess her significant mixed hyperlipidemia which has not been assessed over the past 18 months.  I answered the daughter's many questions.  They are aware of the complexity of the procedure including risk benefits and accept this risk.   Troy Sine, MD, [facility] 02/12/2018 1:51 PM

## 2018-02-13 ENCOUNTER — Encounter (HOSPITAL_COMMUNITY)
Admission: RE | Disposition: A | Discharge status: Home / Self Care | Payer: Self-pay | Source: Home / Self Care | Attending: Cardiovascular Disease

## 2018-02-13 ENCOUNTER — Other Ambulatory Visit: Payer: Self-pay

## 2018-02-13 ENCOUNTER — Encounter (HOSPITAL_COMMUNITY): Payer: Self-pay | Admitting: Cardiovascular Disease

## 2018-02-13 DIAGNOSIS — I25110 Atherosclerotic heart disease of native coronary artery with unstable angina pectoris: Secondary | ICD-10-CM

## 2018-02-13 HISTORY — PX: VENTRICULAR ASSIST DEVICE INSERTION: CATH118273

## 2018-02-13 HISTORY — PX: CORONARY ATHERECTOMY: CATH118238

## 2018-02-13 LAB — CBC
HEMATOCRIT: 39.7 % (ref 36.0–46.0)
Hemoglobin: 13 g/dL (ref 12.0–15.0)
MCH: 27 pg (ref 26.0–34.0)
MCHC: 32.7 g/dL (ref 30.0–36.0)
MCV: 82.4 fL (ref 80.0–100.0)
NRBC: 0 % (ref 0.0–0.2)
Platelets: 212 10*3/uL (ref 150–400)
RBC: 4.82 MIL/uL (ref 3.87–5.11)
RDW: 14.8 % (ref 11.5–15.5)
WBC: 14 10*3/uL — AB (ref 4.0–10.5)

## 2018-02-13 LAB — BASIC METABOLIC PANEL
ANION GAP: 8 (ref 5–15)
BUN: 34 mg/dL — ABNORMAL HIGH (ref 8–23)
CALCIUM: 9.2 mg/dL (ref 8.9–10.3)
CO2: 23 mmol/L (ref 22–32)
Chloride: 102 mmol/L (ref 98–111)
Creatinine, Ser: 1.18 mg/dL — ABNORMAL HIGH (ref 0.44–1.00)
GFR, EST AFRICAN AMERICAN: 47 mL/min — AB (ref >60)
GFR, EST NON AFRICAN AMERICAN: 41 mL/min — AB (ref >60)
Glucose, Bld: 136 mg/dL — ABNORMAL HIGH (ref 70–99)
Potassium: 4.3 mmol/L (ref 3.5–5.1)
SODIUM: 133 mmol/L — AB (ref 135–145)

## 2018-02-13 LAB — HEPARIN LEVEL (UNFRACTIONATED): Heparin Unfractionated: 0.57 IU/mL (ref 0.30–0.70)

## 2018-02-13 LAB — GLUCOSE, CAPILLARY
GLUCOSE-CAPILLARY: 160 mg/dL — AB (ref 70–99)
Glucose-Capillary: 144 mg/dL — ABNORMAL HIGH (ref 70–99)
Glucose-Capillary: 129 mg/dL — ABNORMAL HIGH (ref 70–99)
Glucose-Capillary: 188 mg/dL — ABNORMAL HIGH (ref 70–99)

## 2018-02-13 LAB — POCT ACTIVATED CLOTTING TIME
Activated Clotting Time: 356 seconds
Activated Clotting Time: 142 seconds

## 2018-02-13 SURGERY — CORONARY ATHERECTOMY
Anesthesia: LOCAL

## 2018-02-13 MED ORDER — LABETALOL HCL 5 MG/ML IV SOLN: 10.0000 mg | INTRAVENOUS | Status: AC | PRN

## 2018-02-13 MED ORDER — SODIUM CHLORIDE 0.9 % WEIGHT BASED INFUSION
1.0000 mL/kg/h | INTRAVENOUS | Status: DC
  Administered 2018-02-13: 1 mL/kg/h via INTRAVENOUS

## 2018-02-13 MED ORDER — SODIUM CHLORIDE 0.9% FLUSH: 3.0000 mL | INTRAVENOUS | Status: DC | PRN

## 2018-02-13 MED ORDER — SODIUM CHLORIDE 0.9% FLUSH
3.0000 mL | Freq: Two times a day (BID) | INTRAVENOUS | Status: DC
  Administered 2018-02-14 (×2): 3 mL via INTRAVENOUS

## 2018-02-13 MED ORDER — VERAPAMIL HCL 2.5 MG/ML IV SOLN
INTRAVENOUS | Status: AC
  Filled 2018-02-13: qty 2

## 2018-02-13 MED ORDER — HYDRALAZINE HCL 20 MG/ML IJ SOLN: 5.0000 mg | INTRAMUSCULAR | Status: AC | PRN

## 2018-02-13 MED ORDER — HEPARIN (PORCINE) IN NACL 1000-0.9 UT/500ML-% IV SOLN
INTRAVENOUS | Status: AC
  Filled 2018-02-13: qty 500

## 2018-02-13 MED ORDER — LIDOCAINE HCL (PF) 1 % IJ SOLN
INTRAMUSCULAR | Status: AC
  Filled 2018-02-13: qty 30

## 2018-02-13 MED ORDER — SODIUM CHLORIDE 0.9 % IV SOLN: 250.0000 mL | INTRAVENOUS | Status: DC | PRN

## 2018-02-13 MED ORDER — VIPERSLIDE LUBRICANT OPTIME
TOPICAL | Status: DC | PRN
  Administered 2018-02-13: 11:00:00 via SURGICAL_CAVITY

## 2018-02-13 MED ORDER — BIVALIRUDIN TRIFLUOROACETATE 250 MG IV SOLR
INTRAVENOUS | Status: AC
  Filled 2018-02-13: qty 250

## 2018-02-13 MED ORDER — SODIUM CHLORIDE 0.9 % IV SOLN
INTRAVENOUS | Status: AC | PRN
  Administered 2018-02-13: 10 mL/h via INTRAVENOUS

## 2018-02-13 MED ORDER — SODIUM CHLORIDE 0.9 % WEIGHT BASED INFUSION
3.0000 mL/kg/h | INTRAVENOUS | Status: DC
  Administered 2018-02-13: 3 mL/kg/h via INTRAVENOUS

## 2018-02-13 MED ORDER — MIDAZOLAM HCL 2 MG/2ML IJ SOLN
INTRAMUSCULAR | Status: AC
  Filled 2018-02-13: qty 2

## 2018-02-13 MED ORDER — SODIUM CHLORIDE 0.9 % IV SOLN
INTRAVENOUS | Status: DC | PRN
  Administered 2018-02-13 (×2): 1.75 mg/kg/h via INTRAVENOUS

## 2018-02-13 MED ORDER — NITROGLYCERIN 1 MG/10 ML FOR IR/CATH LAB
INTRA_ARTERIAL | Status: DC | PRN
  Administered 2018-02-13 (×2): 100 ug via INTRACORONARY
  Administered 2018-02-13: 150 ug via INTRACORONARY

## 2018-02-13 MED ORDER — VERAPAMIL HCL 2.5 MG/ML IV SOLN
INTRAVENOUS | Status: DC | PRN
  Administered 2018-02-13: 10 mL via INTRA_ARTERIAL

## 2018-02-13 MED ORDER — FENTANYL CITRATE (PF) 100 MCG/2ML IJ SOLN
INTRAMUSCULAR | Status: AC
  Filled 2018-02-13: qty 2

## 2018-02-13 MED ORDER — SODIUM CHLORIDE 0.9% FLUSH: 3.0000 mL | Freq: Two times a day (BID) | INTRAVENOUS | Status: DC

## 2018-02-13 MED ORDER — NITROGLYCERIN 1 MG/10 ML FOR IR/CATH LAB
INTRA_ARTERIAL | Status: AC
  Filled 2018-02-13: qty 10

## 2018-02-13 MED ORDER — LIDOCAINE HCL (PF) 1 % IJ SOLN
INTRAMUSCULAR | Status: DC | PRN
  Administered 2018-02-13: 2 mL
  Administered 2018-02-13: 30 mL

## 2018-02-13 MED ORDER — ASPIRIN 81 MG PO CHEW
81.0000 mg | CHEWABLE_TABLET | ORAL | Status: AC
  Administered 2018-02-13: 81 mg via ORAL
  Filled 2018-02-13: qty 1

## 2018-02-13 MED ORDER — BIVALIRUDIN BOLUS VIA INFUSION - CUPID
INTRAVENOUS | Status: DC | PRN
  Administered 2018-02-13: 47.475 mg via INTRAVENOUS

## 2018-02-13 MED ORDER — SODIUM CHLORIDE 0.9 % WEIGHT BASED INFUSION: 1.0000 mL/kg/h | INTRAVENOUS | Status: AC

## 2018-02-13 MED ORDER — HEPARIN (PORCINE) IN NACL 1000-0.9 UT/500ML-% IV SOLN
INTRAVENOUS | Status: DC | PRN
  Administered 2018-02-13 (×2): 500 mL

## 2018-02-13 MED ORDER — MIDAZOLAM HCL 2 MG/2ML IJ SOLN
INTRAMUSCULAR | Status: DC | PRN
  Administered 2018-02-13 (×6): 1 mg via INTRAVENOUS

## 2018-02-13 MED ORDER — FENTANYL CITRATE (PF) 100 MCG/2ML IJ SOLN
INTRAMUSCULAR | Status: DC | PRN
  Administered 2018-02-13 (×7): 25 ug via INTRAVENOUS

## 2018-02-13 MED ORDER — IOHEXOL 350 MG/ML SOLN
INTRAVENOUS | Status: DC | PRN
  Administered 2018-02-13: 145 mL via INTRA_ARTERIAL

## 2018-02-13 SURGICAL SUPPLY — 44 items
BALLN MINITREK OTW 1.5X12 (BALLOONS) ×2
BALLN SAPPHIRE 2.0X12 (BALLOONS) ×2
BALLN SAPPHIRE 3.0X15 (BALLOONS) ×2
BALLN SAPPHIRE ~~LOC~~ 3.0X12 (BALLOONS) ×1 IMPLANT
BALLN SAPPHIRE ~~LOC~~ 4.0X8 (BALLOONS) ×1 IMPLANT
BALLOON MINITREK OTW 1.5X12 (BALLOONS) IMPLANT
BALLOON SAPPHIRE 2.0X12 (BALLOONS) IMPLANT
BALLOON SAPPHIRE 3.0X15 (BALLOONS) IMPLANT
CABLE ADAPT CONN TEMP 6FT (ADAPTER) ×1 IMPLANT
CATH INFINITI 5FR ANG PIGTAIL (CATHETERS) ×1 IMPLANT
CATH INFINITI JR4 5F (CATHETERS) ×1 IMPLANT
CATH LAUNCHER 6FR EBU3.5 (CATHETERS) ×1 IMPLANT
CATH S G BIP PACING (SET/KITS/TRAYS/PACK) ×1 IMPLANT
CROWN DIAMONDBACK CLASSIC 1.25 (BURR) ×1 IMPLANT
DEVICE CLOSURE PERCLS PRGLD 6F (VASCULAR PRODUCTS) IMPLANT
DEVICE RAD COMP TR BAND LRG (VASCULAR PRODUCTS) ×1 IMPLANT
ELECT DEFIB PAD ADLT CADENCE (PAD) ×1 IMPLANT
GLIDESHEATH SLEND SS 6F .021 (SHEATH) ×2 IMPLANT
GUIDEWIRE INQWIRE 1.5J.035X260 (WIRE) IMPLANT
INQWIRE 1.5J .035X260CM (WIRE) ×2
KIT ENCORE 26 ADVANTAGE (KITS) ×3 IMPLANT
KIT HEART LEFT (KITS) ×2 IMPLANT
KIT HEMO VALVE WATCHDOG (MISCELLANEOUS) ×1 IMPLANT
KIT MICROPUNCTURE NIT STIFF (SHEATH) ×1 IMPLANT
LUBRICANT VIPERSLIDE CORONARY (MISCELLANEOUS) ×2 IMPLANT
PACK CARDIAC CATHETERIZATION (CUSTOM PROCEDURE TRAY) ×2 IMPLANT
PERCLOSE PROGLIDE 6F (VASCULAR PRODUCTS) ×6
SET IMPELLA CP PUMP (CATHETERS) ×1 IMPLANT
SHEATH BRITE TIP 6FR 35CM (SHEATH) ×1 IMPLANT
SHEATH PINNACLE 6F 10CM (SHEATH) ×1 IMPLANT
SHEATH PINNACLE 7F 10CM (SHEATH) ×4 IMPLANT
SHEATH PROBE COVER 6X72 (BAG) ×1 IMPLANT
SHIELD RADPAD SCOOP 12X17 (MISCELLANEOUS) ×1 IMPLANT
STENT SIERRA 3.00 X 15 MM (Permanent Stent) ×1 IMPLANT
STENT SIERRA 3.00 X 18 MM (Permanent Stent) ×1 IMPLANT
TRANSDUCER W/STOPCOCK (MISCELLANEOUS) ×2 IMPLANT
TUBING CIL FLEX 10 FLL-RA (TUBING) ×2 IMPLANT
WIRE COUGAR XT STRL 190CM (WIRE) ×2 IMPLANT
WIRE COUGAR XT STRL 300CM (WIRE) ×1 IMPLANT
WIRE EMERALD 3MM-J .035X150CM (WIRE) ×2 IMPLANT
WIRE HI TORQ VERSACORE-J 145CM (WIRE) ×1 IMPLANT
WIRE HI TORQ WHISPER MS 190CM (WIRE) ×3 IMPLANT
WIRE MICROINTRODUCER 60CM (WIRE) ×2 IMPLANT
WIRE VIPER ADVANCE COR .012TIP (WIRE) ×2 IMPLANT

## 2018-02-13 NOTE — Progress Notes (Addendum)
Progress Note  Patient Name: [name] Date of Encounter: 02/13/2018  Primary Cardiologist: No primary provider on file, Dr. Burt Knack   Subjective   Patient was seen resting in her bed this morning with her son and daughter at bedside. She denied any chest pain or dyspnea. She stated that she was anxious about the procedure.   Inpatient Medications    Scheduled Meds: . aspirin EC  81 mg Oral Daily  . atorvastatin  40 mg Oral q1800  . calcium carbonate  1 tablet Oral BID  . carvedilol  6.25 mg Oral BID WC  . clopidogrel  75 mg Oral Daily  . furosemide  40 mg Oral Daily  . insulin aspart  0-15 Units Subcutaneous TID WC  . insulin aspart  4 Units Subcutaneous TID WC  . losartan  25 mg Oral Daily  . pantoprazole  40 mg Oral Daily  . potassium chloride  10 mEq Oral Daily  . sodium chloride flush  3 mL Intravenous Q12H  . sodium chloride flush  3 mL Intravenous Q12H   Continuous Infusions: . sodium chloride    . sodium chloride    . sodium chloride 1 mL/kg/hr (02/13/18 0617)  . heparin 900 Units/hr (02/13/18 0602)   PRN Meds: sodium chloride, sodium chloride, acetaminophen, acetaminophen, ondansetron (ZOFRAN) IV, sodium chloride flush, sodium chloride flush   Vital Signs    Vitals:   02/12/18 2358 02/13/18 0300 02/13/18 0307 02/13/18 0534  BP: (!) 99/57  (!) 146/93   Pulse: 64  69   Resp: 19 (!) 22 (!) 33   Temp: 97.6 F (36.4 C)  (!) 97.5 F (36.4 C)   TempSrc: Oral  Oral   SpO2: 95% 100% 94%   Weight:    63.3 kg  Height:        Intake/Output Summary (Last 24 hours) at 02/13/2018 0730 Last data filed at 02/12/2018 2115 Gross per 24 hour  Intake 534 ml  Output 700 ml  Net -166 ml    I/O since admission:   Filed Weights   02/07/18 0906 02/07/18 1313 02/13/18 0534  Weight: 65.8 kg 65.8 kg 63.3 kg    Telemetry    Normal sinus rhythm without abnormalities - Personally Reviewed  ECG    ECG (independently read by me): 11/4- normal sinus  rhythm  Physical Exam    BP (!) 146/93 (BP Location: Left Wrist)   Pulse 69   Temp (!) 97.5 F (36.4 C) (Oral)   Resp (!) 33   Ht 5\' 3"  (1.6 m)   Wt 63.3 kg   SpO2 94%   BMI 24.72 kg/m   Physical Exam  Constitutional: She appears well-developed and well-nourished. No distress.  HENT:  Head: Normocephalic and atraumatic.  Eyes: Conjunctivae are normal.  Cardiovascular: Normal rate and regular rhythm.  Murmur (systolic-aortic) heard. Difficult to appreciate dp pulses bilaterally  Pulmonary/Chest: Effort normal and breath sounds normal. No stridor. No respiratory distress.  Abdominal: Soft. Bowel sounds are normal. She exhibits no distension. There is no tenderness.  Musculoskeletal: She exhibits no edema.  Neurological: She is alert.  Skin: She is not diaphoretic. No erythema.  Psychiatric: She has a normal mood and affect. Her behavior is normal. Judgment and thought content normal.     Labs    Chemistry Recent Labs  Lab 02/08/18 1209 02/13/18 0531  NA 134* 133*  K 4.2 4.3  CL 105 102  CO2 21* 23  GLUCOSE 259* 136*  BUN 18 34*  CREATININE 0.99 1.18*  CALCIUM 9.0 9.2  GFRNONAA 51* 41*  GFRAA 59* 47*  ANIONGAP 8 8     Hematology Recent Labs  Lab 02/11/18 0215 02/12/18 0228 02/13/18 0259  WBC 11.2* 11.2* 14.0*  RBC 4.49 4.90 4.82  HGB 12.1 12.6 13.0  HCT 37.3 40.9 39.7  MCV 83.1 83.5 82.4  MCH 26.9 25.7* 27.0  MCHC 32.4 30.8 32.7  RDW 14.7 14.9 14.8  PLT 210 242 212    Cardiac EnzymesNo results for input(s): TROPONINI in the last 168 hours. No results for input(s): TROPIPOC in the last 168 hours.   BNPNo results for input(s): BNP, PROBNP in the last 168 hours.   DDimer No results for input(s): DDIMER in the last 168 hours.   Lipid Panel     Component Value Date/Time   CHOL 137 02/12/2018 1410   TRIG 187 (H) 02/12/2018 1410   HDL 48 02/12/2018 1410   CHOLHDL 2.9 02/12/2018 1410   VLDL 37 02/12/2018 1410   LDLCALC 52 02/12/2018 1410    LDLDIRECT 101.0 06/23/2016 1157     Radiology    No results found.  Cardiac Studies   CT Angio (10/31): IMPRESSION: VASCULAR  1. Abdominal aortic atherosclerosis without evidence of aneurysm or stenosis. 2. Accessory lower pole renal artery on the left. 3. Right lower extremity: No significant obstructive disease involving the common or external iliac arteries. Significant disease of the internal iliac artery. Roughly 50-60% focal stenosis of the right common femoral artery. Occluded proximal right SFA. 4. Left lower extremity: No significant obstructive disease involving the common or external iliac arteries. Probable near-total occlusive disease of the proximal left internal iliac artery. No significant stenosis of the left common femoral artery. Diffuse high-grade stenosis of the proximal left SFA.  NON-VASCULAR  1. 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 The right renal pelvis does appear slightly more dilated than on the prior CT in 2018. This may not be of any clinical significance if renal function is normal and the patient is asymptomatic. 2. Tiny umbilical hernia containing fat.  TTE (01/25/18): Study Conclusions  - Left ventricle: The cavity size was normal. Wall thickness was normal. Systolic function was normal. The estimated ejection fraction was in the range of 55% to 60%. Wall motion was normal; there were no regional wall motion abnormalities. Left ventricular diastolic function parameters were normal. - Aortic valve: Valve mobility was restricted. There was moderate stenosis. Valve area (VTI): 1.69 cm^2. Valve area (Vmax): 1.01 cm^2. Valve area (Vmean): 1.72 cm^2. - Mitral valve: Calcified annulus. Valve area by pressure half-time: 2 cm^2. Valve area by continuity equation (using LVOT flow): 2.82 cm^2. - Left atrium: The atrium was mildly dilated.  Impressions:  - Compared to  November 2018, there is substantial improvement in left ventricular function due to recovery of anteroapical contractility. Increase aoertic valve gradients are due to improved LV function, not due to worsening stenosis.  Cardiac cath 10/30:  Ost LM to Dist LM lesion is 95% stenosed.  Ost LAD lesion is 90% stenosed.  Ost Cx to Prox Cx lesion is 80% stenosed.  Prox RCA lesion is 100% stenosed.  There is moderate left ventricular systolic dysfunction.  LV end diastolic pressure is moderately elevated.  The left ventricular ejection fraction is 35-45% by visual estimate.  Ost 2nd Mrg to 2nd Mrg lesion is 50% stenosed.   1.  Critical stenosis of the distal left mainstem extending into the proximal LAD and circumflex 2.  Chronic total occlusion of the RCA collateralized by right to right and left to right collaterals 3.  Moderate segmental LV systolic dysfunction 4.  Moderate aortic stenosis with mean transvalvular gradient 16 mmHg and calculated aortic valve area 0.95 cm 5.  Reduced cardiac output by Fick measurement  Recommend: Hospital admission, IV heparin, consider revascularization options of multivessel CABG versus hemodynamically supported PCI.  PCI would likely come with prohibitive risk if the patient cannot be hemodynamically supported.  Even with support her risk will be increased with heavy calcification and need to perform atherectomy of the left mainstem in the presence of an occluded RCA.   Patient Profile     84 y.o. female chronic systolic heart failure, multi-vessel cad, diabetes, pad, aortic stenosis with recent dyspnea, fatigue and chest discomfort with activity. Outpatient cardiac performed 10/30 showing 95% stenosis ost lm to dist lm, 100% stenosed prox rca, 90% stenosed ost lad, 80% stensosed ost cx to prox cx. Patient was hospitalized for treatment of severe multivessel stenosis.   Assessment & Plan    1. CAD with unstable angina: Patient is to get  pci, orbital atherectomy of left main and circumflex and impella pump placed today  11/5. Was deemed poor candidate for CABG and aortic valve replacement due to comorbidities. Will continue follow patient post procedure. Creatinine increased to 1.18 from 0.99.  Lipid panel: showing tcholes=137, trig=187, hdl=48, ldl=52  Continue IV heparin (level 0.71), aspirin 81mg , plavix 75mg  qd, carvedilol 6.25mg  bid, losartan 25mg  qd, atorvastatin 40mg  qd.  2. Ischemic cardiomyopathy: LVEF improved to 55-60% from 20-25% on prior echo in Nov 2018. Continue carvedilol 6.25mg  bid, losartan 25mg  qd  3. PAF: CHADSVASC 5, sinus rhythm noted. continue carvedilol 6.25mg  bid  4. Aortic Stenosis: Patient not candidate for aortic valve replacement. Will continue to monitor   Lars Mage, MD Internal Medicine PGY2 HUDJS:[phone] 02/13/2018, 8:16 AM    Patient seen and examined. Agree with assessment and plan. Reviewed angios. Excellent result in a very difficult and complex procedure. Pt is without chest pain. Both groin sites and R radial site stable.  Normal hemodynamics; Sinus rhythm in the 80s. Medical therapy for mid LAD lesion and RCA. F/U labs in am.   Troy Sine, MD, [facility] 02/13/2018 6:07 PM

## 2018-02-13 NOTE — Interval H&P Note (Signed)
Cath Lab Visit (complete for each Cath Lab visit)  Clinical Evaluation Leading to the Procedure:   ACS: Yes.    Non-ACS:    Anginal Classification: CCS IV  Anti-ischemic medical therapy: Minimal Therapy (1 class of medications)  Non-Invasive Test Results: No non-invasive testing performed  Prior CABG: No previous CABG  I've had extensive discussion with interventional colleagues, cardiac surgery, and the patient and her family regarding treatment options of critical left main disease. She's been turned down for CABG. Plan hemodynamically supported PCI today.  History and Physical Interval Note:  02/13/2018 8:11 AM  [name]  has presented today for surgery, with the diagnosis of CAD  The various methods of treatment have been discussed with the patient and family. After consideration of risks, benefits and other options for treatment, the patient has consented to  Procedure(s): CORONARY ATHERECTOMY (N/A) VENTRICULAR ASSIST DEVICE INSERTION (N/A) as a surgical intervention .  The patient's history has been reviewed, patient examined, no change in status, stable for surgery.  I have reviewed the patient's chart and labs.  Questions were answered to the patient's satisfaction.     Sherren Mocha

## 2018-02-13 NOTE — Progress Notes (Signed)
ANTICOAGULATION CONSULT NOTE  Pharmacy Consult for Heparin Indication: chest pain/ACS s/p Cath plan complex PCI  Allergies  Allergen Reactions  . Metformin     unspecified  . Penicillins     unspecified Has patient had a PCN reaction causing immediate rash, facial/tongue/throat swelling, SOB or lightheadedness with hypotension: Unknown Has patient had a PCN reaction causing severe rash involving mucus membranes or skin necrosis: Unknown Has patient had a PCN reaction that required hospitalization: No Has patient had a PCN reaction occurring within the last 10 years: No If all of the above answers are "NO", then may proceed with Cephalosporin use.   . Codeine Palpitations    Rapid heart rate  . Fenofibrate Palpitations  . Pioglitazone Rash    Patient Measurements: Height: 5\' 3"  (160 cm) Weight: 139 lb 8.8 oz (63.3 kg) IBW/kg (Calculated) : 52.4   Vital Signs: Temp: 97.5 F (36.4 C) (11/05 0307) Temp Source: Oral (11/05 0307) BP: 146/93 (11/05 0307) Pulse Rate: 68 (11/05 0730)  Labs: Recent Labs    02/11/18 0215 02/12/18 0228 02/13/18 0259 02/13/18 0531  HGB 12.1 12.6 13.0  --   HCT 37.3 40.9 39.7  --   PLT 210 242 212  --   HEPARINUNFRC 0.51 0.71* 0.57  --   CREATININE  --   --   --  1.18*    Estimated Creatinine Clearance: 31.3 mL/min (A) (by C-G formula based on SCr of 1.18 mg/dL (H)).   Medical History: Past Medical History:  Diagnosis Date  . Anxiety   . Diaphragmatic hernia without mention of obstruction or gangrene   . Diverticulosis of colon (without mention of hemorrhage) 2009  . Fibromyalgia   . GERD (gastroesophageal reflux disease)   . Hiatal hernia 2009   s/p nissen fundoplication 8144.    Marland Kitchen Hx of adenomatous colonic polyps 2007, 2009   Adenomatous polyps in 2007 and 2009.  Hyperplastic polyps in 2009  . Hyperlipidemia   . IBS (irritable bowel syndrome)   . Internal hemorrhoids without mention of complication 8185   Seen at colonoscopy.   . Thyroid disease   . Type II or unspecified type diabetes mellitus with neurological manifestations, not stated as uncontrolled(250.60)     Assessment: [name] admitted with CP taken to cath lab found to have multivessel CAD - plan complex PCI today.    Heparin level therapeutic at 0.57, on 900 units/hr. Hgb 13, plt 212. No s/sx of bleeding. No infusion issues.    Goal of Therapy:  Heparin level 0.3-0.7 units/ml Monitor platelets by anticoagulation protocol: Yes   Plan:  Continue heparin infusion at 900 units/hr Daily HL, CBC, monitor s/sx of bleeding F/u after complex PCI today  Doylene Canard, PharmD Clinical Pharmacist  Pager: [phone] Phone: [phone] Please check AMION for all Pharmacist numbers by unit 02/13/2018 7:45 AM

## 2018-02-13 NOTE — Progress Notes (Signed)
ACT 142  Left femoral venous sheath removed.  9 minutes of pressure held. Site level 0. VSS. Beckie Salts RN as second Therapist, sports at bedside.   To note, the left femoral arterial site still slightly oozing.    RN will continue to monitor patient.

## 2018-02-13 NOTE — Progress Notes (Signed)
Not coming back to 2c belongings  endorsed to the family.

## 2018-02-13 NOTE — Progress Notes (Signed)
Transported to the cath lab by bed awake and alert. 

## 2018-02-14 LAB — BASIC METABOLIC PANEL
Anion gap: 3 — ABNORMAL LOW (ref 5–15)
BUN: 23 mg/dL (ref 8–23)
CHLORIDE: 109 mmol/L (ref 98–111)
CO2: 23 mmol/L (ref 22–32)
CREATININE: 0.85 mg/dL (ref 0.44–1.00)
Calcium: 8.9 mg/dL (ref 8.9–10.3)
GFR calc Af Amer: >60 mL/min (ref >60)
GFR calc non Af Amer: >60 mL/min (ref >60)
GLUCOSE: 148 mg/dL — AB (ref 70–99)
POTASSIUM: 4.4 mmol/L (ref 3.5–5.1)
SODIUM: 135 mmol/L (ref 135–145)

## 2018-02-14 LAB — GLUCOSE, CAPILLARY
GLUCOSE-CAPILLARY: 174 mg/dL — AB (ref 70–99)
GLUCOSE-CAPILLARY: 175 mg/dL — AB (ref 70–99)
GLUCOSE-CAPILLARY: 160 mg/dL — AB (ref 70–99)
Glucose-Capillary: 173 mg/dL — ABNORMAL HIGH (ref 70–99)
Glucose-Capillary: 173 mg/dL — ABNORMAL HIGH (ref 70–99)

## 2018-02-14 LAB — CBC
HCT: 35.7 % — ABNORMAL LOW (ref 36.0–46.0)
HEMOGLOBIN: 11.2 g/dL — AB (ref 12.0–15.0)
MCH: 26.2 pg (ref 26.0–34.0)
MCHC: 31.4 g/dL (ref 30.0–36.0)
MCV: 83.4 fL (ref 80.0–100.0)
Platelets: 208 10*3/uL (ref 150–400)
RBC: 4.28 MIL/uL (ref 3.87–5.11)
RDW: 15 % (ref 11.5–15.5)
WBC: 13 10*3/uL — ABNORMAL HIGH (ref 4.0–10.5)
nRBC: 0 % (ref 0.0–0.2)

## 2018-02-14 NOTE — Progress Notes (Signed)
CARDIAC REHAB PHASE I   PRE:  Rate/Rhythm: 73 SR  BP:  Sitting: 130/73        SaO2: 100 RA  MODE:  Ambulation: 200 ft   POST:  Rate/Rhythm: 80 SR  BP:  Sitting: 129/73        SaO2: 95 RA  0920 - 1035  Pt ambulated with walker and assistance x2 for 200 ft. Gait was slow. Pt denies chest pain and/or dizziness. Tolerated well. Ed complete on HF booklet, stent card, restrictions, and NTG usage. Ed complete on exercise guidelines and diet (HH, low NA, DM). Referred to [facility] at [facility], but pt says she is not interested.   Philis Kendall, MS 02/14/2018 10:27 AM

## 2018-02-14 NOTE — Progress Notes (Addendum)
Progress Note  Patient Name: [name] Date of Encounter: 02/14/2018  Primary Cardiologist: No primary on file  Subjective   [name] was seen resting comfortably seated upright at bedside. She denied any chest pain or dyspnea overnight. She mentioned that she had some mild soreness in her right upper extremity at site used during procedure.   Inpatient Medications    Scheduled Meds: . aspirin EC  81 mg Oral Daily  . atorvastatin  40 mg Oral q1800  . calcium carbonate  1 tablet Oral BID  . carvedilol  6.25 mg Oral BID WC  . clopidogrel  75 mg Oral Daily  . furosemide  40 mg Oral Daily  . insulin aspart  0-15 Units Subcutaneous TID WC  . insulin aspart  4 Units Subcutaneous TID WC  . losartan  25 mg Oral Daily  . pantoprazole  40 mg Oral Daily  . potassium chloride  10 mEq Oral Daily  . sodium chloride flush  3 mL Intravenous Q12H  . sodium chloride flush  3 mL Intravenous Q12H   Continuous Infusions: . sodium chloride    . sodium chloride     PRN Meds: sodium chloride, sodium chloride, acetaminophen, acetaminophen, ondansetron (ZOFRAN) IV, sodium chloride flush, sodium chloride flush   Vital Signs    Vitals:   02/14/18 0600 02/14/18 0700 02/14/18 0750 02/14/18 0800  BP: (!) 122/44   140/67  Pulse:      Resp: 16 17  17   Temp:   98 F (36.7 C)   TempSrc:   Oral   SpO2: 93% 92%  94%  Weight:      Height:        Intake/Output Summary (Last 24 hours) at 02/14/2018 0830 Last data filed at 02/14/2018 0600 Gross per 24 hour  Intake 1135.59 ml  Output 1575 ml  Net -439.41 ml    I/O since admission:   Filed Weights   02/07/18 0906 02/07/18 1313 02/13/18 0534  Weight: 65.8 kg 65.8 kg 63.3 kg    Telemetry    Normal sinus rhythm- Personally Reviewed  ECG    ECG (independently read by me): Normal sinus rhythm, R wave progression in v2  Physical Exam    BP 140/67   Pulse 71   Temp 98 F (36.7 C) (Oral)   Resp 17   Ht 5\' 3"  (1.6 m)   Wt 63.3  kg   SpO2 94%   BMI 24.72 kg/m   Physical Exam  Constitutional: She appears well-developed and well-nourished. No distress.  HENT:  Head: Normocephalic and atraumatic.  Eyes: Conjunctivae are normal.  Cardiovascular: Normal rate and regular rhythm.  Murmur (aortic area-systolic murmur) heard. Pulmonary/Chest: Effort normal and breath sounds normal. No stridor. No respiratory distress.  Abdominal: Soft. Bowel sounds are normal. She exhibits no distension. There is no tenderness.  Musculoskeletal: She exhibits no edema.  Skin: She is not diaphoretic.  Mild amount of oozing at site of impella placement  Psychiatric: She has a normal mood and affect. Her behavior is normal. Judgment and thought content normal.    Labs    Chemistry Recent Labs  Lab 02/08/18 1209 02/13/18 0531 02/14/18 0445  NA 134* 133* 135  K 4.2 4.3 4.4  CL 105 102 109  CO2 21* 23 23  GLUCOSE 259* 136* 148*  BUN 18 34* 23  CREATININE 0.99 1.18* 0.85  CALCIUM 9.0 9.2 8.9  GFRNONAA 51* 41* >60  GFRAA 59* 47* >60  ANIONGAP 8 8  3*     Hematology Recent Labs  Lab 02/12/18 0228 02/13/18 0259 02/14/18 0445  WBC 11.2* 14.0* 13.0*  RBC 4.90 4.82 4.28  HGB 12.6 13.0 11.2*  HCT 40.9 39.7 35.7*  MCV 83.5 82.4 83.4  MCH 25.7* 27.0 26.2  MCHC 30.8 32.7 31.4  RDW 14.9 14.8 15.0  PLT 242 212 208    Cardiac EnzymesNo results for input(s): TROPONINI in the last 168 hours. No results for input(s): TROPIPOC in the last 168 hours.   BNPNo results for input(s): BNP, PROBNP in the last 168 hours.   DDimer No results for input(s): DDIMER in the last 168 hours.   Lipid Panel     Component Value Date/Time   CHOL 137 02/12/2018 1410   TRIG 187 (H) 02/12/2018 1410   HDL 48 02/12/2018 1410   CHOLHDL 2.9 02/12/2018 1410   VLDL 37 02/12/2018 1410   LDLCALC 52 02/12/2018 1410   LDLDIRECT 101.0 06/23/2016 1157     Radiology    No results found.  Cardiac Studies   Coronary arthrectomy 02/13/2018  Ost LM  to Dist LM lesion is 95% stenosed.  Ost LAD lesion is 90% stenosed.  Ost Cx to Prox Cx lesion is 80% stenosed.  Ost 2nd Mrg to 2nd Mrg lesion is 50% stenosed.  Prox RCA lesion is 100% stenosed.  A drug-eluting stent was successfully placed using a STENT SIERRA 3.00 X 18 MM.  Post intervention, there is a 0% residual stenosis.  Post intervention, there is a 0% residual stenosis.  A stent was successfully placed.  Post intervention, there is a 0% residual stenosis.  CT Angio (10/31): IMPRESSION: VASCULAR  1. Abdominal aortic atherosclerosis without evidence of aneurysm or stenosis. 2. Accessory lower pole renal artery on the left. 3. Right lower extremity: No significant obstructive disease involving the common or external iliac arteries. Significant disease of the internal iliac artery. Roughly 50-60% focal stenosis of the right common femoral artery. Occluded proximal right SFA. 4. Left lower extremity: No significant obstructive disease involving the common or external iliac arteries. Probable near-total occlusive disease of the proximal left internal iliac artery. No significant stenosis of the left common femoral artery. Diffuse high-grade stenosis of the proximal left SFA.  NON-VASCULAR  1. 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 The right renal pelvis does appear slightly more dilated than on the prior CT in 2018. This may not be of any clinical significance if renal function is normal and the patient is asymptomatic. 2. Tiny umbilical hernia containing fat.  TTE (01/25/18): Study Conclusions  - Left ventricle: The cavity size was normal. Wall thickness was normal. Systolic function was normal. The estimated ejection fraction was in the range of 55% to 60%. Wall motion was normal; there were no regional wall motion abnormalities. Left ventricular diastolic function parameters were normal. - Aortic  valve: Valve mobility was restricted. There was moderate stenosis. Valve area (VTI): 1.69 cm^2. Valve area (Vmax): 1.01 cm^2. Valve area (Vmean): 1.72 cm^2. - Mitral valve: Calcified annulus. Valve area by pressure half-time: 2 cm^2. Valve area by continuity equation (using LVOT flow): 2.82 cm^2. - Left atrium: The atrium was mildly dilated.  Impressions:  - Compared to November 2018, there is substantial improvement in left ventricular function due to recovery of anteroapical contractility. Increase aoertic valve gradients are due to improved LV function, not due to worsening stenosis.  Cardiac cath 10/30:  Ost LM to Dist LM lesion is 95% stenosed.  Colon Flattery  LAD lesion is 90% stenosed.  Ost Cx to Prox Cx lesion is 80% stenosed.  Prox RCA lesion is 100% stenosed.  There is moderate left ventricular systolic dysfunction.  LV end diastolic pressure is moderately elevated.  The left ventricular ejection fraction is 35-45% by visual estimate.  Ost 2nd Mrg to 2nd Mrg lesion is 50% stenosed.  1. Critical stenosis of the distal left mainstem extending into the proximal LAD and circumflex 2. Chronic total occlusion of the RCA collateralized by right to right and left to right collaterals 3. Moderate segmental LV systolic dysfunction 4. Moderate aortic stenosis with mean transvalvular gradient 16 mmHg and calculated aortic valve area 0.95 cm 5. Reduced cardiac output by Fick measurement  Recommend: Hospital admission, IV heparin, consider revascularization options of multivessel CABG versus hemodynamically supported PCI. PCI would likely come with prohibitive risk if the patient cannot be hemodynamically supported. Even with support her risk will be increased with heavy calcification and need to perform atherectomy of the left mainstem in the presence of an occluded RCA. Patient Profile     84 y.o. female chronic systolic heart failure, multi-vessel cad,  diabetes, pad, aortic stenosis with recent dyspnea, fatigue and chest discomfort with activity. Outpatient cardiac performed 10/30 showing 95% stenosis ost lm to dist lm, 100% stenosed prox rca, 90% stenosed ost lad, 80% stensosed ost cx to prox cx. Patient was hospitalized for treatment of severe multivessel stenosis.  Assessment & Plan    1. CAD with unstable angina:  Coronary atherectomy and impella device insertion yesterday 02/13/2018 which was done successfully with left main bifurcation stenting and adjuvant atherectomy.  An Impella device was used to maintain hemodynamic support.  -Continue aspirin 81 mg daily and clopidogrel 75 mg for at least 12 months -Continue carvedilol 6.25 mg twice daily -Continue losartan 25 mg daily -Continue atorvastatin 40 mg daily -Work with physical therapy-the patient mentioned that she would get chest pressure and dyspnea when moving from one room to another  2. Ischemic cardiomyopathy:LVEF improved to 55-60% from 20-25% on prior echo in Nov 2018.  -Continue carvedilol 6.25mg  bid, losartan 25mg  qd -Continue Lasix 40 mg daily -1.5L output over the past 24 hours  3. PAF: CHADSVASC 5, sinus rhythm noted. continue carvedilol 6.25mg  bid  4. Aortic Stenosis: Patient not candidate for aortic valve replacement. Will continue to monitor   Lars Mage, MD Internal Medicine PGY2 [id number]:[phone] 02/14/2018, 8:30 AM    Patient seen and examined. Agree with assessment and plan.  Angiogram was reviewed at length and discussed with housestaff.  Excellent result in a very complex procedure.  She complains of mild soreness at the right radial site.  Both groin sites are stable without hematoma.  We will ambulate today.  Continue carvedilol, and losartan.  Currently on furosemide.  May ultimately be able to have this dose reduced.  Will keep in hospital today and if patient remains stable plan for probable discharge tomorrow. Creatinine stable; will recheck in  a.m.   Troy Sine, MD, [facility] 02/14/2018 1:28 PM

## 2018-02-15 LAB — BASIC METABOLIC PANEL
Anion gap: 9 (ref 5–15)
BUN: 25 mg/dL — AB (ref 8–23)
CHLORIDE: 107 mmol/L (ref 98–111)
CO2: 20 mmol/L — ABNORMAL LOW (ref 22–32)
CREATININE: 0.93 mg/dL (ref 0.44–1.00)
Calcium: 8.9 mg/dL (ref 8.9–10.3)
GFR calc Af Amer: >60 mL/min (ref >60)
GFR calc non Af Amer: 54 mL/min — ABNORMAL LOW (ref >60)
GLUCOSE: 147 mg/dL — AB (ref 70–99)
Potassium: 4.3 mmol/L (ref 3.5–5.1)
SODIUM: 136 mmol/L (ref 135–145)

## 2018-02-15 LAB — CBC
HCT: 34.5 % — ABNORMAL LOW (ref 36.0–46.0)
HEMOGLOBIN: 11.1 g/dL — AB (ref 12.0–15.0)
MCH: 26.9 pg (ref 26.0–34.0)
MCHC: 32.2 g/dL (ref 30.0–36.0)
MCV: 83.5 fL (ref 80.0–100.0)
Platelets: 189 10*3/uL (ref 150–400)
RBC: 4.13 MIL/uL (ref 3.87–5.11)
RDW: 15.1 % (ref 11.5–15.5)
WBC: 11.3 10*3/uL — ABNORMAL HIGH (ref 4.0–10.5)
nRBC: 0 % (ref 0.0–0.2)

## 2018-02-15 LAB — GLUCOSE, CAPILLARY: GLUCOSE-CAPILLARY: 159 mg/dL — AB (ref 70–99)

## 2018-02-15 MED ORDER — NITROGLYCERIN 0.4 MG SL SUBL: 0.4000 mg | SUBLINGUAL_TABLET | SUBLINGUAL | 2 refills | Status: DC | PRN

## 2018-02-15 MED ORDER — PANTOPRAZOLE SODIUM 40 MG PO TBEC: 40.0000 mg | DELAYED_RELEASE_TABLET | Freq: Every day | ORAL | 1 refills | Status: DC

## 2018-02-15 MED ORDER — LOSARTAN POTASSIUM 25 MG PO TABS: 25.0000 mg | ORAL_TABLET | Freq: Every day | ORAL | 1 refills | Status: DC

## 2018-02-15 MED ORDER — CLOPIDOGREL BISULFATE 75 MG PO TABS: 75.0000 mg | ORAL_TABLET | Freq: Every day | ORAL | 2 refills | Status: DC

## 2018-02-15 NOTE — Progress Notes (Addendum)
Progress Note  Patient Name: [name] Date of Encounter: 02/15/2018  Primary Cardiologist: Dr. Sherren Mocha, MD   Subjective   Pt feeling well today. Reports breathing is improved. Denies chest pain. Cath sites unremarkable   Inpatient Medications    Scheduled Meds: . aspirin EC  81 mg Oral Daily  . atorvastatin  40 mg Oral q1800  . calcium carbonate  1 tablet Oral BID  . carvedilol  6.25 mg Oral BID WC  . clopidogrel  75 mg Oral Daily  . furosemide  40 mg Oral Daily  . insulin aspart  0-15 Units Subcutaneous TID WC  . insulin aspart  4 Units Subcutaneous TID WC  . losartan  25 mg Oral Daily  . pantoprazole  40 mg Oral Daily  . potassium chloride  10 mEq Oral Daily  . sodium chloride flush  3 mL Intravenous Q12H  . sodium chloride flush  3 mL Intravenous Q12H   Continuous Infusions: . sodium chloride    . sodium chloride     PRN Meds: sodium chloride, sodium chloride, acetaminophen, acetaminophen, ondansetron (ZOFRAN) IV, sodium chloride flush, sodium chloride flush   Vital Signs    Vitals:   02/14/18 1549 02/14/18 1600 02/14/18 1932 02/15/18 0431  BP:  (!) 103/55 116/64 (!) 111/57  Pulse:      Resp:  (!) 22    Temp: 98 F (36.7 C)  98.5 F (36.9 C) 97.6 F (36.4 C)  TempSrc: Oral  Oral Oral  SpO2:  92%    Weight:    63.3 kg  Height:        Intake/Output Summary (Last 24 hours) at 02/15/2018 0647 Last data filed at 02/14/2018 1700 Gross per 24 hour  Intake 600 ml  Output 600 ml  Net 0 ml   Filed Weights   02/07/18 1313 02/13/18 0534 02/15/18 0431  Weight: 65.8 kg 63.3 kg 63.3 kg    Physical Exam   General: Well developed, well nourished, NAD Skin: Warm, dry, intact  Head: Normocephalic, atraumatic, sclera non-icteric, no xanthomas, clear, moist mucus membranes. Neck: Negative for carotid bruits. No JVD Lungs:Clear to ausculation bilaterally. No wheezes, rales, or rhonchi. Breathing is unlabored. Cardiovascular: RRR with S1 S2. +  murmur. No rubs, gallops, or LV heave appreciated. Abdomen: Soft, non-tender, non-distended with normoactive bowel sounds.No obvious abdominal masses. MSK: Strength and tone appear normal for age. 5/5 in all extremities Extremities: No edema. No clubbing or cyanosis. DP/PT pulses 2+ bilaterally Neuro: Alert and oriented. No focal deficits. No facial asymmetry. MAE spontaneously. Psych: Responds to questions appropriately with normal affect.    Labs    Chemistry Recent Labs  Lab 02/13/18 0531 02/14/18 0445 02/15/18 0359  NA 133* 135 136  K 4.3 4.4 4.3  CL 102 109 107  CO2 23 23 20*  GLUCOSE 136* 148* 147*  BUN 34* 23 25*  CREATININE 1.18* 0.85 0.93  CALCIUM 9.2 8.9 8.9  GFRNONAA 41* >60 54*  GFRAA 47* >60 >60  ANIONGAP 8 3* 9     Hematology Recent Labs  Lab 02/13/18 0259 02/14/18 0445 02/15/18 0359  WBC 14.0* 13.0* 11.3*  RBC 4.82 4.28 4.13  HGB 13.0 11.2* 11.1*  HCT 39.7 35.7* 34.5*  MCV 82.4 83.4 83.5  MCH 27.0 26.2 26.9  MCHC 32.7 31.4 32.2  RDW 14.8 15.0 15.1  PLT 212 208 189   Cardiac EnzymesNo results for input(s): TROPONINI in the last 168 hours. No results for input(s): TROPIPOC in  the last 168 hours.   BNPNo results for input(s): BNP, PROBNP in the last 168 hours.   DDimer No results for input(s): DDIMER in the last 168 hours.   Radiology    No results found.  Telemetry    02/15/18 NSR - Personally Reviewed  ECG    No new tracing as of 02/15/2018 - Personally Reviewed  Cardiac Studies   Coronary arthrectomy 02/13/2018  Ost LM to Dist LM lesion is 95% stenosed.  Ost LAD lesion is 90% stenosed.  Ost Cx to Prox Cx lesion is 80% stenosed.  Ost 2nd Mrg to 2nd Mrg lesion is 50% stenosed.  Prox RCA lesion is 100% stenosed.  A drug-eluting stent was successfully placed using a STENT SIERRA 3.00 X 18 MM.  Post intervention, there is a 0% residual stenosis.  Post intervention, there is a 0% residual stenosis.  A stent was successfully  placed.  Post intervention, there is a 0% residual stenosis.  CT Angio (10/31): IMPRESSION: VASCULAR  1. Abdominal aortic atherosclerosis without evidence of aneurysm or stenosis. 2. Accessory lower pole renal artery on the left. 3. Right lower extremity: No significant obstructive disease involving the common or external iliac arteries. Significant disease of the internal iliac artery. Roughly 50-60% focal stenosis of the right common femoral artery. Occluded proximal right SFA. 4. Left lower extremity: No significant obstructive disease involving the common or external iliac arteries. Probable near-total occlusive disease of the proximal left internal iliac artery. No significant stenosis of the left common femoral artery. Diffuse high-grade stenosis of the proximal left SFA.  NON-VASCULAR  1. 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 The right renal pelvis does appear slightly more dilated than on the prior CT in 2018. This may not be of any clinical significance if renal function is normal and the patient is asymptomatic. 2. Tiny umbilical hernia containing fat.  TTE (01/25/18): Study Conclusions  - Left ventricle: The cavity size was normal. Wall thickness was normal. Systolic function was normal. The estimated ejection fraction was in the range of 55% to 60%. Wall motion was normal; there were no regional wall motion abnormalities. Left ventricular diastolic function parameters were normal. - Aortic valve: Valve mobility was restricted. There was moderate stenosis. Valve area (VTI): 1.69 cm^2. Valve area (Vmax): 1.01 cm^2. Valve area (Vmean): 1.72 cm^2. - Mitral valve: Calcified annulus. Valve area by pressure half-time: 2 cm^2. Valve area by continuity equation (using LVOT flow): 2.82 cm^2. - Left atrium: The atrium was mildly dilated.  Impressions:  - Compared to November 2018, there is  substantial improvement in left ventricular function due to recovery of anteroapical contractility. Increase aoertic valve gradients are due to improved LV function, not due to worsening stenosis.  Cardiac cath 10/30:  Ost LM to Dist LM lesion is 95% stenosed.  Ost LAD lesion is 90% stenosed.  Ost Cx to Prox Cx lesion is 80% stenosed.  Prox RCA lesion is 100% stenosed.  There is moderate left ventricular systolic dysfunction.  LV end diastolic pressure is moderately elevated.  The left ventricular ejection fraction is 35-45% by visual estimate.  Ost 2nd Mrg to 2nd Mrg lesion is 50% stenosed.  1. Critical stenosis of the distal left mainstem extending into the proximal LAD and circumflex 2. Chronic total occlusion of the RCA collateralized by right to right and left to right collaterals 3. Moderate segmental LV systolic dysfunction 4. Moderate aortic stenosis with mean transvalvular gradient 16 mmHg and calculated aortic valve  area 0.95 cm 5. Reduced cardiac output by Fick measurement  Recommend: Hospital admission, IV heparin, consider revascularization options of multivessel CABG versus hemodynamically supported PCI. PCI would likely come with prohibitive risk if the patient cannot be hemodynamically supported. Even with support her risk will be increased with heavy calcification and need to perform atherectomy of the left mainstem in the presence of an occluded RCA.  Patient Profile     84 y.o. female with a hx of chronic systolic heart failure, multi-vessel cad, diabetes, pad, aortic stenosis with recent dyspnea, fatigue and chest discomfort with activity. Outpatient cardiac performed 10/30 showing 95% stenosis ost lm to dist lm, 100% stenosed prox rca, 90% stenosed ost lad, 80% stensosed ost cx to prox cx. Patient was hospitalized for treatment of severe multivessel stenosis.  Assessment & Plan    1. CAD with unstable angina: -Complex coronary arthrectomy  and Impella device insertion 02/13/2018 with successful left main bifurcation stenting and adjuvant arthrectomy.  Patient initially placed on CCU and transferred to telemetry 02/14/2018 -Right radial and groin sites unremarkable -Continue ASA, clopidogrel for a minimum of 12 months duration -Continue losartan, atorvastatin -Ambulated with PT without complication  2. Ischemic cardiomyopathy: -LVEF down to be improved to 55 to 60% from 20 to 25% on prior echocardiogram 02/2017 -Continue carvedilol, losartan -Continue Lasix  -Weight, 139.5lb today, 145lb on admission -I&O, net -766 mL  3.  History of PAF: -Maintaining NSR>> continue carvedilol -Not currently on anticoagulation  -CHA2DS2VASc =5  4. Aortic Stenosis: -Not a candidate for AV replacement  -Moderate stenosis per echocardiogram 01/25/18   Signed, Kathyrn Drown NP-C [facility] Pager: [phone] 02/15/2018, 6:47 AM     Patient seen and examined. Agree with assessment and plan.  Patient feels well, 2 days status post complex PCI/atherectomy to left main, LAD and circumflex.  Able now to walk in the hallway without previous significant dyspnea.  No recurrent chest pain.    Recommend continuation of dual antiplatelet therapy indefinitely.  Patient advised to switch from omeprazole to pantoprazole for GERD with concomitant Plavix administration. Okay for discharge today.   Troy Sine, MD, [facility] 02/15/2018 9:05 AM   For questions or updates, please contact   Please consult [email or web address] for contact info under Cardiology/STEMI.

## 2018-02-15 NOTE — Discharge Instructions (Signed)
Information about your medication: Plavix (anti-platelet agent)  Generic Name (Brand): clopidogrel (Plavix), once daily medication  PURPOSE: You are taking this medication along with aspirin to lower your chance of having a heart attack, stroke, or blood clots in your heart stent. These can be fatal. Brilinta and aspirin help prevent platelets from sticking together and forming a clot that can block an artery or your stent.   Common SIDE EFFECTS you may experience include: bruising or bleeding more easily, shortness of breath  Do not stop taking PLAVIX without talking to the doctor who prescribes it for you. People who are treated with a stent and stop taking Plavix too soon, have a higher risk of getting a blood clot in the stent, having a heart attack, or dying. If you stop Plavix because of bleeding, or for other reasons, your risk of a heart attack or stroke may increase.   Tell all of your doctors and dentists that you are taking Plavix. They should talk to the doctor who prescribed Brilinta for you before you have any surgery or invasive procedure.   Contact your health care provider if you experience: severe or uncontrollable bleeding, pink/red/brown urine, vomiting blood or vomit that looks like "coffee grounds", red or black stools (looks like tar), coughing up blood or blood clots ----------------------------------------------------------------------------------------------------------------------

## 2018-02-15 NOTE — Progress Notes (Signed)
CARDIAC REHAB PHASE I   PRE:  Rate/Rhythm: 71 SR  BP:  Supine: 101/56  Sitting:   Standing:    SaO2: 94%RA  MODE:  Ambulation: 400 ft   POST:  Rate/Rhythm: 91SR  BP:  Supine:   Sitting: 130/55  Standing:    SaO2: 97%RA 0955-1015 Pt walked 400 ft on RA with gait belt use and rolling walker with steady gait. Tolerated well. No CP. Ready to go home.   Graylon Good, RN BSN  02/15/2018 10:12 AM

## 2018-02-15 NOTE — Discharge Summary (Signed)
Discharge Summary    Patient ID: [name],  MRN: [id number], DOB/AGE: [birth year] 82 y.o.  Admit date: 02/07/2018 Discharge date: 02/15/2018  Primary Care Provider: Jinny [name] Primary Cardiologist: Dr. Burt [name]   Discharge Diagnoses    Principal Problem:   Acute on chronic combined systolic and diastolic CHF (congestive heart failure) [facility]) Active Problems:   Left main coronary artery disease   PAF (paroxysmal atrial fibrillation) ([place])   Aortic stenosis   Coronary artery disease due to calcified coronary lesion   Coronary artery disease involving native coronary artery of native heart with unstable angina pectoris (HCC)   Allergies Allergies  Allergen Reactions  . Metformin     unspecified  . Penicillins     unspecified Has patient had a PCN reaction causing immediate rash, facial/tongue/throat swelling, SOB or lightheadedness with hypotension: Unknown Has patient had a PCN reaction causing severe rash involving mucus membranes or skin necrosis: Unknown Has patient had a PCN reaction that required hospitalization: No Has patient had a PCN reaction occurring within the last 10 years: No If all of the above answers are "NO", then may proceed with Cephalosporin use.   . Codeine Palpitations    Rapid heart rate  . Fenofibrate Palpitations  . Pioglitazone Rash    Diagnostic Studies/Procedures    Cath: 02/07/18   Ost LM to Dist LM lesion is 95% stenosed.  Ost LAD lesion is 90% stenosed.  Ost Cx to Prox Cx lesion is 80% stenosed.  Prox RCA lesion is 100% stenosed.  There is moderate left ventricular systolic dysfunction.  LV end diastolic pressure is moderately elevated.  The left ventricular ejection fraction is 35-45% by visual estimate.  Ost 2nd Mrg to 2nd Mrg lesion is 50% stenosed.   1.  Critical stenosis of the distal left mainstem extending into the proximal LAD and circumflex 2.  Chronic total occlusion of the RCA collateralized by right  to right and left to right collaterals 3.  Moderate segmental LV systolic dysfunction 4.  Moderate aortic stenosis with mean transvalvular gradient 16 mmHg and calculated aortic valve area 0.95 cm 5.  Reduced cardiac output by Fick measurement  Recommend: Hospital admission, IV heparin, consider revascularization options of multivessel CABG versus hemodynamically supported PCI.  PCI would likely come with prohibitive risk if the patient cannot be hemodynamically supported.  Even with support her risk will be increased with heavy calcification and need to perform atherectomy of the left mainstem in the presence of an occluded RCA.  Discussed at length with the patient and her son. Appreciate cardiac surgical consultation.  Cath: 02/13/18   Ost LM to Dist LM lesion is 95% stenosed.  Ost LAD lesion is 90% stenosed.  Ost Cx to Prox Cx lesion is 80% stenosed.  Ost 2nd Mrg to 2nd Mrg lesion is 50% stenosed.  Prox RCA lesion is 100% stenosed.  A drug-eluting stent was successfully placed using a STENT SIERRA 3.00 X 18 MM.  Post intervention, there is a 0% residual stenosis.  Post intervention, there is a 0% residual stenosis.  A stent was successfully placed.  Post intervention, there is a 0% residual stenosis.   Successful complex left main bifurcation stenting (Culotte technique) with adjuvant atherectomy and hemodynamic support with an Impella CP device.   Recommend dual antiplatelet therapy with Aspirin 81mg  daily and Clopidogrel 75mg  daily long-term (beyond 12 months) because of complex left main stenting - 12 months DAPT minimum favor long-term if tolerated). _____________  History of Present Illness     84 y.o. female with a hx of CHF, PAD, diabetes, and aortic stenosis, who presented to the office for follow-up evaluation.   She had not been seen in several years. She was hospitalized in December 2018 after a mechanical fall and right hip fracture. During the  hospitalization, she suffered a NSTEMI and was also noted to have severe LV dysfunction and heart failure. Conservative management was recommended in the setting of anemia, advanced age, and hip fracture.   She reported having problems with persistent shortness of breath and chest discomfort with activity. She said several times that she 'just can't catch her breath' much of the time. She also complained of central chest pressure with ambulation or physical activities. She denied resting chest pain. She complained of orthopnea but denied PND. She also complained of marked fatigue. Given her symptoms she was set up for outpatient cardiac cath.   Hospital Course     Consultants: TCTS  Underwent cardiac cath noted above with critical stenosis in the distal LM extending into the pLAD and LCx. Also total occlusion of the RCA with collaterals with moderate LV dysfunction. She was admitted to the hospital, started on IV heparin and TCTS consulted in regards to possible CABG vs PCI support. She was seen by Dr. Servando [name] and determined not to be a candidate for CABG as she would likely have a poor outcome in regards to recovery. She was continued on IV heparin, ASA, plavix, BB and statin.   1. CAD with unstable angina: -Complex coronary arthrectomy and Impella device insertion 02/13/2018 with successful left main bifurcation stenting and adjuvant arthrectomy.  Patient initially placed on CCU and transferred to telemetry 02/14/2018 -Right radial and groin sites unremarkable -Continue ASA, clopidogrel for a minimum of 12 months duration -Continue losartan, atorvastatin -Ambulated with PT without complication  2. Ischemic cardiomyopathy: -LVEF down to be improved to 55 to 60% from 20 to 25% on prior echocardiogram 02/2017 -Continue carvedilol, losartan -Continue Lasix  -Weight, 139.5lb today, 145lb on admission -I&O, net -766 mL  3.  History of PAF: -Maintaining NSR>> continue carvedilol -Not  currently on anticoagulation  -CHA2DS2VASc =5  4. Aortic Stenosis: -Not a candidate for AV replacement  -Moderate stenosis per echocardiogram 01/25/18   [name] was seen by Dr. Claiborne [name] and determined stable for discharge home. Follow up in the office has been arranged. Medications are listed below.   _____________  Discharge Vitals Blood pressure (!) 111/57, pulse 71, temperature 97.6 F (36.4 C), temperature source Oral, resp. rate (!) 22, height 5\' 3"  (1.6 m), weight 63.3 kg, SpO2 92 %.  Filed Weights   02/07/18 1313 02/13/18 0534 02/15/18 0431  Weight: 65.8 kg 63.3 kg 63.3 kg    Labs & Radiologic Studies    CBC Recent Labs    02/14/18 0445 02/15/18 0359  WBC 13.0* 11.3*  HGB 11.2* 11.1*  HCT 35.7* 34.5*  MCV 83.4 83.5  PLT 208 546   Basic Metabolic Panel Recent Labs    02/14/18 0445 02/15/18 0359  NA 135 136  K 4.4 4.3  CL 109 107  CO2 23 20*  GLUCOSE 148* 147*  BUN 23 25*  CREATININE 0.85 0.93  CALCIUM 8.9 8.9   Liver Function Tests No results for input(s): AST, ALT, ALKPHOS, BILITOT, PROT, ALBUMIN in the last 72 hours. No results for input(s): LIPASE, AMYLASE in the last 72 hours. Cardiac Enzymes No results for input(s): CKTOTAL, CKMB, CKMBINDEX, TROPONINI in the  last 72 hours. BNP Invalid input(s): POCBNP D-Dimer No results for input(s): DDIMER in the last 72 hours. Hemoglobin A1C No results for input(s): HGBA1C in the last 72 hours. Fasting Lipid Panel Recent Labs    02/12/18 1410  CHOL 137  HDL 48  LDLCALC 52  TRIG 187*  CHOLHDL 2.9   Thyroid Function Tests No results for input(s): TSH, T4TOTAL, T3FREE, THYROIDAB in the last 72 hours.  Invalid input(s): FREET3 _____________  Vas [place] Abi With/wo Tbi  Result Date: 01/18/2018 LOWER EXTREMITY DOPPLER STUDY Indications: Peripheral artery disease. High Risk         Hypertension, hyperlipidemia, Diabetes, coronary artery Factors:          disease.  Performing Technologist: Ronal Fear RVS, RCS  Examination Guidelines: A complete evaluation includes at minimum, Doppler waveform signals and systolic blood pressure reading at the level of bilateral brachial, anterior tibial, and posterior tibial arteries, when vessel segments are accessible. Bilateral testing is considered an integral part of a complete examination. Photoelectric Plethysmograph (PPG) waveforms and toe systolic pressure readings are included as required and additional duplex testing as needed. Limited examinations for reoccurring indications may be performed as noted.  ABI Findings: +---------+------------------+-----+----------+--------+ Right    Rt Pressure (mmHg)IndexWaveform  Comment  +---------+------------------+-----+----------+--------+ Brachial 152                                       +---------+------------------+-----+----------+--------+ PTA                             absent             +---------+------------------+-----+----------+--------+ DP       74                0.49 monophasic         +---------+------------------+-----+----------+--------+ Great Toe41                0.27                    +---------+------------------+-----+----------+--------+ +---------+------------------+-----+----------+-------+ Left     Lt Pressure (mmHg)IndexWaveform  Comment +---------+------------------+-----+----------+-------+ Brachial 150                                      +---------+------------------+-----+----------+-------+ PTA      74                0.49 monophasic        +---------+------------------+-----+----------+-------+ DP       78                0.51 monophasic        +---------+------------------+-----+----------+-------+ Great Toe63                0.41                   +---------+------------------+-----+----------+-------+ +-------+-----------+-----------+------------+------------+ ABI/TBIToday's ABIToday's TBIPrevious ABIPrevious TBI  +-------+-----------+-----------+------------+------------+ Right  0.49       0.27       0.42        0.48         +-------+-----------+-----------+------------+------------+ Left   0.51       0.41       0.48        0.00         +-------+-----------+-----------+------------+------------+  Bilateral ABIs appear essentially unchanged.  Summary: Right: Resting right ankle-brachial index indicates severe right lower extremity arterial disease. The right toe-brachial index is abnormal. Left: Resting left ankle-brachial index indicates moderate left lower extremity arterial disease. The left toe-brachial index is abnormal.  *See table(s) above for measurements and observations.  Electronically signed by Ruta Hinds MD on 01/18/2018 at 5:01:38 PM.   Final    Ct Angio Abd/pel W/ And/or W/o  Result Date: 02/08/2018 CLINICAL DATA:  Evaluation of arterial vasculature prior to planned coronary intervention EXAM: CT ANGIOGRAPHY ABDOMEN AND PELVIS WITH CONTRAST TECHNIQUE: Multidetector CT imaging of the abdomen and pelvis was performed using the standard protocol during bolus administration of intravenous contrast. Multiplanar reconstructed images and MIPs were obtained and reviewed to evaluate the vascular anatomy. CONTRAST:  120mL ISOVUE-370 IOPAMIDOL (ISOVUE-370) INJECTION 76% COMPARISON:  CT of the abdomen and pelvis without contrast on 03/06/2017 FINDINGS: VASCULAR Aorta: Diffuse atherosclerosis of the abdominal aorta with both calcified and noncalcified plaque present. No evidence of abdominal aortic aneurysm or focal aortic stenosis. No dissection. Celiac: Normally patent.  Normal branch vessel anatomy. SMA: Normally patent. Renals: Single right renal artery with eccentric calcified plaque at its origin not causing significant stenosis. Two separate left renal arteries with larger superior dominant renal artery and a small lower pole accessory renal artery. IMA: Normally patent. Inflow: Bilateral common  iliac arteries show diffuse calcified plaque without evidence of aneurysm or focal stenosis. Bilateral internal iliac arteries show diffuse calcified plaque. The proximal left internal iliac artery is more heavily diseased and likely nearly occluded. Distal branch vessels are open in the internal iliac distribution bilaterally. External iliac arteries demonstrate no significant obstructive disease bilaterally or evidence of aneurysmal disease. Proximal Outflow: Bilateral common femoral arteries demonstrate heavily calcified plaque. Mid right common femoral artery demonstrates roughly 50-60% stenosis. The proximal right SFA is occluded. The proximal right profunda femoral artery is patent. The left common femoral artery shows no significant stenosis. The proximal left SFA shows heavily calcified plaque and diffuse high-grade disease. The profunda femoral artery is open. Review of the MIP images confirms the above findings. NON-VASCULAR Lower chest: Mild scarring at both lung bases. Hepatobiliary: No focal liver abnormality is seen. No gallstones, gallbladder wall thickening, or biliary dilatation. Pancreas: Unremarkable. No pancreatic ductal dilatation or surrounding inflammatory changes. Spleen: Normal in size without focal abnormality. Adrenals/Urinary Tract: Chronic dilated extrarenal pelvis bilaterally, right greater than left. There may be a component of chronic right UPJ stenosis/obstruction. The right renal pelvis appears slightly more dilated compared to the 2018 CT. No adrenal masses. The bladder is unremarkable. Stomach/Bowel: Bowel shows no evidence of obstruction, ileus or inflammation. The appendix appears normal. No free air identified. Lymphatic: No enlarged lymph nodes identified. Reproductive: Status post hysterectomy. No adnexal masses. Other: Tiny umbilical hernia containing fat. No abdominopelvic ascites. Musculoskeletal: No acute or significant osseous findings. IMPRESSION: VASCULAR 1. Abdominal  aortic atherosclerosis without evidence of aneurysm or stenosis. 2. Accessory lower pole renal artery on the left. 3. Right lower extremity: No significant obstructive disease involving the common or external iliac arteries. Significant disease of the internal iliac artery. Roughly 50-60% focal stenosis of the right common femoral artery. Occluded proximal right SFA. 4. Left lower extremity: No significant obstructive disease involving the common or external iliac arteries. Probable near-total occlusive disease of the proximal left internal iliac artery. No significant stenosis of the left common femoral artery. Diffuse high-grade stenosis of the proximal left SFA. NON-VASCULAR 1. 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 The right renal pelvis does appear slightly more dilated than on the prior CT in 2018. This may not be of any clinical significance if renal function is normal and the patient is asymptomatic. 2. Tiny umbilical hernia containing fat. Electronically Signed   By: Aletta Edouard M.D.   On: 02/08/2018 17:21   Disposition   Pt is being discharged home today in good condition.  Follow-up Plans & Appointments    Follow-up Information    Imogene Burn, PA-C Follow up on 02/21/2018.   Specialty:  Cardiology Why:  at 11:30am for your follow up appt. Contact information: [address] [place] [address] [phone]          Discharge Instructions    (HEART FAILURE PATIENTS) Call MD:  Anytime you have any of the following symptoms: 1) 3 pound weight gain in 24 hours or 5 pounds in 1 week 2) shortness of breath, with or without a dry hacking cough 3) swelling in the hands, feet or stomach 4) if you have to sleep on extra pillows at night in order to breathe.   Complete by:  As directed    Amb Referral to Cardiac Rehabilitation   Complete by:  As directed    Diagnosis:   Heart Failure (see criteria below if ordering  Phase II) Coronary Stents     Heart Failure Type:  Chronic Systolic   Call MD for:  redness, tenderness, or signs of infection (pain, swelling, redness, odor or green/yellow discharge around incision site)   Complete by:  As directed    Diet - low sodium heart healthy   Complete by:  As directed    Discharge instructions   Complete by:  As directed    Groin Site Care Refer to this sheet in the next few weeks. These instructions provide you with information on caring for yourself after your procedure. Your caregiver may also give you more specific instructions. Your treatment has been planned according to current medical practices, but problems sometimes occur. Call your caregiver if you have any problems or questions after your procedure. HOME CARE INSTRUCTIONS You may shower 24 hours after the procedure. Remove the bandage (dressing) and gently wash the site with plain soap and water. Gently pat the site dry.  Do not apply powder or lotion to the site.  Do not sit in a bathtub, swimming pool, or whirlpool for 5 to 7 days.  No bending, squatting, or lifting anything over 10 pounds (4.5 kg) as directed by your caregiver.  Inspect the site at least twice daily.  Do not drive home if you are discharged the same day of the procedure. Have someone else drive you.  You may drive 24 hours after the procedure unless otherwise instructed by your caregiver.  What to expect: Any bruising will usually fade within 1 to 2 weeks.  Blood that collects in the tissue (hematoma) may be painful to the touch. It should usually decrease in size and tenderness within 1 to 2 weeks.  SEEK IMMEDIATE MEDICAL CARE IF: You have unusual pain at the groin site or down the affected leg.  You have redness, warmth, swelling, or pain at the groin site.  You have drainage (other than a small amount of blood on the dressing).  You have chills.  You have a fever or persistent symptoms for more than 72 hours.  You have a  fever and your symptoms suddenly get worse.  Your leg becomes pale, cool, tingly, or numb.  You have heavy bleeding from the site. Hold pressure on the site. Marland Kitchen  PLEASE DO NOT MISS ANY DOSES OF YOUR PLAVIX!!!!! Also keep a log of you blood pressures and bring back to your follow up appt. Please call the office with any questions.   Patients taking blood thinners should generally stay away from medicines like ibuprofen, Advil, Motrin, naproxen, and Aleve due to risk of stomach bleeding. You may take Tylenol as directed or talk to your primary doctor about alternatives.  Some studies suggest Prilosec/Omeprazole interacts with Plavix. We changed your Prilosec/Omeprazole to the equivalent dose of Protonix for less chance of interaction.   Increase activity slowly   Complete by:  As directed        Discharge Medications     Medication List    STOP taking these medications   omeprazole 20 MG capsule Commonly known as:  PRILOSEC Replaced by:  pantoprazole 40 MG tablet     TAKE these medications   acetaminophen 500 MG tablet Commonly known as:  TYLENOL Take 1,000 mg by mouth at bedtime as needed for moderate pain.   aspirin 81 MG tablet Take 81 mg by mouth daily.   atorvastatin 40 MG tablet Commonly known as:  LIPITOR Take 1 tablet (40 mg total) by mouth daily at 6 PM.   carvedilol 6.25 MG tablet Commonly known as:  COREG TAKE 1 TABLET BY MOUTH TWICE DAILY WITH MEALS   clopidogrel 75 MG tablet Commonly known as:  PLAVIX Take 1 tablet (75 mg total) by mouth daily.   furosemide 40 MG tablet Commonly known as:  LASIX Take 1 tablet (40 mg total) by mouth daily.   insulin NPH Human 100 UNIT/ML injection Commonly known as:  HUMULIN N,NOVOLIN N Inject 15 Units into the skin at bedtime.   losartan 25 MG tablet Commonly known as:  COZAAR Take 1 tablet (25 mg total) by mouth daily.   nitroGLYCERIN 0.4 MG SL tablet Commonly known as:  NITROSTAT Place 1 tablet (0.4 mg total)  under the tongue every 5 (five) minutes as needed.   NOVOLOG FLEXPEN 100 UNIT/ML FlexPen Generic drug:  insulin aspart INJECT 10 UNITS SUBCUTANEOUSLY THREE TIMES DAILY WITH MEALS What changed:  See the new instructions.   ONE TOUCH ULTRA TEST test strip Generic drug:  glucose blood USE 1 STRIP TO CHECK GLUCOSE 4 TIMES DAILY   pantoprazole 40 MG tablet Commonly known as:  PROTONIX Take 1 tablet (40 mg total) by mouth daily. Replaces:  omeprazole 20 MG capsule   potassium chloride 10 MEQ tablet Commonly known as:  K-DUR Take 1 tablet (10 mEq total) by mouth daily.   THERATEARS OP Place 1 drop into both eyes daily as needed (dry eyes).        Acute coronary syndrome (MI, NSTEMI, STEMI, etc) this admission?: Yes.     AHA/ACC Clinical Performance & Quality Measures: 1. Aspirin prescribed? - Yes 2. ADP Receptor Inhibitor (Plavix/Clopidogrel, Brilinta/Ticagrelor or Effient/Prasugrel) prescribed (includes medically managed patients)? - Yes 3. Beta Blocker prescribed? - Yes 4. High Intensity Statin (Lipitor 40-80mg  or Crestor 20-40mg ) prescribed? - Yes 5. EF assessed during THIS hospitalization? - Yes 6. For EF <40%, was ACEI/ARB prescribed? - Yes 7. For EF <40%, Aldosterone Antagonist (Spironolactone or Eplerenone) prescribed? - No - Reason:  consider as outpatient 8. Cardiac Rehab Phase II ordered (Included Medically managed Patients)? - Yes      Outstanding Labs/Studies   N/a  Duration of Discharge Encounter   Greater than 30 minutes including physician time.  Signed, Reino Bellis NP-C 02/15/2018, 9:46 AM

## 2018-02-16 ENCOUNTER — Telehealth (HOSPITAL_COMMUNITY): Payer: Self-pay

## 2018-02-16 NOTE — Telephone Encounter (Signed)
Pt was seen by phrase I and states she is not interested.  Closed referral

## 2018-02-21 ENCOUNTER — Ambulatory Visit: Payer: PPO | Admitting: Physician Assistant

## 2018-02-21 ENCOUNTER — Encounter: Payer: Self-pay | Admitting: Physician Assistant

## 2018-02-21 ENCOUNTER — Ambulatory Visit (INDEPENDENT_AMBULATORY_CARE_PROVIDER_SITE_OTHER): Payer: PPO

## 2018-02-21 VITALS — BP 114/60 | HR 70 | Ht 63.0 in | Wt 141.1 lb

## 2018-02-21 DIAGNOSIS — I480 Paroxysmal atrial fibrillation: Secondary | ICD-10-CM

## 2018-02-21 DIAGNOSIS — I2510 Atherosclerotic heart disease of native coronary artery without angina pectoris: Secondary | ICD-10-CM

## 2018-02-21 DIAGNOSIS — I5042 Chronic combined systolic (congestive) and diastolic (congestive) heart failure: Secondary | ICD-10-CM

## 2018-02-21 DIAGNOSIS — I2584 Coronary atherosclerosis due to calcified coronary lesion: Secondary | ICD-10-CM | POA: Diagnosis not present

## 2018-02-21 DIAGNOSIS — E785 Hyperlipidemia, unspecified: Secondary | ICD-10-CM

## 2018-02-21 DIAGNOSIS — I350 Nonrheumatic aortic (valve) stenosis: Secondary | ICD-10-CM

## 2018-02-21 NOTE — Patient Instructions (Signed)
Medication Instructions:  Your physician recommends that you continue on your current medications as directed. Please refer to the Current Medication list given to you today.  If you need a refill on your cardiac medications before your next appointment, please call your pharmacy.   Lab work: None ordered  If you have labs (blood work) drawn today and your tests are completely normal, you will receive your results only by: [facility] MyChart Message (if you have MyChart) OR . A paper copy in the mail If you have any lab test that is abnormal or we need to change your treatment, we will call you to review the results.  Testing/Procedures: Your physician has recommended that you wear an event monitor. Event monitors are medical devices that record the heart's electrical activity. Doctors most often [place] these monitors to diagnose arrhythmias. Arrhythmias are problems with the speed or rhythm of the heartbeat. The monitor is a small, portable device. You can wear one while you do your normal daily activities. This is usually used to diagnose what is causing palpitations/syncope (passing out).    Follow-Up: Your physician recommends that you schedule a follow-up appointment in: [place]     Cardiac Event Monitoring A cardiac event monitor is a small recording device that is used to detect abnormal heart rhythms (arrhythmias). The monitor is used to record your heart rhythm when you have symptoms, such as:  Fast heartbeats (palpitations), such as heart racing or fluttering.  Dizziness.  Fainting or light-headedness.  Unexplained weakness.  Some monitors are wired to electrodes placed on your chest. Electrodes are flat, sticky disks that attach to your skin. Other monitors may be hand-held or worn on the wrist. The monitor can be worn for up to 30 days. If the monitor is attached to your chest, a technician will prepare your chest for the electrode placement and show you how to  work the monitor. Take time to practice using the monitor before you leave the office. Make sure you understand how to send the information from the monitor to your health care provider. In some cases, you may need to use a landline telephone instead of a cell phone. What are the risks? Generally, this device is safe to use, but it possible that the skin under the electrodes will become irritated. How to use your cardiac event monitor  Wear your monitor at all times, except when you are in water: ? Do not let the monitor get wet. ? Take the monitor off when you bathe. Do not swim or use a hot tub with it on.  Keep your skin clean. Do not put body lotion or moisturizer on your chest.  Change the electrodes as told by your health care provider or any time they stop sticking to your skin. You may need to use medical tape to keep them on.  Try to put the electrodes in slightly different places on your chest to help prevent skin irritation. They must remain in the area under your left breast and in the upper right section of your chest.  Make sure the monitor is safely clipped to your clothing or in a location close to your body that your health care provider recommends.  Press the button to record as soon as you feel heart-related symptoms, such as: ? Dizziness. ? Weakness. ? Light-headedness. ? Palpitations. ? Thumping or pounding in your chest. ? Shortness of breath. ? Unexplained weakness.  Keep a diary of your activities, such as walking,  doing chores, and taking medicine. It is very important to note what you were doing when you pushed the button to record your symptoms. This will help your health care provider determine what might be contributing to your symptoms.  Send the recorded information as recommended by your health care provider. It may take some time for your health care provider to process the results.  Change the batteries as told by your health care provider.  Keep  electronic devices away from your monitor. This includes: ? Tablets. ? MP3 players. ? Cell phones.  While wearing your monitor you should avoid: ? Electric blankets. ? [employer] forces operational officer. ? Electric toothbrushes. ? Microwave ovens. ? Magnets. ? Metal detectors. Get help right away if:  You have chest pain.  You have extreme difficulty breathing or shortness of breath.  You develop a very fast heartbeat that persists.  You develop dizziness that does not go away.  You faint or constantly feel like you are about to faint. Summary  A cardiac event monitor is a small recording device that is used to help detect abnormal heart rhythms (arrhythmias).  The monitor is used to record your heart rhythm when you have heart-related symptoms.  Make sure you understand how to send the information from the monitor to your health care provider.  It is important to press the button on the monitor when you have any heart-related symptoms.  Keep a diary of your activities, such as walking, doing chores, and taking medicine. It is very important to note what you were doing when you pushed the button to record your symptoms. This will help your health care provider learn what might be causing your symptoms. This information is not intended to replace advice given to you by your health care provider. Make sure you discuss any questions you have with your health care provider. Document Released: 01/05/2008 Document Revised: 03/12/2016 Document Reviewed: 03/12/2016 [employer] Interactive Patient Education  2017 [employer].

## 2018-02-21 NOTE — Progress Notes (Signed)
Cardiology Office Note    Date:  02/21/2018   ID:  [name], DOB 07-03-1929, MRN [id number]  PCP:  Jinny Sanders, MD  Cardiologist: Sherren Mocha, MD EPS: None  Chief Complaint  Patient presents with  . Hospitalization Follow-up    History of Present Illness:  [name] is a 84 y.o. female with history of multivessel CAD, chronic systolic CHF, PAF, [id number] equals 5 not on anticoagulation, aortic stenosis, DM.  Cardiac cath 02/07/2018 showed 95% ostial left main to distal left main, total proximal RCA, 90% ostial LAD, 80% ostial circumflex to proximal circumflex.  She underwent atherectomy and Impella device insertion 02/13/2018 with successful left main bifurcation stenting and adjuvant atherectomy.  Ejection fraction improved to 55 to 60% on echo 01/25/2018.  Patient comes in today accompanied by her daughter.  She is feeling so much better.  She has no further chest pain and can lay down and sleep at night without gasping for breath.  It [birth year] birthday today.  Gone to celebrated dinner tonight.   Past Medical History:  Diagnosis Date  . Anxiety   . Diaphragmatic hernia without mention of obstruction or gangrene   . Diverticulosis of colon (without mention of hemorrhage) 2009  . Fibromyalgia   . GERD (gastroesophageal reflux disease)   . Hiatal hernia 2009   s/p nissen fundoplication 2956.    Marland Kitchen Hx of adenomatous colonic polyps 2007, 2009   Adenomatous polyps in 2007 and 2009.  Hyperplastic polyps in 2009  . Hyperlipidemia   . IBS (irritable bowel syndrome)   . Internal hemorrhoids without mention of complication 2130   Seen at colonoscopy.  . Thyroid disease   . Type II or unspecified type diabetes mellitus with neurological manifestations, not stated as uncontrolled(250.60)     Past Surgical History:  Procedure Laterality Date  . ABDOMINAL HYSTERECTOMY    . BASAL CELL CARCINOMA EXCISION  04/2016   corner of left eye  . bmd  2007  . CATARACT  EXTRACTION    . CORONARY ATHERECTOMY N/A 02/13/2018   Procedure: CORONARY ATHERECTOMY;  Surgeon: Sherren Mocha, MD;  Location: [facility];  Service: Cardiovascular;  Laterality: N/A;  . ESOPHAGOGASTRODUODENOSCOPY  2007  . HERNIA REPAIR    . INTRAMEDULLARY (IM) NAIL INTERTROCHANTERIC Right 03/01/2017   Procedure: INTRAMEDULLARY (IM) NAIL INTERTROCHANTRIC;  Surgeon: Hiram Gash, MD;  Location: [facility];  Service: Orthopedics;  Laterality: Right;  . LAPAROSCOPIC NISSEN FUNDOPLICATION  8657  . ORIF WRIST FRACTURE Right 03/01/2017   Procedure: OPEN REDUCTION INTERNAL FIXATION (ORIF) WRIST FRACTURE;  Surgeon: Hiram Gash, MD;  Location: [facility];  Service: Orthopedics;  Laterality: Right;  . RIGHT/LEFT HEART CATH AND CORONARY ANGIOGRAPHY N/A 02/07/2018   Procedure: RIGHT/LEFT HEART CATH AND CORONARY ANGIOGRAPHY;  Surgeon: Sherren Mocha, MD;  Location: [facility];  Service: Cardiovascular;  Laterality: N/A;  . THYROIDECTOMY, PARTIAL    . VENTRICULAR ASSIST DEVICE INSERTION N/A 02/13/2018   Procedure: VENTRICULAR ASSIST DEVICE INSERTION;  Surgeon: Sherren Mocha, MD;  Location: [facility];  Service: Cardiovascular;  Laterality: N/A;    Current Medications: Current Meds  Medication Sig  . acetaminophen (TYLENOL) 500 MG tablet Take 1,000 mg by mouth at bedtime as needed for moderate pain.   Marland Kitchen aspirin 81 MG tablet Take 81 mg by mouth daily.    Marland Kitchen atorvastatin (LIPITOR) 40 MG tablet Take 1 tablet (40 mg total) by mouth daily at 6 PM.  . Carboxymethylcellulose Sodium (  THERATEARS OP) Place 1 drop into both eyes daily as needed (dry eyes).  . carvedilol (COREG) 6.25 MG tablet TAKE 1 TABLET BY MOUTH TWICE DAILY WITH MEALS  . clopidogrel (PLAVIX) 75 MG tablet Take 1 tablet (75 mg total) by mouth daily.  . furosemide (LASIX) 40 MG tablet Take 1 tablet (40 mg total) by mouth daily.  . insulin NPH Human (HUMULIN N,NOVOLIN N) 100 UNIT/ML injection Inject 15 Units into the skin at  bedtime.   Marland Kitchen losartan (COZAAR) 25 MG tablet Take 1 tablet (25 mg total) by mouth daily.  . nitroGLYCERIN (NITROSTAT) 0.4 MG SL tablet Place 1 tablet (0.4 mg total) under the tongue every 5 (five) minutes as needed.  Marland Kitchen NOVOLOG FLEXPEN 100 UNIT/ML FlexPen INJECT 10 UNITS SUBCUTANEOUSLY THREE TIMES DAILY WITH MEALS (Patient taking differently: Inject 10 Units into the skin 3 (three) times daily with meals. )  . ONE TOUCH ULTRA TEST test strip USE 1 STRIP TO CHECK GLUCOSE 4 TIMES DAILY  . pantoprazole (PROTONIX) 40 MG tablet Take 1 tablet (40 mg total) by mouth daily.  . potassium chloride (K-DUR) 10 MEQ tablet Take 1 tablet (10 mEq total) by mouth daily.     Allergies:   Metformin; Penicillins; Codeine; Fenofibrate; and Pioglitazone   Social History   Socioeconomic History  . Marital status: Widowed    Spouse name: Not on file  . Number of children: 3  . Years of education: Not on file  . Highest education level: Not on file  Occupational History  . Occupation: retired    Fish farm manager: RETIRED  Social Needs  . Financial resource strain: Not on file  . Food insecurity:    Worry: Not on file    Inability: Not on file  . Transportation needs:    Medical: Not on file    Non-medical: Not on file  Tobacco Use  . Smoking status: Former Smoker    Packs/day: 0.25    Years: 4.00    Pack years: 1.00    Types: Cigarettes    Last attempt to quit: 03/10/1981    Years since quitting: 36.9  . Smokeless tobacco: Never Used  Substance and Sexual Activity  . Alcohol use: No    Alcohol/week: 0.0 standard drinks  . Drug use: No  . Sexual activity: Never  Lifestyle  . Physical activity:    Days per week: Not on file    Minutes per session: Not on file  . Stress: Not on file  Relationships  . Social connections:    Talks on phone: Not on file    Gets together: Not on file    Attends religious service: Not on file    Active member of club or organization: Not on file    Attends meetings of  clubs or organizations: Not on file    Relationship status: Not on file  Other Topics Concern  . Not on file  Social History Narrative   Widowed   3 children           Family History:  The patient's   family history includes Breast cancer in her paternal aunt; Cancer in her mother; Diabetes in her maternal grandfather and son; Heart disease in her paternal grandmother; Hyperlipidemia in her son; Uterine cancer in her mother.   ROS:   Please see the history of present illness.    Review of Systems  Constitution: Negative.  HENT: Positive for hearing loss.   Eyes: Positive for visual disturbance.  Cardiovascular: Negative.  Respiratory: Negative.   Hematologic/Lymphatic: Negative.   Musculoskeletal: Negative.  Negative for joint pain.  Gastrointestinal: Negative.   Genitourinary: Negative.   Neurological: Negative.    All other systems reviewed and are negative.   PHYSICAL EXAM:   VS:  BP 114/60   Pulse 70   Ht 5\' 3"  (1.6 m)   Wt 141 lb 1.9 oz (64 kg)   BMI 25.00 kg/m   Physical Exam  GEN: Well nourished, well developed, in no acute distress  Neck: no JVD, carotid bruits, or masses Cardiac:RRR; 2/6 to 3/6 systolic murmur at the left sternal border into her carotids Respiratory:  clear to auscultation bilaterally, normal work of breathing GI: soft, nontender, nondistended, + BS Ext: Left groin with small knot, no significant hematoma.  Good femoral and distal pulses.  Lower extremities without cyanosis, clubbing, or edema, Good distal pulses bilaterally Neuro:  Alert and Oriented x 3 Psych: euthymic mood, full affect  Wt Readings from Last 3 Encounters:  02/21/18 141 lb 1.9 oz (64 kg)  02/15/18 139 lb 8.8 oz (63.3 kg)  01/18/18 144 lb 6.4 oz (65.5 kg)      Studies/Labs Reviewed:   EKG:  EKG is ordered today.  The ekg ordered today demonstrates normal sinus rhythm, normal EKG  Recent Labs: 03/05/2017: B Natriuretic Peptide 3,581.4; TSH 0.294 03/08/2017:  Magnesium 1.9 01/17/2018: ALT 26; NT-Pro BNP 1,194 02/15/2018: BUN 25; Creatinine, Ser 0.93; Hemoglobin 11.1; Platelets 189; Potassium 4.3; Sodium 136   Lipid Panel    Component Value Date/Time   CHOL 137 02/12/2018 1410   TRIG 187 (H) 02/12/2018 1410   HDL 48 02/12/2018 1410   CHOLHDL 2.9 02/12/2018 1410   VLDL 37 02/12/2018 1410   LDLCALC 52 02/12/2018 1410   LDLDIRECT 101.0 06/23/2016 1157    Additional studies/ records that were reviewed today include:    Coronary arthrectomy 02/13/2018  Ost LM to Dist LM lesion is 95% stenosed.  Ost LAD lesion is 90% stenosed.  Ost Cx to Prox Cx lesion is 80% stenosed.  Ost 2nd Mrg to 2nd Mrg lesion is 50% stenosed.  Prox RCA lesion is 100% stenosed.  A drug-eluting stent was successfully placed using a STENT SIERRA 3.00 X 18 MM.  Post intervention, there is a 0% residual stenosis.  Post intervention, there is a 0% residual stenosis.  A stent was successfully placed.  Post intervention, there is a 0% residual stenosis.   CT Angio (10/31): IMPRESSION: VASCULAR   1. Abdominal aortic atherosclerosis without evidence of aneurysm or stenosis. 2. Accessory lower pole renal artery on the left. 3. Right lower extremity: No significant obstructive disease involving the common or external iliac arteries. Significant disease of the internal iliac artery. Roughly 50-60% focal stenosis of the right common femoral artery. Occluded proximal right SFA. 4. Left lower extremity: No significant obstructive disease involving the common or external iliac arteries. Probable near-total occlusive disease of the proximal left internal iliac artery. No significant stenosis of the left common femoral artery. Diffuse high-grade stenosis of the proximal left SFA.   NON-VASCULAR   1. 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 The right renal pelvis does appear slightly more dilated than on the prior CT in  2018. This may not be of any clinical significance if renal function is normal and the patient is asymptomatic. 2. Tiny umbilical hernia containing fat.   TTE (01/25/18): Study Conclusions   - Left ventricle: The cavity size was normal. Wall thickness was  normal. Systolic function was normal. The estimated ejection   fraction was in the range of 55% to 60%. Wall motion was normal;   there were no regional wall motion abnormalities. Left   ventricular diastolic function parameters were normal. - Aortic valve: Valve mobility was restricted. There was moderate   stenosis. Valve area (VTI): 1.69 cm^2. Valve area (Vmax): 1.01   cm^2. Valve area (Vmean): 1.72 cm^2. - Mitral valve: Calcified annulus. Valve area by pressure   half-time: 2 cm^2. Valve area by continuity equation (using LVOT   flow): 2.82 cm^2. - Left atrium: The atrium was mildly dilated.   Impressions:   - Compared to November 2018, there is substantial improvement in   left ventricular function due to recovery of anteroapical   contractility. Increase aoertic valve gradients are due to   improved LV function, not due to worsening stenosis.   Cardiac cath 10/30:  Ost LM to Dist LM lesion is 95% stenosed.  Ost LAD lesion is 90% stenosed.  Ost Cx to Prox Cx lesion is 80% stenosed.  Prox RCA lesion is 100% stenosed.  There is moderate left ventricular systolic dysfunction.  LV end diastolic pressure is moderately elevated.  The left ventricular ejection fraction is 35-45% by visual estimate.  Ost 2nd Mrg to 2nd Mrg lesion is 50% stenosed.   1.  Critical stenosis of the distal left mainstem extending into the proximal LAD and circumflex 2.  Chronic total occlusion of the RCA collateralized by right to right and left to right collaterals 3.  Moderate segmental LV systolic dysfunction 4.  Moderate aortic stenosis with mean transvalvular gradient 16 mmHg and calculated aortic valve area 0.95 cm 5.  Reduced  cardiac output by Fick measurement   Recommend: Hospital admission, IV heparin, consider revascularization options of multivessel CABG versus hemodynamically supported PCI.  PCI would likely come with prohibitive risk if the patient cannot be hemodynamically supported.  Even with support her risk will be increased with heavy calcification and need to perform atherectomy of the left mainstem in the presence of an occluded RCA.     ASSESSMENT:    1. Coronary artery disease due to calcified coronary lesion   2. Chronic combined systolic and diastolic heart failure ([place])   3. Nonrheumatic aortic valve stenosis   4. PAF (paroxysmal atrial fibrillation) ([facility])   5. Hyperlipidemia, unspecified hyperlipidemia type      PLAN:  In order of problems listed above:  CAD status post complex coronary atherectomy and Impella device insertion 02/13/2018 with successful left main bifurcation stenting and adjuvant atherectomy.  Residual disease as listed above to be managed medically.  On aspirin and Plavix for minimum of 12 months.  Follow-up with Dr. Burt Knack in 2 months.  Chronic combined systolic and diastolic CHF LVEF improved to 55 to 60% on most recent echo-no heart failure on exam.  Aortic valve stenosis at least moderate medical treatment  PAF in the hospital CHA2DS2-VASc equals 5 currently not on anticoagulation.  Discussed with Dr. Burt Knack.  It was only a brief episode and she has not had it before.  At this time she needs aspirin and Plavix.  Will place a 30-day monitor.  Can reevaluate need for anticoagulation in the future if recurrent atrial fibrillation.  Hyperlipidemia LDL 52 on Lipitor    Medication Adjustments/Labs and Tests Ordered: Current medicines are reviewed at length with the patient today.  Concerns regarding medicines are outlined above.  Medication changes, Labs and Tests ordered today are listed in  the Patient Instructions below. Patient Instructions  Medication Instructions:    Your physician recommends that you continue on your current medications as directed. Please refer to the Current Medication list given to you today.  If you need a refill on your cardiac medications before your next appointment, please call your pharmacy.   Lab work: None ordered  If you have labs (blood work) drawn today and your tests are completely normal, you will receive your results only by: Marland Kitchen MyChart Message (if you have MyChart) OR . A paper copy in the mail If you have any lab test that is abnormal or we need to change your treatment, we will call you to review the results.  Testing/Procedures: Your physician has recommended that you wear an event monitor. Event monitors are medical devices that record the heart's electrical activity. Doctors most often [place] these monitors to diagnose arrhythmias. Arrhythmias are problems with the speed or rhythm of the heartbeat. The monitor is a small, portable device. You can wear one while you do your normal daily activities. This is usually used to diagnose what is causing palpitations/syncope (passing out).    Follow-Up: Your physician recommends that you schedule a follow-up appointment in: [place]     Cardiac Event Monitoring A cardiac event monitor is a small recording device that is used to detect abnormal heart rhythms (arrhythmias). The monitor is used to record your heart rhythm when you have symptoms, such as:  Fast heartbeats (palpitations), such as heart racing or fluttering.  Dizziness.  Fainting or light-headedness.  Unexplained weakness.  Some monitors are wired to electrodes placed on your chest. Electrodes are flat, sticky disks that attach to your skin. Other monitors may be hand-held or worn on the wrist. The monitor can be worn for up to 30 days. If the monitor is attached to your chest, a technician will prepare your chest for the electrode placement and show you how to work the monitor. Take time  to practice using the monitor before you leave the office. Make sure you understand how to send the information from the monitor to your health care provider. In some cases, you may need to use a landline telephone instead of a cell phone. What are the risks? Generally, this device is safe to use, but it possible that the skin under the electrodes will become irritated. How to use your cardiac event monitor  Wear your monitor at all times, except when you are in water: ? Do not let the monitor get wet. ? Take the monitor off when you bathe. Do not swim or use a hot tub with it on.  Keep your skin clean. Do not put body lotion or moisturizer on your chest.  Change the electrodes as told by your health care provider or any time they stop sticking to your skin. You may need to use medical tape to keep them on.  Try to put the electrodes in slightly different places on your chest to help prevent skin irritation. They must remain in the area under your left breast and in the upper right section of your chest.  Make sure the monitor is safely clipped to your clothing or in a location close to your body that your health care provider recommends.  Press the button to record as soon as you feel heart-related symptoms, such as: ? Dizziness. ? Weakness. ? Light-headedness. ? Palpitations. ? Thumping or pounding in your chest. ? Shortness of breath. ? Unexplained weakness.  Keep a diary of your activities, such as walking, doing chores, and taking medicine. It is very important to note what you were doing when you pushed the button to record your symptoms. This will help your health care provider determine what might be contributing to your symptoms.  Send the recorded information as recommended by your health care provider. It may take some time for your health care provider to process the results.  Change the batteries as told by your health care provider.  Keep electronic devices away from  your monitor. This includes: ? Tablets. ? MP3 players. ? Cell phones.  While wearing your monitor you should avoid: ? Electric blankets. ? Armed forces operational officer. ? Electric toothbrushes. ? Microwave ovens. ? Magnets. ? Metal detectors. Get help right away if:  You have chest pain.  You have extreme difficulty breathing or shortness of breath.  You develop a very fast heartbeat that persists.  You develop dizziness that does not go away.  You faint or constantly feel like you are about to faint. Summary  A cardiac event monitor is a small recording device that is used to help detect abnormal heart rhythms (arrhythmias).  The monitor is used to record your heart rhythm when you have heart-related symptoms.  Make sure you understand how to send the information from the monitor to your health care provider.  It is important to press the button on the monitor when you have any heart-related symptoms.  Keep a diary of your activities, such as walking, doing chores, and taking medicine. It is very important to note what you were doing when you pushed the button to record your symptoms. This will help your health care provider learn what might be causing your symptoms. This information is not intended to replace advice given to you by your health care provider. Make sure you discuss any questions you have with your health care provider. Document Released: 01/05/2008 Document Revised: 03/12/2016 Document Reviewed: 03/12/2016 Elsevier Interactive Patient Education  2017 [place], Ermalinda Barrios, [place]  02/21/2018 11:54 AM    [facility] [address], [place], [place]  [address] Phone: [phone]; Fax: [phone]

## 2018-02-27 ENCOUNTER — Telehealth: Payer: Self-pay | Admitting: Physician Assistant

## 2018-02-27 NOTE — Telephone Encounter (Signed)
New Message:     Pt saw Estella Husk on 02-21-18. Pt had a small yeast infection on her left inner thigh. Sharyn Lull told her it got bigger to let her know. It is worse now and it is bigger. She needs something called in for this yeast infection please. Please call this to [facility]

## 2018-02-27 NOTE — Telephone Encounter (Signed)
Patient's daughter, [name],  states patient was told at office visit 02/21/18 with Ermalinda Barrios, PA that she had a yeast infection around recent cath insertion site and if it did not improve to call back. Debbie states the area of redness has gotten larger, denies any drainage but is moist and itchy, she is requesting a prescription for something to help.  Debbie advised I will forward to Ermalinda Barrios, PA for review and recommendations.

## 2018-02-28 NOTE — Telephone Encounter (Signed)
I spoke with [name], she is aware of recommendation to use over the counter lotrimin, she thanked me for call.

## 2018-02-28 NOTE — Telephone Encounter (Signed)
Have her use over the counter lotrimin

## 2018-04-11 ENCOUNTER — Other Ambulatory Visit: Payer: Self-pay | Admitting: Family Medicine

## 2018-04-11 ENCOUNTER — Other Ambulatory Visit (HOSPITAL_COMMUNITY): Payer: Self-pay | Admitting: Cardiology

## 2018-04-18 ENCOUNTER — Other Ambulatory Visit: Payer: Self-pay | Admitting: Family Medicine

## 2018-05-02 ENCOUNTER — Ambulatory Visit (INDEPENDENT_AMBULATORY_CARE_PROVIDER_SITE_OTHER): Payer: PPO | Admitting: Cardiovascular Disease

## 2018-05-02 ENCOUNTER — Encounter: Payer: Self-pay | Admitting: Cardiovascular Disease

## 2018-05-02 DIAGNOSIS — E782 Mixed hyperlipidemia: Secondary | ICD-10-CM | POA: Diagnosis not present

## 2018-05-02 DIAGNOSIS — I2510 Atherosclerotic heart disease of native coronary artery without angina pectoris: Secondary | ICD-10-CM | POA: Diagnosis not present

## 2018-05-02 DIAGNOSIS — I5032 Chronic diastolic (congestive) heart failure: Secondary | ICD-10-CM | POA: Diagnosis not present

## 2018-05-02 DIAGNOSIS — I350 Nonrheumatic aortic (valve) stenosis: Secondary | ICD-10-CM

## 2018-05-02 MED ORDER — PANTOPRAZOLE SODIUM 40 MG PO TBEC: 40.0000 mg | DELAYED_RELEASE_TABLET | Freq: Two times a day (BID) | ORAL | 3 refills | Status: AC

## 2018-05-02 NOTE — Patient Instructions (Addendum)
Medication Instructions:  Your provider recommends that you continue on your current medications as directed. Please refer to the Current Medication list given to you today.    Labwork: None  Testing/Procedures: Your provider has requested that you have an echocardiogram. Echocardiography is a painless test that uses sound waves to create images of your heart. It provides your doctor with information about the size and shape of your heart and how well your heart's chambers and valves are working. This procedure takes approximately one hour. There are no restrictions for this procedure. You are scheduled for an echocardiogram on Monday, July 30, 2018. Please arrive by 11:00AM for this appointment.  Follow-Up: You are scheduled for follow-up with Dr. Antionette Char assistant, Richardson Dopp, on Wednesday, August 01, 2018 at 11:15AM. Please arrive by 11:00AM.

## 2018-05-02 NOTE — Progress Notes (Signed)
Cardiology Office Note:    Date:  05/02/2018   ID:  COURTNY [name], DOB 07-Mar-1930, MRN [id number]  PCP:  Jinny Sanders, MD  Cardiologist:  Sherren Mocha, MD  Electrophysiologist:  None   Referring MD: Jinny Sanders, MD   Chief Complaint  Patient presents with  . Coronary Artery Disease    History of Present Illness:    [name] is a 85 y.o. female with a hx of coronary artery disease and systolic heart failure.  The patient has a history of non-STEMI and heart failure that occurred during a hospitalization for a hip fracture in 2019.  At the time she was not well enough to undergo any invasive cardiac evaluation and she was treated medically.  She returned later in the year with ongoing symptoms of chest discomfort, exercise intolerance, weakness, and shortness of breath.  She underwent a repeat echocardiogram which showed improvement in LV function.  She underwent cardiac catheterization that demonstrated critical left main stenosis and chronic occlusion of the RCA.  She was turned down for heart surgery because of advanced age and poor functional capacity.  She underwent atherectomy with Impella hemodynamic support and left main bifurcation stenting in November 2019.  Her procedure was uncomplicated and she recovered quickly.  She is here today with her daughter.  She is doing well.  She still limited from her hip problems and she ambulates with a walker.  She would like to start driving again.  She feels quite well and denies symptoms of shortness of breath, chest discomfort, lightheadedness, or heart palpitations.  She is compliant with her medications.  Past Medical History:  Diagnosis Date  . Anxiety   . Diaphragmatic hernia without mention of obstruction or gangrene   . Diverticulosis of colon (without mention of hemorrhage) 2009  . Fibromyalgia   . GERD (gastroesophageal reflux disease)   . Hiatal hernia 2009   s/p nissen fundoplication 7564.    Marland Kitchen Hx of adenomatous  colonic polyps 2007, 2009   Adenomatous polyps in 2007 and 2009.  Hyperplastic polyps in 2009  . Hyperlipidemia   . IBS (irritable bowel syndrome)   . Internal hemorrhoids without mention of complication 3329   Seen at colonoscopy.  . Thyroid disease   . Type II or unspecified type diabetes mellitus with neurological manifestations, not stated as uncontrolled(250.60)     Past Surgical History:  Procedure Laterality Date  . ABDOMINAL HYSTERECTOMY    . BASAL CELL CARCINOMA EXCISION  04/2016   corner of left eye  . bmd  2007  . CATARACT EXTRACTION    . CORONARY ATHERECTOMY N/A 02/13/2018   Procedure: CORONARY ATHERECTOMY;  Surgeon: Sherren Mocha, MD;  Location: [facility];  Service: Cardiovascular;  Laterality: N/A;  . ESOPHAGOGASTRODUODENOSCOPY  2007  . HERNIA REPAIR    . INTRAMEDULLARY (IM) NAIL INTERTROCHANTERIC Right 03/01/2017   Procedure: INTRAMEDULLARY (IM) NAIL INTERTROCHANTRIC;  Surgeon: Hiram Gash, MD;  Location: [facility];  Service: Orthopedics;  Laterality: Right;  . LAPAROSCOPIC NISSEN FUNDOPLICATION  5188  . ORIF WRIST FRACTURE Right 03/01/2017   Procedure: OPEN REDUCTION INTERNAL FIXATION (ORIF) WRIST FRACTURE;  Surgeon: Hiram Gash, MD;  Location: [facility];  Service: Orthopedics;  Laterality: Right;  . RIGHT/LEFT HEART CATH AND CORONARY ANGIOGRAPHY N/A 02/07/2018   Procedure: RIGHT/LEFT HEART CATH AND CORONARY ANGIOGRAPHY;  Surgeon: Sherren Mocha, MD;  Location: [facility];  Service: Cardiovascular;  Laterality: N/A;  . THYROIDECTOMY, PARTIAL    .  VENTRICULAR ASSIST DEVICE INSERTION N/A 02/13/2018   Procedure: VENTRICULAR ASSIST DEVICE INSERTION;  Surgeon: Sherren Mocha, MD;  Location: [facility];  Service: Cardiovascular;  Laterality: N/A;    Current Medications: Current Meds  Medication Sig  . acetaminophen (TYLENOL) 500 MG tablet Take 1,000 mg by mouth at bedtime as needed for moderate pain.   Marland Kitchen aspirin 81 MG tablet Take 81 mg by mouth  daily.    Marland Kitchen atorvastatin (LIPITOR) 40 MG tablet TAKE 1 TABLET BY MOUTH ONCE DAILY AT  6PM  . Carboxymethylcellulose Sodium (THERATEARS OP) Place 1 drop into both eyes daily as needed (dry eyes).  . carvedilol (COREG) 6.25 MG tablet TAKE 1 TABLET BY MOUTH TWICE DAILY WITH MEALS  . clopidogrel (PLAVIX) 75 MG tablet Take 1 tablet (75 mg total) by mouth daily.  . furosemide (LASIX) 40 MG tablet Take 1 tablet (40 mg total) by mouth daily.  . insulin NPH Human (HUMULIN N,NOVOLIN N) 100 UNIT/ML injection Inject 15 Units into the skin at bedtime.   Marland Kitchen losartan (COZAAR) 25 MG tablet Take 1 tablet (25 mg total) by mouth daily.  . nitroGLYCERIN (NITROSTAT) 0.4 MG SL tablet Place 1 tablet (0.4 mg total) under the tongue every 5 (five) minutes as needed.  Marland Kitchen NOVOLOG FLEXPEN 100 UNIT/ML FlexPen INJECT 10 UNITS SUBCUTANEOUSLY THREE TIMES DAILY WITH MEALS  . ONE TOUCH ULTRA TEST test strip USE 1 STRIP TO CHECK GLUCOSE 4 TIMES DAILY  . potassium chloride (K-DUR) 10 MEQ tablet Take 1 tablet (10 mEq total) by mouth daily.  . [DISCONTINUED] pantoprazole (PROTONIX) 40 MG tablet TAKE 1 TABLET BY MOUTH ONCE DAILY     Allergies:   Metformin; Penicillins; Codeine; Fenofibrate; and Pioglitazone   Social History   Socioeconomic History  . Marital status: Widowed    Spouse name: Not on file  . Number of children: 3  . Years of education: Not on file  . Highest education level: Not on file  Occupational History  . Occupation: retired    Fish farm manager: RETIRED  Social Needs  . Financial resource strain: Not on file  . Food insecurity:    Worry: Not on file    Inability: Not on file  . Transportation needs:    Medical: Not on file    Non-medical: Not on file  Tobacco Use  . Smoking status: Former Smoker    Packs/day: 0.25    Years: 4.00    Pack years: 1.00    Types: Cigarettes    Last attempt to quit: 03/10/1981    Years since quitting: 37.1  . Smokeless tobacco: Never Used  Substance and Sexual Activity  .  Alcohol use: No    Alcohol/week: 0.0 standard drinks  . Drug use: No  . Sexual activity: Never  Lifestyle  . Physical activity:    Days per week: Not on file    Minutes per session: Not on file  . Stress: Not on file  Relationships  . Social connections:    Talks on phone: Not on file    Gets together: Not on file    Attends religious service: Not on file    Active member of club or organization: Not on file    Attends meetings of clubs or organizations: Not on file    Relationship status: Not on file  Other Topics Concern  . Not on file  Social History Narrative   Widowed   3 children           Family  History: The patient's family history includes Breast cancer in her paternal aunt; Cancer in her mother; Diabetes in her maternal grandfather and son; Heart disease in her paternal grandmother; Hyperlipidemia in her son; Uterine cancer in her mother. There is no history of Colon cancer or Stomach cancer.  ROS:   Please see the history of present illness.    All other systems reviewed and are negative.  EKGs/Labs/Other Studies Reviewed:    EKG:  EKG is not ordered today.   Recent Labs: 01/17/2018: ALT 26; NT-Pro BNP 1,194 02/15/2018: BUN 25; Creatinine, Ser 0.93; Hemoglobin 11.1; Platelets 189; Potassium 4.3; Sodium 136  Recent Lipid Panel    Component Value Date/Time   CHOL 137 02/12/2018 1410   TRIG 187 (H) 02/12/2018 1410   HDL 48 02/12/2018 1410   CHOLHDL 2.9 02/12/2018 1410   VLDL 37 02/12/2018 1410   LDLCALC 52 02/12/2018 1410   LDLDIRECT 101.0 06/23/2016 1157    Physical Exam:    VS:  BP 110/72   Pulse 71   Ht 5\' 3"  (1.6 m)   Wt 143 lb 1.9 oz (64.9 kg)   SpO2 96%   BMI 25.35 kg/m     Wt Readings from Last 3 Encounters:  05/02/18 143 lb 1.9 oz (64.9 kg)  02/21/18 141 lb 1.9 oz (64 kg)  02/15/18 139 lb 8.8 oz (63.3 kg)     GEN: Well nourished, well developed elderly woman in no acute distress HEENT: Normal NECK: No JVD; No carotid  bruits LYMPHATICS: No lymphadenopathy CARDIAC: RRR, 2/6 systolic murmur at the right upper sternal border RESPIRATORY:  Clear to auscultation without rales, wheezing or rhonchi  ABDOMEN: Soft, non-tender, non-distended MUSCULOSKELETAL:  No edema; No deformity  SKIN: Warm and dry NEUROLOGIC:  Alert and oriented x 3 PSYCHIATRIC:  Normal affect   ASSESSMENT:    1. Chronic diastolic heart failure ([place])   2. Coronary artery disease involving native coronary artery of native heart without angina pectoris   3. Mixed hyperlipidemia   4. Nonrheumatic aortic valve stenosis    PLAN:    In order of problems listed above:  1. The patient appears euvolemic.  She will continue on carvedilol and losartan.  She also takes daily furosemide.  We will repeat an echocardiogram in April prior to her follow-up evaluation.  At the time of her last echocardiogram, her LV systolic function had normalized. 2. The patient is stable without symptoms of angina.  I would be inclined to keep her on long-term dual antiplatelet therapy with aspirin and clopidogrel since she underwent complex distal left main stenting.  She has had marked clinical improvement since her procedure. 3. The patient is treated with a high intensity statin drug.  Her last LDL cholesterol was 52 mg/dL. 4. The patient has previous echo studies and a clinical exam consistent with moderate aortic stenosis.  We will repeat an echocardiogram in 3 months when she returns for follow-up.   Medication Adjustments/Labs and Tests Ordered: Current medicines are reviewed at length with the patient today.  Concerns regarding medicines are outlined above.  Orders Placed This Encounter  Procedures  . ECHOCARDIOGRAM COMPLETE   Meds ordered this encounter  Medications  . pantoprazole (PROTONIX) 40 MG tablet    Sig: Take 1 tablet (40 mg total) by mouth 2 (two) times daily.    Dispense:  180 tablet    Refill:  3    Patient Instructions  Medication  Instructions:  Your provider recommends that you continue on your  current medications as directed. Please refer to the Current Medication list given to you today.    Labwork: None  Testing/Procedures: Your provider has requested that you have an echocardiogram. Echocardiography is a painless test that uses sound waves to create images of your heart. It provides your doctor with information about the size and shape of your heart and how well your heart's chambers and valves are working. This procedure takes approximately one hour. There are no restrictions for this procedure. You are scheduled for an echocardiogram on Monday, July 30, 2018. Please arrive by 11:00AM for this appointment.  Follow-Up: You are scheduled for follow-up with Dr. Antionette Char assistant, Richardson Dopp, on Wednesday, August 01, 2018 at 11:15AM. Please arrive by 11:00AM.    Signed, Sherren Mocha, MD  05/02/2018 5:16 PM    [place]

## 2018-05-19 ENCOUNTER — Inpatient Hospital Stay (HOSPITAL_COMMUNITY): Payer: PPO

## 2018-05-19 ENCOUNTER — Inpatient Hospital Stay: Payer: Self-pay

## 2018-05-19 ENCOUNTER — Encounter (HOSPITAL_COMMUNITY)
Admission: EM | Disposition: A | Discharge status: Home / Self Care | Payer: Self-pay | Source: Home / Self Care | Attending: Cardiovascular Disease

## 2018-05-19 ENCOUNTER — Emergency Department (HOSPITAL_COMMUNITY): Payer: PPO

## 2018-05-19 ENCOUNTER — Encounter (HOSPITAL_COMMUNITY): Payer: Self-pay | Admitting: Emergency Medicine

## 2018-05-19 ENCOUNTER — Inpatient Hospital Stay (HOSPITAL_COMMUNITY)
Admission: EM | Admit: 2018-05-19 | Discharge: 2018-05-25 | DRG: 215 | Disposition: A | Discharge status: Home / Self Care | Payer: PPO | Attending: Cardiovascular Disease | Admitting: Cardiovascular Disease

## 2018-05-19 ENCOUNTER — Other Ambulatory Visit: Payer: Self-pay

## 2018-05-19 DIAGNOSIS — Z006 Encounter for examination for normal comparison and control in clinical research program: Secondary | ICD-10-CM

## 2018-05-19 DIAGNOSIS — Z87891 Personal history of nicotine dependence: Secondary | ICD-10-CM

## 2018-05-19 DIAGNOSIS — I350 Nonrheumatic aortic (valve) stenosis: Secondary | ICD-10-CM | POA: Diagnosis present

## 2018-05-19 DIAGNOSIS — D6959 Other secondary thrombocytopenia: Secondary | ICD-10-CM | POA: Diagnosis not present

## 2018-05-19 DIAGNOSIS — R509 Fever, unspecified: Secondary | ICD-10-CM | POA: Diagnosis not present

## 2018-05-19 DIAGNOSIS — Z95811 Presence of heart assist device: Secondary | ICD-10-CM | POA: Diagnosis not present

## 2018-05-19 DIAGNOSIS — I480 Paroxysmal atrial fibrillation: Secondary | ICD-10-CM | POA: Diagnosis present

## 2018-05-19 DIAGNOSIS — Z9849 Cataract extraction status, unspecified eye: Secondary | ICD-10-CM

## 2018-05-19 DIAGNOSIS — J189 Pneumonia, unspecified organism: Secondary | ICD-10-CM

## 2018-05-19 DIAGNOSIS — Z794 Long term (current) use of insulin: Secondary | ICD-10-CM

## 2018-05-19 DIAGNOSIS — I340 Nonrheumatic mitral (valve) insufficiency: Secondary | ICD-10-CM

## 2018-05-19 DIAGNOSIS — Z880 Allergy status to penicillin: Secondary | ICD-10-CM

## 2018-05-19 DIAGNOSIS — D599 Acquired hemolytic anemia, unspecified: Secondary | ICD-10-CM | POA: Diagnosis not present

## 2018-05-19 DIAGNOSIS — E785 Hyperlipidemia, unspecified: Secondary | ICD-10-CM | POA: Diagnosis present

## 2018-05-19 DIAGNOSIS — I9589 Other hypotension: Secondary | ICD-10-CM | POA: Diagnosis not present

## 2018-05-19 DIAGNOSIS — R079 Chest pain, unspecified: Secondary | ICD-10-CM | POA: Diagnosis not present

## 2018-05-19 DIAGNOSIS — I2584 Coronary atherosclerosis due to calcified coronary lesion: Secondary | ICD-10-CM | POA: Diagnosis not present

## 2018-05-19 DIAGNOSIS — E1142 Type 2 diabetes mellitus with diabetic polyneuropathy: Secondary | ICD-10-CM | POA: Diagnosis not present

## 2018-05-19 DIAGNOSIS — N179 Acute kidney failure, unspecified: Secondary | ICD-10-CM | POA: Diagnosis not present

## 2018-05-19 DIAGNOSIS — Z888 Allergy status to other drugs, medicaments and biological substances status: Secondary | ICD-10-CM

## 2018-05-19 DIAGNOSIS — K589 Irritable bowel syndrome without diarrhea: Secondary | ICD-10-CM | POA: Diagnosis present

## 2018-05-19 DIAGNOSIS — M797 Fibromyalgia: Secondary | ICD-10-CM | POA: Diagnosis present

## 2018-05-19 DIAGNOSIS — I509 Heart failure, unspecified: Secondary | ICD-10-CM

## 2018-05-19 DIAGNOSIS — R570 Cardiogenic shock: Secondary | ICD-10-CM | POA: Diagnosis not present

## 2018-05-19 DIAGNOSIS — Z885 Allergy status to narcotic agent status: Secondary | ICD-10-CM

## 2018-05-19 DIAGNOSIS — K219 Gastro-esophageal reflux disease without esophagitis: Secondary | ICD-10-CM | POA: Diagnosis present

## 2018-05-19 DIAGNOSIS — Z8249 Family history of ischemic heart disease and other diseases of the circulatory system: Secondary | ICD-10-CM

## 2018-05-19 DIAGNOSIS — I200 Unstable angina: Secondary | ICD-10-CM

## 2018-05-19 DIAGNOSIS — T82855A Stenosis of coronary artery stent, initial encounter: Secondary | ICD-10-CM | POA: Diagnosis not present

## 2018-05-19 DIAGNOSIS — M810 Age-related osteoporosis without current pathological fracture: Secondary | ICD-10-CM | POA: Diagnosis present

## 2018-05-19 DIAGNOSIS — I2510 Atherosclerotic heart disease of native coronary artery without angina pectoris: Secondary | ICD-10-CM | POA: Diagnosis not present

## 2018-05-19 DIAGNOSIS — I213 ST elevation (STEMI) myocardial infarction of unspecified site: Secondary | ICD-10-CM | POA: Diagnosis not present

## 2018-05-19 DIAGNOSIS — Z833 Family history of diabetes mellitus: Secondary | ICD-10-CM

## 2018-05-19 DIAGNOSIS — I255 Ischemic cardiomyopathy: Secondary | ICD-10-CM | POA: Diagnosis not present

## 2018-05-19 DIAGNOSIS — Z8049 Family history of malignant neoplasm of other genital organs: Secondary | ICD-10-CM

## 2018-05-19 DIAGNOSIS — E890 Postprocedural hypothyroidism: Secondary | ICD-10-CM | POA: Diagnosis present

## 2018-05-19 DIAGNOSIS — Z85828 Personal history of other malignant neoplasm of skin: Secondary | ICD-10-CM

## 2018-05-19 DIAGNOSIS — I2101 ST elevation (STEMI) myocardial infarction involving left main coronary artery: Secondary | ICD-10-CM | POA: Diagnosis not present

## 2018-05-19 DIAGNOSIS — I471 Supraventricular tachycardia, unspecified: Secondary | ICD-10-CM

## 2018-05-19 DIAGNOSIS — R0602 Shortness of breath: Secondary | ICD-10-CM | POA: Diagnosis not present

## 2018-05-19 DIAGNOSIS — I110 Hypertensive heart disease with heart failure: Secondary | ICD-10-CM | POA: Diagnosis not present

## 2018-05-19 DIAGNOSIS — I351 Nonrheumatic aortic (valve) insufficiency: Secondary | ICD-10-CM | POA: Diagnosis not present

## 2018-05-19 DIAGNOSIS — Z79899 Other long term (current) drug therapy: Secondary | ICD-10-CM

## 2018-05-19 DIAGNOSIS — I5042 Chronic combined systolic (congestive) and diastolic (congestive) heart failure: Secondary | ICD-10-CM | POA: Diagnosis present

## 2018-05-19 DIAGNOSIS — R000 Tachycardia, unspecified: Secondary | ICD-10-CM | POA: Diagnosis not present

## 2018-05-19 DIAGNOSIS — I252 Old myocardial infarction: Secondary | ICD-10-CM

## 2018-05-19 DIAGNOSIS — I472 Ventricular tachycardia: Secondary | ICD-10-CM | POA: Diagnosis not present

## 2018-05-19 DIAGNOSIS — Z803 Family history of malignant neoplasm of breast: Secondary | ICD-10-CM

## 2018-05-19 DIAGNOSIS — Z7901 Long term (current) use of anticoagulants: Secondary | ICD-10-CM

## 2018-05-19 DIAGNOSIS — Z7902 Long term (current) use of antithrombotics/antiplatelets: Secondary | ICD-10-CM

## 2018-05-19 DIAGNOSIS — Z9861 Coronary angioplasty status: Secondary | ICD-10-CM

## 2018-05-19 DIAGNOSIS — Z90710 Acquired absence of both cervix and uterus: Secondary | ICD-10-CM

## 2018-05-19 HISTORY — PX: VENTRICULAR ASSIST DEVICE INSERTION: CATH118273

## 2018-05-19 HISTORY — PX: RIGHT/LEFT HEART CATH AND CORONARY ANGIOGRAPHY: CATH118266

## 2018-05-19 HISTORY — PX: CARDIOVERSION: EP1203

## 2018-05-19 HISTORY — PX: CORONARY BALLOON ANGIOPLASTY: CATH118233

## 2018-05-19 LAB — COMPREHENSIVE METABOLIC PANEL
ALT: 17 U/L (ref 0–44)
ALT: 21 U/L (ref 0–44)
AST: 181 U/L — AB (ref 15–41)
AST: 197 U/L — ABNORMAL HIGH (ref 15–41)
Albumin: 3.1 g/dL — ABNORMAL LOW (ref 3.5–5.0)
Albumin: 3.1 g/dL — ABNORMAL LOW (ref 3.5–5.0)
Alkaline Phosphatase: 112 U/L (ref 38–126)
Alkaline Phosphatase: 109 U/L (ref 38–126)
Anion gap: 17 — ABNORMAL HIGH (ref 5–15)
Anion gap: 16 — ABNORMAL HIGH (ref 5–15)
BUN: 37 mg/dL — ABNORMAL HIGH (ref 8–23)
BUN: 40 mg/dL — ABNORMAL HIGH (ref 8–23)
CO2: 19 mmol/L — ABNORMAL LOW (ref 22–32)
CO2: 19 mmol/L — ABNORMAL LOW (ref 22–32)
CREATININE: 1.67 mg/dL — AB (ref 0.44–1.00)
Calcium: 8.8 mg/dL — ABNORMAL LOW (ref 8.9–10.3)
Calcium: 8.6 mg/dL — ABNORMAL LOW (ref 8.9–10.3)
Chloride: 102 mmol/L (ref 98–111)
Chloride: 105 mmol/L (ref 98–111)
Creatinine, Ser: 1.69 mg/dL — ABNORMAL HIGH (ref 0.44–1.00)
GFR calc Af Amer: 31 mL/min — ABNORMAL LOW (ref >60)
GFR calc non Af Amer: 27 mL/min — ABNORMAL LOW (ref >60)
GFR, EST AFRICAN AMERICAN: 32 mL/min — AB (ref >60)
GFR, EST NON AFRICAN AMERICAN: 27 mL/min — AB (ref >60)
Glucose, Bld: 227 mg/dL — ABNORMAL HIGH (ref 70–99)
Glucose, Bld: 233 mg/dL — ABNORMAL HIGH (ref 70–99)
Potassium: 4.4 mmol/L (ref 3.5–5.1)
Potassium: 4.1 mmol/L (ref 3.5–5.1)
Sodium: 138 mmol/L (ref 135–145)
Sodium: 140 mmol/L (ref 135–145)
Total Bilirubin: 2.3 mg/dL — ABNORMAL HIGH (ref 0.3–1.2)
Total Bilirubin: 2.2 mg/dL — ABNORMAL HIGH (ref 0.3–1.2)
Total Protein: 6.6 g/dL (ref 6.5–8.1)
Total Protein: 6.5 g/dL (ref 6.5–8.1)

## 2018-05-19 LAB — CBC WITH DIFFERENTIAL/PLATELET
Abs Immature Granulocytes: 0.15 10*3/uL — ABNORMAL HIGH (ref 0.00–0.07)
BASOS PCT: 0 %
Basophils Absolute: 0 10*3/uL (ref 0.0–0.1)
Eosinophils Absolute: 0 10*3/uL (ref 0.0–0.5)
Eosinophils Relative: 0 %
HCT: 39.6 % (ref 36.0–46.0)
Hemoglobin: 12.3 g/dL (ref 12.0–15.0)
Immature Granulocytes: 1 %
Lymphocytes Relative: 10 %
Lymphs Abs: 2 10*3/uL (ref 0.7–4.0)
MCH: 26.3 pg (ref 26.0–34.0)
MCHC: 31.1 g/dL (ref 30.0–36.0)
MCV: 84.6 fL (ref 80.0–100.0)
MONOS PCT: 11 %
Monocytes Absolute: 2.2 10*3/uL — ABNORMAL HIGH (ref 0.1–1.0)
Neutro Abs: 15.6 10*3/uL — ABNORMAL HIGH (ref 1.7–7.7)
Neutrophils Relative %: 78 %
Platelets: 268 10*3/uL (ref 150–400)
RBC: 4.68 MIL/uL (ref 3.87–5.11)
RDW: 15 % (ref 11.5–15.5)
WBC: 20 10*3/uL — ABNORMAL HIGH (ref 4.0–10.5)
nRBC: 0 % (ref 0.0–0.2)

## 2018-05-19 LAB — POCT I-STAT 7, (LYTES, BLD GAS, ICA,H+H)
Acid-base deficit: 6 mmol/L — ABNORMAL HIGH (ref 0.0–2.0)
Bicarbonate: 17 mmol/L — ABNORMAL LOW (ref 20.0–28.0)
Calcium, Ion: 1 mmol/L — ABNORMAL LOW (ref 1.15–1.40)
HCT: 34 % — ABNORMAL LOW (ref 36.0–46.0)
Hemoglobin: 11.6 g/dL — ABNORMAL LOW (ref 12.0–15.0)
O2 Saturation: 95 %
Patient temperature: 97.7
Potassium: 5.5 mmol/L — ABNORMAL HIGH (ref 3.5–5.1)
SODIUM: 138 mmol/L (ref 135–145)
TCO2: 18 mmol/L — ABNORMAL LOW (ref 22–32)
pCO2 arterial: 25.1 mmHg — ABNORMAL LOW (ref 32.0–48.0)
pH, Arterial: 7.437 (ref 7.350–7.450)
pO2, Arterial: 69 mmHg — ABNORMAL LOW (ref 83.0–108.0)

## 2018-05-19 LAB — BASIC METABOLIC PANEL
Anion gap: 13 (ref 5–15)
BUN: 26 mg/dL — ABNORMAL HIGH (ref 8–23)
CO2: 20 mmol/L — ABNORMAL LOW (ref 22–32)
Calcium: 9.5 mg/dL (ref 8.9–10.3)
Chloride: 105 mmol/L (ref 98–111)
Creatinine, Ser: 1.14 mg/dL — ABNORMAL HIGH (ref 0.44–1.00)
GFR calc Af Amer: 50 mL/min — ABNORMAL LOW (ref >60)
GFR calc non Af Amer: 44 mL/min — ABNORMAL LOW (ref >60)
Glucose, Bld: 183 mg/dL — ABNORMAL HIGH (ref 70–99)
Potassium: 4.2 mmol/L (ref 3.5–5.1)
Sodium: 138 mmol/L (ref 135–145)

## 2018-05-19 LAB — CBC
HCT: 39.3 % (ref 36.0–46.0)
Hemoglobin: 12.2 g/dL (ref 12.0–15.0)
MCH: 26.1 pg (ref 26.0–34.0)
MCHC: 31 g/dL (ref 30.0–36.0)
MCV: 84 fL (ref 80.0–100.0)
Platelets: 271 10*3/uL (ref 150–400)
RBC: 4.68 MIL/uL (ref 3.87–5.11)
RDW: 14.6 % (ref 11.5–15.5)
WBC: 13.8 10*3/uL — ABNORMAL HIGH (ref 4.0–10.5)
nRBC: 0 % (ref 0.0–0.2)

## 2018-05-19 LAB — GLUCOSE, CAPILLARY
Glucose-Capillary: 239 mg/dL — ABNORMAL HIGH (ref 70–99)
Glucose-Capillary: 204 mg/dL — ABNORMAL HIGH (ref 70–99)
Glucose-Capillary: 208 mg/dL — ABNORMAL HIGH (ref 70–99)

## 2018-05-19 LAB — URINALYSIS, ROUTINE W REFLEX MICROSCOPIC
Bilirubin Urine: NEGATIVE
Glucose, UA: NEGATIVE mg/dL
Ketones, ur: NEGATIVE mg/dL
Leukocytes, UA: NEGATIVE
Nitrite: NEGATIVE
Protein, ur: 100 mg/dL — AB
Specific Gravity, Urine: 1.02 (ref 1.005–1.030)
pH: 6 (ref 5.0–8.0)

## 2018-05-19 LAB — I-STAT TROPONIN, ED: Troponin i, poc: 1.44 ng/mL (ref 0.00–0.08)

## 2018-05-19 LAB — COOXEMETRY PANEL
Carboxyhemoglobin: 1.4 % (ref 0.5–1.5)
Carboxyhemoglobin: 1.3 % (ref 0.5–1.5)
Carboxyhemoglobin: 1.5 % (ref 0.5–1.5)
Methemoglobin: 1.7 % — ABNORMAL HIGH (ref 0.0–1.5)
Methemoglobin: 1.8 % — ABNORMAL HIGH (ref 0.0–1.5)
Methemoglobin: 1.9 % — ABNORMAL HIGH (ref 0.0–1.5)
O2 Saturation: 46 %
O2 Saturation: 42 %
O2 Saturation: 43.6 %
Total hemoglobin: 12.8 g/dL (ref 12.0–16.0)
Total hemoglobin: 12.6 g/dL (ref 12.0–16.0)
Total hemoglobin: 12 g/dL (ref 12.0–16.0)

## 2018-05-19 LAB — ECHOCARDIOGRAM COMPLETE
Height: 63 in
Weight: 2289.26 oz

## 2018-05-19 LAB — BRAIN NATRIURETIC PEPTIDE: B Natriuretic Peptide: 870.3 pg/mL — ABNORMAL HIGH (ref 0.0–100.0)

## 2018-05-19 LAB — LACTATE DEHYDROGENASE
LDH: 1043 U/L — AB (ref 98–192)
LDH: 1074 U/L — ABNORMAL HIGH (ref 98–192)

## 2018-05-19 LAB — TROPONIN I
TROPONIN I: 59.66 ng/mL — AB (ref <0.03)
Troponin I: 52.81 ng/mL (ref <0.03)

## 2018-05-19 LAB — POCT ACTIVATED CLOTTING TIME
Activated Clotting Time: 208 seconds
Activated Clotting Time: 158 seconds
Activated Clotting Time: 147 seconds

## 2018-05-19 LAB — MRSA PCR SCREENING: MRSA by PCR: NEGATIVE

## 2018-05-19 SURGERY — VENTRICULAR ASSIST DEVICE INSERTION
Anesthesia: LOCAL

## 2018-05-19 MED ORDER — NOREPINEPHRINE BITARTRATE 1 MG/ML IV SOLN
INTRAVENOUS | Status: AC | PRN
  Administered 2018-05-19: 10 ug/min via INTRAVENOUS

## 2018-05-19 MED ORDER — SODIUM CHLORIDE 0.9% FLUSH: 3.0000 mL | INTRAVENOUS | Status: DC | PRN

## 2018-05-19 MED ORDER — HEPARIN (PORCINE) 25000 UT/250ML-% IV SOLN
600.0000 [IU]/h | INTRAVENOUS | Status: DC
  Administered 2018-05-19: 200 [IU]/h via INTRAVENOUS
  Administered 2018-05-21 – 2018-05-22 (×2): 1100 [IU]/h via INTRAVENOUS
  Filled 2018-05-19 (×3): qty 250

## 2018-05-19 MED ORDER — SODIUM CHLORIDE 0.9% FLUSH: 10.0000 mL | INTRAVENOUS | Status: DC | PRN

## 2018-05-19 MED ORDER — ACETAMINOPHEN 325 MG PO TABS
650.0000 mg | ORAL_TABLET | ORAL | Status: DC | PRN
  Administered 2018-05-19 – 2018-05-22 (×4): 650 mg via ORAL
  Filled 2018-05-19 (×5): qty 2

## 2018-05-19 MED ORDER — ONDANSETRON HCL 4 MG/2ML IJ SOLN
4.0000 mg | Freq: Four times a day (QID) | INTRAMUSCULAR | Status: DC | PRN
  Administered 2018-05-19 – 2018-05-21 (×4): 4 mg via INTRAVENOUS
  Filled 2018-05-19 (×4): qty 2

## 2018-05-19 MED ORDER — SODIUM CHLORIDE 0.9% FLUSH
10.0000 mL | Freq: Two times a day (BID) | INTRAVENOUS | Status: DC
  Administered 2018-05-19: 30 mL
  Administered 2018-05-20: 10 mL
  Administered 2018-05-20 – 2018-05-21 (×2): 20 mL
  Administered 2018-05-22 – 2018-05-25 (×6): 10 mL

## 2018-05-19 MED ORDER — FUROSEMIDE 10 MG/ML IJ SOLN
INTRAMUSCULAR | Status: DC | PRN
  Administered 2018-05-19: 60 mg via INTRAVENOUS
  Administered 2018-05-19: 40 mg via INTRAVENOUS

## 2018-05-19 MED ORDER — HYDRALAZINE HCL 20 MG/ML IJ SOLN: 5.0000 mg | INTRAMUSCULAR | Status: AC | PRN

## 2018-05-19 MED ORDER — NITROGLYCERIN 1 MG/10 ML FOR IR/CATH LAB
INTRA_ARTERIAL | Status: AC
  Filled 2018-05-19: qty 10

## 2018-05-19 MED ORDER — BIVALIRUDIN TRIFLUOROACETATE 250 MG IV SOLR
INTRAVENOUS | Status: AC
  Filled 2018-05-19: qty 250

## 2018-05-19 MED ORDER — TICAGRELOR 90 MG PO TABS
90.0000 mg | ORAL_TABLET | Freq: Two times a day (BID) | ORAL | Status: DC
  Administered 2018-05-19 – 2018-05-25 (×12): 90 mg via ORAL
  Filled 2018-05-19 (×12): qty 1

## 2018-05-19 MED ORDER — METOPROLOL TARTRATE 5 MG/5ML IV SOLN
INTRAVENOUS | Status: DC | PRN
  Administered 2018-05-19 (×2): 1 mg via INTRAVENOUS

## 2018-05-19 MED ORDER — HEPARIN BOLUS VIA INFUSION
4000.0000 [IU] | Freq: Once | INTRAVENOUS | Status: AC
  Administered 2018-05-19: 4000 [IU] via INTRAVENOUS
  Filled 2018-05-19: qty 4000

## 2018-05-19 MED ORDER — METOPROLOL TARTRATE 5 MG/5ML IV SOLN
INTRAVENOUS | Status: AC
  Filled 2018-05-19: qty 5

## 2018-05-19 MED ORDER — SODIUM CHLORIDE 0.9% FLUSH
3.0000 mL | Freq: Once | INTRAVENOUS | Status: AC
  Administered 2018-05-19: 3 mL via INTRAVENOUS

## 2018-05-19 MED ORDER — FUROSEMIDE 10 MG/ML IJ SOLN
80.0000 mg | Freq: Two times a day (BID) | INTRAMUSCULAR | Status: DC
  Administered 2018-05-19: 80 mg via INTRAVENOUS
  Filled 2018-05-19: qty 8

## 2018-05-19 MED ORDER — FUROSEMIDE 10 MG/ML IJ SOLN
INTRAMUSCULAR | Status: AC
  Filled 2018-05-19: qty 8

## 2018-05-19 MED ORDER — NOREPINEPHRINE 4 MG/250ML-% IV SOLN
0.0000 ug/min | INTRAVENOUS | Status: DC
  Administered 2018-05-20 (×2): 5 ug/min via INTRAVENOUS
  Administered 2018-05-21: 9 ug/min via INTRAVENOUS
  Administered 2018-05-21: 10 ug/min via INTRAVENOUS
  Administered 2018-05-22: 11 ug/min via INTRAVENOUS
  Administered 2018-05-22: 8 ug/min via INTRAVENOUS
  Administered 2018-05-22: 9 ug/min via INTRAVENOUS
  Administered 2018-05-23: 8 ug/min via INTRAVENOUS
  Filled 2018-05-19 (×8): qty 250

## 2018-05-19 MED ORDER — ATORVASTATIN CALCIUM 80 MG PO TABS
80.0000 mg | ORAL_TABLET | Freq: Every day | ORAL | Status: DC
  Administered 2018-05-19 – 2018-05-24 (×6): 80 mg via ORAL
  Filled 2018-05-19 (×6): qty 1

## 2018-05-19 MED ORDER — NOREPINEPHRINE 4 MG/250ML-% IV SOLN
INTRAVENOUS | Status: AC
  Filled 2018-05-19: qty 250

## 2018-05-19 MED ORDER — TICAGRELOR 90 MG PO TABS
ORAL_TABLET | ORAL | Status: DC | PRN
  Administered 2018-05-19: 180 mg via ORAL

## 2018-05-19 MED ORDER — LIDOCAINE HCL (PF) 1 % IJ SOLN
INTRAMUSCULAR | Status: DC | PRN
  Administered 2018-05-19: 10 mL via SUBCUTANEOUS
  Administered 2018-05-19: 2 mL via SUBCUTANEOUS
  Administered 2018-05-19: 18 mL via SUBCUTANEOUS

## 2018-05-19 MED ORDER — BIVALIRUDIN BOLUS VIA INFUSION - CUPID
INTRAVENOUS | Status: DC | PRN
  Administered 2018-05-19: 48.675 mg via INTRAVENOUS

## 2018-05-19 MED ORDER — NITROGLYCERIN IN D5W 200-5 MCG/ML-% IV SOLN
0.0000 ug/min | INTRAVENOUS | Status: DC
  Administered 2018-05-19: 5 ug/min via INTRAVENOUS
  Filled 2018-05-19: qty 250

## 2018-05-19 MED ORDER — ASPIRIN 81 MG PO CHEW
81.0000 mg | CHEWABLE_TABLET | Freq: Every day | ORAL | Status: DC
  Administered 2018-05-20 – 2018-05-25 (×6): 81 mg via ORAL
  Filled 2018-05-19 (×6): qty 1

## 2018-05-19 MED ORDER — HEPARIN (PORCINE) 25000 UT/250ML-% IV SOLN
900.0000 [IU]/h | INTRAVENOUS | Status: DC
  Administered 2018-05-19: 900 [IU]/h via INTRAVENOUS
  Filled 2018-05-19: qty 250

## 2018-05-19 MED ORDER — CHLORHEXIDINE GLUCONATE CLOTH 2 % EX PADS
6.0000 | MEDICATED_PAD | Freq: Every day | CUTANEOUS | Status: DC
  Administered 2018-05-19 – 2018-05-25 (×7): 6 via TOPICAL

## 2018-05-19 MED ORDER — FUROSEMIDE 10 MG/ML IJ SOLN
INTRAMUSCULAR | Status: AC
  Filled 2018-05-19: qty 4

## 2018-05-19 MED ORDER — TICAGRELOR 90 MG PO TABS
ORAL_TABLET | ORAL | Status: AC
  Filled 2018-05-19: qty 1

## 2018-05-19 MED ORDER — ONDANSETRON HCL 4 MG/2ML IJ SOLN
4.0000 mg | Freq: Once | INTRAMUSCULAR | Status: AC
  Administered 2018-05-19: 4 mg via INTRAVENOUS
  Filled 2018-05-19: qty 2

## 2018-05-19 MED ORDER — SODIUM CHLORIDE 0.9 % IV SOLN
250.0000 mL | INTRAVENOUS | Status: DC | PRN
  Administered 2018-05-20: 250 mL via INTRAVENOUS

## 2018-05-19 MED ORDER — INSULIN ASPART 100 UNIT/ML ~~LOC~~ SOLN
0.0000 [IU] | Freq: Three times a day (TID) | SUBCUTANEOUS | Status: DC
  Administered 2018-05-19: 5 [IU] via SUBCUTANEOUS
  Administered 2018-05-20: 8 [IU] via SUBCUTANEOUS
  Administered 2018-05-20: 5 [IU] via SUBCUTANEOUS
  Administered 2018-05-20: 8 [IU] via SUBCUTANEOUS

## 2018-05-19 MED ORDER — LABETALOL HCL 5 MG/ML IV SOLN: 10.0000 mg | INTRAVENOUS | Status: AC | PRN

## 2018-05-19 MED ORDER — LIDOCAINE HCL (PF) 1 % IJ SOLN
INTRAMUSCULAR | Status: AC
  Filled 2018-05-19: qty 30

## 2018-05-19 MED ORDER — SODIUM CHLORIDE 0.9% FLUSH
3.0000 mL | Freq: Two times a day (BID) | INTRAVENOUS | Status: DC
  Administered 2018-05-19 – 2018-05-24 (×8): 3 mL via INTRAVENOUS

## 2018-05-19 MED ORDER — ASPIRIN 81 MG PO CHEW
324.0000 mg | CHEWABLE_TABLET | Freq: Once | ORAL | Status: AC
  Administered 2018-05-19: 324 mg via ORAL
  Filled 2018-05-19: qty 4

## 2018-05-19 MED ORDER — FENTANYL CITRATE (PF) 100 MCG/2ML IJ SOLN
25.0000 ug | INTRAMUSCULAR | Status: AC | PRN
  Administered 2018-05-19 – 2018-05-22 (×2): 25 ug via INTRAVENOUS
  Filled 2018-05-19 (×2): qty 2

## 2018-05-19 MED ORDER — HEPARIN SODIUM (PORCINE) 5000 UNIT/ML IJ SOLN
25000.0000 [IU] | INTRAVENOUS | Status: DC
  Filled 2018-05-19 (×3): qty 5

## 2018-05-19 MED ORDER — SODIUM CHLORIDE 0.9 % IV SOLN
INTRAVENOUS | Status: AC | PRN
  Administered 2018-05-19 (×2): 1.75 mg/kg/h via INTRAVENOUS

## 2018-05-19 MED ORDER — HEPARIN (PORCINE) IN NACL 1000-0.9 UT/500ML-% IV SOLN
INTRAVENOUS | Status: AC
  Filled 2018-05-19: qty 1000

## 2018-05-19 MED ORDER — DEXTROSE 5 % SOLN FOR IMPELLA PURGE CATHETER
INTRAVENOUS | Status: DC
  Filled 2018-05-19: qty 1000

## 2018-05-19 MED ORDER — SODIUM CHLORIDE 0.9 % IV SOLN: INTRAVENOUS | Status: DC

## 2018-05-19 MED ORDER — SODIUM CHLORIDE 0.9 % IV SOLN
1.7500 mg/kg/h | INTRAVENOUS | Status: AC
  Filled 2018-05-19: qty 250

## 2018-05-19 MED ORDER — IOHEXOL 350 MG/ML SOLN
INTRAVENOUS | Status: DC | PRN
  Administered 2018-05-19: 150 mL via INTRA_ARTERIAL

## 2018-05-19 MED ORDER — INSULIN ASPART 100 UNIT/ML ~~LOC~~ SOLN
0.0000 [IU] | Freq: Every day | SUBCUTANEOUS | Status: DC
  Administered 2018-05-19 – 2018-05-20 (×2): 2 [IU] via SUBCUTANEOUS

## 2018-05-19 MED ORDER — HEPARIN SODIUM (PORCINE) 5000 UNIT/ML IJ SOLN
25000.0000 [IU] | INTRAVENOUS | Status: DC
  Administered 2018-05-19: 25000 [IU]
  Filled 2018-05-19: qty 5

## 2018-05-19 MED ORDER — ORAL CARE MOUTH RINSE
15.0000 mL | Freq: Two times a day (BID) | OROMUCOSAL | Status: DC
  Administered 2018-05-19 – 2018-05-25 (×5): 15 mL via OROMUCOSAL

## 2018-05-19 MED ORDER — DIAZEPAM 2 MG PO TABS
2.0000 mg | ORAL_TABLET | Freq: Four times a day (QID) | ORAL | Status: DC | PRN
  Administered 2018-05-19 – 2018-05-22 (×3): 2 mg via ORAL
  Filled 2018-05-19 (×3): qty 1

## 2018-05-19 MED ORDER — HEPARIN SODIUM (PORCINE) 5000 UNIT/ML IJ SOLN: 50000.0000 [IU] | INTRAVENOUS | Status: DC

## 2018-05-19 SURGICAL SUPPLY — 28 items
BALLN SAPPHIRE 3.0X12 (BALLOONS) ×2
BALLN SAPPHIRE ~~LOC~~ 4.0X12 (BALLOONS) ×1 IMPLANT
BALLN ~~LOC~~ EMERGE MR 4.0X15 (BALLOONS) ×2
BALLOON SAPPHIRE 3.0X12 (BALLOONS) IMPLANT
BALLOON ~~LOC~~ EMERGE MR 4.0X15 (BALLOONS) IMPLANT
CATH INFINITI 5FR MULTPACK ANG (CATHETERS) ×1 IMPLANT
CATH SWAN GANZ 7F STRAIGHT (CATHETERS) ×1 IMPLANT
CATH SWAN GANZ 7FR S TIP (CATHETERS) ×1 IMPLANT
CATH VISTA GUIDE 6FR XBLAD3.5 (CATHETERS) ×1 IMPLANT
CATH-GARD ARROW CATH SHIELD (MISCELLANEOUS) ×2
GUIDEWIRE .025 260CM (WIRE) ×1 IMPLANT
KIT ENCORE 26 ADVANTAGE (KITS) ×2 IMPLANT
KIT HEART LEFT (KITS) ×2 IMPLANT
PACK CARDIAC CATHETERIZATION (CUSTOM PROCEDURE TRAY) ×2 IMPLANT
SET IMPELLA CP PUMP (CATHETERS) ×1 IMPLANT
SHEATH PINNACLE 6F 10CM (SHEATH) ×2 IMPLANT
SHEATH PINNACLE 7F 10CM (SHEATH) ×1 IMPLANT
SHEATH PINNACLE 8F 10CM (SHEATH) ×1 IMPLANT
SHIELD CATHGARD ARROW (MISCELLANEOUS) IMPLANT
TRANSDUCER W/STOPCOCK (MISCELLANEOUS) ×2 IMPLANT
TUBING CIL FLEX 10 FLL-RA (TUBING) ×2 IMPLANT
WIRE ASAHI SION 190X3X12 .014 (WIRE) ×1 IMPLANT
WIRE EMERALD 3MM-J .025X260CM (WIRE) ×1 IMPLANT
WIRE EMERALD 3MM-J .035X150CM (WIRE) ×1 IMPLANT
WIRE EMERALD 3MM-J .035X260CM (WIRE) ×1 IMPLANT
WIRE EMERALD ST .035X260CM (WIRE) ×1 IMPLANT
WIRE HI TORQ WHISPER MS 190CM (WIRE) ×1 IMPLANT
WIRE PT2 MS 185 (WIRE) ×1 IMPLANT

## 2018-05-19 NOTE — ED Notes (Signed)
RN Hope informed of critical lab

## 2018-05-19 NOTE — H&P (Addendum)
Cardiology Admission History and Physical:   Patient ID: [name] MRN: [id number]; DOB: 1929-12-20   Admission date: 05/19/2018  Primary Care Provider: Jinny Sanders, MD Primary Cardiologist: Sherren Mocha, MD  Primary Electrophysiologist:  None   Chief Complaint:  Chest Pain   Patient Profile:   [name] is a 85 y.o. female with known CAD s/p NSTEMI in 2019 with cardiac cath showing critical stenosis of the distal LM extending into the proximal LAD and circumflex and chronic RCA TOC w/ collaterals, turned down for CABG due to advanced aged and poor functional capacity and later underwent atherectomy with Impella hemodynamic support and the left main bifurcation w/ stenting in November 2019, presenting back with unstable angina and EKG c/w acute STEMI.   History of Present Illness:   [name] is a 85 y.o. female with a hx of coronary artery disease and systolic heart failure.  The patient has a history of non-STEMI and heart failure that occurred during a hospitalization for a hip fracture in 2019.  At the time she was not well enough to undergo any invasive cardiac evaluation and she was treated medically.  She returned later in the year with ongoing symptoms of chest discomfort, exercise intolerance, weakness, and shortness of breath.  She underwent a repeat echocardiogram which showed improvement in LV function.  She underwent cardiac catheterization that demonstrated critical left main stenosis and chronic occlusion of the RCA.  She was turned down for heart surgery because of advanced age and poor functional capacity.  She underwent atherectomy with Impella hemodynamic support and left main bifurcation stenting in November 2019.  Her procedure was uncomplicated and she recovered quickly.  She was recently seen in clinic with Dr. Burt Knack on 05/02/18 and ws doing fairly well w/o angina and was compliant w/ her medications. 3 month f/u and repeat echo was recommended.   Pt  presented to the [facility] ED this morning with CC of substernal and left anterior chest pain. Started yesterday morning occurring of and on and gradually worsening. Worse with exertion and improved w/ SL NTG. She had associated nausea and dyspnea.   In the ED, EKG shows elevation in aVR and global ischemia. EKG patten concerning for critical LM disease. She was given ASA and started on IV heparin. CODE STEMI was activated and pt was taken to the [facility] Cath lab for emergent catheterization.    Past Medical History:  Diagnosis Date  . Anxiety   . Diaphragmatic hernia without mention of obstruction or gangrene   . Diverticulosis of colon (without mention of hemorrhage) 2009  . Fibromyalgia   . GERD (gastroesophageal reflux disease)   . Hiatal hernia 2009   s/p nissen fundoplication 0354.    Marland Kitchen Hx of adenomatous colonic polyps 2007, 2009   Adenomatous polyps in 2007 and 2009.  Hyperplastic polyps in 2009  . Hyperlipidemia   . IBS (irritable bowel syndrome)   . Internal hemorrhoids without mention of complication 6568   Seen at colonoscopy.  . Thyroid disease   . Type II or unspecified type diabetes mellitus with neurological manifestations, not stated as uncontrolled(250.60)     Past Surgical History:  Procedure Laterality Date  . ABDOMINAL HYSTERECTOMY    . BASAL CELL CARCINOMA EXCISION  04/2016   corner of left eye  . bmd  2007  . CATARACT EXTRACTION    . CORONARY ATHERECTOMY N/A 02/13/2018   Procedure: CORONARY ATHERECTOMY;  Surgeon: Sherren Mocha, MD;  Location: [place] [facility];  Service: Cardiovascular;  Laterality: N/A;  . ESOPHAGOGASTRODUODENOSCOPY  2007  . HERNIA REPAIR    . INTRAMEDULLARY (IM) NAIL INTERTROCHANTERIC Right 03/01/2017   Procedure: INTRAMEDULLARY (IM) NAIL INTERTROCHANTRIC;  Surgeon: Hiram Gash, MD;  Location: [place];  Service: Orthopedics;  Laterality: Right;  . LAPAROSCOPIC NISSEN FUNDOPLICATION  4332  . ORIF WRIST FRACTURE Right 03/01/2017   Procedure: OPEN  REDUCTION INTERNAL FIXATION (ORIF) WRIST FRACTURE;  Surgeon: Hiram Gash, MD;  Location: [place];  Service: Orthopedics;  Laterality: Right;  . RIGHT/LEFT HEART CATH AND CORONARY ANGIOGRAPHY N/A 02/07/2018   Procedure: RIGHT/LEFT HEART CATH AND CORONARY ANGIOGRAPHY;  Surgeon: Sherren Mocha, MD;  Location: [place] [facility];  Service: Cardiovascular;  Laterality: N/A;  . THYROIDECTOMY, PARTIAL    . VENTRICULAR ASSIST DEVICE INSERTION N/A 02/13/2018   Procedure: VENTRICULAR ASSIST DEVICE INSERTION;  Surgeon: Sherren Mocha, MD;  Location: [place] [facility];  Service: Cardiovascular;  Laterality: N/A;     Medications Prior to Admission: Prior to Admission medications   Medication Sig Start Date End Date Taking? Authorizing Provider  acetaminophen (TYLENOL) 500 MG tablet Take 1,000 mg by mouth at bedtime as needed for moderate pain.     [provider]  aspirin 81 MG tablet Take 81 mg by mouth daily.      [provider]  atorvastatin (LIPITOR) 40 MG tablet TAKE 1 TABLET BY MOUTH ONCE DAILY AT  [facility] 04/18/18   Bedsole, Amy E, MD  Carboxymethylcellulose Sodium (THERATEARS OP) Place 1 drop into both eyes daily as needed (dry eyes).    [provider]  carvedilol (COREG) 6.25 MG tablet TAKE 1 TABLET BY MOUTH TWICE DAILY WITH MEALS 04/12/18   Bedsole, Amy E, MD  clopidogrel (PLAVIX) 75 MG tablet Take 1 tablet (75 mg total) by mouth daily. 02/15/18   Cheryln Manly, NP  furosemide (LASIX) 40 MG tablet Take 1 tablet (40 mg total) by mouth daily. 01/17/18 01/12/19  Sherren Mocha, MD  insulin NPH Human (HUMULIN N,NOVOLIN N) 100 UNIT/ML injection Inject 15 Units into the skin at bedtime.     [provider]  losartan (COZAAR) 25 MG tablet Take 1 tablet (25 mg total) by mouth daily. 02/15/18   Cheryln Manly, NP  nitroGLYCERIN (NITROSTAT) 0.4 MG SL tablet Place 1 tablet (0.4 mg total) under the tongue every 5 (five) minutes as needed. 02/15/18   Cheryln Manly, NP    NOVOLOG FLEXPEN 100 UNIT/ML FlexPen INJECT 10 UNITS SUBCUTANEOUSLY THREE TIMES DAILY WITH MEALS 11/13/17   Bedsole, Amy E, MD  ONE TOUCH ULTRA TEST test strip USE 1 STRIP TO CHECK GLUCOSE 4 TIMES DAILY 01/26/18   Bedsole, Amy E, MD  pantoprazole (PROTONIX) 40 MG tablet Take 1 tablet (40 mg total) by mouth 2 (two) times daily. 05/02/18 04/27/19  Sherren Mocha, MD  potassium chloride (K-DUR) 10 MEQ tablet Take 1 tablet (10 mEq total) by mouth daily. 01/17/18 01/12/19  Sherren Mocha, MD     Allergies:    Allergies  Allergen Reactions  . Metformin     unspecified  . Penicillins     unspecified Has patient had a PCN reaction causing immediate rash, facial/tongue/throat swelling, SOB or lightheadedness with hypotension: Unknown Has patient had a PCN reaction causing severe rash involving mucus membranes or skin necrosis: Unknown Has patient had a PCN reaction that required hospitalization: No Has patient had a PCN reaction occurring within the last 10 years: No If all of the above answers  are "NO", then may proceed with Cephalosporin use.   . Codeine Palpitations    Rapid heart rate  . Fenofibrate Palpitations  . Pioglitazone Rash    Social History:   Social History   Socioeconomic History  . Marital status: Widowed    Spouse name: Not on file  . Number of children: 3  . Years of education: Not on file  . Highest education level: Not on file  Occupational History  . Occupation: retired    Fish farm manager: RETIRED  Social Needs  . Financial resource strain: Not on file  . Food insecurity:    Worry: Not on file    Inability: Not on file  . Transportation needs:    Medical: Not on file    Non-medical: Not on file  Tobacco Use  . Smoking status: Former Smoker    Packs/day: 0.25    Years: 4.00    Pack years: 1.00    Types: Cigarettes    Last attempt to quit: 03/10/1981    Years since quitting: 37.2  . Smokeless tobacco: Never Used  Substance and Sexual Activity  . Alcohol use: No     Alcohol/week: 0.0 standard drinks  . Drug use: No  . Sexual activity: Never  Lifestyle  . Physical activity:    Days per week: Not on file    Minutes per session: Not on file  . Stress: Not on file  Relationships  . Social connections:    Talks on phone: Not on file    Gets together: Not on file    Attends religious service: Not on file    Active member of club or organization: Not on file    Attends meetings of clubs or organizations: Not on file    Relationship status: Not on file  . Intimate partner violence:    Fear of current or ex partner: Not on file    Emotionally abused: Not on file    Physically abused: Not on file    Forced sexual activity: Not on file  Other Topics Concern  . Not on file  Social History Narrative   Widowed   3 children          Family History:   The patient's family history includes Breast cancer in her paternal aunt; Cancer in her mother; Diabetes in her maternal grandfather and son; Heart disease in her paternal grandmother; Hyperlipidemia in her son; Uterine cancer in her mother. There is no history of Colon cancer or Stomach cancer.    ROS:  Please see the history of present illness.  All other ROS reviewed and negative.     Physical Exam/Data:   Vitals:   05/19/18 1306 05/19/18 1311 05/19/18 1315 05/19/18 1320  BP: 120/86     Pulse: (!) 0 (!) 113 (!) 162 (!) 0  Resp: (!) 0 (!) 0 (!) 0 (!) 0  Temp:      TempSrc:      SpO2: (!) 85% (!) 0% (!) 0% (!) 0%  Weight:      Height:        Intake/Output Summary (Last 24 hours) at 05/19/2018 1351 Last data filed at 05/19/2018 1136 Gross per 24 hour  Intake 10.7 ml  Output -  Net 10.7 ml   Last 3 Weights 05/19/2018 05/02/2018 02/21/2018  Weight (lbs) 143 lb 1.3 oz 143 lb 1.9 oz 141 lb 1.9 oz  Weight (kg) 64.9 kg 64.919 kg 64.012 kg     Body mass index is 25.35  kg/m.  General:  Elderly WF, in distress  HEENT: normal Lymph: no adenopathy Neck: no JVD Endocrine:  No  thryomegaly Vascular: No carotid bruits; FA pulses 2+ bilaterally without bruits  Cardiac:  normal S1, S2; RRR; no murmur  Lungs:  clear to auscultation bilaterally, no wheezing, rhonchi or rales  Abd: soft, nontender, no hepatomegaly  Ext: no edema Musculoskeletal:  No deformities, BUE and BLE strength normal and equal Skin: warm and dry  Neuro:  CNs 2-12 intact, no focal abnormalities noted Psych:  Normal affect    EKG:  The ECG that was done 05/19/18 was personally reviewed and demonstrates sinus tach 100 bpm severe global ischemia (LM/MVD)  Relevant CV Studies: Prior Cardiac Cath 02/2018  Procedures   CORONARY ATHERECTOMY  VENTRICULAR ASSIST DEVICE INSERTION  Conclusion     Ost LM to Dist LM lesion is 95% stenosed.  Ost LAD lesion is 90% stenosed.  Ost Cx to Prox Cx lesion is 80% stenosed.  Ost 2nd Mrg to 2nd Mrg lesion is 50% stenosed.  Prox RCA lesion is 100% stenosed.  A drug-eluting stent was successfully placed using a STENT SIERRA 3.00 X 18 MM.  Post intervention, there is a 0% residual stenosis.  Post intervention, there is a 0% residual stenosis.  A stent was successfully placed.  Post intervention, there is a 0% residual stenosis.   Successful complex left main bifurcation stenting (Culotte technique) with adjuvant atherectomy and hemodynamic support with an Impella CP device.   Recommend dual antiplatelet therapy with Aspirin 81mg  daily and Clopidogrel 75mg  daily long-term (beyond 12 months) because of complex left main stenting - 12 months DAPT minimum favor long-term if tolerated).   Prior Echo 01/2018 Study Conclusions  - Left ventricle: The cavity size was normal. Wall thickness was   normal. Systolic function was normal. The estimated ejection   fraction was in the range of 55% to 60%. Wall motion was normal;   there were no regional wall motion abnormalities. Left   ventricular diastolic function parameters were normal. - Aortic valve:  Valve mobility was restricted. There was moderate   stenosis. Valve area (VTI): 1.69 cm^2. Valve area (Vmax): 1.01   cm^2. Valve area (Vmean): 1.72 cm^2. - Mitral valve: Calcified annulus. Valve area by pressure   half-time: 2 cm^2. Valve area by continuity equation (using LVOT   flow): 2.82 cm^2. - Left atrium: The atrium was mildly dilated.  Impressions:  - Compared to November 2018, there is substantial improvement in   left ventricular function due to recovery of anteroapical   contractility. Increase aoertic valve gradients are due to   improved LV function, not due to worsening stenosis.  Emergent LHC 05/19/18- Report pending   Laboratory Data:  Chemistry Recent Labs  Lab 05/19/18 0833  NA 138  K 4.2  CL 105  CO2 20*  GLUCOSE 183*  BUN 26*  CREATININE 1.14*  CALCIUM 9.5  GFRNONAA 44*  GFRAA 50*  ANIONGAP 13    No results for input(s): PROT, ALBUMIN, AST, ALT, ALKPHOS, BILITOT in the last 168 hours. Hematology Recent Labs  Lab 05/19/18 0833  WBC 13.8*  RBC 4.68  HGB 12.2  HCT 39.3  MCV 84.0  MCH 26.1  MCHC 31.0  RDW 14.6  PLT 271   Cardiac EnzymesNo results for input(s): TROPONINI in the last 168 hours.  Recent Labs  Lab 05/19/18 0836  TROPIPOC 1.44*    BNPNo results for input(s): BNP, PROBNP in the last 168 hours.  DDimer No  results for input(s): DDIMER in the last 168 hours.  Radiology/Studies:  Dg Chest Portable 1 View  Result Date: 05/19/2018 CLINICAL DATA:  Chest pain and shortness of breath for 1 day. EXAM: PORTABLE CHEST 1 VIEW COMPARISON:  05/16/2017 FINDINGS: The cardiac silhouette, mediastinal and hilar contours are stable. There is moderate tortuosity and calcification of the thoracic aorta. Underlying emphysematous changes with superimposed interstitial process and small pleural effusions. Thickened peripheral inter septal lines (Kerley B lines suggesting this is interstitial edema. No definite infiltrates. IMPRESSION: Cardiac enlargement,  small effusions and interstitial edema suggesting CHF. Electronically Signed   By: Marijo Sanes M.D.   On: 05/19/2018 09:06    Assessment and Plan:   [name] is a 85 y.o. female with known CAD s/p NSTEMI in 2019 with cardiac cath showing critical stenosis of the distal LM extending into the proximal LAD and circumflex and chronic TOC of the RCA w/ collaterals, turned down for CABG due to advanced aged and poor functional capacity and later underwent atherectomy with Impella hemodynamic support and the left main bifurcation w/ stenting in November 2019, presenting back with unstable angina and EKG c/w acute STEMI.   1. CAD/ Acute STEMI: EKG showed ST elevations in aVR, severe global ischemia (LM/MVD). Pt taken to the cath lab for emergent catheterization and found to have 95% occluded LM treated w/ PCI. Required impella support. Also required defibrillation x 2 during case. Pt being transported to ICU for further care. Post cath orders to be written by Dr. Claiborne Billings. Plavix stopped. Brilinta added. Continue ASA, and heparin. Resume statin, Lipitor. Was on 40 mg outpatient. May need further titration to 80. Check FLP.   For questions or updates, please contact [place] Please consult [email or web address] for contact info under      Signed, Lyda Jester, PA-C  05/19/2018 1:51 PM    Patient seen and examined. Agree with assessment and plan.  [name] is an 85 year old female who has previously documented severe coronary obstructive disease and had been turned down for surgical revascularization.  She underwent high-risk complex atherectomy/PTCA and stenting with Impella hemodynamic support with left main ostial LAD and circumflex stenting on February 13, 2018 by Dr. Burt Knack.  She was recently seen in his office several weeks ago and was doing excellent without recurrent symptomatology.,  She became more short of breath and ultimately presented to the emergency room where her ECG revealed  new 3 ST segment elevation in lead aVR and 3 to 4 mm of global ST segment depression in all other leads highly suggestive of left main restenosis.  Patient was started on IV heparin.  A code STEMI was activated.  In the ER she became progressively more short of breath and was noted to have rales.  Once the cath team arrived she was emergently taken to the catheterization laboratory.  In anticipation of emergent pre-PCI Impella insertion, I had contacted the Impella clinical consultant who will be in route from [facility] to provide assistance.  I had also contacted Dr. Burt Knack who knows the patient well from her prior complex intervention.  I discussed the high risk procedure with the patient's son and plan emergent catheterization with hemodynamic support prior to attempted intervention to a probably subtotally occluded left main in this patient with previously documented total RCA occlusion.  Patient is aware of the benefits of the high risk procedure eluding potential death.  We discussed potential need for intubation and possible defibrillation and she agrees to proceed.  Troy Sine, MD, [facility] 05/19/2018 3:24 PM

## 2018-05-19 NOTE — Progress Notes (Signed)
Dr. Georgette Shell at bedside, coordinating with Impella rep and awaiting Echo tech

## 2018-05-19 NOTE — Progress Notes (Signed)
ANTICOAGULATION CONSULT NOTE - Initial Consult  Pharmacy Consult for Heparin w/ Impella Indication: Impella patency  Allergies  Allergen Reactions  . Metformin Other (See Comments)    unspecified  . Penicillins     unspecified Has patient had a PCN reaction causing immediate rash, facial/tongue/throat swelling, SOB or lightheadedness with hypotension: Unknown Has patient had a PCN reaction causing severe rash involving mucus membranes or skin necrosis: Unknown Has patient had a PCN reaction that required hospitalization: No Has patient had a PCN reaction occurring within the last 10 years: No If all of the above answers are "NO", then may proceed with Cephalosporin use.   . Codeine Palpitations    Rapid heart rate  . Fenofibrate Palpitations  . Pioglitazone Rash    Patient Measurements: Height: 5\' 3"  (160 cm) Weight: 143 lb 1.3 oz (64.9 kg) IBW/kg (Calculated) : 52.4 Heparin Dosing Weight: 64.9 kg  Vital Signs: Temp: 97.7 F (36.5 C) (02/08 0809) Temp Source: Oral (02/08 0809) BP: 120/86 (02/08 1306) Pulse Rate: 96 (02/08 1445)  Labs: Recent Labs    05/19/18 0833 05/19/18 1501  HGB 12.2 11.6*  HCT 39.3 34.0*  PLT 271  --   CREATININE 1.14*  --     Estimated Creatinine Clearance: 32.1 mL/min (A) (by C-G formula based on SCr of 1.14 mg/dL (H)).   Medical History: Past Medical History:  Diagnosis Date  . Anxiety   . Diaphragmatic hernia without mention of obstruction or gangrene   . Diverticulosis of colon (without mention of hemorrhage) 2009  . Fibromyalgia   . GERD (gastroesophageal reflux disease)   . Hiatal hernia 2009   s/p nissen fundoplication 2353.    Marland Kitchen Hx of adenomatous colonic polyps 2007, 2009   Adenomatous polyps in 2007 and 2009.  Hyperplastic polyps in 2009  . Hyperlipidemia   . IBS (irritable bowel syndrome)   . Internal hemorrhoids without mention of complication 6144   Seen at colonoscopy.  . Thyroid disease   . Type II or unspecified  type diabetes mellitus with neurological manifestations, not stated as uncontrolled(250.60)     Medications:  Infusions:  . sodium chloride 20 mL/hr at 05/19/18 1440  . sodium chloride    . bivalirudin (ANGIOMAX) infusion 5 mg/mL (Cath Lab,ACS,PCI indication)    . impella catheter heparin 25 unit/mL in dextrose 5%    . heparin    . nitroGLYCERIN Stopped (05/19/18 1034)  . norepinephrine    . norepinephrine (LEVOPHED) Adult infusion 4 mcg/min (05/19/18 1440)    Assessment: 85 yo female s/p STEMI w/ LM disease, now on Impella.  Currently has bivalirudin running x 4 hrs after cath, ends at 1645 pm today.  Adding heparin to impella purge solution at half-strength (25 units/hr).  Currently purge rate is 10.4 ml/hr, so will receive 250 units/hr of heparin.  Goal of Therapy:  ACT 160-180 Monitor platelets by anticoagulation protocol: Yes   Plan:  1. Bivalirudin off at 1645 PM. 2. Adding heparin to purge solution (25 units/ml). 3. Check ACT at end of bivalirudin and then every 1-2 hrs. 4. Initiate systemic heparin if needed to maintain ACT 160-180. 5. Call pharmacy if any questions.  Marguerite Olea, [facility] Clinical Pharmacist Phone [phone]  05/19/2018 3:25 PM

## 2018-05-19 NOTE — Progress Notes (Addendum)
[place] for Heparin w/ Impella Indication: Impella patency  Allergies  Allergen Reactions  . Metformin Other (See Comments)    unspecified  . Penicillins     unspecified Has patient had a PCN reaction causing immediate rash, facial/tongue/throat swelling, SOB or lightheadedness with hypotension: Unknown Has patient had a PCN reaction causing severe rash involving mucus membranes or skin necrosis: Unknown Has patient had a PCN reaction that required hospitalization: No Has patient had a PCN reaction occurring within the last 10 years: No If all of the above answers are "NO", then may proceed with Cephalosporin use.   . Codeine Palpitations    Rapid heart rate  . Fenofibrate Palpitations  . Pioglitazone Rash    Patient Measurements: Height: 5\' 3"  (160 cm) Weight: 151 lb 0.2 oz (68.5 kg) IBW/kg (Calculated) : 52.4 Heparin Dosing Weight: 64.9 kg  Vital Signs: Temp: 98.7 F (37.1 C) (02/08 1641) Temp Source: Axillary (02/08 1641) BP: 120/86 (02/08 1306) Pulse Rate: 92 (02/08 1900)  Labs: Recent Labs    05/19/18 0833 05/19/18 1501 05/19/18 1844  HGB 12.2 11.6* 12.3  HCT 39.3 34.0* 39.6  PLT 271  --  268  CREATININE 1.14*  --   --     Estimated Creatinine Clearance: 32.9 mL/min (A) (by C-G formula based on SCr of 1.14 mg/dL (H)).   Medical History: Past Medical History:  Diagnosis Date  . Anxiety   . Diaphragmatic hernia without mention of obstruction or gangrene   . Diverticulosis of colon (without mention of hemorrhage) 2009  . Fibromyalgia   . GERD (gastroesophageal reflux disease)   . Hiatal hernia 2009   s/p nissen fundoplication 5053.    Marland Kitchen Hx of adenomatous colonic polyps 2007, 2009   Adenomatous polyps in 2007 and 2009.  Hyperplastic polyps in 2009  . Hyperlipidemia   . IBS (irritable bowel syndrome)   . Internal hemorrhoids without mention of complication 9767   Seen at colonoscopy.  . Thyroid disease   .  Type II or unspecified type diabetes mellitus with neurological manifestations, not stated as uncontrolled(250.60)     Medications:  Infusions:  . sodium chloride 20 mL/hr at 05/19/18 1800  . sodium chloride    . impella catheter heparin 25 unit/mL in dextrose 5%    . heparin Stopped (05/19/18 1851)  . nitroGLYCERIN Stopped (05/19/18 1034)  . norepinephrine (LEVOPHED) Adult infusion Stopped (05/19/18 1652)    Assessment: 85 yo female s/p STEMI w/ LM disease, now on Impella.  Currently has bivalirudin running x 4 hrs after cath, ended at 1645 pm today. Heparin to impella purge solution at half-strength (25 units/hr).  Currently purge rate is 12 ml/hr, so receiving 300 units/hr of heparin. -ACT= 200 per RN  Goal of Therapy:  ACT 160-180 Monitor platelets by anticoagulation protocol: Yes   Plan:  -repeat ACT in 2 hours -Will need to consider D5W only in purge if ACT remains elevated   Hildred Laser, PharmD Clinical Pharmacist **Pharmacist phone directory can now be found on [email or web address] ([email or web address]).  Listed under [place].  Addendum -increased bleeding noted at groin site -Act= 158  Plan -Continue heparin through the purge (no change in concentration) -Consider systemic heparin if ACT continues to be < 160 but will need to weigh bleeding risks -check ACT q2h  Hildred Laser, PharmD Clinical Pharmacist **Pharmacist phone directory can now be found on [email or web address] ([email or web address]).  Listed under [place].

## 2018-05-19 NOTE — Progress Notes (Signed)
   05/19/18 1400  Clinical Encounter Type  Visited With Family;Health care provider  Visit Type Follow-up;Psychological support;Patient in surgery  Spiritual Encounters  Spiritual Needs Emotional  Stress Factors  Patient Stress Factors Not reviewed  Family Stress Factors Loss of control;Lack of knowledge   When walking through [place] waiting rm, [place] family (pt's son [name] and his brother's wife) asked me if I knew anything.  I gathered their questions, spoke w/ med team in cath lab, and conveyed what Dr. Claiborne Billings had as brief updates.    [name] resident, [phone]

## 2018-05-19 NOTE — Progress Notes (Signed)
  Echocardiogram 2D Echocardiogram has been performed.  Jennette Dubin 05/19/2018, 2:49 PM

## 2018-05-19 NOTE — Progress Notes (Signed)
   05/19/18 0900  Clinical Encounter Type  Visited With Patient and family together;Health care provider  Visit Type Initial;ED;Code (STEMI)  Referral From Other (Comment) (STEMI pg)  Spiritual Encounters  Spiritual Needs Emotional  Stress Factors  Patient Stress Factors Major life changes;Health changes  Family Stress Factors Loss of control   Responded to STEMI pg, spoke w/ pt and her son [phone], his cell #) in ED pt room w/ RN present.  Pt verbalized that she wasn't sure she would "make it." Asked her about that, she said she was "ready to go" and "knew where she was going," and therefore wasn't scared or worried.  Son tearful when we left to go to 2nd fl, he declined to go up to [facility] waiting at this time, stepped out to his truck and declined offer for me to wait for him to return to walk him up.  Ck'd in with Cath Lab and let them know where he is and wrote cell # down.  Chaplain remains available.  [name] resident, [phone]

## 2018-05-19 NOTE — Progress Notes (Signed)
ANTICOAGULATION CONSULT NOTE - Initial Consult  Pharmacy Consult for Heparin Indication: chest pain/ACS  Allergies  Allergen Reactions  . Metformin     unspecified  . Penicillins     unspecified Has patient had a PCN reaction causing immediate rash, facial/tongue/throat swelling, SOB or lightheadedness with hypotension: Unknown Has patient had a PCN reaction causing severe rash involving mucus membranes or skin necrosis: Unknown Has patient had a PCN reaction that required hospitalization: No Has patient had a PCN reaction occurring within the last 10 years: No If all of the above answers are "NO", then may proceed with Cephalosporin use.   . Codeine Palpitations    Rapid heart rate  . Fenofibrate Palpitations  . Pioglitazone Rash    Patient Measurements: Height: 5\' 3"  (160 cm) Weight: 143 lb 1.3 oz (64.9 kg) IBW/kg (Calculated) : 52.4 Heparin Dosing Weight:  64.9kg  Vital Signs: Temp: 97.7 F (36.5 C) (02/08 0809) Temp Source: Oral (02/08 0809) BP: 99/73 (02/08 0815) Pulse Rate: 103 (02/08 0815)  Labs: No results for input(s): HGB, HCT, PLT, APTT, LABPROT, INR, HEPARINUNFRC, HEPRLOWMOCWT, CREATININE, CKTOTAL, CKMB, TROPONINI in the last 72 hours.  CrCl cannot be calculated (Patient's most recent lab result is older than the maximum 21 days allowed.).   Medical History: Past Medical History:  Diagnosis Date  . Anxiety   . Diaphragmatic hernia without mention of obstruction or gangrene   . Diverticulosis of colon (without mention of hemorrhage) 2009  . Fibromyalgia   . GERD (gastroesophageal reflux disease)   . Hiatal hernia 2009   s/p nissen fundoplication 5027.    Marland Kitchen Hx of adenomatous colonic polyps 2007, 2009   Adenomatous polyps in 2007 and 2009.  Hyperplastic polyps in 2009  . Hyperlipidemia   . IBS (irritable bowel syndrome)   . Internal hemorrhoids without mention of complication 7412   Seen at colonoscopy.  . Thyroid disease   . Type II or unspecified  type diabetes mellitus with neurological manifestations, not stated as uncontrolled(250.60)    Assessment: CC/HPI: CP since 2/7. With recent stent 11/19  PMH: NSTEMI/CAD, HF, hip fx 2019, anxiety, hernia, diverticulosis, fibromyalgia, GERD, HH, h/o adenomatous colon polyps, HLD, IBS, hemorrhoids, thyroid dz, DM  Anticoag: IV heparin for CP  Goal of Therapy:  Heparin level 0.3-0.7 units/ml Monitor platelets by anticoagulation protocol: Yes   Plan:  F/u baseline CBC Start IV heparin 4000 unit bolus Heparin infusion at 900 units/hr Check heparin level in 6-8 hrs Daily HL and CBC   Ladamien Rammel S. Alford Highland, PharmD, [place] Clinical Staff Pharmacist Eilene Ghazi Stillinger 05/19/2018,8:37 AM

## 2018-05-19 NOTE — Progress Notes (Addendum)
ANTICOAGULATION CONSULT NOTE - Follow Up Consult  Pharmacy Consult for heparin Indication: Impella  Labs: Recent Labs    05/19/18 0833 05/19/18 1501 05/19/18 1844 05/19/18 2001  HGB 12.2 11.6* 12.3  --   HCT 39.3 34.0* 39.6  --   PLT 271  --  268  --   CREATININE 1.14*  --   --  1.69*  TROPONINI  --   --   --  52.81*    Assessment: Spoke w/ RN re: heparin for Impella; ACT continues to drop, now 147; bleeding now resolved at groin, urine remains tea-colored; RN has confirmed w/ cards that it is okay to start systemic heparin for Impella patency.  Goal of Therapy:  ACT 160-180   Plan:  Will start start heparin gtt at 200 units/hr and monitor ACT Q2H; this will be a nurse-driven protocol.  Wynona Neat, PharmD, BCPS  05/19/2018,11:06 PM   Addendum: ACT down 142 > increase systemic heparin to 300 units/hr.   VB 05/20/2018 1:23 AM   Addendum: ACT 147 > increase systemic heparin to 400 units/hr. VB 5:32 AM   Addendum: ACT 142 > increase systemic heparin to 500 units/hr. VB 7:14 AM

## 2018-05-19 NOTE — ED Notes (Signed)
Pt's belongings given to pt's son. Pt placed on zoll with cath lab pads

## 2018-05-19 NOTE — ED Notes (Signed)
Pt placed on nonrebreather, sats 92%. Pt worsening SOB. MD at bedside discussing pt's wishes. Pt states she does not want compressions. She is a DNR

## 2018-05-19 NOTE — ED Triage Notes (Signed)
Pt from home with son with c/o CP that began yesterday. Pt describes pain (10/10) as pressure upon inhalation.  Pt reports recent cardiac stent placement. Pt reports nausea with one episode of vomiting.

## 2018-05-19 NOTE — ED Notes (Addendum)
NS started per verbal order from Dr. Claiborne Billings, gentle hydration as pt lungs are progressively sounding more crackly.

## 2018-05-19 NOTE — Progress Notes (Signed)
Pt admitted to [place] at 1330. Impella rep at bedside. Levophed titrated slowly to 36mcg to maintain BP as per order. Pt reporting improved respiratory status from admission. On 3L [place] to maintain sats >94. Unable to doppler pulses below the femoral level BLE- Dr. Claiborne Billings aware per CCL report.

## 2018-05-19 NOTE — Progress Notes (Signed)
  Echocardiogram 2D Echocardiogram has been performed.  [name] 05/19/2018, 9:27 PM

## 2018-05-19 NOTE — ED Notes (Signed)
Obtained consent for procedure 

## 2018-05-19 NOTE — Progress Notes (Signed)
Peripherally Inserted Central Catheter/Midline Placement  The IV Nurse has discussed with the patient and/or persons authorized to consent for the patient, the purpose of this procedure and the potential benefits and risks involved with this procedure.  The benefits include less needle sticks, lab draws from the catheter, and the patient may be discharged home with the catheter. Risks include, but not limited to, infection, bleeding, blood clot (thrombus formation), and puncture of an artery; nerve damage and irregular heartbeat and possibility to perform a PICC exchange if needed/ordered by physician.  Alternatives to this procedure were also discussed.  Bard Power PICC patient education guide, fact sheet on infection prevention and patient information card has been provided to patient /or left at bedside.    PICC/Midline Placement Documentation  PICC Double Lumen 05/19/18 PICC Right Brachial 40 cm 0 cm (Active)  Indication for Insertion or Continuance of Line Vasoactive infusions 05/19/2018  5:55 PM  Exposed Catheter (cm) 0 cm 05/19/2018  5:55 PM  Site Assessment Clean;Dry;Intact 05/19/2018  5:55 PM  Lumen #1 Status Flushed;Saline locked;Blood return noted 05/19/2018  5:55 PM  Lumen #2 Status Flushed;Saline locked;Blood return noted 05/19/2018  5:55 PM  Dressing Type Transparent;Securing device 05/19/2018  5:55 PM  Dressing Status Clean;Dry;Intact;Antimicrobial disc in place 05/19/2018  5:55 PM  Line Care Connections checked and tightened 05/19/2018  5:55 PM  Dressing Intervention New dressing 05/19/2018  5:55 PM  Dressing Change Due 05/26/18 05/19/2018  5:55 PM       Virgilio Belling 05/19/2018, 5:56 PM

## 2018-05-19 NOTE — Progress Notes (Signed)
CRITICAL VALUE ALERT  Critical Value:  Coox 42, Trop 52.81 Also discussed CMET, LDH, and urine  Date & Time Notied:  05/19/2018  Provider Notified: Dr. Laverta Baltimore  Orders Received/Actions taken: discussed plan to keep Impella at P7, recheck coox at 2300, and monitor other labs

## 2018-05-19 NOTE — ED Notes (Signed)
Cardiology at bedside speaking with pt and pt's son. At this time, pt has decided to have emergency cath and will proceed CPR, shock, meds if needed.

## 2018-05-19 NOTE — ED Notes (Signed)
Pt's sats 87% on room air. Applied 2L [place]. Pt complains of worsening CP, diaphoretic, states "it is hard to breathe." MD notified

## 2018-05-19 NOTE — Consult Note (Signed)
Advanced Heart Failure Team Consult Note   Primary Physician: Jinny Sanders, MD PCP-Cardiologist:  Sherren Mocha, MD  Reason for Consultation: Cardiogenic shock  HPI:    [name] is seen today for evaluation of cardiogenic shock at the request of Dr. Claiborne Billings.   [name] a 85 y.o.femalewith a hx of coronary artery disease and diastolic heart failure. The patient has a history of non-STEMI and heart failure that occurred during a hospitalization for a hip fracture in 2019. At the time she was not well enough to undergo any invasive cardiac evaluation and she was treated medically. She returned later in the year with ongoing symptoms of chest discomfort, exercise intolerance, weakness, and shortness of breath. She underwent a repeat echocardiogram which showed improvement in LV function. She underwent cardiac catheterization that demonstrated critical left main stenosis and chronic occlusion of the RCA. She was turned down for heart surgery because of advanced age and poor functional capacity. She underwent atherectomy with Impella hemodynamic support and left main bifurcation stenting in November 2019. Her procedure was uncomplicated and she recovered quickly. She was recently seen in clinic with Dr. Burt Knack on 05/02/18 and was doing fairly well w/o angina and was compliant w/ her medications. 3 month f/u and repeat echo was recommended.   Pt presented to the [facility] ED this morning with severe substernal chest pain. Started yesterday morning occurring of and on and gradually worsening. Worse with exertion and improved w/ SL NTG. She had associated nausea and dyspnea.   In the ED, EKG showed elevation in aVR and diffuse ST depression concerning for global ischemia. EKG patten concerning for critical LM disease. She was given ASA and started on IV heparin. CODE STEMI was activated and pt was taken to the [facility] Cath lab for emergent catheterization.   In the cath lab, she was  found to have recurrent critical left main stenosis (in-stent restenosis), 95+%.  RCA was chronically occluded.  She had Impella placement followed by successful PCI to left main.  Unable to place Swan catheter post-PCI, patient currently only has a femoral central line.  VT during case, defibrillation x 2.   Currently, she reports increased dyspnea.  She had Lasix 80 mg IV in the cath lab.  Impella flow 3.7 L/min at P9 with no alarms.  SBP in 110s currently, norepinephrine titrated down to 3.  Unable to doppler peripheral pulses in either foot.   Echo: Personally reviewed.  EF 20%, diffuse hypokinesis.  Mild RV dysfunction.  Impella catheter in LV, unable to assess degree of aortic stenosis. Moderate MR.   Review of Systems: All systems reviewed and negative except as per HPI.   Home Medications Prior to Admission medications   Medication Sig Start Date End Date Taking? Authorizing Provider  acetaminophen (TYLENOL) 500 MG tablet Take 1,000 mg by mouth at bedtime as needed for moderate pain.    Yes [provider]  aspirin 81 MG tablet Take 81 mg by mouth daily.     Yes [provider]  atorvastatin (LIPITOR) 40 MG tablet TAKE 1 TABLET BY MOUTH ONCE DAILY AT  6PM Patient taking differently: Take 40 mg by mouth daily.  04/18/18  Yes Bedsole, Amy E, MD  Carboxymethylcellulose Sodium (THERATEARS OP) Place 1 drop into both eyes daily as needed (dry eyes).   Yes [provider]  carvedilol (COREG) 6.25 MG tablet TAKE 1 TABLET BY MOUTH TWICE DAILY WITH MEALS Patient taking differently: Take 6.25 mg by  mouth 2 (two) times daily with a meal.  04/12/18  Yes Bedsole, Amy E, MD  clopidogrel (PLAVIX) 75 MG tablet Take 1 tablet (75 mg total) by mouth daily. 02/15/18  Yes Cheryln Manly, NP  furosemide (LASIX) 40 MG tablet Take 1 tablet (40 mg total) by mouth daily. 01/17/18 01/12/19 Yes Sherren Mocha, MD  insulin NPH Human (HUMULIN N,NOVOLIN N) 100 UNIT/ML injection Inject 15 Units  into the skin at bedtime.    Yes [provider]  losartan (COZAAR) 25 MG tablet Take 1 tablet (25 mg total) by mouth daily. 02/15/18  Yes Cheryln Manly, NP  nitroGLYCERIN (NITROSTAT) 0.4 MG SL tablet Place 1 tablet (0.4 mg total) under the tongue every 5 (five) minutes as needed. 02/15/18  Yes Reino Bellis B, NP  NOVOLOG FLEXPEN 100 UNIT/ML FlexPen INJECT 10 UNITS SUBCUTANEOUSLY THREE TIMES DAILY WITH MEALS Patient taking differently: Inject 10 Units into the skin 3 (three) times daily with meals.  11/13/17  Yes Bedsole, Amy E, MD  pantoprazole (PROTONIX) 40 MG tablet Take 1 tablet (40 mg total) by mouth 2 (two) times daily. 05/02/18 04/27/19 Yes Sherren Mocha, MD  potassium chloride (K-DUR) 10 MEQ tablet Take 1 tablet (10 mEq total) by mouth daily. 01/17/18 01/12/19 Yes Sherren Mocha, MD  ONE TOUCH ULTRA TEST test strip USE 1 STRIP TO CHECK GLUCOSE 4 TIMES DAILY 01/26/18   Jinny Sanders, MD    Past Medical History: Past Medical History:  Diagnosis Date  . Anxiety   . Diaphragmatic hernia without mention of obstruction or gangrene   . Diverticulosis of colon (without mention of hemorrhage) 2009  . Fibromyalgia   . GERD (gastroesophageal reflux disease)   . Hiatal hernia 2009   s/p nissen fundoplication 6503.    Marland Kitchen Hx of adenomatous colonic polyps 2007, 2009   Adenomatous polyps in 2007 and 2009.  Hyperplastic polyps in 2009  . Hyperlipidemia   . IBS (irritable bowel syndrome)   . Internal hemorrhoids without mention of complication 5465   Seen at colonoscopy.  . Thyroid disease   . Type II or unspecified type diabetes mellitus with neurological manifestations, not stated as uncontrolled(250.60)     Past Surgical History: Past Surgical History:  Procedure Laterality Date  . ABDOMINAL HYSTERECTOMY    . BASAL CELL CARCINOMA EXCISION  04/2016   corner of left eye  . bmd  2007  . CATARACT EXTRACTION    . CORONARY ATHERECTOMY N/A 02/13/2018   Procedure: CORONARY  ATHERECTOMY;  Surgeon: Sherren Mocha, MD;  Location: [place] [facility];  Service: Cardiovascular;  Laterality: N/A;  . ESOPHAGOGASTRODUODENOSCOPY  2007  . HERNIA REPAIR    . INTRAMEDULLARY (IM) NAIL INTERTROCHANTERIC Right 03/01/2017   Procedure: INTRAMEDULLARY (IM) NAIL INTERTROCHANTRIC;  Surgeon: Hiram Gash, MD;  Location: [facility];  Service: Orthopedics;  Laterality: Right;  . LAPAROSCOPIC NISSEN FUNDOPLICATION  6812  . ORIF WRIST FRACTURE Right 03/01/2017   Procedure: OPEN REDUCTION INTERNAL FIXATION (ORIF) WRIST FRACTURE;  Surgeon: Hiram Gash, MD;  Location: [facility];  Service: Orthopedics;  Laterality: Right;  . RIGHT/LEFT HEART CATH AND CORONARY ANGIOGRAPHY N/A 02/07/2018   Procedure: RIGHT/LEFT HEART CATH AND CORONARY ANGIOGRAPHY;  Surgeon: Sherren Mocha, MD;  Location: [facility];  Service: Cardiovascular;  Laterality: N/A;  . THYROIDECTOMY, PARTIAL    . VENTRICULAR ASSIST DEVICE INSERTION N/A 02/13/2018   Procedure: VENTRICULAR ASSIST DEVICE INSERTION;  Surgeon: Sherren Mocha, MD;  Location: [place] [facility];  Service: Cardiovascular;  Laterality: N/A;    Family History: Family History  Problem Relation Age of Onset  . Uterine cancer Mother   . Cancer Mother   . Diabetes Maternal Grandfather   . Diabetes Son   . Hyperlipidemia Son   . Heart disease Paternal Grandmother   . Breast cancer Paternal Aunt   . Colon cancer Neg Hx   . Stomach cancer Neg Hx     Social History: Social History   Socioeconomic History  . Marital status: Widowed    Spouse name: Not on file  . Number of children: 3  . Years of education: Not on file  . Highest education level: Not on file  Occupational History  . Occupation: retired    Fish farm manager: RETIRED  Social Needs  . Financial resource strain: Not on file  . Food insecurity:    Worry: Not on file    Inability: Not on file  . Transportation needs:    Medical: Not on file    Non-medical: Not on file  Tobacco Use  .  Smoking status: Former Smoker    Packs/day: 0.25    Years: 4.00    Pack years: 1.00    Types: Cigarettes    Last attempt to quit: 03/10/1981    Years since quitting: 37.2  . Smokeless tobacco: Never Used  Substance and Sexual Activity  . Alcohol use: No    Alcohol/week: 0.0 standard drinks  . Drug use: No  . Sexual activity: Never  Lifestyle  . Physical activity:    Days per week: Not on file    Minutes per session: Not on file  . Stress: Not on file  Relationships  . Social connections:    Talks on phone: Not on file    Gets together: Not on file    Attends religious service: Not on file    Active member of club or organization: Not on file    Attends meetings of clubs or organizations: Not on file    Relationship status: Not on file  Other Topics Concern  . Not on file  Social History Narrative   Widowed   3 children          Allergies:  Allergies  Allergen Reactions  . Metformin Other (See Comments)    unspecified  . Penicillins     unspecified Has patient had a PCN reaction causing immediate rash, facial/tongue/throat swelling, SOB or lightheadedness with hypotension: Unknown Has patient had a PCN reaction causing severe rash involving mucus membranes or skin necrosis: Unknown Has patient had a PCN reaction that required hospitalization: No Has patient had a PCN reaction occurring within the last 10 years: No If all of the above answers are "NO", then may proceed with Cephalosporin use.   . Codeine Palpitations    Rapid heart rate  . Fenofibrate Palpitations  . Pioglitazone Rash    Objective:    Vital Signs:   Temp:  [97.7 F (36.5 C)] 97.7 F (36.5 C) (02/08 0809) Pulse Rate:  [0-226] 97 (02/08 1530) Resp:  [0-69] 28 (02/08 1530) BP: (72-138)/(24-97) 120/86 (02/08 1306) SpO2:  [0 %-100 %] 95 % (02/08 1530) Arterial Line BP: (108-131)/(71-80) 113/72 (02/08 1530) Weight:  [64.9 kg] 64.9 kg (02/08 0811) Last BM Date: 05/18/18  Weight  change: Filed Weights   05/19/18 0811  Weight: 64.9 kg    Intake/Output:   Intake/Output Summary (Last 24 hours) at 05/19/2018 1613 Last data filed at 05/19/2018 1500 Gross per 24 hour  Intake 501.3  ml  Output -  Net 501.3 ml      Physical Exam    General:  Dyspneic.  HEENT: normal Neck: supple. JVP 10 cm. Carotids 2+ bilat; no bruits. No lymphadenopathy or thyromegaly appreciated. Cor: PMI nondisplaced. Regular rate & rhythm. No rubs, gallops or murmurs. LVAD sounds noted.  Lungs: clear Abdomen: soft, nontender, mildly distended. No hepatosplenomegaly. No bruits or masses. Good bowel sounds. Extremities: no clubbing, rash, edema.  Feet are cool, unable to palpate or doppler pulses in either foot.  Neuro: alert & orientedx3, cranial nerves grossly intact. moves all 4 extremities w/o difficulty. Affect pleasant   Telemetry   NSR 90s (personally reviewed)  EKG    NSR, ST elevation AVR and diffuse ST depression in multiple other leads (personally reviewed)  Labs   Basic Metabolic Panel: Recent Labs  Lab 05/19/18 0833 05/19/18 1501  NA 138 138  K 4.2 5.5*  CL 105  --   CO2 20*  --   GLUCOSE 183*  --   BUN 26*  --   CREATININE 1.14*  --   CALCIUM 9.5  --     Liver Function Tests: No results for input(s): AST, ALT, ALKPHOS, BILITOT, PROT, ALBUMIN in the last 168 hours. No results for input(s): LIPASE, AMYLASE in the last 168 hours. No results for input(s): AMMONIA in the last 168 hours.  CBC: Recent Labs  Lab 05/19/18 0833 05/19/18 1501  WBC 13.8*  --   HGB 12.2 11.6*  HCT 39.3 34.0*  MCV 84.0  --   PLT 271  --     Cardiac Enzymes: No results for input(s): CKTOTAL, CKMB, CKMBINDEX, TROPONINI in the last 168 hours.  BNP: BNP (last 3 results) No results for input(s): BNP in the last 8760 hours.  ProBNP (last 3 results) Recent Labs    01/17/18 1122  PROBNP 1,194*     CBG: No results for input(s): GLUCAP in the last 168 hours.  Coagulation  Studies: No results for input(s): LABPROT, INR in the last 72 hours.   Imaging   Dg Chest Portable 1 View  Result Date: 05/19/2018 CLINICAL DATA:  Chest pain and shortness of breath for 1 day. EXAM: PORTABLE CHEST 1 VIEW COMPARISON:  05/16/2017 FINDINGS: The cardiac silhouette, mediastinal and hilar contours are stable. There is moderate tortuosity and calcification of the thoracic aorta. Underlying emphysematous changes with superimposed interstitial process and small pleural effusions. Thickened peripheral inter septal lines (Kerley B lines suggesting this is interstitial edema. No definite infiltrates. IMPRESSION: Cardiac enlargement, small effusions and interstitial edema suggesting CHF. Electronically Signed   By: Marijo Sanes M.D.   On: 05/19/2018 09:06   [place] Ekg Site Rite  Result Date: 05/19/2018 If Site Rite image not attached, placement could not be confirmed due to current cardiac rhythm.  [place] Ekg Site Rite  Result Date: 05/19/2018 If [facility] image not attached, placement could not be confirmed due to current cardiac rhythm.     Medications:     Current Medications: . aspirin  81 mg Oral Daily  . atorvastatin  80 mg Oral q1800  . furosemide  80 mg Intravenous BID  . mouth rinse  15 mL Mouth Rinse BID  . sodium chloride flush  3 mL Intravenous Q12H  . ticagrelor  90 mg Oral BID     Infusions: . sodium chloride 20 mL/hr at 05/19/18 1500  . sodium chloride    . bivalirudin (ANGIOMAX) infusion 5 mg/mL (Cath Lab,ACS,PCI indication)    .  impella catheter heparin 25 unit/mL in dextrose 5%    . heparin    . nitroGLYCERIN Stopped (05/19/18 1034)  . norepinephrine (LEVOPHED) Adult infusion 2 mcg/min (05/19/18 1500)       Patient Profile   [name] is a 85 y.o. female with known CAD s/p NSTEMI in 2019 with cardiac cath showing critical stenosis of the distal LM extending into the proximal LAD and circumflex and chronic TOC of the RCA w/ collaterals, turned down for  CABG due to advanced aged and poor functional capacity and later underwent atherectomy with Impella hemodynamic support and the left main bifurcation w/ stenting in November 2019, presenting back with unstable angina and EKG c/w acute STEMI.   Assessment/Plan   1. CAD: STEMI with 95+% left main, now s/p PCI to left main with Impella support.  She has known chronic occlusion of RCA.  Currently, no chest pain.  - Continue ASA 81 and ticagrelor.  - Continue atorvastatin 80 mg daily.  2. Cardiogenic shock: In setting of acute MI.  Echo reviewed, EF down from normal range on most recent echo to 20% today.  Ischemic cardiomyopathy.  Impella in place, running at P9 with good flow and no alarms. On exam, she is volume overloaded with dyspnea and CHF on initial CXR.  - Lasix 80 mg IV bid, will give dose now.  - Needs central access other than femoral venous catheter to follow CVP and co-ox.  Will order PICC now, if unable to place PICC, would have CCM place CVL.  - Continue Impella at P9 for now.  Would like to wean as soon as feasible with poor peripheral circulation.  Cool feet bilaterally, unable to palpate or doppler pulses.  As findings are bilateral, ?pre-existing significant PAD.  Regardless, wean off Impella as soon as feasible.  - Norepinephrine has been weaned to 2, can continue to wean to off.  - Follow LDH, will check Impella position by echo in am.   - Will need to continue heparin gtt while Impella in place.  3. Aortic stenosis: Moderate on echo in 10/19.  Unable to assess on today's echo given presence of Impella catheter.   4. Type II DM: SSI  Length of Stay: 0  Loralie Champagne, MD  05/19/2018, 4:13 PM  Advanced Heart Failure Team Pager [phone] (M-F; 7a - 4p)  Please contact [facility] for night-coverage after hours (4p -7a ) and weekends on [email or web address]

## 2018-05-19 NOTE — Progress Notes (Signed)
   05/19/18 1000  Clinical Encounter Type  Visited With Family;Health care provider  Visit Type Follow-up   Found pt's son [name] in hospital hallway looking for [name] waiting, walked him there and let cath lab know he was now there.  Emotional support.  Son also said that his sister (pt's daughter) is sick so while she had planned to come here, she was not at this time.  [name] resident, [phone]

## 2018-05-19 NOTE — Progress Notes (Signed)
At change of shift bedside handoff, L groin site bleeding had completely saturated dressing. Dressing removed, manual pressure held. Fellow paged and notified of ACT, increasing bleeding at site and in foley. During this period pt became nauseous and had episode of vomiting. VSS.

## 2018-05-19 NOTE — ED Provider Notes (Signed)
[facility] EMERGENCY DEPARTMENT Provider Note   CSN: [id number] Arrival date & time: 05/19/18  0756     History   Chief Complaint No chief complaint on file.   HPI [name] is a 85 y.o. female.  HPI   88yF with with CP. Substernal and L anterior chest. Doesn't radiate. Began yesterday morning. Seemed to ease off initially and then worsening again. Has been constant. Worse with exertion. Some improvement with nitroglycerin. Associated nausea and dyspnea. No cough. No fever or chills. No unusual leg pain or swelling. Reports compliance with medications.   Past Medical History:  Diagnosis Date  . Anxiety   . Diaphragmatic hernia without mention of obstruction or gangrene   . Diverticulosis of colon (without mention of hemorrhage) 2009  . Fibromyalgia   . GERD (gastroesophageal reflux disease)   . Hiatal hernia 2009   s/p nissen fundoplication 0865.    Marland Kitchen Hx of adenomatous colonic polyps 2007, 2009   Adenomatous polyps in 2007 and 2009.  Hyperplastic polyps in 2009  . Hyperlipidemia   . IBS (irritable bowel syndrome)   . Internal hemorrhoids without mention of complication 7846   Seen at colonoscopy.  . Thyroid disease   . Type II or unspecified type diabetes mellitus with neurological manifestations, not stated as uncontrolled(250.60)     Patient Active Problem List   Diagnosis Date Noted  . Coronary artery disease involving native coronary artery of native heart with unstable angina pectoris ([place])   . Coronary artery disease due to calcified coronary lesion   . PAF (paroxysmal atrial fibrillation) ([place]) 02/08/2018  . Aortic stenosis 02/08/2018  . Chronic combined systolic and diastolic heart failure ([place]) 02/07/2018  . Left main coronary artery disease 02/07/2018  . History of hip fracture 01/05/2018  . History of non-ST elevation myocardial infarction (NSTEMI) 05/05/2017  . Occult blood positive stool   . Pressure injury of skin 03/06/2017  .  Closed right hip fracture ([place]) 03/01/2017  . Carotid stenosis, asymptomatic, bilateral 01/12/2017  . Osteoporosis 07/19/2016  . History of basal cell cancer 07/12/2016  . Seborrheic dermatitis 07/12/2016  . Allergic rhinitis 07/12/2016  . Falls frequently 11/03/2015  . Right humeral fracture 11/03/2015  . Systolic murmur 96/29/5284  . Counseling regarding end of life decision making 02/20/2015  . Hypertension complicating diabetes ([place]) 02/20/2015  . Microalbuminuria due to type 2 diabetes mellitus ([place]) 11/06/2014  . Hematuria 11/04/2014  . Hx of partial thyroidectomy 11/04/2014  . Chronic constipation 11/04/2014  . PAD (peripheral artery disease) ([place]) 12/06/2011  . Claudication, intermittent ([place]) 12/06/2011  . Chronic total occlusion of artery of the extremities ([place]) 09/15/2011  . Hyperlipidemia 10/08/2007  . Diabetic peripheral neuropathy associated with type 2 diabetes mellitus ([place]) 05/11/2007  . CALLUSES, FEET, BILATERAL 05/11/2007  . Type 2 diabetes mellitus with neurological complications ([place]) 13/24/4010  . GERD 09/25/2006  . HIATAL HERNIA 09/25/2006    Past Surgical History:  Procedure Laterality Date  . ABDOMINAL HYSTERECTOMY    . BASAL CELL CARCINOMA EXCISION  04/2016   corner of left eye  . bmd  2007  . CATARACT EXTRACTION    . CORONARY ATHERECTOMY N/A 02/13/2018   Procedure: CORONARY ATHERECTOMY;  Surgeon: Sherren Mocha, MD;  Location: [facility];  Service: Cardiovascular;  Laterality: N/A;  . ESOPHAGOGASTRODUODENOSCOPY  2007  . HERNIA REPAIR    . INTRAMEDULLARY (IM) NAIL INTERTROCHANTERIC Right 03/01/2017   Procedure: INTRAMEDULLARY (IM) NAIL INTERTROCHANTRIC;  Surgeon: Hiram Gash, MD;  Location: [facility];  Service: Orthopedics;  Laterality: Right;  . LAPAROSCOPIC NISSEN FUNDOPLICATION  5956  . ORIF WRIST FRACTURE Right 03/01/2017   Procedure: OPEN REDUCTION INTERNAL FIXATION (ORIF) WRIST FRACTURE;  Surgeon: Hiram Gash, MD;  Location: [facility];   Service: Orthopedics;  Laterality: Right;  . RIGHT/LEFT HEART CATH AND CORONARY ANGIOGRAPHY N/A 02/07/2018   Procedure: RIGHT/LEFT HEART CATH AND CORONARY ANGIOGRAPHY;  Surgeon: Sherren Mocha, MD;  Location: [facility];  Service: Cardiovascular;  Laterality: N/A;  . THYROIDECTOMY, PARTIAL    . VENTRICULAR ASSIST DEVICE INSERTION N/A 02/13/2018   Procedure: VENTRICULAR ASSIST DEVICE INSERTION;  Surgeon: Sherren Mocha, MD;  Location: [facility];  Service: Cardiovascular;  Laterality: N/A;     OB History   No obstetric history on file.      Home Medications    Prior to Admission medications   Medication Sig Start Date End Date Taking? Authorizing Provider  acetaminophen (TYLENOL) 500 MG tablet Take 1,000 mg by mouth at bedtime as needed for moderate pain.     [provider]  aspirin 81 MG tablet Take 81 mg by mouth daily.      [provider]  atorvastatin (LIPITOR) 40 MG tablet TAKE 1 TABLET BY MOUTH ONCE DAILY AT  [facility] 04/18/18   Bedsole, Amy E, MD  Carboxymethylcellulose Sodium (THERATEARS OP) Place 1 drop into both eyes daily as needed (dry eyes).    [provider]  carvedilol (COREG) 6.25 MG tablet TAKE 1 TABLET BY MOUTH TWICE DAILY WITH MEALS 04/12/18   Bedsole, Amy E, MD  clopidogrel (PLAVIX) 75 MG tablet Take 1 tablet (75 mg total) by mouth daily. 02/15/18   Cheryln Manly, NP  furosemide (LASIX) 40 MG tablet Take 1 tablet (40 mg total) by mouth daily. 01/17/18 01/12/19  Sherren Mocha, MD  insulin NPH Human (HUMULIN N,NOVOLIN N) 100 UNIT/ML injection Inject 15 Units into the skin at bedtime.     [provider]  losartan (COZAAR) 25 MG tablet Take 1 tablet (25 mg total) by mouth daily. 02/15/18   Cheryln Manly, NP  nitroGLYCERIN (NITROSTAT) 0.4 MG SL tablet Place 1 tablet (0.4 mg total) under the tongue every 5 (five) minutes as needed. 02/15/18   Cheryln Manly, NP  NOVOLOG FLEXPEN 100 UNIT/ML FlexPen INJECT 10 UNITS  SUBCUTANEOUSLY THREE TIMES DAILY WITH MEALS 11/13/17   Bedsole, Amy E, MD  ONE TOUCH ULTRA TEST test strip USE 1 STRIP TO CHECK GLUCOSE 4 TIMES DAILY 01/26/18   Bedsole, Amy E, MD  pantoprazole (PROTONIX) 40 MG tablet Take 1 tablet (40 mg total) by mouth 2 (two) times daily. 05/02/18 04/27/19  Sherren Mocha, MD  potassium chloride (K-DUR) 10 MEQ tablet Take 1 tablet (10 mEq total) by mouth daily. 01/17/18 01/12/19  Sherren Mocha, MD    Family History Family History  Problem Relation Age of Onset  . Uterine cancer Mother   . Cancer Mother   . Diabetes Maternal Grandfather   . Diabetes Son   . Hyperlipidemia Son   . Heart disease Paternal Grandmother   . Breast cancer Paternal Aunt   . Colon cancer Neg Hx   . Stomach cancer Neg Hx     Social History Social History   Tobacco Use  . Smoking status: Former Smoker    Packs/day: 0.25    Years: 4.00    Pack years: 1.00    Types: Cigarettes    Last attempt to quit: 03/10/1981  Years since quitting: 37.2  . Smokeless tobacco: Never Used  Substance Use Topics  . Alcohol use: No    Alcohol/week: 0.0 standard drinks  . Drug use: No     Allergies   Metformin; Penicillins; Codeine; Fenofibrate; and Pioglitazone   Review of Systems Review of Systems  All systems reviewed and negative, other than as noted in HPI.  Physical Exam Updated Vital Signs BP 98/71 (BP Location: Right Arm)   Pulse (!) 103   Temp 97.7 F (36.5 C) (Oral)   Resp (!) 22   Ht 5\' 3"  (1.6 m)   Wt 64.9 kg   SpO2 96%   BMI 25.35 kg/m   Physical Exam Vitals signs and nursing note reviewed.  Constitutional:      Appearance: She is well-developed. She is ill-appearing and toxic-appearing.  HENT:     Head: Normocephalic and atraumatic.  Eyes:     General:        Right eye: No discharge.        Left eye: No discharge.     Conjunctiva/sclera: Conjunctivae normal.  Neck:     Musculoskeletal: Neck supple.  Cardiovascular:     Rate and Rhythm: Regular  rhythm.     Heart sounds: Normal heart sounds. No murmur. No friction rub. No gallop.      Comments: Mild tachycardia Pulmonary:     Effort: Pulmonary effort is normal. No respiratory distress.     Breath sounds: Normal breath sounds.  Abdominal:     General: There is no distension.     Palpations: Abdomen is soft.     Tenderness: There is no abdominal tenderness.  Musculoskeletal:        General: No tenderness.  Skin:    General: Skin is warm and dry.  Neurological:     Mental Status: She is alert.  Psychiatric:        Behavior: Behavior normal.        Thought Content: Thought content normal.      ED Treatments / Results  Labs (all labs ordered are listed, but only abnormal results are displayed) Labs Reviewed  BASIC METABOLIC PANEL - Abnormal; Notable for the following components:      Result Value   CO2 20 (*)    Glucose, Bld 183 (*)    BUN 26 (*)    Creatinine, Ser 1.14 (*)    GFR calc non Af Amer 44 (*)    GFR calc Af Amer 50 (*)    All other components within normal limits  CBC - Abnormal; Notable for the following components:   WBC 13.8 (*)    All other components within normal limits  I-STAT TROPONIN, ED - Abnormal; Notable for the following components:   Troponin i, poc 1.44 (*)    All other components within normal limits  HEPARIN LEVEL (UNFRACTIONATED)    EKG EKG Interpretation  Date/Time:  Saturday May 19 2018 08:06:54 EST Ventricular Rate:  100 PR Interval:    QRS Duration: 81 QT Interval:  308 QTC Calculation: 398 R Axis:   18 Text Interpretation:  Sinus tachycardia Repol abnrm, severe global ischemia (LM/MVD) Confirmed by Virgel Manifold [phone]) on 05/19/2018 8:11:31 AM   Radiology No results found.  Procedures Procedures (including critical care time)  CRITICAL CARE Performed by: Virgel Manifold Total critical care time: 40 minutes Critical care time was exclusive of separately billable procedures and treating other  patients. Critical care was necessary to treat or prevent imminent or life-threatening  deterioration. Critical care was time spent personally by me on the following activities: development of treatment plan with patient and/or surrogate as well as nursing, discussions with consultants, evaluation of patient's response to treatment, examination of patient, obtaining history from patient or surrogate, ordering and performing treatments and interventions, ordering and review of laboratory studies, ordering and review of radiographic studies, pulse oximetry and re-evaluation of patient's condition.   Medications Ordered in ED Medications  nitroGLYCERIN 50 mg in dextrose 5 % 250 mL (0.2 mg/mL) infusion (5 mcg/min Intravenous New Bag/Given 05/19/18 0834)  fentaNYL (SUBLIMAZE) injection 25 mcg (25 mcg Intravenous Given 05/19/18 0847)  heparin bolus via infusion 4,000 Units (has no administration in time range)  heparin ADULT infusion 100 units/mL (25000 units/268mL sodium chloride 0.45%) (has no administration in time range)  aspirin chewable tablet 324 mg (324 mg Oral Given 05/19/18 0829)  sodium chloride flush (NS) 0.9 % injection 3 mL (3 mLs Intravenous Given 05/19/18 0836)  ondansetron (ZOFRAN) injection 4 mg (4 mg Intravenous Given 05/19/18 0847)     Initial Impression / Assessment and Plan / ED Course  I have reviewed the triage vital signs and the nursing notes.  Pertinent labs & imaging results that were available during my care of the patient were reviewed by me and considered in my medical decision making (see chart for details).     88yF with CP. EKG abnormal with elevation in aVR and global ischemia. Concerning for critical L main disease, particularly with her known anatomy. Ongoing symptoms. ASA/heparin ordered. Nitro gtt.  Beta blocker deferred.  Discussed with Dr Claiborne Billings. To to go for urgent cath. Discussed with inpatient team.  Final Clinical Impressions(s) / ED Diagnoses   Final diagnoses:   Unstable angina [facility])    ED Discharge Orders    None       Virgel Manifold, MD 05/23/18 1246

## 2018-05-20 LAB — COOXEMETRY PANEL
Carboxyhemoglobin: 1.5 % (ref 0.5–1.5)
Carboxyhemoglobin: 1.4 % (ref 0.5–1.5)
Methemoglobin: 1.8 % — ABNORMAL HIGH (ref 0.0–1.5)
Methemoglobin: 1.7 % — ABNORMAL HIGH (ref 0.0–1.5)
O2 SAT: 69 %
O2 Saturation: 58.4 %
Total hemoglobin: 11.1 g/dL — ABNORMAL LOW (ref 12.0–16.0)
Total hemoglobin: 10.4 g/dL — ABNORMAL LOW (ref 12.0–16.0)

## 2018-05-20 LAB — BASIC METABOLIC PANEL
Anion gap: 11 (ref 5–15)
BUN: 46 mg/dL — ABNORMAL HIGH (ref 8–23)
CO2: 21 mmol/L — AB (ref 22–32)
Calcium: 8.4 mg/dL — ABNORMAL LOW (ref 8.9–10.3)
Chloride: 107 mmol/L (ref 98–111)
Creatinine, Ser: 1.91 mg/dL — ABNORMAL HIGH (ref 0.44–1.00)
GFR calc Af Amer: 27 mL/min — ABNORMAL LOW (ref >60)
GFR calc non Af Amer: 23 mL/min — ABNORMAL LOW (ref >60)
Glucose, Bld: 250 mg/dL — ABNORMAL HIGH (ref 70–99)
Potassium: 4.3 mmol/L (ref 3.5–5.1)
Sodium: 139 mmol/L (ref 135–145)

## 2018-05-20 LAB — GLUCOSE, CAPILLARY
Glucose-Capillary: 214 mg/dL — ABNORMAL HIGH (ref 70–99)
Glucose-Capillary: 300 mg/dL — ABNORMAL HIGH (ref 70–99)
Glucose-Capillary: 252 mg/dL — ABNORMAL HIGH (ref 70–99)
Glucose-Capillary: 200 mg/dL — ABNORMAL HIGH (ref 70–99)

## 2018-05-20 LAB — POCT ACTIVATED CLOTTING TIME
ACTIVATED CLOTTING TIME: 153 s
ACTIVATED CLOTTING TIME: 169 s
Activated Clotting Time: 318 seconds
Activated Clotting Time: 142 seconds
Activated Clotting Time: 147 seconds
Activated Clotting Time: 147 seconds
Activated Clotting Time: 142 seconds
Activated Clotting Time: 142 seconds
Activated Clotting Time: 142 seconds
Activated Clotting Time: 186 seconds
Activated Clotting Time: 142 seconds
Activated Clotting Time: 142 seconds
Activated Clotting Time: 142 seconds
Activated Clotting Time: 147 seconds
Activated Clotting Time: 147 seconds
Activated Clotting Time: 153 seconds
Activated Clotting Time: 164 seconds
Activated Clotting Time: 164 seconds

## 2018-05-20 LAB — CBC
HEMATOCRIT: 34.4 % — AB (ref 36.0–46.0)
Hemoglobin: 10.8 g/dL — ABNORMAL LOW (ref 12.0–15.0)
MCH: 26 pg (ref 26.0–34.0)
MCHC: 31.4 g/dL (ref 30.0–36.0)
MCV: 82.7 fL (ref 80.0–100.0)
Platelets: 223 10*3/uL (ref 150–400)
RBC: 4.16 MIL/uL (ref 3.87–5.11)
RDW: 14.8 % (ref 11.5–15.5)
WBC: 21.1 10*3/uL — ABNORMAL HIGH (ref 4.0–10.5)
nRBC: 0 % (ref 0.0–0.2)

## 2018-05-20 LAB — HEPATIC FUNCTION PANEL
ALT: 26 U/L (ref 0–44)
AST: 218 U/L — ABNORMAL HIGH (ref 15–41)
Albumin: 2.8 g/dL — ABNORMAL LOW (ref 3.5–5.0)
Alkaline Phosphatase: 100 U/L (ref 38–126)
BILIRUBIN TOTAL: 1.4 mg/dL — AB (ref 0.3–1.2)
Bilirubin, Direct: 0.4 mg/dL — ABNORMAL HIGH (ref 0.0–0.2)
Indirect Bilirubin: 1 mg/dL — ABNORMAL HIGH (ref 0.3–0.9)
Total Protein: 6.2 g/dL — ABNORMAL LOW (ref 6.5–8.1)

## 2018-05-20 LAB — LACTATE DEHYDROGENASE: LDH: 1133 U/L — ABNORMAL HIGH (ref 98–192)

## 2018-05-20 MED ORDER — MILRINONE LACTATE IN DEXTROSE 20-5 MG/100ML-% IV SOLN
0.2500 ug/kg/min | INTRAVENOUS | Status: DC
  Filled 2018-05-20: qty 100

## 2018-05-20 MED ORDER — INSULIN ASPART 100 UNIT/ML ~~LOC~~ SOLN
0.0000 [IU] | Freq: Three times a day (TID) | SUBCUTANEOUS | Status: DC
  Administered 2018-05-21: 4 [IU] via SUBCUTANEOUS
  Administered 2018-05-21: 11 [IU] via SUBCUTANEOUS
  Administered 2018-05-21: 7 [IU] via SUBCUTANEOUS
  Administered 2018-05-21: 4 [IU] via SUBCUTANEOUS
  Administered 2018-05-22: 7 [IU] via SUBCUTANEOUS

## 2018-05-20 MED ORDER — MILRINONE LACTATE IN DEXTROSE 20-5 MG/100ML-% IV SOLN
0.1250 ug/kg/min | INTRAVENOUS | Status: DC
  Administered 2018-05-20 (×2): 0.25 ug/kg/min via INTRAVENOUS
  Administered 2018-05-22: 0.125 ug/kg/min via INTRAVENOUS
  Filled 2018-05-20 (×2): qty 100

## 2018-05-20 MED ORDER — SODIUM CHLORIDE 0.9 % IV SOLN
INTRAVENOUS | Status: AC
  Administered 2018-05-20 (×2): via INTRAVENOUS

## 2018-05-20 MED ORDER — SODIUM CHLORIDE 0.9 % IV SOLN
INTRAVENOUS | Status: AC
  Administered 2018-05-20: 21:00:00 via INTRAVENOUS

## 2018-05-20 MED ORDER — AMIODARONE LOAD VIA INFUSION
150.0000 mg | Freq: Once | INTRAVENOUS | Status: DC | PRN
  Filled 2018-05-20: qty 83.34

## 2018-05-20 MED ORDER — ATROPINE SULFATE 1 MG/10ML IJ SOSY
PREFILLED_SYRINGE | INTRAMUSCULAR | Status: AC
  Filled 2018-05-20: qty 10

## 2018-05-20 MED ORDER — AMIODARONE HCL IN DEXTROSE 360-4.14 MG/200ML-% IV SOLN
30.0000 mg/h | INTRAVENOUS | Status: DC
  Administered 2018-05-20 – 2018-05-22 (×6): 30 mg/h via INTRAVENOUS
  Filled 2018-05-20 (×7): qty 200

## 2018-05-20 MED ORDER — SODIUM CHLORIDE 0.9 % IV SOLN: INTRAVENOUS | Status: DC

## 2018-05-20 MED ORDER — PANTOPRAZOLE SODIUM 40 MG PO TBEC
40.0000 mg | DELAYED_RELEASE_TABLET | Freq: Two times a day (BID) | ORAL | Status: DC
  Administered 2018-05-20 – 2018-05-25 (×10): 40 mg via ORAL
  Filled 2018-05-20 (×10): qty 1

## 2018-05-20 NOTE — Progress Notes (Signed)
Patient ID: [name], female   DOB: 04/24/1929, 85 y.o.   MRN: [id number]     Advanced Heart Failure Rounding Note  PCP-Cardiologist: [name] Mocha, MD   Subjective:    Overnight, noted to be hemolyzing with tea-colored urine and rising LDH.  Impella position visualized under echo with device rep, found to be in adequate position (3.8 cm).  No alarms on Impella.  Device decreased to P7 to try to decrease hemolysis.  This morning, urine is less red. Flow 3.3 L/min.   CVP 4 on my measure today, co-ox 44%.  LDH 1133, hgb 10.8, WBCs 21. Creatinine 1.67=>1.91. Tbili 1.4.   She remains in NSR, on heparin gtt. Afebrile.  SBP 100s generally this morning.    Objective:   Weight Range: 66.2 kg Body mass index is 25.85 kg/m.   Vital Signs:   Temp:  [96.8 F (36 C)-98.9 F (37.2 C)] 98.5 F (36.9 C) (02/09 0400) Pulse Rate:  [0-226] 87 (02/09 0645) Resp:  [0-69] 20 (02/09 0645) BP: (72-138)/(24-97) 94/65 (02/09 0645) SpO2:  [0 %-100 %] 97 % (02/09 0645) Arterial Line BP: (97-145)/(44-89) 108/55 (02/09 0540) Weight:  [64.9 kg-68.5 kg] 66.2 kg (02/09 0500) Last BM Date: 05/18/18  Weight change: Filed Weights   05/19/18 0811 05/19/18 1900 05/20/18 0500  Weight: 64.9 kg 68.5 kg 66.2 kg    Intake/Output:   Intake/Output Summary (Last 24 hours) at 05/20/2018 0701 Last data filed at 05/20/2018 0600 Gross per 24 hour  Intake 1106.25 ml  Output 815 ml  Net 291.25 ml      Physical Exam    General:  Well appearing. No resp difficulty HEENT: Normal Neck: Supple. JVP not elevated. Carotids 2+ bilat; no bruits. No lymphadenopathy or thyromegaly appreciated. Cor: PMI nondisplaced. Regular rate & rhythm. 2/6 SEM RUSB.  Impella sounds.  Lungs: Clear Abdomen: Soft, nontender, nondistended. No hepatosplenomegaly. No bruits or masses. Good bowel sounds. Extremities: No cyanosis, clubbing, rash, edema. Peripheral pulses dopplerable on both feet.  Neuro: Alert & orientedx3, cranial  nerves grossly intact. moves all 4 extremities w/o difficulty. Affect pleasant   Telemetry   NSR 80s (personally reviewed)  Labs    CBC Recent Labs    05/19/18 1844 05/20/18 0502  WBC 20.0* 21.1*  NEUTROABS 15.6*  --   HGB 12.3 10.8*  HCT 39.6 34.4*  MCV 84.6 82.7  PLT 268 297   Basic Metabolic Panel Recent Labs    05/19/18 2208 05/20/18 0502  NA 140 139  K 4.1 4.3  CL 105 107  CO2 19* 21*  GLUCOSE 233* 250*  BUN 40* 46*  CREATININE 1.67* 1.91*  CALCIUM 8.6* 8.4*   Liver Function Tests Recent Labs    05/19/18 2208 05/20/18 0502  AST 197* 218*  ALT 21 26  ALKPHOS 109 100  BILITOT 2.2* 1.4*  PROT 6.5 6.2*  ALBUMIN 3.1* 2.8*   No results for input(s): LIPASE, AMYLASE in the last 72 hours. Cardiac Enzymes Recent Labs    05/19/18 2001 05/19/18 2208  TROPONINI 52.81* 59.66*    BNP: BNP (last 3 results) Recent Labs    05/19/18 1850  BNP 870.3*    ProBNP (last 3 results) Recent Labs    01/17/18 1122  PROBNP 1,194*     D-Dimer No results for input(s): DDIMER in the last 72 hours. Hemoglobin A1C No results for input(s): HGBA1C in the last 72 hours. Fasting Lipid Panel No results for input(s): CHOL, HDL, LDLCALC, TRIG, CHOLHDL, LDLDIRECT in the last  72 hours. Thyroid Function Tests No results for input(s): TSH, T4TOTAL, T3FREE, THYROIDAB in the last 72 hours.  Invalid input(s): FREET3  Other results:   Imaging    Dg Chest Port 1 View  Result Date: 05/19/2018 CLINICAL DATA:  Congestive heart failure EXAM: PORTABLE CHEST 1 VIEW COMPARISON:  Chest radiograph 05/19/2018 FINDINGS: Ventricular assist device. Stable cardiomegaly. Tortuosity of the thoracic aorta. Mild interstitial opacities bilaterally. Small bilateral pleural effusions. IMPRESSION: Cardiomegaly with suggestion of mild interstitial edema. Electronically Signed   By: Lovey Newcomer M.D.   On: 05/19/2018 19:14   Dg Chest Portable 1 View  Result Date: 05/19/2018 CLINICAL DATA:   Chest pain and shortness of breath for 1 day. EXAM: PORTABLE CHEST 1 VIEW COMPARISON:  05/16/2017 FINDINGS: The cardiac silhouette, mediastinal and hilar contours are stable. There is moderate tortuosity and calcification of the thoracic aorta. Underlying emphysematous changes with superimposed interstitial process and small pleural effusions. Thickened peripheral inter septal lines (Kerley B lines suggesting this is interstitial edema. No definite infiltrates. IMPRESSION: Cardiac enlargement, small effusions and interstitial edema suggesting CHF. Electronically Signed   By: Marijo Sanes M.D.   On: 05/19/2018 09:06   [place] Ekg Site Rite  Result Date: 05/19/2018 If Site Rite image not attached, placement could not be confirmed due to current cardiac rhythm.  [place] Ekg Site Rite  Result Date: 05/19/2018 If [facility] image not attached, placement could not be confirmed due to current cardiac rhythm.     Medications:     Scheduled Medications: . aspirin  81 mg Oral Daily  . atorvastatin  80 mg Oral q1800  . Chlorhexidine Gluconate Cloth  6 each Topical Daily  . insulin aspart  0-15 Units Subcutaneous TID WC  . insulin aspart  0-5 Units Subcutaneous QHS  . mouth rinse  15 mL Mouth Rinse BID  . sodium chloride flush  10-40 mL Intracatheter Q12H  . sodium chloride flush  3 mL Intravenous Q12H  . ticagrelor  90 mg Oral BID     Infusions: . sodium chloride 20 mL/hr at 05/20/18 0600  . sodium chloride    . sodium chloride    . impella catheter heparin 25 unit/mL in dextrose 5%    . heparin 400 Units/hr (05/20/18 0600)  . milrinone    . nitroGLYCERIN Stopped (05/19/18 1034)  . norepinephrine (LEVOPHED) Adult infusion Stopped (05/19/18 1652)     PRN Medications:  sodium chloride, acetaminophen, diazepam, fentaNYL (SUBLIMAZE) injection, ondansetron (ZOFRAN) IV, sodium chloride flush, sodium chloride flush    Patient Profile   [name] a 85 y.o.femalewith known CAD s/p NSTEMI  in 2019 with cardiac cath showing critical stenosis of the distal LMextending into the proximal LAD and circumflexand chronic TOCof the RCAw/ collaterals, turned down for CABG due to advanced aged and poor functional capacity and later underwentatherectomy with Impella hemodynamic support andtheleft main bifurcation w/stenting in November 2019, presenting back with unstable angina and EKG c/w acute STEMI.   Assessment/Plan   1. CAD: STEMI with 95+% left main, now s/p PCI to left main with Impella support.  She has known chronic occlusion of RCA.  Currently, no chest pain.  Troponin peaked 60.  - Continue ASA 81 and ticagrelor.  - Continue atorvastatin 80 mg daily.  2. Cardiogenic shock: In setting of acute MI.  Echo this admission showed EF down from normal range on most recent echo to 20%.  Ischemic cardiomyopathy.  Hemolysis developed with Impella yesterday, speed decreased to P7.  Urine  not as dark this morning but co-ox 44%. No Impella alarms. When device position checked overnight, position looked good. CVP 4 today.  Denies dyspnea.  Pulses in feet are dopplerable. - Will give gentle IV fluid, would like to keep CVP around 9-10 with Impella.  - Low co-ox but would not increase Impella speed with hemolysis.  Will add milrinone 0.25 and repeat co-ox later this morning.   - Continue heparin gtt with Impella in place.  3. Aortic stenosis: Moderate on echo in 10/19.  Unable to assess on echo given presence of Impella catheter.   4. Type II DM: SSI 5. Hemolysis: Impella-associated.  LDH high. Speed decreased, urine more clear.  Continue to follow.  Hgb down to 10.8 and platelets steady.  6. AKI; Creatinine up to 1.91.  As above, giving IV fluid and trying to limit hemolysis.   CRITICAL CARE Performed by: Loralie Champagne  Total critical care time: 40 minutes  Critical care time was exclusive of separately billable procedures and treating other patients.  Critical care was necessary to  treat or prevent imminent or life-threatening deterioration.  Critical care was time spent personally by me on the following activities: development of treatment plan with patient and/or surrogate as well as nursing, discussions with consultants, evaluation of patient's response to treatment, examination of patient, obtaining history from patient or surrogate, ordering and performing treatments and interventions, ordering and review of laboratory studies, ordering and review of radiographic studies, pulse oximetry and re-evaluation of patient's condition.   Length of Stay: 1  Loralie Champagne, MD  05/20/2018, 7:01 AM  Advanced Heart Failure Team Pager [phone] (M-F; 7a - 4p)  Please contact [facility] for night-coverage after hours (4p -7a ) and weekends on [email or web address]

## 2018-05-20 NOTE — Significant Event (Signed)
Received page from nursing at approximately 6pm that patient had oozing from impella sheath site in left groin. Also with red colored urine which was noted to be new.   On my evaluation, patient awake and appeared somewhat uncomfortable. Pressure had been held at right groin and area redressed without active bleeding. Urine discoloration concerning for hemolysis in the setting of impella. Patient with BP in the 250N systolic off of norepinephrine and on P-9 impella support. LDH elevated at 1074. STAT TTE showed impella approximately 3.8 cm into the ventricle, indicating good placement. Turned impella down from P-9 to P-7 to allow for greater ventricular filling. Co-ox at the time of change 42.0 and repeat 1 hour later stable at 43.6. Remained hemodynamically stable with this adjustment.   Overnight, urine output remained poor. CVP measured using PICC line and found to be 4-6. Given this, did not give additional doses of lasix overnight. This morning, the patient continues to be hemodynamically stable on P-7 support. LDH remains elevated at 1133 and Hgb has dropped to 10.8 (previously 11.6, 12.3). She remains critically ill and requiring significant mechanical support with impella in the setting of cardiogenic shock following STEMI. Troponin continues to elevate, although not unexpected in the initial hours after admission with acute MI.   Bryna Colander, MD 05/20/2018

## 2018-05-20 NOTE — Plan of Care (Signed)
  Problem: Education: Goal: Understanding of cardiac disease, CV risk reduction, and recovery process will improve Outcome: Progressing Goal: Understanding of medication regimen will improve Outcome: Progressing   Problem: Cardiac: Goal: Ability to achieve and maintain adequate cardiopulmonary perfusion will improve Outcome: Progressing Goal: Vascular access site(s) Level 0-1 will be maintained Outcome: Progressing   Problem: Education: Goal: Knowledge of General Education information will improve Description Including pain rating scale, medication(s)/side effects and non-pharmacologic comfort measures Outcome: Progressing   Problem: Clinical Measurements: Goal: Ability to maintain clinical measurements within normal limits will improve Outcome: Progressing Goal: Will remain free from infection Outcome: Progressing Goal: Diagnostic test results will improve Outcome: Progressing Goal: Respiratory complications will improve Outcome: Progressing Goal: Cardiovascular complication will be avoided Outcome: Progressing   Problem: Activity: Goal: Risk for activity intolerance will decrease Outcome: Progressing   Problem: Nutrition: Goal: Adequate nutrition will be maintained Outcome: Progressing   Problem: Coping: Goal: Level of anxiety will decrease Outcome: Progressing   Problem: Pain Managment: Goal: General experience of comfort will improve Outcome: Progressing   Problem: Safety: Goal: Ability to remain free from injury will improve Outcome: Progressing   Problem: Skin Integrity: Goal: Risk for impaired skin integrity will decrease Outcome: Progressing

## 2018-05-20 NOTE — Progress Notes (Signed)
Arterial and venous sheath removed from Right groin . Pressure held for 20 minutes starting at 1748, Site prior to pull at level 0 and level 0 after pull with no palpable hematoma or visble bruising. Pt's VSS. Education given to patient.

## 2018-05-20 NOTE — Progress Notes (Signed)
  Amiodarone Drug - Drug Interaction Consult Note  Recommendations: None, monitor for now.  Amiodarone is metabolized by the cytochrome P450 system and therefore has the potential to cause many drug interactions. Amiodarone has an average plasma half-life of 50 days (range 20 to 100 days).   There is potential for drug interactions to occur several weeks or months after stopping treatment and the onset of drug interactions may be slow after initiating amiodarone.   [x]  Statins: Increased risk of myopathy. Simvastatin- restrict dose to 20mg  daily. Other statins: counsel patients to report any muscle pain or weakness immediately.  []  Anticoagulants: Amiodarone can increase anticoagulant effect. Consider warfarin dose reduction. Patients should be monitored closely and the dose of anticoagulant altered accordingly, remembering that amiodarone levels take several weeks to stabilize.  []  Antiepileptics: Amiodarone can increase plasma concentration of phenytoin, the dose should be reduced. Note that small changes in phenytoin dose can result in large changes in levels. Monitor patient and counsel on signs of toxicity.  []  Beta blockers: increased risk of bradycardia, AV block and myocardial depression. Sotalol - avoid concomitant use.  []   Calcium channel blockers (diltiazem and verapamil): increased risk of bradycardia, AV block and myocardial depression.  []   Cyclosporine: Amiodarone increases levels of cyclosporine. Reduced dose of cyclosporine is recommended.  []  Digoxin dose should be halved when amiodarone is started.  []  Diuretics: increased risk of cardiotoxicity if hypokalemia occurs.  []  Oral hypoglycemic agents (glyburide, glipizide, glimepiride): increased risk of hypoglycemia. Patient's glucose levels should be monitored closely when initiating amiodarone therapy.   []  Drugs that prolong the QT interval:  Torsades de pointes risk may be increased with concurrent use - avoid if  possible.  Monitor QTc, also keep magnesium/potassium WNL if concurrent therapy can't be avoided. Marland Kitchen Antibiotics: e.g. fluoroquinolones, erythromycin. . Antiarrhythmics: e.g. quinidine, procainamide, disopyramide, sotalol. . Antipsychotics: e.g. phenothiazines, haloperidol.  . Lithium, tricyclic antidepressants, and methadone. Thank You,  [name]  05/20/2018 11:11 AM

## 2018-05-21 ENCOUNTER — Inpatient Hospital Stay (HOSPITAL_COMMUNITY): Payer: PPO

## 2018-05-21 ENCOUNTER — Encounter (HOSPITAL_COMMUNITY): Payer: Self-pay | Admitting: Cardiovascular Disease

## 2018-05-21 DIAGNOSIS — I351 Nonrheumatic aortic (valve) insufficiency: Secondary | ICD-10-CM

## 2018-05-21 LAB — COOXEMETRY PANEL
Carboxyhemoglobin: 1.4 % (ref 0.5–1.5)
Carboxyhemoglobin: 1.3 % (ref 0.5–1.5)
Carboxyhemoglobin: 1.6 % — ABNORMAL HIGH (ref 0.5–1.5)
Carboxyhemoglobin: 1.6 % — ABNORMAL HIGH (ref 0.5–1.5)
METHEMOGLOBIN: 1.1 % (ref 0.0–1.5)
Methemoglobin: 1.8 % — ABNORMAL HIGH (ref 0.0–1.5)
Methemoglobin: 1.6 % — ABNORMAL HIGH (ref 0.0–1.5)
Methemoglobin: 1.1 % (ref 0.0–1.5)
O2 SAT: 65.1 %
O2 Saturation: 70 %
O2 Saturation: 56.8 %
O2 Saturation: 62.5 %
TOTAL HEMOGLOBIN: 7.8 g/dL — AB (ref 12.0–16.0)
Total hemoglobin: 9.2 g/dL — ABNORMAL LOW (ref 12.0–16.0)
Total hemoglobin: 8.5 g/dL — ABNORMAL LOW (ref 12.0–16.0)
Total hemoglobin: 9.1 g/dL — ABNORMAL LOW (ref 12.0–16.0)

## 2018-05-21 LAB — RESPIRATORY PANEL BY PCR

## 2018-05-21 LAB — CBC
HEMATOCRIT: 27 % — AB (ref 36.0–46.0)
Hemoglobin: 8.5 g/dL — ABNORMAL LOW (ref 12.0–15.0)
MCH: 26.2 pg (ref 26.0–34.0)
MCHC: 31.5 g/dL (ref 30.0–36.0)
MCV: 83.1 fL (ref 80.0–100.0)
Platelets: 165 10*3/uL (ref 150–400)
RBC: 3.25 MIL/uL — ABNORMAL LOW (ref 3.87–5.11)
RDW: 15.1 % (ref 11.5–15.5)
WBC: 19.8 10*3/uL — AB (ref 4.0–10.5)
nRBC: 0 % (ref 0.0–0.2)

## 2018-05-21 LAB — GLUCOSE, CAPILLARY
GLUCOSE-CAPILLARY: 254 mg/dL — AB (ref 70–99)
GLUCOSE-CAPILLARY: 160 mg/dL — AB (ref 70–99)
GLUCOSE-CAPILLARY: 196 mg/dL — AB (ref 70–99)
Glucose-Capillary: 231 mg/dL — ABNORMAL HIGH (ref 70–99)
Glucose-Capillary: 167 mg/dL — ABNORMAL HIGH (ref 70–99)

## 2018-05-21 LAB — ECHOCARDIOGRAM LIMITED
Height: 63 in
Weight: 2380.97 oz

## 2018-05-21 LAB — POCT ACTIVATED CLOTTING TIME
ACTIVATED CLOTTING TIME: 169 s
Activated Clotting Time: 175 seconds
Activated Clotting Time: 175 seconds
Activated Clotting Time: 169 seconds
Activated Clotting Time: 169 seconds
Activated Clotting Time: 169 seconds

## 2018-05-21 LAB — BASIC METABOLIC PANEL
Anion gap: 7 (ref 5–15)
BUN: 44 mg/dL — AB (ref 8–23)
CO2: 20 mmol/L — ABNORMAL LOW (ref 22–32)
Calcium: 7.3 mg/dL — ABNORMAL LOW (ref 8.9–10.3)
Chloride: 108 mmol/L (ref 98–111)
Creatinine, Ser: 1.97 mg/dL — ABNORMAL HIGH (ref 0.44–1.00)
GFR calc Af Amer: 26 mL/min — ABNORMAL LOW (ref >60)
GFR calc non Af Amer: 22 mL/min — ABNORMAL LOW (ref >60)
Glucose, Bld: 238 mg/dL — ABNORMAL HIGH (ref 70–99)
Potassium: 3.7 mmol/L (ref 3.5–5.1)
Sodium: 135 mmol/L (ref 135–145)

## 2018-05-21 LAB — LIPID PANEL
Cholesterol: 83 mg/dL (ref 0–200)
HDL: 33 mg/dL — ABNORMAL LOW (ref >40)
LDL Cholesterol: 33 mg/dL (ref 0–99)
Total CHOL/HDL Ratio: 2.5 RATIO
Triglycerides: 85 mg/dL (ref <150)
VLDL: 17 mg/dL (ref 0–40)

## 2018-05-21 LAB — LACTATE DEHYDROGENASE: LDH: 714 U/L — ABNORMAL HIGH (ref 98–192)

## 2018-05-21 MED ORDER — "THROMBI-PAD 3""X3"" EX PADS"
1.0000 | MEDICATED_PAD | Freq: Once | CUTANEOUS | Status: DC
  Filled 2018-05-21: qty 1

## 2018-05-21 MED ORDER — DOCUSATE SODIUM 100 MG PO CAPS
100.0000 mg | ORAL_CAPSULE | Freq: Every day | ORAL | Status: DC
  Administered 2018-05-21 – 2018-05-25 (×5): 100 mg via ORAL
  Filled 2018-05-21 (×5): qty 1

## 2018-05-21 MED ORDER — FUROSEMIDE 10 MG/ML IJ SOLN
80.0000 mg | Freq: Two times a day (BID) | INTRAMUSCULAR | Status: DC
  Administered 2018-05-21: 80 mg via INTRAVENOUS
  Filled 2018-05-21: qty 8

## 2018-05-21 MED ORDER — SODIUM CHLORIDE 0.9 % IV SOLN
1.0000 g | INTRAVENOUS | Status: AC
  Administered 2018-05-21 – 2018-05-25 (×5): 1 g via INTRAVENOUS
  Filled 2018-05-21 (×5): qty 1

## 2018-05-21 MED ORDER — VANCOMYCIN HCL 10 G IV SOLR
1250.0000 mg | Freq: Once | INTRAVENOUS | Status: AC
  Administered 2018-05-21: 1250 mg via INTRAVENOUS
  Filled 2018-05-21: qty 1250

## 2018-05-21 MED ORDER — POTASSIUM CHLORIDE 20 MEQ PO PACK
40.0000 meq | PACK | Freq: Once | ORAL | Status: AC
  Administered 2018-05-21: 40 meq via ORAL
  Filled 2018-05-21: qty 2

## 2018-05-21 MED ORDER — HEPARIN SODIUM (PORCINE) 5000 UNIT/ML IJ SOLN
25000.0000 [IU] | INTRAVENOUS | Status: DC
  Filled 2018-05-21: qty 5

## 2018-05-21 MED ORDER — VANCOMYCIN VARIABLE DOSE PER UNSTABLE RENAL FUNCTION (PHARMACIST DOSING): Status: DC

## 2018-05-21 MED FILL — Heparin Sod (Porcine)-NaCl IV Soln 1000 Unit/500ML-0.9%: INTRAVENOUS | Qty: 1000 | Status: AC

## 2018-05-21 NOTE — Care Management (Signed)
Brilinta benefits check sent and pending.  Shalisa Mcquade RN, BSN, NCM-BC, ACM-RN [phone] 

## 2018-05-21 NOTE — Progress Notes (Signed)
Pharmacy Antibiotic Note  [name] is a 85 y.o. female admitted on 05/19/2018 with pneumonia.  Pharmacy has been consulted for Vancomycin and Cefepime dosing.  Plan: Vancomycin 1250 mg IV x 1 now. Due to patient's AKI, dosing will be done by pharmacy based on vancomycin levels. Cefepime 1 gram IV q24hr Will monitor renal function, C&S and vanc levels.  Height: 5\' 3"  (160 cm) Weight: 148 lb 13 oz (67.5 kg) IBW/kg (Calculated) : 52.4  Temp (24hrs), Avg:99.3 F (37.4 C), Min:97.9 F (36.6 C), Max:101.4 F (38.6 C)  Recent Labs  Lab 05/19/18 0833 05/19/18 1844 05/19/18 2001 05/19/18 2208 05/20/18 0502 05/21/18 0507  WBC 13.8* 20.0*  --   --  21.1* 19.8*  CREATININE 1.14*  --  1.69* 1.67* 1.91* 1.97*    Estimated Creatinine Clearance: 18.9 mL/min (A) (by C-G formula based on SCr of 1.97 mg/dL (H)).    Allergies  Allergen Reactions  . Metformin Other (See Comments)    unspecified  . Penicillins     unspecified Has patient had a PCN reaction causing immediate rash, facial/tongue/throat swelling, SOB or lightheadedness with hypotension: Unknown Has patient had a PCN reaction causing severe rash involving mucus membranes or skin necrosis: Unknown Has patient had a PCN reaction that required hospitalization: No Has patient had a PCN reaction occurring within the last 10 years: No If all of the above answers are "NO", then may proceed with Cephalosporin use.   . Codeine Palpitations    Rapid heart rate  . Fenofibrate Palpitations  . Pioglitazone Rash    Antimicrobials this admission: Vanc 12/10 >>  Cefepime 12/10 >>   Thank you for allowing pharmacy to be a part of this patient's care.  Alanda Slim, PharmD, [facility] Clinical Pharmacist Please see AMION for all Pharmacists' Contact Phone Numbers 05/21/2018, 8:19 AM

## 2018-05-21 NOTE — Progress Notes (Signed)
   Failed impella wean. HR increased and CVP went up to 12 from previous 7. Norepi increased to 9 mcg to maintain Maps.   For now continue impella P4. Restart 80 mg IV lasix twice daily.   Will try to wean impella tomorrow. Discussed with Dr Aundra Dubin.   Tonjua Rossetti NP-C  2:45 PM

## 2018-05-21 NOTE — Progress Notes (Signed)
Inpatient Diabetes Program Recommendations  AACE/ADA: New Consensus Statement on Inpatient Glycemic Control (2015)  Target Ranges:  Prepandial:   less than 140 mg/dL      Peak postprandial:   less than 180 mg/dL (1-2 hours)      Critically ill patients:  140 - 180 mg/dL   Lab Results  Component Value Date   GLUCAP 254 (H) 05/21/2018   HGBA1C 6.1 (A) 01/05/2018    Review of Glycemic Control Results for [name], [name] (MRN [id number]) as of 05/21/2018 10:13  Ref. Range 05/20/2018 11:20 05/20/2018 16:00 05/20/2018 21:08 05/21/2018 06:31 05/21/2018 08:00  Glucose-Capillary Latest Ref Range: 70 - 99 mg/dL 300 (H) 252 (H) 200 (H) 231 (H) 254 (H)   Diabetes history: DM2 Outpatient Diabetes medications: Novolin NPH insulin 15 units hs Current orders for Inpatient glycemic control: Novolog resistant correction tid + hs 0-5 units  Inpatient Diabetes Program Recommendations:   -Decrease Novolog correction to sensitive tid + hs -Levemir 10 units qd  Thank you, Nani Gasser. Merced Brougham, RN, MSN, CDE  Diabetes Coordinator Inpatient Glycemic Control Team Team Pager [phone] (8am-5pm) 05/21/2018 10:16 AM

## 2018-05-21 NOTE — Progress Notes (Signed)
  Echocardiogram 2D Echocardiogram has been performed.  [name] 05/21/2018, 10:30 AM

## 2018-05-21 NOTE — Progress Notes (Signed)
Patient ID: [name], female   DOB: 08-Aug-1929, 85 y.o.   MRN: [id number]     Advanced Heart Failure Rounding Note  PCP-Cardiologist: Sherren Mocha, MD   Subjective:    Currently on milrinone 0.125 mcg/kg/min, amiodarone 30, norepinephrine 4.  She is on Impella decreased to P7.  Urine now clear, LDH decreased 1133 => 714.  Co-ox 70% today with CVP 7-8.  Creatinine fairly stable 1.91 => 1.97.   CXR: Mild diffuse pulmonary edema.   Febrile overnight to 101.4, WBCs 21 => 19.8. Hgb and plts trending down.   She remains in NSR, on heparin gtt.  SBP 100s generally this morning.   Impella: P7, flow 3.4 L/min   Objective:   Weight Range: 67.5 kg Body mass index is 26.36 kg/m.   Vital Signs:   Temp:  [97.9 F (36.6 C)-101.4 F (38.6 C)] 98.8 F (37.1 C) (02/10 0730) Pulse Rate:  [85-107] 93 (02/10 0830) Resp:  [18-43] 26 (02/10 0830) BP: (86-124)/(51-69) 118/61 (02/10 0830) SpO2:  [89 %-98 %] 94 % (02/10 0830) Weight:  [67.5 kg] 67.5 kg (02/10 0500) Last BM Date: 05/17/18  Weight change: Filed Weights   05/19/18 1900 05/20/18 0500 05/21/18 0500  Weight: 68.5 kg 66.2 kg 67.5 kg    Intake/Output:   Intake/Output Summary (Last 24 hours) at 05/21/2018 0857 Last data filed at 05/21/2018 0800 Gross per 24 hour  Intake 3532.09 ml  Output 940 ml  Net 2592.09 ml      Physical Exam    General: NAD Neck: No JVD, no thyromegaly or thyroid nodule.  Lungs: Clear to auscultation bilaterally with normal respiratory effort. CV: Nondisplaced PMI.  Heart regular S1/S2, no S3/S4, no murmur.  Impella sounds heard. No peripheral edema.  Pedal pulses dopplerable and feet more warm.  Abdomen: Soft, nontender, no hepatosplenomegaly, no distention.  Skin: Intact without lesions or rashes.  Neurologic: Alert and oriented x 3.  Psych: Normal affect. Extremities: No clubbing or cyanosis. Impella site stable.  HEENT: Normal.    Telemetry   NSR 80s (personally reviewed)  Labs    CBC Recent Labs    05/19/18 1844 05/20/18 0502 05/21/18 0507  WBC 20.0* 21.1* 19.8*  NEUTROABS 15.6*  --   --   HGB 12.3 10.8* 8.5*  HCT 39.6 34.4* 27.0*  MCV 84.6 82.7 83.1  PLT 268 223 188   Basic Metabolic Panel Recent Labs    05/20/18 0502 05/21/18 0507  NA 139 135  K 4.3 3.7  CL 107 108  CO2 21* 20*  GLUCOSE 250* 238*  BUN 46* 44*  CREATININE 1.91* 1.97*  CALCIUM 8.4* 7.3*   Liver Function Tests Recent Labs    05/19/18 2208 05/20/18 0502  AST 197* 218*  ALT 21 26  ALKPHOS 109 100  BILITOT 2.2* 1.4*  PROT 6.5 6.2*  ALBUMIN 3.1* 2.8*   No results for input(s): LIPASE, AMYLASE in the last 72 hours. Cardiac Enzymes Recent Labs    05/19/18 2001 05/19/18 2208  TROPONINI 52.81* 59.66*    BNP: BNP (last 3 results) Recent Labs    05/19/18 1850  BNP 870.3*    ProBNP (last 3 results) Recent Labs    01/17/18 1122  PROBNP 1,194*     D-Dimer No results for input(s): DDIMER in the last 72 hours. Hemoglobin A1C No results for input(s): HGBA1C in the last 72 hours. Fasting Lipid Panel Recent Labs    05/21/18 0507  CHOL 83  HDL 33*  LDLCALC 33  TRIG 85  CHOLHDL 2.5   Thyroid Function Tests No results for input(s): TSH, T4TOTAL, T3FREE, THYROIDAB in the last 72 hours.  Invalid input(s): FREET3  Other results:   Imaging    Dg Chest Port 1 View  Result Date: 05/21/2018 CLINICAL DATA:  Pneumonia EXAM: PORTABLE CHEST 1 VIEW COMPARISON:  05/19/2018 FINDINGS: Interval placement of a right upper extremity PICC, tip positioned near the superior cavoatrial junction. Impella device remains in unchanged position. Unchanged cardiomegaly with mild diffuse interstitial pulmonary opacity and probable small pleural effusions. There is no new or focal airspace opacity. IMPRESSION: Interval placement of a right upper extremity PICC, tip positioned near the superior cavoatrial junction. Impella device remains in unchanged position. Unchanged cardiomegaly  with mild diffuse interstitial pulmonary opacity and probable small pleural effusions. There is no new or focal airspace opacity. Electronically Signed   By: Eddie Candle M.D.   On: 05/21/2018 08:15     Medications:     Scheduled Medications: . aspirin  81 mg Oral Daily  . atorvastatin  80 mg Oral q1800  . Chlorhexidine Gluconate Cloth  6 each Topical Daily  . docusate sodium  100 mg Oral Daily  . insulin aspart  0-20 Units Subcutaneous TID WC  . insulin aspart  0-5 Units Subcutaneous QHS  . mouth rinse  15 mL Mouth Rinse BID  . pantoprazole  40 mg Oral BID  . sodium chloride flush  10-40 mL Intracatheter Q12H  . sodium chloride flush  3 mL Intravenous Q12H  . ticagrelor  90 mg Oral BID  . vancomycin variable dose per unstable renal function (pharmacist dosing)   Does not apply See admin instructions    Infusions: . sodium chloride Stopped (05/20/18 0700)  . sodium chloride 10 mL/hr at 05/21/18 0800  . amiodarone 30 mg/hr (05/21/18 0832)  . ceFEPime (MAXIPIME) IV    . impella catheter heparin 25 unit/mL in dextrose 5%    . heparin 1,100 Units/hr (05/21/18 0800)  . milrinone 0.125 mcg/kg/min (05/21/18 0800)  . nitroGLYCERIN Stopped (05/19/18 1034)  . norepinephrine (LEVOPHED) Adult infusion 3 mcg/min (05/21/18 0800)  . vancomycin      PRN Medications: sodium chloride, acetaminophen, amiodarone, diazepam, fentaNYL (SUBLIMAZE) injection, ondansetron (ZOFRAN) IV, sodium chloride flush, sodium chloride flush    Patient Profile   [name] Doane Gilbertis a 85 y.o.femalewith known CAD s/p NSTEMI in 2019 with cardiac cath showing critical stenosis of the distal LMextending into the proximal LAD and circumflexand chronic TOCof the RCAw/ collaterals, turned down for CABG due to advanced aged and poor functional capacity and later underwentatherectomy with Impella hemodynamic support andtheleft main bifurcation w/stenting in November 2019, presenting back with unstable angina and  EKG c/w acute STEMI.   Assessment/Plan   1. CAD: STEMI with 95+% left main, now s/p PCI to left main with Impella support.  She has known chronic occlusion of RCA.  Currently, no chest pain.  Troponin peaked 60.  - Continue ASA 81 and ticagrelor.  - Continue atorvastatin 80 mg daily.  2. Cardiogenic shock: In setting of acute MI.  Echo this admission showed EF down from normal range on most recent echo to 20%.  Ischemic cardiomyopathy.  Hemolysis developed with Impella, speed decreased to P7 and urine clearing with LDH coming down. No Impella alarms. CVP 7-8 today, got IV fluid yesterday.   Denies dyspnea.  Pulses in feet are dopplerable. She remains on milrinone 0.125 with norepinephrine 4, co-ox 70%.  - Wean down on norepinephrine, BP stable.  -  Continue milrinone 0.125.  - Wean down Impella support, turn down to P6 this morning.  Will continue wean down, if BP and co-ox stable, will remove.  - Continue heparin gtt with Impella in place.  3. Aortic stenosis: Moderate on echo in 10/19.  Unable to assess on echo given presence of Impella catheter.   4. Type II DM: SSI 5. Hemolysis: Impella-associated.  Speed decreased, urine more clear and LDH coming down.  Continue to follow.  Hgb and plts starting to fall.  Hopefully can remove Impella soon.  6. AKI; Creatinine up to 1.97 but fairly stable over the last day. I/Os positive and no nephrotoxins.  7. Anemia: Suspect this is Impella-related with hemolysis noted (though improved), hgb down to 8.5.  Groin site benign. Hopefully can get Impella out soon.  8. Thrombocytopenia: Suspect related to Impella hemolysis.  9. NSVT: On milrinone 0.25.  Resolved with decrease to 0.125 and amiodarone.  10. ID: Febrile to 101.4.  Had sick contacts at home.  WBCs elevated.   - Send blood cultures.  - CXR w/o definite PNA.  - Respiratory virus panel.  - Empiric cefepime/vancomycin.    CRITICAL CARE Performed by: Loralie Champagne  Total critical care time: 40  minutes  Critical care time was exclusive of separately billable procedures and treating other patients.  Critical care was necessary to treat or prevent imminent or life-threatening deterioration.  Critical care was time spent personally by me on the following activities: development of treatment plan with patient and/or surrogate as well as nursing, discussions with consultants, evaluation of patient's response to treatment, examination of patient, obtaining history from patient or surrogate, ordering and performing treatments and interventions, ordering and review of laboratory studies, ordering and review of radiographic studies, pulse oximetry and re-evaluation of patient's condition.   Length of Stay: 2  Loralie Champagne, MD  05/21/2018, 8:57 AM  Advanced Heart Failure Team Pager [phone] (M-F; 7a - 4p)  Please contact [facility] for night-coverage after hours (4p -7a ) and weekends on [email or web address]

## 2018-05-21 NOTE — Care Management (Signed)
#    2.  S/W   [facility]  @ [employer] # [phone] OPT- 2  TICAGRELOR  : NONE FORMULARY  BRILINTA  90 MG BID  COVER- YES CO-PAY- $ 45.00 TIER- 3 DRUG PRIOR APPROVAL- NO  NO DEDUCTIBLE  PREFERRED PHARMACY ; YES [facility]

## 2018-05-22 DIAGNOSIS — Z95811 Presence of heart assist device: Secondary | ICD-10-CM

## 2018-05-22 LAB — BASIC METABOLIC PANEL
Anion gap: 11 (ref 5–15)
BUN: 34 mg/dL — ABNORMAL HIGH (ref 8–23)
CO2: 19 mmol/L — ABNORMAL LOW (ref 22–32)
CREATININE: 1.87 mg/dL — AB (ref 0.44–1.00)
Calcium: 7 mg/dL — ABNORMAL LOW (ref 8.9–10.3)
Chloride: 106 mmol/L (ref 98–111)
GFR calc Af Amer: 28 mL/min — ABNORMAL LOW (ref >60)
GFR calc non Af Amer: 24 mL/min — ABNORMAL LOW (ref >60)
Glucose, Bld: 300 mg/dL — ABNORMAL HIGH (ref 70–99)
Potassium: 3.4 mmol/L — ABNORMAL LOW (ref 3.5–5.1)
Sodium: 136 mmol/L (ref 135–145)

## 2018-05-22 LAB — COOXEMETRY PANEL
Carboxyhemoglobin: 1.5 % (ref 0.5–1.5)
Carboxyhemoglobin: 1.3 % (ref 0.5–1.5)
Methemoglobin: 1.7 % — ABNORMAL HIGH (ref 0.0–1.5)
Methemoglobin: 1.6 % — ABNORMAL HIGH (ref 0.0–1.5)
O2 Saturation: 65.3 %
O2 Saturation: 61.8 %
Total hemoglobin: 8.1 g/dL — ABNORMAL LOW (ref 12.0–16.0)
Total hemoglobin: 8.2 g/dL — ABNORMAL LOW (ref 12.0–16.0)

## 2018-05-22 LAB — POCT ACTIVATED CLOTTING TIME
ACTIVATED CLOTTING TIME: 158 s
Activated Clotting Time: 147 seconds
Activated Clotting Time: 169 seconds
Activated Clotting Time: 169 seconds
Activated Clotting Time: 169 seconds

## 2018-05-22 LAB — CBC
HCT: 24.3 % — ABNORMAL LOW (ref 36.0–46.0)
HCT: 29.4 % — ABNORMAL LOW (ref 36.0–46.0)
Hemoglobin: 7.9 g/dL — ABNORMAL LOW (ref 12.0–15.0)
Hemoglobin: 9.5 g/dL — ABNORMAL LOW (ref 12.0–15.0)
MCH: 27.2 pg (ref 26.0–34.0)
MCH: 26.5 pg (ref 26.0–34.0)
MCHC: 32.5 g/dL (ref 30.0–36.0)
MCHC: 32.3 g/dL (ref 30.0–36.0)
MCV: 83.8 fL (ref 80.0–100.0)
MCV: 81.9 fL (ref 80.0–100.0)
PLATELETS: 145 10*3/uL — AB (ref 150–400)
Platelets: 141 10*3/uL — ABNORMAL LOW (ref 150–400)
RBC: 2.9 MIL/uL — ABNORMAL LOW (ref 3.87–5.11)
RBC: 3.59 MIL/uL — ABNORMAL LOW (ref 3.87–5.11)
RDW: 15.4 % (ref 11.5–15.5)
RDW: 15.6 % — ABNORMAL HIGH (ref 11.5–15.5)
WBC: 22.4 10*3/uL — ABNORMAL HIGH (ref 4.0–10.5)
WBC: 19.3 10*3/uL — ABNORMAL HIGH (ref 4.0–10.5)
nRBC: 0 % (ref 0.0–0.2)
nRBC: 0.1 % (ref 0.0–0.2)

## 2018-05-22 LAB — PREPARE RBC (CROSSMATCH)

## 2018-05-22 LAB — GLUCOSE, CAPILLARY
GLUCOSE-CAPILLARY: 196 mg/dL — AB (ref 70–99)
Glucose-Capillary: 239 mg/dL — ABNORMAL HIGH (ref 70–99)
Glucose-Capillary: 153 mg/dL — ABNORMAL HIGH (ref 70–99)
Glucose-Capillary: 161 mg/dL — ABNORMAL HIGH (ref 70–99)

## 2018-05-22 LAB — LACTATE DEHYDROGENASE: LDH: 517 U/L — ABNORMAL HIGH (ref 98–192)

## 2018-05-22 LAB — VANCOMYCIN, RANDOM: Vancomycin Rm: 12

## 2018-05-22 MED ORDER — INSULIN ASPART 100 UNIT/ML ~~LOC~~ SOLN: 0.0000 [IU] | Freq: Three times a day (TID) | SUBCUTANEOUS | Status: DC

## 2018-05-22 MED ORDER — INSULIN ASPART 100 UNIT/ML ~~LOC~~ SOLN: 0.0000 [IU] | Freq: Every day | SUBCUTANEOUS | Status: DC

## 2018-05-22 MED ORDER — POTASSIUM CHLORIDE CRYS ER 20 MEQ PO TBCR
30.0000 meq | EXTENDED_RELEASE_TABLET | Freq: Once | ORAL | Status: AC
  Administered 2018-05-22: 30 meq via ORAL
  Filled 2018-05-22: qty 1

## 2018-05-22 MED ORDER — VANCOMYCIN HCL IN DEXTROSE 750-5 MG/150ML-% IV SOLN
750.0000 mg | Freq: Once | INTRAVENOUS | Status: AC
  Administered 2018-05-22: 750 mg via INTRAVENOUS
  Filled 2018-05-22: qty 150

## 2018-05-22 MED ORDER — POTASSIUM CHLORIDE 10 MEQ/100ML IV SOLN
INTRAVENOUS | Status: AC
  Filled 2018-05-22: qty 400

## 2018-05-22 MED ORDER — SODIUM CHLORIDE 0.9% IV SOLUTION
Freq: Once | INTRAVENOUS | Status: AC
  Administered 2018-05-22: 10:00:00 via INTRAVENOUS

## 2018-05-22 MED ORDER — FUROSEMIDE 10 MG/ML IJ SOLN
40.0000 mg | Freq: Two times a day (BID) | INTRAMUSCULAR | Status: AC
  Administered 2018-05-22: 40 mg via INTRAVENOUS
  Filled 2018-05-22: qty 4

## 2018-05-22 MED ORDER — POTASSIUM CHLORIDE 10 MEQ/50ML IV SOLN
10.0000 meq | INTRAVENOUS | Status: AC
  Administered 2018-05-22 (×4): 10 meq via INTRAVENOUS
  Filled 2018-05-22 (×4): qty 50

## 2018-05-22 MED ORDER — LIP MEDEX EX OINT
TOPICAL_OINTMENT | CUTANEOUS | Status: DC | PRN
  Filled 2018-05-22: qty 7

## 2018-05-22 MED ORDER — FUROSEMIDE 10 MG/ML IJ SOLN
INTRAMUSCULAR | Status: AC
  Filled 2018-05-22: qty 4

## 2018-05-22 MED ORDER — INSULIN ASPART 100 UNIT/ML ~~LOC~~ SOLN
0.0000 [IU] | Freq: Three times a day (TID) | SUBCUTANEOUS | Status: DC
  Administered 2018-05-22 – 2018-05-23 (×4): 3 [IU] via SUBCUTANEOUS
  Administered 2018-05-23: 2 [IU] via SUBCUTANEOUS
  Administered 2018-05-24: 3 [IU] via SUBCUTANEOUS
  Administered 2018-05-24 (×2): 5 [IU] via SUBCUTANEOUS
  Administered 2018-05-25: 3 [IU] via SUBCUTANEOUS
  Administered 2018-05-25: 5 [IU] via SUBCUTANEOUS

## 2018-05-22 MED ORDER — INSULIN DETEMIR 100 UNIT/ML ~~LOC~~ SOLN
10.0000 [IU] | Freq: Every day | SUBCUTANEOUS | Status: DC
  Administered 2018-05-22 – 2018-05-24 (×3): 10 [IU] via SUBCUTANEOUS
  Filled 2018-05-22 (×4): qty 0.1

## 2018-05-22 NOTE — Progress Notes (Signed)
BP 96/55 (68) with levo at 9 mcg/min- increased from 2 at beginning of shift. Bed rest completed at 1850 s/p Impella removal L groin. Site remains level 0 with no obvious signs of bleeding. Pt received 1uPRBC earlier for hgb 7.9, no repeat CBC completed. Called Dr. Jeannette Corpus, verbal order given for CBC. Holding PM medications until labs are resulted.

## 2018-05-22 NOTE — Progress Notes (Signed)
Pharmacy Antibiotic Note  [name] is a 85 y.o. female admitted on 05/19/2018 with pneumonia.  Pharmacy has been consulted for Vancomycin and Cefepime dosing.  Vancomycin random ~22 hours after last dose came back slightly subtherapeutic at 12. WBC increased to 22.4. Scr 1.87 (CrCl 19.6 mL/min). Made good urine output at 2775 mL/24 hours. Impella removed this afternoon.   Plan: Vancomycin 750 mg IV once today Cefepime 1 gram IV q24hr Will monitor renal function, C&S and vanc levels.  Height: 5\' 3"  (160 cm) Weight: 144 lb 6.4 oz (65.5 kg) IBW/kg (Calculated) : 52.4  Temp (24hrs), Avg:98.9 F (37.2 C), Min:97.9 F (36.6 C), Max:99.9 F (37.7 C)  Recent Labs  Lab 05/19/18 0833 05/19/18 1844 05/19/18 2001 05/19/18 2208 05/20/18 0502 05/21/18 0507 05/22/18 0410 05/22/18 0731  WBC 13.8* 20.0*  --   --  21.1* 19.8* 22.4*  --   CREATININE 1.14*  --  1.69* 1.67* 1.91* 1.97* 1.87*  --   VANCORANDOM  --   --   --   --   --   --   --  12    Estimated Creatinine Clearance: 19.6 mL/min (A) (by C-G formula based on SCr of 1.87 mg/dL (H)).    Allergies  Allergen Reactions  . Metformin Other (See Comments)    unspecified  . Penicillins     unspecified Has patient had a PCN reaction causing immediate rash, facial/tongue/throat swelling, SOB or lightheadedness with hypotension: Unknown Has patient had a PCN reaction causing severe rash involving mucus membranes or skin necrosis: Unknown Has patient had a PCN reaction that required hospitalization: No Has patient had a PCN reaction occurring within the last 10 years: No If all of the above answers are "NO", then may proceed with Cephalosporin use.   . Codeine Palpitations    Rapid heart rate  . Fenofibrate Palpitations  . Pioglitazone Rash    Antimicrobials this admission: Vanc 12/10 >>  Cefepime 12/10 >>   Dose adjustments this admission:  2/11 VR 12 - order one time dose of 750 mg IV  Microbiology results:  2/10 BCx:  NGTD 2/10 Resp PCR: neg  2/8 MRSA PCR: neg  Thank you for allowing pharmacy to be a part of this patient's care.  Antonietta Jewel, PharmD, [place] Clinical Pharmacist  Pager: [phone] Phone: [phone] Please see AMION for all Pharmacists' Contact Phone Numbers 05/22/2018, 2:03 PM

## 2018-05-22 NOTE — Progress Notes (Signed)
Inpatient Diabetes Program Recommendations  AACE/ADA: New Consensus Statement on Inpatient Glycemic Control (2015)  Target Ranges:  Prepandial:   less than 140 mg/dL      Peak postprandial:   less than 180 mg/dL (1-2 hours)      Critically ill patients:  140 - 180 mg/dL   Results for [name], [name] (MRN [id number]) as of 05/22/2018 10:43  Ref. Range 05/21/2018 08:00 05/21/2018 11:31 05/21/2018 16:06 05/21/2018 20:32 05/22/2018 06:37  Glucose-Capillary Latest Ref Range: 70 - 99 mg/dL 254 (H) 160 (H) 196 (H) 167 (H) 239 (H)   Diabetes history: DM2 Outpatient Diabetes medications: Novolin NPH insulin 15 units hs Current orders for Inpatient glycemic control: Novolog 0-20 units tid + hs 0-5 units  Inpatient Diabetes Program Recommendations:   -Decrease Novolog correction to 0-9 units tid + hs scale -Levemir 10 units qd  Thank you, Tama Headings RN, MSN, BC-ADM Inpatient Diabetes Coordinator Team Pager [phone] (8a-5p)

## 2018-05-22 NOTE — Progress Notes (Signed)
Called Dr. [place] regarding AM K 3.4, receiving scheduled IV lasix. Cr 1.87, CVP 9, on Impella support. Verbal orders given for 40 mEq Potassium replacement IV. Orders initiated.

## 2018-05-22 NOTE — Care Management Note (Addendum)
Case Management Note  Patient Details  Name: [name] MRN: [id number] Date of Birth: 04-24-1929  Subjective/Objective:  85 yo female presented with unstable angina with acute STEMI; s/p cath with PCI.               Action/Plan: CM following for dispositional needs. Brilinta benefits check sent and complete with est monthly copay cost $45.00. Patient with recent impella removal, with CM to continue following to discuss Brilinta and assist with transitional needs.   Expected Discharge Date:                  Expected Discharge Plan:  [place]  In-House Referral:  Clinical Social Work  Discharge planning Services  CM Consult, Medication Assistance(Brilinta benefits check)  Post Acute Care Choice:  NA Choice offered to:  NA  DME Arranged:  N/A DME Agency:  NA  HH Arranged:  NA HH Agency:  NA  Status of Service:  In process, will continue to follow  If discussed at Long Length of Stay Meetings, dates discussed:  05/24/18  Additional Comments: 05/24/18 @ 1105-Karan Ramnauth RNCM-PT/OT eval complete with [facility] SNF recommended; CSW consulted to discuss/arrange placement d/t patient not having 24/7 assist. Per nursing patients family is unable to provide. CM team will continue to follow.   Midge Minium RN, BSN, NCM-BC, ACM-RN [phone] 05/22/2018, 2:07 PM

## 2018-05-22 NOTE — Progress Notes (Addendum)
Patient ID: [name], female   DOB: [birth year], 85 y.o.   MRN: [id number]     Advanced Heart Failure Rounding Note  PCP-Cardiologist: Sherren Mocha, MD   Subjective:    Currently on milrinone 0.125 mcg/kg/min, amiodarone 30, norepinephrine 11 mcg.   Unable to wean impella yesterday. Remains on P4. CO-OX 65%.   Anxious about getting the impella out. Denies chest pain.   Impella P4  Flow 2.3   Objective:   Weight Range: 65.5 kg Body mass index is 25.58 kg/m.   Vital Signs:   Temp:  [97.9 F (36.6 C)-99.9 F (37.7 C)] 99.6 F (37.6 C) (02/11 0400) Pulse Rate:  [83-107] 88 (02/11 0700) Resp:  [18-35] 26 (02/11 0700) BP: (85-121)/(45-81) 112/59 (02/11 0700) SpO2:  [90 %-98 %] 96 % (02/11 0700) Weight:  [65.5 kg] 65.5 kg (02/11 0455) Last BM Date: 05/17/18  Weight change: Filed Weights   05/20/18 0500 05/21/18 0500 05/22/18 0455  Weight: 66.2 kg 67.5 kg 65.5 kg    Intake/Output:   Intake/Output Summary (Last 24 hours) at 05/22/2018 0716 Last data filed at 05/22/2018 0700 Gross per 24 hour  Intake 2553.84 ml  Output 2775 ml  Net -221.16 ml      Physical Exam   CVP 8 General:  No resp difficulty.  HEENT: normal Neck: supple. JVP 7-8. Carotids 2+ bilat; no bruits. No lymphadenopathy or thryomegaly appreciated. Cor: PMI nondisplaced. Regular rate & rhythm. No rubs, gallops or murmurs. Impella left groin Lungs: clear on 2 liters  Abdomen: soft, nontender, nondistended. No hepatosplenomegaly. No bruits or masses. Good bowel sounds. Extremities: no cyanosis, clubbing, rash, edema. RUE PICC. RLE LLE scds.  Neuro: alert & orientedx3, cranial nerves grossly intact. moves all 4 extremities w/o difficulty. Affect pleasant   Telemetry   NSR 90s  Labs    CBC Recent Labs    05/19/18 1844  05/21/18 0507 05/22/18 0410  WBC 20.0*   < > 19.8* 22.4*  NEUTROABS 15.6*  --   --   --   HGB 12.3   < > 8.5* 7.9*  HCT 39.6   < > 27.0* 24.3*  MCV 84.6   < > 83.1 83.8    PLT 268   < > 165 141*   < > = values in this interval not displayed.   Basic Metabolic Panel Recent Labs    05/21/18 0507 05/22/18 0410  NA 135 136  K 3.7 3.4*  CL 108 106  CO2 20* 19*  GLUCOSE 238* 300*  BUN 44* 34*  CREATININE 1.97* 1.87*  CALCIUM 7.3* 7.0*   Liver Function Tests Recent Labs    05/19/18 2208 05/20/18 0502  AST 197* 218*  ALT 21 26  ALKPHOS 109 100  BILITOT 2.2* 1.4*  PROT 6.5 6.2*  ALBUMIN 3.1* 2.8*   No results for input(s): LIPASE, AMYLASE in the last 72 hours. Cardiac Enzymes Recent Labs    05/19/18 2001 05/19/18 2208  TROPONINI 52.81* 59.66*    BNP: BNP (last 3 results) Recent Labs    05/19/18 1850  BNP 870.3*    ProBNP (last 3 results) Recent Labs    01/17/18 1122  PROBNP 1,194*     D-Dimer No results for input(s): DDIMER in the last 72 hours. Hemoglobin A1C No results for input(s): HGBA1C in the last 72 hours. Fasting Lipid Panel Recent Labs    05/21/18 0507  CHOL 83  HDL 33*  LDLCALC 33  TRIG 85  CHOLHDL 2.5   Thyroid  Function Tests No results for input(s): TSH, T4TOTAL, T3FREE, THYROIDAB in the last 72 hours.  Invalid input(s): FREET3  Other results:   Imaging    Dg Chest Port 1 View  Result Date: 05/21/2018 CLINICAL DATA:  Pneumonia EXAM: PORTABLE CHEST 1 VIEW COMPARISON:  05/19/2018 FINDINGS: Interval placement of a right upper extremity PICC, tip positioned near the superior cavoatrial junction. Impella device remains in unchanged position. Unchanged cardiomegaly with mild diffuse interstitial pulmonary opacity and probable small pleural effusions. There is no new or focal airspace opacity. IMPRESSION: Interval placement of a right upper extremity PICC, tip positioned near the superior cavoatrial junction. Impella device remains in unchanged position. Unchanged cardiomegaly with mild diffuse interstitial pulmonary opacity and probable small pleural effusions. There is no new or focal airspace opacity.  Electronically Signed   By: [name] M.D.   On: 05/21/2018 08:15     Medications:     Scheduled Medications: . aspirin  81 mg Oral Daily  . atorvastatin  80 mg Oral q1800  . Chlorhexidine Gluconate Cloth  6 each Topical Daily  . docusate sodium  100 mg Oral Daily  . furosemide  80 mg Intravenous BID  . insulin aspart  0-20 Units Subcutaneous TID WC  . insulin aspart  0-5 Units Subcutaneous QHS  . mouth rinse  15 mL Mouth Rinse BID  . pantoprazole  40 mg Oral BID  . sodium chloride flush  10-40 mL Intracatheter Q12H  . sodium chloride flush  3 mL Intravenous Q12H  . THROMBI-PAD  1 each Topical Once  . ticagrelor  90 mg Oral BID  . vancomycin variable dose per unstable renal function (pharmacist dosing)   Does not apply See admin instructions    Infusions: . sodium chloride Stopped (05/20/18 0700)  . sodium chloride 0 mL/hr at 05/22/18 0400  . amiodarone 30 mg/hr (05/22/18 0700)  . ceFEPime (MAXIPIME) IV Stopped (05/21/18 0936)  . impella catheter heparin 25 unit/mL in dextrose 5%    . heparin 1,100 Units/hr (05/22/18 0700)  . milrinone 0.125 mcg/kg/min (05/22/18 0700)  . nitroGLYCERIN Stopped (05/19/18 1034)  . norepinephrine (LEVOPHED) Adult infusion 11 mcg/min (05/22/18 0700)  . potassium chloride 50 mL/hr at 05/22/18 0700    PRN Medications: sodium chloride, acetaminophen, amiodarone, diazepam, fentaNYL (SUBLIMAZE) injection, lip balm, ondansetron (ZOFRAN) IV, sodium chloride flush, sodium chloride flush    Patient Profile   [name] Littrell Gilbertis a 85 y.o.femalewith known CAD s/p NSTEMI in 2019 with cardiac cath showing critical stenosis of the distal LMextending into the proximal LAD and circumflexand chronic TOCof the RCAw/ collaterals, turned down for CABG due to advanced aged and poor functional capacity and later underwentatherectomy with Impella hemodynamic support andtheleft main bifurcation w/stenting in November 2019, presenting back with unstable  angina and EKG c/w acute STEMI.   Assessment/Plan   1. CAD: STEMI with 95+% left main, now s/p PCI to left main with Impella support.  She has known chronic occlusion of RCA.  Currently, no chest pain.  Troponin peaked 60.  - Continue ASA 81 and ticagrelor.  - Continue atorvastatin 80 mg daily.  2. Cardiogenic shock: In setting of acute MI.  Echo this admission showed EF down from normal range on most recent echo to 20%.  Ischemic cardiomyopathy.  Hemolysis developed with Impella, speed decreased to P7 and urine clearing with LDH coming down. No Impella alarms. Impella currently at P4 CO-OX 65%,  CVP 8-9. Cut back lasix to 40 mg IV at 1000  -She  remains on milrinone 0.125 with norepinephrine 11 mcg,  co-ox 65.  - Continue milrinone 0.125.  - Wean down Impella support, turn down to P3 this morning.  Will continue wean down, if BP and co-ox stable, will remove. Check CO-OX in 1 hour.  - Continue heparin gtt with Impella in place.  3. Aortic stenosis: Moderate on echo in 10/19.  Unable to assess on echo given presence of Impella catheter.   4. Type II DM: SSI 5. Hemolysis: Impella-associated.  Speed decreased, urine more clear and LDH coming down.  Continue to follow.  Hgb and plts starting to fall.  Hopefully can remove Impella soon. Hgb down to 7.9. may need to give 1UPPBC. If hgb down further on CO_OX will need to give a unit.  6. AKI; Creatinine down to 1.8.  7. Anemia: Suspect this is Impella-related with hemolysis noted (though improved), hgb down to 8.5.  Groin site benign. Hopefully can get Impella out soon.  Hgb down to 7.9.  8. Thrombocytopenia: Suspect related to Impella hemolysis.  9. NSVT: On milrinone 0.25.  Resolved with decrease to 0.125 and amiodarone. Continue amio drip for now until we come off pressors.  10. ID: Febrile to 99.9 .  Had sick contacts at home.  WBCs up to 22.   - Blood CX pending. Hope to get impella out and foley out later today. .   - CXR w/o definite PNA.  -  Respiratory virus panel was negative.   - Empiric cefepime/vancomycin.    Length of Stay: 3  Amy Clegg, NP  05/22/2018, 7:16 AM  Advanced Heart Failure Team Pager [phone] (M-F; [place])  Please contact [facility] for night-coverage after hours (4p -7a ) and weekends on [email or web address]  Patient is stable this morning.  Currently on milrinone 0.125 with norepinephrine down to 9.  Impella is now on P3 with 2.3 L/min flow.  Urine clear, LDH down to 517.  Creatinine down to 1.87.  WBCs 22.  Hgb down to 7.9. Good co-ox 65%. Respiratory virus panel negative, afebrile, cultures negative so far.   She is on amiodarone gtt with no arrhythmias overnight.   Repeat echo yesterday showed EF up to 55-60% with normal RV.   On exam, JVP 8 cm.  Impella sounds in chest.  Lungs clear.  No edema.  Feet warm with dopplerable pulses.   In about an hour, will decrease Impella to P2 and send co-ox.  If stable co-ox, will hold heparin gtt and plan to pull Impella this morning.  Continue slowly weaning down NE this morning as well.  BP stable currently. CVP 8 today, will give Lasix 40 mg IV with blood (see below). Replace K.   LDH coming down but hgb lower also likely due to hemolysis.  Urine clear now.   - I will give 1 unit PRBCs this morning with Lasix 40 mg IV.  Continue empiric vancomycin/cefepime today.    CRITICAL CARE Performed by: Loralie Champagne  Total critical care time: 45 minutes  Critical care time was exclusive of separately billable procedures and treating other patients.  Critical care was necessary to treat or prevent imminent or life-threatening deterioration.  Critical care was time spent personally by me on the following activities: development of treatment plan with patient and/or surrogate as well as nursing, discussions with consultants, evaluation of patient's response to treatment, examination of patient, obtaining history from patient or surrogate, ordering and performing treatments  and interventions, ordering and review of laboratory studies,  ordering and review of radiographic studies, pulse oximetry and re-evaluation of patient's condition.   Loralie Champagne 05/22/2018 7:45 AM

## 2018-05-22 NOTE — Progress Notes (Signed)
Paged Cardiology regarding lack of AM CXR. Awaiting response.

## 2018-05-22 NOTE — Progress Notes (Signed)
Impella catheter pulled back from ventricle by Amy Clegg.   Sutures removed, impella device and sheath removed from left femoral artery as a unit. Manual pressure applied for 35 minutes.   Groin level 0 , very slight barely visible bruise at sheath insertion and suture sites. No S+S of hematoma. Tegaderm dressing applied. Bedrest instructions given. Left dp easily detected with doppler.  Bedrest begins at 12:50:00.

## 2018-05-22 NOTE — Progress Notes (Signed)
   Impella weaned. ACT 147.   Impella removed without difficulty followed by sheath pull by Cath Lab with pressure held. No ectopy.    Amy Clegg NP-C  12:20 PM

## 2018-05-23 ENCOUNTER — Inpatient Hospital Stay (HOSPITAL_COMMUNITY): Payer: PPO

## 2018-05-23 DIAGNOSIS — I340 Nonrheumatic mitral (valve) insufficiency: Secondary | ICD-10-CM

## 2018-05-23 LAB — BASIC METABOLIC PANEL
Anion gap: 9 (ref 5–15)
BUN: 30 mg/dL — ABNORMAL HIGH (ref 8–23)
CO2: 21 mmol/L — ABNORMAL LOW (ref 22–32)
CREATININE: 1.6 mg/dL — AB (ref 0.44–1.00)
Calcium: 7.4 mg/dL — ABNORMAL LOW (ref 8.9–10.3)
Chloride: 106 mmol/L (ref 98–111)
GFR calc Af Amer: 33 mL/min — ABNORMAL LOW (ref >60)
GFR calc non Af Amer: 29 mL/min — ABNORMAL LOW (ref >60)
Glucose, Bld: 251 mg/dL — ABNORMAL HIGH (ref 70–99)
Potassium: 3.7 mmol/L (ref 3.5–5.1)
Sodium: 136 mmol/L (ref 135–145)

## 2018-05-23 LAB — CBC
HCT: 30.2 % — ABNORMAL LOW (ref 36.0–46.0)
Hemoglobin: 9.7 g/dL — ABNORMAL LOW (ref 12.0–15.0)
MCH: 26.4 pg (ref 26.0–34.0)
MCHC: 32.1 g/dL (ref 30.0–36.0)
MCV: 82.3 fL (ref 80.0–100.0)
PLATELETS: 145 10*3/uL — AB (ref 150–400)
RBC: 3.67 MIL/uL — ABNORMAL LOW (ref 3.87–5.11)
RDW: 15.9 % — ABNORMAL HIGH (ref 11.5–15.5)
WBC: 16.8 10*3/uL — ABNORMAL HIGH (ref 4.0–10.5)
nRBC: 0 % (ref 0.0–0.2)

## 2018-05-23 LAB — BPAM RBC
Blood Product Expiration Date: 202002182359
ISSUE DATE / TIME: 202002111043
Unit Type and Rh: 9500

## 2018-05-23 LAB — TYPE AND SCREEN
ABO/RH(D): O NEG
Antibody Screen: NEGATIVE
Unit division: 0

## 2018-05-23 LAB — GLUCOSE, CAPILLARY
Glucose-Capillary: 194 mg/dL — ABNORMAL HIGH (ref 70–99)
Glucose-Capillary: 199 mg/dL — ABNORMAL HIGH (ref 70–99)
Glucose-Capillary: 133 mg/dL — ABNORMAL HIGH (ref 70–99)
Glucose-Capillary: 132 mg/dL — ABNORMAL HIGH (ref 70–99)

## 2018-05-23 LAB — ECHOCARDIOGRAM COMPLETE
Height: 63 in
Weight: 2409.19 oz

## 2018-05-23 LAB — COOXEMETRY PANEL
Carboxyhemoglobin: 1.5 % (ref 0.5–1.5)
Methemoglobin: 1 % (ref 0.0–1.5)
O2 Saturation: 62.8 %
Total hemoglobin: 10.2 g/dL — ABNORMAL LOW (ref 12.0–16.0)

## 2018-05-23 LAB — LACTATE DEHYDROGENASE: LDH: 395 U/L — ABNORMAL HIGH (ref 98–192)

## 2018-05-23 MED ORDER — METOLAZONE 2.5 MG PO TABS
2.5000 mg | ORAL_TABLET | Freq: Once | ORAL | Status: AC
  Administered 2018-05-23: 2.5 mg via ORAL
  Filled 2018-05-23: qty 1

## 2018-05-23 MED ORDER — POTASSIUM CHLORIDE CRYS ER 20 MEQ PO TBCR
40.0000 meq | EXTENDED_RELEASE_TABLET | Freq: Once | ORAL | Status: AC
  Administered 2018-05-23: 40 meq via ORAL
  Filled 2018-05-23: qty 2

## 2018-05-23 MED ORDER — HEPARIN SODIUM (PORCINE) 5000 UNIT/ML IJ SOLN
5000.0000 [IU] | Freq: Three times a day (TID) | INTRAMUSCULAR | Status: DC
  Administered 2018-05-23 – 2018-05-25 (×6): 5000 [IU] via SUBCUTANEOUS
  Filled 2018-05-23 (×6): qty 1

## 2018-05-23 MED ORDER — FUROSEMIDE 10 MG/ML IJ SOLN
80.0000 mg | Freq: Two times a day (BID) | INTRAMUSCULAR | Status: DC
  Administered 2018-05-23 (×2): 80 mg via INTRAVENOUS
  Filled 2018-05-23 (×2): qty 8

## 2018-05-23 NOTE — Progress Notes (Signed)
Patient ID: [name], female   DOB: 06-08-1929, 85 y.o.   MRN: [id number]     Advanced Heart Failure Rounding Note  PCP-Cardiologist: Sherren Mocha, MD   Subjective:    Impella removed on 2/11 and milrinone stopped.  Norepinephrine weaning off this morning.   She is short of breath today, CVP 14-15.  Creatinine 1.87 => 1.6.    She got a unit PRBCs yesterday.   Echo: EF 55% with lateral hypokinesis. Aortic valve could not be assessed with Impella present.   Objective:   Weight Range: 68.3 kg Body mass index is 26.67 kg/m.   Vital Signs:   Temp:  [97.7 F (36.5 C)-98.6 F (37 C)] 97.7 F (36.5 C) (02/12 0400) Pulse Rate:  [67-93] 78 (02/12 0700) Resp:  [19-43] 26 (02/12 0700) BP: (85-118)/(47-84) 118/63 (02/12 0700) SpO2:  [90 %-100 %] 96 % (02/12 0700) Weight:  [68.3 kg] 68.3 kg (02/12 0450) Last BM Date: 05/17/18  Weight change: Filed Weights   05/21/18 0500 05/22/18 0455 05/23/18 0450  Weight: 67.5 kg 65.5 kg 68.3 kg    Intake/Output:   Intake/Output Summary (Last 24 hours) at 05/23/2018 0747 Last data filed at 05/23/2018 0700 Gross per 24 hour  Intake 2659.78 ml  Output 375 ml  Net 2284.78 ml      Physical Exam   CVP 14-15 General: Dyspneic Neck: JVP 12 cm, no thyromegaly or thyroid nodule.  Lungs: Crackles at bases.  CV: Nondisplaced PMI.  Heart regular S1/S2, no S3/S4, 2/6 SEM RUSB.  No peripheral edema.  Abdomen: Soft, nontender, no hepatosplenomegaly, no distention.  Skin: Intact without lesions or rashes.  Neurologic: Alert and oriented x 3.  Psych: Normal affect. Extremities: No clubbing or cyanosis. Bilateral groin cath sites benign.   HEENT: Normal.    Telemetry   NSR 90s  Labs    CBC Recent Labs    05/22/18 2115 05/23/18 0354  WBC 19.3* 16.8*  HGB 9.5* 9.7*  HCT 29.4* 30.2*  MCV 81.9 82.3  PLT 145* 355*   Basic Metabolic Panel Recent Labs    05/22/18 0410 05/23/18 0354  NA 136 136  K 3.4* 3.7  CL 106 106  CO2 19*  21*  GLUCOSE 300* 251*  BUN 34* 30*  CREATININE 1.87* 1.60*  CALCIUM 7.0* 7.4*   Liver Function Tests No results for input(s): AST, ALT, ALKPHOS, BILITOT, PROT, ALBUMIN in the last 72 hours. No results for input(s): LIPASE, AMYLASE in the last 72 hours. Cardiac Enzymes No results for input(s): CKTOTAL, CKMB, CKMBINDEX, TROPONINI in the last 72 hours.  BNP: BNP (last 3 results) Recent Labs    05/19/18 1850  BNP 870.3*    ProBNP (last 3 results) Recent Labs    01/17/18 1122  PROBNP 1,194*     D-Dimer No results for input(s): DDIMER in the last 72 hours. Hemoglobin A1C No results for input(s): HGBA1C in the last 72 hours. Fasting Lipid Panel Recent Labs    05/21/18 0507  CHOL 83  HDL 33*  LDLCALC 33  TRIG 85  CHOLHDL 2.5   Thyroid Function Tests No results for input(s): TSH, T4TOTAL, T3FREE, THYROIDAB in the last 72 hours.  Invalid input(s): FREET3  Other results:   Imaging    No results found.   Medications:     Scheduled Medications: . aspirin  81 mg Oral Daily  . atorvastatin  80 mg Oral q1800  . Chlorhexidine Gluconate Cloth  6 each Topical Daily  . docusate sodium  100 mg Oral Daily  . insulin aspart  0-15 Units Subcutaneous TID WC  . insulin aspart  0-5 Units Subcutaneous QHS  . insulin detemir  10 Units Subcutaneous QHS  . mouth rinse  15 mL Mouth Rinse BID  . pantoprazole  40 mg Oral BID  . sodium chloride flush  10-40 mL Intracatheter Q12H  . sodium chloride flush  3 mL Intravenous Q12H  . THROMBI-PAD  1 each Topical Once  . ticagrelor  90 mg Oral BID  . vancomycin variable dose per unstable renal function (pharmacist dosing)   Does not apply See admin instructions    Infusions: . sodium chloride Stopped (05/20/18 0700)  . sodium chloride Stopped (05/22/18 1310)  . amiodarone 30 mg/hr (05/23/18 0700)  . ceFEPime (MAXIPIME) IV Stopped (05/22/18 0930)  . nitroGLYCERIN Stopped (05/19/18 1034)  . norepinephrine (LEVOPHED) Adult  infusion 7 mcg/min (05/23/18 0700)    PRN Medications: sodium chloride, acetaminophen, amiodarone, diazepam, lip balm, ondansetron (ZOFRAN) IV, sodium chloride flush, sodium chloride flush    Patient Profile   [name] a 85 y.o.femalewith known CAD s/p NSTEMI in 2019 with cardiac cath showing critical stenosis of the distal LMextending into the proximal LAD and circumflexand chronic TOCof the RCAw/ collaterals, turned down for CABG due to advanced aged and poor functional capacity and later underwentatherectomy with Impella hemodynamic support andtheleft main bifurcation w/stenting in November 2019, presenting back with unstable angina and EKG c/w acute STEMI.   Assessment/Plan   1. CAD: STEMI with 95+% left main, now s/p PCI to left main with Impella support.  She has known chronic occlusion of RCA.  Currently, no chest pain.  Troponin peaked 60.  - Continue ASA 81 and ticagrelor.  - Continue atorvastatin 80 mg daily.  2. Cardiogenic shock: In setting of acute MI.  Echo this admission showed EF down from normal range on most recent echo to 20%.  Ischemic cardiomyopathy.  Impella placed in cath lab.  Repeat echo on 2/10 showed EF up to 55% with lateral hypokinesis.  Impella removed 2/11 and heparin + milrinone stopped.  We are now titrating off norepinephrine.  CVP 14-15 today, more short of breath. Creatinine down to 1.6.  - Wean off norepinephrine.  - Lasix 80 mg IV bid today + metolazone 2.5 x 1.   3. Aortic stenosis: Moderate on echo in 10/19.  Unable to assess on last echo given presence of Impella catheter.   - Repeat echo with Impella out to reassess EF without Impella and assess aortic stenosis.  4. Type II DM: SSI 5. Hemolysis: Impella-associated.  Got 1 unit PRBCs yesterday.  Should be resolved with Impella out, hgb to 9.7.   6. AKI; Creatinine down to 1.6.  7. Anemia: Suspect this was Impella-related with hemolysis noted. 8. Thrombocytopenia: Mild, suspect  related to Impella hemolysis.  9. NSVT: Off milrinone and coming off NE, will stop amiodarone.   10. ID: Afebrile, WBCs 16.8, blood cultures NGTD.  CXR w/o definite PNA. Respiratory virus panel was negative.   - Think we can stop vancomycin, continue cefepime for now.    CRITICAL CARE Performed by: Loralie Champagne  Total critical care time: 35 minutes  Critical care time was exclusive of separately billable procedures and treating other patients.  Critical care was necessary to treat or prevent imminent or life-threatening deterioration.  Critical care was time spent personally by me on the following activities: development of treatment plan with patient and/or surrogate as well as nursing, discussions  with consultants, evaluation of patient's response to treatment, examination of patient, obtaining history from patient or surrogate, ordering and performing treatments and interventions, ordering and review of laboratory studies, ordering and review of radiographic studies, pulse oximetry and re-evaluation of patient's condition.  Length of Stay: [place], MD  05/23/2018, 7:47 AM  Advanced Heart Failure Team Pager [phone] (M-F; 7a - 4p)  Please contact [facility] for night-coverage after hours (4p -7a ) and weekends on [email or web address]

## 2018-05-23 NOTE — Evaluation (Signed)
Physical Therapy Evaluation Patient Details Name: [name] MRN: [id number] DOB: 1930/03/14 Today's Date: 05/23/2018   History of Present Illness  Pt adm with chest pain and found to have STEMI. Pt found to have critical stenosis of left main coronary artery and underwent emergent PCI and placed on impella on 2/8. Impella removed on 2/11. PMH - CAD, chf, DM, rt hip fx 02/2017, rt wrist fx 02/2017  Clinical Impression  Pt admitted with above diagnosis and presents to PT with functional limitations due to deficits listed below (See PT problem list). Pt needs skilled PT to maximize independence and safety to allow discharge to ST-SNF. Pt would like to return home. Per nursing pt's family has other caregiving demands and not clear if they can provide 24 hour assist for pt.      Follow Up Recommendations SNF(Unless family can provide 24 hour assist)    Equipment Recommendations  None recommended by PT    Recommendations for Other Services       Precautions / Restrictions Precautions Precautions: Fall Precaution Comments: pt with urinary incontinence Restrictions Weight Bearing Restrictions: No      Mobility  Bed Mobility Overal bed mobility: Needs Assistance Bed Mobility: Supine to Sit     Supine to sit: Mod assist     General bed mobility comments: assist to raise trunk and to position hips at EOB with bed pad  Transfers Overall transfer level: Needs assistance Equipment used: 4-wheeled walker Transfers: Sit to/from Stand Sit to Stand: +2 physical assistance;Min assist         General transfer comment: Assist to bring hips up. Verbal cues to stand more erect and for hand placement  Ambulation/Gait Ambulation/Gait assistance: Min assist;+2 safety/equipment Gait Distance (Feet): 6 Feet Assistive device: 4-wheeled walker Gait Pattern/deviations: Step-through pattern;Decreased step length - right;Decreased step length - left;Trunk flexed;Shuffle Gait velocity:  decr Gait velocity interpretation: <1.31 ft/sec, indicative of household ambulator General Gait Details: Assist for balance. Distance limited by urinary incontinence  Stairs            Wheelchair Mobility    Modified Rankin (Stroke Patients Only)       Balance Overall balance assessment: Needs assistance Sitting-balance support: No upper extremity supported;Feet supported Sitting balance-Leahy Scale: Fair     Standing balance support: Bilateral upper extremity supported Standing balance-Leahy Scale: Poor Standing balance comment: rollator and min guard for static standing                             Pertinent Vitals/Pain Pain Assessment: Faces Faces Pain Scale: Hurts a little bit Pain Location: back Pain Descriptors / Indicators: Sore;Discomfort Pain Intervention(s): Monitored during session    Home Living Family/patient expects to be discharged to:: Private residence Living Arrangements: Children(son) Available Help at Discharge: Family;Available PRN/intermittently Type of Home: House Home Access: Stairs to enter Entrance Stairs-Rails: Right;Left;Can reach both Entrance Stairs-Number of Steps: 3 Home Layout: One level Home Equipment: Walker - 2 wheels;Transport chair;Walker - 4 wheels;Bedside commode;Shower seat Additional Comments: daughter lives on same property, but has an ill family member and an 39 year old autistic son, unsure how much assistance she can offer    Prior Function Level of Independence: Needs assistance   Gait / Transfers Assistance Needed: amb in house modified independent  ADL's / Homemaking Assistance Needed: modified independent with bathing/dressing. Daughter performs cooking/cleaning        Hand Dominance   Dominant Hand: Right  Extremity/Trunk Assessment   Upper Extremity Assessment Upper Extremity Assessment: Defer to OT evaluation    Lower Extremity Assessment Lower Extremity Assessment: Generalized  weakness       Communication   Communication: No difficulties  Cognition Arousal/Alertness: Awake/alert Behavior During Therapy: WFL for tasks assessed/performed Overall Cognitive Status: Within Functional Limits for tasks assessed                                        General Comments      Exercises     Assessment/Plan    PT Assessment Patient needs continued PT services  PT Problem List Decreased strength;Decreased activity tolerance;Decreased balance;Decreased mobility       PT Treatment Interventions DME instruction;Gait training;Functional mobility training;Therapeutic activities;Therapeutic exercise;Balance training;Patient/family education    PT Goals (Current goals can be found in the Care Plan section)  Acute Rehab PT Goals Patient Stated Goal: to return home PT Goal Formulation: With patient Time For Goal Achievement: 06/06/18 Potential to Achieve Goals: Good    Frequency Min 3X/week   Barriers to discharge Decreased caregiver support unsure if 24 hour support    Co-evaluation PT/OT/SLP Co-Evaluation/Treatment: Yes Reason for Co-Treatment: For patient/therapist safety PT goals addressed during session: Mobility/safety with mobility OT goals addressed during session: ADL's and self-care       AM-PAC PT "6 Clicks" Mobility  Outcome Measure Help needed turning from your back to your side while in a flat bed without using bedrails?: A Little Help needed moving from lying on your back to sitting on the side of a flat bed without using bedrails?: A Lot Help needed moving to and from a bed to a chair (including a wheelchair)?: A Little Help needed standing up from a chair using your arms (e.g., wheelchair or bedside chair)?: A Little Help needed to walk in hospital room?: A Little Help needed climbing 3-5 steps with a railing? : Total 6 Click Score: 15    End of Session Equipment Utilized During Treatment: Gait belt Activity Tolerance:  Patient limited by fatigue Patient left: in chair;with call bell/phone within reach;with chair alarm set Nurse Communication: Mobility status PT Visit Diagnosis: Other abnormalities of gait and mobility (R26.89);Muscle weakness (generalized) (M62.81)    Time: 5009-3818 PT Time Calculation (min) (ACUTE ONLY): 31 min   Charges:   PT Evaluation $PT Eval Moderate Complexity: [place] Pager [phone] Office [place] 05/23/2018, 12:22 PM

## 2018-05-23 NOTE — Evaluation (Signed)
Occupational Therapy Evaluation Patient Details Name: [name] MRN: [id number] DOB: Nov 24, 1929 Today's Date: 05/23/2018    History of Present Illness Pt adm with chest pain and found to have STEMI. Pt found to have critical stenosis of left main coronary artery and underwent emergent PCI and placed on impella on 2/8. Impella removed on 2/11. PMH - CAD, chf, DM, rt hip fx 02/2017, rt wrist fx 02/2017   Clinical Impression   Pt was living with her son and assisted for all IADL. She walked household distances with a rollator and was modified independent in self care. Pt reports she is primarily sedentary. Pt presents with generalized weakness and impaired standing balance. She becomes SOB easily and demonstrates decreased activity tolerance. Unclear how much assistance pt will have once she returns home. Recommending SNF for ST rehab unless family can provide 24 hour care. Will follow.    Follow Up Recommendations  SNF;Supervision/Assistance - 24 hour    Equipment Recommendations       Recommendations for Other Services       Precautions / Restrictions Precautions Precautions: Fall Precaution Comments: pt with urinary incontinence Restrictions Weight Bearing Restrictions: No      Mobility Bed Mobility Overal bed mobility: Needs Assistance Bed Mobility: Supine to Sit     Supine to sit: Mod assist     General bed mobility comments: assist to raise trunk and to position hips at EOB with bed pad  Transfers Overall transfer level: Needs assistance Equipment used: 4-wheeled walker Transfers: Sit to/from Stand Sit to Stand: +2 physical assistance;Min assist         General transfer comment: assist to rise and steady, cues for hand placement and upright posture    Balance Overall balance assessment: Needs assistance   Sitting balance-Leahy Scale: Fair     Standing balance support: Bilateral upper extremity supported Standing balance-Leahy Scale: Poor                              ADL either performed or assessed with clinical judgement   ADL Overall ADL's : Needs assistance/impaired Eating/Feeding: Independent;Sitting   Grooming: Set up;Sitting;Brushing hair   Upper Body Bathing: Minimal assistance;Sitting   Lower Body Bathing: Maximal assistance;Sit to/from stand   Upper Body Dressing : Minimal assistance;Sitting   Lower Body Dressing: Maximal assistance;Bed level;Sit to/from stand   Toilet Transfer: Minimal assistance;Ambulation;BSC;RW   Toileting- Clothing Manipulation and Hygiene: Maximal assistance;Sit to/from stand       Functional mobility during ADLs: Minimal assistance;Rolling walker;+2 for safety/equipment       Vision Patient Visual Report: No change from baseline       Perception     Praxis      Pertinent Vitals/Pain Pain Assessment: Faces Faces Pain Scale: Hurts a little bit Pain Location: back Pain Descriptors / Indicators: Sore;Discomfort Pain Intervention(s): Monitored during session;Repositioned     Hand Dominance Right   Extremity/Trunk Assessment Upper Extremity Assessment Upper Extremity Assessment: Generalized weakness   Lower Extremity Assessment Lower Extremity Assessment: Defer to PT evaluation       Communication Communication Communication: No difficulties   Cognition Arousal/Alertness: Awake/alert Behavior During Therapy: WFL for tasks assessed/performed Overall Cognitive Status: Within Functional Limits for tasks assessed                                     General Comments  Exercises     Shoulder Instructions      Home Living Family/patient expects to be discharged to:: Private residence Living Arrangements: Children(son) Available Help at Discharge: Family;Available PRN/intermittently Type of Home: House Home Access: Stairs to enter [employer] of Steps: 3 Entrance Stairs-Rails: Right;Left;Can reach both Home Layout: One  level     Bathroom Shower/Tub: Teacher, early years/pre: Standard Bathroom Accessibility: Yes   Home Equipment: Gilford Rile - 2 wheels;Transport chair;Walker - 4 wheels;Bedside commode;Shower seat   Additional Comments: daughter lives on same property, but has an ill family member and an 8 year old autistic son, unsure how much assistance she can offer      Prior Functioning/Environment Level of Independence: Needs assistance  Gait / Transfers Assistance Needed: amb in house modified independent ADL's / Homemaking Assistance Needed: modified independent            OT Problem List: Decreased strength;Decreased activity tolerance;Impaired balance (sitting and/or standing);Decreased knowledge of use of DME or AE      OT Treatment/Interventions: Self-care/ADL training;DME and/or AE instruction;Therapeutic activities;Patient/family education;Balance training    OT Goals(Current goals can be found in the care plan section) Acute Rehab OT Goals Patient Stated Goal: to return home OT Goal Formulation: With patient Time For Goal Achievement: 06/06/18 Potential to Achieve Goals: Good ADL Goals Pt Will Perform Grooming: with supervision;standing Pt Will Perform Lower Body Bathing: with supervision;with adaptive equipment;sit to/from stand Pt Will Perform Lower Body Dressing: with supervision;sit to/from stand;with adaptive equipment Pt Will Transfer to Toilet: with supervision;ambulating Pt Will Perform Toileting - Clothing Manipulation and hygiene: with supervision;sit to/from stand Additional ADL Goal #1: Pt will utilize energy conservation strategies during ADL and mobility independently.  OT Frequency: Min 2X/week   Barriers to D/C: Decreased caregiver support          Co-evaluation PT/OT/SLP Co-Evaluation/Treatment: Yes Reason for Co-Treatment: For patient/therapist safety   OT goals addressed during session: ADL's and self-care      AM-PAC OT "6 Clicks" Daily  Activity     Outcome Measure Help from another person eating meals?: None Help from another person taking care of personal grooming?: A Little Help from another person toileting, which includes using toliet, bedpan, or urinal?: A Lot Help from another person bathing (including washing, rinsing, drying)?: A Lot Help from another person to put on and taking off regular upper body clothing?: A Little Help from another person to put on and taking off regular lower body clothing?: A Lot 6 Click Score: 16   End of Session Equipment Utilized During Treatment: Gait belt;Rolling walker Nurse Communication: Mobility status(family situation)  Activity Tolerance: Patient tolerated treatment well Patient left: in chair;with call bell/phone within reach;with chair alarm set  OT Visit Diagnosis: Unsteadiness on feet (R26.81);Other abnormalities of gait and mobility (R26.89);Muscle weakness (generalized) (M62.81)                Time: 1030-1055 OT Time Calculation (min): 25 min Charges:  OT General Charges $OT Visit: 1 Visit OT Evaluation $OT Eval Moderate Complexity: 1 Mod  Nestor Lewandowsky, OTR/L Acute Rehabilitation Services Pager: [phone] Office: [phone]  Malka So 05/23/2018, 11:16 AM

## 2018-05-23 NOTE — Progress Notes (Signed)
  Echocardiogram 2D Echocardiogram has been performed.  [name] 05/23/2018, 2:39 PM

## 2018-05-24 LAB — BASIC METABOLIC PANEL
Anion gap: 14 (ref 5–15)
BUN: 38 mg/dL — ABNORMAL HIGH (ref 8–23)
CO2: 24 mmol/L (ref 22–32)
Calcium: 8.3 mg/dL — ABNORMAL LOW (ref 8.9–10.3)
Chloride: 97 mmol/L — ABNORMAL LOW (ref 98–111)
Creatinine, Ser: 1.78 mg/dL — ABNORMAL HIGH (ref 0.44–1.00)
GFR calc Af Amer: 29 mL/min — ABNORMAL LOW (ref >60)
GFR calc non Af Amer: 25 mL/min — ABNORMAL LOW (ref >60)
GLUCOSE: 141 mg/dL — AB (ref 70–99)
Potassium: 2.9 mmol/L — ABNORMAL LOW (ref 3.5–5.1)
Sodium: 135 mmol/L (ref 135–145)

## 2018-05-24 LAB — GLUCOSE, CAPILLARY
GLUCOSE-CAPILLARY: 169 mg/dL — AB (ref 70–99)
Glucose-Capillary: 240 mg/dL — ABNORMAL HIGH (ref 70–99)
Glucose-Capillary: 194 mg/dL — ABNORMAL HIGH (ref 70–99)
Glucose-Capillary: 216 mg/dL — ABNORMAL HIGH (ref 70–99)

## 2018-05-24 LAB — CBC
HCT: 31.9 % — ABNORMAL LOW (ref 36.0–46.0)
Hemoglobin: 10.5 g/dL — ABNORMAL LOW (ref 12.0–15.0)
MCH: 26.6 pg (ref 26.0–34.0)
MCHC: 32.9 g/dL (ref 30.0–36.0)
MCV: 80.8 fL (ref 80.0–100.0)
Platelets: 184 10*3/uL (ref 150–400)
RBC: 3.95 MIL/uL (ref 3.87–5.11)
RDW: 15.8 % — ABNORMAL HIGH (ref 11.5–15.5)
WBC: 12 10*3/uL — ABNORMAL HIGH (ref 4.0–10.5)
nRBC: 0 % (ref 0.0–0.2)

## 2018-05-24 LAB — COOXEMETRY PANEL
Carboxyhemoglobin: 1.7 % — ABNORMAL HIGH (ref 0.5–1.5)
Methemoglobin: 1 % (ref 0.0–1.5)
O2 Saturation: 56.9 %
Total hemoglobin: 11.1 g/dL — ABNORMAL LOW (ref 12.0–16.0)

## 2018-05-24 LAB — MAGNESIUM: Magnesium: 1.9 mg/dL (ref 1.7–2.4)

## 2018-05-24 LAB — LACTATE DEHYDROGENASE: LDH: 361 U/L — AB (ref 98–192)

## 2018-05-24 MED ORDER — POTASSIUM CHLORIDE CRYS ER 20 MEQ PO TBCR
40.0000 meq | EXTENDED_RELEASE_TABLET | Freq: Once | ORAL | Status: AC
  Administered 2018-05-24: 40 meq via ORAL
  Filled 2018-05-24: qty 2

## 2018-05-24 MED ORDER — FUROSEMIDE 40 MG PO TABS
40.0000 mg | ORAL_TABLET | Freq: Every day | ORAL | Status: DC
  Administered 2018-05-24 – 2018-05-25 (×2): 40 mg via ORAL
  Filled 2018-05-24 (×2): qty 1

## 2018-05-24 NOTE — Progress Notes (Signed)
[id number] Came to walk with pt but she declined due to feeling so tired. Resting in bed. Discussed with pt that she has to take her brilinta twice a day. Will refer to [place] [facility] 2 but pt stated unable to do. Only walks in house mainly.  Will continue to follow pt.  Was seen by CR in November. Graylon Good RN BSN 05/24/2018 2:50 PM

## 2018-05-24 NOTE — Progress Notes (Signed)
Occupational Therapy Treatment Patient Details Name: [name] MRN: [id number] DOB: [birth year] Today's Date: 05/24/2018    History of present illness Pt adm with chest pain and found to have STEMI. Pt found to have critical stenosis of left main coronary artery and underwent emergent PCI and placed on impella on 2/8. Impella removed on 2/11. PMH - CAD, chf, DM, rt hip fx 02/2017, rt wrist fx 02/2017   OT comments  Pt agreeable only to activities sitting at EOB for grooming. Educated in energy conservation strategies. Per chart, pt will have 24 hour assistance at home of her children. Updated d/c disposition to home with home health.  Follow Up Recommendations  Home health OT;Supervision/Assistance - 24 hour    Equipment Recommendations       Recommendations for Other Services      Precautions / Restrictions Precautions Precautions: Fall Precaution Comments: pt with urinary incontinence Restrictions Weight Bearing Restrictions: No       Mobility Bed Mobility Overal bed mobility: Needs Assistance Bed Mobility: Sidelying to Sit;Sit to Sidelying   Sidelying to sit: Min assist     Sit to sidelying: Mod assist General bed mobility comments: min assist to raise trunk, mod assist for LEs into bed  Transfers Overall transfer level: Needs assistance Equipment used: 4-wheeled walker Transfers: Sit to/from [employer] Transfers Sit to Stand: Min assist Stand pivot transfers: Min assist       General transfer comment: pt declined OOB, wants to take a nap    Balance Overall balance assessment: Needs assistance Sitting-balance support: No upper extremity supported;Feet supported Sitting balance-Leahy Scale: Fair     Standing balance support: Bilateral upper extremity supported Standing balance-Leahy Scale: Poor Standing balance comment: rollator and min guard for static standing                           ADL either performed or assessed with  clinical judgement   ADL Overall ADL's : Needs assistance/impaired     Grooming: Oral care;Sitting                 Lower Body Dressing Details (indicate cue type and reason): pt unwilling to attempt               General ADL Comments: Verbally educated pt in energy conservation strategies.     Vision       Perception     Praxis      Cognition Arousal/Alertness: Awake/alert Behavior During Therapy: WFL for tasks assessed/performed Overall Cognitive Status: Within Functional Limits for tasks assessed                                 General Comments: pt with low motivation        Exercises     Shoulder Instructions       General Comments      Pertinent Vitals/ Pain       Pain Assessment: No/denies pain  Home Living                                          Prior Functioning/Environment              Frequency  Min 2X/week        Progress Toward Goals  OT Goals(current goals  can now be found in the care plan section)  Progress towards OT goals: Progressing toward goals  Acute Rehab OT Goals Patient Stated Goal: to return home OT Goal Formulation: With patient Time For Goal Achievement: 06/06/18 Potential to Achieve Goals: Good  Plan Discharge plan remains appropriate    Co-evaluation                 AM-PAC OT "6 Clicks" Daily Activity     Outcome Measure   Help from another person eating meals?: None Help from another person taking care of personal grooming?: A Little Help from another person toileting, which includes using toliet, bedpan, or urinal?: A Lot Help from another person bathing (including washing, rinsing, drying)?: A Lot Help from another person to put on and taking off regular upper body clothing?: A Little Help from another person to put on and taking off regular lower body clothing?: A Lot 6 Click Score: 16    End of Session    OT Visit Diagnosis: Unsteadiness on feet  (R26.81);Other abnormalities of gait and mobility (R26.89);Muscle weakness (generalized) (M62.81)   Activity Tolerance Patient limited by fatigue   Patient Left in bed;with call bell/phone within reach;with bed alarm set   Nurse Communication          Time: 6286-3817 OT Time Calculation (min): 16 min  Charges: OT General Charges $OT Visit: 1 Visit OT Treatments $Self Care/Home Management : 8-22 mins  Nestor Lewandowsky, OTR/L Acute Rehabilitation Services Pager: [phone] Office: [phone]   Malka So 05/24/2018, 3:04 PM

## 2018-05-24 NOTE — Clinical Social Work Note (Signed)
Clinical Social Work Assessment  Patient Details  Name: FLORANCE PAOLILLO MRN: [id number] Date of Birth: [birth year]  Date of referral:  05/24/18               Reason for consult:  Facility Placement, Discharge Planning                Permission sought to share information with:  Family Supports Permission granted to share information::  Yes, Verbal Permission Granted  Name::     [name]  Agency::     Relationship::  daughter  Contact Information:  [phone]  Housing/Transportation Living arrangements for the past 2 months:  [place] of Information:  Patient, Adult Children Patient Interpreter Needed:  None Criminal Activity/Legal Involvement Pertinent to Current Situation/Hospitalization:  No - Comment as needed Significant Relationships:  Adult Children Lives with:  Adult Children Do you feel safe going back to the place where you live?  Yes Need for family participation in patient care:  Yes (Comment)  Care giving concerns: Patient from home with son. PT recommending SNF/24 hour care. CSW consulted for SNF placement, as care team not sure of patient's supports at home.   Social Worker assessment / plan: CSW met with patient at bedside. Patient alert and oriented. CSW introduced self and role and discussed disposition planning - PT recommendation for SNF.  Patient reported that she lives with her son and her daughter lives in a mobile home on the property. Her daughter does not work and can provide care for her during the day. Her son can provide care when he gets home from work in the evenings. Patient stated that her doctor told her she can go home if she has 24 hour care. Patient declines SNF and opts to go home with her son and daughter.  CSW spoke to patient's daughter, [name], on the phone. Debbie confirmed what patient had reported; they will provide 24 hour care for patient. Debbie indicated she has assisted patient with recovery after her previous  hospitalizations as well. Patient and daughter confirm they have all DME they need, including transport chair, shower chair, bedside commode, and walker.   Referred to [facility] for home health needs and signed off. No additional needs identified. Please re-consult if additional needs arise or disposition plan changes.  Employment status:  Retired Charity fundraiser) PT Recommendations:  [place] / Referral to community resources:     Patient/Family's Response to care: Patient and family appreciative of care.  Patient/Family's Understanding of and Emotional Response to Diagnosis, Current Treatment, and Prognosis: Patient and family with understanding of patient's conditions and care needs. They have declined SNF and patient will return home at discharge.  Emotional Assessment Appearance:  Appears stated age Attitude/Demeanor/Rapport:  Engaged Affect (typically observed):  Accepting, Calm, Appropriate Orientation:  Oriented to Self, Oriented to Place, Oriented to  Time, Oriented to Situation Alcohol / Substance use:  Not Applicable Psych involvement (Current and /or in the community):  No (Comment)  Discharge Needs  Concerns to be addressed:  Discharge Planning Concerns, Care Coordination Readmission within the last 30 days:  No Current discharge risk:  Physical Impairment Barriers to Discharge:  Continued Medical Work up   Estanislado Emms, LCSW 05/24/2018, 12:10 PM

## 2018-05-24 NOTE — Progress Notes (Addendum)
Physical Therapy Treatment Patient Details Name: [name] MRN: [id number] DOB: [birth year] Today's Date: 05/24/2018    History of Present Illness Pt adm with chest pain and found to have STEMI. Pt found to have critical stenosis of left main coronary artery and underwent emergent PCI and placed on impella on 2/8. Impella removed on 2/11. PMH - CAD, chf, DM, rt hip fx 02/2017, rt wrist fx 02/2017    PT Comments    Pt making steady progress with mobility but continues to require some assist with all mobility. Pt wants to return home with her daughter's assistance. Pt feels certain her daughter is able to provide this. Per social work note daughter confirms she will be able to provide this.   Follow Up Recommendations  Home health PT;Supervision/Assistance - 24 hour     Equipment Recommendations  None recommended by PT    Recommendations for Other Services       Precautions / Restrictions Precautions Precautions: Fall Precaution Comments: pt with urinary incontinence    Mobility  Bed Mobility               General bed mobility comments: Pt on EOB with nursing  Transfers Overall transfer level: Needs assistance Equipment used: 4-wheeled walker Transfers: Sit to/from [facility] Sit to Stand: Min assist Stand pivot transfers: Min assist       General transfer comment: Assist to bring hips up and for balance. Bed to bsc with rollator.  Ambulation/Gait Ambulation/Gait assistance: Min assist Gait Distance (Feet): 50 Feet Assistive device: 4-wheeled walker Gait Pattern/deviations: Step-through pattern;Decreased step length - right;Decreased step length - left;Trunk flexed;Shuffle Gait velocity: decr Gait velocity interpretation: <1.31 ft/sec, indicative of household ambulator General Gait Details: Assist for balance. Pt amb on RA with dyspnea 3/4 and SpO2 98%   Stairs             Wheelchair Mobility    Modified Rankin (Stroke Patients  Only)       Balance Overall balance assessment: Needs assistance Sitting-balance support: No upper extremity supported;Feet supported Sitting balance-Leahy Scale: Fair     Standing balance support: Bilateral upper extremity supported Standing balance-Leahy Scale: Poor Standing balance comment: rollator and min guard for static standing                            Cognition Arousal/Alertness: Awake/alert Behavior During Therapy: WFL for tasks assessed/performed Overall Cognitive Status: Within Functional Limits for tasks assessed                                        Exercises      General Comments        Pertinent Vitals/Pain Pain Assessment: No/denies pain    Home Living                      Prior Function            PT Goals (current goals can now be found in the care plan section) Progress towards PT goals: Progressing toward goals    Frequency    Min 3X/week      PT Plan Discharge plan needs to be updated    Co-evaluation              AM-PAC PT "6 Clicks" Mobility   Outcome Measure  Help needed turning  from your back to your side while in a flat bed without using bedrails?: A Little Help needed moving from lying on your back to sitting on the side of a flat bed without using bedrails?: A Little Help needed moving to and from a bed to a chair (including a wheelchair)?: A Little Help needed standing up from a chair using your arms (e.g., wheelchair or bedside chair)?: A Little Help needed to walk in hospital room?: A Little Help needed climbing 3-5 steps with a railing? : Total 6 Click Score: 16    End of Session Equipment Utilized During Treatment: Gait belt Activity Tolerance: Patient limited by fatigue Patient left: in chair;with call bell/phone within reach Nurse Communication: Mobility status PT Visit Diagnosis: Other abnormalities of gait and mobility (R26.89);Muscle weakness (generalized)  (M62.81)     Time: 3557-3220 PT Time Calculation (min) (ACUTE ONLY): 22 min  Charges:  $Gait Training: 8-22 mins                     Alice Pager [phone] Office [place] 05/24/2018, 12:18 PM

## 2018-05-24 NOTE — Progress Notes (Signed)
Patient ID: [name], female   DOB: 10/16/1929, 85 y.o.   MRN: [id number]     Advanced Heart Failure Rounding Note  PCP-Cardiologist: Sherren Mocha, MD   Subjective:    Impella removed on 2/11 and milrinone stopped.  Now off norepinephrine.  SBP 90s-110s.   She diuresed very well yesterday, breathing improved, weight down 5 lbs.  CVP now 3-4.  Creatinine 1.87 => 1.6 => 1.78.    She got a unit PRBCs 2/11.  Hemoglobin and platelets trending up with Impella out.   Echo: EF 55% with lateral hypokinesis. Aortic valve could not be assessed with Impella present.  Echo (post-Impella): EF 50-55%, moderate MR, mild-moderate AS.   Objective:   Weight Range: 65.9 kg Body mass index is 25.74 kg/m.   Vital Signs:   Temp:  [98.2 F (36.8 C)-99.7 F (37.6 C)] 98.2 F (36.8 C) (02/13 0700) Pulse Rate:  [79-100] 91 (02/13 0700) Resp:  [16-32] 23 (02/13 0700) BP: (79-122)/(43-75) 98/54 (02/13 0700) SpO2:  [92 %-99 %] 96 % (02/13 0700) Weight:  [65.9 kg] 65.9 kg (02/13 0500) Last BM Date: 05/17/18  Weight change: Filed Weights   05/22/18 0455 05/23/18 0450 05/24/18 0500  Weight: 65.5 kg 68.3 kg 65.9 kg    Intake/Output:   Intake/Output Summary (Last 24 hours) at 05/24/2018 0744 Last data filed at 05/24/2018 0700 Gross per 24 hour  Intake 462.68 ml  Output 3250 ml  Net -2787.32 ml      Physical Exam   CVP 3-4 General: NAD Neck: No JVD, no thyromegaly or thyroid nodule.  Lungs: Clear to auscultation bilaterally with normal respiratory effort. CV: Nondisplaced PMI.  Heart regular S1/S2, no S3/S4, 2/6 SEM RUSB.  No peripheral edema.   Abdomen: Soft, nontender, no hepatosplenomegaly, no distention.  Skin: Intact without lesions or rashes.  Neurologic: Alert and oriented x 3.  Psych: Normal affect. Extremities: No clubbing or cyanosis.  HEENT: Normal.    Telemetry   NSR 80s (personally reviewed)  Labs    CBC Recent Labs    05/23/18 0354 05/24/18 0507  WBC 16.8*  12.0*  HGB 9.7* 10.5*  HCT 30.2* 31.9*  MCV 82.3 80.8  PLT 145* 696   Basic Metabolic Panel Recent Labs    05/23/18 0354 05/24/18 0507  NA 136 135  K 3.7 2.9*  CL 106 97*  CO2 21* 24  GLUCOSE 251* 141*  BUN 30* 38*  CREATININE 1.60* 1.78*  CALCIUM 7.4* 8.3*   Liver Function Tests No results for input(s): AST, ALT, ALKPHOS, BILITOT, PROT, ALBUMIN in the last 72 hours. No results for input(s): LIPASE, AMYLASE in the last 72 hours. Cardiac Enzymes No results for input(s): CKTOTAL, CKMB, CKMBINDEX, TROPONINI in the last 72 hours.  BNP: BNP (last 3 results) Recent Labs    05/19/18 1850  BNP 870.3*    ProBNP (last 3 results) Recent Labs    01/17/18 1122  PROBNP 1,194*     D-Dimer No results for input(s): DDIMER in the last 72 hours. Hemoglobin A1C No results for input(s): HGBA1C in the last 72 hours. Fasting Lipid Panel No results for input(s): CHOL, HDL, LDLCALC, TRIG, CHOLHDL, LDLDIRECT in the last 72 hours. Thyroid Function Tests No results for input(s): TSH, T4TOTAL, T3FREE, THYROIDAB in the last 72 hours.  Invalid input(s): FREET3  Other results:   Imaging    No results found.   Medications:     Scheduled Medications: . aspirin  81 mg Oral Daily  . atorvastatin  80 mg Oral q1800  . Chlorhexidine Gluconate Cloth  6 each Topical Daily  . docusate sodium  100 mg Oral Daily  . furosemide  80 mg Intravenous BID  . furosemide  40 mg Oral Daily  . heparin injection (subcutaneous)  5,000 Units Subcutaneous Q8H  . insulin aspart  0-15 Units Subcutaneous TID WC  . insulin aspart  0-5 Units Subcutaneous QHS  . insulin detemir  10 Units Subcutaneous QHS  . mouth rinse  15 mL Mouth Rinse BID  . pantoprazole  40 mg Oral BID  . potassium chloride  40 mEq Oral Once  . potassium chloride  40 mEq Oral Once  . sodium chloride flush  10-40 mL Intracatheter Q12H  . sodium chloride flush  3 mL Intravenous Q12H  . THROMBI-PAD  1 each Topical Once  .  ticagrelor  90 mg Oral BID    Infusions: . sodium chloride Stopped (05/20/18 0700)  . sodium chloride Stopped (05/22/18 1310)  . ceFEPime (MAXIPIME) IV Stopped (05/23/18 7628)  . nitroGLYCERIN Stopped (05/19/18 1034)  . norepinephrine (LEVOPHED) Adult infusion Stopped (05/23/18 0706)    PRN Medications: sodium chloride, acetaminophen, diazepam, lip balm, ondansetron (ZOFRAN) IV, sodium chloride flush, sodium chloride flush    Patient Profile   [name] a 85 y.o.femalewith known CAD s/p NSTEMI in 2019 with cardiac cath showing critical stenosis of the distal LMextending into the proximal LAD and circumflexand chronic TOCof the RCAw/ collaterals, turned down for CABG due to advanced aged and poor functional capacity and later underwentatherectomy with Impella hemodynamic support andtheleft main bifurcation w/stenting in November 2019, presenting back with unstable angina and EKG c/w acute STEMI.   Assessment/Plan   1. CAD: STEMI with 95+% left main, now s/p PCI to left main with Impella support.  She has known chronic occlusion of RCA.  Currently, no chest pain.  Troponin peaked 60.  - Continue ASA 81 and ticagrelor.  - Continue atorvastatin 80 mg daily.  2. Cardiogenic shock: In setting of acute MI.  Echo this admission showed EF down from normal range on most recent echo to 20%.  Ischemic cardiomyopathy.  Impella placed in cath lab.  Repeat echo on 2/12 showed EF up to 50-55% with lateral hypokinesis.  Impella removed 2/11 and heparin + milrinone stopped.  Now off norepinephrine. CVP 3-4.  Creatinine up slightly to 1.78.   - Stop IV Lasix, can resume home Lasix 40 mg po daily.   - Replace low K this morning.  - Would not start ACEI or beta blockade yet with soft BP.   3. Aortic stenosis: Mild to moderate on echo 2/12.  4. Type II DM: SSI 5. Hemolysis: Impella-associated.  Got 1 unit PRBCs 2/11.  Resolved with Impella out.    6. AKI: Creatinine up slightly to 1.78,  stopping IV Lasix.  7. Anemia: Suspect this was Impella-related with hemolysis noted. 8. Thrombocytopenia: Mild, suspect related to Impella hemolysis. Resolved.  9. NSVT: Off milrinone and NE has resolved.  Now off amiodarone.   10. ID: Afebrile, WBCs coming down, blood cultures NGTD.  CXR w/o definite PNA. Respiratory virus panel was negative.   - Stop cefepime after today.   11. May go to step down today.  Plan for home with home PT and 24 hour family presence.   Length of Stay: [place], [place]  05/24/2018, 7:44 AM  Advanced Heart Failure Team Pager [phone] (M-F; 7a - 4p)  Please contact [facility] for night-coverage after  hours (4p -7a ) and weekends on [email or web address]

## 2018-05-25 LAB — COOXEMETRY PANEL
Carboxyhemoglobin: 1.6 % — ABNORMAL HIGH (ref 0.5–1.5)
Methemoglobin: 1.4 % (ref 0.0–1.5)
O2 Saturation: 58.5 %
Total hemoglobin: 11 g/dL — ABNORMAL LOW (ref 12.0–16.0)

## 2018-05-25 LAB — BASIC METABOLIC PANEL
ANION GAP: 13 (ref 5–15)
BUN: 45 mg/dL — ABNORMAL HIGH (ref 8–23)
CO2: 25 mmol/L (ref 22–32)
Calcium: 8.3 mg/dL — ABNORMAL LOW (ref 8.9–10.3)
Chloride: 97 mmol/L — ABNORMAL LOW (ref 98–111)
Creatinine, Ser: 1.61 mg/dL — ABNORMAL HIGH (ref 0.44–1.00)
GFR calc Af Amer: 33 mL/min — ABNORMAL LOW (ref >60)
GFR calc non Af Amer: 29 mL/min — ABNORMAL LOW (ref >60)
GLUCOSE: 167 mg/dL — AB (ref 70–99)
Potassium: 3.1 mmol/L — ABNORMAL LOW (ref 3.5–5.1)
Sodium: 135 mmol/L (ref 135–145)

## 2018-05-25 LAB — CBC
HEMATOCRIT: 33 % — AB (ref 36.0–46.0)
Hemoglobin: 10.5 g/dL — ABNORMAL LOW (ref 12.0–15.0)
MCH: 25.9 pg — ABNORMAL LOW (ref 26.0–34.0)
MCHC: 31.8 g/dL (ref 30.0–36.0)
MCV: 81.5 fL (ref 80.0–100.0)
Platelets: 209 10*3/uL (ref 150–400)
RBC: 4.05 MIL/uL (ref 3.87–5.11)
RDW: 15.3 % (ref 11.5–15.5)
WBC: 10.8 10*3/uL — ABNORMAL HIGH (ref 4.0–10.5)
nRBC: 0 % (ref 0.0–0.2)

## 2018-05-25 LAB — GLUCOSE, CAPILLARY
GLUCOSE-CAPILLARY: 210 mg/dL — AB (ref 70–99)
Glucose-Capillary: 153 mg/dL — ABNORMAL HIGH (ref 70–99)

## 2018-05-25 MED ORDER — CARVEDILOL 3.125 MG PO TABS: 3.1250 mg | ORAL_TABLET | Freq: Two times a day (BID) | ORAL | 5 refills | Status: DC

## 2018-05-25 MED ORDER — TICAGRELOR 90 MG PO TABS: 90.0000 mg | ORAL_TABLET | Freq: Two times a day (BID) | ORAL | 5 refills | Status: AC

## 2018-05-25 MED ORDER — CARVEDILOL 3.125 MG PO TABS: 3.1250 mg | ORAL_TABLET | Freq: Two times a day (BID) | ORAL | Status: DC

## 2018-05-25 MED ORDER — ATORVASTATIN CALCIUM 80 MG PO TABS: 80.0000 mg | ORAL_TABLET | Freq: Every day | ORAL | 5 refills | Status: DC

## 2018-05-25 MED ORDER — POTASSIUM CHLORIDE CRYS ER 20 MEQ PO TBCR
40.0000 meq | EXTENDED_RELEASE_TABLET | Freq: Once | ORAL | Status: AC
  Administered 2018-05-25: 40 meq via ORAL
  Filled 2018-05-25: qty 2

## 2018-05-25 MED ORDER — CARVEDILOL 3.125 MG PO TABS
3.1250 mg | ORAL_TABLET | Freq: Two times a day (BID) | ORAL | Status: DC
  Administered 2018-05-25: 3.125 mg via ORAL
  Filled 2018-05-25: qty 1

## 2018-05-25 MED ORDER — POTASSIUM CHLORIDE ER 10 MEQ PO TBCR: 20.0000 meq | EXTENDED_RELEASE_TABLET | Freq: Every day | ORAL | 5 refills | Status: DC

## 2018-05-25 NOTE — Progress Notes (Signed)
Occupational Therapy Treatment Patient Details Name: [name] MRN: [id number] DOB: [birth year] Today's Date: 05/25/2018    History of present illness Pt adm with chest pain and found to have STEMI. Pt found to have critical stenosis of left main coronary artery and underwent emergent PCI and placed on impella on 2/8. Impella removed on 2/11. PMH - CAD, chf, DM, rt hip fx 02/2017, rt wrist fx 02/2017   OT comments  Pt performing bed mobility with min to modA for trunk elevation and due to decreased strength. Pt performing sit to stands, transfers and ADL functional mobility in room with RW and minA. Pt exhibiting poor balance with mobility and unable to stand >1 min at a time. Pt requiring seated rest breaks after each task. Pt sitting EOB for LB dressing with maxA - not even attempting as pt stating "I am too weak." pt  maxA for toilet hygiene at commode and transferring to recliner with [place]. Pt would benefit from continued OT skilled services at home, but pt refusing therapy. OT to follow acutely.      Follow Up Recommendations  Home health OT;Supervision/Assistance - 24 hour    Equipment Recommendations  None recommended by OT    Recommendations for Other Services      Precautions / Restrictions Precautions Precautions: Fall Precaution Comments: pt with urinary incontinence Restrictions Weight Bearing Restrictions: No       Mobility Bed Mobility Overal bed mobility: Needs Assistance Bed Mobility: Sidelying to Sit;Sit to Sidelying   Sidelying to sit: Min assist;HOB elevated     Sit to sidelying: Min assist General bed mobility comments: min assist to raise trunk and LEs into bed  Transfers Overall transfer level: Needs assistance Equipment used: Rolling walker (2 wheeled) Transfers: Sit to/from [facility] Sit to Stand: Min assist Stand pivot transfers: Min assist            Balance Overall balance assessment: Needs assistance   Sitting  balance-Leahy Scale: Fair       Standing balance-Leahy Scale: Poor                             ADL either performed or assessed with clinical judgement   ADL Overall ADL's : Needs assistance/impaired                 Upper Body Dressing : Minimal assistance;Sitting   Lower Body Dressing: Maximal assistance;Bed level;Sit to/from stand   Toilet Transfer: Minimal assistance;Ambulation;RW;Comfort height toilet   Toileting- Clothing Manipulation and Hygiene: Maximal assistance;Sit to/from stand       Functional mobility during ADLs: Minimal assistance;Rolling walker General ADL Comments: Verbally educated pt in energy conservation strategies. required sitting rest breaks after donning LB dressing and after toilet hygiene.     Vision       Perception     Praxis      Cognition Arousal/Alertness: Awake/alert Behavior During Therapy: WFL for tasks assessed/performed Overall Cognitive Status: Within Functional Limits for tasks assessed                                          Exercises     Shoulder Instructions       General Comments HR <107 BPM with exertion; SPO2 levels >93% on RA    Pertinent Vitals/ Pain       Pain  Assessment: No/denies pain  Home Living                                          Prior Functioning/Environment              Frequency  Min 2X/week        Progress Toward Goals  OT Goals(current goals can now be found in the care plan section)  Progress towards OT goals: Progressing toward goals  Acute Rehab OT Goals Patient Stated Goal: to return home OT Goal Formulation: With patient Time For Goal Achievement: 06/06/18 Potential to Achieve Goals: Good ADL Goals Pt Will Perform Grooming: with supervision;standing Pt Will Perform Lower Body Bathing: with supervision;with adaptive equipment;sit to/from stand Pt Will Perform Lower Body Dressing: with supervision;sit to/from  stand;with adaptive equipment Pt Will Transfer to Toilet: with supervision;ambulating Pt Will Perform Toileting - Clothing Manipulation and hygiene: with supervision;sit to/from stand Additional ADL Goal #1: Pt will utilize energy conservation strategies during ADL and mobility independently.  Plan Discharge plan remains appropriate    Co-evaluation                 AM-PAC OT "6 Clicks" Daily Activity     Outcome Measure   Help from another person eating meals?: None Help from another person taking care of personal grooming?: A Little Help from another person toileting, which includes using toliet, bedpan, or urinal?: A Lot Help from another person bathing (including washing, rinsing, drying)?: A Lot Help from another person to put on and taking off regular upper body clothing?: A Little Help from another person to put on and taking off regular lower body clothing?: A Lot 6 Click Score: 16    End of Session Equipment Utilized During Treatment: Gait belt;Rolling walker  OT Visit Diagnosis: Unsteadiness on feet (R26.81);Other abnormalities of gait and mobility (R26.89);Muscle weakness (generalized) (M62.81)   Activity Tolerance Patient limited by fatigue   Patient Left in chair;with call bell/phone within reach;with chair alarm set;with family/visitor present   Nurse Communication Mobility status        Time: 1030-1106 OT Time Calculation (min): 36 min  Charges: OT General Charges $OT Visit: 1 Visit OT Treatments $Self Care/Home Management : 8-22 mins $Neuromuscular Re-education: 8-22 mins  Darryl Nestle) Marsa Aris OTR/L Acute Rehabilitation Services Pager: [phone] Office: [phone]    Fredda Hammed 05/25/2018, 11:22 AM

## 2018-05-25 NOTE — Care Management Important Message (Signed)
Important Message  Patient Details  Name: [name] MRN: [id number] Date of Birth: January 16, 1930   Medicare Important Message Given:  Yes    Wilna Pennie P Jaydon Avina 05/25/2018, 11:55 AM

## 2018-05-25 NOTE — Care Management Note (Addendum)
Case Management Note  Patient Details  Name: [name] MRN: [id number] Date of Birth: 19-Sep-1929  Subjective/Objective:  Pt presented for Stemi-s/p PCI. PTA from home with support of daughter. Per family patient has DME RW, [place], BSC. No DME needs at this time.                 Action/Plan: CM did speak with family about [place]- Patient and daughter both declining services. No HH Services to be set up. Patient is aware that if she needs services in the future to contact her PCP. No further needs at this time.   Expected Discharge Date:                  Expected Discharge Plan:  [place]  In-House Referral:  Clinical Social Work  Discharge planning Services  CM Consult, Medication Assistance([place] benefits check)  Post Acute Care Choice:  NA Choice offered to:  NA  DME Arranged:  N/A DME Agency:  NA  HH Arranged:  NA, Patient Refused [place] Agency:  NA  Status of Service:  Completed, signed off  If discussed at [place] of Stay Meetings, dates discussed:    Additional Comments:  Bethena Roys, RN 05/25/2018, 11:50 AM

## 2018-05-25 NOTE — Progress Notes (Signed)
Patient ID: [name], female   DOB: 09/22/1929, 85 y.o.   MRN: [id number]     Advanced Heart Failure Rounding Note  PCP-Cardiologist: Sherren Mocha, MD   Subjective:    Impella removed on 2/11 and milrinone/NE stopped.  BP 120/99 today. She feels good, no chest pain.  Breathing much better with diuresis, now on po Lasix.   Creatinine 1.87 => 1.6 => 1.78 => 1.61.    She got a unit PRBCs 2/11.  Hemoglobin and platelets trending up with Impella out.   Echo: EF 55% with lateral hypokinesis. Aortic valve could not be assessed with Impella present.  Echo (post-Impella): EF 50-55%, moderate MR, mild-moderate AS.   Objective:   Weight Range: 62.2 kg Body mass index is 24.29 kg/m.   Vital Signs:   Temp:  [97.8 F (36.6 C)-99 F (37.2 C)] 98.1 F (36.7 C) (02/14 0559) Pulse Rate:  [88-91] 91 (02/14 0559) Resp:  [16-22] 18 (02/14 0559) BP: (95-120)/(58-99) 120/99 (02/14 0559) SpO2:  [96 %-100 %] 97 % (02/14 0559) Weight:  [62.2 kg] 62.2 kg (02/14 0559) Last BM Date: 05/18/18  Weight change: Filed Weights   05/23/18 0450 05/24/18 0500 05/25/18 0559  Weight: 68.3 kg 65.9 kg 62.2 kg    Intake/Output:   Intake/Output Summary (Last 24 hours) at 05/25/2018 1016 Last data filed at 05/25/2018 0900 Gross per 24 hour  Intake 971.92 ml  Output 700 ml  Net 271.92 ml      Physical Exam   General: NAD Neck: No JVD, no thyromegaly or thyroid nodule.  Lungs: Clear to auscultation bilaterally with normal respiratory effort. CV: Nondisplaced PMI.  Heart regular S1/S2, no S3/S4, 2/6 SEM.  No peripheral edema.   Abdomen: Soft, nontender, no hepatosplenomegaly, no distention.  Skin: Intact without lesions or rashes.  Neurologic: Alert and oriented x 3.  Psych: Normal affect. Extremities: No clubbing or cyanosis.  HEENT: Normal.    Telemetry   NSR 80s (personally reviewed)  Labs    CBC Recent Labs    05/24/18 0507 05/25/18 0559  WBC 12.0* 10.8*  HGB 10.5* 10.5*  HCT  31.9* 33.0*  MCV 80.8 81.5  PLT 184 580   Basic Metabolic Panel Recent Labs    05/24/18 0507 05/25/18 0559  NA 135 135  K 2.9* 3.1*  CL 97* 97*  CO2 24 25  GLUCOSE 141* 167*  BUN 38* 45*  CREATININE 1.78* 1.61*  CALCIUM 8.3* 8.3*  MG 1.9  --    Liver Function Tests No results for input(s): AST, ALT, ALKPHOS, BILITOT, PROT, ALBUMIN in the last 72 hours. No results for input(s): LIPASE, AMYLASE in the last 72 hours. Cardiac Enzymes No results for input(s): CKTOTAL, CKMB, CKMBINDEX, TROPONINI in the last 72 hours.  BNP: BNP (last 3 results) Recent Labs    05/19/18 1850  BNP 870.3*    ProBNP (last 3 results) Recent Labs    01/17/18 1122  PROBNP 1,194*     D-Dimer No results for input(s): DDIMER in the last 72 hours. Hemoglobin A1C No results for input(s): HGBA1C in the last 72 hours. Fasting Lipid Panel No results for input(s): CHOL, HDL, LDLCALC, TRIG, CHOLHDL, LDLDIRECT in the last 72 hours. Thyroid Function Tests No results for input(s): TSH, T4TOTAL, T3FREE, THYROIDAB in the last 72 hours.  Invalid input(s): FREET3  Other results:   Imaging    No results found.   Medications:     Scheduled Medications: . aspirin  81 mg Oral Daily  .  atorvastatin  80 mg Oral q1800  . carvedilol  3.125 mg Oral BID WC  . Chlorhexidine Gluconate Cloth  6 each Topical Daily  . docusate sodium  100 mg Oral Daily  . furosemide  40 mg Oral Daily  . heparin injection (subcutaneous)  5,000 Units Subcutaneous Q8H  . insulin aspart  0-15 Units Subcutaneous TID WC  . insulin aspart  0-5 Units Subcutaneous QHS  . insulin detemir  10 Units Subcutaneous QHS  . mouth rinse  15 mL Mouth Rinse BID  . pantoprazole  40 mg Oral BID  . potassium chloride  40 mEq Oral Once  . sodium chloride flush  10-40 mL Intracatheter Q12H  . sodium chloride flush  3 mL Intravenous Q12H  . THROMBI-PAD  1 each Topical Once  . ticagrelor  90 mg Oral BID    Infusions: . sodium chloride  Stopped (05/20/18 0700)  . sodium chloride Stopped (05/22/18 1310)  . nitroGLYCERIN Stopped (05/19/18 1034)  . norepinephrine (LEVOPHED) Adult infusion Stopped (05/23/18 0706)    PRN Medications: sodium chloride, acetaminophen, diazepam, lip balm, ondansetron (ZOFRAN) IV, sodium chloride flush, sodium chloride flush    Patient Profile   [name] a 85 y.o.femalewith known CAD s/p NSTEMI in 2019 with cardiac cath showing critical stenosis of the distal LMextending into the proximal LAD and circumflexand chronic TOCof the RCAw/ collaterals, turned down for CABG due to advanced aged and poor functional capacity and later underwentatherectomy with Impella hemodynamic support andtheleft main bifurcation w/stenting in November 2019, presenting back with unstable angina and EKG c/w acute STEMI.   Assessment/Plan   1. CAD: STEMI with 95+% left main, now s/p PCI to left main with Impella support.  She has known chronic occlusion of RCA.  Currently, no chest pain.  Troponin peaked 60.  - Continue ASA 81 and ticagrelor.  - Continue atorvastatin 80 mg daily.  2. Cardiogenic shock: In setting of acute MI.  Echo this admission showed EF down from normal range on most recent echo to 20%.  Ischemic cardiomyopathy.  Impella placed in cath lab.  Repeat echo on 2/12 showed EF up to 50-55% with lateral hypokinesis.  Impella removed 2/11 and heparin + milrinone stopped.  Now off norepinephrine. Creatinine down to 1.6, she is not volume overloaded on exam.  No dyspnea.    - Continue Lasix 40 mg po daily and add KCl 20 daily.  - Will add low dose Coreg 3.125 mg bid.    3. Aortic stenosis: Mild to moderate on echo 2/12.  4. Type II DM: SSI 5. Hemolysis: Impella-associated.  Got 1 unit PRBCs 2/11.  Resolved with Impella out.    6. AKI: Creatinine lower at 1.61.  7. Anemia: Suspect this was Impella-related with hemolysis noted. 8. Thrombocytopenia: Mild, suspect related to Impella hemolysis.  Resolved.  9. NSVT: Off milrinone and NE has resolved.  Now off amiodarone.   10. ID: Afebrile, WBCs coming down, blood cultures NGTD.  CXR w/o definite PNA. Respiratory virus panel was negative.   - Stop cefepime.    11. Disposition: She can go home today with home PT and 24 hour family presence.  She will followup in 10 days with Dr. Burt Knack.  Meds for home: Coreg 3.125 mg bid, ticagrelor 90 bid, ASA 81 daily, atorvastatin 80 mg daily, Lasix 40 daily, KCl 20 daily.   Length of Stay: 6  Loralie Champagne, MD  05/25/2018, 10:16 AM  Advanced Heart Failure Team Pager [phone] (M-F; 7a - 4p)  Please contact [facility] for night-coverage after hours (4p -7a ) and weekends on [email or web address]

## 2018-05-25 NOTE — Discharge Summary (Signed)
Advanced Heart Failure Discharge Note  Discharge Summary   Patient ID: [name] MRN: [id number], DOB/AGE: 06/19/1929 85 y.o. Admit date: 05/19/2018 D/C date:     05/25/2018   Primary Discharge Diagnoses:  1. CAD: STEMI with 95+% left main, now s/p PCI to left main with Impella support.  2. Cardiogenic shock In setting of acute MI.  3. Aortic stenosis: Mild to moderate on echo 2/12.  4. Type II DM: SSI 5. Hemolysis: Impella-associated.   6. AKI 7. Anemia 8. Thrombocytopenia 9. NSVT  Hospital Course:   [name] is a 85 y.o. female with known CAD s/p NSTEMI in 2019 with cardiac cath showing critical stenosis of the distal LMextending into the proximal LAD and circumflexand chronic TOCof the RCAw/ collaterals, turned down for CABG due to advanced aged and poor functional capacity and later underwentatherectomy with Impella hemodynamic support andtheleft main bifurcation w/stenting in November 2019, presenting to [facility] 05/19/2018 with unstable angina and EKG c/w acute STEMI.   In the cath lab, she was found to have recurrent critical left main stenosis (in-stent restenosis), 95+%.  RCA was chronically occluded.  She had Impella placement followed by successful PCI to left main.  Unable to place Swan catheter post-PCI, patient currently only has a femoral central line.  VT during case, defibrillation x 2.   Impella weaned as tolerated. EF showed recovery by Echo 2/12 s/p revascularization. Course complicated by anemia and thrombocytopenia, both thought to be secondary to Impella and showed improvement once removed. She was transiently on milrinone and NE, both weaned as tolerated.   Problem based hospital course as below.   1. CAD: STEMI with 95+% left main, now s/p PCI to left main with Impella support. She has known chronic occlusion of RCA. Currently, no chest pain.  Troponin peaked 60.  - Continue ASA 81 and ticagrelor.  - Continue atorvastatin 80 mg daily.  2.  Cardiogenic shock: In setting of acute MI. Echo this admission showed EF down from normal range on most recent echo to 20%. Ischemic cardiomyopathy. Impella placed in cath lab.  Repeat echo on 2/12 showed EF up to 50-55% with lateral hypokinesis.  Impella removed 2/11 and heparin + milrinone stopped.  Now off norepinephrine. Creatinine down to 1.6 on day of discharge without dyspnea.  3. Aortic stenosis: Mild to moderate on echo 2/12.  4. Type II DM: SSI. Per PCP moving forward 5. Hemolysis: Impella-associated.  Got 1 unit PRBCs 2/11.  Resolved with Impella out.    6. AKI: Creatinine lower at 1.61.  7. Anemia: Suspect this was Impella-related with hemolysis noted. 8. Thrombocytopenia: Mild, suspect related to Impella hemolysis. Resolved s/p impella removal.  9. NSVT: Weaned off milrinone and NE problem resolved.  Now off amiodarone.   10. ID: Afebrile, WBCs coming down, blood cultures NGTD.  CXR w/o definite PNA. Respiratory virus panel was negative.    Examined am of 05/25/18 and determined stable for home with HHPT and 24 hr family presence, and close follow up as below.   Discharge Weight Range: 137 lbs Discharge Vitals: Blood pressure 105/81, pulse 99, temperature 98.1 F (36.7 C), temperature source Oral, resp. rate 18, height 5\' 3"  (1.6 m), weight 62.2 kg, SpO2 97 %.  Labs: Lab Results  Component Value Date   WBC 10.8 (H) 05/25/2018   HGB 10.5 (L) 05/25/2018   HCT 33.0 (L) 05/25/2018   MCV 81.5 05/25/2018   PLT 209 05/25/2018    Recent Labs  Lab 05/20/18 0502  05/25/18 0559  NA 139   < > 135  K 4.3   < > 3.1*  CL 107   < > 97*  CO2 21*   < > 25  BUN 46*   < > 45*  CREATININE 1.91*   < > 1.61*  CALCIUM 8.4*   < > 8.3*  PROT 6.2*  --   --   BILITOT 1.4*  --   --   ALKPHOS 100  --   --   ALT 26  --   --   AST 218*  --   --   GLUCOSE 250*   < > 167*   < > = values in this interval not displayed.   Lab Results  Component Value Date   CHOL 83 05/21/2018   HDL 33 (L)  05/21/2018   LDLCALC 33 05/21/2018   TRIG 85 05/21/2018   BNP (last 3 results) Recent Labs    05/19/18 1850  BNP 870.3*    ProBNP (last 3 results) Recent Labs    01/17/18 1122  PROBNP 1,194*     Diagnostic Studies/Procedures   Echo 05/23/2018 - LVEF 50-55%, Normal RV, Mod MR, Trivial AI, Mild/Mod AS.   Echo 05/19/2018 - LVEF 20% (Impella in place)  [facility] 05/19/18  Ost LM to Dist LM lesion is 95% stenosed.  Ost Cx to Prox Cx lesion is 50% stenosed.  Ost LAD lesion is 80% stenosed.  Balloon angioplasty was performed.  Post intervention, there is a 10% residual stenosis.  Balloon angioplasty was performed.  Post intervention, there is a 15% residual stenosis.  Post intervention, there is a 10% residual stenosis.  Prox LAD to Mid LAD lesion is 75% stenosed.  Prox LAD lesion is 60% stenosed.  Ost RCA to Mid RCA lesion is 100% stenosed.   Severe native coronary obstructive disease with 95% in-stent restenosis of the left main with previous culotte stenting of the LAD/ circumflex in this patient with known total proximal RCA occlusion.  Impella hemodynamic support prior to PCI insertion of an Impella CR unit providing 3.5 L/min of cardiac output.  Difficult but successful PTCA of the left main into the ostium/proximal  circumflex and PTCA of the ostium of the LAD ultimately with kissing balloon technique with the left main and circumflex stenosis reduced to approximately 10% and the LAD 80% stenosis which progressed after the initial left main into circumflex dilatation improved to 20%.  At the completion of the procedure there was brisk TIMI-3 flow down both the LAD and left circumflex vessel with brisk collateralization to the distal RCA extending to the mid RCA region.  Sustained supraventricular tachycardia associated with initial progressive hypotension down to 55 mm Hg requiring emergent countershock during placement of the right heart catheter and subsequent  emergent countershock with later recurrence of SVT.  Moderate right heart pressure elevation with RV systolic pressure at 54 mmHg.  Levophed pressor support.  Bivalirudin/Brilinta loading for antiplatelet therapy.  RECOMMENDATION: DAPT indefinitely.  Follow-up echo Doppler study to reassess systolic function and confirmation of Impella placement.  Continue P9 support today, wean Levophed as tolerated with ultimate plans to hopefully remove Impella in the next 24 to 48 hours to reduce potential ischemic limb risk.  Discharge Medications   Allergies as of 05/25/2018      Reactions   Metformin Other (See Comments)   unspecified   Penicillins    unspecified Has patient had a PCN reaction causing immediate rash, facial/tongue/throat swelling, SOB or lightheadedness with  hypotension: Unknown Has patient had a PCN reaction causing severe rash involving mucus membranes or skin necrosis: Unknown Has patient had a PCN reaction that required hospitalization: No Has patient had a PCN reaction occurring within the last 10 years: No If all of the above answers are "NO", then may proceed with Cephalosporin use.   Codeine Palpitations   Rapid heart rate   Fenofibrate Palpitations   Pioglitazone Rash      Medication List    STOP taking these medications   clopidogrel 75 MG tablet Commonly known as:  PLAVIX   losartan 25 MG tablet Commonly known as:  COZAAR     TAKE these medications   acetaminophen 500 MG tablet Commonly known as:  TYLENOL Take 1,000 mg by mouth at bedtime as needed for moderate pain.   aspirin 81 MG tablet Take 81 mg by mouth daily.   atorvastatin 80 MG tablet Commonly known as:  LIPITOR Take 1 tablet (80 mg total) by mouth daily at 6 PM. What changed:    medication strength  See the new instructions.   carvedilol 3.125 MG tablet Commonly known as:  COREG Take 1 tablet (3.125 mg total) by mouth 2 (two) times daily with a meal. What changed:     medication strength  how much to take   furosemide 40 MG tablet Commonly known as:  LASIX Take 1 tablet (40 mg total) by mouth daily.   insulin NPH Human 100 UNIT/ML injection Commonly known as:  HUMULIN N,NOVOLIN N Inject 15 Units into the skin at bedtime.   nitroGLYCERIN 0.4 MG SL tablet Commonly known as:  NITROSTAT Place 1 tablet (0.4 mg total) under the tongue every 5 (five) minutes as needed.   NOVOLOG FLEXPEN 100 UNIT/ML FlexPen Generic drug:  insulin aspart INJECT 10 UNITS SUBCUTANEOUSLY THREE TIMES DAILY WITH MEALS What changed:  See the new instructions.   ONE TOUCH ULTRA TEST test strip Generic drug:  glucose blood USE 1 STRIP TO CHECK GLUCOSE 4 TIMES DAILY   pantoprazole 40 MG tablet Commonly known as:  PROTONIX Take 1 tablet (40 mg total) by mouth 2 (two) times daily.   potassium chloride 10 MEQ tablet Commonly known as:  K-DUR Take 2 tablets (20 mEq total) by mouth daily. What changed:  how much to take   [facility] OP Place 1 drop into both eyes daily as needed (dry eyes).   ticagrelor 90 MG Tabs tablet Commonly known as:  BRILINTA Take 1 tablet (90 mg total) by mouth 2 (two) times daily.            Durable Medical Equipment  (From admission, onward)         Start     Ordered   05/24/18 1622  Heart failure home health orders  (Heart failure home health orders / Face to face)  Once    Comments:  Heart Failure Follow-up Care:  Verify follow-up appointments per Patient Discharge Instructions. Confirm transportation arranged. Reconcile home medications with discharge medication list. Remove discontinued medications from use. Assist patient/caregiver to manage medications using pill box. Reinforce low sodium food selection Assessments: Vital signs and oxygen saturation at each visit. Assess home environment for safety concerns, caregiver support and availability of low-sodium foods. Consult Education officer, museum, PT/OT, Dietitian, and CNA based on  assessments. Perform comprehensive cardiopulmonary assessment. Notify MD for any change in condition or weight gain of 3 pounds in one day or 5 pounds in one week with symptoms. Daily Weights and Symptom Monitoring: Ensure  patient has access to scales. Teach patient/caregiver to weigh daily before breakfast and after voiding using same scale and record.    Teach patient/caregiver to track weight and symptoms and when to notify Provider. Activity: Develop individualized activity plan with patient/caregiver.  HHRN 2 wk 2 for CP assessment   If you want OT or PT at [facility], add order:PT 2 wk 2 for CP Rehab. Needs HHOT HHPT  Question Answer Comment  Heart Failure Follow-up Care [facility] ([facility] at [phone]   Lab frequency Other see comments   Fax lab results to [facility]HF Clinic at [phone]   Diet Low Sodium Heart Healthy   Fluid restrictions: 2000 mL Fluid      05/24/18 1622          Disposition   The patient will be discharged in stable condition to home. Discharge Instructions    (HEART FAILURE PATIENTS) Call MD:  Anytime you have any of the following symptoms: 1) 3 pound weight gain in 24 hours or 5 pounds in 1 week 2) shortness of breath, with or without a dry hacking cough 3) swelling in the hands, feet or stomach 4) if you have to sleep on extra pillows at night in order to breathe.   Complete by:  As directed    Amb Referral to Cardiac Rehabilitation   Complete by:  As directed    Diagnosis:   STEMI PTCA     Call MD for:  redness, tenderness, or signs of infection (pain, swelling, redness, odor or green/yellow discharge around incision site)   Complete by:  As directed    Diet - low sodium heart healthy   Complete by:  As directed    Heart Failure patients record your daily weight using the same scale at the same time of day   Complete by:  As directed    Increase activity slowly   Complete by:  As directed    STOP any activity that causes chest pain,  shortness of breath, dizziness, sweating, or exessive weakness   Complete by:  As directed      Follow-up Information    Richardson Dopp T, PA-C Follow up on 06/06/2018.   Specialties:  Cardiology, Physician Assistant Why:  2:15 pm. Hospital follow up Contact information: [address] [place] [address] [phone]             Duration of Discharge Encounter: Greater than 35 minutes   Signed, Annamaria Helling 05/25/2018, 1:37 PM

## 2018-05-25 NOTE — Progress Notes (Signed)
Discussed Brilinta, NTG, mobility with pt and daughter. Voiced understanding but denying HH. Reminded pt and daughter of severity of falling. Gila, ACSM 12:04 PM 05/25/2018

## 2018-05-26 LAB — CULTURE, BLOOD (ROUTINE X 2)
CULTURE: NO GROWTH
Culture: NO GROWTH
Special Requests: ADEQUATE
Special Requests: ADEQUATE

## 2018-06-01 ENCOUNTER — Telehealth (HOSPITAL_COMMUNITY): Payer: Self-pay

## 2018-06-01 NOTE — Telephone Encounter (Signed)
Pt daughter stated that pt would not like to participate in the Cardiac rehab program. Closed referral. Tedra [place]. Support Rep II

## 2018-06-06 ENCOUNTER — Ambulatory Visit (INDEPENDENT_AMBULATORY_CARE_PROVIDER_SITE_OTHER): Payer: PPO | Admitting: Physician Assistant

## 2018-06-06 ENCOUNTER — Encounter: Payer: Self-pay | Admitting: Physician Assistant

## 2018-06-06 VITALS — BP 100/48 | HR 64 | Ht 63.0 in | Wt 139.0 lb

## 2018-06-06 DIAGNOSIS — E782 Mixed hyperlipidemia: Secondary | ICD-10-CM

## 2018-06-06 DIAGNOSIS — N179 Acute kidney failure, unspecified: Secondary | ICD-10-CM | POA: Diagnosis not present

## 2018-06-06 DIAGNOSIS — I5042 Chronic combined systolic (congestive) and diastolic (congestive) heart failure: Secondary | ICD-10-CM | POA: Diagnosis not present

## 2018-06-06 DIAGNOSIS — I2101 ST elevation (STEMI) myocardial infarction involving left main coronary artery: Secondary | ICD-10-CM | POA: Diagnosis not present

## 2018-06-06 DIAGNOSIS — I350 Nonrheumatic aortic (valve) stenosis: Secondary | ICD-10-CM

## 2018-06-06 MED ORDER — POTASSIUM CHLORIDE ER 10 MEQ PO TBCR: 10.0000 meq | EXTENDED_RELEASE_TABLET | Freq: Every day | ORAL | 11 refills | Status: DC

## 2018-06-06 MED ORDER — FUROSEMIDE 40 MG PO TABS: 20.0000 mg | ORAL_TABLET | Freq: Every day | ORAL | 3 refills | Status: DC

## 2018-06-06 NOTE — Patient Instructions (Addendum)
Medication Instructions:  Your physician has recommended you make the following change in your medication:  1. DECREASE LASIX TO 20 MG DAILY  2. DECREASE POTASSIUM TO 10 MEQ DAILY.   If you need a refill on your cardiac medications before your next appointment, please call your pharmacy.   Lab work: TODAY: BMET  If you have labs (blood work) drawn today and your tests are completely normal, you will receive your results only by: Marland Kitchen MyChart Message (if you have MyChart) OR . A paper copy in the mail If you have any lab test that is abnormal or we need to change your treatment, we will call you to review the results.  Testing/Procedures: NONE  Follow-Up: . Your physician recommends that you keep your scheduled  follow-up appointment with Richardson Dopp, PA-C ON 08/01/18   Any Other Special Instructions Will Be Listed Below (If Applicable).

## 2018-06-06 NOTE — Progress Notes (Signed)
Cardiology Office Note:    Date:  06/06/2018   ID:  NACOLE FLUHR, DOB March 30, 1930, MRN [id number]  PCP:  Jinny Sanders, MD  Cardiologist:  Sherren Mocha, MD   Electrophysiologist:  None   Referring MD: Jinny Sanders, MD   Chief Complaint  Patient presents with  . Hospitalization Follow-up    s/p MI c/b CGS >> PCI; Impella device     History of Present Illness:    MICAL BRUN is a 85 y.o. female with coronary artery disease, systolic HF.  She had a NSTEMI in 2019 during an admission for a hip fracture.  She was managed medically at that time.  An echo 1 year later demonstrated improved LVF.  A Cardiac Catheterization demonstrated critical LM stenosis and CTO of the RCA.  She was not a candidate for CABG due to age and poor functional capacity.  She underwent atherectomy with Impella hemodynamic support and LM bifurcation stenting in 02/2018.  She was last seen by Dr. Burt Knack in 04/2018.     She was admitted 2/8-2/14 with an ST elevation MI complicated by cardiogenic shock.  LHC demonstrated 95% LM in stent restenosis.  She underwent difficult but successful angioplasty of the left main into the ostium/proximal LCx, angioplasty of the ostium of the LAD with kissing balloon technique.  Her procedure was complicated by sustained supraventricular tachycardia complicated by hypotension, requiring electrical cardioversion.  She required vasopressor support as well as hemodynamic support with Impella device.  EF was initially 20%.  She was followed by the advanced heart failure team.  Hospital course was complicated by anemia thought to be related to hemolysis from the Impella device.  She was transfused with 1 unit PRBCs.  She also developed acute kidney injury which improved prior to discharge.  Echocardiogram prior to discharge also demonstrated improved LV function with an EF of 50-55%.  She was also noted to have moderate mitral regurgitation and mild to moderate aortic stenosis.  She was  switched from clopidogrel to Ticagrelor.   Ms. Meckes returns for follow up.  She is here with her daughter.  She has not had any chest pain.  She has been short of breath but this is improved.  She denies paroxysmal nocturnal dyspnea, orthopnea, syncope, bleeding issues.  She notes some edema in her ankles on one occasion, but no recurrence.    Prior CV studies:   The following studies were reviewed today:  Echocardiogram 05/23/2018 EF 50-55, moderate LVH, diastolic dysfunction, mild MAC, moderate MR, trivial AI, mild to moderate aortic stenosis (mean 19)  Limited echocardiogram 05/21/2018 EF 55-60, mild LVH,, mild MAC, mild AI  Echocardiogram 05/19/2018 EF 20, diffuse HK, Impella catheter noted in LV, moderate MR, mild AI, PASP 48  Cardiac catheterization 05/19/2018 LM ostial 95 ISR LAD ostial 80, proximal 60, mid 75 LCx ostial 50 RCA ostial 100 with left-to-right collaterals PCI: Balloon angioplasty to the ostial left main, ostial LAD and ostial LCx        Past Medical History:  Diagnosis Date  . Anxiety   . Aortic stenosis 02/08/2018   Mod by echo 05/2018: Mean 19 mmHg  . CAD (coronary artery disease)    NSTEMI in 2019 tx medically // [place] in 11/19: critical LM stenosis; CTO of the RCA >> DES to LM/LAD and DES to oLCx ([place] technique) // s/p STEMI 05/2018 2/2 stent thrombosis c/b CGS >> POBA to LM/LAD and LCx (changed from Plavix to Brilinta); supported with Impella  .  Chronic combined systolic and diastolic heart failure ([place]) 02/07/2018   Echo 05/2018: EF 20 // repeat Echo (post PCI) 05/23/2018: EF 50-55, moderate LVH, diastolic dysfunction, mild MAC, moderate MR, trivial AI, mild to moderate aortic stenosis (mean 19)  . Diaphragmatic hernia without mention of obstruction or gangrene   . Diverticulosis of colon (without mention of hemorrhage) 2009  . Fibromyalgia   . GERD (gastroesophageal reflux disease)   . Hiatal hernia 2009   s/p nissen fundoplication 6962.    Marland Kitchen Hx of  adenomatous colonic polyps 2007, 2009   Adenomatous polyps in 2007 and 2009.  Hyperplastic polyps in 2009  . Hyperlipidemia   . IBS (irritable bowel syndrome)   . Internal hemorrhoids without mention of complication 9528   Seen at colonoscopy.  . Thyroid disease   . Type II or unspecified type diabetes mellitus with neurological manifestations, not stated as uncontrolled(250.60)    Surgical Hx: The patient  has a past surgical history that includes Thyroidectomy, partial; Abdominal hysterectomy; bmd (2007); Esophagogastroduodenoscopy (2007); Laparoscopic Nissen fundoplication (4132); Cataract extraction; Hernia repair; Excision basal cell carcinoma (04/2016); Intramedullary (im) nail intertrochanteric (Right, 03/01/2017); ORIF wrist fracture (Right, 03/01/2017); RIGHT/LEFT HEART CATH AND CORONARY ANGIOGRAPHY (N/A, 02/07/2018); CORONARY ATHERECTOMY (N/A, 02/13/2018); VENTRICULAR ASSIST DEVICE INSERTION (N/A, 02/13/2018); VENTRICULAR ASSIST DEVICE INSERTION (N/A, 05/19/2018); CORONARY BALLOON ANGIOPLASTY (N/A, 05/19/2018); RIGHT/LEFT HEART CATH AND CORONARY ANGIOGRAPHY (N/A, 05/19/2018); and CARDIOVERSION (N/A, 05/19/2018).   Current Medications: Current Meds  Medication Sig  . acetaminophen (TYLENOL) 500 MG tablet Take 1,000 mg by mouth at bedtime as needed for moderate pain.   Marland Kitchen aspirin 81 MG tablet Take 81 mg by mouth daily.    Marland Kitchen atorvastatin (LIPITOR) 80 MG tablet Take 1 tablet (80 mg total) by mouth daily at 6 PM.  . Carboxymethylcellulose Sodium (THERATEARS OP) Place 1 drop into both eyes daily as needed (dry eyes).  . carvedilol (COREG) 3.125 MG tablet Take 1 tablet (3.125 mg total) by mouth 2 (two) times daily with a meal.  . furosemide (LASIX) 40 MG tablet Take 0.5 tablets (20 mg total) by mouth daily.  . insulin NPH Human (HUMULIN N,NOVOLIN N) 100 UNIT/ML injection Inject 15 Units into the skin at bedtime.   . nitroGLYCERIN (NITROSTAT) 0.4 MG SL tablet Place 1 tablet (0.4 mg total) under the  tongue every 5 (five) minutes as needed.  . pantoprazole (PROTONIX) 40 MG tablet Take 1 tablet (40 mg total) by mouth 2 (two) times daily.  . potassium chloride (K-DUR) 10 MEQ tablet Take 1 tablet (10 mEq total) by mouth daily.  . ticagrelor (BRILINTA) 90 MG TABS tablet Take 1 tablet (90 mg total) by mouth 2 (two) times daily.  . [DISCONTINUED] furosemide (LASIX) 40 MG tablet Take 1 tablet (40 mg total) by mouth daily.  . [DISCONTINUED] potassium chloride (K-DUR) 10 MEQ tablet Take 2 tablets (20 mEq total) by mouth daily.     Allergies:   Metformin; Penicillins; Codeine; Fenofibrate; and Pioglitazone   Social History   Tobacco Use  . Smoking status: Former Smoker    Packs/day: 0.25    Years: 4.00    Pack years: 1.00    Types: Cigarettes    Last attempt to quit: 03/10/1981    Years since quitting: 37.2  . Smokeless tobacco: Never Used  Substance Use Topics  . Alcohol use: No    Alcohol/week: 0.0 standard drinks  . Drug use: No     Family Hx: The patient's family history includes Breast cancer in her paternal  aunt; Cancer in her mother; Diabetes in her maternal grandfather and son; Heart disease in her paternal grandmother; Hyperlipidemia in her son; Uterine cancer in her mother. There is no history of Colon cancer or Stomach cancer.  ROS:   Please see the history of present illness.    ROS All other systems reviewed and are negative.   EKGs/Labs/Other Test Reviewed:    EKG:  EKG is  ordered today.  The ekg ordered today demonstrates normal sinus rhythm, heart rate 64, specific ST-T wave changes, QTC 455  Recent Labs: 01/17/2018: NT-Pro BNP 1,194 05/19/2018: B Natriuretic Peptide 870.3 05/20/2018: ALT 26 05/24/2018: Magnesium 1.9 05/25/2018: BUN 45; Creatinine, Ser 1.61; Hemoglobin 10.5; Platelets 209; Potassium 3.1; Sodium 135   Recent Lipid Panel Lab Results  Component Value Date/Time   CHOL 83 05/21/2018 05:07 AM   TRIG 85 05/21/2018 05:07 AM   HDL 33 (L) 05/21/2018 05:07  AM   CHOLHDL 2.5 05/21/2018 05:07 AM   LDLCALC 33 05/21/2018 05:07 AM   LDLDIRECT 101.0 06/23/2016 11:57 AM    Physical Exam:    VS:  BP (!) 100/48   Pulse 64   Ht _0  (1.6 m)   Wt 139 lb (63 kg)   BMI 24.62 kg/m     Wt Readings from Last 3 Encounters:  06/06/18 139 lb (63 kg)  05/25/18 137 lb 2 oz (62.2 kg)  05/02/18 143 lb 1.9 oz (64.9 kg)     Physical Exam  Constitutional: She is oriented to person, place, and time. She appears well-developed and well-nourished. No distress.  HENT:  Head: Normocephalic and atraumatic.  Eyes: No scleral icterus.  Neck: No thyromegaly present.  Cardiovascular: Normal rate and regular rhythm.  Murmur heard.  Crescendo-decrescendo systolic murmur is present with a grade of 2/6 at the upper right sternal border. Pulmonary/Chest: Effort normal. She has no rales.  Abdominal: Soft.  Musculoskeletal:        General: No edema.     Comments: Bilateral groins without hematoma or bruit  Lymphadenopathy:    She has no cervical adenopathy.  Neurological: She is alert and oriented to person, place, and time.  Skin: Skin is warm and dry.  Psychiatric: She has a normal mood and affect.    ASSESSMENT & PLAN:    ST elevation myocardial infarction involving left main coronary artery ([place]) She is status post recent ST elevation myocardial infarction secondary to subtotally occluded left main.  This was retreated with balloon angioplasty.  Her procedure was complicated by cardiogenic shock.  She required electrical cardioversion for SVT, Impella device and vasopressors.  She was followed by the advanced heart failure team.  Her ejection fraction improved to normal prior to discharge from the hospital.  Since discharge, she has been doing well.  She is not interested in formal cardiac rehabilitation.  She prefers to increase activity on her own.  She is now on Ticagrelor in addition to aspirin, atorvastatin and carvedilol.  Continue current therapy.   Follow-up with me in April as planned.  Chronic combined systolic and diastolic heart failure (HCC) EF was 20% at the time of her myocardial infarction.  This improved to normal prior to discharge.  Blood pressure is too low to start ACE inhibitor or ARB.  Continue low-dose beta-blocker.  Her blood pressure is running somewhat low, therefore I will decrease her Lasix to 20 mg daily and her potassium to 10 mEq daily.  Obtain repeat BMET today.  Acute kidney injury Creatinine improved gradually  post MI.  Obtain repeat BMET today.  Nonrheumatic aortic valve stenosis Moderate aortic stenosis by echocardiogram.  Mixed hyperlipidemia Continue high-dose statin therapy.  Dispo:  Return in 8 weeks (on 08/01/2018) for Scheduled Follow Up, w/ Richardson Dopp, PA-C.   Medication Adjustments/Labs and Tests Ordered: Current medicines are reviewed at length with the patient today.  Concerns regarding medicines are outlined above.  Tests Ordered: Orders Placed This Encounter  Procedures  . Basic metabolic panel  . EKG 12-Lead   Medication Changes: Meds ordered this encounter  Medications  . potassium chloride (K-DUR) 10 MEQ tablet    Sig: Take 1 tablet (10 mEq total) by mouth daily.    Dispense:  60 tablet    Refill:  11  . furosemide (LASIX) 40 MG tablet    Sig: Take 0.5 tablets (20 mg total) by mouth daily.    Dispense:  45 tablet    Refill:  3    Signed, Richardson Dopp, PA-C  06/06/2018 4:33 PM    [facility] [place], [address], [place]  [address] Phone: [phone]; Fax: [phone]

## 2018-06-07 ENCOUNTER — Encounter (HOSPITAL_COMMUNITY): Payer: PPO | Admitting: Cardiology

## 2018-06-07 LAB — BASIC METABOLIC PANEL
BUN / CREAT RATIO: 29 — AB (ref 12–28)
BUN: 27 mg/dL (ref 8–27)
CO2: 23 mmol/L (ref 20–29)
Calcium: 8.7 mg/dL (ref 8.7–10.3)
Chloride: 101 mmol/L (ref 96–106)
Creatinine, Ser: 0.93 mg/dL (ref 0.57–1.00)
GFR calc Af Amer: 64 mL/min/{1.73_m2} (ref >59)
GFR calc non Af Amer: 56 mL/min/{1.73_m2} — ABNORMAL LOW (ref >59)
Glucose: 65 mg/dL (ref 65–99)
Potassium: 4.4 mmol/L (ref 3.5–5.2)
Sodium: 137 mmol/L (ref 134–144)

## 2018-06-11 ENCOUNTER — Telehealth: Payer: Self-pay | Admitting: Cardiovascular Disease

## 2018-06-11 NOTE — Telephone Encounter (Signed)
New Message   Pt c/o BP issue: STAT if pt c/o blurred vision, one-sided weakness or slurred speech  1. What are your last 5 BP readings? Today was 121/61 patient hasn't taken anymore  2. Are you having any other symptoms (ex. Dizziness, headache, blurred vision, passed out)? Fatigue  3. What is your BP issue? Patient's blood pressure is low and the patient wants to know what they should do.

## 2018-06-12 NOTE — Telephone Encounter (Signed)
SPOKE WITH DAUGHTER ABOUT BLOOD PRESSURE READING WHICH WAS IN THE RANGES OF 120'S OVER 60'S PER SCOTT IS  CONSIDERED NOT LOW.  PT DTR MENTIONED PT WAS HAVING SOME DIZZY SPELLS AND FEELING FATIGUE  W/O C/O OF CHEST PAIN BUT WENT AWAY AFTER SHE SAT DOWN . STATED PT HAS A HISTORY OF FALLING BUT SINCE IN HER CARE SHE TRY TO MAKE SURE  SHE'S MONITORED.  PT DTR WAS INSTRUCTED TO MONITOR SYMPTOMS AND TO CONTACT EMS IF PT HAS ANY CHEST PAINS OF FALLS.

## 2018-06-17 LAB — ECHOCARDIOGRAM LIMITED
Height: 63 in
Weight: 2416.24 oz

## 2018-06-26 ENCOUNTER — Other Ambulatory Visit: Payer: Self-pay | Admitting: *Deleted

## 2018-06-26 ENCOUNTER — Encounter: Payer: Self-pay | Admitting: *Deleted

## 2018-06-26 NOTE — Patient Outreach (Signed)
Telephone call for HTA High Risk patient. Mrs. Chappelle had screening last year and pt and daughter declined services.  Today I spoke with [name], pt daughter. I re-introduced our care management services and Mrs. Smithey, declined services says she can provide all necessary care for her mother. She did express her appreciation for the call.   I will send a successful letter outreach.  Eulah Pont. Myrtie Neither, MSN, [facility] Gerontological Nurse Practitioner [facility] Care Management [phone]

## 2018-07-02 ENCOUNTER — Other Ambulatory Visit: Payer: Self-pay | Admitting: Family Medicine

## 2018-07-17 ENCOUNTER — Encounter (HOSPITAL_COMMUNITY): Payer: PPO

## 2018-07-17 ENCOUNTER — Ambulatory Visit: Payer: PPO | Admitting: Family

## 2018-07-30 ENCOUNTER — Other Ambulatory Visit (HOSPITAL_COMMUNITY): Payer: PPO

## 2018-08-01 ENCOUNTER — Ambulatory Visit: Payer: PPO | Admitting: Physician Assistant

## 2018-08-13 ENCOUNTER — Telehealth: Payer: Self-pay | Admitting: Cardiovascular Disease

## 2018-08-13 NOTE — Telephone Encounter (Signed)
  Pt c/o Shortness Of Breath: STAT if SOB developed within the last 24 hours or pt is noticeably SOB on the phone  1. Are you currently SOB (can you hear that pt is SOB on the phone)? no  2. How long have you been experiencing SOB? Last few weeks  3. Are you SOB when sitting or when up moving around? Mostly when moving around, sometimes when talking  4. Are you currently experiencing any other symptoms? No other symptoms

## 2018-08-15 NOTE — Telephone Encounter (Signed)
Increase Lasix to 40 mg once daily x 3 days, then resume 20 mg once daily. Arrange telemedicine visit - I can see her Friday (I have a few openings). She should weigh daily and record so we can review at her appt. Call if symptoms worsen or if she develops chest pain. Richardson Dopp, PA-C    08/15/2018 5:50 PM

## 2018-08-15 NOTE — Telephone Encounter (Signed)
SPOKE WITH PT WHO WANTED TO CALL IN  TO LET CLINIC KNOW SHE HAS HAD SOME EPISODE OF SOB  FOR PAST TWO WEEKS.  PT C/O OF SOB HAPPENING IF SHE WALKING AROUND TO MUCH SHE MIGHT HAVE TO CATCH A BREATH.  SHE USUALLY FEEL BETTER ONCE SHE SITS DOWN.  SHE PRACTICING SOCIAL DISTANCING TO NOT GET SICK. Marland Kitchen SHE STATES HE WANTED TO CALL AND LET [facility]INC KNOW  . PT DOESNT C/O OF ANY SWELLING IN ANY LIMBS OR CP   PT WAS TOLD SHE WOULD BE NOTIFIED WITH ANY RECOMMENDATIONS FROM INFORMATION GATHERED

## 2018-08-16 NOTE — Progress Notes (Signed)
Virtual Visit via Telephone Note   This visit type was conducted due to national recommendations for restrictions regarding the COVID-19 Pandemic (e.g. social distancing) in an effort to limit this patient's exposure and mitigate transmission in our community.  Due to her co-morbid illnesses, this patient is at least at moderate risk for complications without adequate follow up.  This format is felt to be most appropriate for this patient at this time.  The patient did not have access to video technology/had technical difficulties with video requiring transitioning to audio format only (telephone).  All issues noted in this document were discussed and addressed.  No physical exam could be performed with this format.  Please refer to the patient's chart for her  consent to telehealth for [facility].   Date:  08/17/2018   ID:  [name], DOB August 05, 1929, MRN [id number]  Patient Location: Home Provider Location: Home  PCP:  Jinny Sanders, MD  Cardiologist:  Sherren Mocha, MD   Electrophysiologist:  None   Evaluation Performed:  Follow-Up Visit  Chief Complaint: Shortness of breath  History of Present Illness:    [name] is a 85 y.o. female with coronary artery disease, systolic HF.  She had a NSTEMI in 2019 during an admission for a hip fracture.  She was managed medically at that time.  An echo 1 year later demonstrated improved LVF.  A Cardiac Catheterization demonstrated critical LM stenosis and CTO of the RCA.  She was not a candidate for CABG due to age and poor functional capacity.  She underwent atherectomy with Impella hemodynamic support and LM bifurcation stenting in 02/2018.  She was then admitted in 05/2018 with an STEMI c/b cardiogenic shock 2/2 LM in stent restenosis.  coronary artery disease, systolic HF.  She had a NSTEMI in 2019 during an admission for a hip fracture.  She was managed medically at that time.  An echo 1 year later demonstrated improved LVF.  A  Cardiac Catheterization demonstrated critical LM stenosis and CTO of the RCA.  She was not a candidate for CABG due to age and poor functional capacity.  She underwent atherectomy with Impella hemodynamic support and LM bifurcation stenting in 02/2018.  EF was initially 20% but improved to 50-55% prior to DC.  She was last seen 06/06/2018.  She called in recently with complaints of shortness of breath.  I increased her Lasix for 3 days.  Today, she notes that she has been short of breath for the last 3 weeks.  She describes having a new prescription for her furosemide.  It sounds as though she had been on 40 mg a day and her new prescription was for 20 mg.  In retrospect, she thinks that her breathing probably worsened around that time.  She has been short of breath with any activity.  She has noted orthopnea but has not had paroxysmal nocturnal dyspnea.  She has not really noted significant lower extremity swelling but has had some ankle edema.  She has not had any syncope, fever.  She has a chronic, dry nonproductive cough.  This is unchanged.  She has not had any chest discomfort reminiscent of her previous angina.  The patient does not have symptoms concerning for COVID-19 infection (fever, chills, cough, or new shortness of breath).    Past Medical History:  Diagnosis Date  . Anxiety   . Aortic stenosis 02/08/2018   Mod by echo 05/2018: Mean 19 mmHg  . CAD (coronary artery disease)  NSTEMI in 2019 tx medically // [place] in 11/19: critical LM stenosis; CTO of the RCA >> DES to LM/LAD and DES to oLCx ([place] technique) // s/p STEMI 05/2018 2/2 stent thrombosis c/b CGS >> POBA to LM/LAD and LCx (changed from Plavix to Brilinta); supported with Impella  . Chronic combined systolic and diastolic heart failure ([place]) 02/07/2018   Echo 05/2018: EF 20 // repeat Echo (post PCI) 05/23/2018: EF 50-55, moderate LVH, diastolic dysfunction, mild MAC, moderate MR, trivial AI, mild to moderate aortic stenosis (mean  19)  . Diaphragmatic hernia without mention of obstruction or gangrene   . Diverticulosis of colon (without mention of hemorrhage) 2009  . Fibromyalgia   . GERD (gastroesophageal reflux disease)   . Hiatal hernia 2009   s/p nissen fundoplication 0938.    Marland Kitchen Hx of adenomatous colonic polyps 2007, 2009   Adenomatous polyps in 2007 and 2009.  Hyperplastic polyps in 2009  . Hyperlipidemia   . IBS (irritable bowel syndrome)   . Internal hemorrhoids without mention of complication 1829   Seen at colonoscopy.  . Thyroid disease   . Type II or unspecified type diabetes mellitus with neurological manifestations, not stated as uncontrolled(250.60)    Past Surgical History:  Procedure Laterality Date  . ABDOMINAL HYSTERECTOMY    . BASAL CELL CARCINOMA EXCISION  04/2016   corner of left eye  . bmd  2007  . CARDIOVERSION N/A 05/19/2018   Procedure: CARDIOVERSION;  Surgeon: Troy Sine, MD;  Location: [place] [facility];  Service: Cardiovascular;  Laterality: N/A;  . CATARACT EXTRACTION    . CORONARY ATHERECTOMY N/A 02/13/2018   Procedure: CORONARY ATHERECTOMY;  Surgeon: Sherren Mocha, MD;  Location: [place] [facility];  Service: Cardiovascular;  Laterality: N/A;  . CORONARY BALLOON ANGIOPLASTY N/A 05/19/2018   Procedure: CORONARY BALLOON ANGIOPLASTY;  Surgeon: Troy Sine, MD;  Location: [place] [facility];  Service: Cardiovascular;  Laterality: N/A;  . ESOPHAGOGASTRODUODENOSCOPY  2007  . HERNIA REPAIR    . INTRAMEDULLARY (IM) NAIL INTERTROCHANTERIC Right 03/01/2017   Procedure: INTRAMEDULLARY (IM) NAIL INTERTROCHANTRIC;  Surgeon: Hiram Gash, MD;  Location: [place];  Service: Orthopedics;  Laterality: Right;  . LAPAROSCOPIC NISSEN FUNDOPLICATION  9371  . ORIF WRIST FRACTURE Right 03/01/2017   Procedure: OPEN REDUCTION INTERNAL FIXATION (ORIF) WRIST FRACTURE;  Surgeon: Hiram Gash, MD;  Location: [place];  Service: Orthopedics;  Laterality: Right;  . RIGHT/LEFT HEART CATH AND CORONARY  ANGIOGRAPHY N/A 02/07/2018   Procedure: RIGHT/LEFT HEART CATH AND CORONARY ANGIOGRAPHY;  Surgeon: Sherren Mocha, MD;  Location: [place] [facility];  Service: Cardiovascular;  Laterality: N/A;  . RIGHT/LEFT HEART CATH AND CORONARY ANGIOGRAPHY N/A 05/19/2018   Procedure: RIGHT/LEFT HEART CATH AND CORONARY ANGIOGRAPHY;  Surgeon: Troy Sine, MD;  Location: [place] [facility];  Service: Cardiovascular;  Laterality: N/A;  . THYROIDECTOMY, PARTIAL    . VENTRICULAR ASSIST DEVICE INSERTION N/A 02/13/2018   Procedure: VENTRICULAR ASSIST DEVICE INSERTION;  Surgeon: Sherren Mocha, MD;  Location: [place] [facility];  Service: Cardiovascular;  Laterality: N/A;  . VENTRICULAR ASSIST DEVICE INSERTION N/A 05/19/2018   Procedure: VENTRICULAR ASSIST DEVICE INSERTION;  Surgeon: Troy Sine, MD;  Location: [place] [facility];  Service: Cardiovascular;  Laterality: N/A;     Current Meds  Medication Sig  . acetaminophen (TYLENOL) 500 MG tablet Take 1,000 mg by mouth at bedtime as needed for moderate pain.   Marland Kitchen aspirin 81 MG tablet Take 81 mg by mouth daily.    Marland Kitchen  atorvastatin (LIPITOR) 40 MG tablet Take 40 mg by mouth daily.  . Carboxymethylcellulose Sodium (THERATEARS OP) Place 1 drop into both eyes daily as needed (dry eyes).  . carvedilol (COREG) 6.25 MG tablet Take 6.25 mg by mouth 2 (two) times daily with a meal.  . furosemide (LASIX) 40 MG tablet Patient taking 40 mg tablet by mouth on Thursday, Friday and Saturday  . Insulin Aspart FlexPen 100 UNIT/ML SOPN Inject 10 Units into the skin 3 (three) times daily with meals.  . insulin NPH Human (HUMULIN N,NOVOLIN N) 100 UNIT/ML injection Inject 15 Units into the skin at bedtime.   . nitroGLYCERIN (NITROSTAT) 0.4 MG SL tablet Place 1 tablet (0.4 mg total) under the tongue every 5 (five) minutes as needed.  . ONE TOUCH ULTRA TEST test strip USE 1 STRIP TO CHECK GLUCOSE 4 TIMES DAILY  . pantoprazole (PROTONIX) 40 MG tablet Take 1 tablet (40 mg total) by mouth 2  (two) times daily.  . potassium chloride (K-DUR) 10 MEQ tablet Take 1 tablet (10 mEq total) by mouth daily.  . ticagrelor (BRILINTA) 90 MG TABS tablet Take 1 tablet (90 mg total) by mouth 2 (two) times daily.  . [DISCONTINUED] furosemide (LASIX) 40 MG tablet Take 0.5 tablets (20 mg total) by mouth daily. (Patient taking differently: Take 40 mg by mouth daily. )     Allergies:   Metformin; Penicillins; Codeine; Fenofibrate; and Pioglitazone   Social History   Tobacco Use  . Smoking status: Former Smoker    Packs/day: 0.25    Years: 4.00    Pack years: 1.00    Types: Cigarettes    Last attempt to quit: 03/10/1981    Years since quitting: 37.4  . Smokeless tobacco: Never Used  Substance Use Topics  . Alcohol use: No    Alcohol/week: 0.0 standard drinks  . Drug use: No     Family Hx: The patient's family history includes Breast cancer in her paternal aunt; Cancer in her mother; Diabetes in her maternal grandfather and son; Heart disease in her paternal grandmother; Hyperlipidemia in her son; Uterine cancer in her mother. There is no history of Colon cancer or Stomach cancer.  ROS:   Please see the history of present illness.    All other systems reviewed and are negative.   Prior CV studies:   The following studies were reviewed today:  Echocardiogram 05/23/2018 EF 50-55, moderate LVH, diastolic dysfunction, mild MAC, moderate MR, trivial AI, mild to moderate aortic stenosis (mean 19)   Limited echocardiogram 05/21/2018 EF 55-60, mild LVH,, mild MAC, mild AI   Echocardiogram 05/19/2018 EF 20, diffuse HK, Impella catheter noted in LV, moderate MR, mild AI, PASP 48   Cardiac catheterization 05/19/2018 LM ostial 95 ISR LAD ostial 80, proximal 60, mid 75 LCx ostial 50 RCA ostial 100 with left-to-right collaterals PCI: Balloon angioplasty to the ostial left main, ostial LAD and ostial LCx         Labs/Other Tests and Data Reviewed:    EKG:  No ECG reviewed.  Recent Labs:  01/17/2018: NT-Pro BNP 1,194 05/19/2018: B Natriuretic Peptide 870.3 05/20/2018: ALT 26 05/24/2018: Magnesium 1.9 05/25/2018: Hemoglobin 10.5; Platelets 209 06/06/2018: BUN 27; Creatinine, Ser 0.93; Potassium 4.4; Sodium 137   Recent Lipid Panel Lab Results  Component Value Date/Time   CHOL 83 05/21/2018 05:07 AM   TRIG 85 05/21/2018 05:07 AM   HDL 33 (L) 05/21/2018 05:07 AM   CHOLHDL 2.5 05/21/2018 05:07 AM   LDLCALC 33 05/21/2018 05:07  AM   LDLDIRECT 101.0 06/23/2016 11:57 AM    Wt Readings from Last 3 Encounters:  08/17/18 136 lb 12.8 oz (62.1 kg)  06/06/18 139 lb (63 kg)  05/25/18 137 lb 2 oz (62.2 kg)     Objective:    Vital Signs:  BP 119/65   Pulse 76   Ht _0  (1.6 m)   Wt 136 lb 12.8 oz (62.1 kg)   BMI 24.23 kg/m    VITAL SIGNS:  reviewed GEN:  no acute distress RESPIRATORY:  No labored breathing noted NEURO:  Alert and oriented PSYCH:  She seems to be in good spirits  ASSESSMENT & PLAN:    Shortness of breath Due to the limitations of telehealth technology and the fact that we were only able to connect via telephone today, it is difficult to determine the exact etiology of her shortness of breath.  However, given her history, I suspect that she is somewhat volume overloaded.  She seems to have been stable on a higher dose of furosemide which was reduced recently.  I have asked her to remain on furosemide 40 mg daily.  I have asked her to increase her potassium to 20 mEq daily.  I will arrange a home visit next week to do an exam and obtain some labs and then have her follow-up with me later in the week.  She knows that she does feel somewhat better over the last few days.  I have advised her to call 911 if she develops shortness of breath at rest or recurrent anginal symptoms.  -Remote health home visit next week: BMET, CBC, BNP, LFTs  -Continue Lasix 40 mg daily  -Increase potassium to 20 mcg daily  -Follow-up with telehealth visit with me next week  Chronic  combined systolic and diastolic heart failure (HCC) Previous EF was 20% at the time of her myocardial infarction but improved to normal prior to discharge she was due to have a follow-up echocardiogram last month but this was canceled due to COVID-19.  As noted, I suspect her shortness of breath is related to volume excess.  She is NYHA 3.  Continue Lasix 40 mg daily along with potassium 20 equivalents daily.  Continue carvedilol.  Low blood pressure has limited CHF medication titration.  Coronary artery disease involving native coronary artery of native heart without angina pectoris History of MI in 2019 originally managed medically.  She ultimately underwent complicated left main bifurcation stenting in November 2019.  She was admitted in February 2020 with acute MI complicated by cardiogenic shock related to stent thrombosis.  This was treated with angioplasty.  She is not really had any recurrent angina since her procedure in February.  She remains on aspirin, Brilinta, atorvastatin, carvedilol.  Nonrheumatic aortic valve stenosis Mild to moderate aortic stenosis with mean gradient 19 by echocardiogram in February 2020.  She was due for follow-up echo last month but this was canceled secondary to COVID-19.  The echo last month was scheduled back in January before her heart attack.  Technically, she probably does not need a follow-up echo.  However, with her worsening shortness of breath, we should consider this.  If her breathing is not returned to baseline by her follow-up with me next week, I will arrange a follow-up echo at some point in the next couple of weeks in the office, if possible.  Hyperlipidemia, unspecified hyperlipidemia type Continue statin therapy.  Educated About Covid-19 Virus Infection The signs and symptoms of COVID-19 were discussed with  the patient and how to seek care for testing (follow up with PCP or arrange E-visit).  The importance of social distancing was discussed  today.  Time:   Today, I have spent 22 minutes with the patient with telehealth technology discussing the above problems.     Medication Adjustments/Labs and Tests Ordered: Current medicines are reviewed at length with the patient today.  Concerns regarding medicines are outlined above.   Tests Ordered: No orders of the defined types were placed in this encounter.   Medication Changes: No orders of the defined types were placed in this encounter.   Disposition:  Follow up in 1 week(s)  Signed, Richardson Dopp, PA-C  08/17/2018 12:58 PM    [facility]

## 2018-08-16 NOTE — Telephone Encounter (Signed)
Pt verbalized understanding of [employer] recommendations.. will take extra Lasix today for 3 days and will check her BP and weight today and tomorrow morning and will write them down..  She consents to a phone visit with Nicki Reaper 08/17/18 but not cell option... she requests we call her home  [phone].      Virtual Visit Pre-Appointment Phone Call  1. "What is the BEST phone number to call the day of the visit?" [phone]  2. "Do you have or have access to (through a family member/friend) a smartphone with video capability that we can use for your visit?" NO Phone only   3. Confirm consent - "In the setting of the current Covid19 crisis, you are scheduled for a (phone) visit with your provider on 08/17/18.  Just as we do with many in-office visits, in order for you to participate in this visit, we must obtain consent.  If you'd like, I can send this to your mychart (if signed up) or email for you to review.  Otherwise, I can obtain your verbal consent now.  All virtual visits are billed to your insurance company just like a normal visit would be.  By agreeing to a virtual visit, we'd like you to understand that the technology does not allow for your provider to perform an examination, and thus may limit your provider's ability to fully assess your condition. If your provider identifies any concerns that need to be evaluated in person, we will make arrangements to do so.  Finally, though the technology is pretty good, we cannot assure that it will always work on either your or our end, and in the setting of a video visit, we may have to convert it to a phone-only visit.  In either situation, we cannot ensure that we have a secure connection.  Are you willing to proceed?" STAFF: Did the patient verbally acknowledge consent to telehealth visit? Document YES/NO here: YES  4. Advise patient to be prepared - "Two hours prior to your appointment, go ahead and check your blood pressure, pulse, oxygen  saturation, and your weight (if you have the equipment to check those) and write them all down. When your visit starts, your provider will ask you for this information. If you have an Apple Watch or Kardia device, please plan to have heart rate information ready on the day of your appointment. Please have a pen and paper handy nearby the day of the visit as well."  5. Give patient instructions for MyChart download to smartphone OR Doximity/Doxy.me as below if video visit (depending on what platform provider is using)  6. Inform patient they will receive a phone call 15 minutes prior to their appointment time (may be from unknown caller ID) so they should be prepared to answer    TELEPHONE CALL NOTE  [name] has been deemed a candidate for a follow-up tele-health visit to limit community exposure during the Covid-19 pandemic. I spoke with the patient via phone to ensure availability of phone/video source, confirm preferred email & phone number, and discuss instructions and expectations.  I reminded [name] to be prepared with any vital sign and/or heart rhythm information that could potentially be obtained via home monitoring, at the time of her visit. I reminded [name] to expect a phone call prior to her visit.  Stephani Police, RN 08/16/2018 10:25 AM  FULL LENGTH CONSENT FOR TELE-HEALTH VISIT   I hereby voluntarily request, consent and authorize [facility] and  its employed or Journalist, newspaper, Engineer, materials, nurse practitioners or other licensed health care professionals (the Practitioner), to provide me with telemedicine health care services (the "Services") as deemed necessary by the treating Practitioner. I acknowledge and consent to receive the Services by the Practitioner via telemedicine. I understand that the telemedicine visit will involve communicating with the Practitioner through live audiovisual communication technology and the disclosure of certain  medical information by electronic transmission. I acknowledge that I have been given the opportunity to request an in-person assessment or other available alternative prior to the telemedicine visit and am voluntarily participating in the telemedicine visit.  I understand that I have the right to withhold or withdraw my consent to the use of telemedicine in the course of my care at any time, without affecting my right to future care or treatment, and that the Practitioner or I may terminate the telemedicine visit at any time. I understand that I have the right to inspect all information obtained and/or recorded in the course of the telemedicine visit and may receive copies of available information for a reasonable fee.  I understand that some of the potential risks of receiving the Services via telemedicine include:  Marland Kitchen Delay or interruption in medical evaluation due to technological equipment failure or disruption; . Information transmitted may not be sufficient (e.g. poor resolution of images) to allow for appropriate medical decision making by the Practitioner; and/or  . In rare instances, security protocols could fail, causing a breach of personal health information.  Furthermore, I acknowledge that it is my responsibility to provide information about my medical history, conditions and care that is complete and accurate to the best of my ability. I acknowledge that Practitioner's advice, recommendations, and/or decision may be based on factors not within their control, such as incomplete or inaccurate data provided by me or distortions of diagnostic images or specimens that may result from electronic transmissions. I understand that the practice of medicine is not an exact science and that Practitioner makes no warranties or guarantees regarding treatment outcomes. I acknowledge that I will receive a copy of this consent concurrently upon execution via email to the email address I last provided but may  also request a printed copy by calling the office of [facility].    I understand that my insurance will be billed for this visit.   I have read or had this consent read to me. . I understand the contents of this consent, which adequately explains the benefits and risks of the Services being provided via telemedicine.  . I have been provided ample opportunity to ask questions regarding this consent and the Services and have had my questions answered to my satisfaction. . I give my informed consent for the services to be provided through the use of telemedicine in my medical care  By participating in this telemedicine visit I agree to the above.

## 2018-08-17 ENCOUNTER — Other Ambulatory Visit: Payer: Self-pay

## 2018-08-17 ENCOUNTER — Telehealth (INDEPENDENT_AMBULATORY_CARE_PROVIDER_SITE_OTHER): Payer: PPO | Admitting: Physician Assistant

## 2018-08-17 ENCOUNTER — Telehealth: Payer: Self-pay | Admitting: Physician Assistant

## 2018-08-17 ENCOUNTER — Encounter: Payer: Self-pay | Admitting: Physician Assistant

## 2018-08-17 VITALS — BP 119/65 | HR 76 | Ht 63.0 in | Wt 136.8 lb

## 2018-08-17 DIAGNOSIS — R0602 Shortness of breath: Secondary | ICD-10-CM

## 2018-08-17 DIAGNOSIS — I350 Nonrheumatic aortic (valve) stenosis: Secondary | ICD-10-CM

## 2018-08-17 DIAGNOSIS — I5042 Chronic combined systolic (congestive) and diastolic (congestive) heart failure: Secondary | ICD-10-CM | POA: Diagnosis not present

## 2018-08-17 DIAGNOSIS — I2510 Atherosclerotic heart disease of native coronary artery without angina pectoris: Secondary | ICD-10-CM

## 2018-08-17 DIAGNOSIS — E785 Hyperlipidemia, unspecified: Secondary | ICD-10-CM

## 2018-08-17 DIAGNOSIS — Z7189 Other specified counseling: Secondary | ICD-10-CM

## 2018-08-17 NOTE — Telephone Encounter (Signed)
Label Here   [name] Page 1 of 1  Account #: [id number] Collection Date:       Req/Control #: [id number] Collection Time:       Client / Ordering Site Information: Physician Information:  Account Name: [place] [facility]  Address 1: [address], [address]  Address 2:   [place], [place] Zip: [place], [place] [address]  Phone: [phone]   Ordering: Liliane Shi  Degree: PA-C  NPI: [id number]  UPIN: [id number]  Physician ID:       Patient Information:  Name: [name]  Gender: Female  Date of Birth: Mar 15, 1930  Age: 24 years  Address: [place], [place] Zip: [place], [place] [address]   SSN: [id number]  Patient ID: [id number]  Phone: [phone]     Alt Control#: [id number]           Comments:      Order Code Tests Ordered (Total: 4)   Order Code Tests Ordered    [phone] CBC  [id number] Hepatic function panel   143000 Pro b natriuretic peptide (BNP)  222979 Basic metabolic panel          Diagnosis Codes:  R06.02 I25.10 E78.5 I50.42      Bill Type: Third-Party   Carrier Code MAP         Responsible Party / Guarantor Information:  Name: [name]  Address: [address] State Zip: [place], [place] [address]  Phone: [phone]  Relation to Pt: Self  Employer Name:       ABN:    Worker's Comp: N Date of Injury:         Insurance Information:  Primary Insurance: Secondary Insurance:  Ins Co Name: [employer] PPO  Address 1: [address]  Address 2:   [address] [place] [address]  Policy Number: [id number]  Group #: [id number]   Ins Co Name:   Address 1:   Address 2:   [place], [place] Zip:   Policy Number:   Group #:     Producer, television/film/video / Insured: Pharmacologist / Insured:  Name: [name], [name]  Address: [address]   [place], [place] [address]  Pt Relation to Subscriber: Self   Name:    Address:      Pt Relation to Subscriber:       Authorization - Please Sign and Date I hereby authorize the release of medical information related to the services described hereon and authorize payment directly to [facility].   __________________________________                    ______________ Patient Signature                                                                    Date   __________________________________                     ______________ Physician Signature  Date     [name], [name]  [birth year] [id number]  MRN: [id number]  Coll Dt/Time: [name], [name]  Sep 09, 1929 [id number]  MRN: [id number]  Coll Dt/Time: [name], [name]  Nov 24, 1929 [id number]  MRN: [id number]  Coll Dt/Time: [name], [name]  August 04, 1929 [id number]  MRN: [id number]  Coll Dt/Time: [name], [name]  March 12, 1930 [id number]  MRN: [id number]  Coll Dt/Time: [name], [name]  10-18-1929 [id number]  MRN: [id number]  Coll Dt/Time: [name], [name]  [birth year] [id number]  MRN: [id number]  Coll Dt/Time: [name], [name]  September 26, 1929 [id number]  MRN: [id number]  Coll Dt/Time: -

## 2018-08-17 NOTE — Telephone Encounter (Signed)
[place] Visit Initial Request  Agency Requested:    [facility] Contact:  Glory Buff, NP [place], [place] [address] Phone #:  [phone] Fax #:  [phone]  Patient Demographic Information: Name:  [name] Age:  85 y.o.   DOB:  18-Feb-1930  MRN:  [id number]   Address:   [address] [place] [address]   Phone Numbers:   Home Phone [phone]     Emergency Contact Information on File:   Contact Information    Name Relation Home Work Mobile   Burdette, Gergely   [phone]   [place] Daughter   [phone]      The above family members may be contacted for information on this patient (review DPR on file):  Yes    Patient Clinical Information:  Primary Care Provider:  Jinny Sanders, MD  Primary Cardiologist:  Sherren Mocha, MD  Primary Electrophysiologist:  None   Requesting Provider:  Richardson Dopp, PA-C     Past Medical Hx: Ms. [name]  has a past medical history of Anxiety, Aortic stenosis (02/08/2018), CAD (coronary artery disease), Chronic combined systolic and diastolic heart failure ([place]) (02/07/2018), Diaphragmatic hernia without mention of obstruction or gangrene, Diverticulosis of colon (without mention of hemorrhage) (2009), Fibromyalgia, GERD (gastroesophageal reflux disease), Hiatal hernia (2009), adenomatous colonic polyps (2007, 2009), Hyperlipidemia, IBS (irritable bowel syndrome), Internal hemorrhoids without mention of complication (0998), Thyroid disease, and Type II or unspecified type diabetes mellitus with neurological manifestations, not stated as uncontrolled(250.60).   Allergies: She is allergic to metformin; penicillins; codeine; fenofibrate; and pioglitazone.   Medications: Current Outpatient Medications on File Prior to Visit  Medication Sig  . acetaminophen (TYLENOL) 500 MG tablet Take 1,000 mg by mouth at bedtime as needed for moderate pain.   Marland Kitchen  aspirin 81 MG tablet Take 81 mg by mouth daily.    Marland Kitchen atorvastatin (LIPITOR) 40 MG tablet Take 40 mg by mouth daily.  . Carboxymethylcellulose Sodium (THERATEARS OP) Place 1 drop into both eyes daily as needed (dry eyes).  . carvedilol (COREG) 6.25 MG tablet Take 6.25 mg by mouth 2 (two) times daily with a meal.  . furosemide (LASIX) 40 MG tablet Patient taking 40 mg tablet by mouth on Thursday, Friday and Saturday  . Insulin Aspart FlexPen 100 UNIT/ML SOPN Inject 10 Units into the skin 3 (three) times daily with meals.  . insulin NPH Human (HUMULIN N,NOVOLIN N) 100 UNIT/ML injection Inject 15 Units into the skin at bedtime.   . nitroGLYCERIN (NITROSTAT) 0.4 MG SL tablet Place 1 tablet (0.4 mg total) under the tongue every 5 (five) minutes as needed.  . ONE TOUCH ULTRA TEST test strip USE 1 STRIP TO CHECK GLUCOSE 4 TIMES DAILY  . pantoprazole (PROTONIX) 40 MG tablet Take 1 tablet (40 mg total) by mouth 2 (two) times daily.  . potassium chloride (K-DUR) 10 MEQ tablet Take 1 tablet (10 mEq total) by mouth daily.  . ticagrelor (BRILINTA) 90 MG TABS tablet Take 1 tablet (90 mg total) by mouth 2 (two) times daily.   No current facility-administered medications on file prior to visit.      Social Hx: She  reports that she quit smoking about 37 years ago. Her smoking use included cigarettes. She has a 1.00 pack-year smoking history. She has never used smokeless tobacco. She reports that she does not drink alcohol or use drugs.    Diagnosis/Reason for Visit:   CHF, CAD, shortness  of breath  - Patient has a hx of MI in 05/2018 secondary to stent thrombosis in the Left Main bifurcation tx with angioplasty, CHF.  EF was 20 but improved to normal by DC in 05/2018.  She reduced her Lasix 40 mg >> 20 mg once daily about 3 weeks ago and has been more shortness of breath since. I adjusted her lasix.  She needs follow up labs, exam, VS, rhythm strip.  Services Requested:  Vital Signs (BP, Pulse, O2,  Weight)  Physical Exam  Medication Reconciliation  Rhythm Strip (AliveCor Device)  Labs:  BMET, CBC, LFTs, BNP  # of Visits Needed/Frequency per Week: 1   A copy of the office note will be faxed with this form.  All labs ordered for this home visit have been released.

## 2018-08-17 NOTE — Patient Instructions (Signed)
Medication Instructions:   Continue furosemide 40 mg daily  Increase potassium chloride 10 mEq to 2 tablets (20 mEq) daily  If you need a refill on your cardiac medications before your next appointment, please call your pharmacy.   Lab work:  I will arrange for a home visit so that someone can come out and draw blood next week (BMET, CBC, BNP, LFTs)  If you have labs (blood work) drawn today and your tests are completely normal, you will receive your results only by: Marland Kitchen MyChart Message (if you have MyChart) OR . A paper copy in the mail If you have any lab test that is abnormal or we need to change your treatment, we will call you to review the results.  Testing/Procedures:  We will reschedule your echocardiogram in the next few weeks.  Follow-Up: At [facility], you and your health needs are our priority.  As part of our continuing mission to provide you with exceptional heart care, we have created designated Provider Care Teams.  These Care Teams include your primary Cardiologist (physician) and Advanced Practice Providers (APPs -  Physician Assistants and Nurse Practitioners) who all work together to provide you with the care you need, when you need it. Richardson Dopp, PA-C in 1 week via telehealth  Any Other Special Instructions Will Be Listed Below (If Applicable).  Continue to weigh daily If your breathing gets worse, call our office If you have difficulty catching your breath at rest or develop chest discomfort like you had with your heart attack, call 911

## 2018-08-20 DIAGNOSIS — R0602 Shortness of breath: Secondary | ICD-10-CM | POA: Diagnosis not present

## 2018-08-20 DIAGNOSIS — I5042 Chronic combined systolic (congestive) and diastolic (congestive) heart failure: Secondary | ICD-10-CM | POA: Diagnosis not present

## 2018-08-20 DIAGNOSIS — I2510 Atherosclerotic heart disease of native coronary artery without angina pectoris: Secondary | ICD-10-CM | POA: Diagnosis not present

## 2018-08-20 DIAGNOSIS — E785 Hyperlipidemia, unspecified: Secondary | ICD-10-CM | POA: Diagnosis not present

## 2018-08-21 ENCOUNTER — Telehealth: Payer: Self-pay | Admitting: *Deleted

## 2018-08-21 DIAGNOSIS — R300 Dysuria: Secondary | ICD-10-CM | POA: Diagnosis not present

## 2018-08-21 LAB — CBC
Hematocrit: 34.3 % (ref 34.0–46.6)
Hemoglobin: 11.2 g/dL (ref 11.1–15.9)
MCH: 27 pg (ref 26.6–33.0)
MCHC: 32.7 g/dL (ref 31.5–35.7)
MCV: 83 fL (ref 79–97)
Platelets: 329 10*3/uL (ref 150–450)
RBC: 4.15 x10E6/uL (ref 3.77–5.28)
RDW: 14.3 % (ref 11.7–15.4)
WBC: 11.4 10*3/uL — ABNORMAL HIGH (ref 3.4–10.8)

## 2018-08-21 LAB — PRO B NATRIURETIC PEPTIDE: NT-Pro BNP: 7089 pg/mL — ABNORMAL HIGH (ref 0–738)

## 2018-08-21 LAB — HEPATIC FUNCTION PANEL
ALT: 18 IU/L (ref 0–32)
AST: 21 IU/L (ref 0–40)
Albumin: 4.1 g/dL (ref 3.6–4.6)
Alkaline Phosphatase: 170 IU/L — ABNORMAL HIGH (ref 39–117)
Bilirubin Total: 0.4 mg/dL (ref 0.0–1.2)
Bilirubin, Direct: 0.16 mg/dL (ref 0.00–0.40)
Total Protein: 7.2 g/dL (ref 6.0–8.5)

## 2018-08-21 LAB — BASIC METABOLIC PANEL
BUN/Creatinine Ratio: 20 (ref 12–28)
BUN: 25 mg/dL (ref 8–27)
CO2: 22 mmol/L (ref 20–29)
Calcium: 9.6 mg/dL (ref 8.7–10.3)
Chloride: 100 mmol/L (ref 96–106)
Creatinine, Ser: 1.25 mg/dL — ABNORMAL HIGH (ref 0.57–1.00)
GFR calc Af Amer: 45 mL/min/{1.73_m2} — ABNORMAL LOW (ref >59)
GFR calc non Af Amer: 39 mL/min/{1.73_m2} — ABNORMAL LOW (ref >59)
Glucose: 134 mg/dL — ABNORMAL HIGH (ref 65–99)
Potassium: 5.2 mmol/L (ref 3.5–5.2)
Sodium: 135 mmol/L (ref 134–144)

## 2018-08-21 NOTE — Addendum Note (Signed)
Addended byKathlen Mody, Nicki Reaper T on: 08/21/2018 04:57 PM   Modules accepted: Orders

## 2018-08-21 NOTE — Telephone Encounter (Signed)
SPOKE WITH PT FOR RESULTS AND VERBALIZED UNDERSTANDING AND PT AWARE OF TAKING 60 MG OF LASIX FOR THREE DAYS AND RESUME BACK TO LASIX 40 MG DAILY.  PT AWARE OF ONCE  WEEK REMOTE VISIT PT STATED NURSE CAME OUT YESTERDAY,  PT R/S FOR 5-19

## 2018-08-21 NOTE — Telephone Encounter (Addendum)
[place] Visit Follow Up Request   Agency Requested:    [facility] Contact:  Glory Buff, NP [address] [place], [place] [address] Phone #:  [phone] Fax #:  [phone]  Patient Demographic Information: Name:  [name] Age:  85 y.o.   DOB:  1929/06/20  MRN:  [id number]   Requesting Provider:  Richardson Dopp, PA-C     Home visit progress note(s), lab results, telemetry strips, etc were reviewed.  Provider Recommendations: Recent labs demonstrated significantly elevated pro BNP  Lab Results  Component Value Date   PROBNP 7,089 (H) 08/20/2018    Lasix has been increased further to 60 mg once daily for 3 days, then 40 mg daily. She needs a repeat BMET in 1 week  Follow up home services requested:  Vital Signs (BP, Pulse, O2, Weight)  Physical Exam  Labs:  BMET, BNP in 1 week (5/17 or 5/18 if possible)     All labs ordered for this home visit have been released.   Label Here   E[place] Page 1 of 1  Account #: [id number] Collection Date:       Req/Control #: [id number] Collection Time:       Client / Ordering Site Information: Physician Information:  Account Name: [facility]  Address 1: [address]  Address 2:   [place], [place] Zip: [place], [place] [address]  Phone: [phone]   Ordering: Liliane Shi  Degree: PA-C  NPI: [id number]  UPIN: [id number]  Physician ID:       Patient Information:  Name: [name]  Gender: Female  Date of Birth: 02-May-1929  Age: 85 years  Address: [place], [place] Zip: [place], [place] [address]   SSN: [id number]  Patient ID: [id number]  Phone: [phone]     Alt Control#: [id number]           Comments:      Order Code Tests Ordered (Total: 2)   Order Code Tests Ordered    143000 Pro b natriuretic peptide (BNP)   785885 Basic  metabolic panel          Diagnosis Codes:  I50.42 R06.02        Bill Type: Third-Party   Carrier Code          Responsible Party / Guarantor Information:  Name:   Address:   [place], [place] Zip:   Phone:   Relation to Pt:   Employer Name:       ABN:    Worker's Comp:  Date of Injury:         Insurance Information:  Primary Insurance: Secondary Insurance:  Ins Co Name: [employer] PPO  Address 1: [phone]  Address 2:   [place] [address]  Policy Number: [id number]  Group #: [facility]   Ins Co Name:   Address 1:   Address 2:   [place], [place] Zip:   Policy Number:   Group #:     Producer, television/film/video / Insured: Pharmacologist / Insured:  Name: [name], [name]  Address: [address]   [place], [place] [address]  Pt Relation to Subscriber: Self   Name:   Address:      Pt Relation to Subscriber:       Authorization - Please  Sign and Date I hereby authorize the release of medical information related to the services described hereon and authorize payment directly to [facility].   __________________________________                    ______________ Patient Signature                                                                    Date   __________________________________                     ______________ Physician Signature                                                                Date     [name]  01/07/1930 [id number]  MRN: [id number]  Coll Dt/Time: Rich Reining E  1930/02/13 [id number]  MRN: [id number]  Coll Dt/Time: Rich Reining E  04/30/1929 [id number]  MRN: [id number]  Coll Dt/Time: [name]  October 25, 1929 [id number]  MRN: [id number]  Coll Dt/Time: [name], [name]  03-Nov-1929 [id number]  MRN: [id number]  Coll Dt/Time: [name], [name]  1929-11-07 [id number]  MRN: [id number]  Coll Dt/Time: [name], [name]   Jan 15, 1930 [id number]  MRN: [id number]  Coll Dt/Time: [name], [name]  10/06/1929 [id number]  MRN: [id number]  Coll Dt/Time: -

## 2018-08-21 NOTE — Telephone Encounter (Signed)
Call pt.. have her make virtual OV for follow up elevated wbc and Alk phos as requested by Richardson Dopp.

## 2018-08-22 ENCOUNTER — Telehealth (HOSPITAL_COMMUNITY): Payer: Self-pay | Admitting: *Deleted

## 2018-08-22 NOTE — Progress Notes (Signed)
Notes reviewed. Labs from yesterday reviewed. Lasix adjusted yesterday for elevated BNP and repeat labs requested.   Richardson Dopp, PA-C    08/22/2018 1:22 PM

## 2018-08-22 NOTE — Telephone Encounter (Signed)

## 2018-08-22 NOTE — Telephone Encounter (Signed)
Appointment 5/22 with dr Diona Browner Pt wanted to make appointment after cardiology appointments

## 2018-08-24 ENCOUNTER — Ambulatory Visit (HOSPITAL_COMMUNITY): Payer: PPO | Attending: Cardiovascular Disease

## 2018-08-24 ENCOUNTER — Other Ambulatory Visit: Payer: Self-pay

## 2018-08-24 DIAGNOSIS — I5032 Chronic diastolic (congestive) heart failure: Secondary | ICD-10-CM | POA: Insufficient documentation

## 2018-08-27 NOTE — Progress Notes (Signed)
Virtual Visit via Telephone Note   This visit type was conducted due to national recommendations for restrictions regarding the COVID-19 Pandemic (e.g. social distancing) in an effort to limit this patient's exposure and mitigate transmission in our community.  Due to her co-morbid illnesses, this patient is at least at moderate risk for complications without adequate follow up.  This format is felt to be most appropriate for this patient at this time.  The patient did not have access to video technology/had technical difficulties with video requiring transitioning to audio format only (telephone).  All issues noted in this document were discussed and addressed.  No physical exam could be performed with this format.  Please refer to the patient's chart for her  consent to telehealth for [facility].   Date:  08/28/2018   ID:  [name], DOB 1930/03/13, MRN [id number]  Patient Location: Home Provider Location: Home  PCP:  Jinny Sanders, MD  Cardiologist:  Sherren Mocha, MD   Electrophysiologist:  None   Evaluation Performed:  Follow-Up Visit  Chief Complaint:  Follow up on CHF  History of Present Illness:    ATHALENE KOLLE is a 85 y.o. female with coronary artery disease, systolic HF. She had a NSTEMI in 2019 during an admission for a hip fracture. She was managed medically at that time.  She ultimately underwent a Cardiac Catheterizationthat demonstrated critical LM stenosis and CTO of the RCA. She was not a candidate for CABG due to age and poor functional capacity. She underwent atherectomy with Impella hemodynamic support and LM bifurcation stenting in 02/2018.  She was then admitted in 05/2018 with a STEMI c/b cardiogenic shock 2/2 LM in stent restenosis requiring angioplasty of the LM and ostial LCx.  EF was initially 20% but improved to 50-55% prior to DC.  She was transitioned from Clopidogrel to Ticagrelor.  I saw her recently via Telemedicine due to volume excess.  Her  proBNP was 7K.  I adjusted her Lasix.  A follow up echo was done 08/24/2018 and demonstrated normal EF and mod diastolic dysfunction.  Today, she notes she is feeling the same.  She continues to feel short of breath with minimal activity.  She also notes orthopnea.  She denies lower extremity swelling.  She has not had syncope.  She has not had any anginal symptoms.  If she is breathing heavy, she sometimes feels some tightness in her chest.  Overall, she has not really noticed much improvement in her breathing.  The patient does not have symptoms concerning for COVID-19 infection (fever, chills, cough, or new shortness of breath).    Past Medical History:  Diagnosis Date  . Anxiety   . Aortic stenosis 02/08/2018   Mod by echo 05/2018: Mean 19 mmHg  . CAD (coronary artery disease)    NSTEMI in 2019 tx medically // [place] in 11/19: critical LM stenosis; CTO of the RCA >> DES to LM/LAD and DES to oLCx ([place] technique) // s/p STEMI 05/2018 2/2 stent thrombosis c/b CGS >> POBA to LM/LAD and LCx (changed from Plavix to Brilinta); supported with Impella  . Chronic combined systolic and diastolic heart failure ([place]) 02/07/2018   Echo 05/2018: EF 20 // repeat Echo (post PCI) 05/23/2018: EF 50-55, moderate LVH, diastolic dysfunction, mild MAC, moderate MR, trivial AI, mild to moderate aortic stenosis (mean 19)  . Diaphragmatic hernia without mention of obstruction or gangrene   . Diverticulosis of colon (without mention of hemorrhage) 2009  . Fibromyalgia   .  GERD (gastroesophageal reflux disease)   . Hiatal hernia 2009   s/p nissen fundoplication 1950.    Marland Kitchen Hx of adenomatous colonic polyps 2007, 2009   Adenomatous polyps in 2007 and 2009.  Hyperplastic polyps in 2009  . Hyperlipidemia   . IBS (irritable bowel syndrome)   . Internal hemorrhoids without mention of complication 9326   Seen at colonoscopy.  . Thyroid disease   . Type II or unspecified type diabetes mellitus with neurological  manifestations, not stated as uncontrolled(250.60)    Past Surgical History:  Procedure Laterality Date  . ABDOMINAL HYSTERECTOMY    . BASAL CELL CARCINOMA EXCISION  04/2016   corner of left eye  . bmd  2007  . CARDIOVERSION N/A 05/19/2018   Procedure: CARDIOVERSION;  Surgeon: Troy Sine, MD;  Location: [facility];  Service: Cardiovascular;  Laterality: N/A;  . CATARACT EXTRACTION    . CORONARY ATHERECTOMY N/A 02/13/2018   Procedure: CORONARY ATHERECTOMY;  Surgeon: Sherren Mocha, MD;  Location: [facility];  Service: Cardiovascular;  Laterality: N/A;  . CORONARY BALLOON ANGIOPLASTY N/A 05/19/2018   Procedure: CORONARY BALLOON ANGIOPLASTY;  Surgeon: Troy Sine, MD;  Location: [facility];  Service: Cardiovascular;  Laterality: N/A;  . ESOPHAGOGASTRODUODENOSCOPY  2007  . HERNIA REPAIR    . INTRAMEDULLARY (IM) NAIL INTERTROCHANTERIC Right 03/01/2017   Procedure: INTRAMEDULLARY (IM) NAIL INTERTROCHANTRIC;  Surgeon: Hiram Gash, MD;  Location: [facility];  Service: Orthopedics;  Laterality: Right;  . LAPAROSCOPIC NISSEN FUNDOPLICATION  7124  . ORIF WRIST FRACTURE Right 03/01/2017   Procedure: OPEN REDUCTION INTERNAL FIXATION (ORIF) WRIST FRACTURE;  Surgeon: Hiram Gash, MD;  Location: [facility];  Service: Orthopedics;  Laterality: Right;  . RIGHT/LEFT HEART CATH AND CORONARY ANGIOGRAPHY N/A 02/07/2018   Procedure: RIGHT/LEFT HEART CATH AND CORONARY ANGIOGRAPHY;  Surgeon: Sherren Mocha, MD;  Location: [facility];  Service: Cardiovascular;  Laterality: N/A;  . RIGHT/LEFT HEART CATH AND CORONARY ANGIOGRAPHY N/A 05/19/2018   Procedure: RIGHT/LEFT HEART CATH AND CORONARY ANGIOGRAPHY;  Surgeon: Troy Sine, MD;  Location: [facility];  Service: Cardiovascular;  Laterality: N/A;  . THYROIDECTOMY, PARTIAL    . VENTRICULAR ASSIST DEVICE INSERTION N/A 02/13/2018   Procedure: VENTRICULAR ASSIST DEVICE INSERTION;  Surgeon: Sherren Mocha, MD;  Location: [facility];  Service: Cardiovascular;  Laterality: N/A;  . VENTRICULAR ASSIST DEVICE INSERTION N/A 05/19/2018   Procedure: VENTRICULAR ASSIST DEVICE INSERTION;  Surgeon: Troy Sine, MD;  Location: [facility];  Service: Cardiovascular;  Laterality: N/A;     Current Meds  Medication Sig  . acetaminophen (TYLENOL) 500 MG tablet Take 1,000 mg by mouth at bedtime as needed for moderate pain.   Marland Kitchen aspirin 81 MG tablet Take 81 mg by mouth daily.    Marland Kitchen atorvastatin (LIPITOR) 40 MG tablet Take 40 mg by mouth daily.  . Carboxymethylcellulose Sodium (THERATEARS OP) Place 1 drop into both eyes daily as needed (dry eyes).  . carvedilol (COREG) 6.25 MG tablet Take 6.25 mg by mouth 2 (two) times daily with a meal.  . Insulin Aspart FlexPen 100 UNIT/ML SOPN Inject 10 Units into the skin 3 (three) times daily with meals.  . insulin NPH Human (HUMULIN N,NOVOLIN N) 100 UNIT/ML injection Inject 15 Units into the skin at bedtime.   . nitroGLYCERIN (NITROSTAT) 0.4 MG SL tablet Place 1 tablet (0.4 mg total) under the tongue every 5 (five) minutes as needed.  . ONE TOUCH  ULTRA TEST test strip USE 1 STRIP TO CHECK GLUCOSE 4 TIMES DAILY  . pantoprazole (PROTONIX) 40 MG tablet Take 1 tablet (40 mg total) by mouth 2 (two) times daily.  . ticagrelor (BRILINTA) 90 MG TABS tablet Take 1 tablet (90 mg total) by mouth 2 (two) times daily.  . [DISCONTINUED] furosemide (LASIX) 40 MG tablet Patient taking 40 mg tablet by mouth on Thursday, Friday and Saturday  . [DISCONTINUED] potassium chloride (K-DUR) 10 MEQ tablet Take 1 tablet (10 mEq total) by mouth daily.     Allergies:   Metformin; Penicillins; Codeine; Fenofibrate; and Pioglitazone   Social History   Tobacco Use  . Smoking status: Former Smoker    Packs/day: 0.25    Years: 4.00    Pack years: 1.00    Types: Cigarettes    Last attempt to quit: 03/10/1981    Years since quitting: 37.4  . Smokeless tobacco: Never Used  Substance Use Topics  .  Alcohol use: No    Alcohol/week: 0.0 standard drinks  . Drug use: No     Family Hx: The patient's family history includes Breast cancer in her paternal aunt; Cancer in her mother; Diabetes in her maternal grandfather and son; Heart disease in her paternal grandmother; Hyperlipidemia in her son; Uterine cancer in her mother. There is no history of Colon cancer or Stomach cancer.  ROS:   Please see the history of present illness.     All other systems reviewed and are negative.   Prior CV studies:   The following studies were reviewed today:   Echo 08/24/2018 EF 50-55, mod LVH, Gr 2 DD, RVSP 41.7, mod MR, trivial AI, mod AS (mean 27), dilated Asc Aorta (38 mm)  Echocardiogram 05/23/2018 EF 50-55, moderate LVH, diastolic dysfunction, mild MAC, moderate MR, trivial AI, mild to moderate aortic stenosis (mean 19)  Limited echocardiogram 05/21/2018 EF 55-60, mild LVH,, mild MAC, mild AI  Echocardiogram 05/19/2018 EF 20, diffuse HK, Impella catheter noted in LV, moderate MR, mild AI, PASP 48  Cardiac catheterization 05/19/2018 LM ostial 95 ISR LAD ostial 80, proximal 60, mid 75 LCx ostial 50 RCA ostial 100 with left-to-right collaterals PCI: Balloon angioplasty to the ostial left main, ostial LAD and ostial LCx   Labs/Other Tests and Data Reviewed:    EKG:  No ECG reviewed.  Recent Labs: 05/19/2018: B Natriuretic Peptide 870.3 05/24/2018: Magnesium 1.9 08/20/2018: ALT 18; BUN 25; Creatinine, Ser 1.25; Hemoglobin 11.2; NT-Pro BNP 7,089; Platelets 329; Potassium 5.2; Sodium 135   Recent Lipid Panel Lab Results  Component Value Date/Time   CHOL 83 05/21/2018 05:07 AM   TRIG 85 05/21/2018 05:07 AM   HDL 33 (L) 05/21/2018 05:07 AM   CHOLHDL 2.5 05/21/2018 05:07 AM   LDLCALC 33 05/21/2018 05:07 AM   LDLDIRECT 101.0 06/23/2016 11:57 AM    Wt Readings from Last 3 Encounters:  08/28/18 135 lb (61.2 kg)  08/17/18 136 lb 12.8 oz (62.1 kg)  06/06/18 139 lb (63 kg)     Objective:     Vital Signs:  Ht _0  (1.6 m)   Wt 135 lb (61.2 kg)   BMI 23.91 kg/m    VITAL SIGNS:  reviewed GEN:  no acute distress RESPIRATORY:  No labored breathing noted NEURO:  Alert and oriented PSYCH:  She seems to be in decent spirits  ASSESSMENT & PLAN:     Acute on chronic diastolic heart failure (HCC) EF 50-55 with moderate diastolic dysfunction by recent echocardiogram.  Pressure analysis  demonstrated volume overload.  The patient remains significantly volume overloaded.  As noted, evaluating her in this format is difficult with limitations.  I think she needs further diuresis as well as an in person follow-up in the office.  The home visit nurse practitioner just left her house.  Therefore, labs are pending.  She does note that she drinks fluids all day long.  Question if she is drinking too many fluids.  -Decrease fluids to 60 ounces per day  -Increase Lasix to 60 mg twice daily x3 days  -After 3 days, reduce Lasix to 40 mg twice daily  -Continue potassium 10 mEq twice daily  -Arrange in person visit in the office with APP or MD later this week  Coronary artery disease involving native coronary artery of native heart without angina pectoris History of MI in 2019 originally managed medically.  She ultimately underwent complicated left main bifurcation stenting in November 2019.  She was admitted in February 2020 with acute MI complicated by cardiogenic shock related to stent thrombosis.  This was treated with angioplasty.  No recurrent angina.  Continue aspirin, statin, Brilinta, beta-blocker.  Nonrheumatic aortic valve stenosis Moderate by recent echocardiogram.   Time:   Today, I have spent 19 minutes with the patient with telehealth technology discussing the above problems.     Medication Adjustments/Labs and Tests Ordered: Current medicines are reviewed at length with the patient today.  Concerns regarding medicines are outlined above.   Tests Ordered: Orders Placed This  Encounter  Procedures  . Basic metabolic panel  . Pro b natriuretic peptide (BNP)    Medication Changes: Meds ordered this encounter  Medications  . potassium chloride (K-DUR) 10 MEQ tablet    Sig: Take 1 tablet (10 mEq total) by mouth 2 (two) times daily.    Dispense:  180 tablet    Refill:  3    Order Specific Question:   Supervising Provider    Answer:   Lelon Perla [[id number]]  . furosemide (LASIX) 40 MG tablet    Sig: Take 1 and 1/2 tabs (60 mg) twice daily for 3 days, then reduce to 1 tab (40 mg) twice daily    Dispense:  180 tablet    Refill:  3    Dose increase    Order Specific Question:   Supervising Provider    Answer:   Lelon Perla [[id number]]    Disposition:  Follow up in 3 day(s)  Signed, Richardson Dopp, PA-C  08/28/2018 4:46 PM    [place]

## 2018-08-28 ENCOUNTER — Other Ambulatory Visit: Payer: Self-pay

## 2018-08-28 ENCOUNTER — Telehealth (INDEPENDENT_AMBULATORY_CARE_PROVIDER_SITE_OTHER): Payer: PPO | Admitting: Physician Assistant

## 2018-08-28 ENCOUNTER — Encounter: Payer: Self-pay | Admitting: Physician Assistant

## 2018-08-28 VITALS — Ht 63.0 in | Wt 135.0 lb

## 2018-08-28 DIAGNOSIS — R0602 Shortness of breath: Secondary | ICD-10-CM | POA: Diagnosis not present

## 2018-08-28 DIAGNOSIS — I5042 Chronic combined systolic (congestive) and diastolic (congestive) heart failure: Secondary | ICD-10-CM | POA: Diagnosis not present

## 2018-08-28 DIAGNOSIS — Z7189 Other specified counseling: Secondary | ICD-10-CM

## 2018-08-28 DIAGNOSIS — I5033 Acute on chronic diastolic (congestive) heart failure: Secondary | ICD-10-CM

## 2018-08-28 DIAGNOSIS — I2510 Atherosclerotic heart disease of native coronary artery without angina pectoris: Secondary | ICD-10-CM

## 2018-08-28 DIAGNOSIS — I350 Nonrheumatic aortic (valve) stenosis: Secondary | ICD-10-CM

## 2018-08-28 MED ORDER — POTASSIUM CHLORIDE ER 10 MEQ PO TBCR: 10.0000 meq | EXTENDED_RELEASE_TABLET | Freq: Two times a day (BID) | ORAL | 3 refills | Status: DC

## 2018-08-28 MED ORDER — FUROSEMIDE 40 MG PO TABS: ORAL_TABLET | ORAL | 3 refills | Status: DC

## 2018-08-28 NOTE — Progress Notes (Signed)
Scott I'm happy to see her in person on my next DOD day if she needs to be examined

## 2018-08-28 NOTE — Patient Instructions (Signed)
Medication Instructions:  Increase Lasix (Furosemide) to 1 and 1/2 tablets (60 mg) for 3 days. Then, after 3 days, reduce the dose of Lasix ot 40 mg twice daily   Continue taking potassium (K-Dur) 10 mEq twice daily   If you need a refill on your cardiac medications before your next appointment, please call your pharmacy.   Lab work: We will arrange lab work at an office visit this week.  If you have labs (blood work) drawn today and your tests are completely normal, you will receive your results only by: Marland Kitchen MyChart Message (if you have MyChart) OR . A paper copy in the mail If you have any lab test that is abnormal or we need to change your treatment, we will call you to review the results.  Testing/Procedures: None   Follow-Up: At [facility], you and your health needs are our priority.  As part of our continuing mission to provide you with exceptional heart care, we have created designated Provider Care Teams.  These Care Teams include your primary Cardiologist (physician) and Advanced Practice Providers (APPs -  Physician Assistants and Nurse Practitioners) who all work together to provide you with the care you need, when you need it. . We will arrange an in person visit with a provider this week  Any Other Special Instructions Will Be Listed Below (If Applicable).  Restrict your fluid intake to 60 ounces per day.  Call if your breathing gets worse.  Weigh daily and bring your list of weights to your next office visit.

## 2018-08-29 LAB — BASIC METABOLIC PANEL
BUN/Creatinine Ratio: 23 (ref 12–28)
BUN: 27 mg/dL (ref 8–27)
CO2: 22 mmol/L (ref 20–29)
Calcium: 9.4 mg/dL (ref 8.7–10.3)
Chloride: 101 mmol/L (ref 96–106)
Creatinine, Ser: 1.18 mg/dL — ABNORMAL HIGH (ref 0.57–1.00)
GFR calc Af Amer: 48 mL/min/{1.73_m2} — ABNORMAL LOW (ref >59)
GFR calc non Af Amer: 42 mL/min/{1.73_m2} — ABNORMAL LOW (ref >59)
Glucose: 125 mg/dL — ABNORMAL HIGH (ref 65–99)
Potassium: 5.3 mmol/L — ABNORMAL HIGH (ref 3.5–5.2)
Sodium: 137 mmol/L (ref 134–144)

## 2018-08-29 LAB — PRO B NATRIURETIC PEPTIDE: NT-Pro BNP: 4793 pg/mL — ABNORMAL HIGH (ref 0–738)

## 2018-08-29 NOTE — Progress Notes (Signed)
Sounds good thanks

## 2018-08-29 NOTE — Progress Notes (Signed)
Cardiology Office Note   Date:  08/31/2018   ID:  [name], DOB 01/26/1930, MRN [id number]  PCP:  [name] Sanders, MD  Cardiologist:  Dr. Burt [name]    Chief Complaint  Patient presents with  . Shortness of Breath      History of Present Illness: [name] is a 85 y.o. female who presents for increased SOB.  Her lasix has been increased.    She has a hx of coronary artery disease, systolic HF. She had a NSTEMI in 2019 during an admission for a hip fracture. She was managed medically at that time. An echo 1 year later demonstrated improved LVF. A Cardiac Catheterizationdemonstrated critical LM stenosis and CTO of the RCA. She was not a candidate for CABG due to age and poor functional capacity. She underwent atherectomy with Impella hemodynamic support and LM bifurcation stenting in 02/2018.  She was then admitted in 05/2018 with an STEMI c/b cardiogenic shock 2/2 LM in stent restenosis.  coronary artery disease, systolic HF. She had a NSTEMI in 2019 during an admission for a hip fracture. She was managed medically at that time. An echo 1 year later demonstrated improved LVF. A Cardiac Catheterizationdemonstrated critical LM stenosis and CTO of the RCA. She was not a candidate for CABG due to age and poor functional capacity. She underwent atherectomy with Impella hemodynamic support and LM bifurcation stenting in 02/2018.  EF was initially 20% but improved to 50-55% prior to DC.  She was last seen 06/06/2018.  Last telehealth visit 08/17/18.   5/8 she had increased SOB, lasix increased for 3 days, then she realized with new refill of lasix she was on lower dose.  Lasix increased to 40 mg daily.   yesterday she was still SOB with minimal activity and + orthopnea.  She does admit to tightness in her chest.  Lasix increased to 40 mg BID for 3 days, and now here for further eval.  Labs with improved Cr to 1.18, K+ 5.3 with reduction of K+  Pro bNP was 4793 down from 7089   Echo  with EF 50-55%  Today improved.  Able to sleep without SOB now.  Feels better and no lower ext edema.  No chest pain.  EKG with PVCs but pt is not aware.  soes not eat much salt.  Wt is down 5 lbs.   Past Medical History:  Diagnosis Date  . Anxiety   . Aortic stenosis 02/08/2018   Mod by echo 05/2018: Mean 19 mmHg  . CAD (coronary artery disease)    NSTEMI in 2019 tx medically // [place] in 11/19: critical LM stenosis; CTO of the RCA >> DES to LM/LAD and DES to oLCx ([place] technique) // s/p STEMI 05/2018 2/2 stent thrombosis c/b CGS >> POBA to LM/LAD and LCx (changed from Plavix to Brilinta); supported with Impella  . Chronic combined systolic and diastolic heart failure ([place]) 02/07/2018   Echo 05/2018: EF 20 // repeat Echo (post PCI) 05/23/2018: EF 50-55, moderate LVH, diastolic dysfunction, mild MAC, moderate MR, trivial AI, mild to moderate aortic stenosis (mean 19)  . Diaphragmatic hernia without mention of obstruction or gangrene   . Diverticulosis of colon (without mention of hemorrhage) 2009  . Fibromyalgia   . GERD (gastroesophageal reflux disease)   . Hiatal hernia 2009   s/p nissen fundoplication 4536.    [name] Hx of adenomatous colonic polyps 2007, 2009   Adenomatous polyps in 2007 and 2009.  Hyperplastic polyps in 2009  .  Hyperlipidemia   . IBS (irritable bowel syndrome)   . Internal hemorrhoids without mention of complication 7867   Seen at colonoscopy.  . Thyroid disease   . Type II or unspecified type diabetes mellitus with neurological manifestations, not stated as uncontrolled(250.60)     Past Surgical History:  Procedure Laterality Date  . ABDOMINAL HYSTERECTOMY    . BASAL CELL CARCINOMA EXCISION  04/2016   corner of left eye  . bmd  2007  . CARDIOVERSION N/A 05/19/2018   Procedure: CARDIOVERSION;  Surgeon: Troy Sine, MD;  Location: [facility];  Service: Cardiovascular;  Laterality: N/A;  . CATARACT EXTRACTION    . CORONARY ATHERECTOMY N/A 02/13/2018    Procedure: CORONARY ATHERECTOMY;  Surgeon: Sherren Mocha, MD;  Location: [place] [facility];  Service: Cardiovascular;  Laterality: N/A;  . CORONARY BALLOON ANGIOPLASTY N/A 05/19/2018   Procedure: CORONARY BALLOON ANGIOPLASTY;  Surgeon: Troy Sine, MD;  Location: [facility];  Service: Cardiovascular;  Laterality: N/A;  . ESOPHAGOGASTRODUODENOSCOPY  2007  . HERNIA REPAIR    . INTRAMEDULLARY (IM) NAIL INTERTROCHANTERIC Right 03/01/2017   Procedure: INTRAMEDULLARY (IM) NAIL INTERTROCHANTRIC;  Surgeon: Hiram Gash, MD;  Location: [facility];  Service: Orthopedics;  Laterality: Right;  . LAPAROSCOPIC NISSEN FUNDOPLICATION  5449  . ORIF WRIST FRACTURE Right 03/01/2017   Procedure: OPEN REDUCTION INTERNAL FIXATION (ORIF) WRIST FRACTURE;  Surgeon: Hiram Gash, MD;  Location: [facility];  Service: Orthopedics;  Laterality: Right;  . RIGHT/LEFT HEART CATH AND CORONARY ANGIOGRAPHY N/A 02/07/2018   Procedure: RIGHT/LEFT HEART CATH AND CORONARY ANGIOGRAPHY;  Surgeon: Sherren Mocha, MD;  Location: [place] [facility];  Service: Cardiovascular;  Laterality: N/A;  . RIGHT/LEFT HEART CATH AND CORONARY ANGIOGRAPHY N/A 05/19/2018   Procedure: RIGHT/LEFT HEART CATH AND CORONARY ANGIOGRAPHY;  Surgeon: Troy Sine, MD;  Location: [facility];  Service: Cardiovascular;  Laterality: N/A;  . THYROIDECTOMY, PARTIAL    . VENTRICULAR ASSIST DEVICE INSERTION N/A 02/13/2018   Procedure: VENTRICULAR ASSIST DEVICE INSERTION;  Surgeon: Sherren Mocha, MD;  Location: [facility];  Service: Cardiovascular;  Laterality: N/A;  . VENTRICULAR ASSIST DEVICE INSERTION N/A 05/19/2018   Procedure: VENTRICULAR ASSIST DEVICE INSERTION;  Surgeon: Troy Sine, MD;  Location: [place] [facility];  Service: Cardiovascular;  Laterality: N/A;     Current Outpatient Medications  Medication Sig Dispense Refill  . acetaminophen (TYLENOL) 500 MG tablet Take 1,000 mg by mouth at bedtime as needed for moderate pain.     [name]  aspirin 81 MG tablet Take 81 mg by mouth daily.      [name] atorvastatin (LIPITOR) 40 MG tablet Take 40 mg by mouth daily.    . Carboxymethylcellulose Sodium (THERATEARS OP) Place 1 drop into both eyes daily as needed (dry eyes).    . carvedilol (COREG) 6.25 MG tablet Take 6.25 mg by mouth 2 (two) times daily with a meal.    . Insulin Aspart FlexPen 100 UNIT/ML SOPN Inject 10 Units into the skin 3 (three) times daily with meals. 15 mL 0  . insulin NPH Human (HUMULIN N,NOVOLIN N) 100 UNIT/ML injection Inject 15 Units into the skin at bedtime.     . nitroGLYCERIN (NITROSTAT) 0.4 MG SL tablet Place 1 tablet (0.4 mg total) under the tongue every 5 (five) minutes as needed. 25 tablet 2  . ONE TOUCH ULTRA TEST test strip USE 1 STRIP TO CHECK GLUCOSE 4 TIMES DAILY 125 each 11  . pantoprazole (PROTONIX)  40 MG tablet Take 1 tablet (40 mg total) by mouth 2 (two) times daily. 180 tablet 3  . potassium chloride (K-DUR) 10 MEQ tablet Take 1 tablet (10 mEq total) by mouth 2 (two) times daily. 180 tablet 3  . ticagrelor (BRILINTA) 90 MG TABS tablet Take 1 tablet (90 mg total) by mouth 2 (two) times daily. 60 tablet 5  . furosemide (LASIX) 40 MG tablet Take 1.5 tablets (60 mg total) by mouth daily. 135 tablet 3   No current facility-administered medications for this visit.     Allergies:   Metformin; Penicillins; Codeine; Fenofibrate; and Pioglitazone    Social History:  The patient  reports that she quit smoking about 37 years ago. Her smoking use included cigarettes. She has a 1.00 pack-year smoking history. She has never used smokeless tobacco. She reports that she does not drink alcohol or use drugs.   Family History:  The patient's family history includes Breast cancer in her paternal aunt; Cancer in her mother; Diabetes in her maternal grandfather and son; Heart disease in her paternal grandmother; Hyperlipidemia in her son; Uterine cancer in her mother.    ROS:  General:no colds or fevers, no weight  changes Skin:no rashes or ulcers HEENT:no blurred vision, no congestion CV:see HPI PUL:see HPI GI:no diarrhea constipation or melena, no indigestion GU:no hematuria, no dysuria MS:no joint pain, no claudication Neuro:no syncope, no lightheadedness Endo:+ diabetes, no thyroid disease  Wt Readings from Last 3 Encounters:  08/31/18 132 lb (59.9 kg)  08/28/18 135 lb (61.2 kg)  08/17/18 136 lb 12.8 oz (62.1 kg)     PHYSICAL EXAM: VS:  BP (!) 104/52   Pulse 76   Ht _0  (1.6 m)   Wt 132 lb (59.9 kg)   SpO2 98%   BMI 23.38 kg/m  , BMI Body mass index is 23.38 kg/m. General:Pleasant affect, NAD Skin:Warm and dry, brisk capillary refill HEENT:normocephalic, sclera clear, mucus membranes moist Neck:supple, tr JVD, no bruits  Heart:S1S2 RRR with harsh murmur 2/6 on normal beats but not PVCs, no gallup, rub or click Lungs:clear without rales, rhonchi, or wheezes NWG:NFAO, non tender, + BS, do not palpate liver spleen or masses Ext:no lower ext edema, 2+ pedal pulses, 2+ radial pulses Neuro:alert and oriented X 3, MAE, follows commands, + facial symmetry    EKG:  EKG is ordered today. The ekg ordered today demonstrates SR with PVCs no acute changes otherwise.     Recent Labs: 05/19/2018: B Natriuretic Peptide 870.3 05/24/2018: Magnesium 1.9 08/20/2018: ALT 18; Hemoglobin 11.2; Platelets 329 08/28/2018: BUN 27; Creatinine, Ser 1.18; NT-Pro BNP 4,793; Potassium 5.3; Sodium 137    Lipid Panel    Component Value Date/Time   CHOL 83 05/21/2018 0507   TRIG 85 05/21/2018 0507   HDL 33 (L) 05/21/2018 0507   CHOLHDL 2.5 05/21/2018 0507   VLDL 17 05/21/2018 0507   LDLCALC 33 05/21/2018 0507   LDLDIRECT 101.0 06/23/2016 1157       Other studies Reviewed: Additional studies/ records that were reviewed today include: . Echo 08/24/18  Improved LV function noted EF 50-55%.. Moderate MR/AS. High filling pressures suggested by doppler data. Continue medical therapy, diuretic use as  tolerated  Echocardiogram 05/23/2018 EF 50-55, moderate LVH, diastolic dysfunction, mild MAC, moderate MR, trivial AI, mild to moderate aortic stenosis (mean 19)  Limited echocardiogram 05/21/2018 EF 55-60, mild LVH,, mild MAC, mild AI  Echocardiogram 05/19/2018 EF 20, diffuse HK, Impella catheter noted in LV, moderate MR, mild AI, PASP  71  Cardiac catheterization 05/19/2018 LM ostial 95 ISR LAD ostial 80, proximal 60, mid 75 LCx ostial 50 RCA ostial 100 with left-to-right collaterals PCI: Balloon angioplasty to the ostial left main, ostial LAD and ostial LCx     ASSESSMENT AND PLAN:  1.  CHF improved with increased Lasix.  Today BP down some and her home wt was 131 so wt down 5 lbs and breathing better. With BP  Lower will decrease lasix to 60 mg daily.  BMP, BNP and Mg+ today - follow up virtually with Richardson Dopp in 1 week, if SOB increases may need to stay of lasix 40 BID.   Other thought with freq PVCs may need monitor.    2.  Recent ST elevation MI stable no angina.   3.  Chronic combined systolic diastolic HF -- EF now normal.   4.  Non rheumatic AS.   Current medicines are reviewed with the patient today.  The patient Has no concerns regarding medicines.  The following changes have been made:  See above Labs/ tests ordered today include:see above  Disposition:   FU:  see above  Signed, Cecilie Kicks, NP  08/31/2018 1:16 PM    [facility] [place], [place], [place], [place] Phone: [phone]; Fax: [phone]

## 2018-08-31 ENCOUNTER — Other Ambulatory Visit: Payer: Self-pay

## 2018-08-31 ENCOUNTER — Ambulatory Visit: Payer: PPO | Admitting: Family Medicine

## 2018-08-31 ENCOUNTER — Encounter: Payer: Self-pay | Admitting: Cardiology

## 2018-08-31 ENCOUNTER — Ambulatory Visit (INDEPENDENT_AMBULATORY_CARE_PROVIDER_SITE_OTHER): Payer: PPO | Admitting: Cardiology

## 2018-08-31 ENCOUNTER — Other Ambulatory Visit: Payer: PPO | Admitting: *Deleted

## 2018-08-31 VITALS — BP 104/52 | HR 76 | Ht 63.0 in | Wt 132.0 lb

## 2018-08-31 DIAGNOSIS — I2510 Atherosclerotic heart disease of native coronary artery without angina pectoris: Secondary | ICD-10-CM | POA: Diagnosis not present

## 2018-08-31 DIAGNOSIS — I2101 ST elevation (STEMI) myocardial infarction involving left main coronary artery: Secondary | ICD-10-CM

## 2018-08-31 DIAGNOSIS — I5033 Acute on chronic diastolic (congestive) heart failure: Secondary | ICD-10-CM | POA: Diagnosis not present

## 2018-08-31 DIAGNOSIS — I5042 Chronic combined systolic (congestive) and diastolic (congestive) heart failure: Secondary | ICD-10-CM | POA: Diagnosis not present

## 2018-08-31 DIAGNOSIS — R0602 Shortness of breath: Secondary | ICD-10-CM

## 2018-08-31 LAB — BASIC METABOLIC PANEL
BUN/Creatinine Ratio: 23 (ref 12–28)
BUN: 31 mg/dL — ABNORMAL HIGH (ref 8–27)
CO2: 23 mmol/L (ref 20–29)
Calcium: 9.4 mg/dL (ref 8.7–10.3)
Chloride: 97 mmol/L (ref 96–106)
Creatinine, Ser: 1.34 mg/dL — ABNORMAL HIGH (ref 0.57–1.00)
GFR calc Af Amer: 41 mL/min/{1.73_m2} — ABNORMAL LOW (ref >59)
GFR calc non Af Amer: 36 mL/min/{1.73_m2} — ABNORMAL LOW (ref >59)
Glucose: 154 mg/dL — ABNORMAL HIGH (ref 65–99)
Potassium: 4.1 mmol/L (ref 3.5–5.2)
Sodium: 136 mmol/L (ref 134–144)

## 2018-08-31 LAB — MAGNESIUM: Magnesium: 2.1 mg/dL (ref 1.6–2.3)

## 2018-08-31 LAB — PRO B NATRIURETIC PEPTIDE: NT-Pro BNP: 4085 pg/mL — ABNORMAL HIGH (ref 0–738)

## 2018-08-31 MED ORDER — FUROSEMIDE 40 MG PO TABS: 60.0000 mg | ORAL_TABLET | Freq: Every day | ORAL | 3 refills | Status: DC

## 2018-08-31 NOTE — Patient Instructions (Addendum)
Medication Instructions:  Your physician has recommended you make the following change in your medication:  1.  CHANGE the Lasix to 40 mg taking 1 1/2 tablet daily  If you need a refill on your cardiac medications before your next appointment, please call your pharmacy.   Lab work: TODAY:  PRO BNP, MAG, & BMET  If you have labs (blood work) drawn today and your tests are completely normal, you will receive your results only by: Marland Kitchen MyChart Message (if you have MyChart) OR . A paper copy in the mail If you have any lab test that is abnormal or we need to change your treatment, we will call you to review the results.  Testing/Procedures: None ordered  Follow-Up: At [facility], you and your health needs are our priority.  As part of our continuing mission to provide you with exceptional heart care, we have created designated Provider Care Teams.  These Care Teams include your primary Cardiologist (physician) and Advanced Practice Providers (APPs -  Physician Assistants and Nurse Practitioners) who all work together to provide you with the care you need, when you need it. YOU ARE SCHEDULED FOR A PHONE VISIT 09/05/2018 WITH SCOTT WEAVER, PA-C  Any Other Special Instructions Will Be Listed Below (If Applicable).

## 2018-09-04 ENCOUNTER — Telehealth: Payer: Self-pay | Admitting: *Deleted

## 2018-09-04 ENCOUNTER — Encounter: Payer: Self-pay | Admitting: Family Medicine

## 2018-09-04 ENCOUNTER — Ambulatory Visit (INDEPENDENT_AMBULATORY_CARE_PROVIDER_SITE_OTHER): Payer: PPO | Admitting: Family Medicine

## 2018-09-04 VITALS — BP 120/58 | HR 68 | Ht 63.5 in | Wt 132.5 lb

## 2018-09-04 DIAGNOSIS — I5042 Chronic combined systolic (congestive) and diastolic (congestive) heart failure: Secondary | ICD-10-CM

## 2018-09-04 DIAGNOSIS — E785 Hyperlipidemia, unspecified: Secondary | ICD-10-CM

## 2018-09-04 DIAGNOSIS — E1159 Type 2 diabetes mellitus with other circulatory complications: Secondary | ICD-10-CM | POA: Diagnosis not present

## 2018-09-04 DIAGNOSIS — E1129 Type 2 diabetes mellitus with other diabetic kidney complication: Secondary | ICD-10-CM

## 2018-09-04 DIAGNOSIS — R809 Proteinuria, unspecified: Secondary | ICD-10-CM

## 2018-09-04 DIAGNOSIS — E1149 Type 2 diabetes mellitus with other diabetic neurological complication: Secondary | ICD-10-CM | POA: Diagnosis not present

## 2018-09-04 DIAGNOSIS — E1142 Type 2 diabetes mellitus with diabetic polyneuropathy: Secondary | ICD-10-CM | POA: Diagnosis not present

## 2018-09-04 DIAGNOSIS — I10 Essential (primary) hypertension: Secondary | ICD-10-CM | POA: Diagnosis not present

## 2018-09-04 DIAGNOSIS — I739 Peripheral vascular disease, unspecified: Secondary | ICD-10-CM

## 2018-09-04 NOTE — Assessment & Plan Note (Signed)
No longer on ACEI. Check microalbumin.

## 2018-09-04 NOTE — Progress Notes (Signed)
Virtual Visit via Telephone Note   This visit type was conducted due to national recommendations for restrictions regarding the COVID-19 Pandemic (e.g. social distancing) in an effort to limit this patient's exposure and mitigate transmission in our community.  Due to her co-morbid illnesses, this patient is at least at moderate risk for complications without adequate follow up.  This format is felt to be most appropriate for this patient at this time.  The patient did not have access to video technology/had technical difficulties with video requiring transitioning to audio format only (telephone).  All issues noted in this document were discussed and addressed.  No physical exam could be performed with this format.  Please refer to the patient's chart for her  consent to telehealth for [facility].   Date:  09/05/2018   ID:  [name], DOB [birth year], MRN [id number]  Patient Location: Home Provider Location: Home  PCP:  Jinny Sanders, MD  Cardiologist:  Sherren Mocha, MD   Electrophysiologist:  None   Evaluation Performed:  Follow-Up Visit  Chief Complaint:  Follow up on CHF  History of Present Illness:    [name] is a 85 y.o. female with coronary artery disease, congestive heart failure.  In 02/2018 she underwent atherectomy with Impella hemodynamic support and LM bifurcation stenting (she was not a candidate for CABG due to age an poor functional capacity).  She was then admitted in 05/2018 with a STEMI c/b atherectomy with Impella hemodynamic support and LM bifurcation stenting.  She has recently been closely followed due to decompensated congestive heart failure.  She was last seen in the clinic last week with Cecilie Kicks, NP.    Today, she notes she is doing better.  She really has not noticed much shortness of breath lately.  She had not had chest pain, syncope, paroxysmal nocturnal dyspnea.  She has some ankle swelling.  She feels fatigued.    The patient does not  have symptoms concerning for COVID-19 infection (fever, chills, cough, or new shortness of breath).    Past Medical History:  Diagnosis Date  . Anxiety   . Aortic stenosis 02/08/2018   Mod by echo 05/2018: Mean 19 mmHg  . CAD (coronary artery disease)    NSTEMI in 2019 tx medically // [place] in 11/19: critical LM stenosis; CTO of the RCA >> DES to LM/LAD and DES to oLCx (Culotte technique) // s/p STEMI 05/2018 2/2 stent thrombosis c/b CGS >> POBA to LM/LAD and LCx (changed from Plavix to Brilinta); supported with Impella  . Chronic combined systolic and diastolic heart failure ([place]) 02/07/2018   Echo 05/2018: EF 20 // repeat Echo (post PCI) 05/23/2018: EF 50-55, moderate LVH, diastolic dysfunction, mild MAC, moderate MR, trivial AI, mild to moderate aortic stenosis (mean 19)  . Diaphragmatic hernia without mention of obstruction or gangrene   . Diverticulosis of colon (without mention of hemorrhage) 2009  . Fibromyalgia   . GERD (gastroesophageal reflux disease)   . Hiatal hernia 2009   s/p nissen fundoplication 5697.    Marland Kitchen Hx of adenomatous colonic polyps 2007, 2009   Adenomatous polyps in 2007 and 2009.  Hyperplastic polyps in 2009  . Hyperlipidemia   . IBS (irritable bowel syndrome)   . Internal hemorrhoids without mention of complication 9480   Seen at colonoscopy.  . Thyroid disease   . Type II or unspecified type diabetes mellitus with neurological manifestations, not stated as uncontrolled(250.60)    Past Surgical History:  Procedure Laterality  Date  . ABDOMINAL HYSTERECTOMY    . BASAL CELL CARCINOMA EXCISION  04/2016   corner of left eye  . bmd  2007  . CARDIOVERSION N/A 05/19/2018   Procedure: CARDIOVERSION;  Surgeon: Troy Sine, MD;  Location: [facility];  Service: Cardiovascular;  Laterality: N/A;  . CATARACT EXTRACTION    . CORONARY ATHERECTOMY N/A 02/13/2018   Procedure: CORONARY ATHERECTOMY;  Surgeon: Sherren Mocha, MD;  Location: [place] [facility];  Service:  Cardiovascular;  Laterality: N/A;  . CORONARY BALLOON ANGIOPLASTY N/A 05/19/2018   Procedure: CORONARY BALLOON ANGIOPLASTY;  Surgeon: Troy Sine, MD;  Location: [place] [facility];  Service: Cardiovascular;  Laterality: N/A;  . ESOPHAGOGASTRODUODENOSCOPY  2007  . HERNIA REPAIR    . INTRAMEDULLARY (IM) NAIL INTERTROCHANTERIC Right 03/01/2017   Procedure: INTRAMEDULLARY (IM) NAIL INTERTROCHANTRIC;  Surgeon: Hiram Gash, MD;  Location: [place];  Service: Orthopedics;  Laterality: Right;  . LAPAROSCOPIC NISSEN FUNDOPLICATION  2505  . ORIF WRIST FRACTURE Right 03/01/2017   Procedure: OPEN REDUCTION INTERNAL FIXATION (ORIF) WRIST FRACTURE;  Surgeon: Hiram Gash, MD;  Location: [place];  Service: Orthopedics;  Laterality: Right;  . RIGHT/LEFT HEART CATH AND CORONARY ANGIOGRAPHY N/A 02/07/2018   Procedure: RIGHT/LEFT HEART CATH AND CORONARY ANGIOGRAPHY;  Surgeon: Sherren Mocha, MD;  Location: [place] [facility];  Service: Cardiovascular;  Laterality: N/A;  . RIGHT/LEFT HEART CATH AND CORONARY ANGIOGRAPHY N/A 05/19/2018   Procedure: RIGHT/LEFT HEART CATH AND CORONARY ANGIOGRAPHY;  Surgeon: Troy Sine, MD;  Location: [place] [facility];  Service: Cardiovascular;  Laterality: N/A;  . THYROIDECTOMY, PARTIAL    . VENTRICULAR ASSIST DEVICE INSERTION N/A 02/13/2018   Procedure: VENTRICULAR ASSIST DEVICE INSERTION;  Surgeon: Sherren Mocha, MD;  Location: [place] [facility];  Service: Cardiovascular;  Laterality: N/A;  . VENTRICULAR ASSIST DEVICE INSERTION N/A 05/19/2018   Procedure: VENTRICULAR ASSIST DEVICE INSERTION;  Surgeon: Troy Sine, MD;  Location: [place];  Service: Cardiovascular;  Laterality: N/A;     Current Meds  Medication Sig  . acetaminophen (TYLENOL) 500 MG tablet Take 1,000 mg by mouth at bedtime as needed for moderate pain.   Marland Kitchen aspirin 81 MG tablet Take 81 mg by mouth daily.    Marland Kitchen atorvastatin (LIPITOR) 40 MG tablet Take 40 mg by mouth daily.  .  Carboxymethylcellulose Sodium (THERATEARS OP) Place 1 drop into both eyes daily as needed (dry eyes).  . carvedilol (COREG) 6.25 MG tablet Take 6.25 mg by mouth 2 (two) times daily with a meal.  . Insulin Aspart FlexPen 100 UNIT/ML SOPN Inject 10 Units into the skin 3 (three) times daily with meals.  . insulin NPH Human (HUMULIN N,NOVOLIN N) 100 UNIT/ML injection Inject 15 Units into the skin at bedtime.   . nitroGLYCERIN (NITROSTAT) 0.4 MG SL tablet Place 1 tablet (0.4 mg total) under the tongue every 5 (five) minutes as needed.  . ONE TOUCH ULTRA TEST test strip USE 1 STRIP TO CHECK GLUCOSE 4 TIMES DAILY  . pantoprazole (PROTONIX) 40 MG tablet Take 1 tablet (40 mg total) by mouth 2 (two) times daily.  . potassium chloride (K-DUR) 10 MEQ tablet Take 1 tablet (10 mEq total) by mouth 2 (two) times daily.  . ticagrelor (BRILINTA) 90 MG TABS tablet Take 1 tablet (90 mg total) by mouth 2 (two) times daily.  . [DISCONTINUED] furosemide (LASIX) 40 MG tablet Take 40 mg by mouth. Take 1 and 1/2 tabs (60 mg) in AM and  1 tab (40 mg) in PM.     Allergies:   Metformin; Penicillins; Codeine; Fenofibrate; and Pioglitazone   Social History   Tobacco Use  . Smoking status: Former Smoker    Packs/day: 0.25    Years: 4.00    Pack years: 1.00    Types: Cigarettes    Last attempt to quit: 03/10/1981    Years since quitting: 37.5  . Smokeless tobacco: Never Used  Substance Use Topics  . Alcohol use: No    Alcohol/week: 0.0 standard drinks  . Drug use: No     Family Hx: The patient's family history includes Breast cancer in her paternal aunt; Cancer in her mother; Diabetes in her maternal grandfather and son; Heart disease in her paternal grandmother; Hyperlipidemia in her son; Uterine cancer in her mother. There is no history of Colon cancer or Stomach cancer.  ROS:   Please see the history of present illness.     All other systems reviewed and are negative.   Prior CV studies:   The following  studies were reviewed today:  Echo 08/24/2018 EF 50-55, mod LVH, Gr 2 DD, RVSP 41.7, mod MR, trivial AI, mod AS (mean 27), dilated Asc Aorta (38 mm)  Echocardiogram 05/23/2018 EF 50-55, moderate LVH, diastolic dysfunction, mild MAC, moderate MR, trivial AI, mild to moderate aortic stenosis (mean 19)  Limited echocardiogram 05/21/2018 EF 55-60, mild LVH,, mild MAC, mild AI  Echocardiogram 05/19/2018 EF 20, diffuse HK, Impella catheter noted in LV, moderate MR, mild AI, PASP 48  Cardiac catheterization 05/19/2018 LM ostial 95 ISR LAD ostial 80, proximal 60, mid 75 LCx ostial 50 RCA ostial 100 with left-to-right collaterals PCI: Balloon angioplasty to the ostial left main, ostial LAD and ostial LCx    Labs/Other Tests and Data Reviewed:    EKG:  No ECG reviewed.  Recent Labs: 05/19/2018: B Natriuretic Peptide 870.3 08/20/2018: ALT 18; Hemoglobin 11.2; Platelets 329 08/31/2018: BUN 31; Creatinine, Ser 1.34; Magnesium 2.1; NT-Pro BNP 4,085; Potassium 4.1; Sodium 136   Recent Lipid Panel Lab Results  Component Value Date/Time   CHOL 83 05/21/2018 05:07 AM   TRIG 85 05/21/2018 05:07 AM   HDL 33 (L) 05/21/2018 05:07 AM   CHOLHDL 2.5 05/21/2018 05:07 AM   LDLCALC 33 05/21/2018 05:07 AM   LDLDIRECT 101.0 06/23/2016 11:57 AM    Wt Readings from Last 3 Encounters:  09/05/18 132 lb 12.8 oz (60.2 kg)  09/04/18 132 lb 8 oz (60.1 kg)  08/31/18 132 lb (59.9 kg)     Objective:    Vital Signs:  BP (!) 120/58   Pulse 68   Ht 5' 3.5" (1.613 m)   Wt 132 lb 12.8 oz (60.2 kg)   BMI 23.16 kg/m    VITAL SIGNS:  reviewed GEN:  no acute distress RESPIRATORY:  no labored breathing NEURO:  alert and oriented PSYCH:  she is in good spirits  ASSESSMENT & PLAN:    Chronic diastolic heart failure (HCC) EF 72-09 with mod diastolic dysfunction on most recent echo.  She is well compensated now.  I think she can cut back her Lasix to 40 mg twice daily now.  She has labs set up at her PCP  next week.  We will ask for a BMET at the same time to continue to monitor her renal function and potassium.    Coronary artery disease involving native coronary artery of native heart without angina pectoris Hx of MI in 2019 and subsequent complex PCI with LM  bifurcation stenting and acute MI in 05/2018 2/2 stent thrombosis tx with angioplasty.  She is not having angina.  Continue current medicaitons.  COVID-19 Education: The signs and symptoms of COVID-19 were discussed with the patient and how to seek care for testing (follow up with PCP or arrange E-visit).  The importance of social distancing was discussed today.  Time:   Today, I have spent 17.5 minutes with the patient with telehealth technology discussing the above problems.     Medication Adjustments/Labs and Tests Ordered: Current medicines are reviewed at length with the patient today.  Concerns regarding medicines are outlined above.   Tests Ordered: No orders of the defined types were placed in this encounter.   Medication Changes: Meds ordered this encounter  Medications  . furosemide (LASIX) 40 MG tablet    Sig: Take 1 tablet (40 mg total) by mouth 2 (two) times daily.    Dispense:  180 tablet    Refill:  3    Dose decreased    Order Specific Question:   Supervising Provider    Answer:   Lelon Perla [[id number]]    Disposition:  Follow up in 6-8 week(s)  Signed, Richardson Dopp, PA-C  09/05/2018 5:39 PM    [place]

## 2018-09-04 NOTE — Telephone Encounter (Signed)
ATTEMPTED TO CONTACT PT TWICE FOR RESULTS VOICE MAIL NOT CREATED PHONE JUST HUNG UP

## 2018-09-04 NOTE — Assessment & Plan Note (Signed)
PEr recent OV with cardiology .. improved fluid status. ECHO normal last check.

## 2018-09-04 NOTE — Assessment & Plan Note (Signed)
Unclear control.. highs and low. Brittle diabetes.  Pt will set up curbside A1C

## 2018-09-04 NOTE — Assessment & Plan Note (Signed)
Well controlled. Continue current medication.  

## 2018-09-04 NOTE — Assessment & Plan Note (Signed)
Followed by vascular 

## 2018-09-04 NOTE — Progress Notes (Signed)
VIRTUAL VISIT Due to national recommendations of social distancing due to [place] 19, a virtual visit is felt to be most appropriate for this patient at this time.   I connected with the patient on 09/04/18 at  3:15 PM EDT by virtual telehealth platform and verified that I am speaking with the correct person using two identifiers.   Interactive audio and video telecommunications were attempted between this provider and patient, however failed, due to patient having technical difficulties OR patient did not have access to video capability.  We continued and completed visit with audio only.    I discussed the limitations, risks, security and privacy concerns of performing an evaluation and management service by  virtual telehealth platform and the availability of in person appointments. I also discussed with the patient that there may be a patient responsible charge related to this service. The patient expressed understanding and agreed to proceed.  Patient location: Home Provider Location: [facility] Participants: Eliezer Lofts and [name]   Chief Complaint  Patient presents with  . Follow-up    History of Present Illness: 85 year old female presents for follow up DM, HTN  Since last OV she has followed up with Dr. Copper for acute on chronic CHF in 01/2018 Increased lasix to 40 mg daily   R/L cardiac cath 01/28/2018:.  Critical stenosis of the distal left mainstem extending into the proximal LAD and circumflex 2. Chronic total occlusion of the RCA collateralized by right to right and left to right collaterals 3. Moderate segmental LV systolic dysfunction 4. Moderate aortic stenosis with mean transvalvular gradient 16 mmHg and calculated aortic valve area 0.95 cm 5. Reduced cardiac output by Fick measurement Recommended atherectomy and Impella device insertion 02/13/2018 with successful left main bifurcation stenting and adjuvant atherectomy.  Ejection fraction  improved to 55 to 60% on echo 01/25/2018.   Returned to ED on 05/19/2018 with unstable angina, Dx with ST elevation MI   Repeat cath showedrecurrent critical left main stenosis (in-stent restenosis), 95+%. RCA was chronically occluded.  She had Impella placement followed by successful PCI to left main. EF showed recovery by Echo 2/12 s/p revascularization. Course complicated by anemia and thrombocytopenia, both thought to be secondary to Impella and showed improvement once removed. She was transiently on milrinone and NE, both weaned as tolerated. She was transfused with 1 unit PRBCs.  She also developed acute kidney injury which improved prior to discharge.  Changed plavix to brillinta.  Discharged 05/25/18   Most recent cardiology visit 08/31/2018  Lasix decreased to 60 mg daily given diuresis BMET stable   She has no chest pain, just weak overall. Little energy.  She is overdue for re-eval of A1C and microalbumin.  She is taking 10 units with each meal aspart flexpen  15 units of novolog at bedtime. Lab Results  Component Value Date   HGBA1C 6.1 (A) 01/05/2018  Using medications without difficulties: noen Hypoglycemic episodes: occ Hyperglycemic episodes: occ if eating Feet problems: no uclers Blood Sugars averaging: fbs 105-154, occ after meals 190s eye exam within last year: yes   Has been eating cereal and grits  Hypertension:   Stable control.  BP Readings from Last 3 Encounters:  09/04/18 (!) 120/58  08/31/18 (!) 104/52  08/17/18 119/65  Using medication without problems or lightheadedness:  Chest pain with exertion: Edema: Short of breath: Average home BPs: Other issues:   PAD followed by vascular     COVID 19 screen No recent travel or  known exposure to COVID19 The patient denies respiratory symptoms of COVID 19 at this time.  The importance of social distancing was discussed today.   Review of Systems  Constitutional: Positive for malaise/fatigue. Negative  for chills and fever.  HENT: Negative for congestion and ear pain.   Eyes: Negative for pain and redness.  Respiratory: Positive for shortness of breath. Negative for cough.        Improved though from previous  Cardiovascular: Negative for chest pain, palpitations and leg swelling.  Gastrointestinal: Negative for abdominal pain, blood in stool, constipation, diarrhea, nausea and vomiting.  Genitourinary: Negative for dysuria.  Musculoskeletal: Negative for falls and myalgias.  Skin: Negative for rash.  Neurological: Negative for dizziness.  Psychiatric/Behavioral: Negative for depression. The patient is not nervous/anxious.        Denies depression, but feel so tired and like she cannot do anything since heart issues. " I am heading downhill overtime"      Past Medical History:  Diagnosis Date  . Anxiety   . Aortic stenosis 02/08/2018   Mod by echo 05/2018: Mean 19 mmHg  . CAD (coronary artery disease)    NSTEMI in 2019 tx medically // [place] in 11/19: critical LM stenosis; CTO of the RCA >> DES to LM/LAD and DES to oLCx (Culotte technique) // s/p STEMI 05/2018 2/2 stent thrombosis c/b CGS >> POBA to LM/LAD and LCx (changed from Plavix to Brilinta); supported with Impella  . Chronic combined systolic and diastolic heart failure ([place]) 02/07/2018   Echo 05/2018: EF 20 // repeat Echo (post PCI) 05/23/2018: EF 50-55, moderate LVH, diastolic dysfunction, mild MAC, moderate MR, trivial AI, mild to moderate aortic stenosis (mean 19)  . Diaphragmatic hernia without mention of obstruction or gangrene   . Diverticulosis of colon (without mention of hemorrhage) 2009  . Fibromyalgia   . GERD (gastroesophageal reflux disease)   . Hiatal hernia 2009   s/p nissen fundoplication 0272.    Marland Kitchen Hx of adenomatous colonic polyps 2007, 2009   Adenomatous polyps in 2007 and 2009.  Hyperplastic polyps in 2009  . Hyperlipidemia   . IBS (irritable bowel syndrome)   . Internal hemorrhoids without mention of  complication 5366   Seen at colonoscopy.  . Thyroid disease   . Type II or unspecified type diabetes mellitus with neurological manifestations, not stated as uncontrolled(250.60)     reports that she quit smoking about 37 years ago. Her smoking use included cigarettes. She has a 1.00 pack-year smoking history. She has never used smokeless tobacco. She reports that she does not drink alcohol or use drugs.   Current Outpatient Medications:  .  acetaminophen (TYLENOL) 500 MG tablet, Take 1,000 mg by mouth at bedtime as needed for moderate pain. , Disp: , Rfl:  .  aspirin 81 MG tablet, Take 81 mg by mouth daily.  , Disp: , Rfl:  .  atorvastatin (LIPITOR) 40 MG tablet, Take 40 mg by mouth daily., Disp: , Rfl:  .  Carboxymethylcellulose Sodium (THERATEARS OP), Place 1 drop into both eyes daily as needed (dry eyes)., Disp: , Rfl:  .  carvedilol (COREG) 6.25 MG tablet, Take 6.25 mg by mouth 2 (two) times daily with a meal., Disp: , Rfl:  .  furosemide (LASIX) 40 MG tablet, Take 1.5 tablets (60 mg total) by mouth daily., Disp: 135 tablet, Rfl: 3 .  Insulin Aspart FlexPen 100 UNIT/ML SOPN, Inject 10 Units into the skin 3 (three) times daily with meals., Disp: 15 mL, Rfl:  0 .  insulin NPH Human (HUMULIN N,NOVOLIN N) 100 UNIT/ML injection, Inject 15 Units into the skin at bedtime. , Disp: , Rfl:  .  nitroGLYCERIN (NITROSTAT) 0.4 MG SL tablet, Place 1 tablet (0.4 mg total) under the tongue every 5 (five) minutes as needed., Disp: 25 tablet, Rfl: 2 .  ONE TOUCH ULTRA TEST test strip, USE 1 STRIP TO CHECK GLUCOSE 4 TIMES DAILY, Disp: 125 each, Rfl: 11 .  pantoprazole (PROTONIX) 40 MG tablet, Take 1 tablet (40 mg total) by mouth 2 (two) times daily., Disp: 180 tablet, Rfl: 3 .  potassium chloride (K-DUR) 10 MEQ tablet, Take 1 tablet (10 mEq total) by mouth 2 (two) times daily., Disp: 180 tablet, Rfl: 3 .  ticagrelor (BRILINTA) 90 MG TABS tablet, Take 1 tablet (90 mg total) by mouth 2 (two) times daily., Disp: 60  tablet, Rfl: 5   Observations/Objective: Blood pressure (!) 120/58, pulse 68, height 5' 3.5" (1.613 m), weight 132 lb 8 oz (60.1 kg).  Physical Exam  Physical Exam Constitutional:      General: The patient is not in acute distress. Pulmonary:     Effort: Pulmonary effort is normal. No respiratory distress.  Neurological:     Mental Status: The patient is alert and oriented to person, place, and time.  Psychiatric:        Mood and Affect: Mood depressed, voice flat       Behavior: Behavior normal.   Assessment and Plan Microalbuminuria due to type 2 diabetes mellitus No longer on ACEI. Check microalbumin.  Type 2 diabetes mellitus with neurological complications (HCC) Unclear control.. highs and low. Brittle diabetes.  Pt will set up curbside A1C  Diabetic peripheral neuropathy associated with type 2 diabetes mellitus ([place]) Stable control.  PAD (peripheral artery disease) Followed by vascular.  Hypertension complicating diabetes ([place]) Well controlled. Continue current medication.   Chronic combined systolic and diastolic heart failure ([place]) PEr recent OV with cardiology .. improved fluid status. ECHO normal last check.     I discussed the assessment and treatment plan with the patient. The patient was provided an opportunity to ask questions and all were answered. The patient agreed with the plan and demonstrated an understanding of the instructions.   The patient was advised to call back or seek an in-person evaluation if the symptoms worsen or if the condition fails to improve as anticipated.     Eliezer Lofts, MD

## 2018-09-04 NOTE — Assessment & Plan Note (Signed)
Stable control. 

## 2018-09-05 ENCOUNTER — Telehealth: Payer: Self-pay | Admitting: Family Medicine

## 2018-09-05 ENCOUNTER — Encounter: Payer: Self-pay | Admitting: Physician Assistant

## 2018-09-05 ENCOUNTER — Other Ambulatory Visit: Payer: Self-pay

## 2018-09-05 ENCOUNTER — Telehealth (INDEPENDENT_AMBULATORY_CARE_PROVIDER_SITE_OTHER): Payer: PPO | Admitting: Physician Assistant

## 2018-09-05 VITALS — BP 120/58 | HR 68 | Ht 63.5 in | Wt 132.8 lb

## 2018-09-05 DIAGNOSIS — I252 Old myocardial infarction: Secondary | ICD-10-CM | POA: Diagnosis not present

## 2018-09-05 DIAGNOSIS — I5042 Chronic combined systolic (congestive) and diastolic (congestive) heart failure: Secondary | ICD-10-CM

## 2018-09-05 DIAGNOSIS — I5033 Acute on chronic diastolic (congestive) heart failure: Secondary | ICD-10-CM

## 2018-09-05 DIAGNOSIS — I5032 Chronic diastolic (congestive) heart failure: Secondary | ICD-10-CM | POA: Diagnosis not present

## 2018-09-05 DIAGNOSIS — I2510 Atherosclerotic heart disease of native coronary artery without angina pectoris: Secondary | ICD-10-CM

## 2018-09-05 DIAGNOSIS — Z9861 Coronary angioplasty status: Secondary | ICD-10-CM

## 2018-09-05 DIAGNOSIS — Z7189 Other specified counseling: Secondary | ICD-10-CM

## 2018-09-05 MED ORDER — FUROSEMIDE 40 MG PO TABS: 40.0000 mg | ORAL_TABLET | Freq: Two times a day (BID) | ORAL | 3 refills | Status: DC

## 2018-09-05 NOTE — Patient Instructions (Signed)
Medication Instructions:  Decrease Lasix (Furosemide) to 40 mg twice daily   If you need a refill on your cardiac medications before your next appointment, please call your pharmacy.   Lab work: We will arrange to get a BMET at your primary care doctor's office next week.  If you have labs (blood work) drawn today and your tests are completely normal, you will receive your results only by: Marland Kitchen MyChart Message (if you have MyChart) OR . A paper copy in the mail If you have any lab test that is abnormal or we need to change your treatment, we will call you to review the results.  Testing/Procedures: None   Follow-Up: At [facility], you and your health needs are our priority.  As part of our continuing mission to provide you with exceptional heart care, we have created designated Provider Care Teams.  These Care Teams include your primary Cardiologist (physician) and Advanced Practice Providers (APPs -  Physician Assistants and Nurse Practitioners) who all work together to provide you with the care you need, when you need it. You will need a follow up appointment in:  6-8 weeks.  Please call our office 2 months in advance to schedule this appointment.  You may see Sherren Mocha, MD or Richardson Dopp, PA-C   Any Other Special Instructions Will Be Listed Below (If Applicable).  If you feel like your breathing is getting worse or you see that your weight increases suddenly, call us.

## 2018-09-05 NOTE — Telephone Encounter (Signed)
Shana @ [facility]  [phone]  Called wanting to add to labs pt is having on 6/5 BMET for [employer] PA

## 2018-09-05 NOTE — Progress Notes (Signed)
Labs 6/5

## 2018-09-07 ENCOUNTER — Other Ambulatory Visit: Payer: Self-pay | Admitting: Family Medicine

## 2018-09-13 ENCOUNTER — Emergency Department (HOSPITAL_COMMUNITY): Payer: PPO

## 2018-09-13 ENCOUNTER — Encounter (HOSPITAL_COMMUNITY): Payer: Self-pay

## 2018-09-13 ENCOUNTER — Other Ambulatory Visit: Payer: Self-pay

## 2018-09-13 ENCOUNTER — Inpatient Hospital Stay (HOSPITAL_COMMUNITY)
Admission: EM | Admit: 2018-09-13 | Discharge: 2018-09-15 | DRG: 280 | Disposition: A | Discharge status: Home / Self Care | Payer: PPO | Attending: Cardiology | Admitting: Cardiology

## 2018-09-13 DIAGNOSIS — K219 Gastro-esophageal reflux disease without esophagitis: Secondary | ICD-10-CM | POA: Diagnosis not present

## 2018-09-13 DIAGNOSIS — E1142 Type 2 diabetes mellitus with diabetic polyneuropathy: Secondary | ICD-10-CM | POA: Diagnosis not present

## 2018-09-13 DIAGNOSIS — K5730 Diverticulosis of large intestine without perforation or abscess without bleeding: Secondary | ICD-10-CM | POA: Diagnosis not present

## 2018-09-13 DIAGNOSIS — I471 Supraventricular tachycardia, unspecified: Secondary | ICD-10-CM | POA: Diagnosis present

## 2018-09-13 DIAGNOSIS — Z888 Allergy status to other drugs, medicaments and biological substances status: Secondary | ICD-10-CM

## 2018-09-13 DIAGNOSIS — R079 Chest pain, unspecified: Secondary | ICD-10-CM | POA: Diagnosis not present

## 2018-09-13 DIAGNOSIS — Z66 Do not resuscitate: Secondary | ICD-10-CM | POA: Diagnosis present

## 2018-09-13 DIAGNOSIS — E785 Hyperlipidemia, unspecified: Secondary | ICD-10-CM | POA: Diagnosis present

## 2018-09-13 DIAGNOSIS — I214 Non-ST elevation (NSTEMI) myocardial infarction: Principal | ICD-10-CM | POA: Diagnosis present

## 2018-09-13 DIAGNOSIS — T82855A Stenosis of coronary artery stent, initial encounter: Secondary | ICD-10-CM | POA: Diagnosis not present

## 2018-09-13 DIAGNOSIS — I25110 Atherosclerotic heart disease of native coronary artery with unstable angina pectoris: Secondary | ICD-10-CM | POA: Diagnosis present

## 2018-09-13 DIAGNOSIS — Z515 Encounter for palliative care: Secondary | ICD-10-CM | POA: Diagnosis not present

## 2018-09-13 DIAGNOSIS — N183 Chronic kidney disease, stage 3 unspecified: Secondary | ICD-10-CM

## 2018-09-13 DIAGNOSIS — I340 Nonrheumatic mitral (valve) insufficiency: Secondary | ICD-10-CM | POA: Diagnosis not present

## 2018-09-13 DIAGNOSIS — I5033 Acute on chronic diastolic (congestive) heart failure: Secondary | ICD-10-CM | POA: Diagnosis not present

## 2018-09-13 DIAGNOSIS — M797 Fibromyalgia: Secondary | ICD-10-CM | POA: Diagnosis present

## 2018-09-13 DIAGNOSIS — R570 Cardiogenic shock: Secondary | ICD-10-CM | POA: Diagnosis not present

## 2018-09-13 DIAGNOSIS — Z7189 Other specified counseling: Secondary | ICD-10-CM

## 2018-09-13 DIAGNOSIS — Z8249 Family history of ischemic heart disease and other diseases of the circulatory system: Secondary | ICD-10-CM

## 2018-09-13 DIAGNOSIS — E1122 Type 2 diabetes mellitus with diabetic chronic kidney disease: Secondary | ICD-10-CM | POA: Diagnosis not present

## 2018-09-13 DIAGNOSIS — Z885 Allergy status to narcotic agent status: Secondary | ICD-10-CM

## 2018-09-13 DIAGNOSIS — I130 Hypertensive heart and chronic kidney disease with heart failure and stage 1 through stage 4 chronic kidney disease, or unspecified chronic kidney disease: Secondary | ICD-10-CM | POA: Diagnosis not present

## 2018-09-13 DIAGNOSIS — K449 Diaphragmatic hernia without obstruction or gangrene: Secondary | ICD-10-CM | POA: Diagnosis not present

## 2018-09-13 DIAGNOSIS — I350 Nonrheumatic aortic (valve) stenosis: Secondary | ICD-10-CM

## 2018-09-13 DIAGNOSIS — Z20828 Contact with and (suspected) exposure to other viral communicable diseases: Secondary | ICD-10-CM | POA: Diagnosis not present

## 2018-09-13 DIAGNOSIS — R0602 Shortness of breath: Secondary | ICD-10-CM | POA: Diagnosis not present

## 2018-09-13 DIAGNOSIS — I361 Nonrheumatic tricuspid (valve) insufficiency: Secondary | ICD-10-CM | POA: Diagnosis not present

## 2018-09-13 DIAGNOSIS — Z9861 Coronary angioplasty status: Secondary | ICD-10-CM | POA: Diagnosis not present

## 2018-09-13 DIAGNOSIS — I5043 Acute on chronic combined systolic (congestive) and diastolic (congestive) heart failure: Secondary | ICD-10-CM | POA: Diagnosis not present

## 2018-09-13 DIAGNOSIS — I252 Old myocardial infarction: Secondary | ICD-10-CM | POA: Diagnosis not present

## 2018-09-13 DIAGNOSIS — M810 Age-related osteoporosis without current pathological fracture: Secondary | ICD-10-CM | POA: Diagnosis not present

## 2018-09-13 DIAGNOSIS — I083 Combined rheumatic disorders of mitral, aortic and tricuspid valves: Secondary | ICD-10-CM | POA: Diagnosis present

## 2018-09-13 DIAGNOSIS — Z794 Long term (current) use of insulin: Secondary | ICD-10-CM

## 2018-09-13 DIAGNOSIS — Z7982 Long term (current) use of aspirin: Secondary | ICD-10-CM

## 2018-09-13 DIAGNOSIS — I2510 Atherosclerotic heart disease of native coronary artery without angina pectoris: Secondary | ICD-10-CM | POA: Diagnosis not present

## 2018-09-13 DIAGNOSIS — D631 Anemia in chronic kidney disease: Secondary | ICD-10-CM | POA: Diagnosis present

## 2018-09-13 DIAGNOSIS — Z880 Allergy status to penicillin: Secondary | ICD-10-CM

## 2018-09-13 DIAGNOSIS — Z833 Family history of diabetes mellitus: Secondary | ICD-10-CM | POA: Diagnosis not present

## 2018-09-13 DIAGNOSIS — I509 Heart failure, unspecified: Secondary | ICD-10-CM

## 2018-09-13 DIAGNOSIS — Z87891 Personal history of nicotine dependence: Secondary | ICD-10-CM

## 2018-09-13 DIAGNOSIS — I4892 Unspecified atrial flutter: Secondary | ICD-10-CM

## 2018-09-13 DIAGNOSIS — Z7901 Long term (current) use of anticoagulants: Secondary | ICD-10-CM

## 2018-09-13 DIAGNOSIS — R296 Repeated falls: Secondary | ICD-10-CM

## 2018-09-13 DIAGNOSIS — Z8049 Family history of malignant neoplasm of other genital organs: Secondary | ICD-10-CM

## 2018-09-13 DIAGNOSIS — Z803 Family history of malignant neoplasm of breast: Secondary | ICD-10-CM

## 2018-09-13 DIAGNOSIS — R072 Precordial pain: Secondary | ICD-10-CM | POA: Diagnosis not present

## 2018-09-13 DIAGNOSIS — I499 Cardiac arrhythmia, unspecified: Secondary | ICD-10-CM | POA: Diagnosis not present

## 2018-09-13 DIAGNOSIS — D649 Anemia, unspecified: Secondary | ICD-10-CM

## 2018-09-13 HISTORY — DX: Elevated white blood cell count, unspecified: D72.829

## 2018-09-13 HISTORY — DX: Anemia, unspecified: D64.9

## 2018-09-13 HISTORY — DX: Chronic kidney disease, stage 3 unspecified: N18.30

## 2018-09-13 HISTORY — DX: Encounter for palliative care: Z51.5

## 2018-09-13 HISTORY — DX: Cardiogenic shock: R57.0

## 2018-09-13 LAB — BASIC METABOLIC PANEL
Anion gap: 11 (ref 5–15)
BUN: 31 mg/dL — ABNORMAL HIGH (ref 8–23)
CO2: 20 mmol/L — ABNORMAL LOW (ref 22–32)
Calcium: 8.6 mg/dL — ABNORMAL LOW (ref 8.9–10.3)
Chloride: 106 mmol/L (ref 98–111)
Creatinine, Ser: 1.21 mg/dL — ABNORMAL HIGH (ref 0.44–1.00)
GFR calc Af Amer: 47 mL/min — ABNORMAL LOW (ref >60)
GFR calc non Af Amer: 40 mL/min — ABNORMAL LOW (ref >60)
Glucose, Bld: 174 mg/dL — ABNORMAL HIGH (ref 70–99)
Potassium: 3.9 mmol/L (ref 3.5–5.1)
Sodium: 137 mmol/L (ref 135–145)

## 2018-09-13 LAB — SARS CORONAVIRUS 2 BY RT PCR (HOSPITAL ORDER, PERFORMED IN ~~LOC~~ HOSPITAL LAB): SARS Coronavirus 2: NEGATIVE

## 2018-09-13 LAB — CBC
HCT: 33.8 % — ABNORMAL LOW (ref 36.0–46.0)
Hemoglobin: 11 g/dL — ABNORMAL LOW (ref 12.0–15.0)
MCH: 26.9 pg (ref 26.0–34.0)
MCHC: 32.5 g/dL (ref 30.0–36.0)
MCV: 82.6 fL (ref 80.0–100.0)
Platelets: 224 10*3/uL (ref 150–400)
RBC: 4.09 MIL/uL (ref 3.87–5.11)
RDW: 15 % (ref 11.5–15.5)
WBC: 14.3 10*3/uL — ABNORMAL HIGH (ref 4.0–10.5)
nRBC: 0 % (ref 0.0–0.2)

## 2018-09-13 LAB — TROPONIN I
Troponin I: 0.93 ng/mL (ref <0.03)
Troponin I: 2.76 ng/mL (ref <0.03)

## 2018-09-13 LAB — DIGOXIN LEVEL: Digoxin Level: <0.2 ng/mL — ABNORMAL LOW (ref 0.8–2.0)

## 2018-09-13 LAB — BRAIN NATRIURETIC PEPTIDE: B Natriuretic Peptide: 710.8 pg/mL — ABNORMAL HIGH (ref 0.0–100.0)

## 2018-09-13 MED ORDER — ATORVASTATIN CALCIUM 80 MG PO TABS: 80.0000 mg | ORAL_TABLET | Freq: Every day | ORAL | Status: DC

## 2018-09-13 MED ORDER — ASPIRIN 81 MG PO TABS: 81.0000 mg | ORAL_TABLET | Freq: Every day | ORAL | Status: DC

## 2018-09-13 MED ORDER — NITROGLYCERIN 0.4 MG SL SUBL
0.4000 mg | SUBLINGUAL_TABLET | SUBLINGUAL | Status: DC | PRN
  Administered 2018-09-13 (×2): 0.4 mg via SUBLINGUAL
  Filled 2018-09-13: qty 1

## 2018-09-13 MED ORDER — PANTOPRAZOLE SODIUM 40 MG PO TBEC
40.0000 mg | DELAYED_RELEASE_TABLET | Freq: Two times a day (BID) | ORAL | Status: DC
  Administered 2018-09-14 – 2018-09-15 (×3): 40 mg via ORAL
  Filled 2018-09-13 (×4): qty 1

## 2018-09-13 MED ORDER — ONDANSETRON HCL 4 MG/2ML IJ SOLN
4.0000 mg | Freq: Four times a day (QID) | INTRAMUSCULAR | Status: DC | PRN
  Administered 2018-09-14: 4 mg via INTRAVENOUS
  Filled 2018-09-13: qty 2

## 2018-09-13 MED ORDER — NITROGLYCERIN 0.4 MG SL SUBL: 0.4000 mg | SUBLINGUAL_TABLET | SUBLINGUAL | Status: DC | PRN

## 2018-09-13 MED ORDER — TICAGRELOR 90 MG PO TABS
90.0000 mg | ORAL_TABLET | Freq: Two times a day (BID) | ORAL | Status: DC
  Administered 2018-09-14 – 2018-09-15 (×2): 90 mg via ORAL
  Filled 2018-09-13 (×2): qty 1

## 2018-09-13 MED ORDER — ASPIRIN EC 81 MG PO TBEC
81.0000 mg | DELAYED_RELEASE_TABLET | Freq: Every day | ORAL | Status: DC
  Filled 2018-09-13: qty 1

## 2018-09-13 MED ORDER — SODIUM CHLORIDE 0.9 % IV SOLN
INTRAVENOUS | Status: DC
  Administered 2018-09-14: 06:00:00 via INTRAVENOUS

## 2018-09-13 MED ORDER — HEPARIN BOLUS VIA INFUSION
3000.0000 [IU] | Freq: Once | INTRAVENOUS | Status: AC
  Administered 2018-09-13: 3000 [IU] via INTRAVENOUS
  Filled 2018-09-13: qty 3000

## 2018-09-13 MED ORDER — ASPIRIN 81 MG PO CHEW
324.0000 mg | CHEWABLE_TABLET | ORAL | Status: AC
  Administered 2018-09-14: 324 mg via ORAL
  Filled 2018-09-13: qty 4

## 2018-09-13 MED ORDER — TICAGRELOR 90 MG PO TABS: 90.0000 mg | ORAL_TABLET | Freq: Two times a day (BID) | ORAL | Status: DC

## 2018-09-13 MED ORDER — INSULIN ASPART 100 UNIT/ML ~~LOC~~ SOLN: 10.0000 [IU] | Freq: Three times a day (TID) | SUBCUTANEOUS | Status: DC

## 2018-09-13 MED ORDER — INSULIN NPH (HUMAN) (ISOPHANE) 100 UNIT/ML ~~LOC~~ SUSP
15.0000 [IU] | Freq: Every day | SUBCUTANEOUS | Status: DC
  Administered 2018-09-14: 15 [IU] via SUBCUTANEOUS
  Filled 2018-09-13: qty 10

## 2018-09-13 MED ORDER — MORPHINE SULFATE (PF) 4 MG/ML IV SOLN
4.0000 mg | Freq: Once | INTRAVENOUS | Status: AC
  Administered 2018-09-13: 4 mg via INTRAVENOUS
  Filled 2018-09-13: qty 1

## 2018-09-13 MED ORDER — CARVEDILOL 6.25 MG PO TABS: 6.2500 mg | ORAL_TABLET | Freq: Two times a day (BID) | ORAL | Status: DC

## 2018-09-13 MED ORDER — INSULIN ASPART 100 UNIT/ML ~~LOC~~ SOLN
0.0000 [IU] | Freq: Three times a day (TID) | SUBCUTANEOUS | Status: DC
  Administered 2018-09-14: 3 [IU] via SUBCUTANEOUS

## 2018-09-13 MED ORDER — ASPIRIN 300 MG RE SUPP: 300.0000 mg | RECTAL | Status: AC

## 2018-09-13 MED ORDER — ACETAMINOPHEN 325 MG PO TABS: 650.0000 mg | ORAL_TABLET | ORAL | Status: DC | PRN

## 2018-09-13 MED ORDER — HEPARIN (PORCINE) 25000 UT/250ML-% IV SOLN
750.0000 [IU]/h | INTRAVENOUS | Status: DC
  Administered 2018-09-13: 750 [IU]/h via INTRAVENOUS
  Filled 2018-09-13: qty 250

## 2018-09-13 MED ORDER — POTASSIUM CHLORIDE CRYS ER 20 MEQ PO TBCR: 40.0000 meq | EXTENDED_RELEASE_TABLET | Freq: Every day | ORAL | Status: DC

## 2018-09-13 MED ORDER — SODIUM CHLORIDE 0.9 % IV SOLN: 250.0000 mL | INTRAVENOUS | Status: DC | PRN

## 2018-09-13 MED ORDER — SODIUM CHLORIDE 0.9% FLUSH
3.0000 mL | Freq: Two times a day (BID) | INTRAVENOUS | Status: DC
  Administered 2018-09-14 – 2018-09-15 (×3): 3 mL via INTRAVENOUS

## 2018-09-13 MED ORDER — INSULIN ASPART 100 UNIT/ML ~~LOC~~ SOLN: 0.0000 [IU] | Freq: Every day | SUBCUTANEOUS | Status: DC

## 2018-09-13 MED ORDER — SODIUM CHLORIDE 0.9% FLUSH: 3.0000 mL | INTRAVENOUS | Status: DC | PRN

## 2018-09-13 MED ORDER — ASPIRIN 81 MG PO CHEW
81.0000 mg | CHEWABLE_TABLET | ORAL | Status: AC
  Administered 2018-09-14: 81 mg via ORAL
  Filled 2018-09-13: qty 1

## 2018-09-13 MED ORDER — ASPIRIN 81 MG PO CHEW
324.0000 mg | CHEWABLE_TABLET | Freq: Once | ORAL | Status: DC
  Filled 2018-09-13: qty 4

## 2018-09-13 MED ORDER — FUROSEMIDE 10 MG/ML IJ SOLN: 80.0000 mg | Freq: Two times a day (BID) | INTRAMUSCULAR | Status: DC

## 2018-09-13 MED ORDER — FUROSEMIDE 10 MG/ML IJ SOLN
80.0000 mg | Freq: Once | INTRAMUSCULAR | Status: AC
  Administered 2018-09-13: 80 mg via INTRAVENOUS
  Filled 2018-09-13: qty 8

## 2018-09-13 MED ORDER — POTASSIUM CHLORIDE CRYS ER 20 MEQ PO TBCR
40.0000 meq | EXTENDED_RELEASE_TABLET | Freq: Once | ORAL | Status: AC
  Administered 2018-09-13: 40 meq via ORAL
  Filled 2018-09-13: qty 2

## 2018-09-13 NOTE — H&P (Addendum)
PCP:  Jinny Sanders, MD  PCP-Cardiology: Sherren Mocha, MD     Reason for Admission: NSTEMI   HPI:    85 y.o. with a history of coronary artery disease and diastolic heart failure. The patient had non-STEMI and heart failure that occurred during a hospitalization for a hip fracture in 2019. At the time she was not well enough to undergo any invasive cardiac evaluation and she was treated medically. She returned later in the year with ongoing symptoms of chest discomfort, exercise intolerance, weakness, and shortness of breath. She underwent a repeat echocardiogram which showed improvement in LV function. She underwent cardiac catheterization that demonstrated critical left main stenosis and chronic occlusion of the RCA. She was turned down for heart surgery because of advanced age and poor functional capacity. She underwent atherectomy with Impella hemodynamic support and left main bifurcation stenting in November 2019. Her procedure was uncomplicated and she recovered quickly.   Patient returned to the ER with chest pain in 2/20, EKG showed elevation in aVR and diffuse ST depression concerning for global ischemia. EKG patten concerning for critical LM disease. She was given ASA and started on IV heparin. CODE STEMI was activated and pt was taken to the [facility] Cath lab for emergent catheterization.In the cath lab, she was found to have recurrent critical left main stenosis (in-stent restenosis), 95+%.  RCA was chronically occluded.  She had Impella placement followed by successful PCI to left main.  Echo was initially 20% but improved to 50-55% on repeat echo. Impella was removed successfully.   Course recently was complicated by diastolic CHF and Lasix was increased transiently but recently decreased back to 40 mg bid. She had chest heaviness last night transiently but it resolved.  At about 4:30 pm today, she was setting on a sofa and developed recurrent severe (8/10) chest heaviness.   This felt like prior MIs. She came to the ER when the pain did not resolve. Initial troponin in ER 0.93 with BNP 710.  COVID-19 negative.  Chest heaviness resolved with NTG + MSO4.  She is currently chest pain-free.  She has mild orthopnea.  She has been short of breath today walking around her house.  ECG showed lateral and anterolateral ST depression with slight ST elevation in AVR. Changes were not as marked as in 2/20, but different than 5/20 ECG.   Echo (5/20): EF 50-55%, moderate LVH, RV normal, PASP 42 mmHg, moderate MR, moderate aortic stenosis.   Review of Systems: All systems reviewed and negative except as per HPI.   Home Medications Prior to Admission medications   Medication Sig Start Date End Date Taking? Authorizing Provider  acetaminophen (TYLENOL) 500 MG tablet Take 1,000 mg by mouth at bedtime.    Yes [provider]  aspirin 81 MG tablet Take 81 mg by mouth daily.     Yes [provider]  atorvastatin (LIPITOR) 80 MG tablet Take 40 mg by mouth daily at 6 PM.   Yes [provider]  Carboxymethylcellulose Sodium (THERATEARS OP) Place 1 drop into both eyes as needed (for dryness).    Yes [provider]  carvedilol (COREG) 6.25 MG tablet Take 6.25 mg by mouth 2 (two) times daily with a meal. 07/16/18  Yes [provider]  furosemide (LASIX) 40 MG tablet Take 1 tablet (40 mg total) by mouth 2 (two) times daily. Patient taking differently: Take 40-60 mg by mouth See admin instructions. Take 60 mg by mouth two times a  day for 3 days- beginning on 09/12/2018, then decrease to 40 mg two times a day thereafter 09/05/18 09/05/19 Yes Weaver, Scott T, PA-C  insulin NPH Human (NOVOLIN N RELION) 100 UNIT/ML injection Inject 15 Units into the skin at bedtime.    Yes [provider]  nitroGLYCERIN (NITROSTAT) 0.4 MG SL tablet Place 1 tablet (0.4 mg total) under the tongue every 5 (five) minutes as needed. Patient taking differently: Place 0.4 mg  under the tongue every 5 (five) minutes as needed for chest pain.  02/15/18  Yes Reino Bellis B, NP  NOVOLOG FLEXPEN 100 UNIT/ML FlexPen INJECT 10 UNITS INTO THE SKIN  THREE TIMES DAILY WITH MEALS Patient taking differently: Inject 10 Units into the skin 3 (three) times daily before meals.  09/07/18  Yes Bedsole, Amy E, MD  pantoprazole (PROTONIX) 40 MG tablet Take 1 tablet (40 mg total) by mouth 2 (two) times daily. 05/02/18 04/27/19 Yes Sherren Mocha, MD  potassium chloride (K-DUR) 10 MEQ tablet Take 1 tablet (10 mEq total) by mouth 2 (two) times daily. 08/28/18 08/28/19 Yes Weaver, Scott T, PA-C  ticagrelor (BRILINTA) 90 MG TABS tablet Take 1 tablet (90 mg total) by mouth 2 (two) times daily. 05/25/18  Yes Georgiana Shore, NP  ONE TOUCH ULTRA TEST test strip USE 1 STRIP TO CHECK GLUCOSE 4 TIMES DAILY Patient taking differently: 1 each by Other route 4 (four) times daily.  01/26/18   Jinny Sanders, MD    Past Medical History: Past Medical History:  Diagnosis Date   Anxiety    Aortic stenosis 02/08/2018   Mod by echo 05/2018: Mean 19 mmHg   CAD (coronary artery disease)    NSTEMI in 2019 tx medically // [place] in 11/19: critical LM stenosis; CTO of the RCA >> DES to LM/LAD and DES to oLCx ([place] technique) // s/p STEMI 05/2018 2/2 stent thrombosis c/b CGS >> POBA to LM/LAD and LCx (changed from Plavix to Brilinta); supported with Impella   Chronic combined systolic and diastolic heart failure ([place]) 02/07/2018   Echo 05/2018: EF 20 // repeat Echo (post PCI) 05/23/2018: EF 50-55, moderate LVH, diastolic dysfunction, mild MAC, moderate MR, trivial AI, mild to moderate aortic stenosis (mean 19)   Diaphragmatic hernia without mention of obstruction or gangrene    Diverticulosis of colon (without mention of hemorrhage) 2009   Fibromyalgia    GERD (gastroesophageal reflux disease)    Hiatal hernia 2009   s/p nissen fundoplication 1062.     Hx of adenomatous colonic polyps 2007, 2009    Adenomatous polyps in 2007 and 2009.  Hyperplastic polyps in 2009   Hyperlipidemia    IBS (irritable bowel syndrome)    Internal hemorrhoids without mention of complication 6948   Seen at colonoscopy.   Thyroid disease    Type II or unspecified type diabetes mellitus with neurological manifestations, not stated as uncontrolled(250.60)     Past Surgical History: Past Surgical History:  Procedure Laterality Date   ABDOMINAL HYSTERECTOMY     BASAL CELL CARCINOMA EXCISION  04/2016   corner of left eye   bmd  2007   CARDIOVERSION N/A 05/19/2018   Procedure: CARDIOVERSION;  Surgeon: Troy Sine, MD;  Location: [place] [facility];  Service: Cardiovascular;  Laterality: N/A;   CATARACT EXTRACTION     CORONARY ATHERECTOMY N/A 02/13/2018   Procedure: CORONARY ATHERECTOMY;  Surgeon: Sherren Mocha, MD;  Location: [place] [facility];  Service: Cardiovascular;  Laterality: N/A;   CORONARY BALLOON ANGIOPLASTY  N/A 05/19/2018   Procedure: CORONARY BALLOON ANGIOPLASTY;  Surgeon: Troy Sine, MD;  Location: [place] [facility];  Service: Cardiovascular;  Laterality: N/A;   ESOPHAGOGASTRODUODENOSCOPY  2007   HERNIA REPAIR     INTRAMEDULLARY (IM) NAIL INTERTROCHANTERIC Right 03/01/2017   Procedure: INTRAMEDULLARY (IM) NAIL INTERTROCHANTRIC;  Surgeon: Hiram Gash, MD;  Location: [place];  Service: Orthopedics;  Laterality: Right;   LAPAROSCOPIC NISSEN FUNDOPLICATION  0786   ORIF WRIST FRACTURE Right 03/01/2017   Procedure: OPEN REDUCTION INTERNAL FIXATION (ORIF) WRIST FRACTURE;  Surgeon: Hiram Gash, MD;  Location: [place];  Service: Orthopedics;  Laterality: Right;   RIGHT/LEFT HEART CATH AND CORONARY ANGIOGRAPHY N/A 02/07/2018   Procedure: RIGHT/LEFT HEART CATH AND CORONARY ANGIOGRAPHY;  Surgeon: Sherren Mocha, MD;  Location: [place] [facility];  Service: Cardiovascular;  Laterality: N/A;   RIGHT/LEFT HEART CATH AND CORONARY ANGIOGRAPHY N/A 05/19/2018   Procedure: RIGHT/LEFT HEART  CATH AND CORONARY ANGIOGRAPHY;  Surgeon: Troy Sine, MD;  Location: [place] [facility];  Service: Cardiovascular;  Laterality: N/A;   THYROIDECTOMY, PARTIAL     VENTRICULAR ASSIST DEVICE INSERTION N/A 02/13/2018   Procedure: VENTRICULAR ASSIST DEVICE INSERTION;  Surgeon: Sherren Mocha, MD;  Location: [place] [facility];  Service: Cardiovascular;  Laterality: N/A;   VENTRICULAR ASSIST DEVICE INSERTION N/A 05/19/2018   Procedure: VENTRICULAR ASSIST DEVICE INSERTION;  Surgeon: Troy Sine, MD;  Location: [place] [facility];  Service: Cardiovascular;  Laterality: N/A;    Family History:  Family History  Problem Relation Age of Onset   Uterine cancer Mother    Cancer Mother    Diabetes Maternal Grandfather    Diabetes Son    Hyperlipidemia Son    Heart disease Paternal Grandmother    Breast cancer Paternal Aunt    Colon cancer Neg Hx    Stomach cancer Neg Hx     Social History: Social History   Socioeconomic History   Marital status: Widowed    Spouse name: Not on file   Number of children: 3   Years of education: Not on file   Highest education level: Not on file  Occupational History   Occupation: retired    Fish farm manager: RETIRED  Scientist, product/process development strain: Not on file   Food insecurity:    Worry: Not on file    Inability: Not on file   Transportation needs:    Medical: Not on file    Non-medical: Not on file  Tobacco Use   Smoking status: Former Smoker    Packs/day: 0.25    Years: 4.00    Pack years: 1.00    Types: Cigarettes    Last attempt to quit: 03/10/1981    Years since quitting: 37.5   Smokeless tobacco: Never Used  Substance and Sexual Activity   Alcohol use: No    Alcohol/week: 0.0 standard drinks   Drug use: No   Sexual activity: Never  Lifestyle   Physical activity:    Days per week: Not on file    Minutes per session: Not on file   Stress: Not on file  Relationships   Social connections:    Talks  on phone: Not on file    Gets together: Not on file    Attends religious service: Not on file    Active member of club or organization: Not on file    Attends meetings of clubs or organizations: Not on file    Relationship status: Not on file  Other Topics Concern   Not on file  Social History Narrative   Widowed   3 children          Allergies:  Allergies  Allergen Reactions   Metformin Other (See Comments)    Reaction not recalled   Penicillins Other (See Comments)    From childhood- not recalled Has patient had a PCN reaction causing immediate rash, facial/tongue/throat swelling, SOB or lightheadedness with hypotension: Unk Has patient had a PCN reaction causing severe rash involving mucus membranes or skin necrosis: Unk Has patient had a PCN reaction that required hospitalization: No Has patient had a PCN reaction occurring within the last 10 years: No If all of the above answers are "NO", then may proceed with Cephalosporin use.    Adhesive [Tape] Rash   Codeine Palpitations    Rapid heart rate   Fenofibrate Palpitations   Latex Rash    CANNOT USE ANYTHING LATEX   Pioglitazone Rash    Objective:    Vital Signs:   Temp:  [97.6 F (36.4 C)] 97.6 F (36.4 C) (06/04 1854) Pulse Rate:  [90-108] 90 (06/04 2015) Resp:  [19-32] 19 (06/04 2115) BP: (88-127)/(66-87) 101/66 (06/04 2115) SpO2:  [93 %-99 %] 99 % (06/04 2015) Weight:  [60.6 kg] 60.6 kg (06/04 1847)   Filed Weights   09/13/18 1847  Weight: 60.6 kg     Physical Exam     General:  Well appearing. No respiratory difficulty HEENT: Right lid droop.  Neck: Supple. JVP 12 cm. Carotids 2+ bilat; no bruits. No lymphadenopathy or thyromegaly appreciated. Cor: PMI nondisplaced. Regular rate & rhythm. 2/6 HSM apex and RUSB, no S3.  Lungs: Crackles at bases bilaterally.  Abdomen: Soft, nontender, mildly distended. No hepatosplenomegaly. No bruits or masses. Good bowel sounds. Extremities: No cyanosis,  clubbing, rash, edema Neuro: Alert & oriented x 3, cranial nerves grossly intact. moves all 4 extremities w/o difficulty. Affect pleasant.   EKG   NSR, lateral and anterolateral ST depression, there is also slight ST elevation in AVR.  This is new compared to prior ECG but changes are not as marked as in 2/20 (personally reviewed)  Labs     Basic Metabolic Panel: Recent Labs  Lab 09/13/18 1910  NA 137  K 3.9  CL 106  CO2 20*  GLUCOSE 174*  BUN 31*  CREATININE 1.21*  CALCIUM 8.6*    Liver Function Tests: No results for input(s): AST, ALT, ALKPHOS, BILITOT, PROT, ALBUMIN in the last 168 hours. No results for input(s): LIPASE, AMYLASE in the last 168 hours. No results for input(s): AMMONIA in the last 168 hours.  CBC: Recent Labs  Lab 09/13/18 1910  WBC 14.3*  HGB 11.0*  HCT 33.8*  MCV 82.6  PLT 224    Cardiac Enzymes: Recent Labs  Lab 09/13/18 1910  TROPONINI 0.93*    BNP: BNP (last 3 results) Recent Labs    05/19/18 1850 09/13/18 1912  BNP 870.3* 710.8*    ProBNP (last 3 results) Recent Labs    08/20/18 1430 08/28/18 1030 08/31/18 0939  PROBNP 7,089* 4,793* 4,085*     CBG: No results for input(s): GLUCAP in the last 168 hours.  Coagulation Studies: No results for input(s): LABPROT, INR in the last 72 hours.  Imaging: Dg Chest Port 1 View  Result Date: 09/13/2018 CLINICAL DATA:  85 year old female with chest pain since 1630 hours. EXAM: PORTABLE CHEST 1 VIEW COMPARISON:  05/21/2018 and earlier. FINDINGS: Portable AP upright view at 1917 hours.  Stable lung volumes and mediastinal contours. Coarse chronic bilateral pulmonary interstitial opacity. No superimposed pneumothorax, pleural effusion or acute pulmonary opacity. Osteopenia. Stable visualized osseous structures. IMPRESSION: Stable chronic lung disease.  No acute cardiopulmonary abnormality. Electronically Signed   By: Genevie Ann M.D.   On: 09/13/2018 19:39      Assessment/Plan   1.  CAD: Patient presents with NSTEMI, TnI 0.93.  Chest heaviness is similar to prior pain with MIs.  She had NSTEMI in 11/19, requiring Impella-supported atherectomy and left main bifurcation stenting.  She presented with STEMI in 2/20 with 95% left main in-stent restenosis.  She had PCI again to the left main with Impella support.  Her RCA is chronically occluded.  Currently, she is chest pain-free.  - Cycle troponin to peak.  - Continue ASA 81, ticagrelor, and statin.  - Heparin gtt.  - Patient will need repeat cath with NSTEMI and ECG changes.  Will plan for the morning.  Discussed risks/benefits with patient and she agrees to procedure.  2. Acute on chronic diastolic CHF: Last echo in 5/20 with EF 50-55%.  Admission in 2/20 with left main in-stent restenossi was complicated by cardiogenic shock with EF 20%.  She required Impella support and milrinone but was able to be weaned off.  Currently, BP is stable.  She is volume overloaded on exam.  - Lasix 80 mg IV bid, will give a dose now. Replace K.  - Continue home Coreg.  - With NSTEMI, will repeat echo.  3. Valvular heart disease: last echo in 5/20 with moderate MR and moderate AS.  Murmurs noted on exam.  4. Type II diabetes: Cover with SSI.    Loralie Champagne, MD 09/13/2018, 9:42 PM  Advanced Heart Failure Team Pager [phone] (M-F; 7a - 4p)  Please contact [facility] for night-coverage after hours (4p -7a ) and weekends on [email or web address]

## 2018-09-13 NOTE — ED Triage Notes (Signed)
[facility] reports CP beginning at 4:30 PM, she took digitalis. 12 lead shows ST depression. MI 2 mths ago. 108/40 96% 2lpm 90 hr vomited once.

## 2018-09-13 NOTE — ED Provider Notes (Signed)
[facility] EMERGENCY DEPARTMENT Provider Note   CSN: [id number] Arrival date & time: 09/13/18  1841    History   Chief Complaint Chief Complaint  Patient presents with   Chest Pain    HPI [name] is a 85 y.o. female.  She has known history of coronary disease aortic stenosis and CHF.  She said she had some chest pain yesterday that resolved on its own but recurred again today 6 out of 10 intensity substernal associated with shortness of breath nausea vomiting.  She took a "digitalis" tablet which helped somewhat but then the pain recurred.  She has a history of an STEMI and she says this feels similar to her heart attacks.     The history is provided by the patient.  Chest Pain  Pain location:  Substernal area Pain quality: pressure   Pain severity:  Severe Onset quality:  Sudden Timing:  Constant Progression:  Unchanged Chronicity:  Recurrent Relieved by:  Nothing Worsened by:  Nothing Ineffective treatments:  None tried Associated symptoms: nausea and shortness of breath   Associated symptoms: no abdominal pain, no cough, no fever and no headache     Past Medical History:  Diagnosis Date   Anxiety    Aortic stenosis 02/08/2018   Mod by echo 05/2018: Mean 19 mmHg   CAD (coronary artery disease)    NSTEMI in 2019 tx medically // [place] in 11/19: critical LM stenosis; CTO of the RCA >> DES to LM/LAD and DES to oLCx ([place] technique) // s/p STEMI 05/2018 2/2 stent thrombosis c/b CGS >> POBA to LM/LAD and LCx (changed from Plavix to Brilinta); supported with Impella   Chronic combined systolic and diastolic heart failure ([place]) 02/07/2018   Echo 05/2018: EF 20 // repeat Echo (post PCI) 05/23/2018: EF 50-55, moderate LVH, diastolic dysfunction, mild MAC, moderate MR, trivial AI, mild to moderate aortic stenosis (mean 19)   Diaphragmatic hernia without mention of obstruction or gangrene    Diverticulosis of colon (without mention of hemorrhage) 2009     Fibromyalgia    GERD (gastroesophageal reflux disease)    Hiatal hernia 2009   s/p nissen fundoplication 8832.     Hx of adenomatous colonic polyps 2007, 2009   Adenomatous polyps in 2007 and 2009.  Hyperplastic polyps in 2009   Hyperlipidemia    IBS (irritable bowel syndrome)    Internal hemorrhoids without mention of complication 5498   Seen at colonoscopy.   Thyroid disease    Type II or unspecified type diabetes mellitus with neurological manifestations, not stated as uncontrolled(250.60)     Patient Active Problem List   Diagnosis Date Noted   ST elevation myocardial infarction involving left main coronary artery (HCC)    Cardiogenic shock (HCC)    Sustained SVT (HCC)    CAD (coronary artery disease)    PAF (paroxysmal atrial fibrillation) ([place]) 02/08/2018   Aortic stenosis 02/08/2018   Chronic combined systolic and diastolic heart failure ([place]) 02/07/2018   Left main coronary artery disease 02/07/2018   History of hip fracture 01/05/2018   History of non-ST elevation myocardial infarction (NSTEMI) 05/05/2017   Occult blood positive stool    Pressure injury of skin 03/06/2017   Closed right hip fracture ([place]) 03/01/2017   Carotid stenosis, asymptomatic, bilateral 01/12/2017   Osteoporosis 07/19/2016   History of basal cell cancer 07/12/2016   Seborrheic dermatitis 07/12/2016   Allergic rhinitis 07/12/2016   Falls frequently 11/03/2015   Right humeral fracture 11/03/2015  Systolic murmur 03/47/4259   Counseling regarding end of life decision making 02/20/2015   Hypertension complicating diabetes ([place]) 02/20/2015   Microalbuminuria due to type 2 diabetes mellitus ([place]) 11/06/2014   Hematuria 11/04/2014   Hx of partial thyroidectomy 11/04/2014   Chronic constipation 11/04/2014   PAD (peripheral artery disease) ([place]) 12/06/2011   Claudication, intermittent ([place]) 12/06/2011   Chronic total occlusion of artery of the extremities  ([place]) 09/15/2011   Hyperlipidemia 10/08/2007   Diabetic peripheral neuropathy associated with type 2 diabetes mellitus ([place]) 05/11/2007   CALLUSES, FEET, BILATERAL 05/11/2007   Type 2 diabetes mellitus with neurological complications ([place]) 56/38/7564   GERD 09/25/2006   HIATAL HERNIA 09/25/2006    Past Surgical History:  Procedure Laterality Date   ABDOMINAL HYSTERECTOMY     BASAL CELL CARCINOMA EXCISION  04/2016   corner of left eye   bmd  2007   CARDIOVERSION N/A 05/19/2018   Procedure: CARDIOVERSION;  Surgeon: Troy Sine, MD;  Location: [place] [facility];  Service: Cardiovascular;  Laterality: N/A;   CATARACT EXTRACTION     CORONARY ATHERECTOMY N/A 02/13/2018   Procedure: CORONARY ATHERECTOMY;  Surgeon: Sherren Mocha, MD;  Location: [place] [facility];  Service: Cardiovascular;  Laterality: N/A;   CORONARY BALLOON ANGIOPLASTY N/A 05/19/2018   Procedure: CORONARY BALLOON ANGIOPLASTY;  Surgeon: Troy Sine, MD;  Location: [place] [facility];  Service: Cardiovascular;  Laterality: N/A;   ESOPHAGOGASTRODUODENOSCOPY  2007   HERNIA REPAIR     INTRAMEDULLARY (IM) NAIL INTERTROCHANTERIC Right 03/01/2017   Procedure: INTRAMEDULLARY (IM) NAIL INTERTROCHANTRIC;  Surgeon: Hiram Gash, MD;  Location: [place];  Service: Orthopedics;  Laterality: Right;   LAPAROSCOPIC NISSEN FUNDOPLICATION  3329   ORIF WRIST FRACTURE Right 03/01/2017   Procedure: OPEN REDUCTION INTERNAL FIXATION (ORIF) WRIST FRACTURE;  Surgeon: Hiram Gash, MD;  Location: [place];  Service: Orthopedics;  Laterality: Right;   RIGHT/LEFT HEART CATH AND CORONARY ANGIOGRAPHY N/A 02/07/2018   Procedure: RIGHT/LEFT HEART CATH AND CORONARY ANGIOGRAPHY;  Surgeon: Sherren Mocha, MD;  Location: [place] [facility];  Service: Cardiovascular;  Laterality: N/A;   RIGHT/LEFT HEART CATH AND CORONARY ANGIOGRAPHY N/A 05/19/2018   Procedure: RIGHT/LEFT HEART CATH AND CORONARY ANGIOGRAPHY;  Surgeon: Troy Sine, MD;   Location: [place] [facility];  Service: Cardiovascular;  Laterality: N/A;   THYROIDECTOMY, PARTIAL     VENTRICULAR ASSIST DEVICE INSERTION N/A 02/13/2018   Procedure: VENTRICULAR ASSIST DEVICE INSERTION;  Surgeon: Sherren Mocha, MD;  Location: [place] [facility];  Service: Cardiovascular;  Laterality: N/A;   VENTRICULAR ASSIST DEVICE INSERTION N/A 05/19/2018   Procedure: VENTRICULAR ASSIST DEVICE INSERTION;  Surgeon: Troy Sine, MD;  Location: [place] [facility];  Service: Cardiovascular;  Laterality: N/A;     OB History   No obstetric history on file.      Home Medications    Prior to Admission medications   Medication Sig Start Date End Date Taking? Authorizing Provider  acetaminophen (TYLENOL) 500 MG tablet Take 1,000 mg by mouth at bedtime as needed for moderate pain.     [provider]  aspirin 81 MG tablet Take 81 mg by mouth daily.      [provider]  atorvastatin (LIPITOR) 40 MG tablet Take 40 mg by mouth daily.    [provider]  Carboxymethylcellulose Sodium (THERATEARS OP) Place 1 drop into both eyes daily as needed (dry eyes).    [provider]  carvedilol (COREG) 6.25 MG tablet Take  6.25 mg by mouth 2 (two) times daily with a meal. 07/16/18   [provider]  furosemide (LASIX) 40 MG tablet Take 1 tablet (40 mg total) by mouth 2 (two) times daily. 09/05/18 09/05/19  Richardson Dopp T, PA-C  insulin NPH Human (HUMULIN N,NOVOLIN N) 100 UNIT/ML injection Inject 15 Units into the skin at bedtime.     [provider]  nitroGLYCERIN (NITROSTAT) 0.4 MG SL tablet Place 1 tablet (0.4 mg total) under the tongue every 5 (five) minutes as needed. 02/15/18   Cheryln Manly, NP  NOVOLOG FLEXPEN 100 UNIT/ML FlexPen INJECT 10 UNITS INTO THE SKIN  THREE TIMES DAILY WITH MEALS 09/07/18   Bedsole, Amy E, MD  ONE TOUCH ULTRA TEST test strip USE 1 STRIP TO CHECK GLUCOSE 4 TIMES DAILY 01/26/18   Bedsole, Amy E, MD  pantoprazole  (PROTONIX) 40 MG tablet Take 1 tablet (40 mg total) by mouth 2 (two) times daily. 05/02/18 04/27/19  Sherren Mocha, MD  potassium chloride (K-DUR) 10 MEQ tablet Take 1 tablet (10 mEq total) by mouth 2 (two) times daily. 08/28/18 08/28/19  Richardson Dopp T, PA-C  ticagrelor (BRILINTA) 90 MG TABS tablet Take 1 tablet (90 mg total) by mouth 2 (two) times daily. 05/25/18   Georgiana Shore, NP    Family History Family History  Problem Relation Age of Onset   Uterine cancer Mother    Cancer Mother    Diabetes Maternal Grandfather    Diabetes Son    Hyperlipidemia Son    Heart disease Paternal Grandmother    Breast cancer Paternal Aunt    Colon cancer Neg Hx    Stomach cancer Neg Hx     Social History Social History   Tobacco Use   Smoking status: Former Smoker    Packs/day: 0.25    Years: 4.00    Pack years: 1.00    Types: Cigarettes    Last attempt to quit: 03/10/1981    Years since quitting: 37.5   Smokeless tobacco: Never Used  Substance Use Topics   Alcohol use: No    Alcohol/week: 0.0 standard drinks   Drug use: No     Allergies   Metformin; Penicillins; Codeine; Fenofibrate; and Pioglitazone   Review of Systems Review of Systems  Constitutional: Negative for fever.  HENT: Negative for sore throat.   Eyes: Negative for visual disturbance.  Respiratory: Positive for shortness of breath. Negative for cough.   Cardiovascular: Positive for chest pain.  Gastrointestinal: Positive for nausea. Negative for abdominal pain.  Genitourinary: Negative for dysuria.  Musculoskeletal: Negative for neck pain.  Skin: Negative for rash.  Neurological: Negative for headaches.     Physical Exam Updated Vital Signs BP 124/81 (BP Location: Right Arm)    Pulse (!) 103    Temp 97.6 F (36.4 C) (Oral)    Resp (!) 32    Ht _0  (1.6 m)    Wt 60.6 kg    SpO2 96%    BMI 23.67 kg/m   Physical Exam Vitals signs and nursing note reviewed.  Constitutional:      General: She  is not in acute distress.    Appearance: She is well-developed.  HENT:     Head: Normocephalic and atraumatic.  Eyes:     Conjunctiva/sclera: Conjunctivae normal.  Neck:     Musculoskeletal: Neck supple.  Cardiovascular:     Rate and Rhythm: Regular rhythm. Tachycardia present.     Heart sounds: No murmur.  Pulmonary:  Effort: Pulmonary effort is normal. Tachypnea present. No respiratory distress.     Breath sounds: Normal breath sounds. No stridor. No wheezing.  Abdominal:     Palpations: Abdomen is soft.     Tenderness: There is no abdominal tenderness.  Musculoskeletal: Normal range of motion.        General: No tenderness.     Right lower leg: She exhibits no tenderness.     Left lower leg: She exhibits no tenderness.  Skin:    General: Skin is warm and dry.     Capillary Refill: Capillary refill takes less than 2 seconds.  Neurological:     General: No focal deficit present.     Mental Status: She is alert.     GCS: GCS eye subscore is 4. GCS verbal subscore is 5. GCS motor subscore is 6.      ED Treatments / Results  Labs (all labs ordered are listed, but only abnormal results are displayed) Labs Reviewed  BASIC METABOLIC PANEL - Abnormal; Notable for the following components:      Result Value   CO2 20 (*)    Glucose, Bld 174 (*)    BUN 31 (*)    Creatinine, Ser 1.21 (*)    Calcium 8.6 (*)    GFR calc non Af Amer 40 (*)    GFR calc Af Amer 47 (*)    All other components within normal limits  CBC - Abnormal; Notable for the following components:   WBC 14.3 (*)    Hemoglobin 11.0 (*)    HCT 33.8 (*)    All other components within normal limits  TROPONIN I - Abnormal; Notable for the following components:   Troponin I 0.93 (*)    All other components within normal limits  DIGOXIN LEVEL - Abnormal; Notable for the following components:   Digoxin Level <0.2 (*)    All other components within normal limits  BRAIN NATRIURETIC PEPTIDE - Abnormal; Notable for  the following components:   B Natriuretic Peptide 710.8 (*)    All other components within normal limits  CBC - Abnormal; Notable for the following components:   WBC 13.9 (*)    Hemoglobin 10.9 (*)    HCT 33.8 (*)    All other components within normal limits  TROPONIN I - Abnormal; Notable for the following components:   Troponin I 2.76 (*)    All other components within normal limits  TROPONIN I - Abnormal; Notable for the following components:   Troponin I 5.31 (*)    All other components within normal limits  HEMOGLOBIN A1C - Abnormal; Notable for the following components:   Hgb A1c MFr Bld 5.8 (*)    All other components within normal limits  TROPONIN I - Abnormal; Notable for the following components:   Troponin I 3.60 (*)    All other components within normal limits  BASIC METABOLIC PANEL - Abnormal; Notable for the following components:   CO2 20 (*)    Glucose, Bld 113 (*)    BUN 37 (*)    Creatinine, Ser 1.41 (*)    GFR calc non Af Amer 34 (*)    GFR calc Af Amer 39 (*)    All other components within normal limits  GLUCOSE, CAPILLARY - Abnormal; Notable for the following components:   Glucose-Capillary 160 (*)    All other components within normal limits  GLUCOSE, CAPILLARY - Abnormal; Notable for the following components:   Glucose-Capillary 151 (*)  All other components within normal limits  SARS CORONAVIRUS 2 (HOSPITAL ORDER, [place] [facility])  NOVEL CORONAVIRUS, NAA (HOSPITAL ORDER, SEND-OUT TO REF LAB)  HEPARIN LEVEL (UNFRACTIONATED)  LIPID PANEL  TROPONIN I  HEPARIN LEVEL (UNFRACTIONATED)    EKG EKG Interpretation  Date/Time:  Thursday September 13 2018 18:55:24 EDT Ventricular Rate:  104 PR Interval:    QRS Duration: 94 QT Interval:  350 QTC Calculation: 461 R Axis:   15 Text Interpretation:  Age not entered, assumed to be  85 years old for purpose of ECG interpretation Sinus tachycardia Repol abnrm, severe global ischemia (LM/MVD)  new since prior 2/20 Confirmed by Aletta Edouard [phone]) on 09/13/2018 7:06:13 PM   Radiology Dg Chest Port 1 View  Result Date: 09/13/2018 CLINICAL DATA:  85 year old female with chest pain since 1630 hours. EXAM: PORTABLE CHEST 1 VIEW COMPARISON:  05/21/2018 and earlier. FINDINGS: Portable AP upright view at 1917 hours. Stable lung volumes and mediastinal contours. Coarse chronic bilateral pulmonary interstitial opacity. No superimposed pneumothorax, pleural effusion or acute pulmonary opacity. Osteopenia. Stable visualized osseous structures. IMPRESSION: Stable chronic lung disease.  No acute cardiopulmonary abnormality. Electronically Signed   By: Genevie Ann M.D.   On: 09/13/2018 19:39   [place] Ekg Site Rite  Result Date: 09/14/2018 If Site Rite image not attached, placement could not be confirmed due to current cardiac rhythm.   Procedures .Critical Care Performed by: Hayden Rasmussen, MD Authorized by: Hayden Rasmussen, MD   Critical care provider statement:    Critical care time (minutes):  45   Critical care time was exclusive of:  Separately billable procedures and treating other patients   Critical care was necessary to treat or prevent imminent or life-threatening deterioration of the following conditions:  Cardiac failure   Critical care was time spent personally by me on the following activities:  Discussions with consultants, evaluation of patient's response to treatment, examination of patient, ordering and performing treatments and interventions, ordering and review of laboratory studies, ordering and review of radiographic studies, pulse oximetry, re-evaluation of patient's condition, obtaining history from patient or surrogate, review of old charts and development of treatment plan with patient or surrogate   I assumed direction of critical care for this patient from another provider in my specialty: no     (including critical care time)  Medications Ordered in ED Medications    heparin ADULT infusion 100 units/mL (25000 units/259m sodium chloride 0.45%) (750 Units/hr Intravenous Handoff 09/13/18 2358)  atorvastatin (LIPITOR) tablet 80 mg (has no administration in time range)  insulin NPH Human (NOVOLIN N) injection 15 Units (15 Units Subcutaneous Given 09/14/18 0106)  insulin aspart (novoLOG) injection 10 Units (10 Units Subcutaneous Not Given 09/14/18 0822)  pantoprazole (PROTONIX) EC tablet 40 mg (40 mg Oral Given 09/14/18 0028)  aspirin EC tablet 81 mg (has no administration in time range)  nitroGLYCERIN (NITROSTAT) SL tablet 0.4 mg (has no administration in time range)  acetaminophen (TYLENOL) tablet 650 mg (has no administration in time range)  ondansetron (ZOFRAN) injection 4 mg (4 mg Intravenous Given 09/14/18 1011)  ticagrelor (BRILINTA) tablet 90 mg (has no administration in time range)  insulin aspart (novoLOG) injection 0-15 Units (has no administration in time range)  insulin aspart (novoLOG) injection 0-5 Units (0 Units Subcutaneous Not Given 09/14/18 0108)  sodium chloride flush (NS) 0.9 % injection 3 mL (3 mLs Intravenous Not Given 09/14/18 0108)  sodium chloride flush (NS) 0.9 % injection 3 mL (has no administration  in time range)  0.9 %  sodium chloride infusion (has no administration in time range)  0.9 %  sodium chloride infusion ( Intravenous New Bag/Given 09/14/18 0619)  amiodarone (NEXTERONE) 1.8 mg/mL load via infusion 150 mg (150 mg Intravenous Bolus from Bag 09/14/18 0538)    Followed by  amiodarone (NEXTERONE PREMIX) 360-4.14 MG/200ML-% (1.8 mg/mL) IV infusion (60 mg/hr Intravenous New Bag/Given 09/14/18 0958)    Followed by  amiodarone (NEXTERONE PREMIX) 360-4.14 MG/200ML-% (1.8 mg/mL) IV infusion (has no administration in time range)  furosemide (LASIX) injection 80 mg (80 mg Intravenous Given 09/14/18 0959)  furosemide (LASIX) 10 MG/ML injection (has no administration in time range)  morphine 4 MG/ML injection 4 mg (4 mg Intravenous Given 09/13/18 1925)   heparin bolus via infusion 3,000 Units (3,000 Units Intravenous Bolus from Bag 09/13/18 2212)  furosemide (LASIX) injection 80 mg (80 mg Intravenous Given 09/13/18 2251)  potassium chloride SA (K-DUR) CR tablet 40 mEq (40 mEq Oral Given 09/13/18 2251)  aspirin chewable tablet 324 mg (324 mg Oral Given 09/14/18 0028)    Or  aspirin suppository 300 mg ( Rectal See Alternative 09/14/18 0028)  aspirin chewable tablet 81 mg (81 mg Oral Given 09/14/18 0620)     Initial Impression / Assessment and Plan / ED Course  I have reviewed the triage vital signs and the nursing notes.  Pertinent labs & imaging results that were available during my care of the patient were reviewed by me and considered in my medical decision making (see chart for details).  Clinical Course as of Sep 13 1024  Thu Sep 13, 2018  2142 Differential diagnosis includes ACS, CHF, pneumonia, PE.  Troponin markedly elevated and EKG appears ischemic.  I reviewed with cardiology who will admit the patient is asking heparin drip to be started.   [MB]    Clinical Course User Index [MB] Hayden Rasmussen, MD        Final Clinical Impressions(s) / ED Diagnoses   Final diagnoses:  NSTEMI (non-ST elevated myocardial infarction) [facility])    ED Discharge Orders    None       Hayden Rasmussen, MD 09/14/18 1030

## 2018-09-13 NOTE — ED Notes (Signed)
Report attempted 

## 2018-09-13 NOTE — ED Notes (Signed)
ED TO INPATIENT HANDOFF REPORT  ED Nurse Name and Phone #: Benjamine Mola [phone]  S Name/Age/Gender [name] 85 y.o. female Room/Bed: 029C/029C  Code Status   Code Status: Prior  Home/SNF/Other Home Patient oriented to: self, place, time and situation Is this baseline? Yes   Triage Complete: Triage complete  Chief Complaint cp  Triage Note [facility] reports CP beginning at 4:30 PM, she took digitalis. 12 lead shows ST depression. MI 2 mths ago. 108/40 96% 2lpm 90 hr vomited once.    Allergies Allergies  Allergen Reactions  . Metformin Other (See Comments)    Reaction not recalled  . Penicillins Other (See Comments)    From childhood- not recalled Has patient had a PCN reaction causing immediate rash, facial/tongue/throat swelling, SOB or lightheadedness with hypotension: Unk Has patient had a PCN reaction causing severe rash involving mucus membranes or skin necrosis: Unk Has patient had a PCN reaction that required hospitalization: No Has patient had a PCN reaction occurring within the last 10 years: No If all of the above answers are "NO", then may proceed with Cephalosporin use.   . Adhesive [Tape] Rash  . Codeine Palpitations    Rapid heart rate  . Fenofibrate Palpitations  . Latex Rash    CANNOT USE ANYTHING LATEX  . Pioglitazone Rash    Level of Care/Admitting Diagnosis ED Disposition    ED Disposition Condition [facility]: [facility] [[id number]]  Level of Care: Progressive [102]  Covid Evaluation: Confirmed COVID Negative  Diagnosis: NSTEMI (non-ST elevated myocardial infarction) [facility]) [[id number]]  Admitting Physician: Carlyle Dolly  Attending Physician: Larey Dresser [phone]  Estimated length of stay: past midnight tomorrow  Certification:: I certify this patient will need inpatient services for at least 2 midnights  PT Class (Do Not Modify): Inpatient [101]  PT Acc Code (Do Not Modify): Private [1]        B Medical/Surgery History Past Medical History:  Diagnosis Date  . Anxiety   . Aortic stenosis 02/08/2018   Mod by echo 05/2018: Mean 19 mmHg  . CAD (coronary artery disease)    NSTEMI in 2019 tx medically // [place] in 11/19: critical LM stenosis; CTO of the RCA >> DES to LM/LAD and DES to oLCx (Culotte technique) // s/p STEMI 05/2018 2/2 stent thrombosis c/b CGS >> POBA to LM/LAD and LCx (changed from Plavix to Brilinta); supported with Impella  . Chronic combined systolic and diastolic heart failure ([place]) 02/07/2018   Echo 05/2018: EF 20 // repeat Echo (post PCI) 05/23/2018: EF 50-55, moderate LVH, diastolic dysfunction, mild MAC, moderate MR, trivial AI, mild to moderate aortic stenosis (mean 19)  . Diaphragmatic hernia without mention of obstruction or gangrene   . Diverticulosis of colon (without mention of hemorrhage) 2009  . Fibromyalgia   . GERD (gastroesophageal reflux disease)   . Hiatal hernia 2009   s/p nissen fundoplication 8185.    Marland Kitchen Hx of adenomatous colonic polyps 2007, 2009   Adenomatous polyps in 2007 and 2009.  Hyperplastic polyps in 2009  . Hyperlipidemia   . IBS (irritable bowel syndrome)   . Internal hemorrhoids without mention of complication 6314   Seen at colonoscopy.  . Thyroid disease   . Type II or unspecified type diabetes mellitus with neurological manifestations, not stated as uncontrolled(250.60)    Past Surgical History:  Procedure Laterality Date  . ABDOMINAL HYSTERECTOMY    . BASAL CELL CARCINOMA EXCISION  04/2016  corner of left eye  . bmd  2007  . CARDIOVERSION N/A 05/19/2018   Procedure: CARDIOVERSION;  Surgeon: Troy Sine, MD;  Location: [place] [facility];  Service: Cardiovascular;  Laterality: N/A;  . CATARACT EXTRACTION    . CORONARY ATHERECTOMY N/A 02/13/2018   Procedure: CORONARY ATHERECTOMY;  Surgeon: Sherren Mocha, MD;  Location: [place] [facility];  Service: Cardiovascular;  Laterality: N/A;  . CORONARY BALLOON ANGIOPLASTY N/A  05/19/2018   Procedure: CORONARY BALLOON ANGIOPLASTY;  Surgeon: Troy Sine, MD;  Location: [place] [facility];  Service: Cardiovascular;  Laterality: N/A;  . ESOPHAGOGASTRODUODENOSCOPY  2007  . HERNIA REPAIR    . INTRAMEDULLARY (IM) NAIL INTERTROCHANTERIC Right 03/01/2017   Procedure: INTRAMEDULLARY (IM) NAIL INTERTROCHANTRIC;  Surgeon: Hiram Gash, MD;  Location: [place];  Service: Orthopedics;  Laterality: Right;  . LAPAROSCOPIC NISSEN FUNDOPLICATION  8250  . ORIF WRIST FRACTURE Right 03/01/2017   Procedure: OPEN REDUCTION INTERNAL FIXATION (ORIF) WRIST FRACTURE;  Surgeon: Hiram Gash, MD;  Location: [place];  Service: Orthopedics;  Laterality: Right;  . RIGHT/LEFT HEART CATH AND CORONARY ANGIOGRAPHY N/A 02/07/2018   Procedure: RIGHT/LEFT HEART CATH AND CORONARY ANGIOGRAPHY;  Surgeon: Sherren Mocha, MD;  Location: [facility] [facility];  Service: Cardiovascular;  Laterality: N/A;  . RIGHT/LEFT HEART CATH AND CORONARY ANGIOGRAPHY N/A 05/19/2018   Procedure: RIGHT/LEFT HEART CATH AND CORONARY ANGIOGRAPHY;  Surgeon: Troy Sine, MD;  Location: [place] [facility];  Service: Cardiovascular;  Laterality: N/A;  . THYROIDECTOMY, PARTIAL    . VENTRICULAR ASSIST DEVICE INSERTION N/A 02/13/2018   Procedure: VENTRICULAR ASSIST DEVICE INSERTION;  Surgeon: Sherren Mocha, MD;  Location: [place] [facility];  Service: Cardiovascular;  Laterality: N/A;  . VENTRICULAR ASSIST DEVICE INSERTION N/A 05/19/2018   Procedure: VENTRICULAR ASSIST DEVICE INSERTION;  Surgeon: Troy Sine, MD;  Location: [place] [facility];  Service: Cardiovascular;  Laterality: N/A;     A IV Location/Drains/Wounds Patient Lines/Drains/Airways Status   Active Line/Drains/Airways    Name:   Placement date:   Placement time:   Site:   Days:   Peripheral IV 09/13/18 Right Arm   09/13/18    1849    Arm   less than 1   External Urinary Catheter   05/22/18    2030    -   114   Incision (Closed) 03/01/17 Hip Right   03/01/17    0948      561   Incision (Closed) 03/01/17 Wrist Right   03/01/17    1107     561   Pressure Injury 03/05/17 Stage II -  Partial thickness loss of dermis presenting as a shallow open ulcer with a red, pink wound bed without slough.   03/05/17    2339     557   Pressure Injury 03/06/17 Deep Tissue Injury - Purple or maroon localized area of discolored intact skin or blood-filled blister due to damage of underlying soft tissue from pressure and/or shear. WOC assessment K. Sanders 03/06/17   03/06/17    1444     556   Pressure Injury 03/15/17 Stage I -  Intact skin with non-blanchable redness of a localized area usually over a bony prominence. noted in weekly rounds 03/15/17   03/15/17    0914     547          Intake/Output Last 24 hours No intake or output data in the 24 hours ending 09/13/18 2241  Labs/Imaging Results for orders  placed or performed during the hospital encounter of 09/13/18 (from the past 48 hour(s))  Basic metabolic panel     Status: Abnormal   Collection Time: 09/13/18  7:10 PM  Result Value Ref Range   Sodium 137 135 - 145 mmol/L   Potassium 3.9 3.5 - 5.1 mmol/L   Chloride 106 98 - 111 mmol/L   CO2 20 (L) 22 - 32 mmol/L   Glucose, Bld 174 (H) 70 - 99 mg/dL   BUN 31 (H) 8 - 23 mg/dL   Creatinine, Ser 1.21 (H) 0.44 - 1.00 mg/dL   Calcium 8.6 (L) 8.9 - 10.3 mg/dL   GFR calc non Af Amer 40 (L) >60 mL/min   GFR calc Af Amer 47 (L) >60 mL/min   Anion gap 11 5 - 15    Comment: Performed at [facility], [address], [place], [place] [address]  CBC     Status: Abnormal   Collection Time: 09/13/18  7:10 PM  Result Value Ref Range   WBC 14.3 (H) 4.0 - 10.5 K/uL   RBC 4.09 3.87 - 5.11 MIL/uL   Hemoglobin 11.0 (L) 12.0 - 15.0 g/dL   HCT 33.8 (L) 36.0 - 46.0 %   MCV 82.6 80.0 - 100.0 fL   MCH 26.9 26.0 - 34.0 pg   MCHC 32.5 30.0 - 36.0 g/dL   RDW 15.0 11.5 - 15.5 %   Platelets 224 150 - 400 K/uL   nRBC 0.0 0.0 - 0.2 %    Comment: Performed at [facility],  [place] [address], [place], [place] [address]  Troponin I - Once     Status: Abnormal   Collection Time: 09/13/18  7:10 PM  Result Value Ref Range   Troponin I 0.93 (HH) <0.03 ng/mL    Comment: CRITICAL RESULT CALLED TO, READ BACK BY AND VERIFIED WITH: Bonney Aid 2021 09/13/2018 WBOND Performed at [facility], [place] [address], [place], [place] [address]   Digoxin level     Status: Abnormal   Collection Time: 09/13/18  7:10 PM  Result Value Ref Range   Digoxin Level <0.2 (L) 0.8 - 2.0 ng/mL    Comment: RESULTS CONFIRMED BY MANUAL DILUTION Performed at [facility], [place] [address] [place], [place] [address]   Brain natriuretic peptide     Status: Abnormal   Collection Time: 09/13/18  7:12 PM  Result Value Ref Range   B Natriuretic Peptide 710.8 (H) 0.0 - 100.0 pg/mL    Comment: Performed at [facility] [address], [place], [place] [address]  SARS Coronavirus 2 (CEPHEID - Performed in [place] [facility] hospital lab), Hosp Order     Status: None   Collection Time: 09/13/18  7:49 PM  Result Value Ref Range   SARS Coronavirus 2 NEGATIVE NEGATIVE    Comment: (NOTE) If result is NEGATIVE SARS-CoV-2 target nucleic acids are NOT DETECTED. The SARS-CoV-2 RNA is generally detectable in upper and lower  respiratory specimens during the acute phase of infection. The lowest  concentration of SARS-CoV-2 viral copies this assay can detect is 250  copies / mL. A negative result does not preclude SARS-CoV-2 infection  and should not be used as the sole basis for treatment or other  patient management decisions.  A negative result may occur with  improper specimen collection / handling, submission of specimen other  than nasopharyngeal swab, presence of viral mutation(s) within the  areas targeted by this assay, and  inadequate number of viral copies  (<250 copies / mL). A negative result must be combined with clinical  observations, patient history, and epidemiological information. If  result is POSITIVE SARS-CoV-2 target nucleic acids are DETECTED. The SARS-CoV-2 RNA is generally detectable in upper and lower  respiratory specimens dur ing the acute phase of infection.  Positive  results are indicative of active infection with SARS-CoV-2.  Clinical  correlation with patient history and other diagnostic information is  necessary to determine patient infection status.  Positive results do  not rule out bacterial infection or co-infection with other viruses. If result is PRESUMPTIVE POSTIVE SARS-CoV-2 nucleic acids MAY BE PRESENT.   A presumptive positive result was obtained on the submitted specimen  and confirmed on repeat testing.  While 2019 novel coronavirus  (SARS-CoV-2) nucleic acids may be present in the submitted sample  additional confirmatory testing may be necessary for epidemiological  and / or clinical management purposes  to differentiate between  SARS-CoV-2 and other Sarbecovirus currently known to infect humans.  If clinically indicated additional testing with an alternate test  methodology [phone]) is advised. The SARS-CoV-2 RNA is generally  detectable in upper and lower respiratory sp ecimens during the acute  phase of infection. The expected result is Negative. Fact Sheet for Patients:  StrictlyIdeas.no Fact Sheet for Healthcare Providers: BankingDealers.co.za This test is not yet approved or cleared by the [place] FDA and has been authorized for detection and/or diagnosis of SARS-CoV-2 by FDA under an Emergency Use Authorization (EUA).  This EUA will remain in effect (meaning this test can be used) for the duration of the COVID-19 declaration under Section 564(b)(1) of the Act, 21 U.S.C. section 360bbb-3(b)(1), unless the authorization is terminated or revoked sooner. Performed at [facility], [place] [address], [place], [place] [address]    Dg Chest Port 1 View  Result Date:  09/13/2018 CLINICAL DATA:  85 year old female with chest pain since 1630 hours. EXAM: PORTABLE CHEST 1 VIEW COMPARISON:  05/21/2018 and earlier. FINDINGS: Portable AP upright view at 1917 hours. Stable lung volumes and mediastinal contours. Coarse chronic bilateral pulmonary interstitial opacity. No superimposed pneumothorax, pleural effusion or acute pulmonary opacity. Osteopenia. Stable visualized osseous structures. IMPRESSION: Stable chronic lung disease.  No acute cardiopulmonary abnormality. Electronically Signed   By: Genevie Ann M.D.   On: 09/13/2018 19:39    Pending Labs Unresulted Labs (From admission, onward)    Start     Ordered   09/15/18 0500  Heparin level (unfractionated)  Daily,   R     09/13/18 2133   09/14/18 0500  Heparin level (unfractionated)  Tomorrow morning,   R     09/13/18 2133   09/14/18 0500  CBC  Daily,   R     09/13/18 2133   09/14/18 0500  Hemoglobin A1c  Tomorrow morning,   R    Comments:  To assess prior glycemic control    09/13/18 2238   09/13/18 2200  Troponin I - Now Then Q6H  Now then every 6 hours,   R     09/13/18 2200   Signed and Held  Troponin I - Now Then Q6H  Now then every 6 hours,   STAT     Signed and Held   Signed and Held  Basic metabolic panel  Tomorrow morning,   R     Signed and Held   Signed and Held  Lipid panel  Tomorrow morning,   R  Signed and Held   Signed and Held  CBC  Tomorrow morning,   R     Signed and Held   Signed and Held  Novel Coronavirus, NAA (hospital order; send-out to ref lab)  Once,   R    Comments:  No isolation needed for this testing (if isolation ordered for another indication, maintain current isolation).   Question:  Pre-procedural testing  Answer:  Yes   Signed and Held          Vitals/Pain Today's Vitals   09/13/18 2145 09/13/18 2200 09/13/18 2213 09/13/18 2215  BP: 103/68 98/70  113/78  Pulse: 90 88  98  Resp: _0 Temp:      TempSrc:      SpO2: 98% 98%  99%  Weight:      Height:       PainSc:        Isolation Precautions No active isolations  Medications Medications  aspirin chewable tablet 324 mg (324 mg Oral Not Given 09/13/18 1929)  nitroGLYCERIN (NITROSTAT) SL tablet 0.4 mg (0.4 mg Sublingual Given 09/13/18 1930)  heparin ADULT infusion 100 units/mL (25000 units/241m sodium chloride 0.45%) (750 Units/hr Intravenous New Bag/Given 09/13/18 2212)  furosemide (LASIX) injection 80 mg (has no administration in time range)  potassium chloride SA (K-DUR) CR tablet 40 mEq (has no administration in time range)  insulin aspart (novoLOG) injection 0-15 Units (has no administration in time range)  insulin aspart (novoLOG) injection 0-5 Units (has no administration in time range)  sodium chloride flush (NS) 0.9 % injection 3 mL (has no administration in time range)  morphine 4 MG/ML injection 4 mg (4 mg Intravenous Given 09/13/18 1925)  heparin bolus via infusion 3,000 Units (3,000 Units Intravenous Bolus from Bag 09/13/18 2212)    Mobility walks with person assist Low fall risk   Focused Assessments Cardiac Assessment Handoff:  Cardiac Rhythm: Sinus tachycardia Lab Results  Component Value Date   CKTOTAL 839 (H) 03/04/2017   TROPONINI 0.93 (HH) 09/13/2018   Lab Results  Component Value Date   DDIMER 2.17 (H) 03/05/2017   Does the Patient currently have chest pain? No     R Recommendations: See Admitting Provider Note  Report given to:   Additional Notes:

## 2018-09-13 NOTE — Progress Notes (Signed)
ANTICOAGULATION CONSULT NOTE - Initial Consult  Pharmacy Consult for heparin Indication: chest pain/ACS  Allergies  Allergen Reactions  . Metformin Other (See Comments)    Reaction not recalled  . Penicillins Other (See Comments)    From childhood- not recalled Has patient had a PCN reaction causing immediate rash, facial/tongue/throat swelling, SOB or lightheadedness with hypotension: Unk Has patient had a PCN reaction causing severe rash involving mucus membranes or skin necrosis: Unk Has patient had a PCN reaction that required hospitalization: No Has patient had a PCN reaction occurring within the last 10 years: No If all of the above answers are "NO", then may proceed with Cephalosporin use.   . Adhesive [Tape] Rash  . Codeine Palpitations    Rapid heart rate  . Fenofibrate Palpitations  . Latex Rash    CANNOT USE ANYTHING LATEX  . Pioglitazone Rash    Patient Measurements: Height: _0  (160 cm) Weight: 133 lb 9.6 oz (60.6 kg) IBW/kg (Calculated) : 52.4 HEPARIN DW (KG): 60.6  Vital Signs: Temp: 97.6 F (36.4 C) (06/04 1854) Temp Source: Oral (06/04 1854) BP: 101/66 (06/04 2115) Pulse Rate: 90 (06/04 2015)  Labs: Recent Labs    09/13/18 1910  HGB 11.0*  HCT 33.8*  PLT 224  CREATININE 1.21*  TROPONINI 0.93*    Estimated Creatinine Clearance: 27.6 mL/min (A) (by C-G formula based on SCr of 1.21 mg/dL (H)).   Medical History: Past Medical History:  Diagnosis Date  . Anxiety   . Aortic stenosis 02/08/2018   Mod by echo 05/2018: Mean 19 mmHg  . CAD (coronary artery disease)    NSTEMI in 2019 tx medically // [place] in 11/19: critical LM stenosis; CTO of the RCA >> DES to LM/LAD and DES to oLCx (Culotte technique) // s/p STEMI 05/2018 2/2 stent thrombosis c/b CGS >> POBA to LM/LAD and LCx (changed from Plavix to Brilinta); supported with Impella  . Chronic combined systolic and diastolic heart failure ([place]) 02/07/2018   Echo 05/2018: EF 20 // repeat Echo (post  PCI) 05/23/2018: EF 50-55, moderate LVH, diastolic dysfunction, mild MAC, moderate MR, trivial AI, mild to moderate aortic stenosis (mean 19)  . Diaphragmatic hernia without mention of obstruction or gangrene   . Diverticulosis of colon (without mention of hemorrhage) 2009  . Fibromyalgia   . GERD (gastroesophageal reflux disease)   . Hiatal hernia 2009   s/p nissen fundoplication 8875.    Marland Kitchen Hx of adenomatous colonic polyps 2007, 2009   Adenomatous polyps in 2007 and 2009.  Hyperplastic polyps in 2009  . Hyperlipidemia   . IBS (irritable bowel syndrome)   . Internal hemorrhoids without mention of complication 7972   Seen at colonoscopy.  . Thyroid disease   . Type II or unspecified type diabetes mellitus with neurological manifestations, not stated as uncontrolled(250.60)     Assessment: [name] is an 85yo female admitted with ACS. Hx of STEMI/CAD, AS, and CHF. No anticoagulants prior to admission. Hgb 11.0 and pltc 224.  Goal of Therapy:  Heparin level 0.3-0.7 units/ml Monitor platelets by anticoagulation protocol: Yes   Plan:  Heparin bolus 3000 units x1 Start heparin infusion at 750 units/hr  Heparin level at 0500 with AM labs Monitor daily heparin level, CBC, s/sx of bleeding  Thank you for involving pharmacy in this patient's care.  Janae Bridgeman, PharmD PGY1 Pharmacy Resident Phone: [phone] 09/13/2018 9:29 PM

## 2018-09-14 ENCOUNTER — Inpatient Hospital Stay (HOSPITAL_COMMUNITY): Payer: PPO

## 2018-09-14 ENCOUNTER — Inpatient Hospital Stay: Payer: Self-pay

## 2018-09-14 ENCOUNTER — Telehealth: Payer: Self-pay | Admitting: Family Medicine

## 2018-09-14 ENCOUNTER — Encounter (HOSPITAL_COMMUNITY): Payer: Self-pay | Admitting: Interventional Cardiology

## 2018-09-14 ENCOUNTER — Other Ambulatory Visit: Payer: PPO

## 2018-09-14 ENCOUNTER — Encounter (HOSPITAL_COMMUNITY)
Admission: EM | Disposition: A | Discharge status: Home / Self Care | Payer: Self-pay | Source: Home / Self Care | Attending: Cardiology

## 2018-09-14 DIAGNOSIS — I340 Nonrheumatic mitral (valve) insufficiency: Secondary | ICD-10-CM

## 2018-09-14 DIAGNOSIS — Z515 Encounter for palliative care: Secondary | ICD-10-CM

## 2018-09-14 DIAGNOSIS — Z7189 Other specified counseling: Secondary | ICD-10-CM

## 2018-09-14 DIAGNOSIS — I2510 Atherosclerotic heart disease of native coronary artery without angina pectoris: Secondary | ICD-10-CM

## 2018-09-14 DIAGNOSIS — I5043 Acute on chronic combined systolic (congestive) and diastolic (congestive) heart failure: Secondary | ICD-10-CM | POA: Insufficient documentation

## 2018-09-14 DIAGNOSIS — I471 Supraventricular tachycardia: Secondary | ICD-10-CM

## 2018-09-14 DIAGNOSIS — I361 Nonrheumatic tricuspid (valve) insufficiency: Secondary | ICD-10-CM

## 2018-09-14 DIAGNOSIS — R0602 Shortness of breath: Secondary | ICD-10-CM

## 2018-09-14 HISTORY — PX: CORONARY ANGIOGRAPHY: CATH118303

## 2018-09-14 LAB — BASIC METABOLIC PANEL
Anion gap: 15 (ref 5–15)
BUN: 37 mg/dL — ABNORMAL HIGH (ref 8–23)
CO2: 20 mmol/L — ABNORMAL LOW (ref 22–32)
Calcium: 9.4 mg/dL (ref 8.9–10.3)
Chloride: 103 mmol/L (ref 98–111)
Creatinine, Ser: 1.41 mg/dL — ABNORMAL HIGH (ref 0.44–1.00)
GFR calc Af Amer: 39 mL/min — ABNORMAL LOW (ref >60)
GFR calc non Af Amer: 34 mL/min — ABNORMAL LOW (ref >60)
Glucose, Bld: 113 mg/dL — ABNORMAL HIGH (ref 70–99)
Potassium: 5 mmol/L (ref 3.5–5.1)
Sodium: 138 mmol/L (ref 135–145)

## 2018-09-14 LAB — CBC
HCT: 33.8 % — ABNORMAL LOW (ref 36.0–46.0)
Hemoglobin: 10.9 g/dL — ABNORMAL LOW (ref 12.0–15.0)
MCH: 26.4 pg (ref 26.0–34.0)
MCHC: 32.2 g/dL (ref 30.0–36.0)
MCV: 81.8 fL (ref 80.0–100.0)
Platelets: 241 10*3/uL (ref 150–400)
RBC: 4.13 MIL/uL (ref 3.87–5.11)
RDW: 15 % (ref 11.5–15.5)
WBC: 13.9 10*3/uL — ABNORMAL HIGH (ref 4.0–10.5)
nRBC: 0 % (ref 0.0–0.2)

## 2018-09-14 LAB — POCT ACTIVATED CLOTTING TIME: Activated Clotting Time: 142 seconds

## 2018-09-14 LAB — TROPONIN I
Troponin I: 3.6 ng/mL (ref <0.03)
Troponin I: 5.31 ng/mL (ref <0.03)
Troponin I: 6.96 ng/mL (ref <0.03)

## 2018-09-14 LAB — LIPID PANEL
Cholesterol: 109 mg/dL (ref 0–200)
HDL: 42 mg/dL (ref >40)
LDL Cholesterol: 48 mg/dL (ref 0–99)
Total CHOL/HDL Ratio: 2.6 RATIO
Triglycerides: 96 mg/dL (ref <150)
VLDL: 19 mg/dL (ref 0–40)

## 2018-09-14 LAB — ECHOCARDIOGRAM COMPLETE
Height: 63 in
Weight: 2218.71 oz

## 2018-09-14 LAB — GLUCOSE, CAPILLARY
Glucose-Capillary: 151 mg/dL — ABNORMAL HIGH (ref 70–99)
Glucose-Capillary: 160 mg/dL — ABNORMAL HIGH (ref 70–99)
Glucose-Capillary: 197 mg/dL — ABNORMAL HIGH (ref 70–99)

## 2018-09-14 LAB — HEMOGLOBIN A1C
Hgb A1c MFr Bld: 5.8 % — ABNORMAL HIGH (ref 4.8–5.6)
Mean Plasma Glucose: 119.76 mg/dL

## 2018-09-14 LAB — HEPARIN LEVEL (UNFRACTIONATED)
Heparin Unfractionated: 0.4 IU/mL (ref 0.30–0.70)
Heparin Unfractionated: 0.37 IU/mL (ref 0.30–0.70)

## 2018-09-14 SURGERY — CORONARY ANGIOGRAPHY (CATH LAB)
Anesthesia: LOCAL

## 2018-09-14 MED ORDER — PHENAZOPYRIDINE HCL 100 MG PO TABS
100.0000 mg | ORAL_TABLET | Freq: Three times a day (TID) | ORAL | Status: DC
  Administered 2018-09-14 – 2018-09-15 (×4): 100 mg via ORAL
  Filled 2018-09-14 (×5): qty 1

## 2018-09-14 MED ORDER — ACETAMINOPHEN 325 MG PO TABS: 650.0000 mg | ORAL_TABLET | ORAL | Status: DC | PRN

## 2018-09-14 MED ORDER — LORAZEPAM 2 MG/ML PO CONC: 1.0000 mg | ORAL | Status: DC | PRN

## 2018-09-14 MED ORDER — AMIODARONE LOAD VIA INFUSION
150.0000 mg | Freq: Once | INTRAVENOUS | Status: AC
  Administered 2018-09-14: 150 mg via INTRAVENOUS
  Filled 2018-09-14: qty 83.34

## 2018-09-14 MED ORDER — MORPHINE SULFATE (CONCENTRATE) 10 MG/0.5ML PO SOLN: 5.0000 mg | ORAL | Status: DC | PRN

## 2018-09-14 MED ORDER — LIDOCAINE HCL (PF) 1 % IJ SOLN
INTRAMUSCULAR | Status: AC
  Filled 2018-09-14: qty 30

## 2018-09-14 MED ORDER — HYDRALAZINE HCL 20 MG/ML IJ SOLN: 10.0000 mg | INTRAMUSCULAR | Status: AC | PRN

## 2018-09-14 MED ORDER — SENNA 8.6 MG PO TABS
1.0000 | ORAL_TABLET | Freq: Two times a day (BID) | ORAL | Status: DC
  Administered 2018-09-14 – 2018-09-15 (×2): 8.6 mg via ORAL
  Filled 2018-09-14 (×2): qty 1

## 2018-09-14 MED ORDER — SODIUM CHLORIDE 0.9 % IV SOLN: INTRAVENOUS | Status: AC

## 2018-09-14 MED ORDER — AMIODARONE HCL IN DEXTROSE 360-4.14 MG/200ML-% IV SOLN
30.0000 mg/h | INTRAVENOUS | Status: DC
  Filled 2018-09-14: qty 200

## 2018-09-14 MED ORDER — LORAZEPAM 1 MG PO TABS: 1.0000 mg | ORAL_TABLET | ORAL | Status: DC | PRN

## 2018-09-14 MED ORDER — HEPARIN (PORCINE) IN NACL 1000-0.9 UT/500ML-% IV SOLN
INTRAVENOUS | Status: DC | PRN
  Administered 2018-09-14 (×2): 500 mL

## 2018-09-14 MED ORDER — AMIODARONE HCL IN DEXTROSE 360-4.14 MG/200ML-% IV SOLN
60.0000 mg/h | INTRAVENOUS | Status: DC
  Administered 2018-09-14 (×2): 60 mg/h via INTRAVENOUS
  Filled 2018-09-14: qty 200

## 2018-09-14 MED ORDER — FUROSEMIDE 10 MG/ML IJ SOLN
80.0000 mg | Freq: Two times a day (BID) | INTRAMUSCULAR | Status: DC
  Administered 2018-09-14 – 2018-09-15 (×4): 80 mg via INTRAVENOUS
  Filled 2018-09-14 (×3): qty 8

## 2018-09-14 MED ORDER — SODIUM CHLORIDE 0.9% FLUSH: 3.0000 mL | INTRAVENOUS | Status: DC | PRN

## 2018-09-14 MED ORDER — LIDOCAINE HCL (PF) 1 % IJ SOLN
INTRAMUSCULAR | Status: DC | PRN
  Administered 2018-09-14: 15 mL

## 2018-09-14 MED ORDER — LABETALOL HCL 5 MG/ML IV SOLN: 10.0000 mg | INTRAVENOUS | Status: AC | PRN

## 2018-09-14 MED ORDER — IOHEXOL 350 MG/ML SOLN
INTRAVENOUS | Status: DC | PRN
  Administered 2018-09-14: 12:00:00 20 mL via INTRA_ARTERIAL

## 2018-09-14 MED ORDER — HEPARIN (PORCINE) IN NACL 1000-0.9 UT/500ML-% IV SOLN
INTRAVENOUS | Status: AC
  Filled 2018-09-14: qty 1000

## 2018-09-14 MED ORDER — MORPHINE SULFATE (PF) 2 MG/ML IV SOLN
1.0000 mg | INTRAVENOUS | Status: DC | PRN
  Administered 2018-09-14 – 2018-09-15 (×5): 1 mg via INTRAVENOUS
  Filled 2018-09-14 (×5): qty 1

## 2018-09-14 MED ORDER — FUROSEMIDE 10 MG/ML IJ SOLN
INTRAMUSCULAR | Status: AC
  Filled 2018-09-14: qty 8

## 2018-09-14 MED ORDER — HEPARIN SODIUM (PORCINE) 5000 UNIT/ML IJ SOLN: 5000.0000 [IU] | Freq: Three times a day (TID) | INTRAMUSCULAR | Status: DC

## 2018-09-14 MED ORDER — FUROSEMIDE 10 MG/ML IJ SOLN: 80.0000 mg | Freq: Two times a day (BID) | INTRAMUSCULAR | Status: DC

## 2018-09-14 MED ORDER — SODIUM CHLORIDE 0.9% FLUSH
3.0000 mL | Freq: Two times a day (BID) | INTRAVENOUS | Status: DC
  Administered 2018-09-14 – 2018-09-15 (×2): 3 mL via INTRAVENOUS

## 2018-09-14 MED ORDER — LORAZEPAM 2 MG/ML IJ SOLN: 1.0000 mg | INTRAMUSCULAR | Status: DC | PRN

## 2018-09-14 MED ORDER — SODIUM CHLORIDE 0.9 % IV SOLN: 250.0000 mL | INTRAVENOUS | Status: DC | PRN

## 2018-09-14 MED ORDER — FUROSEMIDE 10 MG/ML IJ SOLN: 40.0000 mg | Freq: Once | INTRAMUSCULAR | Status: DC

## 2018-09-14 MED ORDER — MORPHINE SULFATE (CONCENTRATE) 10 MG/0.5ML PO SOLN
5.0000 mg | ORAL | Status: DC | PRN
  Administered 2018-09-15 (×2): 5 mg via SUBLINGUAL
  Filled 2018-09-14 (×2): qty 0.5

## 2018-09-14 MED ORDER — ONDANSETRON HCL 4 MG/2ML IJ SOLN
4.0000 mg | Freq: Four times a day (QID) | INTRAMUSCULAR | Status: DC | PRN
  Administered 2018-09-14: 4 mg via INTRAVENOUS
  Filled 2018-09-14: qty 2

## 2018-09-14 SURGICAL SUPPLY — 12 items
CATH INFINITI 5FR JL4 (CATHETERS) ×1 IMPLANT
CATH SWAN GANZ 7F STRAIGHT (CATHETERS) IMPLANT
COVER DOME SNAP 22 D (MISCELLANEOUS) ×1 IMPLANT
ELECT DEFIB PAD ADLT CADENCE (PAD) ×1 IMPLANT
KIT HEART LEFT (KITS) ×2 IMPLANT
PACK CARDIAC CATHETERIZATION (CUSTOM PROCEDURE TRAY) ×2 IMPLANT
SHEATH PINNACLE 5F 10CM (SHEATH) ×1 IMPLANT
SHEATH PINNACLE 7F 10CM (SHEATH) IMPLANT
SHEATH PROBE COVER 6X72 (BAG) ×1 IMPLANT
TRANSDUCER W/STOPCOCK (MISCELLANEOUS) ×2 IMPLANT
TUBING CIL FLEX 10 FLL-RA (TUBING) ×1 IMPLANT
WIRE EMERALD 3MM-J .035X150CM (WIRE) ×1 IMPLANT

## 2018-09-14 NOTE — Significant Event (Addendum)
Rapid Response Event Note  945 I was rounding and PICC nurse needed to assistance. I went in, per PICC nurse unable to place PICC line, while I was in the room, patient endorsed that she was feeling very short of breath. I sat her up more in the bed and repositioned, she was only able to say a few words at time. Oxygen saturations > 95%, mild increased WOB. Bedside nurse made aware, she contacted the [facility] teams, orders received for Lasix 80 IV mg. SBP > 100 and HR in the 90s.   1015 I was still on the unit, when the nurse came out and said that she is now flushed, nauseated, and more short of breath. STAT EKG done, Zofran IV given, APP paged by nurses, and MD came to bedside.   Plan - proceed to CCL  Pending Cardiac Cath at 1030.   Dameisha Tschida R

## 2018-09-14 NOTE — Consult Note (Signed)
Consultation Note Date: 09/14/2018   Patient Name: [name]  DOB: 09/26/1929  MRN: [id number]  Age / Sex: 85 y.o., female  PCP: Jinny Sanders, MD Referring Physician: Larey Dresser, MD  Reason for Consultation: Establishing goals of care  HPI/Patient Profile: 85 y.o. female  with past medical history of DM, HTN, chronic diastolic CHF, cardiac cath 01/28/18 with impella insertion for stenosis of RCA and LAD, STEMI on 05/19/2018 with impella placement in left main, of  admitted on 09/13/2018 with chest pain, SOB and nausea- found to have NSTEMI, then on 6/5 experienced severe episode of chest pain, nausea and vomiting, SOB- cardiac cath revealed 95% stenosis at bifucation of LM to LAD; 85% ostial LAD, 95% proximal LAD. RCA occluded- she is not candidate fur further stenting or CABG. Cardiology discussed hospice and palliative care with family and consulted palliative medicine for further assistance with GOC and symptom management.   Clinical Assessment and Goals of Care:  I have reviewed medical records including EPIC notes, labs and imaging, received report from Dr. Tamala Julian, assessed the patient and then met at the bedside along with patient, her daughter and daughter's spouse, and son  to discuss diagnosis prognosis, GOC, EOL wishes, disposition and options.  I introduced Palliative Medicine as specialized medical care for people living with serious illness. It focuses on providing relief from the symptoms and stress of a serious illness. The goal is to improve quality of life for both the patient and the family.  We discussed a brief life review of the patient. She is widowed- her husband died in residential hospice at [facility]. Has two children- lives with her son.   As far as functional and nutritional status- prior to admission she and her family both note she has been significantly declining over past  several months. She has had difficulty ambulating from bed to bathroom. Gets SOB with eating, drinking or any amount of activity. She has had weight loss.   We discussed her current illness and what it means in the larger context of her on-going co-morbidities.  Natural disease trajectory and expectations at EOL were discussed. She would like to die at home, not be in pain, not suffer. Her family is concerned that they do not have the ability to care for her at home, however, they are committed to keeping her home for as long as possible and she agrees with transfer to residential Hospice if needs can not be met in the home. Her son and daughter agree they can provide 24 hour care.   I attempted to elicit values and goals of care important to the patient. She values her family, but does not want to be a burden to them.   The difference between aggressive medical intervention and comfort care was explained in light of the patient's goals of care. We discussed stopping medications that are not intended for comfort or that cause discomfort. Patient notes that finger sticks are painful and we agreed that continuing finger sticks and aggressive control  of her blood glucose would not affect her comfort.   Advanced directives, concepts specific to code status, artifical feeding and hydration, and rehospitalization were considered and discussed. All agreed that DNR would be in her best interest, and that avoiding future hospitalization would be important.  Hospice and Palliative Care services outpatient were explained and offered. Family requests hospice services in the home.   Questions and concerns were addressed.  The family was encouraged to call with questions or concerns.   Primary Decision Maker PATIENT with assistance of her son and daughter    Glide -DNR -Patient and family goals are to go home and spend as much time at home as possible before going to residential hospice  -Home with hospice ASAP- care management referral placed -Morphine 65m IV q30 min prn SOB or chest pain  -Morphine 552msublingual every 2 hours prn chest pain or SOB -Lorazepam 75m36mo q4hr prn anxiety or sleep -Allow family to continue to visit- patient at high risk for sudden cardiac death   Prognosis:    < 4 weeks due to NSTEMI, evidence of severe multiple coronary vessel occlusions without possibility of intervention, high risk for sudden cardiac death, plans for transition to comfort   Discharge Planning: Home with Hospice  Primary Diagnoses: Present on Admission: . NSTEMI (non-ST elevated myocardial infarction) (HCC[facility]avana I have reviewed the medical record, interviewed the patient and family, and examined the patient. The following aspects are pertinent.  Past Medical History:  Diagnosis Date  . Anxiety   . Aortic stenosis 02/08/2018   Mod by echo 05/2018: Mean 19 mmHg  . CAD (coronary artery disease)    [place] in 2019 [place] [facility] critical LM stenosis; CTO of the RCA >> DES to LM/LAD and DES to oLCx (C[place]otte technique) // s/p STEMI 05/2018 2/2 stent thrombosis c/b CGS >> POBA to LM/LAD and LCx (changed from Plavix to Brilinta); supported with Impella  . Chronic combined systolic and diastolic heart failure (HCCLucas0/30/2019   Echo 05/2018: EF 20 // repeat Echo (post PCI) 05/23/2018: EF 50-55, moderate LVH, diastolic dysfunction, mild MAC, moderate MR, trivial AI, mild to moderate aortic stenosis (mean 19)  . Diaphragmatic hernia without mention of obstruction or gangrene   . Diverticulosis of colon (without mention of hemorrhage) 2009  . Fibromyalgia   . GERD (gastroesophageal reflux disease)   . Hiatal hernia 2009   s/p nissen fundoplication [id number]  . HMarland Kitchen of adenomatous colonic polyps 2007, 2009   Adenomatous polyps in 2007 and 2009.  Hyperplastic polyps in 2009  . Hyperlipidemia   . IBS (irritable bowel syndrome)   . Internal hemorrhoids without mention of  complication [id number] at colonoscopy.  . Thyroid disease   . Type II or unspecified type diabetes mellitus with neurological manifestations, not stated as uncontrolled(250.60)    Social History   Socioeconomic History  . Marital status: Widowed    Spouse name: Not on file  . Number of children: 3  . Years of education: Not on file  . Highest education level: Not on file  Occupational History  . Occupation: retired    EmpFish farm managerETIRED  Social Needs  . Financial resource strain: Not on file  . Food insecurity:    Worry: Not on file    Inability: Not on file  . Transportation needs:    Medical: Not on file    Non-medical: Not on file  Tobacco Use  .  Smoking status: Former Smoker    Packs/day: 0.25    Years: 4.00    Pack years: 1.00    Types: Cigarettes    Last attempt to quit: 03/10/1981    Years since quitting: 37.5  . Smokeless tobacco: Never Used  Substance and Sexual Activity  . Alcohol use: No    Alcohol/week: 0.0 standard drinks  . Drug use: No  . Sexual activity: Never  Lifestyle  . Physical activity:    Days per week: Not on file    Minutes per session: Not on file  . Stress: Not on file  Relationships  . Social connections:    Talks on phone: Not on file    Gets together: Not on file    Attends religious service: Not on file    Active member of club or organization: Not on file    Attends meetings of clubs or organizations: Not on file    Relationship status: Not on file  Other Topics Concern  . Not on file  Social History Narrative   Widowed   3 children         Family History  Problem Relation Age of Onset  . Uterine cancer Mother   . Cancer Mother   . Diabetes Maternal Grandfather   . Diabetes Son   . Hyperlipidemia Son   . Heart disease Paternal Grandmother   . Breast cancer Paternal Aunt   . Colon cancer Neg Hx   . Stomach cancer Neg Hx    Scheduled Meds: . aspirin EC  81 mg Oral Daily  . atorvastatin  80 mg Oral q1800  .  furosemide  80 mg Intravenous BID  . heparin  5,000 Units Subcutaneous Q8H  . insulin aspart  0-15 Units Subcutaneous TID WC  . insulin aspart  0-5 Units Subcutaneous QHS  . insulin aspart  10 Units Subcutaneous TID WC  . insulin NPH Human  15 Units Subcutaneous QHS  . pantoprazole  40 mg Oral BID  . phenazopyridine  100 mg Oral TID WC  . sodium chloride flush  3 mL Intravenous Q12H  . sodium chloride flush  3 mL Intravenous Q12H  . ticagrelor  90 mg Oral BID   Continuous Infusions: . sodium chloride 50 mL/hr at 09/14/18 1201  . sodium chloride     PRN Meds:.sodium chloride, acetaminophen, hydrALAZINE, labetalol, morphine injection, nitroGLYCERIN, ondansetron (ZOFRAN) IV, sodium chloride flush Medications Prior to Admission:  Prior to Admission medications   Medication Sig Start Date End Date Taking? Authorizing Provider  acetaminophen (TYLENOL) 500 MG tablet Take 1,000 mg by mouth at bedtime.    Yes [provider]  aspirin 81 MG tablet Take 81 mg by mouth daily.     Yes [provider]  atorvastatin (LIPITOR) 80 MG tablet Take 40 mg by mouth daily at 6 PM.   Yes [provider]  Carboxymethylcellulose Sodium (THERATEARS OP) Place 1 drop into both eyes as needed (for dryness).    Yes [provider]  carvedilol (COREG) 6.25 MG tablet Take 6.25 mg by mouth 2 (two) times daily with a meal. 07/16/18  Yes [provider]  furosemide (LASIX) 40 MG tablet Take 1 tablet (40 mg total) by mouth 2 (two) times daily. Patient taking differently: Take 40-60 mg by mouth See admin instructions. Take 60 mg by mouth two times a day for 3 days- beginning on 09/12/2018, then decrease to 40 mg two times a day thereafter 09/05/18 09/05/19 Yes Richardson Dopp T,  PA-C  insulin NPH Human (NOVOLIN N RELION) 100 UNIT/ML injection Inject 15 Units into the skin at bedtime.    Yes [provider]  nitroGLYCERIN (NITROSTAT) 0.4 MG SL tablet Place 1 tablet (0.4 mg total)  under the tongue every 5 (five) minutes as needed. Patient taking differently: Place 0.4 mg under the tongue every 5 (five) minutes as needed for chest pain.  02/15/18  Yes Reino Bellis B, NP  NOVOLOG FLEXPEN 100 UNIT/ML FlexPen INJECT 10 UNITS INTO THE SKIN  THREE TIMES DAILY WITH MEALS Patient taking differently: Inject 10 Units into the skin 3 (three) times daily before meals.  09/07/18  Yes Bedsole, Amy E, MD  pantoprazole (PROTONIX) 40 MG tablet Take 1 tablet (40 mg total) by mouth 2 (two) times daily. 05/02/18 04/27/19 Yes Sherren Mocha, MD  potassium chloride (K-DUR) 10 MEQ tablet Take 1 tablet (10 mEq total) by mouth 2 (two) times daily. 08/28/18 08/28/19 Yes Weaver, Scott T, PA-C  ticagrelor (BRILINTA) 90 MG TABS tablet Take 1 tablet (90 mg total) by mouth 2 (two) times daily. 05/25/18  Yes Georgiana Shore, NP  ONE TOUCH ULTRA TEST test strip USE 1 STRIP TO CHECK GLUCOSE 4 TIMES DAILY Patient taking differently: 1 each by Other route 4 (four) times daily.  01/26/18   Jinny Sanders, MD   Allergies  Allergen Reactions  . Metformin Other (See Comments)    Reaction not recalled  . Penicillins Other (See Comments)    From childhood- not recalled Has patient had a PCN reaction causing immediate rash, facial/tongue/throat swelling, SOB or lightheadedness with hypotension: Unk Has patient had a PCN reaction causing severe rash involving mucus membranes or skin necrosis: Unk Has patient had a PCN reaction that required hospitalization: No Has patient had a PCN reaction occurring within the last 10 years: No If all of the above answers are "NO", then may proceed with Cephalosporin use.   . Adhesive [Tape] Rash  . Codeine Palpitations    Rapid heart rate  . Fenofibrate Palpitations  . Latex Rash    CANNOT USE ANYTHING LATEX  . Pioglitazone Rash   Review of Systems  Constitutional: Positive for activity change and appetite change.  Respiratory: Positive for chest tightness and shortness  of breath.   Gastrointestinal: Positive for nausea.    Physical Exam  Vital Signs: BP 95/68   Pulse (!) 108   Temp (!) 97.5 F (36.4 C) (Oral)   Resp (!) 24   Ht _0  (1.6 m)   Wt 62.9 kg   SpO2 95%   BMI 24.56 kg/m  Pain Scale: 0-10   Pain Score: 1    SpO2: SpO2: 95 % O2 Device:SpO2: 95 % O2 Flow Rate: .O2 Flow Rate (L/min): 4 L/min  IO: Intake/output summary:   Intake/Output Summary (Last 24 hours) at 09/14/2018 1525 Last data filed at 09/14/2018 0530 Gross per 24 hour  Intake 60 ml  Output 700 ml  Net -640 ml    LBM: Last BM Date: 09/13/18 Baseline Weight: Weight: 60.6 kg Most recent weight: Weight: 62.9 kg     Palliative Assessment/Data: PPS: 20%     Thank you for this consult. Palliative medicine will continue to follow and assist as needed.   Time In: 1525 Time Out: 1715 Time Total: 115 minutes Prolonged services billed: yes Greater than 50%  of this time was spent counseling and coordinating care related to the above assessment and plan.  Signed by: Mariana Kaufman, AGNP-C Palliative Medicine  Please contact Palliative Medicine Team phone at [phone] for questions and concerns.  For individual provider: See Shea Evans

## 2018-09-14 NOTE — Progress Notes (Signed)
Paged by staff nurse.   Increased shortness of breath, nausea, and chest pain. EKG and troponin ordered.   Dr Aundra Dubin given update and will assess her.   [name] NP_C 10:17 AM

## 2018-09-14 NOTE — Progress Notes (Signed)
Site area: right groin Site prior to removal: level 0 Pressure applied for: 20 mins, sheath pulled by Tyrone Nine, RT Manual: yes Patient status during pull: stable Post pull site: level 0 Post pull instructions given: yes Post pull pulses present: yes Dressing applied: gauze and tegaderm Bedrest begins: @ 1225

## 2018-09-14 NOTE — Progress Notes (Signed)
  Echocardiogram 2D Echocardiogram has been performed.  [name] 09/14/2018, 2:24 PM

## 2018-09-14 NOTE — Telephone Encounter (Signed)
Neoma Laming (daughter) called to cancel lab appointment today  She state [facility] is [facility] she had a heart attack last night

## 2018-09-14 NOTE — Progress Notes (Signed)
At bedside to place PICC line . Was able to access the basilic vein on first attempt without difficulty and catheter was advanced through introducer with ease . Catheter passed the clavicle but would not turn downward into the SVC. After many attempts to advance catheter downward without  success catheter was removed  Gauze dressing applied and primary RN made aware and will notify MD . Would recommend patient to go to IR for placement of catheter or CVC be placed if needed.

## 2018-09-14 NOTE — Progress Notes (Signed)
Patient ID: [name], female   DOB: 01/01/1930, 85 y.o.   MRN: [id number]  I viewed [name]'s cath images and discussed case with Drs Tamala Julian and Burt Knack.  She has severe 95%+ stenosis at bifurcation of LM to LAD and LCx, 85% ostial LAD, 95% proximal LAD.  RCA known to be occluded.  We agree that her risk of mortality would be excessive for attempting another Impella-protected PCI (would be 3rd at same location in 6 months).  She is not a CABG candidate.    I talked to her and her son about hospice/palliative care.  They are in agreement that this is the best course at this time.  Will ask the palliative care service to consult this afternoon.   Her son and daughter can come visit given end of life situation.   Loralie Champagne 09/14/2018 1:35 PM

## 2018-09-14 NOTE — Progress Notes (Signed)
Patient ID: [name], female   DOB: Oct 31, 1929, 85 y.o.   MRN: [id number]  Patient went into SVT, possibly atypical flutter.  No chest pain but felt short of breath.  Starting amiodarone gtt.    Loralie Champagne 09/14/2018

## 2018-09-14 NOTE — Progress Notes (Signed)
   PICC team unable to place PICC.  Staff nurse reports increased shortness of breath.   Discussed with Dr Aundra Dubin.  Place orderd for IR to placed PICC. Give 80 mg IV lasix now.    Jaron Czarnecki NP-C  9:57 AM

## 2018-09-14 NOTE — Progress Notes (Signed)
Patient ID: [name], female   DOB: July 05, 1929, 85 y.o.   MRN: [id number]     Advanced Heart Failure Rounding Note  PCP-Cardiologist: Sherren Mocha, MD   Subjective:    She had SVT overnight (possibly atypical atrial flutter).  This was transient and resolved with amiodarone gtt. She was short of breath with the SVT but now comfortable with no further chest pain.  BP lower, currently upper 46T-03T systolic.   TnI to 5.3.     Objective:   Weight Range: 62.9 kg Body mass index is 24.56 kg/m.   Vital Signs:   Temp:  [97.6 F (36.4 C)-97.7 F (36.5 C)] 97.7 F (36.5 C) (06/05 0455) Pulse Rate:  [88-143] 143 (06/05 0455) Resp:  [16-32] 25 (06/05 0436) BP: (88-127)/(66-87) 100/74 (06/05 0455) SpO2:  [93 %-100 %] 97 % (06/05 0455) Weight:  [60.6 kg-62.9 kg] 62.9 kg (06/04 2336) Last BM Date: 09/13/18  Weight change: Filed Weights   09/13/18 1847 09/13/18 2336  Weight: 60.6 kg 62.9 kg    Intake/Output:   Intake/Output Summary (Last 24 hours) at 09/14/2018 0728 Last data filed at 09/14/2018 0530 Gross per 24 hour  Intake 60 ml  Output 700 ml  Net -640 ml      Physical Exam    General:  Well appearing. No resp difficulty HEENT: Normal Neck: Supple. JVP 10 cm. Carotids 2+ bilat; no bruits. No lymphadenopathy or thyromegaly appreciated. Cor: PMI nondisplaced. Regular rate & rhythm. No S3.  2/6 HSM apex/RUSB.  Lungs: Crackles at bases.  Abdomen: Soft, nontender, nondistended. No hepatosplenomegaly. No bruits or masses. Good bowel sounds. Extremities: No cyanosis, clubbing, rash, edema Neuro: Alert & orientedx3, cranial nerves grossly intact. moves all 4 extremities w/o difficulty. Affect pleasant   Telemetry   SVT last night, ?atypical flutter rate 130s-140s.  Currently in NSR in 80s.  Personally reviewed.   Labs    CBC Recent Labs    09/13/18 1910 09/14/18 0418  WBC 14.3* 13.9*  HGB 11.0* 10.9*  HCT 33.8* 33.8*  MCV 82.6 81.8  PLT 224 465   Basic  Metabolic Panel Recent Labs    09/13/18 1910 09/14/18 0418  NA 137 138  K 3.9 5.0  CL 106 103  CO2 20* 20*  GLUCOSE 174* 113*  BUN 31* 37*  CREATININE 1.21* 1.41*  CALCIUM 8.6* 9.4   Liver Function Tests No results for input(s): AST, ALT, ALKPHOS, BILITOT, PROT, ALBUMIN in the last 72 hours. No results for input(s): LIPASE, AMYLASE in the last 72 hours. Cardiac Enzymes Recent Labs    09/13/18 2200 09/14/18 0015 09/14/18 0418  TROPONINI 2.76* 3.60* 5.31*    BNP: BNP (last 3 results) Recent Labs    05/19/18 1850 09/13/18 1912  BNP 870.3* 710.8*    ProBNP (last 3 results) Recent Labs    08/20/18 1430 08/28/18 1030 08/31/18 0939  PROBNP 7,089* 6,812* 4,085*     D-Dimer No results for input(s): DDIMER in the last 72 hours. Hemoglobin A1C Recent Labs    09/14/18 0418  HGBA1C 5.8*   Fasting Lipid Panel Recent Labs    09/14/18 0418  CHOL 109  HDL 42  LDLCALC 48  TRIG 96  CHOLHDL 2.6   Thyroid Function Tests No results for input(s): TSH, T4TOTAL, T3FREE, THYROIDAB in the last 72 hours.  Invalid input(s): FREET3  Other results:   Imaging    Dg Chest Port 1 View  Result Date: 09/13/2018 CLINICAL DATA:  85 year old female with chest pain since  1630 hours. EXAM: PORTABLE CHEST 1 VIEW COMPARISON:  05/21/2018 and earlier. FINDINGS: Portable AP upright view at 1917 hours. Stable lung volumes and mediastinal contours. Coarse chronic bilateral pulmonary interstitial opacity. No superimposed pneumothorax, pleural effusion or acute pulmonary opacity. Osteopenia. Stable visualized osseous structures. IMPRESSION: Stable chronic lung disease.  No acute cardiopulmonary abnormality. Electronically Signed   By: Genevie Ann M.D.   On: 09/13/2018 19:39   [place] Ekg Site Rite  Result Date: 09/14/2018 If Site Rite image not attached, placement could not be confirmed due to current cardiac rhythm.     Medications:     Scheduled Medications: . aspirin EC  81 mg Oral  Daily  . atorvastatin  80 mg Oral q1800  . furosemide  40 mg Intravenous Once  . furosemide  80 mg Intravenous BID  . insulin aspart  0-15 Units Subcutaneous TID WC  . insulin aspart  0-5 Units Subcutaneous QHS  . insulin aspart  10 Units Subcutaneous TID WC  . insulin NPH Human  15 Units Subcutaneous QHS  . pantoprazole  40 mg Oral BID  . sodium chloride flush  3 mL Intravenous Q12H  . ticagrelor  90 mg Oral BID     Infusions: . sodium chloride    . sodium chloride 10 mL/hr at 09/14/18 0619  . amiodarone 60 mg/hr (09/14/18 0549)   Followed by  . amiodarone    . heparin 750 Units/hr (09/13/18 2212)     PRN Medications:  sodium chloride, acetaminophen, nitroGLYCERIN, ondansetron (ZOFRAN) IV, sodium chloride flush  Assessment/Plan   1. CAD: Patient presented with NSTEMI, TnI 5.3.  Chest heaviness similar to prior pain with MIs.  She had NSTEMI in 11/19, requiring Impella-supported atherectomy and left main bifurcation stenting.  She presented with STEMI in 2/20 with 95% left main in-stent restenosis.  She had PCI again to the left main with Impella support.  Her RCA is chronically occluded.  Right now, she is chest pain-free.   - Continue ASA 81, ticagrelor, and statin.  - Heparin gtt.  - Will plan for urgent coronary angiography this morning as I have concern for developing cardiogenic shock with softening BP.  2. Acute on chronic ?diastolic CHF: Last echo in 5/20 with EF improved to 50-55%.  Admission in 2/20 with left main in-stent restenossi was complicated by cardiogenic shock with EF 20%.  She required Impella support and milrinone but was able to be weaned off.  She is volume overloaded on exam, had IV Lasix last night.  She was short of breath when in SVT but now comfortable at rest.  Concerning that SBP is lower, upper 80s-90s.  I do have some concern for developing cardiogenic shock.  - With rise in creatinine to 1.4, will hold Lasix this morning until after cath.   - Stop  Coreg with soft BP.   - She needs repeat echo.  - Will need RHC with coronary angio today.  Low threshold for mechanical support if cardiac output low.  I will also order PICC placement to follow CVP and co-ox.  3. Valvular heart disease: last echo in 5/20 with moderate MR and moderate AS.  Murmurs noted on exam.  - Will be getting repeat echo. 4. Type II diabetes: Cover with SSI.  5. SVT: ?atypical atrial flutter.  Now on amiodarone gtt and back in NSR.   - Continue amiodarone gtt for now.   Discussed the above with patient's son by phone.   Length of Stay: 1  Atley Scarboro  Aundra Dubin, MD  09/14/2018, 7:28 AM  Advanced Heart Failure Team Pager [phone] (M-F; 7a - 4p)  Please contact [facility] for night-coverage after hours (4p -7a ) and weekends on [email or web address]

## 2018-09-14 NOTE — CV Procedure (Signed)
   Unstable angina with known prior to stent left main intervention, treated 4 months later with repeat angioplasty for restenosis.  Right femoral coronary angiography using real-time vascular ultrasound guidance.  Recurrent restenosis at this time including the ostium, mid stented segment, ostium of circumflex, and a long high-grade obstruction in the proximal to mid LAD.  Case terminated.  Discussed with family and patient.  DNR status.  Hospice and palliative care consultation.

## 2018-09-14 NOTE — Progress Notes (Signed)
[facility]  Indication: chest pain/ACS  Allergies  Allergen Reactions  . Metformin Other (See Comments)    Reaction not recalled  . Penicillins Other (See Comments)    From childhood- not recalled Has patient had a PCN reaction causing immediate rash, facial/tongue/throat swelling, SOB or lightheadedness with hypotension: Unk Has patient had a PCN reaction causing severe rash involving mucus membranes or skin necrosis: Unk Has patient had a PCN reaction that required hospitalization: No Has patient had a PCN reaction occurring within the last 10 years: No If all of the above answers are "NO", then may proceed with Cephalosporin use.   . Adhesive [Tape] Rash  . Codeine Palpitations    Rapid heart rate  . Fenofibrate Palpitations  . Latex Rash    CANNOT USE ANYTHING LATEX  . Pioglitazone Rash    Patient Measurements: Height: _0  (160 cm) Weight: 138 lb 10.7 oz (62.9 kg) IBW/kg (Calculated) : 52.4 HEPARIN DW (KG): 60.6  Vital Signs: Temp: 97.7 F (36.5 C) (06/05 0455) Temp Source: Oral (06/05 0455) BP: 100/74 (06/05 0455) Pulse Rate: 143 (06/05 0455)  Labs: Recent Labs    09/13/18 1910 09/13/18 2200 09/14/18 0015 09/14/18 0418  HGB 11.0*  --   --  10.9*  HCT 33.8*  --   --  33.8*  PLT 224  --   --  241  HEPARINUNFRC  --   --   --  0.40  CREATININE 1.21*  --   --   --   TROPONINI 0.93* 2.76* 3.60*  --     Estimated Creatinine Clearance: 29.8 mL/min (A) (by C-G formula based on SCr of 1.21 mg/dL (H)).   Medical History: Past Medical History:  Diagnosis Date  . Anxiety   . Aortic stenosis 02/08/2018   Mod by echo 05/2018: Mean 19 mmHg  . CAD (coronary artery disease)    NSTEMI in 2019 tx medically // [place] in 11/19: critical LM stenosis; CTO of the RCA >> DES to LM/LAD and DES to oLCx ([place] technique) // s/p STEMI 05/2018 2/2 stent thrombosis c/b CGS >> POBA to LM/LAD and LCx (changed from Plavix to Brilinta);  supported with Impella  . Chronic combined systolic and diastolic heart failure ([place]) 02/07/2018   Echo 05/2018: EF 20 // repeat Echo (post PCI) 05/23/2018: EF 50-55, moderate LVH, diastolic dysfunction, mild MAC, moderate MR, trivial AI, mild to moderate aortic stenosis (mean 19)  . Diaphragmatic hernia without mention of obstruction or gangrene   . Diverticulosis of colon (without mention of hemorrhage) 2009  . Fibromyalgia   . GERD (gastroesophageal reflux disease)   . Hiatal hernia 2009   s/p nissen fundoplication 9924.    Marland Kitchen Hx of adenomatous colonic polyps 2007, 2009   Adenomatous polyps in 2007 and 2009.  Hyperplastic polyps in 2009  . Hyperlipidemia   . IBS (irritable bowel syndrome)   . Internal hemorrhoids without mention of complication 2683   Seen at colonoscopy.  . Thyroid disease   . Type II or unspecified type diabetes mellitus with neurological manifestations, not stated as uncontrolled(250.60)     Assessment: [name] is an 85yo female admitted with ACS. Hx of STEMI/CAD, AS, and CHF. No anticoagulants prior to admission. Hgb 11.0 and pltc 224.  6/5 AM update: initial heparin level therapeutic, troponin trending up, CBC stable  Goal of Therapy:  Heparin level 0.3-0.7 units/ml Monitor platelets by anticoagulation protocol: Yes   Plan:  Cont heparin infusion at  750 units/hr  Heparin level at 1200 Monitor daily heparin level, CBC, s/sx of bleeding  Narda Bonds, PharmD, BCPS Clinical Pharmacist Phone: [phone]

## 2018-09-15 ENCOUNTER — Telehealth: Payer: Self-pay | Admitting: Internal Medicine

## 2018-09-15 ENCOUNTER — Encounter (HOSPITAL_COMMUNITY): Payer: Self-pay | Admitting: Physician Assistant

## 2018-09-15 DIAGNOSIS — N183 Chronic kidney disease, stage 3 unspecified: Secondary | ICD-10-CM

## 2018-09-15 DIAGNOSIS — I4892 Unspecified atrial flutter: Secondary | ICD-10-CM

## 2018-09-15 DIAGNOSIS — D649 Anemia, unspecified: Secondary | ICD-10-CM

## 2018-09-15 DIAGNOSIS — I5043 Acute on chronic combined systolic (congestive) and diastolic (congestive) heart failure: Secondary | ICD-10-CM

## 2018-09-15 MED ORDER — FUROSEMIDE 80 MG PO TABS: 80.0000 mg | ORAL_TABLET | Freq: Two times a day (BID) | ORAL | 1 refills | Status: AC

## 2018-09-15 MED ORDER — SENNA 8.6 MG PO TABS: 1.0000 | ORAL_TABLET | Freq: Two times a day (BID) | ORAL | 0 refills | Status: AC

## 2018-09-15 MED ORDER — LORAZEPAM 1 MG PO TABS: 1.0000 mg | ORAL_TABLET | ORAL | 0 refills | Status: AC | PRN

## 2018-09-15 MED ORDER — MORPHINE SULFATE (CONCENTRATE) 10 MG/0.5ML PO SOLN: 5.0000 mg | ORAL | 0 refills | Status: AC | PRN

## 2018-09-15 NOTE — TOC Initial Note (Signed)
Transition of Care [facility]) - Initial/Assessment Note    Patient Details  Name: [name] MRN: [id number] Date of Birth: February 28, 1930  Transition [facility]) CM/SW Contact:    Bartholomew Crews, RN Phone Number: [phone] 09/15/2018, 12:16 PM  Clinical Narrative:                 Spoke with patient at the bedside. PTA home with son. States there is an extra room at her son's where her daughter can stay. Discussed hospice choice. Stated that her spouse passed away at [facility], and she liked them. Patient states that she has some DME, but does not want a hospital bed stating her bed is new and much more comfortable than the bed she is in now. Patient agreed for CM to call her son, [name]. S/w Lonnie on the telephone briefly - he agreed with plan for home with hospice and referral to [facility]. Referral to [facility] placed. CM to continue to follow for transition of care needs.   Expected Discharge Plan: Home w Hospice Care Barriers to Discharge: Continued Medical Work up   Patient Goals and CMS Choice   CMS Medicare.gov Compare Post Acute Care list provided to:: Patient Choice offered to / list presented to : Patient, Adult Children  Expected Discharge Plan and Services Expected Discharge Plan: Home w Hospice Care In-house Referral: Hospice / Palliative Care Discharge Planning Services: CM Consult Post Acute Care Choice: Hospice Living arrangements for the past 2 months: Single Family Home Expected Discharge Date: 09/15/18               DME Arranged: N/A DME Agency: NA       HH Arranged: NA HH Agency: NA        Prior Living Arrangements/Services Living arrangements for the past 2 months: Single Family Home Lives with:: Self, Adult Children Patient language and need for interpreter reviewed:: Yes        Need for Family Participation in Patient Care: Yes (Comment) Care giver support system in place?: Yes (comment) Current home services: DME Criminal Activity/Legal Involvement  Pertinent to Current Situation/Hospitalization: No - Comment as needed  Activities of Daily Living Home Assistive Devices/Equipment: Environmental consultant (specify type)(four wheels) ADL Screening (condition at time of admission) Patient's cognitive ability adequate to safely complete daily activities?: Yes Is the patient deaf or have difficulty hearing?: No Does the patient have difficulty seeing, even when wearing glasses/contacts?: Yes(problems in right side of eye) Does the patient have difficulty concentrating, remembering, or making decisions?: No Patient able to express need for assistance with ADLs?: No Does the patient have difficulty dressing or bathing?: No Independently performs ADLs?: Yes (appropriate for developmental age) Does the patient have difficulty walking or climbing stairs?: Yes Weakness of Legs: Both Weakness of Arms/Hands: None  Permission Sought/Granted      Share Information with NAME: [name]     Permission granted to share info w Relationship: son     Emotional Assessment Appearance:: Appears stated age Attitude/Demeanor/Rapport: Engaged Affect (typically observed): Accepting Orientation: : Oriented to Self, Oriented to Place, Oriented to  Time, Oriented to Situation Alcohol / Substance Use: Not Applicable Psych Involvement: No (comment)  Admission diagnosis:  NSTEMI (non-ST elevated myocardial infarction) [facility]) [I21.4] Patient Active Problem List   Diagnosis Date Noted  . Atrial flutter ([place]) 09/15/2018  . CKD (chronic kidney disease), stage III ([place]) 09/15/2018  . Anemia, unspecified 09/15/2018  . Shortness of breath   . Palliative care by specialist   .  Advanced care planning/counseling discussion   . Terminal care   . Acute on chronic combined systolic and diastolic CHF (congestive heart failure) ([place])   . NSTEMI (non-ST elevated myocardial infarction) ([place]) 09/13/2018  . ST elevation myocardial infarction involving left main coronary artery ([place])    . Cardiogenic shock ([place])   . Sustained SVT ([place])   . CAD (coronary artery disease)   . PAF (paroxysmal atrial fibrillation) ([place]) 02/08/2018  . Aortic stenosis 02/08/2018  . Chronic combined systolic and diastolic heart failure ([place]) 02/07/2018  . Left main coronary artery disease 02/07/2018  . History of hip fracture 01/05/2018  . History of non-ST elevation myocardial infarction (NSTEMI) 05/05/2017  . Occult blood positive stool   . Pressure injury of skin 03/06/2017  . Closed right hip fracture ([place]) 03/01/2017  . Carotid stenosis, asymptomatic, bilateral 01/12/2017  . Osteoporosis 07/19/2016  . History of basal cell cancer 07/12/2016  . Seborrheic dermatitis 07/12/2016  . Allergic rhinitis 07/12/2016  . Falls frequently 11/03/2015  . Right humeral fracture 11/03/2015  . Systolic murmur 05/39/7673  . Counseling regarding end of life decision making 02/20/2015  . Hypertension complicating diabetes ([place]) 02/20/2015  . Microalbuminuria due to type 2 diabetes mellitus ([place]) 11/06/2014  . Hematuria 11/04/2014  . Hx of partial thyroidectomy 11/04/2014  . Chronic constipation 11/04/2014  . PAD (peripheral artery disease) ([place]) 12/06/2011  . Claudication, intermittent ([place]) 12/06/2011  . Chronic total occlusion of artery of the extremities ([place]) 09/15/2011  . Hyperlipidemia 10/08/2007  . Diabetic peripheral neuropathy associated with type 2 diabetes mellitus ([place]) 05/11/2007  . CALLUSES, FEET, BILATERAL 05/11/2007  . Type 2 diabetes mellitus with neurological complications ([place]) 41/93/7902  . GERD 09/25/2006  . HIATAL HERNIA 09/25/2006   PCP:  Jinny Sanders, MD Pharmacy:   [facility] [address] [place]), [place] - [address] [place] [address] Phone: [phone] Fax: [phone]     Social Determinants of Health (SDOH) Interventions    Readmission Risk Interventions No flowsheet data found.

## 2018-09-15 NOTE — Progress Notes (Signed)
Discussed with Dr. Radford Pax. Plan to change IV lasix to PO. 80mg  BID.

## 2018-09-15 NOTE — Discharge Summary (Addendum)
Discharge Summary    Patient ID: [name],  MRN: [id number], DOB/AGE: 07-27-1929 85 y.o.  Admit date: 09/13/2018 Discharge date: 09/15/2018  Primary Care Provider: Jinny [name] Primary Cardiologist: [name] Mocha, MD Primary Electrophysiologist:  None  Discharge Diagnoses    Principal Problem:   NSTEMI (non-ST elevated myocardial infarction) [name]) Active Problems:   CAD (coronary artery disease)   Diabetic peripheral neuropathy associated with type 2 diabetes mellitus ([place])   Hyperlipidemia   Falls frequently   Aortic stenosis   Cardiogenic shock (HCC)   Sustained SVT ([place])   Shortness of breath   Palliative care by specialist   Advanced care planning/counseling discussion   Terminal care   Acute on chronic combined systolic and diastolic CHF (congestive heart failure) ([place])   Atrial flutter ([place])   CKD (chronic kidney disease), stage III ([place])   Anemia, unspecified    Diagnostic Studies/Procedures    1. Cardiac catheterization this admission, please see full report and below for summary.  2. 2D echo 09/14/18 IMPRESSIONS  1. The left ventricle has moderately reduced systolic function, with an ejection fraction of 35-40%. The cavity size was mildly dilated. Indeterminate diastolic filling due to E-A fusion.  2. Diffuse hypokinesis with anterior akinesis. Compared to echo from 08/24/18 there is a significant decline in LVEF.  3. The right ventricle has normal systolic function. The cavity was normal. There is no increase in right ventricular wall thickness.  4. Left atrial size was severely dilated.  5. Right atrial size was mildly dilated.  6. The mitral valve is abnormal. Mild thickening of the mitral valve leaflet. Mild calcification of the mitral valve leaflet. There is mild mitral annular calcification present. Mitral valve regurgitation is severe by color flow Doppler. The MR jet is  posteriorly-directed.  7. Tricuspid valve regurgitation is severe.  8. The  aortic valve is abnormal. Moderate thickening of the aortic valve. Moderate calcification of the aortic valve. Aortic valve regurgitation is mild by color flow Doppler.  9. AV is thickened, calcified with restricted motion. Peak and mean gradients through the AV are 21 and 12 mm Hg respectively 2 D imaging suggests stenosis is moderate to severe LVOT to AV VTI ratio is 0.32 consistent with moderate AS. 10. The aortic root and ascending aorta are normal in size and structure. 11. The inferior vena cava was dilated in size with <50% respiratory variability. 12. The interatrial septum was not assessed. _____________     History of Present Illness     [name] is a 85 y.o. female with history of CAD, chronic combined CHF, aortic stenosis, poor functional capacity, fibromyalgia, diverticulosis, diaphragmatic hernia, Nissen fundoplication, HLD, IBS, CKD III, DM, thyroid disease who presented to [facility] with NSTEMI.   The patient had non-STEMI and heart failure that occurred during a hospitalization for a hip fracture in 2019. At the time she was not well enough to undergo any invasive cardiac evaluation and she was treated medically. She returned later in the year with ongoing symptoms of chest discomfort, exercise intolerance, weakness, and shortness of breath. She underwent a repeat echocardiogram which showed improvement in LV function. She underwent cardiac catheterization that demonstrated critical left main stenosis and chronic occlusion of the RCA. She was turned down for heart surgery because of advanced age and poor functional capacity. She underwent atherectomy with Impella hemodynamic support and left main bifurcation stenting in November 2019. Her procedure was uncomplicated and she recovered quickly.   Patient returned to the  ER with chest pain in 2/20, EKG showedelevation in aVR and diffuse ST depression concerning forglobal ischemia. EKG patten concerning for critical LM disease.  She was given ASA and started on IV heparin. CODE STEMI was activated and pt was taken to the [facility] Cath lab for emergent catheterization.In the cath lab, she was found to have recurrent critical left main stenosis (in-stent restenosis), 95+%. RCA was chronically occluded. She had Impella placement followed by successful PCI to left main. Echo was initially 20% but improved to 50-55% on repeat echo. Impella was removed successfully. Recently she has been followed for decompensated heart failure.  She was readmitted 09/13/2018 with chest heaviness that felt like prior MI as well as dyspnea and orthopnea. She ruled in for NSTEMI. Covid negative.  ECG showed lateral and anterolateral ST depression with slight ST elevation in AVR. Changes were not as marked as in 2/20, but different than 5/20 ECG. She was admitted for further management.   Hospital Course    She was started on heparin gtt and Lasix IV as she was volume overloaded on exam. During admission she went into SVT, possibly atrial flutter, associated with dyspnea and was started on amiodarone. Troponin peaked at 6.96. She developed concerns for cardiogenic shock due to SBP 80s-90s and rise in creatinine. She underwent cardiac cath showing recurrent restenosis within previously placed left main stent, new ostial LM disease, high grade obstruction in LAD, ISR within Cx stent, and CTO of RCA. Per cath note, "After review of findings in context of prior interventional history, declaration that further intervention would be futile was made to the patient and family. " 2D echo showed EF 35-40%, diffuse HK, severe LAE, mildly dilated RA, severe TR,  Mild AI, moderate-severe aortic stenosis. Palliative care was consulted given end stage CAD. She lives with her son. Her son and daughter agreed they could provide 24 hour care. She was made a DNR with recommendations for home with hospice. I spoke with Dr. Domingo [name] with PMT to clarify appropriate prescriptions and  amounts below. They also started Senokot and pyridium. Pyridium was not prescribed due to contraindication with renal insufficiency but senokot was continued short term. Will defer to hospice team to re-eval as OP. PMPD reviewed without any inappropriate fills - last in 2019. Her prognosis is felt to be less than 4 weeks. Other issues identified include leukocytosis, anemia, and renal insufficiency consistent with CKD stage III.   She is off all cardiac meds except Lasix and Brilinta at this point. Dr. Radford Pax has seen and examined the patient today and feels she is stable for discharge for home hospice.  _____________  Discharge Vitals Blood pressure 97/68, pulse 83, temperature (!) 97.5 F (36.4 C), temperature source Oral, resp. rate (!) 26, height 5\' 3"  (1.6 m), weight 61.6 kg, SpO2 98 %.  Filed Weights   09/13/18 1847 09/13/18 2336 09/15/18 0513  Weight: 60.6 kg 62.9 kg 61.6 kg    Labs & Radiologic Studies    CBC Recent Labs    09/13/18 1910 09/14/18 0418  WBC 14.3* 13.9*  HGB 11.0* 10.9*  HCT 33.8* 33.8*  MCV 82.6 81.8  PLT 224 700   Basic Metabolic Panel Recent Labs    09/13/18 1910 09/14/18 0418  NA 137 138  K 3.9 5.0  CL 106 103  CO2 20* 20*  GLUCOSE 174* 113*  BUN 31* 37*  CREATININE 1.21* 1.41*  CALCIUM 8.6* 9.4   Cardiac Enzymes Recent Labs    09/14/18 0015 09/14/18  0418 09/14/18 1017  TROPONINI 3.60* 5.31* 6.96*   Hemoglobin A1C Recent Labs    09/14/18 0418  HGBA1C 5.8*   Fasting Lipid Panel Recent Labs    09/14/18 0418  CHOL 109  HDL 42  LDLCALC 48  TRIG 96  CHOLHDL 2.6  ____________  Dg Chest Port 1 View  Result Date: 09/13/2018 CLINICAL DATA:  85 year old female with chest pain since 1630 hours. EXAM: PORTABLE CHEST 1 VIEW COMPARISON:  05/21/2018 and earlier. FINDINGS: Portable AP upright view at 1917 hours. Stable lung volumes and mediastinal contours. Coarse chronic bilateral pulmonary interstitial opacity. No superimposed  pneumothorax, pleural effusion or acute pulmonary opacity. Osteopenia. Stable visualized osseous structures. IMPRESSION: Stable chronic lung disease.  No acute cardiopulmonary abnormality. Electronically Signed   By: Genevie Ann M.D.   On: 09/13/2018 19:39   [place] Ekg Site Rite  Result Date: 09/14/2018 If Site Rite image not attached, placement could not be confirmed due to current cardiac rhythm.  Disposition   Pt is being discharged home today in good condition.  Follow-up Plans & Appointments    Follow-up Information    [name] Mocha, MD Follow up.   Specialty:  Cardiology Why:  Please call our office if we can be of any assistance. Contact information: [address] [address] [phone]          Discharge Instructions    Diet - low sodium heart healthy   Complete by:  As directed    Discharge instructions   Complete by:  As directed    No lifting over 5 lbs for 1 week. Keep cath procedure site clean & dry. If you notice increased pain, swelling, bleeding or pus, call/return!  You may shower, but no soaking baths/hot tubs/pools for 1 week.      Discharge Medications   Allergies as of 09/15/2018      Reactions   Metformin Other (See Comments)   Reaction not recalled   Penicillins Other (See Comments)   From childhood- not recalled Has patient had a PCN reaction causing immediate rash, facial/tongue/throat swelling, SOB or lightheadedness with hypotension: Unk Has patient had a PCN reaction causing severe rash involving mucus membranes or skin necrosis: Unk Has patient had a PCN reaction that required hospitalization: No Has patient had a PCN reaction occurring within the last 10 years: No If all of the above answers are "NO", then may proceed with Cephalosporin use.   Adhesive [tape] Rash   Codeine Palpitations   Rapid heart rate   Fenofibrate Palpitations   Latex Rash   CANNOT USE ANYTHING LATEX   Pioglitazone Rash      Medication  List    STOP taking these medications   acetaminophen 500 MG tablet Commonly known as:  TYLENOL   aspirin 81 MG tablet   atorvastatin 80 MG tablet Commonly known as:  LIPITOR   carvedilol 6.25 MG tablet Commonly known as:  COREG   nitroGLYCERIN 0.4 MG SL tablet Commonly known as:  Nitrostat   NovoLIN N ReliOn 100 UNIT/ML injection Generic drug:  insulin NPH Human   NovoLOG FlexPen 100 UNIT/ML FlexPen Generic drug:  insulin aspart   ONE TOUCH ULTRA TEST test strip Generic drug:  glucose blood   potassium chloride 10 MEQ tablet Commonly known as:  K-DUR     TAKE these medications   furosemide 80 MG tablet Commonly known as:  LASIX Take 1 tablet (80 mg total) by mouth 2 (two) times daily. What changed:  medication strength  how much to take   LORazepam 1 MG tablet Commonly known as:  ATIVAN Take 1 tablet (1 mg total) by mouth every 4 (four) hours as needed for anxiety or sleep (Please offer at bedtime for sleep).   morphine CONCENTRATE 10 MG/0.5ML Soln concentrated solution Place 0.25 mLs (5 mg total) under the tongue every hour as needed for moderate pain, severe pain or shortness of breath.   pantoprazole 40 MG tablet Commonly known as:  PROTONIX Take 1 tablet (40 mg total) by mouth 2 (two) times daily.   senna 8.6 MG Tabs tablet Commonly known as:  SENOKOT Take 1 tablet (8.6 mg total) by mouth 2 (two) times a day.   THERATEARS OP Place 1 drop into both eyes as needed (for dryness).   ticagrelor 90 MG Tabs tablet Commonly known as:  BRILINTA Take 1 tablet (90 mg total) by mouth 2 (two) times daily.        Allergies:  Allergies  Allergen Reactions   Metformin Other (See Comments)    Reaction not recalled   Penicillins Other (See Comments)    From childhood- not recalled Has patient had a PCN reaction causing immediate rash, facial/tongue/throat swelling, SOB or lightheadedness with hypotension: Unk Has patient had a PCN reaction causing  severe rash involving mucus membranes or skin necrosis: Unk Has patient had a PCN reaction that required hospitalization: No Has patient had a PCN reaction occurring within the last 10 years: No If all of the above answers are "NO", then may proceed with Cephalosporin use.    Adhesive [Tape] Rash   Codeine Palpitations    Rapid heart rate   Fenofibrate Palpitations   Latex Rash    CANNOT USE ANYTHING LATEX   Pioglitazone Rash    Aspirin prescribed at discharge?  No: palliative medicine only - simplifying meds for comfort High Intensity Statin Prescribed? (Lipitor 40-80mg  or Crestor 20-40mg ): No: palliative medicine only - simplifying meds for comfort Beta Blocker Prescribed? No - hypotension/cardiogenic shock, palliative medicine only - simplifying meds for comfort For EF <40%, was ACEI/ARB Prescribed? No - hypotension, palliative medicine only - simplifying meds for comfort ADP Receptor Inhibitor Prescribed? (i.e. Plavix etc.-Includes Medically Managed Patients): yes For EF <40%, Aldosterone Inhibitor Prescribed? No - hypotension, palliative medicine only - simplifying meds for comfort Was EF assessed during THIS hospitalization? yes Was Cardiac Rehab II ordered? No, not appropriate given hospice   Outstanding Labs/Studies   N/A  Duration of Discharge Encounter   Greater than 30 minutes including physician time.  Signed, Charlie Pitter PA-C 09/15/2018, 12:15 PM

## 2018-09-15 NOTE — TOC Transition Note (Signed)
Transition of Care [facility]) - CM/SW Discharge Note   Patient Details  Name: [name] MRN: [id number] Date of Birth: 1929-06-04  [facility] CM/SW Contact:  Bartholomew Crews, RN Phone Number: [phone] 09/15/2018, 4:11 PM   Clinical Narrative:    Received call from [facility] that patient was eligible for hospice services. Noted order for discharge today. ACC aware and will plan on family meeting tomorrow afternoon preferably, but latest would be Monday. Telephone call to [name] - patient's daughter - about transportation home. [name] states that she will pick her mom up. Bedside RN aware. Printed scripts provided by MD. No other transition of care needs identified.    Final next level of care: Home w Hospice Care Barriers to Discharge: No Barriers Identified   Patient Goals and CMS Choice Patient states their goals for this hospitalization and ongoing recovery are:: I want to go home CMS Medicare.gov Compare Post Acute Care list provided to:: Patient Choice offered to / list presented to : Patient, Adult Children  Discharge Placement                       Discharge Plan and Services In-house Referral: Hospice / Palliative Care Discharge Planning Services: CM Consult Post Acute Care Choice: Hospice          DME Arranged: N/A DME Agency: NA       HH Arranged: NA HH Agency: NA        Social Determinants of Health (SDOH) Interventions     Readmission Risk Interventions No flowsheet data found.

## 2018-09-15 NOTE — Progress Notes (Addendum)
Progress Note  Patient Name: [name] Date of Encounter: 09/15/2018  Primary Cardiologist: Sherren Mocha, MD   Subjective   Was seen by Palliative Care yesterday and plan is to go home with hospice.  SHe is now DNR.  No further CP.    Inpatient Medications    Scheduled Meds: . furosemide  80 mg Intravenous BID  . pantoprazole  40 mg Oral BID  . phenazopyridine  100 mg Oral TID WC  . senna  1 tablet Oral BID  . sodium chloride flush  3 mL Intravenous Q12H  . sodium chloride flush  3 mL Intravenous Q12H  . ticagrelor  90 mg Oral BID   Continuous Infusions: . sodium chloride     PRN Meds: sodium chloride, acetaminophen, LORazepam **OR** LORazepam **OR** LORazepam, morphine injection, morphine CONCENTRATE **OR** morphine CONCENTRATE, ondansetron (ZOFRAN) IV, sodium chloride flush   Vital Signs    Vitals:   09/14/18 1225 09/14/18 2000 09/14/18 2007 09/15/18 0513  BP: 95/68  97/63 97/68  Pulse: (!) 108 82 78 83  Resp: (!) 24 (!) 26    Temp:   98.3 F (36.8 C) (!) 97.5 F (36.4 C)  TempSrc:   Oral Oral  SpO2: 95% 91% 91% 98%  Weight:    61.6 kg  Height:        Intake/Output Summary (Last 24 hours) at 09/15/2018 1029 Last data filed at 09/15/2018 1000 Gross per 24 hour  Intake 499.64 ml  Output 250 ml  Net 249.64 ml   Filed Weights   09/13/18 1847 09/13/18 2336 09/15/18 0513  Weight: 60.6 kg 62.9 kg 61.6 kg    Telemetry    NSR - Personally Reviewed  ECG    No new EKG to review - Personally Reviewed  Physical Exam   GEN: No acute distress.   Neck: No JVD Cardiac: RRR, no murmurs, rubs, or gallops.  Respiratory: Clear to auscultation bilaterally. GI: Soft, nontender, non-distended  MS: No edema; No deformity. Neuro:  Nonfocal  Psych: Normal affect   Labs    Chemistry Recent Labs  Lab 09/13/18 1910 09/14/18 0418  NA 137 138  K 3.9 5.0  CL 106 103  CO2 20* 20*  GLUCOSE 174* 113*  BUN 31* 37*  CREATININE 1.21* 1.41*  CALCIUM 8.6* 9.4   GFRNONAA 40* 34*  GFRAA 47* 39*  ANIONGAP 11 15     Hematology Recent Labs  Lab 09/13/18 1910 09/14/18 0418  WBC 14.3* 13.9*  RBC 4.09 4.13  HGB 11.0* 10.9*  HCT 33.8* 33.8*  MCV 82.6 81.8  MCH 26.9 26.4  MCHC 32.5 32.2  RDW 15.0 15.0  PLT 224 241    Cardiac Enzymes Recent Labs  Lab 09/13/18 2200 09/14/18 0015 09/14/18 0418 09/14/18 1017  TROPONINI 2.76* 3.60* 5.31* 6.96*   No results for input(s): TROPIPOC in the last 168 hours.   BNP Recent Labs  Lab 09/13/18 1912  BNP 710.8*     DDimer No results for input(s): DDIMER in the last 168 hours.   Radiology    Dg Chest Port 1 View  Result Date: 09/13/2018 CLINICAL DATA:  85 year old female with chest pain since 1630 hours. EXAM: PORTABLE CHEST 1 VIEW COMPARISON:  05/21/2018 and earlier. FINDINGS: Portable AP upright view at 1917 hours. Stable lung volumes and mediastinal contours. Coarse chronic bilateral pulmonary interstitial opacity. No superimposed pneumothorax, pleural effusion or acute pulmonary opacity. Osteopenia. Stable visualized osseous structures. IMPRESSION: Stable chronic lung disease.  No acute cardiopulmonary abnormality.  Electronically Signed   By: Genevie Ann M.D.   On: 09/13/2018 19:39   [place] Ekg Site Rite  Result Date: 09/14/2018 If Site Rite image not attached, placement could not be confirmed due to current cardiac rhythm.   Assessment & Plan    1. CAD - Patient presented with NSTEMI, TnI 5.3.  -She had NSTEMI in 11/19, requiring Impella-supported atherectomy and left main bifurcation stenting. She presented with STEMI in 2/20 with 95% left main in-stent restenosis. She had PCI again to the left main with Impella support. Her RCA is chronically occluded.  -no further CP -cath yesterday showed recurrent stenosis within previously placed left main stent with new ostial left main disease and high-grade obstruction of the LAD beyond the stent margin the proximal vessel.  There is in-stent  restenosis within the left circumflex greater than 80% and chronic total occlusion of the RCA.  It was felt that further intervention would be futile and patient has been made comfort care with plans to go home with hospice -She is now DNR/palliative care and off all cardiac meds except Brlinta 90mg  BID -Plan is to hopefully go home today with hospice  2. Acute on chronic ?diastolic CHF -Last echo in 5/20 with EF improved to 50-55%.  -Admission in 2/20 with left main in-stent restenossi was complicated by cardiogenic shock with EF 20%. She required Impella support and milrinone but was able to be weaned off.  -She was volume overloaded on admission.  She was short of breath when in SVT but now comfortable at rest.   - BP remains soft and she is off beta-blockers. -Echo with EF 35 to 40% with diffuse hypokinesis and akinesis of the anterior wall.  Compared to the echo last month there is been a significant decline in EF.  Associated severe mitral regurgitation. -Her I&Os do not appear accurate -Change Lasix to 80mg  BID PO  3. Valvular heart disease -last echo in 5/20 with moderate MR and moderate AS. Murmurs noted on exam.  -Repeat 2D echo this admission showed moderate aortic stenosis and severe mitral regurgitation  4. Type II diabetes:  -Cover with SSI.  5. SVT: ?atypical atrial flutter.   -Now on amiodarone gtt and back in NSR.   -She is now DNR. and hospice care and has been taken off all cardiac meds sent for Lasix  For questions or updates, please contact [place] Please consult [email or web address] for contact info under Cardiology/STEMI.      Signed, Fransico Him, MD  09/15/2018, 10:29 AM

## 2018-09-15 NOTE — Telephone Encounter (Signed)
Connected to [place] from [employer] by [employer]  Currently at [facility] Readmitted after recent problems and had non ST elevation MI Is planning to go home---hopefully today---with hospice They are asking whether Dr Diona Browner would be the attending for hospice care. She appears to be an active patient of hers, and no other doctors who would fit that role I told her yes, to put Dr Diona Browner down as the hospice attending

## 2018-09-15 NOTE — Progress Notes (Signed)
[facility]) Hospital Liaison:  RN visit  Notified by Ellard Artis [facility]) with [facility] team of patient/family request for [facility] services at home after discharge. Chart and patient information under review by [facility] physician. Hospice eligibility pending at this time by [facility] physician.    Writer spoke with Fallen Crisostomo (son) to initiate education related to hospice philosophy, services and team approach to care. [name] (son) verbalized understanding of information given. Per discussion, plan is for discharge to home by private vehicle on 09/16/2018 by the pt's daughter [name]   Please send signed and completed out of facility DNR form home with patient/family.   Patient will need prescriptions for discharge comfort medications. DME needs discussed, patient already has the following equipment: Two walkers, wheelchair, cane ad 3-1 bedside commode. Pt has a new bed and does not wish to have a hospital bed at this time. Patient/family has requested no DME equipment for delivery at this time.  Home address has been verified and is correct in the chart.  Contact person [name] (son) [phone] and [name] (daughter) [phone] who will be transporting pt home.  [facility] aware of the above. Please fax completed discharge summary to [facility] at [phone] when final. Please notify [facility] when patient is ready to leave the unit at discharge. Please call [phone] between 8:30 and 5pm. Call [phone] after 5pm. [facility] information and contact numbers given to son [name]). Above information shared with Abigail Butts [facility]) with [facility] Team.   Please call with hospice related questions.   Thank you for this referral.   Vicente Serene, BSN [place]   [place] are listed daily on AMION under Hospice and [place].

## 2018-09-15 NOTE — Progress Notes (Signed)
[facility]) Hospital Liaison: RN   Pt eligible for home with hospice services. RN has spoke with both Wendi (RNCM) and Devani Odonnel (son) with this information.    [facility]) has confirmed pt will be discharged today.   Vicente Serene, [place] [facility] Liaison [phone]  [place] are listed daily on AMION under Hospice and Palliative of [facility]

## 2018-09-17 MED FILL — Heparin Sod (Porcine)-NaCl IV Soln 1000 Unit/500ML-0.9%: INTRAVENOUS | Qty: 500 | Status: AC

## 2018-09-21 ENCOUNTER — Telehealth: Payer: Self-pay | Admitting: Family Medicine

## 2018-09-21 NOTE — Telephone Encounter (Signed)
Called daughter.  I let her know we were sad to hear of  Mrs. Emigh's death. She was a sweet lady.  Daughter states she is doing fairly well with her mood.  She voiced appreciation for the call.

## 2018-09-21 NOTE — Telephone Encounter (Signed)
Best number [phone] Rosendo Gros (daughter) called to let you know pt passed 6/11

## 2018-10-10 DEATH — deceased

## 2019-04-22 IMAGING — CR DG WRIST COMPLETE 3+V*R*
4 series · 4 of 4 positions shown · non-contrast
Comparison: None.

CLINICAL DATA: Fall with right wrist pain

EXAM:
RIGHT WRIST - COMPLETE 3+ VIEW

[wrist pa]
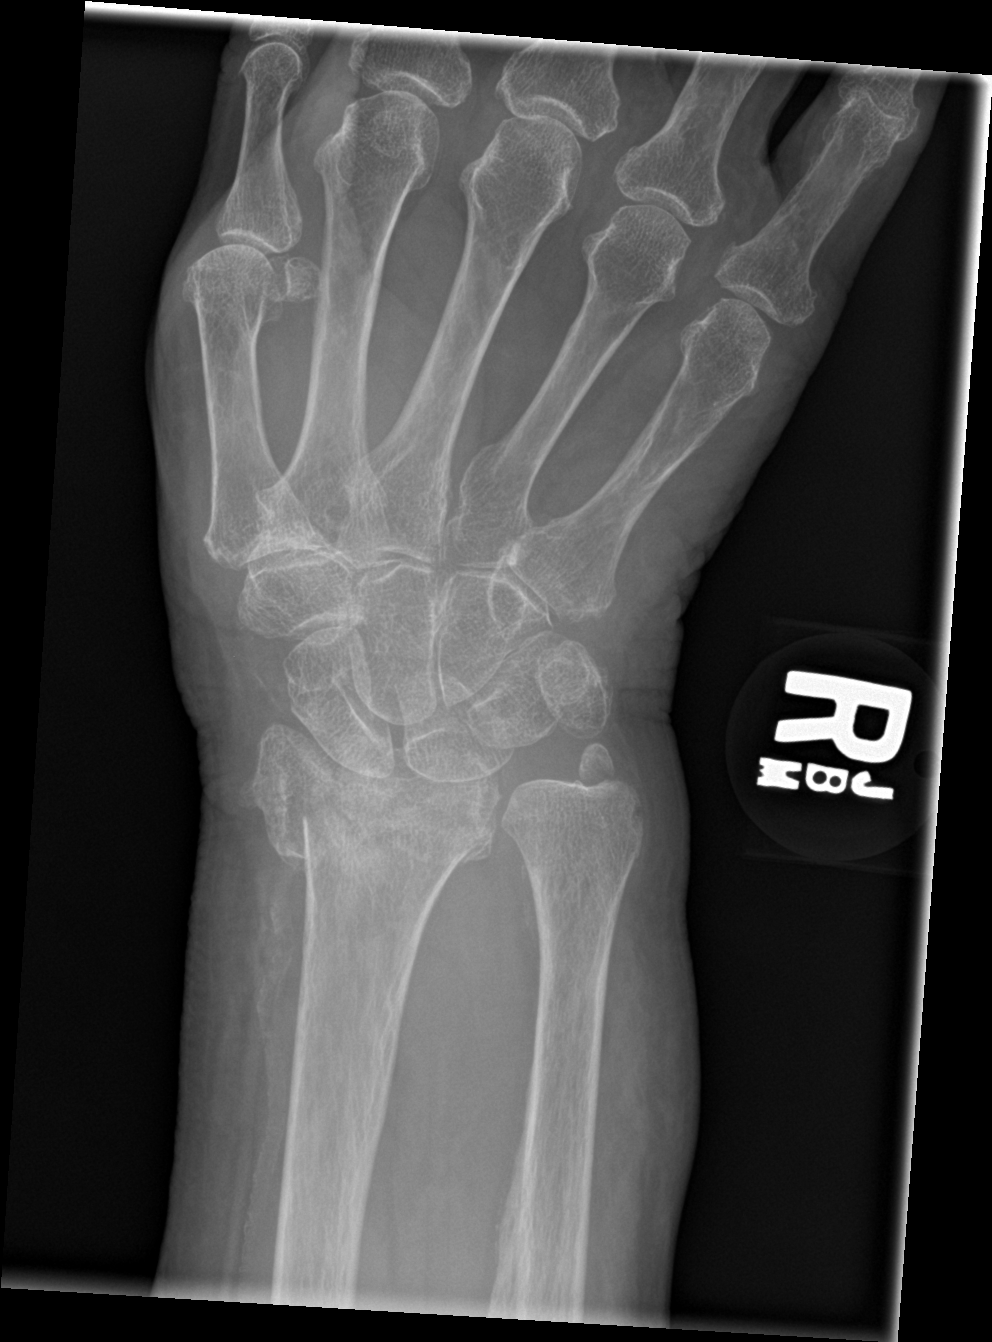

[wrist obl]
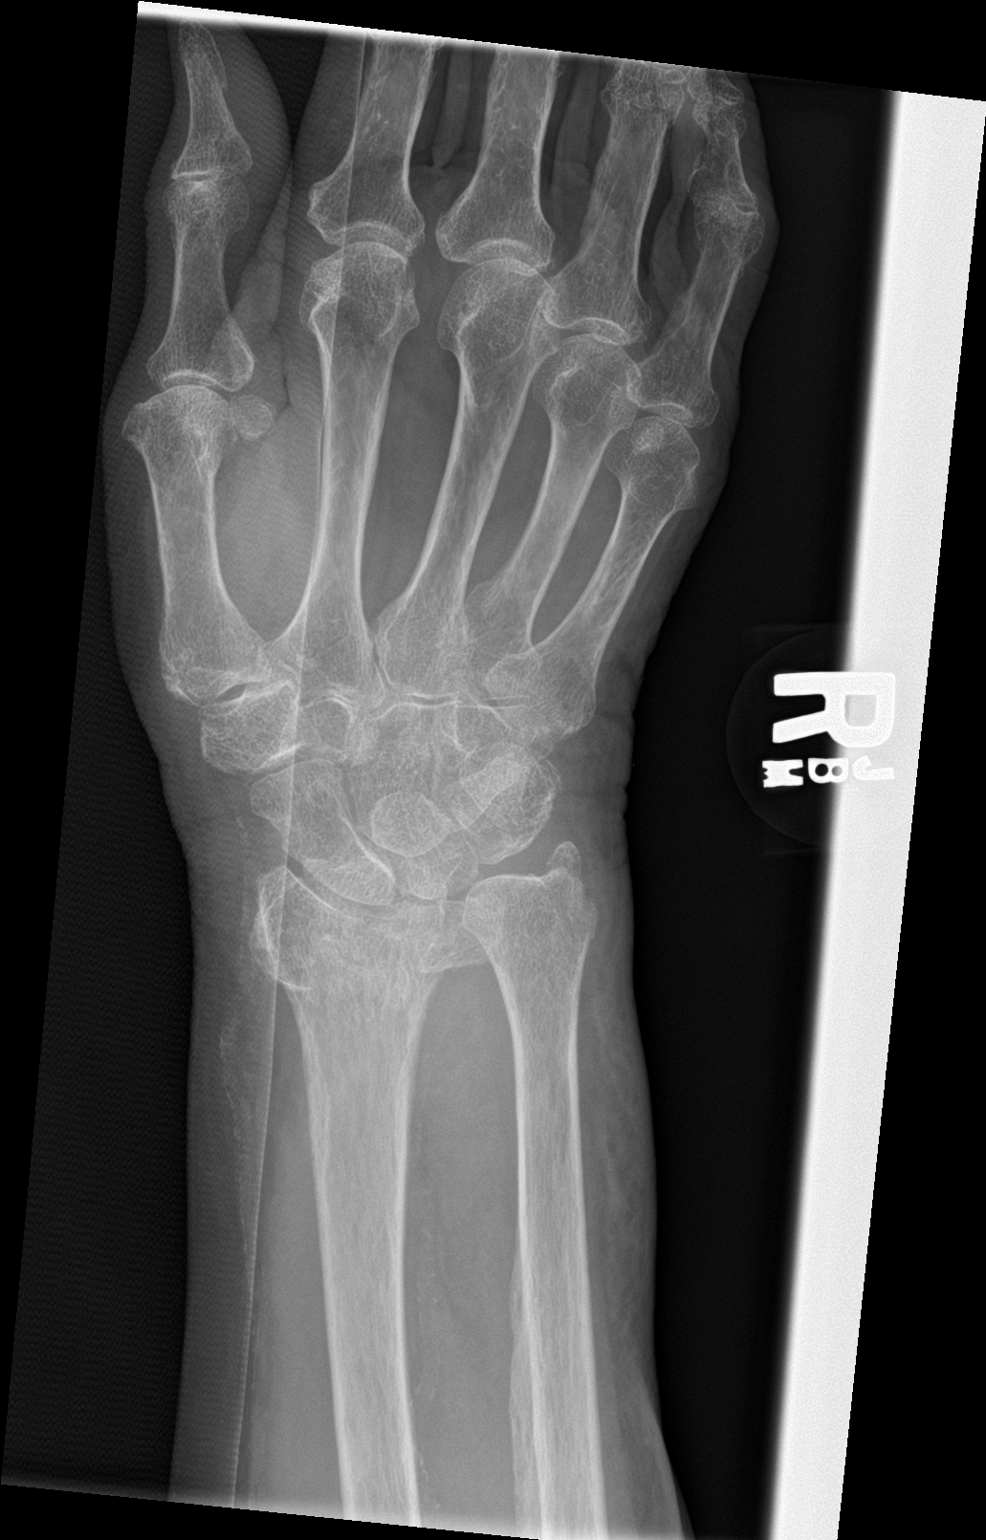

[wrist lat]
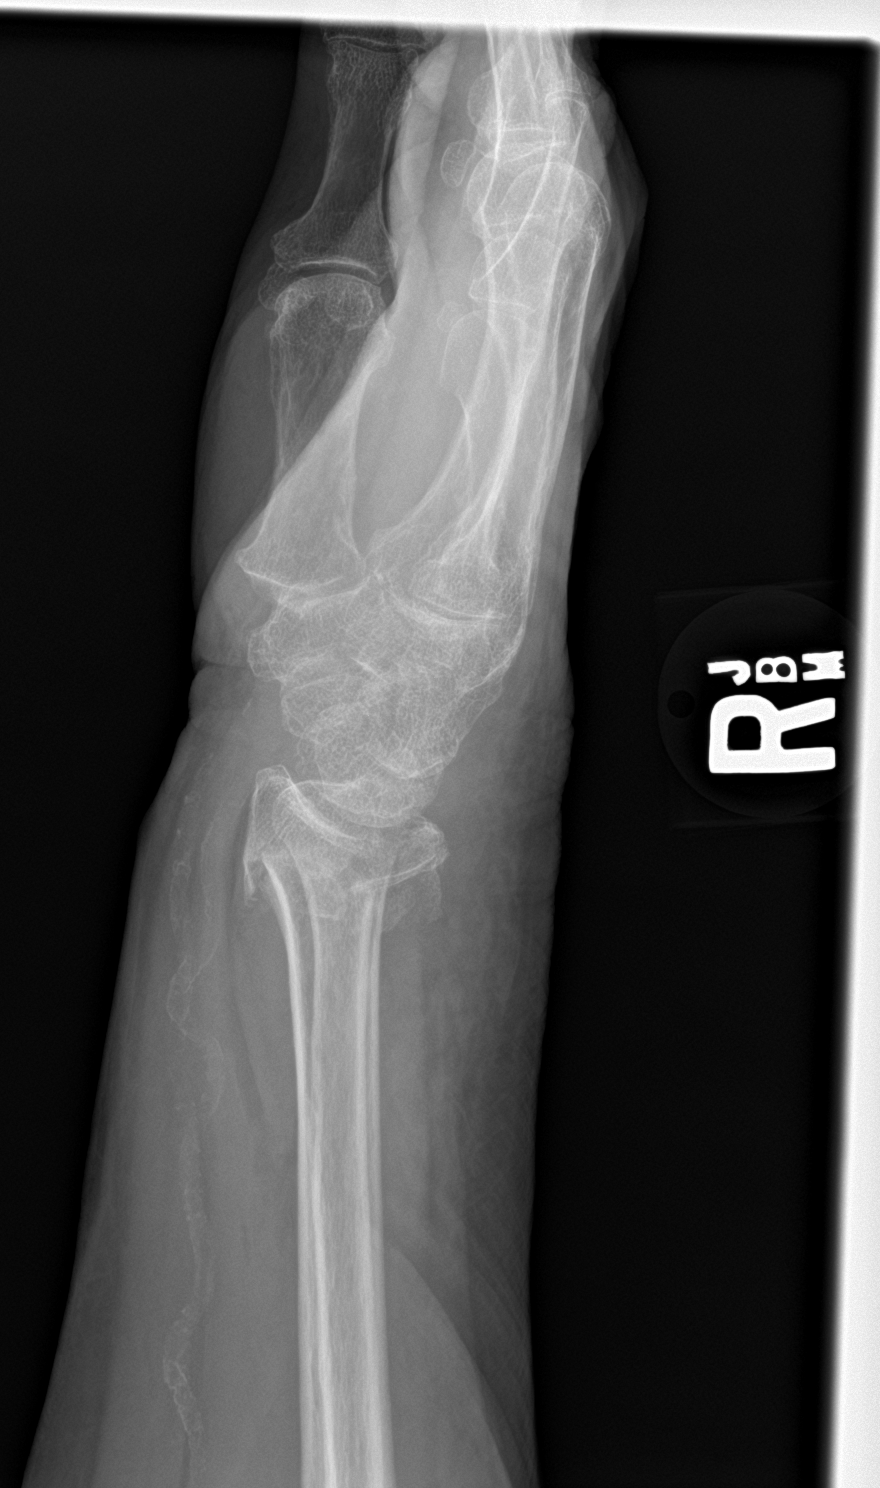

[wrist navicular]
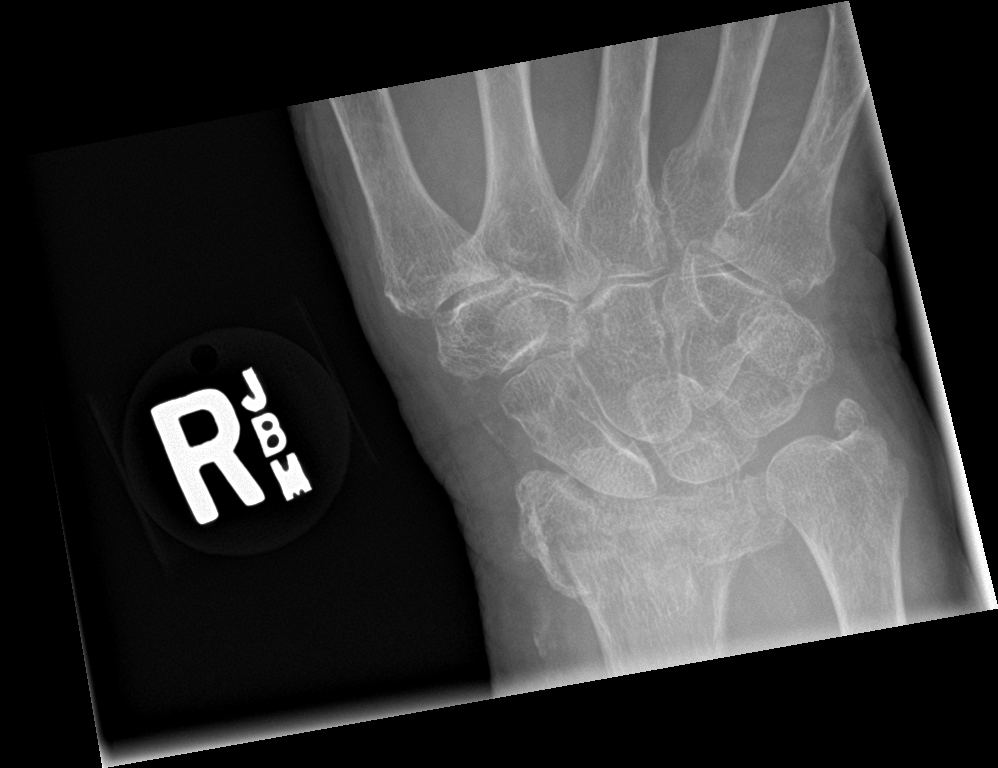

[4 of 4 positions shown; findings below may reference images not displayed]

FINDINGS: There is a impacted, comminuted fracture of the distal right radius.
There is a minimally displaced fracture of the right ulnar styloid.
There is moderate circumferential soft tissue swelling. No carpal
fracture.
IMPRESSION: Impacted, comminuted fracture of the distal right radius and
minimally displaced fracture of the ulnar styloid.

## 2019-04-22 IMAGING — CR DG HIP (WITH OR WITHOUT PELVIS) 2-3V*R*
3 series · 3 of 3 positions shown · non-contrast
Comparison: None.

CLINICAL DATA: Lateral right hip pain after a fall this morning.

EXAM:
DG HIP (WITH OR WITHOUT PELVIS) 2-3V RIGHT

[pelvis ap]
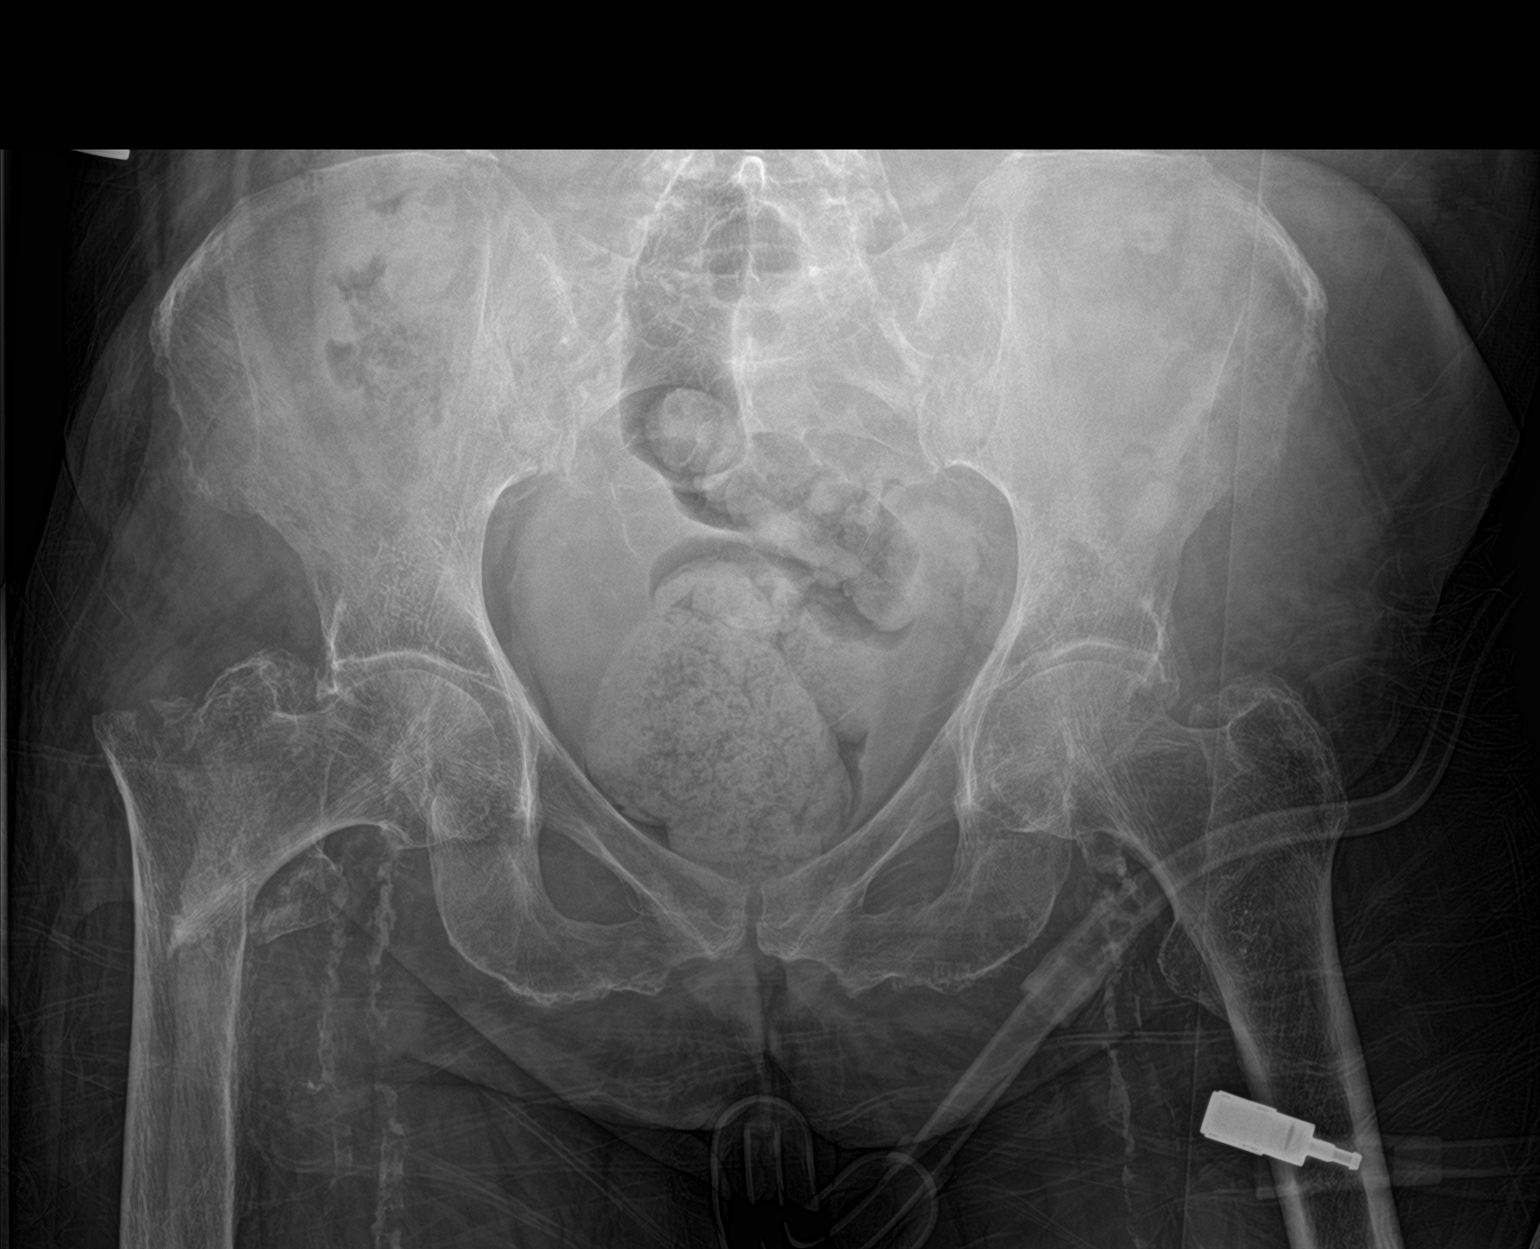

[hip ap]
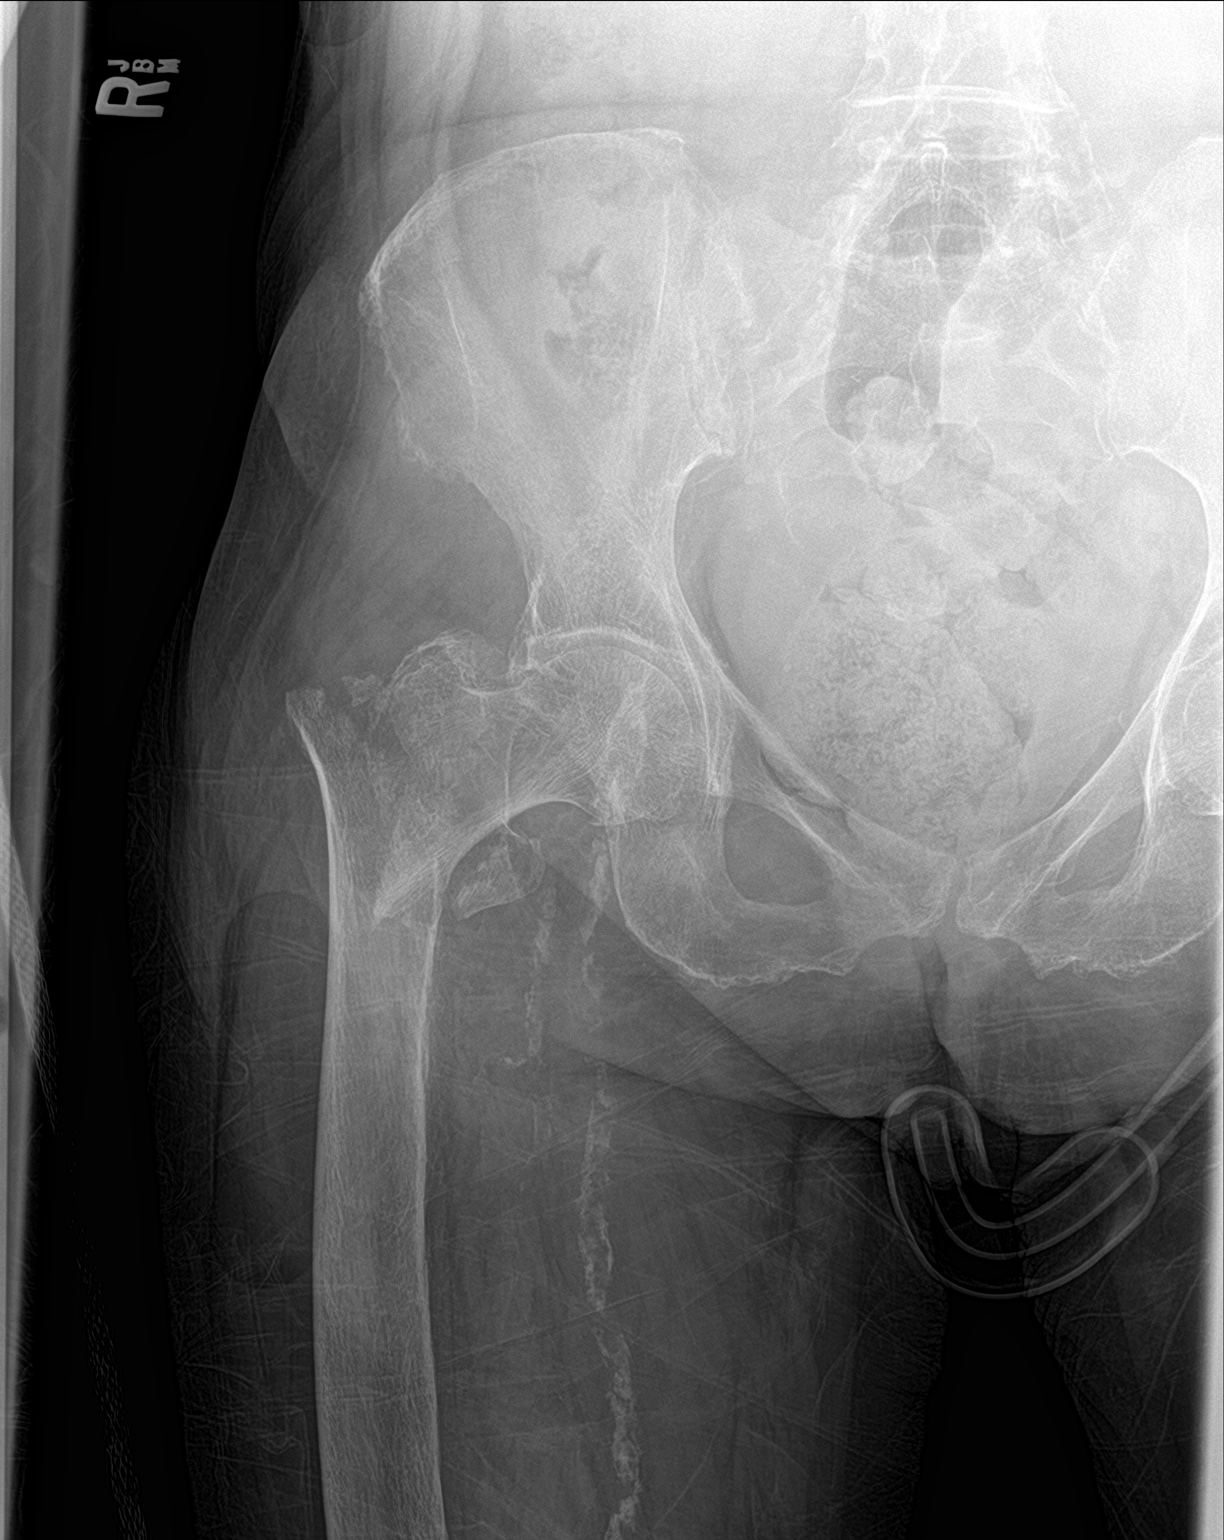

[hip x-table]
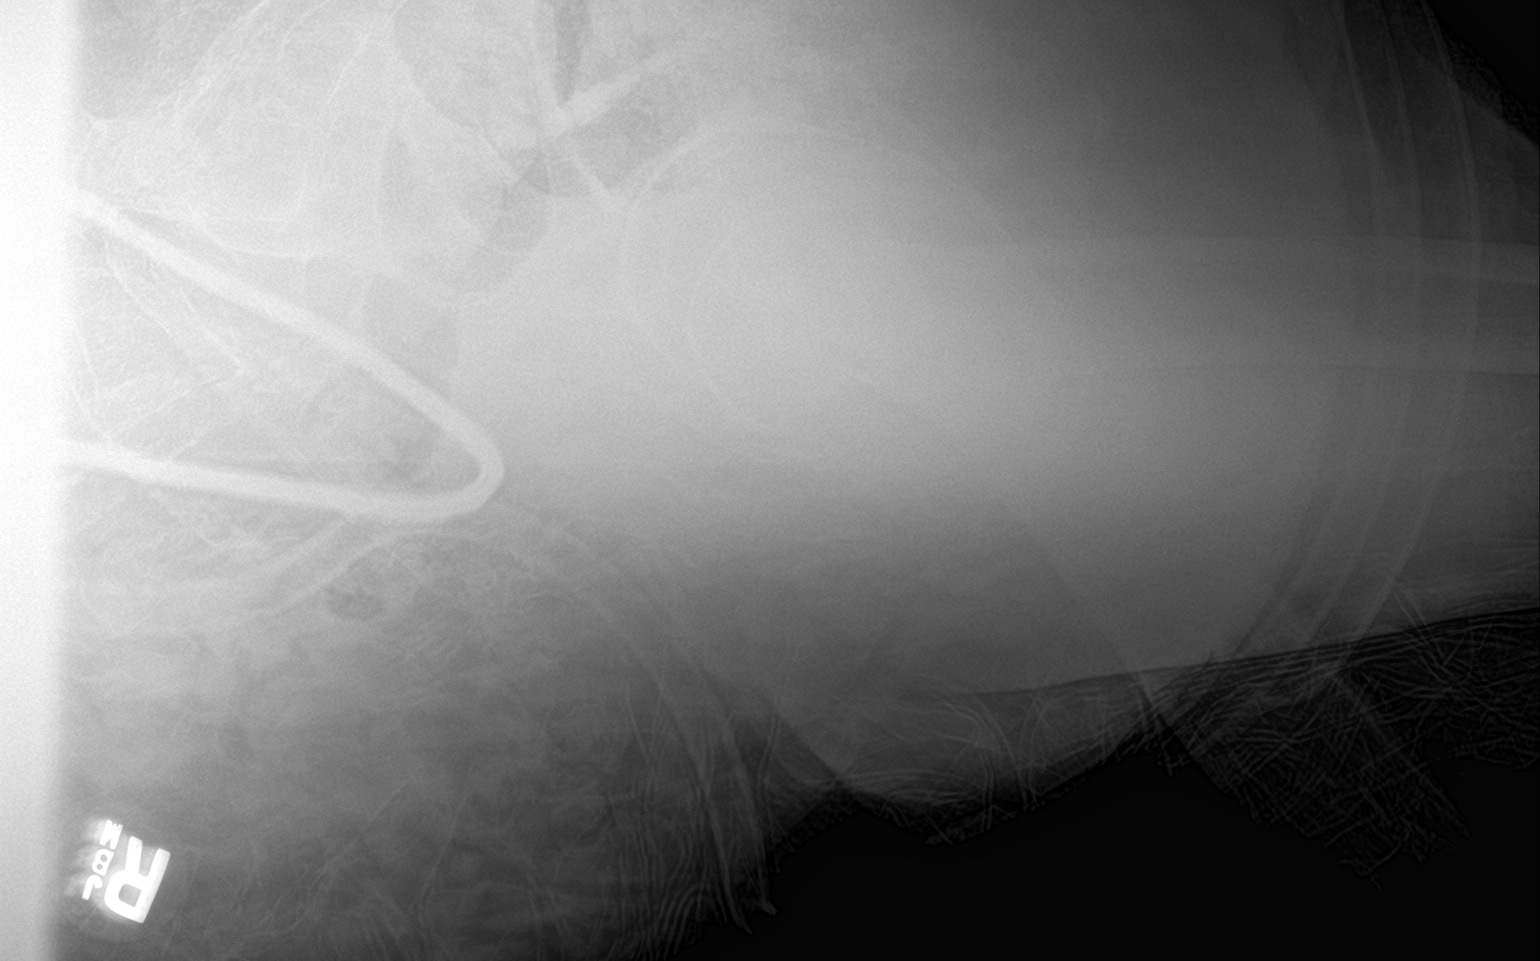

[3 of 3 positions shown; findings below may reference images not displayed]

FINDINGS: Comminuted inter trochanteric fractures of the proximal right femur
with varus angulation of the fracture fragments. Displacement of
greater and lesser trochanteric fragments. No dislocation at the hip
joint. Degenerative changes in the lower lumbar spine and in both
hips. Pelvis appears intact. SI joints and symphysis pubis are not
displaced. Prominent vascular calcifications.
IMPRESSION: Acute comminuted inter trochanteric fractures of the proximal right
femur with varus angulation.

## 2019-04-22 IMAGING — DX DG HIP (WITH OR WITHOUT PELVIS) 2-3V*R*
2 series · 3 of 3 positions shown · non-contrast
Comparison: 03/01/2017

CLINICAL DATA: Status post ORIF of right hip fracture

EXAM:
DG HIP (WITH OR WITHOUT PELVIS) 3V RIGHT

[pelvis]
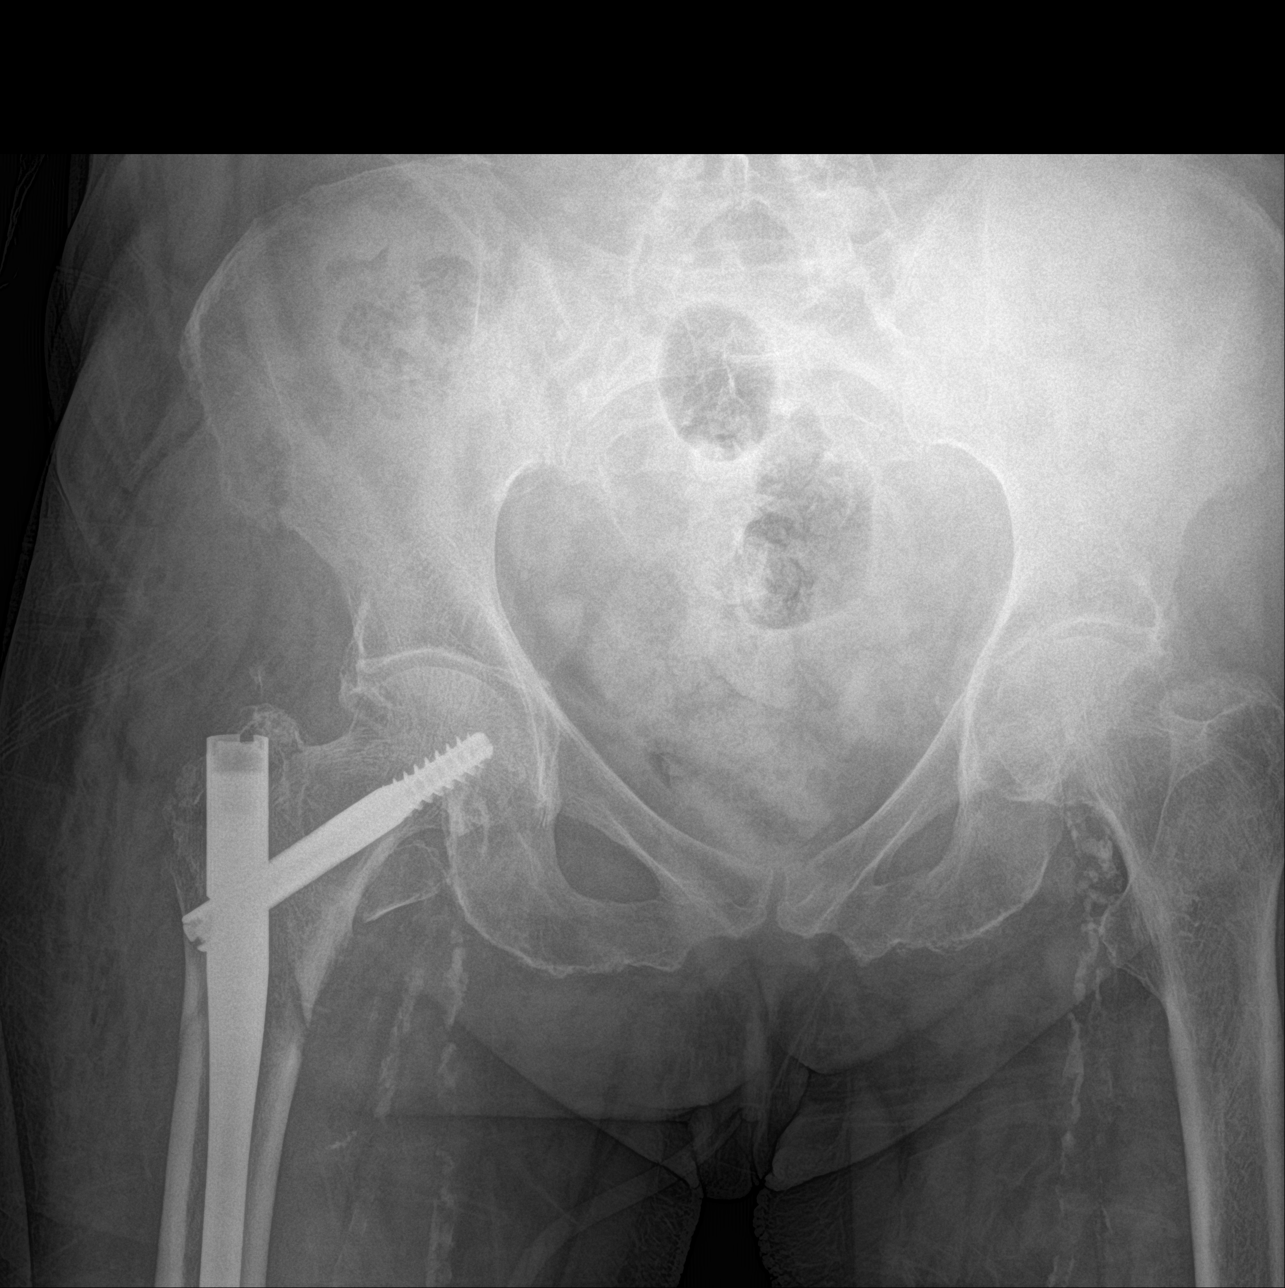

[Series 2: hip · 0.14mm/px · 2 of 2 slices shown]
[im 1/2]
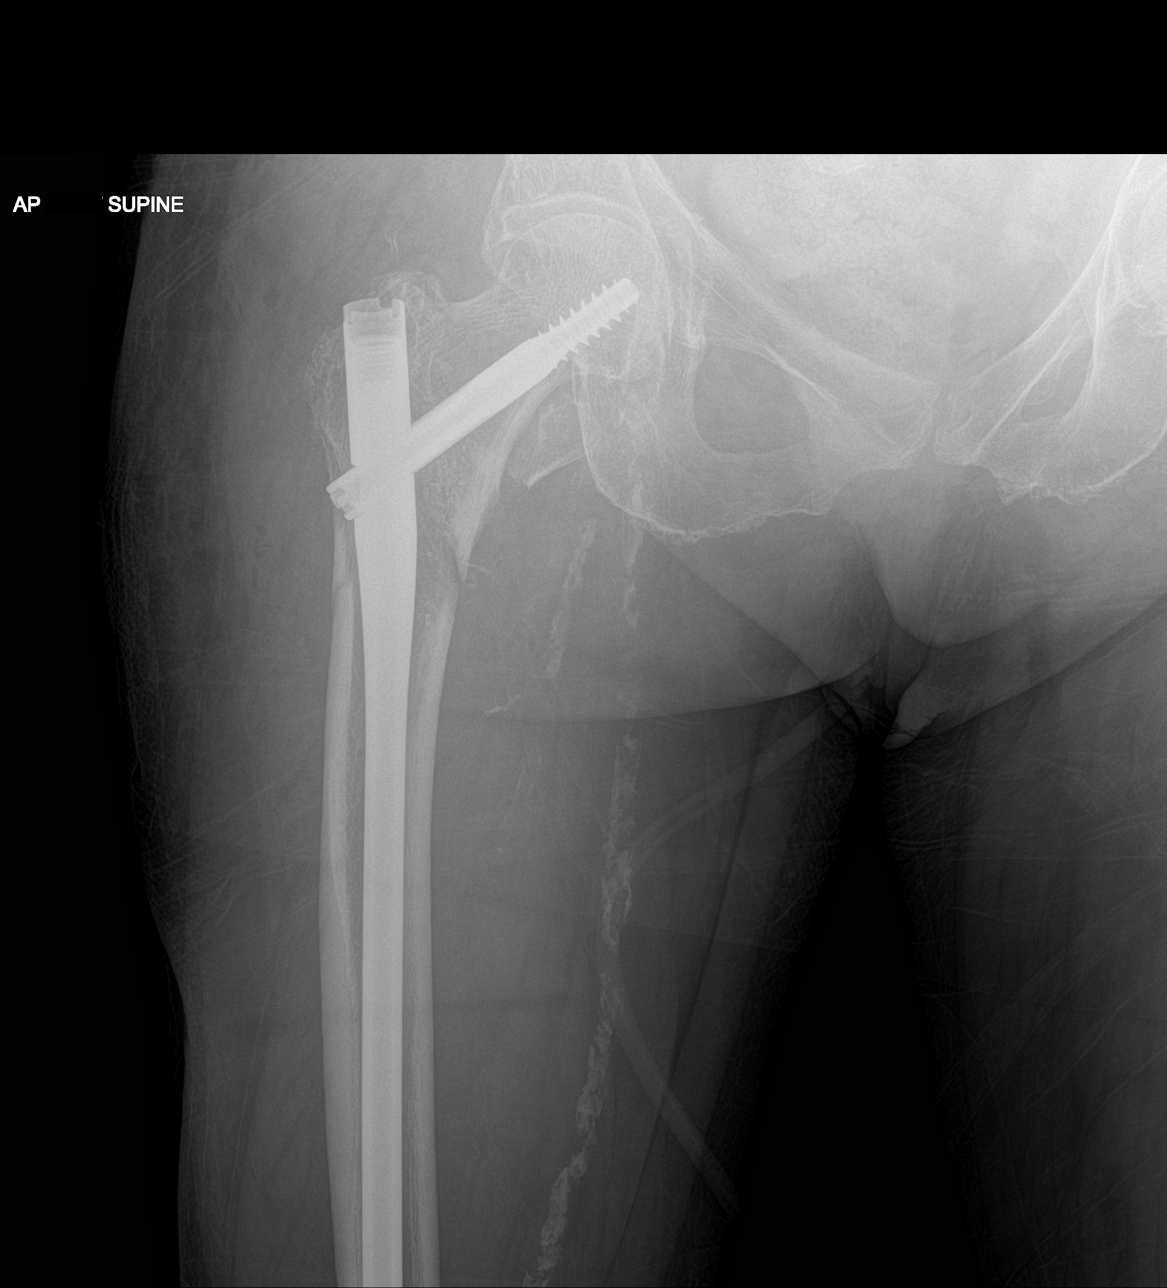
[im 2/2]
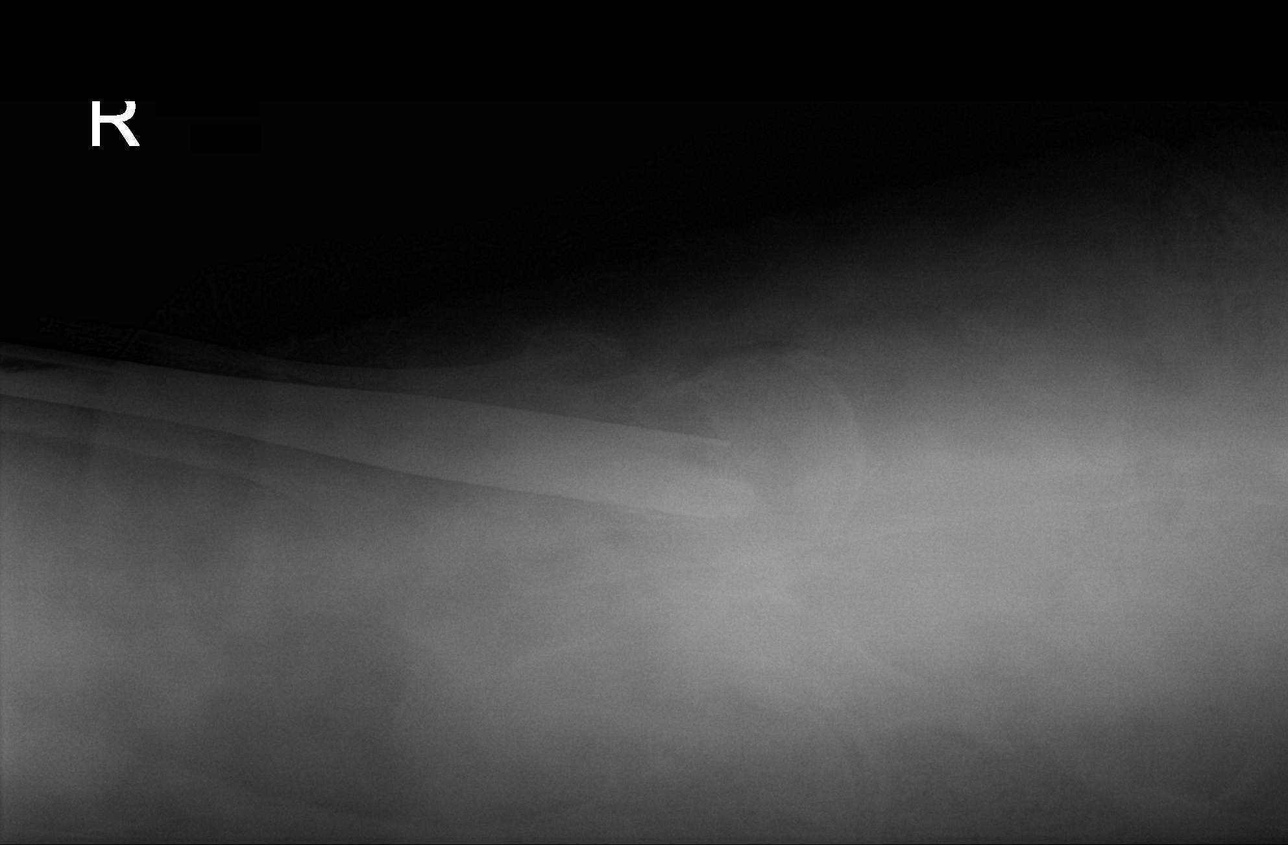

[3 of 3 positions shown; findings below may reference images not displayed]

FINDINGS: Medullary rod and compression screw are now seen in the proximal
right femur. Fracture fragments are in near anatomic alignment.
Pelvic ring is intact. No other focal abnormality is seen.
IMPRESSION: Status post ORIF of right femoral fracture
